# Patient Record
Sex: Female | Born: 1960
Health system: Southern US, Community
[De-identification: ages and names within clinical notes are randomized; demographics above are authoritative.]

## PROBLEM LIST (undated history)

## (undated) DIAGNOSIS — I509 Heart failure, unspecified: Secondary | ICD-10-CM

## (undated) DIAGNOSIS — I10 Essential (primary) hypertension: Secondary | ICD-10-CM

## (undated) DIAGNOSIS — J45909 Unspecified asthma, uncomplicated: Secondary | ICD-10-CM

## (undated) DIAGNOSIS — G4730 Sleep apnea, unspecified: Secondary | ICD-10-CM

## (undated) DIAGNOSIS — K219 Gastro-esophageal reflux disease without esophagitis: Secondary | ICD-10-CM

## (undated) DIAGNOSIS — K859 Acute pancreatitis without necrosis or infection, unspecified: Secondary | ICD-10-CM

## (undated) DIAGNOSIS — G709 Myoneural disorder, unspecified: Secondary | ICD-10-CM

## (undated) DIAGNOSIS — M329 Systemic lupus erythematosus, unspecified: Secondary | ICD-10-CM

## (undated) DIAGNOSIS — E785 Hyperlipidemia, unspecified: Secondary | ICD-10-CM

## (undated) DIAGNOSIS — J449 Chronic obstructive pulmonary disease, unspecified: Secondary | ICD-10-CM

## (undated) DIAGNOSIS — G43909 Migraine, unspecified, not intractable, without status migrainosus: Secondary | ICD-10-CM

## (undated) DIAGNOSIS — K8020 Calculus of gallbladder without cholecystitis without obstruction: Secondary | ICD-10-CM

## (undated) DIAGNOSIS — G459 Transient cerebral ischemic attack, unspecified: Secondary | ICD-10-CM

## (undated) DIAGNOSIS — IMO0002 Reserved for concepts with insufficient information to code with codable children: Secondary | ICD-10-CM

## (undated) DIAGNOSIS — E119 Type 2 diabetes mellitus without complications: Secondary | ICD-10-CM

## (undated) DIAGNOSIS — F329 Major depressive disorder, single episode, unspecified: Secondary | ICD-10-CM

## (undated) DIAGNOSIS — R0600 Dyspnea, unspecified: Secondary | ICD-10-CM

## (undated) DIAGNOSIS — M797 Fibromyalgia: Secondary | ICD-10-CM

## (undated) DIAGNOSIS — I2510 Atherosclerotic heart disease of native coronary artery without angina pectoris: Secondary | ICD-10-CM

## (undated) DIAGNOSIS — B029 Zoster without complications: Secondary | ICD-10-CM

## (undated) DIAGNOSIS — M810 Age-related osteoporosis without current pathological fracture: Secondary | ICD-10-CM

## (undated) DIAGNOSIS — G629 Polyneuropathy, unspecified: Secondary | ICD-10-CM

## (undated) DIAGNOSIS — Z973 Presence of spectacles and contact lenses: Secondary | ICD-10-CM

## (undated) DIAGNOSIS — F419 Anxiety disorder, unspecified: Secondary | ICD-10-CM

## (undated) DIAGNOSIS — I700 Atherosclerosis of aorta: Secondary | ICD-10-CM

## (undated) DIAGNOSIS — F32A Depression, unspecified: Secondary | ICD-10-CM

## (undated) DIAGNOSIS — R0902 Hypoxemia: Secondary | ICD-10-CM

## (undated) DIAGNOSIS — I639 Cerebral infarction, unspecified: Secondary | ICD-10-CM

## (undated) DIAGNOSIS — M1990 Unspecified osteoarthritis, unspecified site: Secondary | ICD-10-CM

## (undated) HISTORY — DX: Anxiety disorder, unspecified: F41.9

## (undated) HISTORY — DX: Age-related osteoporosis without current pathological fracture: M81.0

## (undated) HISTORY — DX: Fibromyalgia: M79.7

## (undated) HISTORY — DX: Migraine, unspecified, not intractable, without status migrainosus: G43.909

## (undated) HISTORY — DX: Myoneural disorder, unspecified: G70.9

## (undated) HISTORY — DX: Unspecified osteoarthritis, unspecified site: M19.90

## (undated) HISTORY — DX: Chronic obstructive pulmonary disease, unspecified: J44.9

## (undated) HISTORY — DX: Hypoxemia: R09.02

## (undated) HISTORY — DX: Depression, unspecified: F32.A

## (undated) HISTORY — DX: Cerebral infarction, unspecified: I63.9

## (undated) HISTORY — DX: Unspecified asthma, uncomplicated: J45.909

## (undated) HISTORY — DX: Major depressive disorder, single episode, unspecified: F32.9

## (undated) HISTORY — DX: Essential (primary) hypertension: I10

## (undated) HISTORY — DX: Type 2 diabetes mellitus without complications: E11.9

## (undated) HISTORY — DX: Acute pancreatitis without necrosis or infection, unspecified: K85.90

## (undated) HISTORY — DX: Calculus of gallbladder without cholecystitis without obstruction: K80.20

## (undated) HISTORY — DX: Zoster without complications: B02.9

## (undated) HISTORY — DX: Hyperlipidemia, unspecified: E78.5

---

## 1987-04-14 HISTORY — PX: ABDOMINAL HYSTERECTOMY: SHX81

## 1987-04-14 HISTORY — PX: APPENDECTOMY: SHX54

## 1987-04-14 HISTORY — PX: CHOLECYSTECTOMY: SHX55

## 1998-04-13 HISTORY — PX: TONSILLECTOMY: SUR1361

## 2015-04-14 DIAGNOSIS — I219 Acute myocardial infarction, unspecified: Secondary | ICD-10-CM

## 2015-04-14 HISTORY — DX: Acute myocardial infarction, unspecified: I21.9

## 2015-08-15 ENCOUNTER — Ambulatory Visit (INDEPENDENT_AMBULATORY_CARE_PROVIDER_SITE_OTHER): Payer: Commercial Managed Care - HMO | Admitting: Physician Assistant

## 2015-08-15 VITALS — BP 151/110 | HR 74 | Temp 98.1°F | Resp 18 | Ht 66.0 in | Wt 305.0 lb

## 2015-08-15 DIAGNOSIS — M1990 Unspecified osteoarthritis, unspecified site: Secondary | ICD-10-CM

## 2015-08-15 DIAGNOSIS — I10 Essential (primary) hypertension: Secondary | ICD-10-CM | POA: Diagnosis not present

## 2015-08-15 DIAGNOSIS — M5136 Other intervertebral disc degeneration, lumbar region: Secondary | ICD-10-CM

## 2015-08-15 DIAGNOSIS — M797 Fibromyalgia: Secondary | ICD-10-CM | POA: Diagnosis not present

## 2015-08-15 DIAGNOSIS — M810 Age-related osteoporosis without current pathological fracture: Secondary | ICD-10-CM | POA: Diagnosis not present

## 2015-08-15 DIAGNOSIS — G6289 Other specified polyneuropathies: Secondary | ICD-10-CM | POA: Diagnosis not present

## 2015-08-15 DIAGNOSIS — R252 Cramp and spasm: Secondary | ICD-10-CM | POA: Diagnosis not present

## 2015-08-15 DIAGNOSIS — I2510 Atherosclerotic heart disease of native coronary artery without angina pectoris: Secondary | ICD-10-CM | POA: Insufficient documentation

## 2015-08-15 DIAGNOSIS — M1712 Unilateral primary osteoarthritis, left knee: Secondary | ICD-10-CM | POA: Diagnosis not present

## 2015-08-15 DIAGNOSIS — I25119 Atherosclerotic heart disease of native coronary artery with unspecified angina pectoris: Secondary | ICD-10-CM

## 2015-08-15 DIAGNOSIS — Z9189 Other specified personal risk factors, not elsewhere classified: Secondary | ICD-10-CM | POA: Diagnosis not present

## 2015-08-15 DIAGNOSIS — G622 Polyneuropathy due to other toxic agents: Secondary | ICD-10-CM

## 2015-08-15 DIAGNOSIS — K219 Gastro-esophageal reflux disease without esophagitis: Secondary | ICD-10-CM

## 2015-08-15 DIAGNOSIS — I152 Hypertension secondary to endocrine disorders: Secondary | ICD-10-CM | POA: Insufficient documentation

## 2015-08-15 DIAGNOSIS — Z8639 Personal history of other endocrine, nutritional and metabolic disease: Secondary | ICD-10-CM | POA: Diagnosis not present

## 2015-08-15 DIAGNOSIS — J449 Chronic obstructive pulmonary disease, unspecified: Secondary | ICD-10-CM | POA: Diagnosis not present

## 2015-08-15 DIAGNOSIS — G629 Polyneuropathy, unspecified: Secondary | ICD-10-CM | POA: Insufficient documentation

## 2015-08-15 DIAGNOSIS — L603 Nail dystrophy: Secondary | ICD-10-CM

## 2015-08-15 DIAGNOSIS — Z575 Occupational exposure to toxic agents in other industries: Secondary | ICD-10-CM

## 2015-08-15 DIAGNOSIS — M51369 Other intervertebral disc degeneration, lumbar region without mention of lumbar back pain or lower extremity pain: Secondary | ICD-10-CM

## 2015-08-15 LAB — CBC WITH DIFFERENTIAL/PLATELET
Basophils Absolute: 0 cells/uL (ref 0–200)
Basophils Relative: 0 %
Eosinophils Absolute: 168 cells/uL (ref 15–500)
Eosinophils Relative: 2 %
HCT: 42.5 % (ref 35.0–45.0)
Hemoglobin: 14.4 g/dL (ref 11.7–15.5)
Lymphocytes Relative: 45 %
Lymphs Abs: 3780 cells/uL (ref 850–3900)
MCH: 28.4 pg (ref 27.0–33.0)
MCHC: 33.9 g/dL (ref 32.0–36.0)
MCV: 83.8 fL (ref 80.0–100.0)
MPV: 10.7 fL (ref 7.5–12.5)
Monocytes Absolute: 672 cells/uL (ref 200–950)
Monocytes Relative: 8 %
Neutro Abs: 3780 cells/uL (ref 1500–7800)
Neutrophils Relative %: 45 %
Platelets: 317 10*3/uL (ref 140–400)
RBC: 5.07 MIL/uL (ref 3.80–5.10)
RDW: 14.3 % (ref 11.0–15.0)
WBC: 8.4 10*3/uL (ref 3.8–10.8)

## 2015-08-15 LAB — COMPREHENSIVE METABOLIC PANEL
ALT: 29 U/L (ref 6–29)
AST: 45 U/L — ABNORMAL HIGH (ref 10–35)
Albumin: 4 g/dL (ref 3.6–5.1)
Alkaline Phosphatase: 100 U/L (ref 33–130)
BUN: 11 mg/dL (ref 7–25)
CO2: 30 mmol/L (ref 20–31)
Calcium: 9 mg/dL (ref 8.6–10.4)
Chloride: 101 mmol/L (ref 98–110)
Creat: 0.73 mg/dL (ref 0.50–1.05)
Glucose, Bld: 104 mg/dL — ABNORMAL HIGH (ref 65–99)
Potassium: 3.4 mmol/L — ABNORMAL LOW (ref 3.5–5.3)
Sodium: 143 mmol/L (ref 135–146)
Total Bilirubin: 0.3 mg/dL (ref 0.2–1.2)
Total Protein: 7.1 g/dL (ref 6.1–8.1)

## 2015-08-15 MED ORDER — CLONIDINE HCL 0.3 MG PO TABS: 0.3000 mg | ORAL_TABLET | Freq: Every day | ORAL | Status: DC

## 2015-08-15 MED ORDER — POTASSIUM CHLORIDE CRYS ER 20 MEQ PO TBCR: 20.0000 meq | EXTENDED_RELEASE_TABLET | Freq: Every day | ORAL | Status: DC

## 2015-08-15 MED ORDER — HYDROCODONE-ACETAMINOPHEN 7.5-325 MG PO TABS: 1.0000 | ORAL_TABLET | Freq: Four times a day (QID) | ORAL | Status: DC | PRN

## 2015-08-15 MED ORDER — GABAPENTIN 800 MG PO TABS: 800.0000 mg | ORAL_TABLET | Freq: Three times a day (TID) | ORAL | Status: DC

## 2015-08-15 NOTE — Progress Notes (Signed)
Patient ID: Tammy Strickland, female     DOB: 11-23-1960, 55 y.o.    MRN: [id number]  PCP: No primary care provider on file.  Chief Complaint  Patient presents with  . Generalized Body Aches  . Knee Pain    left  . Foot Swelling    both, pain    Subjective:    HPI  Presents for evaluation of pain. She moved to [place] from [place], [place] in February to help her daughter. She reports that she has been out of several medications since then, including gabapentin, potassium, clonidine.  The patient provided all her history, without reports, results or previous records available for review.  This is her first visit here. She has an insurance card listing Ellison Carwin, MD as her PCP. Dr. Ouida Sills left this practice in January 2017.  1. Referral to [facility] so that she can continue with evaluation and treatment of DJD of the LEFT knee and possible TKR. She went there this morning but was told that she required a referral. She has had a previous injection. She reports that she has bone-on-bone disease in the LEFT knee.    2. "My bones is killing me." Especially in my feet, too. It hurts to walk. The foot pain is worst when she first stands. It eases off some, but doesn't resolve. The heels DO NOT HURT. The pain is in the "bones" of her feet, from the ball of the foot forward. "I am going to need a walker." Curently does not use an assistive device.  3. Calf pain, cramping. Bilateral. As long as her knee has been "messed up. Years." A few months ago, became much worse.  Reports that the meloxicam "isn't worth a dime." Uses tizanidine for terrible muscle spasms in her back. Occur 5-6 times/day. So bad that she has to grip something and do deep breathing techniques for 5-6 minutes until the symptoms resolve. Reports previous use of oxycodone 10, morphine 50 mg.  [place] reviewed. The only controlled substance Rx listed is a codeine containing cough preparation 06/18/2015  prescribed by Dr. Ulice Brilliant in [place] and filled at the [facility] there.   No Known Allergies   Patient Active Problem List   Diagnosis Date Noted  . Benign essential HTN 08/15/2015  . Fibromyalgia 08/15/2015  . Peripheral neuropathy ([place]) 08/15/2015  . Arthritis 08/15/2015  . CAD (coronary artery disease) 08/15/2015  . COPD (chronic obstructive pulmonary disease) ([place]) 08/15/2015  . History of diabetes mellitus 08/15/2015  . Osteoporosis 08/15/2015  . Left knee DJD 08/15/2015    Past Medical History  Diagnosis Date  . Anxiety   . Arthritis   . COPD (chronic obstructive pulmonary disease) ([place])   . Depression   . Diabetes mellitus without complication ([place])   . Hypertension   . Neuromuscular disorder ([place])   . Osteoporosis      Family History  Problem Relation Age of Onset  . Hyperlipidemia Mother   . Hypertension Mother   . Hyperlipidemia Father   . Hypertension Father   . Stroke Father   . Hypertension Sister   . Cancer Sister     breast cancer  . Crohn's disease Sister      Social History   Social History  . Marital Status: Single    Spouse Name: n/a  . Number of Children: 3  . Years of Education: 12+   Occupational History  . 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 Smoking status: Current Every Day Smoker -- 0.30 packs/day    Types: Cigarettes  . Smokeless tobacco: Never Used  . Alcohol Use: 1.2 - 1.8 oz/week    2-3 Standard drinks or equivalent per week  . Drug Use: No  . Sexual Activity:    Partners: Female   Other Topics Concern  . Not on file   Social History Narrative   Moved to [place] from [place], [place] February 2017, to help her daughter.   Lives with her daughter.   Sons live in [place] and [place].        Review of Systems  Constitutional: Negative.   Eyes: Negative.   Respiratory: Negative.   Cardiovascular:  Negative.   Gastrointestinal: Negative.   Endocrine: Negative.   Genitourinary: Negative.   Musculoskeletal: Positive for myalgias, back pain, joint swelling, arthralgias and gait problem. Negative for neck pain and neck stiffness.  Skin: Negative.   Allergic/Immunologic: Negative.   Neurological: Positive for headaches (when blood pressure is elevated). Negative for dizziness, tremors, seizures, syncope, facial asymmetry, speech difficulty, weakness, light-headedness and numbness.  Hematological: Negative.   Psychiatric/Behavioral: Positive for sleep disturbance (due to pain). Negative for self-injury and dysphoric mood. The patient is not nervous/anxious.          Objective:  Physical Exam  Constitutional: She is oriented to person, place, and time. She appears well-developed and well-nourished. She is active and cooperative. No distress.  BP 151/110 mmHg  Pulse 74  Temp(Src) 98.1 F (36.7 C)  Resp 18  Ht 5\' 6"  (1.676 m)  Wt 305 lb (138.347 kg)  BMI 49.25 kg/m2  SpO2 95% Of note, she has not taken her BP medication yet this morning.  HENT:  Head: Normocephalic and atraumatic.  Right Ear: Hearing normal.  Left Ear: Hearing normal.  Eyes: Conjunctivae are normal. No scleral icterus.  Neck: Normal range of motion. Neck supple. No thyromegaly present.  Cardiovascular: Normal rate, regular rhythm and normal heart sounds.   Pulses:      Radial pulses are 2+ on the right side, and 2+ on the left side.  Pulmonary/Chest: Effort normal and breath sounds normal.  Musculoskeletal:       Right knee: Normal.       Left knee: She exhibits swelling (mild, though patient reports it is bad). She exhibits no ecchymosis. Tenderness (generalized) found.  Lymphadenopathy:       Head (right side): No tonsillar, no preauricular, no posterior auricular and no occipital adenopathy present.       Head (left side): No tonsillar, no preauricular, no posterior auricular and no occipital adenopathy  present.    She has no cervical adenopathy.       Right: No supraclavicular adenopathy present.       Left: No supraclavicular adenopathy present.  Neurological: She is alert and oriented to person, place, and time. No sensory deficit.  Skin: Skin is warm, dry and intact. No rash noted. No cyanosis or erythema. Nails show no clubbing.  Toenails are thickened and discolored, some are curved consistent with pressure from footwear. Fingernails L>R are discolored.  Psychiatric: She has a normal mood and affect. Her speech is normal and behavior is normal. Judgment and thought content normal. Cognition and memory are normal.             Assessment & Plan:  1. Benign essential HTN Uncontrolled. Resume clonidine. She took a dose of her med here today. I'm  not clear why she takes losartan BID, nor why the clonidine is scheduled but only QD. - CBC with Differential/Platelet - Comprehensive metabolic panel - potassium chloride SA (K-DUR,KLOR-CON) 20 MEQ tablet; Take 1 tablet (20 mEq total) by mouth daily. Reported on 08/15/2015  Dispense: 30 tablet; Refill: 0 - cloNIDine (CATAPRES) 0.3 MG tablet; Take 1 tablet (0.3 mg total) by mouth daily. Reported on 08/15/2015  Dispense: 30 tablet; Refill: 0  2. Fibromyalgia Continue her current regimen.   3. 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 Occupational exposure to industrial toxins Restart gabapentin. - gabapentin (NEURONTIN) 800 MG tablet; Take 1 tablet (800 mg total) by mouth 3 (three) times daily.  Dispense: 90 tablet; Refill: 0  4. Arthritis Unclear what specific areas are affected other than the L-spine and LEFT knee.  5. Coronary artery disease involving native coronary artery of native heart with angina pectoris ([place]) Continue management of HTN and healthy lifestyle changes. May need to establish with local cardiology.  6. Chronic obstructive pulmonary disease, unspecified COPD type ([place]) Asymptomatic. Encouraged smoking  cessation.  7. History of diabetes mellitus  8. Osteoporosis  9. Primary osteoarthritis of left knee Continue meloxicam. Add Norco short-term for pain relief until she can get in with orthopedics locally. - Ambulatory referral to Orthopedic Surgery - HYDROcodone-acetaminophen (NORCO) 7.5-325 MG tablet; Take 1 tablet by mouth every 6 (six) hours as needed.  Dispense: 30 tablet; Refill: 0  10. DDD (degenerative disc disease), lumbar - Ambulatory referral to Orthopedic Surgery  12. Muscle cramps Await CMET. Continue current treatment. - Comprehensive metabolic panel  13. Gastroesophageal reflux disease, esophagitis presence not specified Continue omeprazole.  14. Dystrophic nail - Ambulatory referral to Podiatry  For all of these issues, she will need to follow-up with a PCP. Our practice is not currently accepting new patients, and Dr. Ouida Sills is no longer here. A list of providers accepting new patients was provided.   Fara Chute, PA-C Physician Assistant-Certified [facility]

## 2015-08-15 NOTE — Patient Instructions (Addendum)
     IF you received an x-ray today, you will receive an invoice from [facility]. Please contact [facility] at [phone] with questions or concerns regarding your invoice.   IF you received labwork today, you will receive an invoice from [employer]. Please contact [facility] at [phone] with questions or concerns regarding your invoice.   Our billing staff will not be able to assist you with questions regarding bills from these companies.  You will be contacted with the lab results as soon as they are available. The fastest way to get your results is to activate your My Chart account. Instructions are located on the last page of this paperwork. If you have not heard from [place] regarding the results in 2 weeks, please contact this office.    The following providers at the following offices in our health system are open to new patients   [place] [address]. Parks Ranger Dr. Billey Gosling Dr. Azucena Freed, FNP  [place] Dorothyann Peng, AGNP Dr. Garret Reddish  Dr. Betty Martinique   [facility] Dr. Penni Homans  Dr. Garnet K[place] Dr. Kathlene November Debbrah Alar, FNP Elyn Aquas, PA-C  Mackie Pai, PA-C  Dr. Lorenza Evangelist (starts in July 2017)  [place] Dr. Dimple Nanas  [facility] Dr. Crissie Sickles  Dr. Howard Pouch   [place] [facility] Dr. Trevor Mace, AGNP Webb Silversmith, FNP  [facility] Dr. Thersa Salt  Dr. Tommi Rumps  Lorane Gell, AGNP

## 2015-08-21 ENCOUNTER — Telehealth: Payer: Self-pay | Admitting: Physician Assistant

## 2015-08-21 DIAGNOSIS — M76892 Other specified enthesopathies of left lower limb, excluding foot: Secondary | ICD-10-CM | POA: Diagnosis not present

## 2015-08-21 DIAGNOSIS — M170 Bilateral primary osteoarthritis of knee: Secondary | ICD-10-CM | POA: Diagnosis not present

## 2015-08-21 DIAGNOSIS — M1711 Unilateral primary osteoarthritis, right knee: Secondary | ICD-10-CM | POA: Diagnosis not present

## 2015-08-21 DIAGNOSIS — M1712 Unilateral primary osteoarthritis, left knee: Secondary | ICD-10-CM | POA: Diagnosis not present

## 2015-08-21 NOTE — Telephone Encounter (Signed)
There are referrals that have been placed. Podiatry and Orthopedics. [place]?

## 2015-08-21 NOTE — Telephone Encounter (Signed)
Patient request a referral to Pain Management. Patient is complaining of left leg pain [phone].

## 2015-08-21 NOTE — Telephone Encounter (Signed)
Pian management, she was only seen once.

## 2015-08-22 NOTE — Telephone Encounter (Signed)
I believe they were waiting on [facility] authorization before they would schedule.  I did not see it because it was not sent as a direct message.  I have now completed the [facility] authorization as requested.  She can either call them to schedule or they will reach out to her.  [place] (Dr Jacqualyn Posey): [phone]

## 2015-08-22 NOTE — Telephone Encounter (Signed)
Pt advised.

## 2015-08-22 NOTE — Telephone Encounter (Signed)
I referred her to the specialist she requested and to podiatry for the problem we also discussed in her feet.  I think that if the specialists do not have a plan that addresses her pain, pain management is appropriate.  If she was seeing pain management previously, we need to get those records and I'm happy to send her to a local pain clinic.

## 2015-08-22 NOTE — Telephone Encounter (Signed)
Spoke with pt, she is not getting a call back from the Podiatry. Can we check on this for her?

## 2015-08-26 ENCOUNTER — Other Ambulatory Visit: Payer: Self-pay

## 2015-08-26 DIAGNOSIS — G622 Polyneuropathy due to other toxic agents: Secondary | ICD-10-CM

## 2015-08-26 DIAGNOSIS — I10 Essential (primary) hypertension: Secondary | ICD-10-CM

## 2015-08-26 DIAGNOSIS — M1712 Unilateral primary osteoarthritis, left knee: Secondary | ICD-10-CM

## 2015-08-26 NOTE — Telephone Encounter (Signed)
Pt is needing a refill on pain meds for the left leg and is very swollen and ankle too  Best number [phone]

## 2015-08-27 MED ORDER — HYDROCODONE-ACETAMINOPHEN 7.5-325 MG PO TABS: 1.0000 | ORAL_TABLET | Freq: Four times a day (QID) | ORAL | Status: DC | PRN

## 2015-08-27 NOTE — Telephone Encounter (Signed)
Rx printed at 104. Will bring to 102 after clinic.  Meds ordered this encounter  Medications  . HYDROcodone-acetaminophen (NORCO) 7.5-325 MG tablet    Sig: Take 1 tablet by mouth every 6 (six) hours as needed.    Dispense:  30 tablet    Refill:  0    Order Specific Question:  Supervising Provider    Answer:  Laney Pastor, ROBERT P [id number]    When is her appointment with orthopedics? (She has DJD of the knee). We send the notes to [place] on 5/5.  Has she contacted any of the offices I gave her to establish for primary care?  If the swelling persists, she may need to be seen before the ortho visit (if its not for several weeks).

## 2015-08-28 ENCOUNTER — Encounter: Payer: Self-pay | Admitting: Physician Assistant

## 2015-08-29 MED ORDER — LOSARTAN POTASSIUM 50 MG PO TABS: 50.0000 mg | ORAL_TABLET | Freq: Two times a day (BID) | ORAL | Status: DC

## 2015-08-29 MED ORDER — METOPROLOL SUCCINATE ER 200 MG PO TB24: 200.0000 mg | ORAL_TABLET | Freq: Every day | ORAL | Status: DC

## 2015-08-29 MED ORDER — MELOXICAM 15 MG PO TABS: 15.0000 mg | ORAL_TABLET | Freq: Every day | ORAL | Status: DC

## 2015-08-29 MED ORDER — CLONIDINE HCL 0.3 MG PO TABS: 0.3000 mg | ORAL_TABLET | Freq: Every day | ORAL | Status: DC

## 2015-08-29 MED ORDER — TIZANIDINE HCL 4 MG PO TABS: 10.0000 mg | ORAL_TABLET | Freq: Two times a day (BID) | ORAL | Status: DC | PRN

## 2015-08-29 MED ORDER — GABAPENTIN 800 MG PO TABS: 800.0000 mg | ORAL_TABLET | Freq: Three times a day (TID) | ORAL | Status: DC

## 2015-08-29 MED ORDER — POTASSIUM CHLORIDE CRYS ER 20 MEQ PO TBCR: 20.0000 meq | EXTENDED_RELEASE_TABLET | Freq: Every day | ORAL | Status: DC

## 2015-08-29 MED ORDER — MIRTAZAPINE 30 MG PO TABS: 30.0000 mg | ORAL_TABLET | Freq: Every day | ORAL | Status: DC

## 2015-08-29 MED ORDER — OMEPRAZOLE 20 MG PO CPDR: 20.0000 mg | DELAYED_RELEASE_CAPSULE | Freq: Every day | ORAL | Status: DC

## 2015-08-29 MED ORDER — TRAZODONE HCL 50 MG PO TABS: 50.0000 mg | ORAL_TABLET | Freq: Every day | ORAL | Status: DC

## 2015-08-29 NOTE — Telephone Encounter (Signed)
Meds ordered this encounter  Medications  . HYDROcodone-acetaminophen (NORCO) 7.5-325 MG tablet    Sig: Take 1 tablet by mouth every 6 (six) hours as needed.    Dispense:  30 tablet    Refill:  0    Order Specific Question:  Supervising Provider    Answer:  DOOLITTLE, ROBERT P [id number]  . cloNIDine (CATAPRES) 0.3 MG tablet    Sig: Take 1 tablet (0.3 mg total) by mouth daily. Reported on 08/15/2015    Dispense:  90 tablet    Refill:  0  . gabapentin (NEURONTIN) 800 MG tablet    Sig: Take 1 tablet (800 mg total) by mouth 3 (three) times daily.    Dispense:  270 tablet    Refill:  0  . losartan (COZAAR) 50 MG tablet    Sig: Take 1 tablet (50 mg total) by mouth 2 (two) times daily.    Dispense:  180 tablet    Refill:  0  . metoprolol (TOPROL-XL) 200 MG 24 hr tablet    Sig: Take 1 tablet (200 mg total) by mouth daily.    Dispense:  90 tablet    Refill:  0  . mirtazapine (REMERON) 30 MG tablet    Sig: Take 1 tablet (30 mg total) by mouth at bedtime.    Dispense:  90 tablet    Refill:  0  . tiZANidine (ZANAFLEX) 4 MG tablet    Sig: Take 2.5 tablets (10 mg total) by mouth 2 (two) times daily as needed.    Dispense:  450 tablet    Refill:  0  . traZODone (DESYREL) 50 MG tablet    Sig: Take 1 tablet (50 mg total) by mouth at bedtime.    Dispense:  90 tablet    Refill:  0  . meloxicam (MOBIC) 15 MG tablet    Sig: Take 1 tablet (15 mg total) by mouth daily.    Dispense:  90 tablet    Refill:  0  . omeprazole (PRILOSEC) 20 MG capsule    Sig: Take 1 capsule (20 mg total) by mouth daily.    Dispense:  90 capsule    Refill:  0  . potassium chloride SA (K-DUR,KLOR-CON) 20 MEQ tablet    Sig: Take 1 tablet (20 mEq total) by mouth daily. Reported on 08/15/2015    Dispense:  90 tablet    Refill:  0

## 2015-08-29 NOTE — Addendum Note (Signed)
Addended by: Fara Chute on: 08/29/2015 06:04 PM   Modules accepted: Orders

## 2015-08-29 NOTE — Telephone Encounter (Signed)
I did ask pt about appt w/new PCP and she reported that she has one scheduled at [facility] on 6/29. I received a RF req for many chronic meds from [facility] mail order and pt stated that she will run out of them all before her appt and asked that [facility] fill them until then. Pt stated that she should have enough nitro so will not need a RF of that. The others I have pended for review.

## 2015-08-29 NOTE — Telephone Encounter (Signed)
Notified pt Rx is ready. She thanked [place] and reported that she sees the ortho this coming Mon, and then again on 6/7. She also said she has appt w/Podiatrist on 6/7.

## 2015-08-29 NOTE — Addendum Note (Signed)
Addended by: Elwyn Reach A on: 08/29/2015 10:23 AM   Modules accepted: Orders

## 2015-09-02 DIAGNOSIS — M1712 Unilateral primary osteoarthritis, left knee: Secondary | ICD-10-CM | POA: Diagnosis not present

## 2015-09-05 DIAGNOSIS — M1712 Unilateral primary osteoarthritis, left knee: Secondary | ICD-10-CM | POA: Diagnosis not present

## 2015-09-13 DIAGNOSIS — M1712 Unilateral primary osteoarthritis, left knee: Secondary | ICD-10-CM | POA: Diagnosis not present

## 2015-09-17 DIAGNOSIS — M1712 Unilateral primary osteoarthritis, left knee: Secondary | ICD-10-CM | POA: Diagnosis not present

## 2015-09-18 DIAGNOSIS — M1712 Unilateral primary osteoarthritis, left knee: Secondary | ICD-10-CM | POA: Diagnosis not present

## 2015-09-18 DIAGNOSIS — M76892 Other specified enthesopathies of left lower limb, excluding foot: Secondary | ICD-10-CM | POA: Diagnosis not present

## 2015-09-19 DIAGNOSIS — M1712 Unilateral primary osteoarthritis, left knee: Secondary | ICD-10-CM | POA: Diagnosis not present

## 2015-09-20 ENCOUNTER — Ambulatory Visit (INDEPENDENT_AMBULATORY_CARE_PROVIDER_SITE_OTHER): Payer: Commercial Managed Care - HMO | Admitting: Podiatry

## 2015-09-20 ENCOUNTER — Ambulatory Visit (INDEPENDENT_AMBULATORY_CARE_PROVIDER_SITE_OTHER): Payer: Commercial Managed Care - HMO

## 2015-09-20 ENCOUNTER — Encounter: Payer: Self-pay | Admitting: Podiatry

## 2015-09-20 DIAGNOSIS — E1149 Type 2 diabetes mellitus with other diabetic neurological complication: Secondary | ICD-10-CM | POA: Diagnosis not present

## 2015-09-20 DIAGNOSIS — M79675 Pain in left toe(s): Secondary | ICD-10-CM | POA: Diagnosis not present

## 2015-09-20 DIAGNOSIS — M19079 Primary osteoarthritis, unspecified ankle and foot: Secondary | ICD-10-CM

## 2015-09-20 DIAGNOSIS — M2142 Flat foot [pes planus] (acquired), left foot: Secondary | ICD-10-CM | POA: Diagnosis not present

## 2015-09-20 DIAGNOSIS — B351 Tinea unguium: Secondary | ICD-10-CM

## 2015-09-20 DIAGNOSIS — R52 Pain, unspecified: Secondary | ICD-10-CM | POA: Diagnosis not present

## 2015-09-20 DIAGNOSIS — M2141 Flat foot [pes planus] (acquired), right foot: Secondary | ICD-10-CM

## 2015-09-20 DIAGNOSIS — M79674 Pain in right toe(s): Secondary | ICD-10-CM

## 2015-09-20 MED ORDER — NONFORMULARY OR COMPOUNDED ITEM: Status: DC

## 2015-09-20 NOTE — Progress Notes (Signed)
   Subjective:    Patient ID: Tammy Strickland, female    DOB: 1960-10-02, 55 y.o.   MRN: [id number]  HPI  55 year old female presents the office today for concerns of thick, painful, elongated toenails that she cannot trim herself. Chest states that she gets burning pain to both of her feet from neuropathy and she is taking gabapentin. No recent injury. No swelling or redness. She also states that she has arthritis in her feet. No other complaints.   Review of Systems  All other systems reviewed and are negative.      Objective:   Physical Exam General: AAO x3, NAD  Dermatological: Nails are hypertrophic, dystrophic, brittle, discolored, elongated 10. There is no swelling redness or drainage. Tenderness to nails 1-5 bilaterally. No open lesions or pills and lesions.  Vascular: DP/PT 2/4, CRT less than 3 seconds There is no pain with calf compression, swelling, warmth, erythema.   Neruologic: sensation decreased with Derrel Nip monofilament, decreased vibratory sensation.   Musculoskeletahammertoes are present as well as flatfoot deformity. There is no pain, crepitus, or limitation noted withankle range of motion bilateral. There is mild midfoot diffuse tenderness dorsally however there is no specific area of tenderness. Muscular strength 5/5 in all groups tested bilateral.  Gait: Unassisted, Nonantalgic.      Assessment & Plan:   55 year old female symptomatically onychomycosis, flatfoot deformity with osteophyte arthritis  -Treatment options discussed including all alternatives, risks, and complications -Etiology of symptoms were discussed -X-rays were obtained and reviewed with the patient. Midfoot arthritic changes present. No evidence of acute fracture.  -Nails debrided 10 without complications or bleeding  -Discussed daily foot inspection  -Recommend diabetic shoes and inserts. Paperwork was completed for precertification.  -Follow-up in 3 months or sooner if any  problems arise. In the meantime, encouraged to call the office with any questions, concerns, change in symptoms.   Celesta Gentile, DPM

## 2015-09-24 DIAGNOSIS — M1712 Unilateral primary osteoarthritis, left knee: Secondary | ICD-10-CM | POA: Diagnosis not present

## 2015-09-27 DIAGNOSIS — M1712 Unilateral primary osteoarthritis, left knee: Secondary | ICD-10-CM | POA: Diagnosis not present

## 2015-10-10 ENCOUNTER — Ambulatory Visit (INDEPENDENT_AMBULATORY_CARE_PROVIDER_SITE_OTHER): Payer: Commercial Managed Care - HMO | Admitting: Internal Medicine

## 2015-10-10 ENCOUNTER — Encounter: Payer: Self-pay | Admitting: Internal Medicine

## 2015-10-10 VITALS — BP 150/108 | HR 60 | Temp 98.2°F | Resp 18 | Ht 66.5 in | Wt 308.0 lb

## 2015-10-10 DIAGNOSIS — I10 Essential (primary) hypertension: Secondary | ICD-10-CM

## 2015-10-10 DIAGNOSIS — K219 Gastro-esophageal reflux disease without esophagitis: Secondary | ICD-10-CM

## 2015-10-10 DIAGNOSIS — R7303 Prediabetes: Secondary | ICD-10-CM

## 2015-10-10 DIAGNOSIS — J449 Chronic obstructive pulmonary disease, unspecified: Secondary | ICD-10-CM

## 2015-10-10 DIAGNOSIS — I25119 Atherosclerotic heart disease of native coronary artery with unspecified angina pectoris: Secondary | ICD-10-CM | POA: Diagnosis not present

## 2015-10-10 DIAGNOSIS — F329 Major depressive disorder, single episode, unspecified: Secondary | ICD-10-CM

## 2015-10-10 DIAGNOSIS — M5442 Lumbago with sciatica, left side: Secondary | ICD-10-CM

## 2015-10-10 DIAGNOSIS — Z8639 Personal history of other endocrine, nutritional and metabolic disease: Secondary | ICD-10-CM

## 2015-10-10 DIAGNOSIS — F32A Depression, unspecified: Secondary | ICD-10-CM | POA: Insufficient documentation

## 2015-10-10 DIAGNOSIS — G6289 Other specified polyneuropathies: Secondary | ICD-10-CM

## 2015-10-10 DIAGNOSIS — G4700 Insomnia, unspecified: Secondary | ICD-10-CM

## 2015-10-10 MED ORDER — CLONIDINE HCL 0.3 MG PO TABS: 0.3000 mg | ORAL_TABLET | Freq: Every day | ORAL | Status: DC

## 2015-10-10 MED ORDER — POTASSIUM CHLORIDE CRYS ER 20 MEQ PO TBCR: 20.0000 meq | EXTENDED_RELEASE_TABLET | Freq: Every day | ORAL | Status: DC

## 2015-10-10 MED ORDER — METOPROLOL SUCCINATE ER 200 MG PO TB24: 200.0000 mg | ORAL_TABLET | Freq: Every day | ORAL | Status: DC

## 2015-10-10 MED ORDER — FUROSEMIDE 80 MG PO TABS: 80.0000 mg | ORAL_TABLET | Freq: Every day | ORAL | Status: DC

## 2015-10-10 MED ORDER — AMITRIPTYLINE HCL 25 MG PO TABS
ORAL_TABLET | ORAL | Status: DC
Start: 2015-10-10 — End: 2015-11-08

## 2015-10-10 MED ORDER — IPRATROPIUM-ALBUTEROL 20-100 MCG/ACT IN AERS: 1.0000 | INHALATION_SPRAY | Freq: Four times a day (QID) | RESPIRATORY_TRACT | Status: DC

## 2015-10-10 NOTE — Patient Instructions (Signed)
  Test(s) ordered today. Your results will be released to [place] (or called to you) after review, usually within 72hours after test completion. If any changes need to be made, you will be notified at that same time.   Medications reviewed and updated.  Changes include discontinuing wellbutrin and remeron.  Starting amitriptyline at night - take this as directed.   Your prescription(s) have been submitted to your pharmacy. Please take as directed and contact our office if you believe you are having problem(s) with the medication(s).  A referral was ordered for pain management  Please followup in 4 weeks

## 2015-10-10 NOTE — Progress Notes (Signed)
Pre visit review using our clinic review tool, if applicable. No additional management support is needed unless otherwise documented below in the visit note. 

## 2015-10-10 NOTE — Progress Notes (Signed)
Subjective:    Patient ID: Tammy Strickland, female    DOB: 01-02-1961, 55 y.o.   MRN: [id number]  HPI She is here to establish with a new pcp.    She follows with orthopedics for her left knee arthritis.  She is doing PT and will eventually need a TKR.  Chronic back pain:  She was seeing orthopedics.  She was having injections, but stopped them because although it helped it was causing her more pain.  She had a nerve ablation.  She states she has herniated discs in her lumbar spine. She would like something for pain.    CAD, Hypertension: She is taking her medication daily, but did not take them today. She is compliant with a low sodium diet.  She denies chest pain, palpitations, but has chronic edema and sob. She is not exercising regularly.  She does monitor her blood pressure at home and it is controlled if she takes he rmedication.    GERD:  She is taking her medication daily as prescribed.  She denies any GERD symptoms and feels her GERD is well controlled.   COPD:  She is smoking, but trying to quit.  She was on oxygen for a year and a half, but has been off of it for the past 1 year.  She did a sleep apnea test and she was taken off her oxygen at night.    Prediabetes:  She is not compliant with a low sugar/carbohydrate diet.  She is not exercising regularly due to her knee pain and back pain.  Depression: She is taking her medication daily as prescribed. She denies any side effects from the medication. She feels her depression is well controlled and she is happy with her current dose of medication.  She is currently taking wellbutrin and remeron.   Insomnia:  She takes remeron, tizanidine and trazodone at night.  She still does not sleep.  She does not think the remeron does anything.  She was on amitriptyline in the past and that helped more.      Medications and allergies reviewed with patient and updated if appropriate.  Patient Active Problem List   Diagnosis Date Noted    . Benign essential HTN 08/15/2015  . Fibromyalgia 08/15/2015  . Peripheral neuropathy ([place]) 08/15/2015  . Arthritis 08/15/2015  . CAD (coronary artery disease) 08/15/2015  . COPD (chronic obstructive pulmonary disease) ([place]) 08/15/2015  . History of diabetes mellitus 08/15/2015  . Osteoporosis 08/15/2015  . 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 DDD (degenerative disc disease), lumbar 08/15/2015  . Occupational exposure to industrial toxins 08/15/2015  . GERD (gastroesophageal reflux disease) 08/15/2015    Current Outpatient Prescriptions on File Prior to Visit  Medication Sig Dispense Refill  . buPROPion (WELLBUTRIN XL) 150 MG 24 hr tablet Take 150 mg by mouth daily. Reported on 08/15/2015    . cloNIDine (CATAPRES) 0.3 MG tablet Take 1 tablet (0.3 mg total) by mouth daily. Reported on 08/15/2015 90 tablet 0  . fluticasone (FLONASE) 50 MCG/ACT nasal spray Place into both nostrils daily.    . furosemide (LASIX) 80 MG tablet Take 80 mg by mouth.    . gabapentin (NEURONTIN) 800 MG tablet Take 1 tablet (800 mg total) by mouth 3 (three) times daily. 270 tablet 0  . hydrOXYzine (VISTARIL) 25 MG capsule Take 25 mg by mouth 3 (three) times daily. Reported on 08/15/2015    . losartan (COZAAR) 50 MG tablet Take 1 tablet (50 mg total) by mouth  2 (two) times daily. 180 tablet 0  . meloxicam (MOBIC) 15 MG tablet Take 1 tablet (15 mg total) by mouth daily. 90 tablet 0  . metoprolol (TOPROL-XL) 200 MG 24 hr tablet Take 1 tablet (200 mg total) by mouth daily. 90 tablet 0  . mirtazapine (REMERON) 30 MG tablet Take 1 tablet (30 mg total) by mouth at bedtime. 90 tablet 0  . nitroGLYCERIN (NITRODUR - DOSED IN MG/24 HR) 0.4 mg/hr patch Place 0.4 mg onto the skin as needed.    . NONFORMULARY OR COMPOUNDED ITEM [facility]:  Onychomycosis Nail Lacquer - Fluconazole 2%, Terbinafine 1%, DMSO, apply to affected area daily. 120 each 2  . omeprazole (PRILOSEC) 20 MG capsule Take 1 capsule (20 mg total) by mouth daily. 90  capsule 0  . potassium chloride SA (K-DUR,KLOR-CON) 20 MEQ tablet Take 1 tablet (20 mEq total) by mouth daily. Reported on 08/15/2015 90 tablet 0  . tiZANidine (ZANAFLEX) 4 MG tablet Take 2.5 tablets (10 mg total) by mouth 2 (two) times daily as needed. 450 tablet 0  . traZODone (DESYREL) 50 MG tablet Take 1 tablet (50 mg total) by mouth at bedtime. 90 tablet 0  . HYDROcodone-acetaminophen (NORCO) 7.5-325 MG tablet Take 1 tablet by mouth every 6 (six) hours as needed. (Patient not taking: Reported on 10/10/2015) 30 tablet 0   No current facility-administered medications on file prior to visit.    Past Medical History  Diagnosis Date  . Anxiety   . Arthritis   . COPD (chronic obstructive pulmonary disease) ([place])   . Depression   . Diabetes mellitus without complication ([place])   . Hypertension   . Neuromuscular disorder ([place])   . Osteoporosis     Past Surgical History  Procedure Laterality Date  . Cholecystectomy    . Abdominal hysterectomy    . Appendectomy      Social History   Social History  . Marital Status: Single    Spouse Name: n/a  . Number of Children: 3  . Years of Education: 12+   Occupational History  . 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 Smoking status: Current Every Day Smoker -- 0.30 packs/day    Types: Cigarettes  . Smokeless tobacco: Never Used  . Alcohol Use: 1.2 - 1.8 oz/week    2-3 Standard drinks or equivalent per week  . Drug Use: No  . Sexual Activity:    Partners: Female   Other Topics Concern  . None   Social History Narrative   Moved to [place] from [place], [place] February 2017, to help her daughter.   Lives with her daughter.   Sons live in [place] and [place].   She reports that there were originally 17 children in her family (she is the youngest), the oldest are deceased, some prior to her birth, and she isn't sure which were female/female or  how they died.    Family History  Problem Relation Age of Onset  . Hyperlipidemia Mother   . Hypertension Mother   . Hyperlipidemia Father   . Hypertension Father   . Stroke Father   . Hypertension Sister   . Cancer Sister     breast cancer  . Crohn's disease Sister     Review of Systems  Constitutional: Negative for fever and chills.  Respiratory: Positive for cough (primarily dry, occasionally up phlegm), shortness of breath and wheezing.   Cardiovascular: Positive for  leg swelling (controlled with lasix). Negative for chest pain and palpitations.  Gastrointestinal: Positive for constipation. Negative for nausea, abdominal pain and blood in stool.  Endocrine: Positive for polydipsia. Negative for polyuria.  Genitourinary: Negative for dysuria and hematuria.  Musculoskeletal: Positive for myalgias, back pain and arthralgias (left knee, feet).  Neurological: Positive for numbness and headaches (occasionally). Negative for dizziness and light-headedness.  Psychiatric/Behavioral: Positive for dysphoric mood (controlled). The patient is nervous/anxious.        Objective:   Filed Vitals:   10/10/15 0949  BP: 150/108  Pulse: 60  Temp: 98.2 F (36.8 C)  Resp: 18   Filed Weights   10/10/15 0949  Weight: 308 lb (139.708 kg)   Body mass index is 48.97 kg/(m^2).   Physical Exam  Constitutional: She is oriented to person, place, and time. She appears well-developed and well-nourished. No distress.  HENT:  Head: Normocephalic and atraumatic.  Right Ear: External ear normal.  Left Ear: External ear normal.  Mouth/Throat: Oropharynx is clear and moist.  Eyes: Conjunctivae are normal.  Neck: Neck supple. No tracheal deviation present. No thyromegaly present.  Cardiovascular: Normal rate, regular rhythm and normal heart sounds.   No murmur heard. Pulmonary/Chest: Effort normal and breath sounds normal. No respiratory distress. She has no wheezes. She has no rales.  Abdominal:  Soft. There is no tenderness. There is no rebound and no guarding.  Musculoskeletal: She exhibits edema (1+ b/l LE edema) and tenderness (lower extremities from neuropathy/fibro).  Lymphadenopathy:    She has no cervical adenopathy.  Neurological: She is alert and oriented to person, place, and time.  Skin: Skin is warm and dry. She is not diaphoretic.  Psychiatric: She has a normal mood and affect. Her behavior is normal. Thought content normal.          Assessment & Plan:   See Problem List for Assessment and Plan of chronic medical problems.

## 2015-10-10 NOTE — Assessment & Plan Note (Addendum)
Taking trazodone, tizanidine, remeron nightly Still not always sleeping Continue trazodone Stop remeron Start amitriptyline for sleep and pain - will titrate as needed

## 2015-10-10 NOTE — Assessment & Plan Note (Signed)
GERD controlled Continue daily medication  

## 2015-10-11 NOTE — Assessment & Plan Note (Signed)
?   H/o diabetes Check a1c

## 2015-10-11 NOTE — Assessment & Plan Note (Signed)
BP not controlled - did not take meds Continue current medications Recheck BP in one month Cmp, tsh

## 2015-10-11 NOTE — Assessment & Plan Note (Signed)
No longer on oxygen Not on any inhalers currently SOB likely related to deconditioning and weight Can re-eval COPD in near future

## 2015-10-11 NOTE — Assessment & Plan Note (Signed)
No chest pain Will obtain old records for history

## 2015-10-11 NOTE — Assessment & Plan Note (Signed)
?   truly controlled D/c wellbutrin and remeron Start amitriptyline 25 mg and then 50 mg at night  F/u in one month

## 2015-10-12 DIAGNOSIS — I639 Cerebral infarction, unspecified: Secondary | ICD-10-CM

## 2015-10-12 HISTORY — DX: Cerebral infarction, unspecified: I63.9

## 2015-10-17 DIAGNOSIS — M1711 Unilateral primary osteoarthritis, right knee: Secondary | ICD-10-CM | POA: Diagnosis not present

## 2015-10-17 DIAGNOSIS — M1712 Unilateral primary osteoarthritis, left knee: Secondary | ICD-10-CM | POA: Diagnosis not present

## 2015-10-22 DIAGNOSIS — E7800 Pure hypercholesterolemia, unspecified: Secondary | ICD-10-CM

## 2015-10-22 DIAGNOSIS — Z5900 Homelessness unspecified: Secondary | ICD-10-CM

## 2015-10-22 DIAGNOSIS — I10 Essential (primary) hypertension: Secondary | ICD-10-CM

## 2015-10-22 DIAGNOSIS — M25562 Pain in left knee: Secondary | ICD-10-CM

## 2015-10-22 NOTE — Congregational Nurse Program (Signed)
Congregational Nurse Program Note  Date of Encounter: 10/22/2015  Past Medical History: Past Medical History  Diagnosis Date  . Anxiety   . Arthritis   . COPD (chronic obstructive pulmonary disease) ([place])   . Depression   . Diabetes mellitus without complication ([place])   . Hypertension   . Neuromuscular disorder ([place])   . Osteoporosis     Encounter Details:     CNP Questionnaire - 10/22/15 1741    Patient Demographics   Is this a new or existing patient? New   Patient is considered a/an Not Applicable   Race African-American/Black   Patient Assistance   Location of Patient Assistance Not Applicable   Patient's financial/insurance status Medicaid   Uninsured Patient No   Patient referred to apply for the following financial assistance Not Applicable   Food insecurities addressed Not Applicable   Transportation assistance No   Assistance securing medications No   Educational health offerings Chronic disease   Encounter Details   Primary purpose of visit Spiritual Care/Support Visit;Education/Health Concerns;Chronic Illness/Condition Visit   Was an Emergency Department visit averted? Not Applicable   Does patient have a medical provider? Yes   Patient referred to Follow up with established PCP   Was a mental health screening completed? (GAINS tool) No   Does patient have dental issues? Yes   Was a dental referral made? Yes   Does patient have vision issues? Yes   Was a vision referral made? Yes   Does your patient have an abnormal blood pressure today? Yes   Since previous encounter, have you referred patient for abnormal blood pressure that resulted in a new diagnosis or medication change? No   Does your patient have an abnormal blood glucose today? No   Since previous encounter, have you referred patient for abnormal blood glucose that resulted in a new diagnosis or medication change? No   Was there a life-saving intervention made? No     First  Visit  To see nurse   ,this 55  Year old  Tammy Strickland  That  Was admitted to  [facility]  About  One  Week ago  . Was living with her daughter  And that  No  Longer  Worked. She gets  Disability and  SS  So  She  Has income  And  Was assisting her  Daughter with  Rent and helping with  [place] children ,couldn't  Take it  Any  More  To  Much  ,feels  Very  Tired  Now  ,waiting on her  Housing  Voucher . Stats  She  Needs to  See a dentist  And have her eyes  Checked. Is followed by  [facility] and is to  Have left  Knee surgery when she is  ready. Client  Feels  She must  Get  Stable  In housing  Before  That  Can occur. Has  PCP   [place].  Gets  Her medications  And takes  Them. Has medical  Transportation .Marland Kitchen She is a smoker ,counseled  And wants  To  Cut  Back  ,smokes  About  1 pack  Every  2-3 days .Marland Kitchen  Commeneded  Client on having her  Medical  Care under control and working on her housing  Issues.  Will  Follow  Up  Next week on referral  To  Dentist and  Eye  MD.

## 2015-10-28 ENCOUNTER — Encounter: Payer: Self-pay | Admitting: Physician Assistant

## 2015-10-28 ENCOUNTER — Telehealth: Payer: Self-pay | Admitting: Emergency Medicine

## 2015-10-28 NOTE — Telephone Encounter (Signed)
What is the reason for the letter?

## 2015-10-28 NOTE — Telephone Encounter (Signed)
Pt called and needs a letter written to [employer] saying she cant be up walking around and need to stay in the building. Wants to also know if you can send that letter to [place], [id number]. Please follow up thanks.

## 2015-10-28 NOTE — Telephone Encounter (Signed)
Please advise 

## 2015-10-29 NOTE — Telephone Encounter (Signed)
LVM for pt to call back to give medical reason for letter.

## 2015-10-30 NOTE — Telephone Encounter (Signed)
Pt called and stated the reason she cant be up walking around is because she need a total knee replacement. She has made an appointment for the middle of next month but they cant operate until she loses weight. Please advise.

## 2015-10-30 NOTE — Telephone Encounter (Signed)
Please advise 

## 2015-10-30 NOTE — Telephone Encounter (Signed)
Ok - can you write a letter?

## 2015-10-31 ENCOUNTER — Encounter (HOSPITAL_COMMUNITY): Payer: Self-pay | Admitting: Emergency Medicine

## 2015-10-31 ENCOUNTER — Observation Stay (HOSPITAL_COMMUNITY)
Admission: EM | Admit: 2015-10-31 | Discharge: 2015-11-01 | Disposition: A | Discharge status: Home / Self Care | Payer: Commercial Managed Care - HMO | Attending: Internal Medicine | Admitting: Internal Medicine

## 2015-10-31 ENCOUNTER — Other Ambulatory Visit: Payer: Self-pay

## 2015-10-31 ENCOUNTER — Emergency Department (HOSPITAL_COMMUNITY): Payer: Commercial Managed Care - HMO

## 2015-10-31 DIAGNOSIS — R079 Chest pain, unspecified: Secondary | ICD-10-CM

## 2015-10-31 DIAGNOSIS — J449 Chronic obstructive pulmonary disease, unspecified: Secondary | ICD-10-CM | POA: Diagnosis not present

## 2015-10-31 DIAGNOSIS — I2510 Atherosclerotic heart disease of native coronary artery without angina pectoris: Secondary | ICD-10-CM | POA: Diagnosis present

## 2015-10-31 DIAGNOSIS — M1990 Unspecified osteoarthritis, unspecified site: Secondary | ICD-10-CM | POA: Insufficient documentation

## 2015-10-31 DIAGNOSIS — E1142 Type 2 diabetes mellitus with diabetic polyneuropathy: Secondary | ICD-10-CM | POA: Insufficient documentation

## 2015-10-31 DIAGNOSIS — F17210 Nicotine dependence, cigarettes, uncomplicated: Secondary | ICD-10-CM | POA: Diagnosis not present

## 2015-10-31 DIAGNOSIS — Z8639 Personal history of other endocrine, nutritional and metabolic disease: Secondary | ICD-10-CM

## 2015-10-31 DIAGNOSIS — Z6841 Body Mass Index (BMI) 40.0 and over, adult: Secondary | ICD-10-CM | POA: Insufficient documentation

## 2015-10-31 DIAGNOSIS — I10 Essential (primary) hypertension: Secondary | ICD-10-CM | POA: Diagnosis not present

## 2015-10-31 DIAGNOSIS — I25119 Atherosclerotic heart disease of native coronary artery with unspecified angina pectoris: Secondary | ICD-10-CM | POA: Diagnosis not present

## 2015-10-31 DIAGNOSIS — R0789 Other chest pain: Secondary | ICD-10-CM | POA: Diagnosis not present

## 2015-10-31 DIAGNOSIS — G4700 Insomnia, unspecified: Secondary | ICD-10-CM | POA: Insufficient documentation

## 2015-10-31 DIAGNOSIS — Z7982 Long term (current) use of aspirin: Secondary | ICD-10-CM | POA: Insufficient documentation

## 2015-10-31 DIAGNOSIS — F419 Anxiety disorder, unspecified: Secondary | ICD-10-CM | POA: Insufficient documentation

## 2015-10-31 DIAGNOSIS — M797 Fibromyalgia: Secondary | ICD-10-CM | POA: Insufficient documentation

## 2015-10-31 DIAGNOSIS — I152 Hypertension secondary to endocrine disorders: Secondary | ICD-10-CM | POA: Diagnosis present

## 2015-10-31 HISTORY — DX: Atherosclerotic heart disease of native coronary artery without angina pectoris: I25.10

## 2015-10-31 HISTORY — DX: Gastro-esophageal reflux disease without esophagitis: K21.9

## 2015-10-31 LAB — COMPREHENSIVE METABOLIC PANEL
ALT: 29 U/L (ref 14–54)
AST: 34 U/L (ref 15–41)
Albumin: 3.3 g/dL — ABNORMAL LOW (ref 3.5–5.0)
Alkaline Phosphatase: 97 U/L (ref 38–126)
Anion gap: 8 (ref 5–15)
BUN: 15 mg/dL (ref 6–20)
CO2: 25 mmol/L (ref 22–32)
Calcium: 8.9 mg/dL (ref 8.9–10.3)
Chloride: 104 mmol/L (ref 101–111)
Creatinine, Ser: 0.92 mg/dL (ref 0.44–1.00)
GFR calc Af Amer: >60 mL/min (ref >60)
GFR calc non Af Amer: >60 mL/min (ref >60)
Glucose, Bld: 142 mg/dL — ABNORMAL HIGH (ref 65–99)
Potassium: 4 mmol/L (ref 3.5–5.1)
Sodium: 137 mmol/L (ref 135–145)
Total Bilirubin: 0.3 mg/dL (ref 0.3–1.2)
Total Protein: 6 g/dL — ABNORMAL LOW (ref 6.5–8.1)

## 2015-10-31 LAB — CBC WITH DIFFERENTIAL/PLATELET
Basophils Absolute: 0 10*3/uL (ref 0.0–0.1)
Basophils Relative: 0 %
Eosinophils Absolute: 0.2 10*3/uL (ref 0.0–0.7)
Eosinophils Relative: 2 %
HCT: 45.3 % (ref 36.0–46.0)
Hemoglobin: 15.2 g/dL — ABNORMAL HIGH (ref 12.0–15.0)
Lymphocytes Relative: 34 %
Lymphs Abs: 4 10*3/uL (ref 0.7–4.0)
MCH: 27.9 pg (ref 26.0–34.0)
MCHC: 33.6 g/dL (ref 30.0–36.0)
MCV: 83.1 fL (ref 78.0–100.0)
Monocytes Absolute: 1.1 10*3/uL — ABNORMAL HIGH (ref 0.1–1.0)
Monocytes Relative: 9 %
Neutro Abs: 6.6 10*3/uL (ref 1.7–7.7)
Neutrophils Relative %: 55 %
Platelets: ADEQUATE 10*3/uL (ref 150–400)
RBC: 5.45 MIL/uL — ABNORMAL HIGH (ref 3.87–5.11)
RDW: 15.6 % — ABNORMAL HIGH (ref 11.5–15.5)
WBC: 11.9 10*3/uL — ABNORMAL HIGH (ref 4.0–10.5)

## 2015-10-31 LAB — I-STAT TROPONIN, ED: Troponin i, poc: 0 ng/mL (ref 0.00–0.08)

## 2015-10-31 LAB — LIPASE, BLOOD: Lipase: 18 U/L (ref 11–51)

## 2015-10-31 LAB — TROPONIN I: Troponin I: <0.03 ng/mL (ref <0.03)

## 2015-10-31 MED ORDER — MIRTAZAPINE 30 MG PO TABS
30.0000 mg | ORAL_TABLET | Freq: Once | ORAL | Status: AC
  Administered 2015-10-31: 30 mg via ORAL
  Filled 2015-10-31: qty 1

## 2015-10-31 MED ORDER — ALBUTEROL SULFATE (2.5 MG/3ML) 0.083% IN NEBU: 2.5000 mg | INHALATION_SOLUTION | RESPIRATORY_TRACT | Status: DC | PRN

## 2015-10-31 MED ORDER — CLONIDINE HCL 0.2 MG PO TABS
0.3000 mg | ORAL_TABLET | Freq: Every day | ORAL | Status: DC
  Administered 2015-11-01: 0.3 mg via ORAL
  Filled 2015-10-31: qty 1

## 2015-10-31 MED ORDER — AMITRIPTYLINE HCL 50 MG PO TABS
50.0000 mg | ORAL_TABLET | Freq: Every day | ORAL | Status: DC
  Administered 2015-10-31: 50 mg via ORAL
  Filled 2015-10-31: qty 1

## 2015-10-31 MED ORDER — TRAZODONE HCL 50 MG PO TABS
50.0000 mg | ORAL_TABLET | Freq: Every day | ORAL | Status: DC
  Administered 2015-10-31: 50 mg via ORAL
  Filled 2015-10-31: qty 1

## 2015-10-31 MED ORDER — POTASSIUM CHLORIDE CRYS ER 20 MEQ PO TBCR
20.0000 meq | EXTENDED_RELEASE_TABLET | Freq: Every day | ORAL | Status: DC
  Administered 2015-11-01: 20 meq via ORAL
  Filled 2015-10-31: qty 1

## 2015-10-31 MED ORDER — ACETAMINOPHEN 325 MG PO TABS: 650.0000 mg | ORAL_TABLET | ORAL | Status: DC | PRN

## 2015-10-31 MED ORDER — HYDRALAZINE HCL 20 MG/ML IJ SOLN: 10.0000 mg | Freq: Four times a day (QID) | INTRAMUSCULAR | Status: DC | PRN

## 2015-10-31 MED ORDER — HEPARIN SODIUM (PORCINE) 5000 UNIT/ML IJ SOLN
5000.0000 [IU] | Freq: Three times a day (TID) | INTRAMUSCULAR | Status: DC
  Administered 2015-10-31 – 2015-11-01 (×2): 5000 [IU] via SUBCUTANEOUS
  Filled 2015-10-31 (×2): qty 1

## 2015-10-31 MED ORDER — PANTOPRAZOLE SODIUM 40 MG PO TBEC
40.0000 mg | DELAYED_RELEASE_TABLET | Freq: Every day | ORAL | Status: DC
  Administered 2015-11-01: 40 mg via ORAL
  Filled 2015-10-31: qty 1

## 2015-10-31 MED ORDER — LOSARTAN POTASSIUM 50 MG PO TABS
50.0000 mg | ORAL_TABLET | Freq: Two times a day (BID) | ORAL | Status: DC
  Administered 2015-10-31 – 2015-11-01 (×2): 50 mg via ORAL
  Filled 2015-10-31 (×2): qty 1

## 2015-10-31 MED ORDER — METOPROLOL SUCCINATE ER 100 MG PO TB24
200.0000 mg | ORAL_TABLET | Freq: Every day | ORAL | Status: DC
  Administered 2015-11-01: 200 mg via ORAL
  Filled 2015-10-31: qty 2

## 2015-10-31 MED ORDER — TIZANIDINE HCL 4 MG PO TABS
8.0000 mg | ORAL_TABLET | Freq: Once | ORAL | Status: AC
  Administered 2015-10-31: 8 mg via ORAL
  Filled 2015-10-31: qty 2

## 2015-10-31 MED ORDER — ONDANSETRON HCL 4 MG/2ML IJ SOLN: 4.0000 mg | Freq: Four times a day (QID) | INTRAMUSCULAR | Status: DC | PRN

## 2015-10-31 MED ORDER — FUROSEMIDE 80 MG PO TABS
80.0000 mg | ORAL_TABLET | Freq: Every day | ORAL | Status: DC
  Administered 2015-11-01: 80 mg via ORAL
  Filled 2015-10-31: qty 1

## 2015-10-31 MED ORDER — MORPHINE SULFATE (PF) 2 MG/ML IV SOLN: 2.0000 mg | INTRAVENOUS | Status: DC | PRN

## 2015-10-31 MED ORDER — FLUTICASONE PROPIONATE 50 MCG/ACT NA SUSP
1.0000 | Freq: Every day | NASAL | Status: DC | PRN
  Filled 2015-10-31: qty 16

## 2015-10-31 MED ORDER — ASPIRIN EC 325 MG PO TBEC
325.0000 mg | DELAYED_RELEASE_TABLET | Freq: Every day | ORAL | Status: DC
  Administered 2015-11-01: 325 mg via ORAL
  Filled 2015-10-31: qty 1

## 2015-10-31 MED ORDER — SODIUM CHLORIDE 0.9 % IV SOLN
INTRAVENOUS | Status: DC
  Administered 2015-10-31: 21:00:00 via INTRAVENOUS

## 2015-10-31 MED ORDER — SODIUM CHLORIDE 0.9 % IV BOLUS (SEPSIS): 1000.0000 mL | Freq: Once | INTRAVENOUS | Status: DC

## 2015-10-31 NOTE — ED Notes (Signed)
To ED via [facility] with c/o chest pain and leg cramps after being outside - in and out of air conditioning - chest pain gone - now has leg cramping.

## 2015-10-31 NOTE — ED Notes (Signed)
Pt does not want IV fluids-- states  "Just give me something to drink and I am good to go"

## 2015-10-31 NOTE — Progress Notes (Signed)
New admit placed in bed, telemetry placed, CCMD called and verified. Upon arrival patients BP was 150/121, called MD Elgergawy, and he gave a verbal order for hydralazine 10mg  q6 prn. Upon arrival patients IV was also out of arm and bleeding, was removed, and order placed for Iv consult. Will continue to monitor.  Cyndia Bent

## 2015-10-31 NOTE — ED Notes (Signed)
Pt is in recovery from drugs.

## 2015-10-31 NOTE — ED Provider Notes (Signed)
Emergency Department Provider Note  Time seen: Approximately 3:20 PM  I have reviewed the triage vital signs and the nursing notes.   HISTORY  Chief Complaint Chest Pain and leg cramping    HPI Tammy Strickland is a 55 y.o. female with PMH of HTN, COPD, CAD presents to the emergency department for evaluation of back discomfort and right-sided chest discomfort with associated leg cramping. The patient states she was walking outside when she suddenly felt pressure in her middle back. She then felt a "gas bubble" sensation in her right lower chest. No pleuritic pain. She continued to walk outside and began to feel worse. Stopping did not improve the chest pain but seemed to make her nausea worse. She had no vomiting. No associated fever. The patient states that she had a prior history of coronary artery disease but cannot recall if any stents or other interventions were taken. She reports that the sensation today felt similar to that episode. She called EMS and was given ASA on scene.    Past Medical History  Diagnosis Date  . Anxiety   . Arthritis   . COPD (chronic obstructive pulmonary disease) ([place])   . Depression   . Diabetes mellitus without complication ([place])   . Hypertension   . Neuromuscular disorder ([place])   . Osteoporosis     Patient Active Problem List   Diagnosis Date Noted  . Chest pain 10/31/2015  . Insomnia 10/10/2015  . Prediabetes 10/10/2015  . Depression 10/10/2015  . Benign essential HTN 08/15/2015  . Fibromyalgia 08/15/2015  . Peripheral neuropathy ([place]) 08/15/2015  . Arthritis 08/15/2015  . CAD (coronary artery disease) 08/15/2015  . COPD (chronic obstructive pulmonary disease) ([place]) 08/15/2015  . History of diabetes mellitus 08/15/2015  . Osteoporosis 08/15/2015  . 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 DDD (degenerative disc disease), lumbar 08/15/2015  . Occupational exposure to industrial toxins 08/15/2015  . GERD (gastroesophageal reflux disease) 08/15/2015      Past Surgical History  Procedure Laterality Date  . Cholecystectomy    . Abdominal hysterectomy    . Appendectomy    . Tonsillectomy      No current outpatient prescriptions on file.  Allergies Review of patient's allergies indicates no known allergies.  Family History  Problem Relation Age of Onset  . Hyperlipidemia Mother   . Hypertension Mother   . Hyperlipidemia Father   . Hypertension Father   . Stroke Father   . Hypertension Sister   . Cancer Sister     breast cancer  . Crohn's disease Sister     Social History Social History  Substance Use Topics  . Smoking status: Current Every Day Smoker -- 1.00 packs/day    Types: Cigarettes  . Smokeless tobacco: Never Used     Comment: referred  to smoking  cessation  classes. at  [facility]   . Alcohol Use: No    Review of Systems  Constitutional: No fever/chills Eyes: No visual changes. ENT: No sore throat. Cardiovascular: Positive chest pain. Respiratory: Denies shortness of breath. Gastrointestinal: No abdominal pain. Positive nausea, no vomiting.  No diarrhea.  No constipation. Genitourinary: Negative for dysuria. Musculoskeletal: Negative for back pain. Skin: Negative for rash. Neurological: Negative for headaches, focal weakness or numbness.  10-point ROS otherwise negative.  ____________________________________________   PHYSICAL EXAM:  VITAL SIGNS: ED Triage Vitals  Enc Vitals Group     BP 10/31/15 1415 109/82 mmHg     Pulse Rate 10/31/15 1415 60     Resp  10/31/15 1415 19     Temp 10/31/15 1415 98 F (36.7 C)     Temp Source 10/31/15 1415 Oral     SpO2 10/31/15 1415 97 %     Pain Score 10/31/15 1411 5   Constitutional: Alert and oriented. Well appearing and in no acute distress. Eyes: Conjunctivae are normal. PERRL. EOMI. Head: Atraumatic. Nose: No congestion/rhinnorhea. Mouth/Throat: Mucous membranes are moist.  Oropharynx non-erythematous. Neck: No stridor.  No meningeal signs.   Cardiovascular: Normal rate, regular rhythm. Good peripheral circulation. Grossly normal heart sounds.   Respiratory: Normal respiratory effort.  No retractions. Lungs CTAB. Gastrointestinal: Soft and nontender. No distention.  Musculoskeletal: No lower extremity tenderness nor edema. No gross deformities of extremities. Neurologic:  Normal speech and language. No gross focal neurologic deficits are appreciated.  Skin:  Skin is warm, dry and intact. No rash noted. Psychiatric: Mood and affect are normal. Speech and behavior are normal.  ____________________________________________   LABS (all labs ordered are listed, but only abnormal results are displayed)  Labs Reviewed  CBC WITH DIFFERENTIAL/PLATELET - Abnormal; Notable for the following:    WBC 11.9 (*)    RBC 5.45 (*)    Hemoglobin 15.2 (*)    RDW 15.6 (*)    Monocytes Absolute 1.1 (*)    All other components within normal limits  HEMOGLOBIN A1C  TROPONIN I  TROPONIN I  TROPONIN I  CBC  CREATININE, SERUM  I-STAT TROPOININ, ED   ____________________________________________  EKG  Reviewed in MUSE.  ____________________________________________  RADIOLOGY  Dg Chest 2 View  10/31/2015  CLINICAL DATA:  Chest pain since noon today. EXAM: CHEST  2 VIEW COMPARISON:  None. FINDINGS: The cardiac silhouette, mediastinal and hilar contours are within normal limits. There is mild tortuosity of the thoracic aorta. The lungs are clear. No pleural effusions. The bony thorax is intact. IMPRESSION: No acute cardiopulmonary findings. Electronically Signed   By: Marijo Sanes M.D.   On: 10/31/2015 15:36    ____________________________________________   PROCEDURES  Procedure(s) performed:   Procedures  None ____________________________________________   INITIAL IMPRESSION / ASSESSMENT AND PLAN / ED COURSE  Pertinent labs & imaging results that were available during my care of the patient were reviewed by me and considered in  my medical decision making (see chart for details).  Patient presents to the emergency department for evaluation of chest discomfort. Parts of the patient's story are somewhat atypical for traditional ACS however given the patient's gender, age, and prior history of ACS with similar presentation I am concerned regarding the patient's chest pain. Labs pending. EKG reviewed. No STEMI. Given ASA by EMS on scene. HEART score 4.   Troponin negative. Discussed admission with the hospitalist who will be down for ED evaluation and admission. They plan to place orders for floor placement. Appreciate assistance with this case.    ____________________________________________  FINAL CLINICAL IMPRESSION(S) / ED DIAGNOSES  Final diagnoses:  Chest pain, unspecified chest pain type     MEDICATIONS GIVEN DURING THIS VISIT:  Medications  amitriptyline (ELAVIL) tablet 50 mg (not administered)  cloNIDine (CATAPRES) tablet 0.3 mg (not administered)  furosemide (LASIX) tablet 80 mg (not administered)  metoprolol succinate (TOPROL-XL) 24 hr tablet 200 mg (not administered)  potassium chloride SA (K-DUR,KLOR-CON) CR tablet 20 mEq (not administered)  losartan (COZAAR) tablet 50 mg (not administered)  pantoprazole (PROTONIX) EC tablet 40 mg (not administered)  traZODone (DESYREL) tablet 50 mg (not administered)  fluticasone (FLONASE) 50 MCG/ACT nasal spray 1 spray (not  administered)  acetaminophen (TYLENOL) tablet 650 mg (not administered)  ondansetron (ZOFRAN) injection 4 mg (not administered)  0.9 %  sodium chloride infusion (not administered)  heparin injection 5,000 Units (not administered)  morphine 2 MG/ML injection 2 mg (not administered)  aspirin EC tablet 325 mg (not administered)  albuterol (PROVENTIL) (2.5 MG/3ML) 0.083% nebulizer solution 2.5 mg (not administered)  hydrALAZINE (APRESOLINE) injection 10 mg (not administered)     NEW OUTPATIENT MEDICATIONS STARTED DURING THIS  VISIT:  None   Note:  This document was prepared using Dragon voice recognition software and may include unintentional dictation errors.  Nanda Quinton, MD Emergency Medicine  Margette Fast, MD 10/31/15 [phone]

## 2015-10-31 NOTE — H&P (Signed)
TRH H&P   Patient Demographics:    Tammy Strickland, is a 55 y.o. female  MRN: [id number]   DOB - 09/28/1960  Admit Date - 10/31/2015  Outpatient Primary MD for the patient is Binnie Rail, MD  Referring MD/NP/PA: Dr Laverta Baltimore  Patient coming from: Home  Chief Complaint  Patient presents with  . Chest Pain  . leg cramping       HPI:    Tammy Strickland  is a 55 y.o. female, 55 year old female with history of hypertension, COPD, CAD, diabetes mellitus in the past, currently controlled without medication, as with complaints of midsternal chest discomfort associated with generalized body cramping, reports she was walking outside, was on the bus, when she suddenly felt midsternal chest pain, denies any dyspnea, nausea, diaphoresis with the pain, resolved without intervention, no provoking or motivating factor, reports she had similar episode of chest pain last year, at [facility], reports she had stress test done then, report it was normal, and she denies any chest pain or shortness of breath, EKG with no evidence of ischemia, first troponin is negative, I was called to admit    Review of systems:    In addition to the HPI above, No Fever-chills, No Headache, No changes with Vision or hearing, No problems swallowing food or Liquids, Complaints of Chest pain, denies Cough or Shortness of Breath, No Abdominal pain, No Nausea or Vommitting, Bowel movements are regular, No Blood in stool or Urine, No dysuria, No new skin rashes or bruises, No new joints pains-aches,  No new weakness, tingling, numbness in any extremity, No recent weight gain or loss, No polyuria, polydypsia or polyphagia, No significant Mental Stressors.  A full 10 point Review of Systems was done, except as stated above, all other Review of Systems were negative.   With Past History of the following :    Past  Medical History  Diagnosis Date  . Anxiety   . Arthritis   . COPD (chronic obstructive pulmonary disease) ([place])   . Depression   . Diabetes mellitus without complication ([place])   . Hypertension   . Neuromuscular disorder ([place])   . Osteoporosis       Past Surgical History  Procedure Laterality Date  . Cholecystectomy    . Abdominal hysterectomy    . Appendectomy    . Tonsillectomy        Social History:     Social History  Substance Use Topics  . Smoking status: Current Every Day Smoker -- 1.00 packs/day    Types: Cigarettes  . Smokeless tobacco: Never Used     Comment: referred  to smoking  cessation  classes. at  [facility]   . Alcohol Use: No     Lives - At home  Mobility - Independent     Family History :     Family History  Problem Relation Age of Onset  .  Hyperlipidemia Mother   . Hypertension Mother   . Hyperlipidemia Father   . Hypertension Father   . Stroke Father   . Hypertension Sister   . Cancer Sister     breast cancer  . Crohn's disease Sister       Home Medications:   Prior to Admission medications   Medication Sig Start Date End Date Taking? Authorizing Provider  amitriptyline (ELAVIL) 25 MG tablet Take 25 mg at night, increase to 50 mg at night after one week Patient taking differently: Take 50 mg by mouth at bedtime. Take 25 mg at night, increase to 50 mg at night after one week 10/10/15  Yes Binnie Rail, MD  cloNIDine (CATAPRES) 0.3 MG tablet Take 1 tablet (0.3 mg total) by mouth daily. Reported on 08/15/2015 10/10/15  Yes Binnie Rail, MD  fluticasone (FLONASE) 50 MCG/ACT nasal spray Place 1 spray into both nostrils daily as needed for allergies.    Yes Historical Provider, MD  furosemide (LASIX) 80 MG tablet Take 1 tablet (80 mg total) by mouth daily. 10/10/15  Yes Binnie Rail, MD  gabapentin (NEURONTIN) 800 MG tablet Take 1 tablet (800 mg total) by mouth 3 (three) times daily. 08/29/15  Yes Chelle Jeffery, PA-C  hydrOXYzine (VISTARIL) 25  MG capsule Take 25 mg by mouth 3 (three) times daily. Reported on 08/15/2015   Yes Historical Provider, MD  Ipratropium-Albuterol (COMBIVENT) 20-100 MCG/ACT AERS respimat Inhale 1 puff into the lungs every 6 (six) hours. Patient taking differently: Inhale 1 puff into the lungs every 6 (six) hours as needed.  10/10/15  Yes Binnie Rail, MD  losartan (COZAAR) 50 MG tablet Take 1 tablet (50 mg total) by mouth 2 (two) times daily. 08/29/15  Yes Chelle Jeffery, PA-C  meloxicam (MOBIC) 15 MG tablet Take 1 tablet (15 mg total) by mouth daily. 08/29/15  Yes Chelle Jeffery, PA-C  metoprolol (TOPROL-XL) 200 MG 24 hr tablet Take 1 tablet (200 mg total) by mouth daily. 10/10/15  Yes Binnie Rail, MD  mirtazapine (REMERON) 30 MG tablet Take 30 mg by mouth at bedtime. 09/02/15  Yes Historical Provider, MD  nitroGLYCERIN (NITRODUR - DOSED IN MG/24 HR) 0.4 mg/hr patch Place 0.4 mg onto the skin as needed.   Yes Historical Provider, MD  omeprazole (PRILOSEC) 20 MG capsule Take 1 capsule (20 mg total) by mouth daily. 08/29/15  Yes Chelle Jeffery, PA-C  potassium chloride SA (K-DUR,KLOR-CON) 20 MEQ tablet Take 1 tablet (20 mEq total) by mouth daily. Reported on 08/15/2015 10/10/15  Yes Binnie Rail, MD  tiZANidine (ZANAFLEX) 4 MG tablet Take 2.5 tablets (10 mg total) by mouth 2 (two) times daily as needed. Patient taking differently: Take 8 mg by mouth 3 (three) times daily.  08/29/15  Yes Chelle Jeffery, PA-C  traZODone (DESYREL) 50 MG tablet Take 1 tablet (50 mg total) by mouth at bedtime. 08/29/15  Yes Harrison Mons, PA-C  NONFORMULARY OR COMPOUNDED [place]:  Onychomycosis Nail Lacquer - Fluconazole 2%, Terbinafine 1%, DMSO, apply to affected area daily. 09/20/15   Trula Slade, DPM     Allergies:    No Known Allergies   Physical Exam:   Vitals  Blood pressure 118/98, pulse 60, temperature 98 F (36.7 C), temperature source Oral, resp. rate 16, SpO2 97 %.   1. General , morbidly obese  lying in  bed in NAD,  2. Normal affect and insight, Not Suicidal or Homicidal, Awake Alert, Oriented X 3.  3.  No F.N deficits, ALL C.Nerves Intact, Strength 5/5 all 4 extremities, Sensation intact all 4 extremities, Plantars down going.  4. Ears and Eyes appear Normal, Conjunctivae clear, PERRLA. Moist Oral Mucosa.  5. Supple Neck, No JVD, No cervical lymphadenopathy appriciated, No Carotid Bruits.  6. Symmetrical Chest wall movement, Good air movement bilaterally, CTAB.  7. RRR, No Gallops, Rubs or Murmurs, No Parasternal Heave.  8. Positive Bowel Sounds, Abdomen Soft, No tenderness, No organomegaly appriciated,No rebound -guarding or rigidity.  9.  No Cyanosis, Normal Skin Turgor, No Skin Rash or Bruise.  10. Good muscle tone,  joints appear normal , no effusions, Normal ROM.  11. No Palpable Lymph Nodes in Neck or Axillae    Data Review:    CBC  Recent Labs Lab 10/31/15 1600  WBC 11.9*  HGB 15.2*  HCT 45.3  PLT PLATELET CLUMPS NOTED ON SMEAR, COUNT APPEARS ADEQUATE  MCV 83.1  MCH 27.9  MCHC 33.6  RDW 15.6*  LYMPHSABS 4.0  MONOABS 1.1*  EOSABS 0.2  BASOSABS 0.0   ------------------------------------------------------------------------------------------------------------------  Chemistries  No results for input(s): NA, K, CL, CO2, GLUCOSE, BUN, CREATININE, CALCIUM, MG, AST, ALT, ALKPHOS, BILITOT in the last 168 hours.  Invalid input(s): GFRCGP ------------------------------------------------------------------------------------------------------------------ CrCl cannot be calculated (Unknown ideal weight.). ------------------------------------------------------------------------------------------------------------------ No results for input(s): TSH, T4TOTAL, T3FREE, THYROIDAB in the last 72 hours.  Invalid input(s): FREET3  Coagulation profile No results for input(s): INR, PROTIME in the last 168  hours. ------------------------------------------------------------------------------------------------------------------- No results for input(s): DDIMER in the last 72 hours. -------------------------------------------------------------------------------------------------------------------  Cardiac Enzymes No results for input(s): CKMB, TROPONINI, MYOGLOBIN in the last 168 hours.  Invalid input(s): CK ------------------------------------------------------------------------------------------------------------------ No results found for: BNP   ---------------------------------------------------------------------------------------------------------------  Urinalysis No results found for: COLORURINE, APPEARANCEUR, LABSPEC, PHURINE, GLUCOSEU, HGBUR, BILIRUBINUR, KETONESUR, PROTEINUR, UROBILINOGEN, NITRITE, LEUKOCYTESUR  ----------------------------------------------------------------------------------------------------------------   Imaging Results:    Dg Chest 2 View  10/31/2015  CLINICAL DATA:  Chest pain since noon today. EXAM: CHEST  2 VIEW COMPARISON:  None. FINDINGS: The cardiac silhouette, mediastinal and hilar contours are within normal limits. There is mild tortuosity of the thoracic aorta. The lungs are clear. No pleural effusions. The bony thorax is intact. IMPRESSION: No acute cardiopulmonary findings. Electronically Signed   By: Marijo Sanes M.D.   On: 10/31/2015 15:36    My personal review of EKG: Rhythm NSR, Rate  61 /min, QTc 450 , no Acute ST changes   Assessment & Plan:    Active Problems:   Benign essential HTN   CAD (coronary artery disease)   COPD (chronic obstructive pulmonary disease) (HCC)   History of diabetes mellitus   Chest pain   Chest pain - Currently chest pain-free, has risk factors including her history of diabetes mellitus, hypertension, and morbid obesity, and smoking, EKG with no evidence of acute ischemia, she will be admitted to observation  overnight, will monitor on telemetry, cycle 3 sets of cardiac enzymes, and obtain 2-D echo in a.m., will start on full dose aspirin, received aspirin by EMS, has some muscular skeletal future, as it was related to generalized body cramping, most likely from hot weather, will follow on CMP.  Hypertension - Blood pressure acceptable, will continue with home medication clonidine  History of diabetes mellitus - Patient reports it has been controlled, she stopped using metformin 2010, will check hemoglobin A1c.  History of COPD - No dyspnea, no active wheezing, will continue with when necessary nebs     DVT Prophylaxis Heparin - SCDs  AM Labs Ordered,  also please review Full Orders  Family Communication: Admission, patients condition and plan of care including tests being ordered have been discussed with the patient  who indicate understanding and agree with the plan and Code Status.  Code Status Full  Likely DC to  Home  Condition GUARDED    Consults called: None  Admission status: obs  Time spent in minutes : 55 minutes   Analisia Kingsford M.D on 10/31/2015 at 5:06 PM  Between 7am to 7pm - Pager - [phone]. After 7pm go to [email or web address] - password [facility]  [facility] - Office  [phone]

## 2015-11-01 ENCOUNTER — Encounter (HOSPITAL_COMMUNITY): Payer: Self-pay | Admitting: General Practice

## 2015-11-01 ENCOUNTER — Observation Stay (HOSPITAL_BASED_OUTPATIENT_CLINIC_OR_DEPARTMENT_OTHER): Payer: Commercial Managed Care - HMO

## 2015-11-01 DIAGNOSIS — I2510 Atherosclerotic heart disease of native coronary artery without angina pectoris: Secondary | ICD-10-CM | POA: Diagnosis not present

## 2015-11-01 DIAGNOSIS — R079 Chest pain, unspecified: Secondary | ICD-10-CM | POA: Diagnosis not present

## 2015-11-01 DIAGNOSIS — Z8639 Personal history of other endocrine, nutritional and metabolic disease: Secondary | ICD-10-CM | POA: Diagnosis not present

## 2015-11-01 DIAGNOSIS — I25119 Atherosclerotic heart disease of native coronary artery with unspecified angina pectoris: Secondary | ICD-10-CM

## 2015-11-01 DIAGNOSIS — I10 Essential (primary) hypertension: Secondary | ICD-10-CM | POA: Diagnosis not present

## 2015-11-01 DIAGNOSIS — R0789 Other chest pain: Secondary | ICD-10-CM | POA: Diagnosis not present

## 2015-11-01 DIAGNOSIS — J449 Chronic obstructive pulmonary disease, unspecified: Secondary | ICD-10-CM | POA: Diagnosis not present

## 2015-11-01 DIAGNOSIS — Z6841 Body Mass Index (BMI) 40.0 and over, adult: Secondary | ICD-10-CM | POA: Diagnosis not present

## 2015-11-01 DIAGNOSIS — F419 Anxiety disorder, unspecified: Secondary | ICD-10-CM | POA: Diagnosis not present

## 2015-11-01 DIAGNOSIS — F17210 Nicotine dependence, cigarettes, uncomplicated: Secondary | ICD-10-CM | POA: Diagnosis not present

## 2015-11-01 DIAGNOSIS — E1142 Type 2 diabetes mellitus with diabetic polyneuropathy: Secondary | ICD-10-CM | POA: Diagnosis not present

## 2015-11-01 LAB — CBC
HCT: 44.5 % (ref 36.0–46.0)
Hemoglobin: 14.7 g/dL (ref 12.0–15.0)
MCH: 28 pg (ref 26.0–34.0)
MCHC: 33 g/dL (ref 30.0–36.0)
MCV: 84.8 fL (ref 78.0–100.0)
Platelets: 247 K/uL (ref 150–400)
RBC: 5.25 MIL/uL — ABNORMAL HIGH (ref 3.87–5.11)
RDW: 14.5 % (ref 11.5–15.5)
WBC: 13.4 K/uL — ABNORMAL HIGH (ref 4.0–10.5)

## 2015-11-01 LAB — ECHOCARDIOGRAM COMPLETE
Height: 67 in
Weight: 4811.2 oz

## 2015-11-01 LAB — TROPONIN I: Troponin I: <0.03 ng/mL (ref <0.03)

## 2015-11-01 MED ORDER — ASPIRIN EC 81 MG PO TBEC: 81.0000 mg | DELAYED_RELEASE_TABLET | Freq: Every day | ORAL | Status: AC

## 2015-11-01 MED ORDER — PERFLUTREN LIPID MICROSPHERE
1.0000 mL | INTRAVENOUS | Status: DC | PRN
  Administered 2015-11-01: 2 mL via INTRAVENOUS
  Filled 2015-11-01: qty 10

## 2015-11-01 MED ORDER — PERFLUTREN LIPID MICROSPHERE
INTRAVENOUS | Status: AC
  Filled 2015-11-01: qty 10

## 2015-11-01 MED ORDER — TIZANIDINE HCL 4 MG PO TABS
10.0000 mg | ORAL_TABLET | Freq: Two times a day (BID) | ORAL | Status: DC | PRN
  Filled 2015-11-01: qty 1

## 2015-11-01 MED ORDER — HYDROXYZINE PAMOATE 25 MG PO CAPS
25.0000 mg | ORAL_CAPSULE | Freq: Three times a day (TID) | ORAL | Status: DC
  Filled 2015-11-01 (×2): qty 1

## 2015-11-01 MED ORDER — MIRTAZAPINE 30 MG PO TABS
30.0000 mg | ORAL_TABLET | Freq: Every day | ORAL | Status: DC
  Filled 2015-11-01: qty 1

## 2015-11-01 MED ORDER — GABAPENTIN 800 MG PO TABS
800.0000 mg | ORAL_TABLET | Freq: Three times a day (TID) | ORAL | Status: DC
  Filled 2015-11-01 (×2): qty 1

## 2015-11-01 NOTE — Discharge Summary (Signed)
Tammy Strickland, is a 55 y.o. female  DOB 12-Nov-1960  MRN [id number].  Admission date:  10/31/2015  Admitting Physician  Albertine Patricia, MD  Discharge Date:  11/01/2015   Primary MD  Binnie Rail, MD  Recommendations for primary care physician for things to follow:  - Please check CBC, BMP during next visit   Admission Diagnosis  Chest pain, unspecified chest pain type [R07.9]   Discharge Diagnosis  Chest pain, unspecified chest pain type [R07.9]    Active Problems:   Benign essential HTN   CAD (coronary artery disease)   COPD (chronic obstructive pulmonary disease) ([place])   History of diabetes mellitus   Chest pain      Past Medical History  Diagnosis Date  . Anxiety   . Arthritis   . COPD (chronic obstructive pulmonary disease) ([place])   . Depression   . Diabetes mellitus without complication ([place])   . Hypertension   . Neuromuscular disorder ([place])   . Osteoporosis   . Coronary artery disease   . GERD (gastroesophageal reflux disease)     Past Surgical History  Procedure Laterality Date  . Cholecystectomy    . Abdominal hysterectomy    . Appendectomy    . Tonsillectomy         History of present illness and  Hospital Course:     Kindly see H&P for history of present illness and admission details, please review complete Labs, Consult reports and Test reports for all details in brief  HPI  from the history and physical done on the day of admission 10/31/2015  Tammy Strickland is a 55 y.o. female, 55 year old female with history of hypertension, COPD, CAD, diabetes mellitus in the past, currently controlled without medication, as with complaints of midsternal chest discomfort associated with generalized body cramping, reports she was walking outside, was on the bus, when she suddenly felt midsternal chest pain, denies any dyspnea, nausea, diaphoresis with the pain, resolved  without intervention, no provoking or motivating factor, reports she had similar episode of chest pain last year, at [facility], reports she had stress test done then, report it was normal, and she denies any chest pain or shortness of breath, EKG with no evidence of ischemia, first troponin is negative, I was called to admit   Hospital Course   Chest pain -  has some musculoskeletal features, as it was related to generalized body cramping, most likely from hot weather, , but given her risk factors, she was admitted overnight for observation, no significant events on telemetry, EKG nonacute, cardiac enzymes negative 3, 2-D echo obtained, with normal EF, grade 1 diastolic dysfunction, and no regional wall motion abnormality, patient was discharged, and last Estratest to start aspirin 81 mg oral daily.  Hypertension -  continue with home medication clonidine  History of diabetes mellitus - Patient reports it has been controlled, she stopped using metformin 2010, A1c pending at time of discharge  History of COPD - No dyspnea, no active wheezing,      Discharge  Condition:  Stable   Follow UP  Follow-up Information    Follow up with Binnie Rail, MD. Schedule an appointment as soon as possible for a visit in 1 week.   Specialty:  Internal Medicine   Contact information:   [place] [address] [phone]         Discharge Instructions  and  Discharge Medications     Discharge Instructions    Discharge instructions    Complete by:  As directed   Follow with Primary MD Binnie Rail, MD in 7 days   Get CBC, CMP,  checked  by Primary MD next visit.    Activity: As tolerated with Full fall precautions use walker/cane & assistance as needed   Disposition Home    Diet: Heart Healthy , with feeding assistance and aspiration precautions.  For Heart failure patients - Check your Weight same time everyday, if you gain over 2 pounds, or you develop in leg  swelling, experience more shortness of breath or chest pain, call your Primary MD immediately. Follow Cardiac Low Salt Diet and 1.5 lit/day fluid restriction.   On your next visit with your primary care physician please Get Medicines reviewed and adjusted.   Please request your Prim.MD to go over all Hospital Tests and Procedure/Radiological results at the follow up, please get all Hospital records sent to your Prim MD by signing hospital release before you go home.   If you experience worsening of your admission symptoms, develop shortness of breath, life threatening emergency, suicidal or homicidal thoughts you must seek medical attention immediately by calling 911 or calling your MD immediately  if symptoms less severe.  You Must read complete instructions/literature along with all the possible adverse reactions/side effects for all the Medicines you take and that have been prescribed to you. Take any new Medicines after you have completely understood and accpet all the possible adverse reactions/side effects.   Do not drive, operating heavy machinery, perform activities at heights, swimming or participation in water activities or provide baby sitting services if your were admitted for syncope or siezures until you have seen by Primary MD or a Neurologist and advised to do so again.  Do not drive when taking Pain medications.    Do not take more than prescribed Pain, Sleep and Anxiety Medications  Special Instructions: If you have smoked or chewed Tobacco  in the last 2 yrs please stop smoking, stop any regular Alcohol  and or any Recreational drug use.  Wear Seat belts while driving.   Please note  You were cared for by a hospitalist during your hospital stay. If you have any questions about your discharge medications or the care you received while you were in the hospital after you are discharged, you can call the unit and asked to speak with the hospitalist on call if the hospitalist  that took care of you is not available. Once you are discharged, your primary care physician will handle any further medical issues. Please note that NO REFILLS for any discharge medications will be authorized once you are discharged, as it is imperative that you return to your primary care physician (or establish a relationship with a primary care physician if you do not have one) for your aftercare needs so that they can reassess your need for medications and monitor your lab values.     Increase activity slowly    Complete by:  As directed  Medication List    STOP taking these medications        NONFORMULARY OR COMPOUNDED ITEM      TAKE these medications        amitriptyline 25 MG tablet  Commonly known as:  ELAVIL  Take 25 mg at night, increase to 50 mg at night after one week     aspirin EC 81 MG tablet  Take 1 tablet (81 mg total) by mouth daily.     cloNIDine 0.3 MG tablet  Commonly known as:  CATAPRES  Take 1 tablet (0.3 mg total) by mouth daily. Reported on 08/15/2015     fluticasone 50 MCG/ACT nasal spray  Commonly known as:  FLONASE  Place 1 spray into both nostrils daily as needed for allergies.     furosemide 80 MG tablet  Commonly known as:  LASIX  Take 1 tablet (80 mg total) by mouth daily.     gabapentin 800 MG tablet  Commonly known as:  NEURONTIN  Take 1 tablet (800 mg total) by mouth 3 (three) times daily.     hydrOXYzine 25 MG capsule  Commonly known as:  VISTARIL  Take 25 mg by mouth 3 (three) times daily. Reported on 08/15/2015     Ipratropium-Albuterol 20-100 MCG/ACT Aers respimat  Commonly known as:  COMBIVENT  Inhale 1 puff into the lungs every 6 (six) hours.     losartan 50 MG tablet  Commonly known as:  COZAAR  Take 1 tablet (50 mg total) by mouth 2 (two) times daily.     meloxicam 15 MG tablet  Commonly known as:  MOBIC  Take 1 tablet (15 mg total) by mouth daily.     metoprolol 200 MG 24 hr tablet  Commonly known as:   TOPROL-XL  Take 1 tablet (200 mg total) by mouth daily.     mirtazapine 30 MG tablet  Commonly known as:  REMERON  Take 30 mg by mouth at bedtime.     nitroGLYCERIN 0.4 mg/hr patch  Commonly known as:  NITRODUR - Dosed in mg/24 hr  Place 0.4 mg onto the skin as needed.     omeprazole 20 MG capsule  Commonly known as:  PRILOSEC  Take 1 capsule (20 mg total) by mouth daily.     potassium chloride SA 20 MEQ tablet  Commonly known as:  K-DUR,KLOR-CON  Take 1 tablet (20 mEq total) by mouth daily. Reported on 08/15/2015     tiZANidine 4 MG tablet  Commonly known as:  ZANAFLEX  Take 2.5 tablets (10 mg total) by mouth 2 (two) times daily as needed.     traZODone 50 MG tablet  Commonly known as:  DESYREL  Take 1 tablet (50 mg total) by mouth at bedtime.          Diet and Activity recommendation: See Discharge Instructions above   Consults obtained -  None   Major procedures and Radiology Reports - PLEASE review detailed and final reports for all details, in brief -     Dg Chest 2 View  10/31/2015  CLINICAL DATA:  Chest pain since noon today. EXAM: CHEST  2 VIEW COMPARISON:  None. FINDINGS: The cardiac silhouette, mediastinal and hilar contours are within normal limits. There is mild tortuosity of the thoracic aorta. The lungs are clear. No pleural effusions. The bony thorax is intact. IMPRESSION: No acute cardiopulmonary findings. Electronically Signed   By: Marijo Sanes M.D.   On: 10/31/2015 15:36    Micro Results  No results found for this or any previous visit (from the past 240 hour(s)).     Today   Subjective:   Aidel Bingaman today has no headache,no chest or abdominal pain,no new weakness tingling or numbness, feels much better wants to go home today.   Objective:   Blood pressure 149/79, pulse 67, temperature 98.3 F (36.8 C), temperature source Oral, resp. rate 18, height 5\' 7"  (1.702 m), weight 136.397 kg (300 lb 11.2 oz), SpO2 99  %.   Intake/Output Summary (Last 24 hours) at 11/01/15 1554 Last data filed at 11/01/15 0800  Gross per 24 hour  Intake    480 ml  Output   1100 ml  Net   -620 ml    Exam Awake Alert, Oriented x 3, No new F.N deficits, Normal affect [place].AT,PERRAL Supple Neck,No JVD, No cervical lymphadenopathy appriciated.  Symmetrical Chest wall movement, Good air movement bilaterally, CTAB RRR,No Gallops,Rubs or new Murmurs, No Parasternal Heave +ve B.Sounds, Abd Soft, Non tender, No organomegaly appriciated, No rebound -guarding or rigidity. No Cyanosis, Clubbing or edema, No new Rash or bruise  Data Review   CBC w Diff: Lab Results  Component Value Date   WBC 13.4* 11/01/2015   HGB 14.7 11/01/2015   HCT 44.5 11/01/2015   PLT 247 11/01/2015   LYMPHOPCT 34 10/31/2015   MONOPCT 9 10/31/2015   EOSPCT 2 10/31/2015   BASOPCT 0 10/31/2015    CMP: Lab Results  Component Value Date   NA 137 10/31/2015   K 4.0 10/31/2015   CL 104 10/31/2015   CO2 25 10/31/2015   BUN 15 10/31/2015   CREATININE 0.92 10/31/2015   CREATININE 0.73 08/15/2015   PROT 6.0* 10/31/2015   ALBUMIN 3.3* 10/31/2015   BILITOT 0.3 10/31/2015   ALKPHOS 97 10/31/2015   AST 34 10/31/2015   ALT 29 10/31/2015  .   Total Time in preparing paper work, data evaluation and todays exam - 35 minutes  Eythan Jayne M.D on 11/01/2015 at 3:54 PM  [facility]   Office  [phone]

## 2015-11-01 NOTE — Discharge Instructions (Signed)
Follow with Primary MD Stacy J Burns, MD in 7 days  ° °Get CBC, CMP,checked  by Primary MD next visit.  ° ° °Activity: As tolerated with Full fall precautions use walker/cane & assistance as needed ° ° °Disposition Home  ° ° °Diet: Heart Healthy  , with feeding assistance and aspiration precautions. ° °For Heart failure patients - Check your Weight same time everyday, if you gain over 2 pounds, or you develop in leg swelling, experience more shortness of breath or chest pain, call your Primary MD immediately. Follow Cardiac Low Salt Diet and 1.5 lit/day fluid restriction. ° ° °On your next visit with your primary care physician please Get Medicines reviewed and adjusted. ° ° °Please request your Prim.MD to go over all Hospital Tests and Procedure/Radiological results at the follow up, please get all Hospital records sent to your Prim MD by signing hospital release before you go home. ° ° °If you experience worsening of your admission symptoms, develop shortness of breath, life threatening emergency, suicidal or homicidal thoughts you must seek medical attention immediately by calling 911 or calling your MD immediately  if symptoms less severe. ° °You Must read complete instructions/literature along with all the possible adverse reactions/side effects for all the Medicines you take and that have been prescribed to you. Take any new Medicines after you have completely understood and accpet all the possible adverse reactions/side effects.  ° °Do not drive, operating heavy machinery, perform activities at heights, swimming or participation in water activities or provide baby sitting services if your were admitted for syncope or siezures until you have seen by Primary MD or a Neurologist and advised to do so again. ° °Do not drive when taking Pain medications.  ° ° °Do not take more than prescribed Pain, Sleep and Anxiety Medications ° °Special Instructions: If you have smoked or chewed Tobacco  in the last 2 yrs please  stop smoking, stop any regular Alcohol  and or any Recreational drug use. ° °Wear Seat belts while driving. ° ° °Please note ° °You were cared for by a hospitalist during your hospital stay. If you have any questions about your discharge medications or the care you received while you were in the hospital after you are discharged, you can call the unit and asked to speak with the hospitalist on call if the hospitalist that took care of you is not available. Once you are discharged, your primary care physician will handle any further medical issues. Please note that NO REFILLS for any discharge medications will be authorized once you are discharged, as it is imperative that you return to your primary care physician (or establish a relationship with a primary care physician if you do not have one) for your aftercare needs so that they can reassess your need for medications and monitor your lab values. ° °

## 2015-11-01 NOTE — Progress Notes (Signed)
Echocardiogram 2D Echocardiogram with Definity has been performed.  Tresa Res 11/01/2015, 3:04 PM

## 2015-11-01 NOTE — Progress Notes (Signed)
Pt threatening to leave AMA. Took off tele monitor. When reviewing AMA paperwork, pt decided she would wait "1 more hour" for the echo to be completed. Tele monitor remains off. Will continue to monitor.

## 2015-11-01 NOTE — Care Management Obs Status (Signed)
[facility] NOTIFICATION   Patient Details  Name: Tammy Strickland MRN: [id number] Date of Birth: 1960/07/23   Medicare Observation Status Notification Given:  Yes    Dawayne Patricia, RN 11/01/2015, 11:16 AM

## 2015-11-02 LAB — HEMOGLOBIN A1C
Hgb A1c MFr Bld: 8.3 % — ABNORMAL HIGH (ref 4.8–5.6)
Mean Plasma Glucose: 192 mg/dL

## 2015-11-05 ENCOUNTER — Other Ambulatory Visit: Payer: Self-pay | Admitting: Physician Assistant

## 2015-11-05 DIAGNOSIS — Z5900 Homelessness unspecified: Secondary | ICD-10-CM

## 2015-11-05 DIAGNOSIS — I10 Essential (primary) hypertension: Secondary | ICD-10-CM

## 2015-11-05 DIAGNOSIS — M25562 Pain in left knee: Secondary | ICD-10-CM

## 2015-11-05 DIAGNOSIS — E7800 Pure hypercholesterolemia, unspecified: Secondary | ICD-10-CM

## 2015-11-05 DIAGNOSIS — G622 Polyneuropathy due to other toxic agents: Secondary | ICD-10-CM

## 2015-11-06 ENCOUNTER — Observation Stay (HOSPITAL_COMMUNITY)
Admission: EM | Admit: 2015-11-06 | Discharge: 2015-11-08 | Disposition: A | Discharge status: Home / Self Care | Payer: Commercial Managed Care - HMO | Attending: Internal Medicine | Admitting: Internal Medicine

## 2015-11-06 ENCOUNTER — Encounter (HOSPITAL_COMMUNITY): Payer: Self-pay

## 2015-11-06 ENCOUNTER — Emergency Department (HOSPITAL_COMMUNITY): Payer: Commercial Managed Care - HMO

## 2015-11-06 DIAGNOSIS — F17210 Nicotine dependence, cigarettes, uncomplicated: Secondary | ICD-10-CM | POA: Diagnosis not present

## 2015-11-06 DIAGNOSIS — I152 Hypertension secondary to endocrine disorders: Secondary | ICD-10-CM | POA: Diagnosis present

## 2015-11-06 DIAGNOSIS — J449 Chronic obstructive pulmonary disease, unspecified: Secondary | ICD-10-CM | POA: Diagnosis present

## 2015-11-06 DIAGNOSIS — E1142 Type 2 diabetes mellitus with diabetic polyneuropathy: Secondary | ICD-10-CM | POA: Diagnosis not present

## 2015-11-06 DIAGNOSIS — L988 Other specified disorders of the skin and subcutaneous tissue: Secondary | ICD-10-CM

## 2015-11-06 DIAGNOSIS — G4700 Insomnia, unspecified: Secondary | ICD-10-CM | POA: Diagnosis present

## 2015-11-06 DIAGNOSIS — F329 Major depressive disorder, single episode, unspecified: Secondary | ICD-10-CM | POA: Insufficient documentation

## 2015-11-06 DIAGNOSIS — I2510 Atherosclerotic heart disease of native coronary artery without angina pectoris: Secondary | ICD-10-CM | POA: Diagnosis not present

## 2015-11-06 DIAGNOSIS — Z6841 Body Mass Index (BMI) 40.0 and over, adult: Secondary | ICD-10-CM | POA: Insufficient documentation

## 2015-11-06 DIAGNOSIS — I770 Arteriovenous fistula, acquired: Secondary | ICD-10-CM | POA: Diagnosis not present

## 2015-11-06 DIAGNOSIS — I6330 Cerebral infarction due to thrombosis of unspecified cerebral artery: Secondary | ICD-10-CM | POA: Diagnosis not present

## 2015-11-06 DIAGNOSIS — Z8673 Personal history of transient ischemic attack (TIA), and cerebral infarction without residual deficits: Secondary | ICD-10-CM | POA: Diagnosis not present

## 2015-11-06 DIAGNOSIS — I110 Hypertensive heart disease with heart failure: Secondary | ICD-10-CM | POA: Diagnosis not present

## 2015-11-06 DIAGNOSIS — I671 Cerebral aneurysm, nonruptured: Secondary | ICD-10-CM | POA: Diagnosis present

## 2015-11-06 DIAGNOSIS — G629 Polyneuropathy, unspecified: Secondary | ICD-10-CM

## 2015-11-06 DIAGNOSIS — Z7982 Long term (current) use of aspirin: Secondary | ICD-10-CM | POA: Insufficient documentation

## 2015-11-06 DIAGNOSIS — R404 Transient alteration of awareness: Secondary | ICD-10-CM | POA: Diagnosis not present

## 2015-11-06 DIAGNOSIS — R202 Paresthesia of skin: Secondary | ICD-10-CM | POA: Diagnosis not present

## 2015-11-06 DIAGNOSIS — G4489 Other headache syndrome: Secondary | ICD-10-CM | POA: Diagnosis not present

## 2015-11-06 DIAGNOSIS — I10 Essential (primary) hypertension: Secondary | ICD-10-CM | POA: Diagnosis present

## 2015-11-06 DIAGNOSIS — E785 Hyperlipidemia, unspecified: Secondary | ICD-10-CM | POA: Insufficient documentation

## 2015-11-06 DIAGNOSIS — G9389 Other specified disorders of brain: Secondary | ICD-10-CM | POA: Diagnosis not present

## 2015-11-06 DIAGNOSIS — M1990 Unspecified osteoarthritis, unspecified site: Secondary | ICD-10-CM | POA: Diagnosis not present

## 2015-11-06 DIAGNOSIS — K219 Gastro-esophageal reflux disease without esophagitis: Secondary | ICD-10-CM | POA: Diagnosis not present

## 2015-11-06 DIAGNOSIS — M1711 Unilateral primary osteoarthritis, right knee: Secondary | ICD-10-CM | POA: Insufficient documentation

## 2015-11-06 DIAGNOSIS — F419 Anxiety disorder, unspecified: Secondary | ICD-10-CM | POA: Diagnosis not present

## 2015-11-06 DIAGNOSIS — I5032 Chronic diastolic (congestive) heart failure: Secondary | ICD-10-CM

## 2015-11-06 DIAGNOSIS — M797 Fibromyalgia: Secondary | ICD-10-CM | POA: Diagnosis not present

## 2015-11-06 DIAGNOSIS — I639 Cerebral infarction, unspecified: Secondary | ICD-10-CM

## 2015-11-06 DIAGNOSIS — Z791 Long term (current) use of non-steroidal anti-inflammatories (NSAID): Secondary | ICD-10-CM | POA: Insufficient documentation

## 2015-11-06 DIAGNOSIS — I6501 Occlusion and stenosis of right vertebral artery: Secondary | ICD-10-CM | POA: Diagnosis not present

## 2015-11-06 DIAGNOSIS — R4781 Slurred speech: Secondary | ICD-10-CM | POA: Diagnosis not present

## 2015-11-06 DIAGNOSIS — G459 Transient cerebral ischemic attack, unspecified: Principal | ICD-10-CM | POA: Insufficient documentation

## 2015-11-06 DIAGNOSIS — Z79899 Other long term (current) drug therapy: Secondary | ICD-10-CM | POA: Diagnosis not present

## 2015-11-06 DIAGNOSIS — R531 Weakness: Secondary | ICD-10-CM | POA: Diagnosis not present

## 2015-11-06 DIAGNOSIS — R51 Headache: Secondary | ICD-10-CM | POA: Diagnosis not present

## 2015-11-06 LAB — PROTIME-INR
INR: 1.05
Prothrombin Time: 13.7 seconds (ref 11.4–15.2)

## 2015-11-06 LAB — CBC
HCT: 43.3 % (ref 36.0–46.0)
Hemoglobin: 14 g/dL (ref 12.0–15.0)
MCH: 27.7 pg (ref 26.0–34.0)
MCHC: 32.3 g/dL (ref 30.0–36.0)
MCV: 85.7 fL (ref 78.0–100.0)
Platelets: 296 10*3/uL (ref 150–400)
RBC: 5.05 MIL/uL (ref 3.87–5.11)
RDW: 14.5 % (ref 11.5–15.5)
WBC: 8.1 10*3/uL (ref 4.0–10.5)

## 2015-11-06 LAB — DIFFERENTIAL
Basophils Absolute: 0 10*3/uL (ref 0.0–0.1)
Basophils Relative: 0 %
Eosinophils Absolute: 0.3 10*3/uL (ref 0.0–0.7)
Eosinophils Relative: 4 %
Lymphocytes Relative: 48 %
Lymphs Abs: 3.9 10*3/uL (ref 0.7–4.0)
Monocytes Absolute: 0.4 10*3/uL (ref 0.1–1.0)
Monocytes Relative: 5 %
Neutro Abs: 3.5 10*3/uL (ref 1.7–7.7)
Neutrophils Relative %: 43 %

## 2015-11-06 LAB — I-STAT CHEM 8, ED
BUN: 11 mg/dL (ref 6–20)
Calcium, Ion: 1.16 mmol/L (ref 1.13–1.30)
Chloride: 101 mmol/L (ref 101–111)
Creatinine, Ser: 0.7 mg/dL (ref 0.44–1.00)
Glucose, Bld: 189 mg/dL — ABNORMAL HIGH (ref 65–99)
HCT: 43 % (ref 36.0–46.0)
Hemoglobin: 14.6 g/dL (ref 12.0–15.0)
Potassium: 4.1 mmol/L (ref 3.5–5.1)
Sodium: 141 mmol/L (ref 135–145)
TCO2: 28 mmol/L (ref 0–100)

## 2015-11-06 LAB — APTT: aPTT: 35 seconds (ref 24–36)

## 2015-11-06 LAB — COMPREHENSIVE METABOLIC PANEL
ALT: 27 U/L (ref 14–54)
AST: 27 U/L (ref 15–41)
Albumin: 3.3 g/dL — ABNORMAL LOW (ref 3.5–5.0)
Alkaline Phosphatase: 110 U/L (ref 38–126)
Anion gap: 6 (ref 5–15)
BUN: 10 mg/dL (ref 6–20)
CO2: 27 mmol/L (ref 22–32)
Calcium: 9 mg/dL (ref 8.9–10.3)
Chloride: 106 mmol/L (ref 101–111)
Creatinine, Ser: 0.82 mg/dL (ref 0.44–1.00)
GFR calc Af Amer: >60 mL/min (ref >60)
GFR calc non Af Amer: >60 mL/min (ref >60)
Glucose, Bld: 187 mg/dL — ABNORMAL HIGH (ref 65–99)
Potassium: 4.4 mmol/L (ref 3.5–5.1)
Sodium: 139 mmol/L (ref 135–145)
Total Bilirubin: 0.2 mg/dL — ABNORMAL LOW (ref 0.3–1.2)
Total Protein: 6.9 g/dL (ref 6.5–8.1)

## 2015-11-06 LAB — CBG MONITORING, ED
Glucose-Capillary: 192 mg/dL — ABNORMAL HIGH (ref 65–99)
Glucose-Capillary: 147 mg/dL — ABNORMAL HIGH (ref 65–99)

## 2015-11-06 LAB — I-STAT TROPONIN, ED: Troponin i, poc: 0 ng/mL (ref 0.00–0.08)

## 2015-11-06 MED ORDER — ONDANSETRON HCL 4 MG/2ML IJ SOLN: 4.0000 mg | Freq: Four times a day (QID) | INTRAMUSCULAR | Status: DC | PRN

## 2015-11-06 MED ORDER — SODIUM CHLORIDE 0.9% FLUSH
3.0000 mL | Freq: Two times a day (BID) | INTRAVENOUS | Status: DC
Start: 2015-11-06 — End: 2015-11-08
  Administered 2015-11-06 – 2015-11-08 (×2): 3 mL via INTRAVENOUS

## 2015-11-06 MED ORDER — PANTOPRAZOLE SODIUM 40 MG PO TBEC
40.0000 mg | DELAYED_RELEASE_TABLET | Freq: Every day | ORAL | Status: DC
Start: 2015-11-07 — End: 2015-11-08
  Administered 2015-11-07 – 2015-11-08 (×2): 40 mg via ORAL
  Filled 2015-11-06 (×2): qty 1

## 2015-11-06 MED ORDER — TIZANIDINE HCL 4 MG PO TABS
8.0000 mg | ORAL_TABLET | Freq: Three times a day (TID) | ORAL | Status: DC
  Administered 2015-11-06 – 2015-11-08 (×6): 8 mg via ORAL
  Filled 2015-11-06 (×6): qty 2

## 2015-11-06 MED ORDER — LOSARTAN POTASSIUM 50 MG PO TABS
50.0000 mg | ORAL_TABLET | Freq: Two times a day (BID) | ORAL | Status: DC
  Administered 2015-11-06 – 2015-11-08 (×4): 50 mg via ORAL
  Filled 2015-11-06 (×4): qty 1

## 2015-11-06 MED ORDER — ACETAMINOPHEN 650 MG RE SUPP: 650.0000 mg | Freq: Four times a day (QID) | RECTAL | Status: DC | PRN

## 2015-11-06 MED ORDER — IOPAMIDOL (ISOVUE-370) INJECTION 76%
INTRAVENOUS | Status: AC
  Administered 2015-11-06: 50 mL
  Filled 2015-11-06: qty 50

## 2015-11-06 MED ORDER — AMITRIPTYLINE HCL 25 MG PO TABS
50.0000 mg | ORAL_TABLET | Freq: Every day | ORAL | Status: DC
  Administered 2015-11-06 – 2015-11-07 (×2): 50 mg via ORAL
  Filled 2015-11-06 (×2): qty 2

## 2015-11-06 MED ORDER — STROKE: EARLY STAGES OF RECOVERY BOOK
Freq: Once | Status: AC
  Administered 2015-11-06: 18:00:00
  Filled 2015-11-06: qty 1

## 2015-11-06 MED ORDER — POTASSIUM CHLORIDE CRYS ER 20 MEQ PO TBCR
20.0000 meq | EXTENDED_RELEASE_TABLET | Freq: Every day | ORAL | Status: DC
  Administered 2015-11-07 – 2015-11-08 (×2): 20 meq via ORAL
  Filled 2015-11-06 (×2): qty 1

## 2015-11-06 MED ORDER — CLONIDINE HCL 0.1 MG PO TABS
0.3000 mg | ORAL_TABLET | Freq: Every day | ORAL | Status: DC
  Administered 2015-11-07 – 2015-11-08 (×2): 0.3 mg via ORAL
  Filled 2015-11-06 (×2): qty 3

## 2015-11-06 MED ORDER — GABAPENTIN 800 MG PO TABS
800.0000 mg | ORAL_TABLET | Freq: Three times a day (TID) | ORAL | Status: DC
  Filled 2015-11-06 (×2): qty 1

## 2015-11-06 MED ORDER — TRAZODONE HCL 50 MG PO TABS
50.0000 mg | ORAL_TABLET | Freq: Every day | ORAL | Status: DC
  Administered 2015-11-06 – 2015-11-07 (×2): 50 mg via ORAL
  Filled 2015-11-06 (×2): qty 1

## 2015-11-06 MED ORDER — OXYCODONE HCL 5 MG PO TABS
7.5000 mg | ORAL_TABLET | Freq: Four times a day (QID) | ORAL | Status: DC | PRN
  Administered 2015-11-06 – 2015-11-08 (×3): 7.5 mg via ORAL
  Filled 2015-11-06 (×3): qty 2

## 2015-11-06 MED ORDER — ASPIRIN EC 81 MG PO TBEC
81.0000 mg | DELAYED_RELEASE_TABLET | Freq: Every day | ORAL | Status: DC
  Administered 2015-11-07 – 2015-11-08 (×2): 81 mg via ORAL
  Filled 2015-11-06 (×2): qty 1

## 2015-11-06 MED ORDER — METOPROLOL SUCCINATE ER 100 MG PO TB24
200.0000 mg | ORAL_TABLET | Freq: Every day | ORAL | Status: DC
  Administered 2015-11-07 – 2015-11-08 (×2): 200 mg via ORAL
  Filled 2015-11-06 (×2): qty 2

## 2015-11-06 MED ORDER — MELOXICAM 7.5 MG PO TABS
15.0000 mg | ORAL_TABLET | Freq: Every day | ORAL | Status: DC
  Administered 2015-11-07 – 2015-11-08 (×2): 15 mg via ORAL
  Filled 2015-11-06 (×2): qty 2

## 2015-11-06 MED ORDER — FUROSEMIDE 80 MG PO TABS
80.0000 mg | ORAL_TABLET | Freq: Every day | ORAL | Status: DC
  Administered 2015-11-07 – 2015-11-08 (×2): 80 mg via ORAL
  Filled 2015-11-06 (×2): qty 1

## 2015-11-06 MED ORDER — HYDROXYZINE PAMOATE 25 MG PO CAPS
25.0000 mg | ORAL_CAPSULE | Freq: Three times a day (TID) | ORAL | Status: DC
  Filled 2015-11-06 (×2): qty 1

## 2015-11-06 MED ORDER — IPRATROPIUM-ALBUTEROL 0.5-2.5 (3) MG/3ML IN SOLN: 3.0000 mL | Freq: Four times a day (QID) | RESPIRATORY_TRACT | Status: DC | PRN

## 2015-11-06 MED ORDER — GABAPENTIN 400 MG PO CAPS
800.0000 mg | ORAL_CAPSULE | Freq: Three times a day (TID) | ORAL | Status: DC
  Administered 2015-11-06 – 2015-11-08 (×6): 800 mg via ORAL
  Filled 2015-11-06 (×6): qty 2

## 2015-11-06 MED ORDER — ONDANSETRON HCL 4 MG PO TABS: 4.0000 mg | ORAL_TABLET | Freq: Four times a day (QID) | ORAL | Status: DC | PRN

## 2015-11-06 MED ORDER — SODIUM CHLORIDE 0.9 % IV SOLN: 250.0000 mL | INTRAVENOUS | Status: DC | PRN

## 2015-11-06 MED ORDER — SODIUM CHLORIDE 0.9% FLUSH
3.0000 mL | Freq: Two times a day (BID) | INTRAVENOUS | Status: DC
  Administered 2015-11-06 – 2015-11-08 (×3): 3 mL via INTRAVENOUS

## 2015-11-06 MED ORDER — SODIUM CHLORIDE 0.9% FLUSH: 3.0000 mL | INTRAVENOUS | Status: DC | PRN

## 2015-11-06 MED ORDER — ACETAMINOPHEN 325 MG PO TABS: 650.0000 mg | ORAL_TABLET | Freq: Four times a day (QID) | ORAL | Status: DC | PRN

## 2015-11-06 MED ORDER — MIRTAZAPINE 15 MG PO TABS
30.0000 mg | ORAL_TABLET | Freq: Every day | ORAL | Status: DC
  Administered 2015-11-06 – 2015-11-07 (×2): 30 mg via ORAL
  Filled 2015-11-06 (×2): qty 2

## 2015-11-06 MED ORDER — HYDROXYZINE HCL 25 MG PO TABS
25.0000 mg | ORAL_TABLET | Freq: Three times a day (TID) | ORAL | Status: DC
  Administered 2015-11-06 – 2015-11-08 (×6): 25 mg via ORAL
  Filled 2015-11-06 (×6): qty 1

## 2015-11-06 NOTE — H&P (Signed)
History and Physical    Ramyah Witcher [id number] DOB: [birth year] DOA: 11/06/2015  PCP: Binnie Rail, MD Patient coming from: home  Chief Complaint: HA  HPI: Kierston Wair is a 55 y.o. female with medical history significant of anxiety, COPD, CAD, depression, diabetes, GERD, HTN presenting with headache with associated slurred speech and decreased sensation of the left arm. Started when patient awoke abruptly 9 AM.. Frontal. Currently patient with continued headache but no longer with slurred speech or change in sensation of the arm. Denies any chest pain, palpitations, nausea, vomiting, dysuria, frequency, cough, neck stiffness..     ED Course: The patient's presenting complaints code stroke was called. Patient received immediate neurological evaluation. Patient initially admitted to the neuro team subsequent transfer of care to the [facility] as primary. She done after MRI and MRA were obtained which show the patient had not had a stroke.  Review of Systems: As per HPI otherwise 10 point review of systems negative.   Ambulatory Status: no restrictions  Past Medical History:  Diagnosis Date  . Anxiety   . Arthritis   . COPD (chronic obstructive pulmonary disease) ([place])   . Coronary artery disease   . Depression   . Diabetes mellitus without complication ([place])   . GERD (gastroesophageal reflux disease)   . Hypertension   . Neuromuscular disorder ([place])   . Osteoporosis     Past Surgical History:  Procedure Laterality Date  . ABDOMINAL HYSTERECTOMY    . APPENDECTOMY    . CHOLECYSTECTOMY    . TONSILLECTOMY      Social History   Social History  . Marital status: Single    Spouse name: n/a  . Number of children: 3  . Years of education: 12+   Occupational History  . 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 Smoking status: Current Every Day Smoker    Packs/day:  1.00    Years: 35.00    Types: Cigarettes  . Smokeless tobacco: Never Used     Comment: referred  to smoking  cessation  classes. at  [facility]   . Alcohol use No  . 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   Marland Kitchen Sexual activity: Yes    Partners: Female   Other Topics Concern  . Not on file   Social History Narrative   Moved to [place] from [place], [place] February 2017, to help her daughter.   Lives with her daughter.   Sons live in [place] and [place].   She reports that there were originally 17 children in her family (she is the youngest), the oldest are deceased, some prior to her birth, and she isn't sure which were female/female or how they died.    No Known Allergies  Family History  Problem Relation Age of Onset  . Hyperlipidemia Mother   . Hypertension Mother   . Hyperlipidemia Father   . Hypertension Father   . Stroke Father   . Hypertension Sister   . Cancer Sister     breast cancer  . Crohn's disease Sister     Prior to Admission medications   Medication Sig Start Date End Date Taking? Authorizing Provider  amitriptyline (ELAVIL) 25 MG tablet Take 25 mg at night, increase to 50 mg at night after one week Patient taking differently: Take 50 mg by mouth at bedtime. Take 25 mg at night, increase to 50 mg  at night after one week 10/10/15  Yes Binnie Rail, MD  aspirin EC 81 MG tablet Take 1 tablet (81 mg total) by mouth daily. 11/01/15  Yes Albertine Patricia, MD  cloNIDine (CATAPRES) 0.3 MG tablet Take 1 tablet (0.3 mg total) by mouth daily. Reported on 08/15/2015 10/10/15  Yes Binnie Rail, MD  fluticasone (FLONASE) 50 MCG/ACT nasal spray Place 1 spray into both nostrils daily as needed for allergies.    Yes Historical Provider, MD  furosemide (LASIX) 80 MG tablet Take 1 tablet (80 mg total) by mouth daily. 10/10/15  Yes Binnie Rail, MD  gabapentin (NEURONTIN) 800 MG tablet Take 1 tablet (800 mg total) by mouth 3 (three) times daily. 08/29/15  Yes Chelle Jeffery, PA-C    hydrOXYzine (VISTARIL) 25 MG capsule Take 25 mg by mouth 3 (three) times daily. Reported on 08/15/2015   Yes Historical Provider, MD  Ipratropium-Albuterol (COMBIVENT) 20-100 MCG/ACT AERS respimat Inhale 1 puff into the lungs every 6 (six) hours. Patient taking differently: Inhale 1 puff into the lungs every 6 (six) hours as needed.  10/10/15  Yes Binnie Rail, MD  losartan (COZAAR) 50 MG tablet Take 1 tablet (50 mg total) by mouth 2 (two) times daily. 08/29/15  Yes Chelle Jeffery, PA-C  meloxicam (MOBIC) 15 MG tablet Take 1 tablet (15 mg total) by mouth daily. 08/29/15  Yes Chelle Jeffery, PA-C  metoprolol (TOPROL-XL) 200 MG 24 hr tablet Take 1 tablet (200 mg total) by mouth daily. 10/10/15  Yes Binnie Rail, MD  mirtazapine (REMERON) 30 MG tablet Take 30 mg by mouth at bedtime. 09/02/15  Yes Historical Provider, MD  nitroGLYCERIN (NITROSTAT) 0.4 MG SL tablet Place 0.4 mg under the tongue every 5 (five) minutes as needed for chest pain.   Yes Historical Provider, MD  omeprazole (PRILOSEC) 20 MG capsule Take 1 capsule (20 mg total) by mouth daily. 08/29/15  Yes Chelle Jeffery, PA-C  potassium chloride SA (K-DUR,KLOR-CON) 20 MEQ tablet Take 1 tablet (20 mEq total) by mouth daily. Reported on 08/15/2015 10/10/15  Yes Binnie Rail, MD  tiZANidine (ZANAFLEX) 4 MG tablet Take 2.5 tablets (10 mg total) by mouth 2 (two) times daily as needed. Patient taking differently: Take 8 mg by mouth 3 (three) times daily.  08/29/15  Yes Chelle Jeffery, PA-C  traZODone (DESYREL) 50 MG tablet Take 1 tablet (50 mg total) by mouth at bedtime. 08/29/15  Yes Chelle Jeffery, PA-C  nitroGLYCERIN (NITRODUR - DOSED IN MG/24 HR) 0.4 mg/hr patch Place 0.4 mg onto the skin as needed.    Historical Provider, MD    Physical Exam: Vitals:   11/06/15 1215 11/06/15 1230 11/06/15 1445 11/06/15 1527  BP: 97/64 99/60 111/69 127/90  Pulse: 62 64 (!) 59 61  Resp: 15 18 16 16   Temp:    97.7 F (36.5 C)  TempSrc:    Oral  SpO2: 98% 97% 95%  100%  Weight:      Height:         General:  Appears calm and comfortable Eyes:  PERRL, EOMI, normal lids, iris ENT:  grossly normal hearing, lips & tongue, mmm Neck:  no LAD, masses or thyromegaly Respiratory:  Normal respiratory effort. Abdomen:  soft, ntnd, NABS Skin:  no rash or induration seen on limited exam Musculoskeletal:  grossly normal tone BUE/BLE, good ROM, no bony abnormality Psychiatric:  grossly normal mood and affect, speech fluent and appropriate, AOx3 Neurologic:  CN 2-12 grossly intact, moves all extremities  in coordinated fashion, sensation intact  Labs on Admission: I have personally reviewed following labs and imaging studies  CBC:  Recent Labs Lab 10/31/15 1600 11/01/15 0156 11/06/15 1122 11/06/15 1137  WBC 11.9* 13.4* 8.1  --   NEUTROABS 6.6  --  3.5  --   HGB 15.2* 14.7 14.0 14.6  HCT 45.3 44.5 43.3 43.0  MCV 83.1 84.8 85.7  --   PLT PLATELET CLUMPS NOTED ON SMEAR, COUNT APPEARS ADEQUATE 247 296  --    Basic Metabolic Panel:  Recent Labs Lab 10/31/15 1742 11/06/15 1122 11/06/15 1137  NA 137 139 141  K 4.0 4.4 4.1  CL 104 106 101  CO2 25 27  --   GLUCOSE 142* 187* 189*  BUN 15 10 11   CREATININE 0.92 0.82 0.70  CALCIUM 8.9 9.0  --    GFR: Estimated Creatinine Clearance: 119.7 mL/min (by C-G formula based on SCr of 0.8 mg/dL). Liver Function Tests:  Recent Labs Lab 10/31/15 1742 11/06/15 1122  AST 34 27  ALT 29 27  ALKPHOS 97 110  BILITOT 0.3 0.2*  PROT 6.0* 6.9  ALBUMIN 3.3* 3.3*    Recent Labs Lab 10/31/15 1742  LIPASE 18   No results for input(s): AMMONIA in the last 168 hours. Coagulation Profile:  Recent Labs Lab 11/06/15 1122  INR 1.05   Cardiac Enzymes:  Recent Labs Lab 10/31/15 2044 11/01/15 0156  TROPONINI <0.03 <0.03   BNP (last 3 results) No results for input(s): PROBNP in the last 8760 hours. HbA1C: No results for input(s): HGBA1C in the last 72 hours. CBG:  Recent Labs Lab 11/06/15 1117  11/06/15 1209  GLUCAP 192* 147*   Lipid Profile: No results for input(s): CHOL, HDL, LDLCALC, TRIG, CHOLHDL, LDLDIRECT in the last 72 hours. Thyroid Function Tests: No results for input(s): TSH, T4TOTAL, FREET4, T3FREE, THYROIDAB in the last 72 hours. Anemia Panel: No results for input(s): VITAMINB12, FOLATE, FERRITIN, TIBC, IRON, RETICCTPCT in the last 72 hours. Urine analysis: No results found for: COLORURINE, APPEARANCEUR, LABSPEC, PHURINE, GLUCOSEU, HGBUR, BILIRUBINUR, KETONESUR, PROTEINUR, UROBILINOGEN, NITRITE, LEUKOCYTESUR  Creatinine Clearance: Estimated Creatinine Clearance: 119.7 mL/min (by C-G formula based on SCr of 0.8 mg/dL).  Sepsis Labs: @LABRCNTIP (procalcitonin:4,lacticidven:4) )No results found for this or any previous visit (from the past 240 hour(s)).   Radiological Exams on Admission: Ct Angio Head W Or Wo Contrast  Result Date: 11/06/2015 CLINICAL DATA:  Sudden onset of headache and slurred speech. Code stroke. Abnormal CT head. History diabetes and hypertension. EXAM: CT ANGIOGRAPHY HEAD AND NECK TECHNIQUE: Multidetector CT imaging of the head and neck was performed using the standard protocol during bolus administration of intravenous contrast. Multiplanar CT image reconstructions and MIPs were obtained to evaluate the vascular anatomy. Carotid stenosis measurements (when applicable) are obtained utilizing NASCET criteria, using the distal internal carotid diameter as the denominator. CONTRAST:  50 mL Isovue 370 IV COMPARISON:  CT head 11/06/2015 FINDINGS: CTA NECK Aortic arch: Normal aortic arch. Proximal great vessels widely patent without stenosis. Lung apices clear. Right carotid system: Right carotid artery is widely patent without stenosis. No atherosclerotic disease or dissection. Vascular tortuosity noted. Left carotid system: Left carotid artery widely patent without stenosis. No dissection or atherosclerotic disease. Vascular tortuosity noted. Vertebral  arteries:Both vertebral arteries are patent to the basilar without stenosis or dissection. Skeleton: No acute skeletal abnormality. Minimal degenerative change in the cervical spine. Other neck: Negative for mass or adenopathy in the neck. CTA HEAD Anterior circulation: Left cavernous carotid widely patent.  There is early filling of the left cavernous sinus which is asymmetric. No significant contrast in the right cavernous sinus. In addition, the superior ophthalmic vein has dense enhancement and is distended. Dense contrast also in the veins of the superior orbit and left face. Findings compatible with cavernous carotid fistula. No significant atherosclerotic disease in the cavernous carotid. No definite aneurysm identified. Left cavernous carotid widely patent. Right anterior and middle cerebral arteries widely patent. Left anterior middle cerebral arteries widely patent without stenosis. Posterior circulation: Both vertebral arteries patent to the basilar. PICA, superior cerebellar, posterior cerebral arteries patent. Fetal origin left posterior cerebral artery with hypoplastic left P1 segment. 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 This may be a spontaneous fistula. No underlying atherosclerotic disease or aneurysm identified. Catheter angiogram is suggested. No significant carotid or vertebral artery stenosis in the neck. I reviewed the images with Dr. Tasia Catchings at approximately 1210 hours on 11/06/2015. Electronically Signed   By: Franchot Gallo M.D.   On: 11/06/2015 12:40  Ct Angio Neck W Or Wo Contrast  Result Date: 11/06/2015 CLINICAL DATA:  Sudden onset of headache and slurred speech. Code stroke. Abnormal CT head. History diabetes and hypertension. EXAM: CT ANGIOGRAPHY HEAD AND NECK TECHNIQUE: Multidetector CT imaging of the head and neck was performed using the standard protocol during  bolus administration of intravenous contrast. Multiplanar CT image reconstructions and MIPs were obtained to evaluate the vascular anatomy. Carotid stenosis measurements (when applicable) are obtained utilizing NASCET criteria, using the distal internal carotid diameter as the denominator. CONTRAST:  50 mL Isovue 370 IV COMPARISON:  CT head 11/06/2015 FINDINGS: CTA NECK Aortic arch: Normal aortic arch. Proximal great vessels widely patent without stenosis. Lung apices clear. Right carotid system: Right carotid artery is widely patent without stenosis. No atherosclerotic disease or dissection. Vascular tortuosity noted. Left carotid system: Left carotid artery widely patent without stenosis. No dissection or atherosclerotic disease. Vascular tortuosity noted. Vertebral arteries:Both vertebral arteries are patent to the basilar without stenosis or dissection. Skeleton: No acute skeletal abnormality. Minimal degenerative change in the cervical spine. Other neck: Negative for mass or adenopathy in the neck. CTA HEAD Anterior circulation: Left cavernous carotid widely patent. There is early filling of the left cavernous sinus which is asymmetric. No significant contrast in the right cavernous sinus. In addition, the superior ophthalmic vein has dense enhancement and is distended. Dense contrast also in the veins of the superior orbit and left face. Findings compatible with cavernous carotid fistula. No significant atherosclerotic disease in the cavernous carotid. No definite aneurysm identified. Left cavernous carotid widely patent. Right anterior and middle cerebral arteries widely patent. Left anterior middle cerebral arteries widely patent without stenosis. Posterior circulation: Both vertebral arteries patent to the basilar. PICA, superior cerebellar, posterior cerebral arteries patent. Fetal origin left posterior cerebral artery with hypoplastic left P1 segment. 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 This may be a spontaneous fistula. No underlying atherosclerotic disease or aneurysm identified. Catheter angiogram is suggested. No significant carotid or vertebral artery stenosis in the neck. I reviewed the images with Dr. Tasia Catchings at approximately 1210 hours on 11/06/2015. Electronically Signed   By: Franchot Gallo M.D.   On: 11/06/2015 12:40  Mr Angiogram Head Wo Contrast  Result Date: 11/06/2015 CLINICAL DATA:  Headache. Slurred speech. Last seen normal, earlier today. EXAM: MRI HEAD WITHOUT  CONTRAST MRA HEAD WITHOUT CONTRAST TECHNIQUE: Multiplanar, multiecho pulse sequences of the brain and surrounding structures were obtained without intravenous contrast. Angiographic images of the head were obtained using MRA technique without contrast. COMPARISON:  None. CTA head neck earlier today. FINDINGS: MRI HEAD FINDINGS No acute stroke, acute hemorrhage, mass lesion, hydrocephalus, or extra-axial fluid. Normal cerebral volume. No white matter disease. Asymmetrically prominent cavernous and supraclinoid ICA on the LEFT. Bulging LEFT cavernous sinus consistent with the CT diagnosis of CC fistula. Prominent supraorbital vein on the LEFT. Mild LEFT proptosis. Arterial flow voids are maintained. Pituitary, pineal, and cerebellar tonsils unremarkable. No upper cervical lesions. Extracranial soft tissues unremarkable. MRA HEAD FINDINGS Both internal carotid arteries are widely patent, with the LEFT larger, particularly in its cavernous and supraclinoid segments. Direct communication to the cavernous sinus is not established on this noncontrast MRA technique. The supraclinoid ICA and ICA terminus are more vertically oriented on the LEFT than RIGHT, likely related to the fistula. No intracranial anterior circulation stenosis or large vessel occlusion. Normal basilar artery. Vertebrals both contribute to formation of basilar,  with a estimated 50-75% stenosis of the distal V4 segment on the RIGHT. Fetal origin LEFT PCA. IMPRESSION: No evidence for acute stroke or mass lesion. No cause seen for slurred speech. 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 LEFT ICA larger than the RIGHT related to shunt physiology. Exact site of communication is not established. 50-75% stenosis distal V4 segment, RIGHT vertebral. Electronically Signed   By: Staci Righter M.D.   On: 11/06/2015 15:27  Mr Brain Wo Contrast  Result Date: 11/06/2015 CLINICAL DATA:  Headache. Slurred speech. Last seen normal, earlier today. EXAM: MRI HEAD WITHOUT CONTRAST MRA HEAD WITHOUT CONTRAST TECHNIQUE: Multiplanar, multiecho pulse sequences of the brain and surrounding structures were obtained without intravenous contrast. Angiographic images of the head were obtained using MRA technique without contrast. COMPARISON:  None. CTA head neck earlier today. FINDINGS: MRI HEAD FINDINGS No acute stroke, acute hemorrhage, mass lesion, hydrocephalus, or extra-axial fluid. Normal cerebral volume. No white matter disease. Asymmetrically prominent cavernous and supraclinoid ICA on the LEFT. Bulging LEFT cavernous sinus consistent with the CT diagnosis of CC fistula. Prominent supraorbital vein on the LEFT. Mild LEFT proptosis. Arterial flow voids are maintained. Pituitary, pineal, and cerebellar tonsils unremarkable. No upper cervical lesions. Extracranial soft tissues unremarkable. MRA HEAD FINDINGS Both internal carotid arteries are widely patent, with the LEFT larger, particularly in its cavernous and supraclinoid segments. Direct communication to the cavernous sinus is not established on this noncontrast MRA technique. The supraclinoid ICA and ICA terminus are more vertically oriented on the LEFT than RIGHT, likely related to the fistula. No intracranial anterior circulation stenosis or large vessel occlusion. Normal  basilar artery. Vertebrals both contribute to formation of basilar, with a estimated 50-75% stenosis of the distal V4 segment on the RIGHT. Fetal origin LEFT PCA. IMPRESSION: No evidence for acute stroke or mass lesion. No cause seen for slurred speech. 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 LEFT ICA larger than the RIGHT related to shunt physiology. Exact site of communication is not established. 50-75% stenosis distal V4 segment, RIGHT vertebral. Electronically Signed   By: Staci Righter M.D.   On: 11/06/2015 15:27  Ct Head Code Stroke Wo Contrast`  Addendum Date: 11/06/2015   ADDENDUM REPORT: 11/06/2015 11:57 ADDENDUM: Critical Value/emergent results were called by telephone at the time of interpretation on 11/06/2015 at 11:56  am to Dr. Elson Clan, who verbally acknowledged these results. Electronically Signed   By: Lowella Grip III M.D.   On: 11/06/2015 11:57  Result Date: 11/06/2015 CLINICAL DATA:  Acute onset headache with slurred speech and sensory deficits EXAM: CT HEAD WITHOUT CONTRAST TECHNIQUE: Contiguous axial images were obtained from the base of the skull through the vertex without intravenous contrast. COMPARISON:  None. FINDINGS: The ventricles are normal in size and configuration. There is no intracranial mass, hemorrhage, extra-axial fluid collection, or midline shift. Gray-white compartments appear normal. No acute infarct is evident currently. There is a focal area of hyperdensity in the distal most aspect of the left internal carotid artery at the junction with the origin of the left middle cerebral artery. No other hyperdensity seen. Vascular calcification is evident. The bony calvarium appears intact. Mastoid air cells are clear. Visualized paranasal sinuses are normal. Orbits appear symmetric bilaterally. A metallic foreign body is noted adjacent to the right orbit laterally, a cosmetic ring. IMPRESSION: Focal  hyperdensity in the distal most aspect of the left internal carotid artery extending into the origin of the left middle cerebral artery. This finding is best appreciated on axial slices 11 and 12, series 2. This finding raises concern for localized vessel thrombus with earliest changes of infarct on the left. No gray-white compartment lesions are currently seen. No hemorrhage. Study otherwise unremarkable. Electronically Signed: By: Lowella Grip III M.D. On: 11/06/2015 11:54    Assessment/Plan Active Problems:   Benign essential HTN   Peripheral neuropathy (HCC)   Arthritis   CAD (coronary artery disease)   COPD (chronic obstructive pulmonary disease) (HCC)   GERD (gastroesophageal reflux disease)   Insomnia   Carotid-cavernous fistula (HCC)   Slurred speech   Chronic diastolic congestive heart failure (HCC)   TIA/MIgraine w/ aura: Patient's symptoms have resolved with the exception of a residual headache. Code stroke canceled after initial evaluation. MRI showing left carotid cavernous fistula. This may be the cause of patient's symptoms. Discussions between neuro and interventional radiology resulted in a decision for cerebral angiogram on 11/07/2015. - tele, obs - Fistula treatment per IR/Neuro -TIA orders  HTN/CAD: - continue home metop, clonidine, losartan, ASA  COPD: - cniotinue combivent  Neuropaty/MSK/Knee pain: R knee OA. Chronic - Oxycodone, meloxicam, Zanaflex, neurontin - home regimen  Depression/Anxiety: - continue elavil and remeron, vistaril  Chronic diastolic congestive heart failure: Last echo showing EF [id number] grade 1 diastolic dysfunction. No acute decompensation. - Continue Lasix  Insomnia: - continue trazodone  GERD: - continue ppi   DVT prophylaxis: SCD  Code Status: full  Family Communication: daughter and multiple other family members  Disposition Plan: pending workup  Consults called: neuro, IR  Admission status: tele obs    Nyna Chilton,  Breylin Dom J MD [facility]  If 7PM-7AM, please contact night-coverage [email or web address] Password [email or web address]  11/06/2015, 5:30 PM

## 2015-11-06 NOTE — ED Triage Notes (Signed)
Patient arrived by [facility] with reported complaint of frontal headache. On assessment obvious slurred speech. Patient unable to answer questions about last known well, was seen by friend around 0900 and friend told patient her speech seemed different.  Unable to determine LKW

## 2015-11-06 NOTE — Congregational Nurse Program (Signed)
Congregational Nurse Program Note  Date of Encounter: 11/05/2015  Past Medical History: Past Medical History:  Diagnosis Date  . Anxiety   . Arthritis   . COPD (chronic obstructive pulmonary disease) ([place])   . Coronary artery disease   . Depression   . Diabetes mellitus without complication ([place])   . GERD (gastroesophageal reflux disease)   . Hypertension   . Neuromuscular disorder ([place])   . Osteoporosis     Encounter Details:     CNP Questionnaire - 11/06/15 1759      Patient Demographics   Is this a new or existing patient? Existing   Patient is considered a/an Not Applicable   Race African-American/Black     Patient Assistance   Location of Patient Assistance Not Applicable   Patient's financial/insurance status Medicaid   Uninsured Patient No   Patient referred to apply for the following financial assistance Not Applicable   Food insecurities addressed Not Applicable   Transportation assistance No   Assistance securing medications No   Educational health offerings Chronic disease;Medications     Encounter Details   Primary purpose of visit Education/Health Concerns;Chronic Illness/Condition Visit;Spiritual Care/Support Visit   Was an Emergency Department visit averted? Not Applicable   Does patient have a medical provider? Yes   Patient referred to Follow up with established PCP   Was a mental health screening completed? (GAINS tool) No   Does patient have dental issues? No   Was a dental referral made? No   Does patient have vision issues? No   Was a vision referral made? No   Does your patient have an abnormal blood pressure today? No   Since previous encounter, have you referred patient for abnormal blood pressure that resulted in a new diagnosis or medication change? No   Does your patient have an abnormal blood glucose today? No   Since previous encounter, have you referred patient for abnormal blood glucose that resulted in a new diagnosis or medication  change? No   Was there a life-saving intervention made? No     Client in to see nurse stating she got  Dizzy on bus  Last week and was  Seen in ED. States she was dehydrated ,given fluids. .Waiting non voucher for  Section 8 housing. States she  May need a cane to steady  herself.  She is out  Of her baby aspirin. States she is a t peace right now and okay. States she needs surgery on her knee but will wait  Until  She gets housing. .Feels better today  But  States last week had  Problems  Breathing,and has a history of  Asthma.Follow  Weekly

## 2015-11-06 NOTE — Progress Notes (Signed)
Pt arrived to 5C06 via stretcher.  Alert and oriented, in no apparent distress.  VSS.  Will continue to monitor.  Cori Razor, RN

## 2015-11-06 NOTE — Code Documentation (Signed)
55yo female arriving to [facility] via [place] at 1115.  Patient reporting headache that started at 0900 followed by slurred speech.  Code stroke activated.  Patient to CT.  Stroke team to the bedside. CT completed.  NIHSS 3, see documentation for details and code stroke times.  Patient with mild dysarthria, left leg drift and reports differences in sensation in her face reporting left face is "scratchy."  Of note, patient able to lift left leg off the bed and hold it up, but is reporting left calf pain.  Dr. Tasia Catchings at the bedside.  CTA ordered and completed.  Patient back to E40.  No acute stroke treatment at this time per MD.  Patient to have angiogram today or tomorrow per Dr. Tasia Catchings.  Bedside handoff with ED RN Elmyra Ricks.

## 2015-11-06 NOTE — Consult Note (Signed)
Chief Complaint: headache  Referring Physician:Dr. Elson Clan  Supervising Physician: Luanne Bras  Patient Status: In-pt  HPI: Tammy Strickland is an 55 y.o. female who began having the worse headache of her life this morning around 27am.  She states she had word finding trouble and thought she may have had some left sided weakness.  This has all improved since being at the hospital.  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  NIR has been consulted to evaluate this patient with a cerebral angiogram.  Past Medical History:  Past Medical History:  Diagnosis Date  . Anxiety   . Arthritis   . COPD (chronic obstructive pulmonary disease) ([place])   . Coronary artery disease   . Depression   . Diabetes mellitus without complication ([place])   . GERD (gastroesophageal reflux disease)   . Hypertension   . Neuromuscular disorder ([place])   . Osteoporosis     Past Surgical History:  Past Surgical History:  Procedure Laterality Date  . ABDOMINAL HYSTERECTOMY    . APPENDECTOMY    . CHOLECYSTECTOMY    . TONSILLECTOMY      Family History:  Family History  Problem Relation Age of Onset  . Hyperlipidemia Mother   . Hypertension Mother   . Hyperlipidemia Father   . Hypertension Father   . Stroke Father   . Hypertension Sister   . Cancer Sister     breast cancer  . Crohn's disease Sister     Social History:  reports that she has been smoking Cigarettes.  She has a 35.00 pack-year smoking history. She has never used smokeless tobacco. She reports that she does not drink alcohol or use drugs.  Allergies: No Known Allergies  Medications:   Medication List    ASK your doctor about these medications   amitriptyline 25 MG tablet Commonly known as:  ELAVIL Take 25 mg at night, increase to 50 mg at night after one week   aspirin EC 81 MG tablet Take 1 tablet (81 mg total) by mouth daily.   cloNIDine 0.3 MG  tablet Commonly known as:  CATAPRES Take 1 tablet (0.3 mg total) by mouth daily. Reported on 08/15/2015   fluticasone 50 MCG/ACT nasal spray Commonly known as:  FLONASE Place 1 spray into both nostrils daily as needed for allergies.   furosemide 80 MG tablet Commonly known as:  LASIX Take 1 tablet (80 mg total) by mouth daily.   gabapentin 800 MG tablet Commonly known as:  NEURONTIN Take 1 tablet (800 mg total) by mouth 3 (three) times daily.   hydrOXYzine 25 MG capsule Commonly known as:  VISTARIL Take 25 mg by mouth 3 (three) times daily. Reported on 08/15/2015   Ipratropium-Albuterol 20-100 MCG/ACT Aers respimat Commonly known as:  COMBIVENT Inhale 1 puff into the lungs every 6 (six) hours.   losartan 50 MG tablet Commonly known as:  COZAAR Take 1 tablet (50 mg total) by mouth 2 (two) times daily.   meloxicam 15 MG tablet Commonly known as:  MOBIC Take 1 tablet (15 mg total) by mouth daily.   metoprolol 200 MG 24 hr tablet Commonly known as:  TOPROL-XL Take 1 tablet (200 mg total) by mouth daily.   mirtazapine 30 MG tablet Commonly known as:  REMERON Take 30 mg by mouth at bedtime.   nitroGLYCERIN 0.4 mg/hr patch Commonly known as:  NITRODUR - Dosed in mg/24 hr Place 0.4 mg onto the  skin as needed.   nitroGLYCERIN 0.4 MG SL tablet Commonly known as:  NITROSTAT Place 0.4 mg under the tongue every 5 (five) minutes as needed for chest pain.   omeprazole 20 MG capsule Commonly known as:  PRILOSEC Take 1 capsule (20 mg total) by mouth daily.   potassium chloride SA 20 MEQ tablet Commonly known as:  K-DUR,KLOR-CON Take 1 tablet (20 mEq total) by mouth daily. Reported on 08/15/2015   tiZANidine 4 MG tablet Commonly known as:  ZANAFLEX Take 2.5 tablets (10 mg total) by mouth 2 (two) times daily as needed.   traZODone 50 MG tablet Commonly known as:  DESYREL Take 1 tablet (50 mg total) by mouth at bedtime.       Please HPI for pertinent positives, otherwise  complete 10 system ROS negative.  Mallampati Score: MD Evaluation Airway: WNL Heart: WNL Abdomen: WNL Chest/ Lungs: WNL ASA  Classification: 3 Mallampati/Airway Score: Two  Physical Exam: BP 111/69   Pulse (!) 59   Temp 98 F (36.7 C) (Oral)   Resp 16   Ht 5' 7.5" (1.715 m)   Wt (!) 306 lb (138.8 kg)   SpO2 95%   BMI 47.22 kg/m  Body mass index is 47.22 kg/m. General: pleasant, obese black female who is laying in bed in NAD HEENT: head is normocephalic, atraumatic.  Sclera are noninjected.  PERRL.  Ears and nose without any masses or lesions.  Mouth is pink and moist Heart: regular, rate, and rhythm.  Normal s1,s2. No obvious murmurs, gallops, or rubs noted.  Palpable radial and pedal pulses bilaterally Lungs: CTAB, no wheezes, rhonchi, or rales noted.  Respiratory effort nonlabored Abd: soft, NT, ND, +BS, no masses, hernias, or organomegaly MS: all 4 extremities are symmetrical with no cyanosis, clubbing, or edema. Psych: A&Ox3 with an appropriate affect.   Labs: Results for orders placed or performed during the hospital encounter of 11/06/15 (from the past 48 hour(s))  CBG monitoring, ED     Status: Abnormal   Collection Time: 11/06/15 11:17 AM  Result Value Ref Range   Glucose-Capillary 192 (H) 65 - 99 mg/dL  Protime-INR     Status: None   Collection Time: 11/06/15 11:22 AM  Result Value Ref Range   Prothrombin Time 13.7 11.4 - 15.2 seconds   INR 1.05   APTT     Status: None   Collection Time: 11/06/15 11:22 AM  Result Value Ref Range   aPTT 35 24 - 36 seconds  CBC     Status: None   Collection Time: 11/06/15 11:22 AM  Result Value Ref Range   WBC 8.1 4.0 - 10.5 K/uL   RBC 5.05 3.87 - 5.11 MIL/uL   Hemoglobin 14.0 12.0 - 15.0 g/dL   HCT 43.3 36.0 - 46.0 %   MCV 85.7 78.0 - 100.0 fL   MCH 27.7 26.0 - 34.0 pg   MCHC 32.3 30.0 - 36.0 g/dL   RDW 14.5 11.5 - 15.5 %   Platelets 296 150 - 400 K/uL  Differential     Status: None   Collection Time: 11/06/15 11:22  AM  Result Value Ref Range   Neutrophils Relative % 43 %   Neutro Abs 3.5 1.7 - 7.7 K/uL   Lymphocytes Relative 48 %   Lymphs Abs 3.9 0.7 - 4.0 K/uL   Monocytes Relative 5 %   Monocytes Absolute 0.4 0.1 - 1.0 K/uL   Eosinophils Relative 4 %   Eosinophils Absolute 0.3 0.0 - 0.7 K/uL  Basophils Relative 0 %   Basophils Absolute 0.0 0.0 - 0.1 K/uL  Comprehensive metabolic panel     Status: Abnormal   Collection Time: 11/06/15 11:22 AM  Result Value Ref Range   Sodium 139 135 - 145 mmol/L   Potassium 4.4 3.5 - 5.1 mmol/L   Chloride 106 101 - 111 mmol/L   CO2 27 22 - 32 mmol/L   Glucose, Bld 187 (H) 65 - 99 mg/dL   BUN 10 6 - 20 mg/dL   Creatinine, Ser 0.82 0.44 - 1.00 mg/dL   Calcium 9.0 8.9 - 10.3 mg/dL   Total Protein 6.9 6.5 - 8.1 g/dL   Albumin 3.3 (L) 3.5 - 5.0 g/dL   AST 27 15 - 41 U/L   ALT 27 14 - 54 U/L   Alkaline Phosphatase 110 38 - 126 U/L   Total Bilirubin 0.2 (L) 0.3 - 1.2 mg/dL   GFR calc non Af Amer >60 >60 mL/min   GFR calc Af Amer >60 >60 mL/min    Comment: (NOTE) The eGFR has been calculated using the CKD EPI equation. This calculation has not been validated in all clinical situations. eGFR's persistently <60 mL/min signify possible Chronic Kidney Disease.    Anion gap 6 5 - 15  I-stat troponin, ED     Status: None   Collection Time: 11/06/15 11:36 AM  Result Value Ref Range   Troponin i, poc 0.00 0.00 - 0.08 ng/mL   Comment 3            Comment: Due to the release kinetics of cTnI, a negative result within the first hours of the onset of symptoms does not rule out myocardial infarction with certainty. If myocardial infarction is still suspected, repeat the test at appropriate intervals.   I-Stat Chem 8, ED     Status: Abnormal   Collection Time: 11/06/15 11:37 AM  Result Value Ref Range   Sodium 141 135 - 145 mmol/L   Potassium 4.1 3.5 - 5.1 mmol/L   Chloride 101 101 - 111 mmol/L   BUN 11 6 - 20 mg/dL   Creatinine, Ser 0.70 0.44 - 1.00 mg/dL    Glucose, Bld 189 (H) 65 - 99 mg/dL   Calcium, Ion 1.16 1.13 - 1.30 mmol/L   TCO2 28 0 - 100 mmol/L   Hemoglobin 14.6 12.0 - 15.0 g/dL   HCT 43.0 36.0 - 46.0 %  CBG monitoring, ED     Status: Abnormal   Collection Time: 11/06/15 12:09 PM  Result Value Ref Range   Glucose-Capillary 147 (H) 65 - 99 mg/dL    Imaging: Ct Angio Head W Or Wo Contrast  Result Date: 11/06/2015 CLINICAL DATA:  Sudden onset of headache and slurred speech. Code stroke. Abnormal CT head. History diabetes and hypertension. EXAM: CT ANGIOGRAPHY HEAD AND NECK TECHNIQUE: Multidetector CT imaging of the head and neck was performed using the standard protocol during bolus administration of intravenous contrast. Multiplanar CT image reconstructions and MIPs were obtained to evaluate the vascular anatomy. Carotid stenosis measurements (when applicable) are obtained utilizing NASCET criteria, using the distal internal carotid diameter as the denominator. CONTRAST:  50 mL Isovue 370 IV COMPARISON:  CT head 11/06/2015 FINDINGS: CTA NECK Aortic arch: Normal aortic arch. Proximal great vessels widely patent without stenosis. Lung apices clear. Right carotid system: Right carotid artery is widely patent without stenosis. No atherosclerotic disease or dissection. Vascular tortuosity noted. Left carotid system: Left carotid artery widely patent without stenosis. No dissection or atherosclerotic  disease. Vascular tortuosity noted. Vertebral arteries:Both vertebral arteries are patent to the basilar without stenosis or dissection. Skeleton: No acute skeletal abnormality. Minimal degenerative change in the cervical spine. Other neck: Negative for mass or adenopathy in the neck. CTA HEAD Anterior circulation: Left cavernous carotid widely patent. There is early filling of the left cavernous sinus which is asymmetric. No significant contrast in the right cavernous sinus. In addition, the superior ophthalmic vein has dense enhancement and is  distended. Dense contrast also in the veins of the superior orbit and left face. Findings compatible with cavernous carotid fistula. No significant atherosclerotic disease in the cavernous carotid. No definite aneurysm identified. Left cavernous carotid widely patent. Right anterior and middle cerebral arteries widely patent. Left anterior middle cerebral arteries widely patent without stenosis. Posterior circulation: Both vertebral arteries patent to the basilar. PICA, superior cerebellar, posterior cerebral arteries patent. Fetal origin left posterior cerebral artery with hypoplastic left P1 segment. 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 This may be a spontaneous fistula. No underlying atherosclerotic disease or aneurysm identified. Catheter angiogram is suggested. No significant carotid or vertebral artery stenosis in the neck. I reviewed the images with Dr. Tasia Catchings at approximately 1210 hours on 11/06/2015. Electronically Signed   By: Franchot Gallo M.D.   On: 11/06/2015 12:40  Ct Angio Neck W Or Wo Contrast  Result Date: 11/06/2015 CLINICAL DATA:  Sudden onset of headache and slurred speech. Code stroke. Abnormal CT head. History diabetes and hypertension. EXAM: CT ANGIOGRAPHY HEAD AND NECK TECHNIQUE: Multidetector CT imaging of the head and neck was performed using the standard protocol during bolus administration of intravenous contrast. Multiplanar CT image reconstructions and MIPs were obtained to evaluate the vascular anatomy. Carotid stenosis measurements (when applicable) are obtained utilizing NASCET criteria, using the distal internal carotid diameter as the denominator. CONTRAST:  50 mL Isovue 370 IV COMPARISON:  CT head 11/06/2015 FINDINGS: CTA NECK Aortic arch: Normal aortic arch. Proximal great vessels widely patent without stenosis. Lung apices clear. Right carotid system: Right  carotid artery is widely patent without stenosis. No atherosclerotic disease or dissection. Vascular tortuosity noted. Left carotid system: Left carotid artery widely patent without stenosis. No dissection or atherosclerotic disease. Vascular tortuosity noted. Vertebral arteries:Both vertebral arteries are patent to the basilar without stenosis or dissection. Skeleton: No acute skeletal abnormality. Minimal degenerative change in the cervical spine. Other neck: Negative for mass or adenopathy in the neck. CTA HEAD Anterior circulation: Left cavernous carotid widely patent. There is early filling of the left cavernous sinus which is asymmetric. No significant contrast in the right cavernous sinus. In addition, the superior ophthalmic vein has dense enhancement and is distended. Dense contrast also in the veins of the superior orbit and left face. Findings compatible with cavernous carotid fistula. No significant atherosclerotic disease in the cavernous carotid. No definite aneurysm identified. Left cavernous carotid widely patent. Right anterior and middle cerebral arteries widely patent. Left anterior middle cerebral arteries widely patent without stenosis. Posterior circulation: Both vertebral arteries patent to the basilar. PICA, superior cerebellar, posterior cerebral arteries patent. Fetal origin left posterior cerebral artery with hypoplastic left P1 segment. 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 This may be a spontaneous fistula. No underlying atherosclerotic disease or aneurysm identified. Catheter angiogram is suggested. No significant carotid or vertebral artery stenosis in the neck. I reviewed the images  with Dr. Tasia Catchings at approximately 1210 hours on 11/06/2015. Electronically Signed   By: Franchot Gallo M.D.   On: 11/06/2015 12:40  Ct Head Code Stroke Wo Contrast`  Addendum Date:  11/06/2015   ADDENDUM REPORT: 11/06/2015 11:57 ADDENDUM: Critical Value/emergent results were called by telephone at the time of interpretation on 11/06/2015 at 11:56 am to Dr. Elson Clan, who verbally acknowledged these results. Electronically Signed   By: Lowella Grip III M.D.   On: 11/06/2015 11:57  Result Date: 11/06/2015 CLINICAL DATA:  Acute onset headache with slurred speech and sensory deficits EXAM: CT HEAD WITHOUT CONTRAST TECHNIQUE: Contiguous axial images were obtained from the base of the skull through the vertex without intravenous contrast. COMPARISON:  None. FINDINGS: The ventricles are normal in size and configuration. There is no intracranial mass, hemorrhage, extra-axial fluid collection, or midline shift. Gray-white compartments appear normal. No acute infarct is evident currently. There is a focal area of hyperdensity in the distal most aspect of the left internal carotid artery at the junction with the origin of the left middle cerebral artery. No other hyperdensity seen. Vascular calcification is evident. The bony calvarium appears intact. Mastoid air cells are clear. Visualized paranasal sinuses are normal. Orbits appear symmetric bilaterally. A metallic foreign body is noted adjacent to the right orbit laterally, a cosmetic ring. IMPRESSION: Focal hyperdensity in the distal most aspect of the left internal carotid artery extending into the origin of the left middle cerebral artery. This finding is best appreciated on axial slices 11 and 12, series 2. This finding raises concern for localized vessel thrombus with earliest changes of infarct on the left. No gray-white compartment lesions are currently seen. No hemorrhage. Study otherwise unremarkable. Electronically Signed: By: Lowella Grip III M.D. On: 11/06/2015 11:54   Assessment/Plan 1. Carotid cavernous fistula -we will make the patient NPO p MN in preparation for a cerebral angio tomorrow. -her labs and vitals have  all been reviewed -Risks and Benefits discussed with the patient including, but not limited to bleeding, infection, vascular injury or contrast induced renal failure. All of the patient's questions were answered, patient is agreeable to proceed. Consent signed and in chart.  Thank you for this interesting consult.  I greatly enjoyed meeting Tammy Strickland and look forward to participating in their care.  A copy of this report was sent to the requesting provider on this date.  Electronically Signed: Henreitta Cea 11/06/2015, 3:16 PM   I spent a total of 40 Minutes    in face to face in clinical consultation, greater than 50% of which was counseling/coordinating care for carotid cavernous fistula

## 2015-11-06 NOTE — ED Notes (Signed)
Placed pt on bedpan, tolerated well. 

## 2015-11-06 NOTE — ED Provider Notes (Signed)
[facility] DEPT Provider Note   CSN: [id number] Arrival date & time: 11/06/15  1115  First Provider Contact:  First MD Initiated Contact with Patient 11/06/15 1133        History   Chief Complaint Chief Complaint  Patient presents with  . Aphasia    HPI Tammy Strickland is a 55 y.o. female.  The history is provided by the patient and a significant other.   55 year old female with history of anxiety, arthritis, COPD, coronary artery disease, depression, diabetes, GERD, neuromuscular disorder, history of TIAs, presenting to the ED with headache, slurred speech, and decreased sensation of her left arm.  She reports she woke up at 5 AM this morning and was meeting with her caseworker around 9am when she developed sudden onset of headache that was severe. States shortly after she developed some slurred speech and a numb sensation in her left arm. States she does not have any history of migraine headaches. She does report TIAs, states her most recent was in February 2017.  She does currently take aspirin, no other anticoagulation. She denies any focal weakness of her arms or legs.  Past Medical History:  Diagnosis Date  . Anxiety   . Arthritis   . COPD (chronic obstructive pulmonary disease) ([place])   . Coronary artery disease   . Depression   . Diabetes mellitus without complication ([place])   . GERD (gastroesophageal reflux disease)   . Hypertension   . Neuromuscular disorder ([place])   . Osteoporosis     Patient Active Problem List   Diagnosis Date Noted  . Chest pain 10/31/2015  . Insomnia 10/10/2015  . Prediabetes 10/10/2015  . Depression 10/10/2015  . Benign essential HTN 08/15/2015  . Fibromyalgia 08/15/2015  . Peripheral neuropathy ([place]) 08/15/2015  . Arthritis 08/15/2015  . CAD (coronary artery disease) 08/15/2015  . COPD (chronic obstructive pulmonary disease) ([place]) 08/15/2015  . History of diabetes mellitus 08/15/2015  . Osteoporosis 08/15/2015  . Left knee DJD  08/15/2015  . DDD (degenerative disc disease), lumbar 08/15/2015  . Occupational exposure to industrial toxins 08/15/2015  . GERD (gastroesophageal reflux disease) 08/15/2015    Past Surgical History:  Procedure Laterality Date  . ABDOMINAL HYSTERECTOMY    . APPENDECTOMY    . CHOLECYSTECTOMY    . TONSILLECTOMY      OB History    No data available       Home Medications    Prior to Admission medications   Medication Sig Start Date End Date Taking? Authorizing Provider  amitriptyline (ELAVIL) 25 MG tablet Take 25 mg at night, increase to 50 mg at night after one week Patient taking differently: Take 50 mg by mouth at bedtime. Take 25 mg at night, increase to 50 mg at night after one week 10/10/15   Binnie Rail, MD  aspirin EC 81 MG tablet Take 1 tablet (81 mg total) by mouth daily. 11/01/15   Silver Huguenin Elgergawy, MD  cloNIDine (CATAPRES) 0.3 MG tablet Take 1 tablet (0.3 mg total) by mouth daily. Reported on 08/15/2015 10/10/15   Binnie Rail, MD  fluticasone (FLONASE) 50 MCG/ACT nasal spray Place 1 spray into both nostrils daily as needed for allergies.     Historical Provider, MD  furosemide (LASIX) 80 MG tablet Take 1 tablet (80 mg total) by mouth daily. 10/10/15   Binnie Rail, MD  gabapentin (NEURONTIN) 800 MG tablet Take 1 tablet (800 mg total) by mouth 3 (three) times daily. 08/29/15   Harrison Mons, PA-C  hydrOXYzine (VISTARIL) 25 MG capsule Take 25 mg by mouth 3 (three) times daily. Reported on 08/15/2015    Historical Provider, MD  Ipratropium-Albuterol (COMBIVENT) 20-100 MCG/ACT AERS respimat Inhale 1 puff into the lungs every 6 (six) hours. Patient taking differently: Inhale 1 puff into the lungs every 6 (six) hours as needed.  10/10/15   Binnie Rail, MD  losartan (COZAAR) 50 MG tablet Take 1 tablet (50 mg total) by mouth 2 (two) times daily. 08/29/15   Chelle Jeffery, PA-C  meloxicam (MOBIC) 15 MG tablet Take 1 tablet (15 mg total) by mouth daily. 08/29/15   Chelle Jeffery,  PA-C  metoprolol (TOPROL-XL) 200 MG 24 hr tablet Take 1 tablet (200 mg total) by mouth daily. 10/10/15   Binnie Rail, MD  mirtazapine (REMERON) 30 MG tablet Take 30 mg by mouth at bedtime. 09/02/15   Historical Provider, MD  nitroGLYCERIN (NITRODUR - DOSED IN MG/24 HR) 0.4 mg/hr patch Place 0.4 mg onto the skin as needed.    Historical Provider, MD  omeprazole (PRILOSEC) 20 MG capsule Take 1 capsule (20 mg total) by mouth daily. 08/29/15   Chelle Jeffery, PA-C  potassium chloride SA (K-DUR,KLOR-CON) 20 MEQ tablet Take 1 tablet (20 mEq total) by mouth daily. Reported on 08/15/2015 10/10/15   Binnie Rail, MD  tiZANidine (ZANAFLEX) 4 MG tablet Take 2.5 tablets (10 mg total) by mouth 2 (two) times daily as needed. Patient taking differently: Take 8 mg by mouth 3 (three) times daily.  08/29/15   Chelle Jeffery, PA-C  traZODone (DESYREL) 50 MG tablet Take 1 tablet (50 mg total) by mouth at bedtime. 08/29/15   Harrison Mons, PA-C    Family History Family History  Problem Relation Age of Onset  . Hyperlipidemia Mother   . Hypertension Mother   . Hyperlipidemia Father   . Hypertension Father   . Stroke Father   . Hypertension Sister   . Cancer Sister     breast cancer  . Crohn's disease Sister     Social History Social History  Substance Use Topics  . Smoking status: Current Every Day Smoker    Packs/day: 1.00    Years: 35.00    Types: Cigarettes  . Smokeless tobacco: Never Used     Comment: referred  to smoking  cessation  classes. at  [facility]   . Alcohol use No     Allergies   Review of patient's allergies indicates no known allergies.   Review of Systems Review of Systems  Neurological: Positive for speech difficulty and headaches.  All other systems reviewed and are negative.    Physical Exam Updated Vital Signs BP 105/71 (BP Location: Left Arm)   Pulse 66   Temp 97.7 F (36.5 C) (Oral)   Resp 18   SpO2 96%   Physical Exam  Constitutional: She is oriented to person,  place, and time. She appears well-developed and well-nourished.  HENT:  Head: Normocephalic and atraumatic.  Mouth/Throat: Oropharynx is clear and moist.  Eyes: Conjunctivae and EOM are normal. Pupils are equal, round, and reactive to light.  Neck: Normal range of motion.  Cardiovascular: Normal rate, regular rhythm and normal heart sounds.   Pulmonary/Chest: Effort normal and breath sounds normal.  Abdominal: Soft. Bowel sounds are normal.  Musculoskeletal: Normal range of motion.  Neurological: She is alert and oriented to person, place, and time.  AAOx3, answering questions and following commands appropriately; speech is slurred; equal strength UE and LE bilaterally; moving extremities without apparent  ataxia; reports decreased sensation of left arm compared with right (states feels like wood); normal sensation of face and legs bilaterally; gait not tested  Skin: Skin is warm and dry.  Psychiatric: She has a normal mood and affect.  Nursing note and vitals reviewed.    ED Treatments / Results  Labs (all labs ordered are listed, but only abnormal results are displayed) Labs Reviewed  COMPREHENSIVE METABOLIC PANEL - Abnormal; Notable for the following:       Result Value   Glucose, Bld 187 (*)    Albumin 3.3 (*)    Total Bilirubin 0.2 (*)    All other components within normal limits  CBG MONITORING, ED - Abnormal; Notable for the following:    Glucose-Capillary 192 (*)    All other components within normal limits  CBG MONITORING, ED - Abnormal; Notable for the following:    Glucose-Capillary 147 (*)    All other components within normal limits  I-STAT CHEM 8, ED - Abnormal; Notable for the following:    Glucose, Bld 189 (*)    All other components within normal limits  PROTIME-INR  APTT  CBC  DIFFERENTIAL  I-STAT TROPOININ, ED    EKG  EKG Interpretation None       Radiology Ct Angio Head W Or Wo Contrast  Result Date: 11/06/2015 CLINICAL DATA:  Sudden onset of  headache and slurred speech. Code stroke. Abnormal CT head. History diabetes and hypertension. EXAM: CT ANGIOGRAPHY HEAD AND NECK TECHNIQUE: Multidetector CT imaging of the head and neck was performed using the standard protocol during bolus administration of intravenous contrast. Multiplanar CT image reconstructions and MIPs were obtained to evaluate the vascular anatomy. Carotid stenosis measurements (when applicable) are obtained utilizing NASCET criteria, using the distal internal carotid diameter as the denominator. CONTRAST:  50 mL Isovue 370 IV COMPARISON:  CT head 11/06/2015 FINDINGS: CTA NECK Aortic arch: Normal aortic arch. Proximal great vessels widely patent without stenosis. Lung apices clear. Right carotid system: Right carotid artery is widely patent without stenosis. No atherosclerotic disease or dissection. Vascular tortuosity noted. Left carotid system: Left carotid artery widely patent without stenosis. No dissection or atherosclerotic disease. Vascular tortuosity noted. Vertebral arteries:Both vertebral arteries are patent to the basilar without stenosis or dissection. Skeleton: No acute skeletal abnormality. Minimal degenerative change in the cervical spine. Other neck: Negative for mass or adenopathy in the neck. CTA HEAD Anterior circulation: Left cavernous carotid widely patent. There is early filling of the left cavernous sinus which is asymmetric. No significant contrast in the right cavernous sinus. In addition, the superior ophthalmic vein has dense enhancement and is distended. Dense contrast also in the veins of the superior orbit and left face. Findings compatible with cavernous carotid fistula. No significant atherosclerotic disease in the cavernous carotid. No definite aneurysm identified. Left cavernous carotid widely patent. Right anterior and middle cerebral arteries widely patent. Left anterior middle cerebral arteries widely patent without stenosis. Posterior circulation: Both  vertebral arteries patent to the basilar. PICA, superior cerebellar, posterior cerebral arteries patent. Fetal origin left posterior cerebral artery with hypoplastic left P1 segment. 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 This may be a spontaneous fistula. No underlying atherosclerotic disease or aneurysm identified. Catheter angiogram is suggested. No significant carotid or vertebral artery stenosis in the neck. I reviewed the images with Dr. Tasia Catchings at approximately 1210 hours on 11/06/2015. Electronically Signed   By: Franchot Gallo  M.D.   On: 11/06/2015 12:40  Ct Angio Neck W Or Wo Contrast  Result Date: 11/06/2015 CLINICAL DATA:  Sudden onset of headache and slurred speech. Code stroke. Abnormal CT head. History diabetes and hypertension. EXAM: CT ANGIOGRAPHY HEAD AND NECK TECHNIQUE: Multidetector CT imaging of the head and neck was performed using the standard protocol during bolus administration of intravenous contrast. Multiplanar CT image reconstructions and MIPs were obtained to evaluate the vascular anatomy. Carotid stenosis measurements (when applicable) are obtained utilizing NASCET criteria, using the distal internal carotid diameter as the denominator. CONTRAST:  50 mL Isovue 370 IV COMPARISON:  CT head 11/06/2015 FINDINGS: CTA NECK Aortic arch: Normal aortic arch. Proximal great vessels widely patent without stenosis. Lung apices clear. Right carotid system: Right carotid artery is widely patent without stenosis. No atherosclerotic disease or dissection. Vascular tortuosity noted. Left carotid system: Left carotid artery widely patent without stenosis. No dissection or atherosclerotic disease. Vascular tortuosity noted. Vertebral arteries:Both vertebral arteries are patent to the basilar without stenosis or dissection. Skeleton: No acute skeletal abnormality. Minimal degenerative  change in the cervical spine. Other neck: Negative for mass or adenopathy in the neck. CTA HEAD Anterior circulation: Left cavernous carotid widely patent. There is early filling of the left cavernous sinus which is asymmetric. No significant contrast in the right cavernous sinus. In addition, the superior ophthalmic vein has dense enhancement and is distended. Dense contrast also in the veins of the superior orbit and left face. Findings compatible with cavernous carotid fistula. No significant atherosclerotic disease in the cavernous carotid. No definite aneurysm identified. Left cavernous carotid widely patent. Right anterior and middle cerebral arteries widely patent. Left anterior middle cerebral arteries widely patent without stenosis. Posterior circulation: Both vertebral arteries patent to the basilar. PICA, superior cerebellar, posterior cerebral arteries patent. Fetal origin left posterior cerebral artery with hypoplastic left P1 segment. 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 This may be a spontaneous fistula. No underlying atherosclerotic disease or aneurysm identified. Catheter angiogram is suggested. No significant carotid or vertebral artery stenosis in the neck. I reviewed the images with Dr. Tasia Catchings at approximately 1210 hours on 11/06/2015. Electronically Signed   By: Franchot Gallo M.D.   On: 11/06/2015 12:40  Ct Head Code Stroke Wo Contrast`  Addendum Date: 11/06/2015   ADDENDUM REPORT: 11/06/2015 11:57 ADDENDUM: Critical Value/emergent results were called by telephone at the time of interpretation on 11/06/2015 at 11:56 am to Dr. Elson Clan, who verbally acknowledged these results. Electronically Signed   By: Lowella Grip III M.D.   On: 11/06/2015 11:57  Result Date: 11/06/2015 CLINICAL DATA:  Acute onset headache with slurred speech and sensory deficits EXAM: CT HEAD  WITHOUT CONTRAST TECHNIQUE: Contiguous axial images were obtained from the base of the skull through the vertex without intravenous contrast. COMPARISON:  None. FINDINGS: The ventricles are normal in size and configuration. There is no intracranial mass, hemorrhage, extra-axial fluid collection, or midline shift. Gray-white compartments appear normal. No acute infarct is evident currently. There is a focal area of hyperdensity in the distal most aspect of the left internal carotid artery at the junction with the origin of the left middle cerebral artery. No other hyperdensity seen. Vascular calcification is evident. The bony calvarium appears intact. Mastoid air cells are clear. Visualized paranasal sinuses are normal. Orbits appear symmetric bilaterally. A metallic foreign body is noted adjacent to the right orbit laterally, a cosmetic ring. IMPRESSION:  Focal hyperdensity in the distal most aspect of the left internal carotid artery extending into the origin of the left middle cerebral artery. This finding is best appreciated on axial slices 11 and 12, series 2. This finding raises concern for localized vessel thrombus with earliest changes of infarct on the left. No gray-white compartment lesions are currently seen. No hemorrhage. Study otherwise unremarkable. Electronically Signed: By: Lowella Grip III M.D. On: 11/06/2015 11:54   Procedures Procedures (including critical care time)  Medications Ordered in ED Medications - No data to display   Initial Impression / Assessment and Plan / ED Course  I have reviewed the triage vital signs and the nursing notes.  Pertinent labs & imaging results that were available during my care of the patient were reviewed by me and considered in my medical decision making (see chart for details).  Clinical Course   55 y.o. F here with headache, slurred, speech and decreased sensation of her left arm.  LKW at 9am.  Reports hx of TIA's.  No hx of migraines.  Code  stroke was activated upon initial assessment.  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  Neurology, Dr. Tasia Catchings has reviewed this with stroke team as well as interventional neuro radiology-- will plan for angiogram tomorrow morning as patient is stable at this time.  She will also undergo MRI/MRA.  Patient will be admitted for further management.  Neurology to follow along.  Final Clinical Impressions(s) / ED Diagnoses   Final diagnoses:  Slurred speech    New Prescriptions New Prescriptions   No medications on file     Kathryne Hitch 11/06/15 [place], MD 11/06/15 [phone]

## 2015-11-06 NOTE — ED Notes (Signed)
Claiborne Billings, [id number] [phone] Page when pt returns from MRI.  IR will be ready for pt in approx. 90 min.

## 2015-11-06 NOTE — H&P (Cosign Needed)
History and Physical  Entered in error   Tammy Strickland [id number] DOB: 12-21-1960 DOA: 11/06/2015   PCP: Binnie Rail, MD   Patient coming from:  Home    Chief Complaint:   HPI: Tammy Strickland is a 55 y.o. female with medical history significant for  Anxiety,arthritis, COPD, CAD, depression, DM, GERD, hypertension, neuromuscular disorder,presenting with severe headaches in a patient with no prior history of migraine. The headache is frontal, without any other neurological complaints, or evidence of facial asymmetry. She denies any blurred vision or double vision. She was taken urgently to the CT scan, which revealed possible dense left MCA sign . CT on June was requested, which findings are suggestive of left carotid cavernous fistula. Stroke team was called, who recommended cerebral angiogram, which is planned for tomorrow.  Neuro to continue to consult. We were requested to admit the patient for the management of her chronic medical issues.    ED Course:  BP 127/90 (BP Location: Left Arm)   Pulse 61   Temp 97.7 F (36.5 C) (Oral)   Resp 16   Ht 5' 7.5" (1.715 m)   Wt (!) 138.8 kg (306 lb)   SpO2 100%   BMI 47.22 kg/m    CBC and CMET  normal. Glucose 189. MRI and MR A of the head shows no evidence of acute stroke or mass. 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 Today's 50-75% stenosis in the right vertebral. Review of Systems: As per HPI otherwise 10 point review of systems negative.   Past Medical History:  Diagnosis Date  . Anxiety   . Arthritis   . COPD (chronic obstructive pulmonary disease) ([place])   . Coronary artery disease   . Depression   . Diabetes mellitus without complication ([place])   . GERD (gastroesophageal reflux disease)   . Hypertension   . Neuromuscular disorder ([place])   . Osteoporosis     Past Surgical History:  Procedure Laterality Date  . ABDOMINAL HYSTERECTOMY    . APPENDECTOMY     . CHOLECYSTECTOMY    . TONSILLECTOMY      Social History Social History   Social History  . Marital status: Single    Spouse name: n/a  . Number of children: 3  . Years of education: 12+   Occupational History  . 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 Smoking status: Current Every Day Smoker    Packs/day: 1.00    Years: 35.00    Types: Cigarettes  . Smokeless tobacco: Never Used     Comment: referred  to smoking  cessation  classes. at  [facility]   . Alcohol use No  . 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   Marland Kitchen Sexual activity: Yes    Partners: Female   Other Topics Concern  . Not on file   Social History Narrative   Moved to [place] from [place], [place] February 2017, to help her daughter.   Lives with her daughter.   Sons live in [place] and [place].   She reports that there were originally 17 children in her family (she is the youngest), the oldest are deceased, some prior to her birth, and she isn't sure which were female/female or how they died.     No Known Allergies  Family History  Problem Relation Age of  Onset  . Hyperlipidemia Mother   . Hypertension Mother   . Hyperlipidemia Father   . Hypertension Father   . Stroke Father   . Hypertension Sister   . Cancer Sister     breast cancer  . Crohn's disease Sister       Prior to Admission medications   Medication Sig Start Date End Date Taking? Authorizing Provider  amitriptyline (ELAVIL) 25 MG tablet Take 25 mg at night, increase to 50 mg at night after one week Patient taking differently: Take 50 mg by mouth at bedtime. Take 25 mg at night, increase to 50 mg at night after one week 10/10/15  Yes Binnie Rail, MD  aspirin EC 81 MG tablet Take 1 tablet (81 mg total) by mouth daily. 11/01/15  Yes Albertine Patricia, MD  cloNIDine (CATAPRES) 0.3 MG tablet Take 1 tablet (0.3 mg total) by mouth daily. Reported on  08/15/2015 10/10/15  Yes Binnie Rail, MD  fluticasone (FLONASE) 50 MCG/ACT nasal spray Place 1 spray into both nostrils daily as needed for allergies.    Yes Historical Provider, MD  furosemide (LASIX) 80 MG tablet Take 1 tablet (80 mg total) by mouth daily. 10/10/15  Yes Binnie Rail, MD  gabapentin (NEURONTIN) 800 MG tablet Take 1 tablet (800 mg total) by mouth 3 (three) times daily. 08/29/15  Yes Chelle Jeffery, PA-C  hydrOXYzine (VISTARIL) 25 MG capsule Take 25 mg by mouth 3 (three) times daily. Reported on 08/15/2015   Yes Historical Provider, MD  Ipratropium-Albuterol (COMBIVENT) 20-100 MCG/ACT AERS respimat Inhale 1 puff into the lungs every 6 (six) hours. Patient taking differently: Inhale 1 puff into the lungs every 6 (six) hours as needed.  10/10/15  Yes Binnie Rail, MD  losartan (COZAAR) 50 MG tablet Take 1 tablet (50 mg total) by mouth 2 (two) times daily. 08/29/15  Yes Chelle Jeffery, PA-C  meloxicam (MOBIC) 15 MG tablet Take 1 tablet (15 mg total) by mouth daily. 08/29/15  Yes Chelle Jeffery, PA-C  metoprolol (TOPROL-XL) 200 MG 24 hr tablet Take 1 tablet (200 mg total) by mouth daily. 10/10/15  Yes Binnie Rail, MD  mirtazapine (REMERON) 30 MG tablet Take 30 mg by mouth at bedtime. 09/02/15  Yes Historical Provider, MD  nitroGLYCERIN (NITROSTAT) 0.4 MG SL tablet Place 0.4 mg under the tongue every 5 (five) minutes as needed for chest pain.   Yes Historical Provider, MD  omeprazole (PRILOSEC) 20 MG capsule Take 1 capsule (20 mg total) by mouth daily. 08/29/15  Yes Chelle Jeffery, PA-C  potassium chloride SA (K-DUR,KLOR-CON) 20 MEQ tablet Take 1 tablet (20 mEq total) by mouth daily. Reported on 08/15/2015 10/10/15  Yes Binnie Rail, MD  tiZANidine (ZANAFLEX) 4 MG tablet Take 2.5 tablets (10 mg total) by mouth 2 (two) times daily as needed. Patient taking differently: Take 8 mg by mouth 3 (three) times daily.  08/29/15  Yes Chelle Jeffery, PA-C  traZODone (DESYREL) 50 MG tablet Take 1 tablet (50 mg  total) by mouth at bedtime. 08/29/15  Yes Chelle Jeffery, PA-C  nitroGLYCERIN (NITRODUR - DOSED IN MG/24 HR) 0.4 mg/hr patch Place 0.4 mg onto the skin as needed.    Historical Provider, MD    Physical Exam:    Vitals:   11/06/15 1215 11/06/15 1230 11/06/15 1445 11/06/15 1527  BP: 97/64 99/60 111/69 127/90  Pulse: 62 64 (!) 59 61  Resp: 15 18 16 16   Temp:    97.7 F (36.5 C)  TempSrc:    Oral  SpO2: 98% 97% 95% 100%  Weight:      Height:           Constitutional: NAD, calm, comfortable   Vitals:   11/06/15 1215 11/06/15 1230 11/06/15 1445 11/06/15 1527  BP: 97/64 99/60 111/69 127/90  Pulse: 62 64 (!) 59 61  Resp: 15 18 16 16   Temp:    97.7 F (36.5 C)  TempSrc:    Oral  SpO2: 98% 97% 95% 100%  Weight:      Height:       Eyes: PERRL, lids and conjunctivae normal ENMT: Mucous membranes are moist. Posterior pharynx clear of any exudate or lesions.Normal dentition.  Neck: normal, supple, no masses, no thyromegaly Respiratory: clear to auscultation bilaterally, no wheezing, no crackles. Normal respiratory effort. No accessory muscle use.  Cardiovascular: Regular rate and rhythm, no murmurs / rubs / gallops. No extremity edema. 2+ pedal pulses. No carotid bruits.  Abdomen: no tenderness, no masses palpated. No hepatosplenomegaly. Bowel sounds positive.  Musculoskeletal: no clubbing / cyanosis. No joint deformity upper and lower extremities. Good ROM, no contractures. Normal muscle tone.  Skin: no rashes, lesions, ulcers.  Neurologic: CN 2-12 grossly intact. Sensation intact, DTR normal. Strength 5/5 in all 4.  Psychiatric: Normal judgment and insight. Alert and oriented x 3. Normal mood.     Labs on Admission: I have personally reviewed following labs and imaging studies  CBC:  Recent Labs Lab 10/31/15 1600 11/01/15 0156 11/06/15 1122 11/06/15 1137  WBC 11.9* 13.4* 8.1  --   NEUTROABS 6.6  --  3.5  --   HGB 15.2* 14.7 14.0 14.6  HCT 45.3 44.5 43.3 43.0  MCV  83.1 84.8 85.7  --   PLT PLATELET CLUMPS NOTED ON SMEAR, COUNT APPEARS ADEQUATE 247 296  --     Basic Metabolic Panel:  Recent Labs Lab 10/31/15 1742 11/06/15 1122 11/06/15 1137  NA 137 139 141  K 4.0 4.4 4.1  CL 104 106 101  CO2 25 27  --   GLUCOSE 142* 187* 189*  BUN 15 10 11   CREATININE 0.92 0.82 0.70  CALCIUM 8.9 9.0  --     GFR: Estimated Creatinine Clearance: 119.7 mL/min (by C-G formula based on SCr of 0.8 mg/dL).  Liver Function Tests:  Recent Labs Lab 10/31/15 1742 11/06/15 1122  AST 34 27  ALT 29 27  ALKPHOS 97 110  BILITOT 0.3 0.2*  PROT 6.0* 6.9  ALBUMIN 3.3* 3.3*    Recent Labs Lab 10/31/15 1742  LIPASE 18   No results for input(s): AMMONIA in the last 168 hours.  Coagulation Profile:  Recent Labs Lab 11/06/15 1122  INR 1.05    Cardiac Enzymes:  Recent Labs Lab 10/31/15 2044 11/01/15 0156  TROPONINI <0.03 <0.03    BNP (last 3 results) No results for input(s): PROBNP in the last 8760 hours.  HbA1C: No results for input(s): HGBA1C in the last 72 hours.  CBG:  Recent Labs Lab 11/06/15 1117 11/06/15 1209  GLUCAP 192* 147*    Lipid Profile: No results for input(s): CHOL, HDL, LDLCALC, TRIG, CHOLHDL, LDLDIRECT in the last 72 hours.  Thyroid Function Tests: No results for input(s): TSH, T4TOTAL, FREET4, T3FREE, THYROIDAB in the last 72 hours.  Anemia Panel: No results for input(s): VITAMINB12, FOLATE, FERRITIN, TIBC, IRON, RETICCTPCT in the last 72 hours.  Urine analysis: No results found for: COLORURINE, APPEARANCEUR, [place], [place], [place], [place], [place], [place], [place], [place], NITRITE, LEUKOCYTESUR  Sepsis Labs: @LABRCNTIP (procalcitonin:4,lacticidven:4) )No results found for this or any previous visit (from the past 240 hour(s)).   Radiological Exams on Admission: Ct Angio Head W Or Wo Contrast  Result Date: 11/06/2015 CLINICAL DATA:  Sudden onset of headache and slurred speech. Code stroke.  Abnormal CT head. History diabetes and hypertension. EXAM: CT ANGIOGRAPHY HEAD AND NECK TECHNIQUE: Multidetector CT imaging of the head and neck was performed using the standard protocol during bolus administration of intravenous contrast. Multiplanar CT image reconstructions and MIPs were obtained to evaluate the vascular anatomy. Carotid stenosis measurements (when applicable) are obtained utilizing NASCET criteria, using the distal internal carotid diameter as the denominator. CONTRAST:  50 mL Isovue 370 IV COMPARISON:  CT head 11/06/2015 FINDINGS: CTA NECK Aortic arch: Normal aortic arch. Proximal great vessels widely patent without stenosis. Lung apices clear. Right carotid system: Right carotid artery is widely patent without stenosis. No atherosclerotic disease or dissection. Vascular tortuosity noted. Left carotid system: Left carotid artery widely patent without stenosis. No dissection or atherosclerotic disease. Vascular tortuosity noted. Vertebral arteries:Both vertebral arteries are patent to the basilar without stenosis or dissection. Skeleton: No acute skeletal abnormality. Minimal degenerative change in the cervical spine. Other neck: Negative for mass or adenopathy in the neck. CTA HEAD Anterior circulation: Left cavernous carotid widely patent. There is early filling of the left cavernous sinus which is asymmetric. No significant contrast in the right cavernous sinus. In addition, the superior ophthalmic vein has dense enhancement and is distended. Dense contrast also in the veins of the superior orbit and left face. Findings compatible with cavernous carotid fistula. No significant atherosclerotic disease in the cavernous carotid. No definite aneurysm identified. Left cavernous carotid widely patent. Right anterior and middle cerebral arteries widely patent. Left anterior middle cerebral arteries widely patent without stenosis. Posterior circulation: Both vertebral arteries patent to the basilar.  PICA, superior cerebellar, posterior cerebral arteries patent. Fetal origin left posterior cerebral artery with hypoplastic left P1 segment. 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 This may be a spontaneous fistula. No underlying atherosclerotic disease or aneurysm identified. Catheter angiogram is suggested. No significant carotid or vertebral artery stenosis in the neck. I reviewed the images with Dr. Tasia Catchings at approximately 1210 hours on 11/06/2015. Electronically Signed   By: Franchot Gallo M.D.   On: 11/06/2015 12:40  Ct Angio Neck W Or Wo Contrast  Result Date: 11/06/2015 CLINICAL DATA:  Sudden onset of headache and slurred speech. Code stroke. Abnormal CT head. History diabetes and hypertension. EXAM: CT ANGIOGRAPHY HEAD AND NECK TECHNIQUE: Multidetector CT imaging of the head and neck was performed using the standard protocol during bolus administration of intravenous contrast. Multiplanar CT image reconstructions and MIPs were obtained to evaluate the vascular anatomy. Carotid stenosis measurements (when applicable) are obtained utilizing NASCET criteria, using the distal internal carotid diameter as the denominator. CONTRAST:  50 mL Isovue 370 IV COMPARISON:  CT head 11/06/2015 FINDINGS: CTA NECK Aortic arch: Normal aortic arch. Proximal great vessels widely patent without stenosis. Lung apices clear. Right carotid system: Right carotid artery is widely patent without stenosis. No atherosclerotic disease or dissection. Vascular tortuosity noted. Left carotid system: Left carotid artery widely patent without stenosis. No dissection or atherosclerotic disease. Vascular tortuosity noted. Vertebral arteries:Both vertebral arteries are patent to the basilar without stenosis or dissection. Skeleton: No acute skeletal abnormality. Minimal degenerative change in the cervical spine. Other neck:  Negative for mass or  adenopathy in the neck. CTA HEAD Anterior circulation: Left cavernous carotid widely patent. There is early filling of the left cavernous sinus which is asymmetric. No significant contrast in the right cavernous sinus. In addition, the superior ophthalmic vein has dense enhancement and is distended. Dense contrast also in the veins of the superior orbit and left face. Findings compatible with cavernous carotid fistula. No significant atherosclerotic disease in the cavernous carotid. No definite aneurysm identified. Left cavernous carotid widely patent. Right anterior and middle cerebral arteries widely patent. Left anterior middle cerebral arteries widely patent without stenosis. Posterior circulation: Both vertebral arteries patent to the basilar. PICA, superior cerebellar, posterior cerebral arteries patent. Fetal origin left posterior cerebral artery with hypoplastic left P1 segment. 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 This may be a spontaneous fistula. No underlying atherosclerotic disease or aneurysm identified. Catheter angiogram is suggested. No significant carotid or vertebral artery stenosis in the neck. I reviewed the images with Dr. Tasia Catchings at approximately 1210 hours on 11/06/2015. Electronically Signed   By: Franchot Gallo M.D.   On: 11/06/2015 12:40  Mr Angiogram Head Wo Contrast  Result Date: 11/06/2015 CLINICAL DATA:  Headache. Slurred speech. Last seen normal, earlier today. EXAM: MRI HEAD WITHOUT CONTRAST MRA HEAD WITHOUT CONTRAST TECHNIQUE: Multiplanar, multiecho pulse sequences of the brain and surrounding structures were obtained without intravenous contrast. Angiographic images of the head were obtained using MRA technique without contrast. COMPARISON:  None. CTA head neck earlier today. FINDINGS: MRI HEAD FINDINGS No acute stroke, acute hemorrhage, mass  lesion, hydrocephalus, or extra-axial fluid. Normal cerebral volume. No white matter disease. Asymmetrically prominent cavernous and supraclinoid ICA on the LEFT. Bulging LEFT cavernous sinus consistent with the CT diagnosis of CC fistula. Prominent supraorbital vein on the LEFT. Mild LEFT proptosis. Arterial flow voids are maintained. Pituitary, pineal, and cerebellar tonsils unremarkable. No upper cervical lesions. Extracranial soft tissues unremarkable. MRA HEAD FINDINGS Both internal carotid arteries are widely patent, with the LEFT larger, particularly in its cavernous and supraclinoid segments. Direct communication to the cavernous sinus is not established on this noncontrast MRA technique. The supraclinoid ICA and ICA terminus are more vertically oriented on the LEFT than RIGHT, likely related to the fistula. No intracranial anterior circulation stenosis or large vessel occlusion. Normal basilar artery. Vertebrals both contribute to formation of basilar, with a estimated 50-75% stenosis of the distal V4 segment on the RIGHT. Fetal origin LEFT PCA. IMPRESSION: No evidence for acute stroke or mass lesion. No cause seen for slurred speech. 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 LEFT ICA larger than the RIGHT related to shunt physiology. Exact site of communication is not established. 50-75% stenosis distal V4 segment, RIGHT vertebral. Electronically Signed   By: Staci Righter M.D.   On: 11/06/2015 15:27  Mr Brain Wo Contrast  Result Date: 11/06/2015 CLINICAL DATA:  Headache. Slurred speech. Last seen normal, earlier today. EXAM: MRI HEAD WITHOUT CONTRAST MRA HEAD WITHOUT CONTRAST TECHNIQUE: Multiplanar, multiecho pulse sequences of the brain and surrounding structures were obtained without intravenous contrast. Angiographic images of the head were obtained using MRA technique without contrast. COMPARISON:  None. CTA head neck earlier today.  FINDINGS: MRI HEAD FINDINGS No acute stroke, acute hemorrhage, mass lesion, hydrocephalus, or extra-axial fluid. Normal cerebral volume. No white matter disease. Asymmetrically prominent cavernous and supraclinoid ICA on the LEFT. Bulging LEFT cavernous sinus consistent with the CT  diagnosis of CC fistula. Prominent supraorbital vein on the LEFT. Mild LEFT proptosis. Arterial flow voids are maintained. Pituitary, pineal, and cerebellar tonsils unremarkable. No upper cervical lesions. Extracranial soft tissues unremarkable. MRA HEAD FINDINGS Both internal carotid arteries are widely patent, with the LEFT larger, particularly in its cavernous and supraclinoid segments. Direct communication to the cavernous sinus is not established on this noncontrast MRA technique. The supraclinoid ICA and ICA terminus are more vertically oriented on the LEFT than RIGHT, likely related to the fistula. No intracranial anterior circulation stenosis or large vessel occlusion. Normal basilar artery. Vertebrals both contribute to formation of basilar, with a estimated 50-75% stenosis of the distal V4 segment on the RIGHT. Fetal origin LEFT PCA. IMPRESSION: No evidence for acute stroke or mass lesion. No cause seen for slurred speech. 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 LEFT ICA larger than the RIGHT related to shunt physiology. Exact site of communication is not established. 50-75% stenosis distal V4 segment, RIGHT vertebral. Electronically Signed   By: Staci Righter M.D.   On: 11/06/2015 15:27  Ct Head Code Stroke Wo Contrast`  Addendum Date: 11/06/2015   ADDENDUM REPORT: 11/06/2015 11:57 ADDENDUM: Critical Value/emergent results were called by telephone at the time of interpretation on 11/06/2015 at 11:56 am to Dr. Elson Clan, who verbally acknowledged these results. Electronically Signed   By: Lowella Grip III M.D.   On: 11/06/2015 11:57  Result Date:  11/06/2015 CLINICAL DATA:  Acute onset headache with slurred speech and sensory deficits EXAM: CT HEAD WITHOUT CONTRAST TECHNIQUE: Contiguous axial images were obtained from the base of the skull through the vertex without intravenous contrast. COMPARISON:  None. FINDINGS: The ventricles are normal in size and configuration. There is no intracranial mass, hemorrhage, extra-axial fluid collection, or midline shift. Gray-white compartments appear normal. No acute infarct is evident currently. There is a focal area of hyperdensity in the distal most aspect of the left internal carotid artery at the junction with the origin of the left middle cerebral artery. No other hyperdensity seen. Vascular calcification is evident. The bony calvarium appears intact. Mastoid air cells are clear. Visualized paranasal sinuses are normal. Orbits appear symmetric bilaterally. A metallic foreign body is noted adjacent to the right orbit laterally, a cosmetic ring. IMPRESSION: Focal hyperdensity in the distal most aspect of the left internal carotid artery extending into the origin of the left middle cerebral artery. This finding is best appreciated on axial slices 11 and 12, series 2. This finding raises concern for localized vessel thrombus with earliest changes of infarct on the left. No gray-white compartment lesions are currently seen. No hemorrhage. Study otherwise unremarkable. Electronically Signed: By: Lowella Grip III M.D. On: 11/06/2015 11:54   EKG: Independently reviewed.  Assessment/Plan Active Problems:   Slurred speech    Deconditioning Consult PT/OT/Nutrition  DVT prophylaxis: Lovenox Heparin SCD's anticoagulated with Coumadin Xarelto None  Code Status:   Full    Partial  DNR Family Communication:  Discussed with patient wife husband  Disposition Plan: Expect patient to be discharged to home after condition improves Consults called:    None Admission status:Tele  Obs  Inpatient  Medsurg  SDU     [facility], PA-C [facility]   If 7PM-7AM, please contact night-coverage [email or web address] Password [facility]  11/06/2015, 4:29 PM

## 2015-11-06 NOTE — Consult Note (Signed)
Consult   Requestig physician: Dr. Venora Maples   Reason for Consult: Headache with minimal slurring of speech.   HPI:                                                                                                                                          Tammy Strickland is an 55 y.o. female Tammy Strickland reports these symptoms began around 63 AM. Tammy Strickland reports that she typically does not have an issue with headaches and has no prior history of migraine. Tammy Strickland describes the headache as being frontal in location she reports no other focal neurological complaints and she has no evidence of facial asymmetry. Ocular evaluation reveals no evidence of proptosis or chemosis.  Tammy Strickland was taken urgently to the CT scanner. The CT revealed evidence of a possible dense left MCA sign. A CT angiogram was requested. 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 The stroke team and interventional neuroradiology were notified of these findings.        Past Medical History:  Diagnosis Date  . Anxiety   . Arthritis   . COPD (chronic obstructive pulmonary disease) ([place])   . Coronary artery disease   . Depression   . Diabetes mellitus without complication ([place])   . GERD (gastroesophageal reflux disease)   . Hypertension   . Neuromuscular disorder ([place])   . Osteoporosis          Past Surgical History:  Procedure Laterality Date  . ABDOMINAL HYSTERECTOMY    . APPENDECTOMY    . CHOLECYSTECTOMY    . TONSILLECTOMY      MEDICATIONS:                                                                                                                     I have reviewed the patient's current medications.  No Known Allergies   Social History:  reports that she has been smoking Cigarettes.  She has a 35.00 pack-year smoking history. She has never used smokeless tobacco. She reports that she does not drink alcohol or use drugs.        Family  History  Problem Relation Age of Onset  . Hyperlipidemia Mother   . Hypertension Mother   . Hyperlipidemia Father   . Hypertension Father   . Stroke Father   . Hypertension Sister   .  Cancer Sister     breast cancer  . Crohn's disease Sister      ROS:                                                                                                                                       History obtained from chart review  General ROS: negative for - chills, fatigue, fever, night sweats, weight gain or weight loss Psychological ROS: negative for - behavioral disorder, hallucinations, memory difficulties, mood swings or suicidal ideation Ophthalmic ROS: negative for - blurry vision, double vision, eye pain or loss of vision ENT ROS: negative for - epistaxis, nasal discharge, oral lesions, sore throat, tinnitus or vertigo Allergy and Immunology ROS: negative for - hives or itchy/watery eyes Hematological and Lymphatic ROS: negative for - bleeding problems, bruising or swollen lymph nodes Endocrine ROS: negative for - galactorrhea, hair pattern changes, polydipsia/polyuria or temperature intolerance Respiratory ROS: negative for - cough, hemoptysis, shortness of breath or wheezing Cardiovascular ROS: negative for - chest pain, dyspnea on exertion, edema or irregular heartbeat Gastrointestinal ROS: negative for - abdominal pain, diarrhea, hematemesis, nausea/vomiting or stool incontinence Genito-Urinary ROS: negative for - dysuria, hematuria, incontinence or urinary frequency/urgency Musculoskeletal ROS: negative for - joint swelling or muscular weakness Neurological ROS: as noted in HPI Dermatological ROS: negative for rash and skin lesion changes   General Exam                                                                                                      Blood pressure 102/76, pulse 60, temperature 98 F (36.7 C), temperature source Oral, resp. rate 20, height 5'  7.5" (1.715 m), weight (!) 138.8 kg (306 lb), SpO2 97 %. HEENT-  Normocephalic, no lesions, without obvious abnormality.  Normal external eye and conjunctiva.  Normal TM's bilaterally.  Normal auditory canals and external ears. Normal external nose, mucus membranes and septum.  Normal pharynx. Cardiovascular- regular rate and rhythm, S1, S2 normal, no murmur, click, rub or gallop, pulses palpable throughout   Lungs- chest clear, no wheezing, rales, normal symmetric air entry, Heart exam - S1, S2 normal, no murmur, no gallop, rate regular Abdomen- soft, non-tender; bowel sounds normal; no masses,  no organomegaly Extremities- less then 2 second capillary refill Lymph-no adenopathy palpable Musculoskeletal-no joint tenderness, deformity or swelling Skin-warm and dry, no hyperpigmentation, vitiligo, or suspicious lesions  Neurological Examination Mental Status: Alert, oriented, thought content appropriate.  Speech fluent without evidence of aphasia. Subtle and minimal  slurring of speech was noted.  Able to follow 3 step commands without difficulty. Cranial Nerves: II: Discs flat bilaterally; Visual fields grossly normal, pupils equal, round, reactive to light and accommodation III,IV, VI: ptosis not present, extra-ocular motions intact bilaterally V,VII: smile symmetric, facial light touch sensation normal bilaterally VIII: hearing normal bilaterally IX,X: uvula rises symmetrically XI: bilateral shoulder shrug XII: midline tongue extension Motor: Right :            Upper extremity   5/5                                                                              Left:     Upper extremity   5/5                       Lower extremity   5/5                                                                                                    Lower extremity   5/5 Tone and bulk:normal tone throughout; no atrophy noted Sensory: Decreased distally to pinprick and light touch in the lower  extremities. Deep Tendon Reflexes: 2+ and symmetric throughout Plantars: Right: downgoing                                                              Left: downgoing Cerebellar: normal finger-to-nose, normal rapid alternating movements       Lab Results: Basic Metabolic Panel:  Last Labs    Recent Labs Lab 10/31/15 1742 11/06/15 1122 11/06/15 1137  NA 137 139 141  K 4.0 4.4 4.1  CL 104 106 101  CO2 25 27  --   GLUCOSE 142* 187* 189*  BUN 15 10 11   CREATININE 0.92 0.82 0.70  CALCIUM 8.9 9.0  --       Liver Function Tests:  Last Labs    Recent Labs Lab 10/31/15 1742 11/06/15 1122  AST 34 27  ALT 29 27  ALKPHOS 97 110  BILITOT 0.3 0.2*  PROT 6.0* 6.9  ALBUMIN 3.3* 3.3*      Last Labs    Recent Labs Lab 10/31/15 1742  LIPASE 18     Last Labs   No results for input(s): AMMONIA in the last 168 hours.    CBC:  Last Labs    Recent Labs Lab 10/31/15 1600 11/01/15 0156 11/06/15 1122 11/06/15 1137  WBC 11.9* 13.4* 8.1  --   NEUTROABS 6.6  --  3.5  --   HGB 15.2* 14.7 14.0 14.6  HCT 45.3 44.5 43.3 43.0  MCV 83.1 84.8 85.7  --   PLT PLATELET CLUMPS NOTED ON SMEAR, COUNT APPEARS ADEQUATE 247 296  --       Cardiac Enzymes:  Last Labs    Recent Labs Lab 10/31/15 2044 11/01/15 0156  TROPONINI <0.03 <0.03      Lipid Panel: Last Labs   No results for input(s): CHOL, TRIG, HDL, CHOLHDL, VLDL, LDLCALC in the last 168 hours.    CBG:  Last Labs    Recent Labs Lab 11/06/15 1117 11/06/15 1209  GLUCAP 192* 147*      Microbiology: No results found for this or any previous visit.  Coagulation Studies:  Recent Labs (last 2 labs)    Recent Labs  11/06/15 1122  LABPROT 13.7  INR 1.05      Imaging:  Imaging Results (Last 48 hours)  Ct Angio Head W Or Wo Contrast  Result Date: 11/06/2015 CLINICAL DATA:  Sudden onset of headache and slurred speech. Code stroke. Abnormal CT head. History diabetes  and hypertension. EXAM: CT ANGIOGRAPHY HEAD AND NECK TECHNIQUE: Multidetector CT imaging of the head and neck was performed using the standard protocol during bolus administration of intravenous contrast. Multiplanar CT image reconstructions and MIPs were obtained to evaluate the vascular anatomy. Carotid stenosis measurements (when applicable) are obtained utilizing NASCET criteria, using the distal internal carotid diameter as the denominator. CONTRAST:  50 mL Isovue 370 IV COMPARISON:  CT head 11/06/2015 FINDINGS: CTA NECK Aortic arch: Normal aortic arch. Proximal great vessels widely patent without stenosis. Lung apices clear. Right carotid system: Right carotid artery is widely patent without stenosis. No atherosclerotic disease or dissection. Vascular tortuosity noted. Left carotid system: Left carotid artery widely patent without stenosis. No dissection or atherosclerotic disease. Vascular tortuosity noted. Vertebral arteries:Both vertebral arteries are patent to the basilar without stenosis or dissection. Skeleton: No acute skeletal abnormality. Minimal degenerative change in the cervical spine. Other neck: Negative for mass or adenopathy in the neck. CTA HEAD Anterior circulation: Left cavernous carotid widely patent. There is early filling of the left cavernous sinus which is asymmetric. No significant contrast in the right cavernous sinus. In addition, the superior ophthalmic vein has dense enhancement and is distended. Dense contrast also in the veins of the superior orbit and left face. Findings compatible with cavernous carotid fistula. No significant atherosclerotic disease in the cavernous carotid. No definite aneurysm identified. Left cavernous carotid widely patent. Right anterior and middle cerebral arteries widely patent. Left anterior middle cerebral arteries widely patent without stenosis. Posterior circulation: Both vertebral arteries patent to the basilar. PICA, superior cerebellar, posterior  cerebral arteries patent. Fetal origin left posterior cerebral artery with hypoplastic left P1 segment. 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 This may be a spontaneous fistula. No underlying atherosclerotic disease or aneurysm identified. Catheter angiogram is suggested. No significant carotid or vertebral artery stenosis in the neck. I reviewed the images with Dr. Tasia Catchings at approximately 1210 hours on 11/06/2015. Electronically Signed   By: Franchot Gallo M.D.   On: 11/06/2015 12:40  Ct Angio Neck W Or Wo Contrast  Result Date: 11/06/2015 CLINICAL DATA:  Sudden onset of headache and slurred speech. Code stroke. Abnormal CT head. History diabetes and hypertension. EXAM: CT ANGIOGRAPHY HEAD AND NECK TECHNIQUE: Multidetector CT imaging of the head and neck was performed using the standard protocol during bolus administration of intravenous contrast. Multiplanar CT image reconstructions  and MIPs were obtained to evaluate the vascular anatomy. Carotid stenosis measurements (when applicable) are obtained utilizing NASCET criteria, using the distal internal carotid diameter as the denominator. CONTRAST:  50 mL Isovue 370 IV COMPARISON:  CT head 11/06/2015 FINDINGS: CTA NECK Aortic arch: Normal aortic arch. Proximal great vessels widely patent without stenosis. Lung apices clear. Right carotid system: Right carotid artery is widely patent without stenosis. No atherosclerotic disease or dissection. Vascular tortuosity noted. Left carotid system: Left carotid artery widely patent without stenosis. No dissection or atherosclerotic disease. Vascular tortuosity noted. Vertebral arteries:Both vertebral arteries are patent to the basilar without stenosis or dissection. Skeleton: No acute skeletal abnormality. Minimal degenerative change in the cervical spine. Other neck: Negative for mass or adenopathy in the  neck. CTA HEAD Anterior circulation: Left cavernous carotid widely patent. There is early filling of the left cavernous sinus which is asymmetric. No significant contrast in the right cavernous sinus. In addition, the superior ophthalmic vein has dense enhancement and is distended. Dense contrast also in the veins of the superior orbit and left face. Findings compatible with cavernous carotid fistula. No significant atherosclerotic disease in the cavernous carotid. No definite aneurysm identified. Left cavernous carotid widely patent. Right anterior and middle cerebral arteries widely patent. Left anterior middle cerebral arteries widely patent without stenosis. Posterior circulation: Both vertebral arteries patent to the basilar. PICA, superior cerebellar, posterior cerebral arteries patent. Fetal origin left posterior cerebral artery with hypoplastic left P1 segment. 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 This may be a spontaneous fistula. No underlying atherosclerotic disease or aneurysm identified. Catheter angiogram is suggested. No significant carotid or vertebral artery stenosis in the neck. I reviewed the images with Dr. Tasia Catchings at approximately 1210 hours on 11/06/2015. Electronically Signed   By: Franchot Gallo M.D.   On: 11/06/2015 12:40  Ct Head Code Stroke Wo Contrast`  Addendum Date: 11/06/2015   ADDENDUM REPORT: 11/06/2015 11:57 ADDENDUM: Critical Value/emergent results were called by telephone at the time of interpretation on 11/06/2015 at 11:56 am to Dr. Elson Clan, who verbally acknowledged these results. Electronically Signed   By: Lowella Grip III M.D.   On: 11/06/2015 11:57  Result Date: 11/06/2015 CLINICAL DATA:  Acute onset headache with slurred speech and sensory deficits EXAM: CT HEAD WITHOUT CONTRAST TECHNIQUE: Contiguous axial images were obtained from the  base of the skull through the vertex without intravenous contrast. COMPARISON:  None. FINDINGS: The ventricles are normal in size and configuration. There is no intracranial mass, hemorrhage, extra-axial fluid collection, or midline shift. Gray-white compartments appear normal. No acute infarct is evident currently. There is a focal area of hyperdensity in the distal most aspect of the left internal carotid artery at the junction with the origin of the left middle cerebral artery. No other hyperdensity seen. Vascular calcification is evident. The bony calvarium appears intact. Mastoid air cells are clear. Visualized paranasal sinuses are normal. Orbits appear symmetric bilaterally. A metallic foreign body is noted adjacent to the right orbit laterally, a cosmetic ring. IMPRESSION: Focal hyperdensity in the distal most aspect of the left internal carotid artery extending into the origin of the left middle cerebral artery. This finding is best appreciated on axial slices 11 and 12, series 2. This finding raises concern for localized vessel thrombus with earliest changes of infarct on the left. No gray-white compartment lesions are currently seen. No hemorrhage. Study otherwise unremarkable. Electronically Signed: By: Gwyndolyn Saxon  Jasmine December III M.D. On: 11/06/2015 11:54    Assessment/Plan:  Tammy Strickland is a pleasant 55 year old patient who presents today with headache and some minimal slurring of speech. Tammy Strickland reports no prior history of migraine or other headaches. She has no evidence of proptosis or chemosis. Her neurological exam is otherwise normal with no focal neurological deficits and no evidence of cerebellar dysfunction.  Her CT revealed evidence of a focal hyperintensity in the distal most aspect of the left internal carotid artery extending into the origin of the left middle cerebral artery. Given these findings a CT angiogram was requested which revealed findings consistent with a left carotid cavernous  fistula. There is no clear evidence of an aneurysm.  These findings have been discussed with our stroke and neuro interventional radiology team. Given that Tammy Strickland appears stable at this time she will be admitted to the stroke service. The plan is for an angiogram tomorrow morning to further evaluate this finding. We will also be requesting MRI and MRA for further evaluation of the relatively mild slurring of speech that she had been experiencing. This finding is actually resolved at this point.  Plan:  1. MRI and MRA are to be completed.  2. Interventional neuroradiology plans cerebral angiogram tomorrow.  3. Close neurological evaluation. Also, follow closely for any ocular changes.    James A. Tasia Catchings, M.D. Neurohospitalist Phone: [phone]  11/06/2015, 12:48 PM

## 2015-11-06 NOTE — H&P (Signed)
History and physical  Requestig physician: Dr. Venora Maples   Reason for Consult: Headache with minimal slurring of speech.   HPI:                                                                                                                                          Tammy Strickland is an 55 y.o. female [place] reports these symptoms began around 25 AM. Tammy Strickland reports that she typically does not have an issue with headaches and has no prior history of migraine. Tammy Strickland describes the headache as being frontal in location she reports no other focal neurological complaints and she has no evidence of facial asymmetry. Ocular evaluation reveals no evidence of proptosis or chemosis.  Tammy Strickland was taken urgently to the CT scanner. The CT revealed evidence of a possible dense left MCA sign. A CT angiogram was requested. 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 The stroke team and interventional neuroradiology were notified of these findings.        Past Medical History:  Diagnosis Date  . Anxiety   . Arthritis   . COPD (chronic obstructive pulmonary disease) ([place])   . Coronary artery disease   . Depression   . Diabetes mellitus without complication ([place])   . GERD (gastroesophageal reflux disease)   . Hypertension   . Neuromuscular disorder ([place])   . Osteoporosis          Past Surgical History:  Procedure Laterality Date  . ABDOMINAL HYSTERECTOMY    . APPENDECTOMY    . CHOLECYSTECTOMY    . TONSILLECTOMY      MEDICATIONS:                                                                                                                     I have reviewed the patient's current medications.  No Known Allergies   Social History:  reports that she has been smoking Cigarettes.  She has a 35.00 pack-year smoking history. She has never used smokeless tobacco. She reports that she does not drink alcohol or use drugs.         Family History  Problem Relation Age of Onset  . Hyperlipidemia Mother   . Hypertension Mother   . Hyperlipidemia Father   . Hypertension Father   . Stroke Father   . Hypertension Sister   .  Cancer Sister     breast cancer  . Crohn's disease Sister      ROS:                                                                                                                                       History obtained from chart review  General ROS: negative for - chills, fatigue, fever, night sweats, weight gain or weight loss Psychological ROS: negative for - behavioral disorder, hallucinations, memory difficulties, mood swings or suicidal ideation Ophthalmic ROS: negative for - blurry vision, double vision, eye pain or loss of vision ENT ROS: negative for - epistaxis, nasal discharge, oral lesions, sore throat, tinnitus or vertigo Allergy and Immunology ROS: negative for - hives or itchy/watery eyes Hematological and Lymphatic ROS: negative for - bleeding problems, bruising or swollen lymph nodes Endocrine ROS: negative for - galactorrhea, hair pattern changes, polydipsia/polyuria or temperature intolerance Respiratory ROS: negative for - cough, hemoptysis, shortness of breath or wheezing Cardiovascular ROS: negative for - chest pain, dyspnea on exertion, edema or irregular heartbeat Gastrointestinal ROS: negative for - abdominal pain, diarrhea, hematemesis, nausea/vomiting or stool incontinence Genito-Urinary ROS: negative for - dysuria, hematuria, incontinence or urinary frequency/urgency Musculoskeletal ROS: negative for - joint swelling or muscular weakness Neurological ROS: as noted in HPI Dermatological ROS: negative for rash and skin lesion changes   General Exam                                                                                                      Blood pressure 102/76, pulse 60, temperature 98 F (36.7 C), temperature source Oral, resp. rate 20,  height 5' 7.5" (1.715 m), weight (!) 138.8 kg (306 lb), SpO2 97 %. HEENT-  Normocephalic, no lesions, without obvious abnormality.  Normal external eye and conjunctiva.  Normal TM's bilaterally.  Normal auditory canals and external ears. Normal external nose, mucus membranes and septum.  Normal pharynx. Cardiovascular- regular rate and rhythm, S1, S2 normal, no murmur, click, rub or gallop, pulses palpable throughout   Lungs- chest clear, no wheezing, rales, normal symmetric air entry, Heart exam - S1, S2 normal, no murmur, no gallop, rate regular Abdomen- soft, non-tender; bowel sounds normal; no masses,  no organomegaly Extremities- less then 2 second capillary refill Lymph-no adenopathy palpable Musculoskeletal-no joint tenderness, deformity or swelling Skin-warm and dry, no hyperpigmentation, vitiligo, or suspicious lesions  Neurological Examination Mental Status: Alert, oriented, thought content appropriate.  Speech fluent without evidence of aphasia. Subtle and minimal  slurring of speech was noted.  Able to follow 3 step commands without difficulty. Cranial Nerves: II: Discs flat bilaterally; Visual fields grossly normal, pupils equal, round, reactive to light and accommodation III,IV, VI: ptosis not present, extra-ocular motions intact bilaterally V,VII: smile symmetric, facial light touch sensation normal bilaterally VIII: hearing normal bilaterally IX,X: uvula rises symmetrically XI: bilateral shoulder shrug XII: midline tongue extension Motor: Right :            Upper extremity   5/5                                                                              Left:     Upper extremity   5/5                       Lower extremity   5/5                                                                                                    Lower extremity   5/5 Tone and bulk:normal tone throughout; no atrophy noted Sensory: Decreased distally to pinprick and light touch in the lower  extremities. Deep Tendon Reflexes: 2+ and symmetric throughout Plantars: Right: downgoing                                                              Left: downgoing Cerebellar: normal finger-to-nose, normal rapid alternating movements       Lab Results: Basic Metabolic Panel:  Last Labs    Recent Labs Lab 10/31/15 1742 11/06/15 1122 11/06/15 1137  NA 137 139 141  K 4.0 4.4 4.1  CL 104 106 101  CO2 25 27  --   GLUCOSE 142* 187* 189*  BUN 15 10 11   CREATININE 0.92 0.82 0.70  CALCIUM 8.9 9.0  --       Liver Function Tests:  Last Labs    Recent Labs Lab 10/31/15 1742 11/06/15 1122  AST 34 27  ALT 29 27  ALKPHOS 97 110  BILITOT 0.3 0.2*  PROT 6.0* 6.9  ALBUMIN 3.3* 3.3*      Last Labs    Recent Labs Lab 10/31/15 1742  LIPASE 18     Last Labs   No results for input(s): AMMONIA in the last 168 hours.    CBC:  Last Labs    Recent Labs Lab 10/31/15 1600 11/01/15 0156 11/06/15 1122 11/06/15 1137  WBC 11.9* 13.4* 8.1  --   NEUTROABS 6.6  --  3.5  --   HGB 15.2* 14.7 14.0 14.6  HCT 45.3 44.5 43.3 43.0  MCV 83.1 84.8 85.7  --   PLT PLATELET CLUMPS NOTED ON SMEAR, COUNT APPEARS ADEQUATE 247 296  --       Cardiac Enzymes:  Last Labs    Recent Labs Lab 10/31/15 2044 11/01/15 0156  TROPONINI <0.03 <0.03      Lipid Panel: Last Labs   No results for input(s): CHOL, TRIG, HDL, CHOLHDL, VLDL, LDLCALC in the last 168 hours.    CBG:  Last Labs    Recent Labs Lab 11/06/15 1117 11/06/15 1209  GLUCAP 192* 147*      Microbiology: No results found for this or any previous visit.  Coagulation Studies:  Recent Labs (last 2 labs)    Recent Labs  11/06/15 1122  LABPROT 13.7  INR 1.05      Imaging:  Imaging Results (Last 48 hours)  Ct Angio Head W Or Wo Contrast  Result Date: 11/06/2015 CLINICAL DATA:  Sudden onset of headache and slurred speech. Code stroke. Abnormal CT head. History diabetes  and hypertension. EXAM: CT ANGIOGRAPHY HEAD AND NECK TECHNIQUE: Multidetector CT imaging of the head and neck was performed using the standard protocol during bolus administration of intravenous contrast. Multiplanar CT image reconstructions and MIPs were obtained to evaluate the vascular anatomy. Carotid stenosis measurements (when applicable) are obtained utilizing NASCET criteria, using the distal internal carotid diameter as the denominator. CONTRAST:  50 mL Isovue 370 IV COMPARISON:  CT head 11/06/2015 FINDINGS: CTA NECK Aortic arch: Normal aortic arch. Proximal great vessels widely patent without stenosis. Lung apices clear. Right carotid system: Right carotid artery is widely patent without stenosis. No atherosclerotic disease or dissection. Vascular tortuosity noted. Left carotid system: Left carotid artery widely patent without stenosis. No dissection or atherosclerotic disease. Vascular tortuosity noted. Vertebral arteries:Both vertebral arteries are patent to the basilar without stenosis or dissection. Skeleton: No acute skeletal abnormality. Minimal degenerative change in the cervical spine. Other neck: Negative for mass or adenopathy in the neck. CTA HEAD Anterior circulation: Left cavernous carotid widely patent. There is early filling of the left cavernous sinus which is asymmetric. No significant contrast in the right cavernous sinus. In addition, the superior ophthalmic vein has dense enhancement and is distended. Dense contrast also in the veins of the superior orbit and left face. Findings compatible with cavernous carotid fistula. No significant atherosclerotic disease in the cavernous carotid. No definite aneurysm identified. Left cavernous carotid widely patent. Right anterior and middle cerebral arteries widely patent. Left anterior middle cerebral arteries widely patent without stenosis. Posterior circulation: Both vertebral arteries patent to the basilar. PICA, superior cerebellar, posterior  cerebral arteries patent. Fetal origin left posterior cerebral artery with hypoplastic left P1 segment. 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 This may be a spontaneous fistula. No underlying atherosclerotic disease or aneurysm identified. Catheter angiogram is suggested. No significant carotid or vertebral artery stenosis in the neck. I reviewed the images with Dr. Tasia Catchings at approximately 1210 hours on 11/06/2015. Electronically Signed   By: Franchot Gallo M.D.   On: 11/06/2015 12:40  Ct Angio Neck W Or Wo Contrast  Result Date: 11/06/2015 CLINICAL DATA:  Sudden onset of headache and slurred speech. Code stroke. Abnormal CT head. History diabetes and hypertension. EXAM: CT ANGIOGRAPHY HEAD AND NECK TECHNIQUE: Multidetector CT imaging of the head and neck was performed using the standard protocol during bolus administration of intravenous contrast. Multiplanar CT image reconstructions  and MIPs were obtained to evaluate the vascular anatomy. Carotid stenosis measurements (when applicable) are obtained utilizing NASCET criteria, using the distal internal carotid diameter as the denominator. CONTRAST:  50 mL Isovue 370 IV COMPARISON:  CT head 11/06/2015 FINDINGS: CTA NECK Aortic arch: Normal aortic arch. Proximal great vessels widely patent without stenosis. Lung apices clear. Right carotid system: Right carotid artery is widely patent without stenosis. No atherosclerotic disease or dissection. Vascular tortuosity noted. Left carotid system: Left carotid artery widely patent without stenosis. No dissection or atherosclerotic disease. Vascular tortuosity noted. Vertebral arteries:Both vertebral arteries are patent to the basilar without stenosis or dissection. Skeleton: No acute skeletal abnormality. Minimal degenerative change in the cervical spine. Other neck: Negative for mass or adenopathy in the  neck. CTA HEAD Anterior circulation: Left cavernous carotid widely patent. There is early filling of the left cavernous sinus which is asymmetric. No significant contrast in the right cavernous sinus. In addition, the superior ophthalmic vein has dense enhancement and is distended. Dense contrast also in the veins of the superior orbit and left face. Findings compatible with cavernous carotid fistula. No significant atherosclerotic disease in the cavernous carotid. No definite aneurysm identified. Left cavernous carotid widely patent. Right anterior and middle cerebral arteries widely patent. Left anterior middle cerebral arteries widely patent without stenosis. Posterior circulation: Both vertebral arteries patent to the basilar. PICA, superior cerebellar, posterior cerebral arteries patent. Fetal origin left posterior cerebral artery with hypoplastic left P1 segment. 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 This may be a spontaneous fistula. No underlying atherosclerotic disease or aneurysm identified. Catheter angiogram is suggested. No significant carotid or vertebral artery stenosis in the neck. I reviewed the images with Dr. Tasia Catchings at approximately 1210 hours on 11/06/2015. Electronically Signed   By: Franchot Gallo M.D.   On: 11/06/2015 12:40  Ct Head Code Stroke Wo Contrast`  Addendum Date: 11/06/2015   ADDENDUM REPORT: 11/06/2015 11:57 ADDENDUM: Critical Value/emergent results were called by telephone at the time of interpretation on 11/06/2015 at 11:56 am to Dr. Elson Clan, who verbally acknowledged these results. Electronically Signed   By: Lowella Grip III M.D.   On: 11/06/2015 11:57  Result Date: 11/06/2015 CLINICAL DATA:  Acute onset headache with slurred speech and sensory deficits EXAM: CT HEAD WITHOUT CONTRAST TECHNIQUE: Contiguous axial images were obtained from the  base of the skull through the vertex without intravenous contrast. COMPARISON:  None. FINDINGS: The ventricles are normal in size and configuration. There is no intracranial mass, hemorrhage, extra-axial fluid collection, or midline shift. Gray-white compartments appear normal. No acute infarct is evident currently. There is a focal area of hyperdensity in the distal most aspect of the left internal carotid artery at the junction with the origin of the left middle cerebral artery. No other hyperdensity seen. Vascular calcification is evident. The bony calvarium appears intact. Mastoid air cells are clear. Visualized paranasal sinuses are normal. Orbits appear symmetric bilaterally. A metallic foreign body is noted adjacent to the right orbit laterally, a cosmetic ring. IMPRESSION: Focal hyperdensity in the distal most aspect of the left internal carotid artery extending into the origin of the left middle cerebral artery. This finding is best appreciated on axial slices 11 and 12, series 2. This finding raises concern for localized vessel thrombus with earliest changes of infarct on the left. No gray-white compartment lesions are currently seen. No hemorrhage. Study otherwise unremarkable. Electronically Signed: By: Gwyndolyn Saxon  Jasmine December III M.D. On: 11/06/2015 11:54    Assessment/Plan:  Tammy Strickland is a pleasant 55 year old patient who presents today with headache and some minimal slurring of speech. Tammy Strickland reports no prior history of migraine or other headaches. She has no evidence of proptosis or chemosis. Her neurological exam is otherwise normal with no focal neurological deficits and no evidence of cerebellar dysfunction.  Her CT revealed evidence of a focal hyperintensity in the distal most aspect of the left internal carotid artery extending into the origin of the left middle cerebral artery. Given these findings a CT angiogram was requested which revealed findings consistent with a left carotid cavernous  fistula. There is no clear evidence of an aneurysm.  These findings have been discussed with our stroke and neuro interventional radiology team. Given that Tammy Strickland appears stable at this time she will be admitted to the stroke service. The plan is for an angiogram tomorrow morning to further evaluate this finding. We will also be requesting MRI and MRA for further evaluation of the relatively mild slurring of speech that she had been experiencing. This finding is actually resolved at this point.  Plan:  1. MRI and MRA are to be completed.  2. Interventional neuroradiology plans cerebral angiogram tomorrow.  3. Close neurological evaluation. Also, follow closely for any ocular changes.    James A. Tasia Catchings, M.D. Neurohospitalist Phone: [phone]  11/06/2015, 12:48 PM

## 2015-11-06 NOTE — Consult Note (Deleted)
Initial Neurological Consultation                      NEURO HOSPITALIST CONSULT NOTE   Requestig physician: Dr. Venora Maples   Reason for Consult: Headache with minimal slurring of speech.   HPI:                                                                                                                                          Tammy Strickland is an 55 y.o. female Tammy Strickland reports these symptoms began around 66 AM. Tammy Strickland reports that she typically does not have an issue with headaches and has no prior history of migraine. Tammy Strickland describes the headache as being frontal in location she reports no other focal neurological complaints and she has no evidence of facial asymmetry. Ocular evaluation reveals no evidence of proptosis or chemosis.  Tammy Strickland was taken urgently to the CT scanner. The CT revealed evidence of a possible dense left MCA sign. A CT angiogram was requested. 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 The stroke team and interventional neuroradiology were notified of these findings.    Past Medical History:  Diagnosis Date  . Anxiety   . Arthritis   . COPD (chronic obstructive pulmonary disease) ([place])   . Coronary artery disease   . Depression   . Diabetes mellitus without complication ([place])   . GERD (gastroesophageal reflux disease)   . Hypertension   . Neuromuscular disorder ([place])   . Osteoporosis     Past Surgical History:  Procedure Laterality Date  . ABDOMINAL HYSTERECTOMY    . APPENDECTOMY    . CHOLECYSTECTOMY    . TONSILLECTOMY      MEDICATIONS:                                                                                                                     I have reviewed the patient's current medications.  No Known Allergies   Social History:  reports that she has been smoking Cigarettes.  She has a 35.00 pack-year smoking history. She has never used smokeless tobacco. She reports that she does not drink alcohol or use  drugs.  Family History  Problem Relation Age of Onset  . Hyperlipidemia Mother   . Hypertension Mother   . Hyperlipidemia Father   . Hypertension Father   . Stroke Father   . Hypertension  Sister   . Cancer Sister     breast cancer  . Crohn's disease Sister      ROS:                                                                                                                                       History obtained from chart review  General ROS: negative for - chills, fatigue, fever, night sweats, weight gain or weight loss Psychological ROS: negative for - behavioral disorder, hallucinations, memory difficulties, mood swings or suicidal ideation Ophthalmic ROS: negative for - blurry vision, double vision, eye pain or loss of vision ENT ROS: negative for - epistaxis, nasal discharge, oral lesions, sore throat, tinnitus or vertigo Allergy and Immunology ROS: negative for - hives or itchy/watery eyes Hematological and Lymphatic ROS: negative for - bleeding problems, bruising or swollen lymph nodes Endocrine ROS: negative for - galactorrhea, hair pattern changes, polydipsia/polyuria or temperature intolerance Respiratory ROS: negative for - cough, hemoptysis, shortness of breath or wheezing Cardiovascular ROS: negative for - chest pain, dyspnea on exertion, edema or irregular heartbeat Gastrointestinal ROS: negative for - abdominal pain, diarrhea, hematemesis, nausea/vomiting or stool incontinence Genito-Urinary ROS: negative for - dysuria, hematuria, incontinence or urinary frequency/urgency Musculoskeletal ROS: negative for - joint swelling or muscular weakness Neurological ROS: as noted in HPI Dermatological ROS: negative for rash and skin lesion changes   General Exam                                                                                                      Blood pressure 102/76, pulse 60, temperature 98 F (36.7 C), temperature source Oral, resp. rate 20, height 5'  7.5" (1.715 m), weight (!) 138.8 kg (306 lb), SpO2 97 %. HEENT-  Normocephalic, no lesions, without obvious abnormality.  Normal external eye and conjunctiva.  Normal TM's bilaterally.  Normal auditory canals and external ears. Normal external nose, mucus membranes and septum.  Normal pharynx. Cardiovascular- regular rate and rhythm, S1, S2 normal, no murmur, click, rub or gallop, pulses palpable throughout   Lungs- chest clear, no wheezing, rales, normal symmetric air entry, Heart exam - S1, S2 normal, no murmur, no gallop, rate regular Abdomen- soft, non-tender; bowel sounds normal; no masses,  no organomegaly Extremities- less then 2 second capillary refill Lymph-no adenopathy palpable Musculoskeletal-no joint tenderness, deformity or swelling Skin-warm and dry, no hyperpigmentation, vitiligo, or suspicious lesions  Neurological Examination Mental Status: Alert, oriented, thought content appropriate.  Speech fluent without evidence of  aphasia. Subtle and minimal slurring of speech was noted.  Able to follow 3 step commands without difficulty. Cranial Nerves: II: Discs flat bilaterally; Visual fields grossly normal, pupils equal, round, reactive to light and accommodation III,IV, VI: ptosis not present, extra-ocular motions intact bilaterally V,VII: smile symmetric, facial light touch sensation normal bilaterally VIII: hearing normal bilaterally IX,X: uvula rises symmetrically XI: bilateral shoulder shrug XII: midline tongue extension Motor: Right : Upper extremity   5/5    Left:     Upper extremity   5/5  Lower extremity   5/5     Lower extremity   5/5 Tone and bulk:normal tone throughout; no atrophy noted Sensory: Decreased distally to pinprick and light touch in the lower extremities. Deep Tendon Reflexes: 2+ and symmetric throughout Plantars: Right: downgoing   Left: downgoing Cerebellar: normal finger-to-nose, normal rapid alternating movements       Lab Results: Basic  Metabolic Panel:  Recent Labs Lab 10/31/15 1742 11/06/15 1122 11/06/15 1137  NA 137 139 141  K 4.0 4.4 4.1  CL 104 106 101  CO2 25 27  --   GLUCOSE 142* 187* 189*  BUN 15 10 11   CREATININE 0.92 0.82 0.70  CALCIUM 8.9 9.0  --     Liver Function Tests:  Recent Labs Lab 10/31/15 1742 11/06/15 1122  AST 34 27  ALT 29 27  ALKPHOS 97 110  BILITOT 0.3 0.2*  PROT 6.0* 6.9  ALBUMIN 3.3* 3.3*    Recent Labs Lab 10/31/15 1742  LIPASE 18   No results for input(s): AMMONIA in the last 168 hours.  CBC:  Recent Labs Lab 10/31/15 1600 11/01/15 0156 11/06/15 1122 11/06/15 1137  WBC 11.9* 13.4* 8.1  --   NEUTROABS 6.6  --  3.5  --   HGB 15.2* 14.7 14.0 14.6  HCT 45.3 44.5 43.3 43.0  MCV 83.1 84.8 85.7  --   PLT PLATELET CLUMPS NOTED ON SMEAR, COUNT APPEARS ADEQUATE 247 296  --     Cardiac Enzymes:  Recent Labs Lab 10/31/15 2044 11/01/15 0156  TROPONINI <0.03 <0.03    Lipid Panel: No results for input(s): CHOL, TRIG, HDL, CHOLHDL, VLDL, LDLCALC in the last 168 hours.  CBG:  Recent Labs Lab 11/06/15 1117 11/06/15 1209  GLUCAP 192* 147*    Microbiology: No results found for this or any previous visit.  Coagulation Studies:  Recent Labs  11/06/15 1122  LABPROT 13.7  INR 1.05    Imaging: Ct Angio Head W Or Wo Contrast  Result Date: 11/06/2015 CLINICAL DATA:  Sudden onset of headache and slurred speech. Code stroke. Abnormal CT head. History diabetes and hypertension. EXAM: CT ANGIOGRAPHY HEAD AND NECK TECHNIQUE: Multidetector CT imaging of the head and neck was performed using the standard protocol during bolus administration of intravenous contrast. Multiplanar CT image reconstructions and MIPs were obtained to evaluate the vascular anatomy. Carotid stenosis measurements (when applicable) are obtained utilizing NASCET criteria, using the distal internal carotid diameter as the denominator. CONTRAST:  50 mL Isovue 370 IV COMPARISON:  CT head  11/06/2015 FINDINGS: CTA NECK Aortic arch: Normal aortic arch. Proximal great vessels widely patent without stenosis. Lung apices clear. Right carotid system: Right carotid artery is widely patent without stenosis. No atherosclerotic disease or dissection. Vascular tortuosity noted. Left carotid system: Left carotid artery widely patent without stenosis. No dissection or atherosclerotic disease. Vascular tortuosity noted. Vertebral arteries:Both vertebral arteries are patent to the basilar without stenosis or dissection. Skeleton: No acute skeletal abnormality. Minimal  degenerative change in the cervical spine. Other neck: Negative for mass or adenopathy in the neck. CTA HEAD Anterior circulation: Left cavernous carotid widely patent. There is early filling of the left cavernous sinus which is asymmetric. No significant contrast in the right cavernous sinus. In addition, the superior ophthalmic vein has dense enhancement and is distended. Dense contrast also in the veins of the superior orbit and left face. Findings compatible with cavernous carotid fistula. No significant atherosclerotic disease in the cavernous carotid. No definite aneurysm identified. Left cavernous carotid widely patent. Right anterior and middle cerebral arteries widely patent. Left anterior middle cerebral arteries widely patent without stenosis. Posterior circulation: Both vertebral arteries patent to the basilar. PICA, superior cerebellar, posterior cerebral arteries patent. Fetal origin left posterior cerebral artery with hypoplastic left P1 segment. 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 This may be a spontaneous fistula. No underlying atherosclerotic disease or aneurysm identified. Catheter angiogram is suggested. No significant carotid or vertebral artery stenosis in the neck. I reviewed the images with Dr. Tasia Catchings at  approximately 1210 hours on 11/06/2015. Electronically Signed   By: Franchot Gallo M.D.   On: 11/06/2015 12:40  Ct Angio Neck W Or Wo Contrast  Result Date: 11/06/2015 CLINICAL DATA:  Sudden onset of headache and slurred speech. Code stroke. Abnormal CT head. History diabetes and hypertension. EXAM: CT ANGIOGRAPHY HEAD AND NECK TECHNIQUE: Multidetector CT imaging of the head and neck was performed using the standard protocol during bolus administration of intravenous contrast. Multiplanar CT image reconstructions and MIPs were obtained to evaluate the vascular anatomy. Carotid stenosis measurements (when applicable) are obtained utilizing NASCET criteria, using the distal internal carotid diameter as the denominator. CONTRAST:  50 mL Isovue 370 IV COMPARISON:  CT head 11/06/2015 FINDINGS: CTA NECK Aortic arch: Normal aortic arch. Proximal great vessels widely patent without stenosis. Lung apices clear. Right carotid system: Right carotid artery is widely patent without stenosis. No atherosclerotic disease or dissection. Vascular tortuosity noted. Left carotid system: Left carotid artery widely patent without stenosis. No dissection or atherosclerotic disease. Vascular tortuosity noted. Vertebral arteries:Both vertebral arteries are patent to the basilar without stenosis or dissection. Skeleton: No acute skeletal abnormality. Minimal degenerative change in the cervical spine. Other neck: Negative for mass or adenopathy in the neck. CTA HEAD Anterior circulation: Left cavernous carotid widely patent. There is early filling of the left cavernous sinus which is asymmetric. No significant contrast in the right cavernous sinus. In addition, the superior ophthalmic vein has dense enhancement and is distended. Dense contrast also in the veins of the superior orbit and left face. Findings compatible with cavernous carotid fistula. No significant atherosclerotic disease in the cavernous carotid. No definite aneurysm  identified. Left cavernous carotid widely patent. Right anterior and middle cerebral arteries widely patent. Left anterior middle cerebral arteries widely patent without stenosis. Posterior circulation: Both vertebral arteries patent to the basilar. PICA, superior cerebellar, posterior cerebral arteries patent. Fetal origin left posterior cerebral artery with hypoplastic left P1 segment. 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 This may be a spontaneous fistula. No underlying atherosclerotic disease or aneurysm identified. Catheter angiogram is suggested. No significant carotid or vertebral artery stenosis in the neck. I reviewed the images with Dr. Tasia Catchings at approximately 1210 hours on 11/06/2015. Electronically Signed   By: Franchot Gallo M.D.   On: 11/06/2015 12:40  Ct Head Code Stroke Wo Contrast`  Addendum Date: 11/06/2015   ADDENDUM REPORT: 11/06/2015 11:57 ADDENDUM: Critical Value/emergent results were called by telephone at the time of interpretation on 11/06/2015 at 11:56 am to Dr. Elson Clan, who verbally acknowledged these results. Electronically Signed   By: Lowella Grip III M.D.   On: 11/06/2015 11:57  Result Date: 11/06/2015 CLINICAL DATA:  Acute onset headache with slurred speech and sensory deficits EXAM: CT HEAD WITHOUT CONTRAST TECHNIQUE: Contiguous axial images were obtained from the base of the skull through the vertex without intravenous contrast. COMPARISON:  None. FINDINGS: The ventricles are normal in size and configuration. There is no intracranial mass, hemorrhage, extra-axial fluid collection, or midline shift. Gray-white compartments appear normal. No acute infarct is evident currently. There is a focal area of hyperdensity in the distal most aspect of the left internal carotid artery at the junction with the origin of the left middle cerebral artery. No other  hyperdensity seen. Vascular calcification is evident. The bony calvarium appears intact. Mastoid air cells are clear. Visualized paranasal sinuses are normal. Orbits appear symmetric bilaterally. A metallic foreign body is noted adjacent to the right orbit laterally, a cosmetic ring. IMPRESSION: Focal hyperdensity in the distal most aspect of the left internal carotid artery extending into the origin of the left middle cerebral artery. This finding is best appreciated on axial slices 11 and 12, series 2. This finding raises concern for localized vessel thrombus with earliest changes of infarct on the left. No gray-white compartment lesions are currently seen. No hemorrhage. Study otherwise unremarkable. Electronically Signed: By: Lowella Grip III M.D. On: 11/06/2015 11:54   Assessment/Plan:  Erlene Quan is a pleasant 55 year old patient who presents today with headache and some minimal slurring of speech. Vaeda reports no prior history of migraine or other headaches. She has no evidence of proptosis or chemosis. Her neurological exam is otherwise normal with no focal neurological deficits and no evidence of cerebellar dysfunction.  Her CT revealed evidence of a focal hyperintensity in the distal most aspect of the left internal carotid artery extending into the origin of the left middle cerebral artery. 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 There is no clear evidence of an aneurysm.  These findings have been discussed with our stroke and neuro interventional radiology team. Given that Memoree appears stable at this time she will be admitted to the stroke service. The plan is for an angiogram tomorrow morning to further evaluate this finding. We will also be requesting MRI and MRA for further evaluation of the relatively mild slurring of speech that she had been experiencing. This finding is actually resolved at this  point.  Plan:  1. MRI and MRA are to be completed.  2. Interventional neuroradiology plans cerebral angiogram tomorrow.  3. Close neurological evaluation. Also, follow closely for any ocular changes.    Lilienne Weins A. Tasia Catchings, M.D. Neurohospitalist Phone: [phone]  11/06/2015, 12:48 PM

## 2015-11-07 ENCOUNTER — Observation Stay (HOSPITAL_COMMUNITY): Payer: Commercial Managed Care - HMO

## 2015-11-07 ENCOUNTER — Ambulatory Visit: Payer: Commercial Managed Care - HMO | Admitting: Internal Medicine

## 2015-11-07 DIAGNOSIS — R4781 Slurred speech: Secondary | ICD-10-CM

## 2015-11-07 DIAGNOSIS — I10 Essential (primary) hypertension: Secondary | ICD-10-CM | POA: Diagnosis not present

## 2015-11-07 DIAGNOSIS — I5032 Chronic diastolic (congestive) heart failure: Secondary | ICD-10-CM

## 2015-11-07 DIAGNOSIS — I25119 Atherosclerotic heart disease of native coronary artery with unspecified angina pectoris: Secondary | ICD-10-CM

## 2015-11-07 DIAGNOSIS — G6289 Other specified polyneuropathies: Secondary | ICD-10-CM

## 2015-11-07 DIAGNOSIS — I770 Arteriovenous fistula, acquired: Secondary | ICD-10-CM | POA: Diagnosis not present

## 2015-11-07 DIAGNOSIS — R4701 Aphasia: Secondary | ICD-10-CM | POA: Diagnosis not present

## 2015-11-07 DIAGNOSIS — R51 Headache: Secondary | ICD-10-CM | POA: Diagnosis not present

## 2015-11-07 HISTORY — PX: IR GENERIC HISTORICAL: IMG1180011

## 2015-11-07 LAB — COMPREHENSIVE METABOLIC PANEL
ALT: 26 U/L (ref 14–54)
AST: 27 U/L (ref 15–41)
Albumin: 3.1 g/dL — ABNORMAL LOW (ref 3.5–5.0)
Alkaline Phosphatase: 102 U/L (ref 38–126)
Anion gap: 7 (ref 5–15)
BUN: 10 mg/dL (ref 6–20)
CO2: 26 mmol/L (ref 22–32)
Calcium: 8.7 mg/dL — ABNORMAL LOW (ref 8.9–10.3)
Chloride: 102 mmol/L (ref 101–111)
Creatinine, Ser: 0.74 mg/dL (ref 0.44–1.00)
GFR calc Af Amer: >60 mL/min (ref >60)
GFR calc non Af Amer: >60 mL/min (ref >60)
Glucose, Bld: 189 mg/dL — ABNORMAL HIGH (ref 65–99)
Potassium: 3.8 mmol/L (ref 3.5–5.1)
Sodium: 135 mmol/L (ref 135–145)
Total Bilirubin: 0.2 mg/dL — ABNORMAL LOW (ref 0.3–1.2)
Total Protein: 6.5 g/dL (ref 6.5–8.1)

## 2015-11-07 LAB — CBC
HCT: 43.4 % (ref 36.0–46.0)
Hemoglobin: 14 g/dL (ref 12.0–15.0)
MCH: 27.7 pg (ref 26.0–34.0)
MCHC: 32.3 g/dL (ref 30.0–36.0)
MCV: 85.8 fL (ref 78.0–100.0)
Platelets: 298 10*3/uL (ref 150–400)
RBC: 5.06 MIL/uL (ref 3.87–5.11)
RDW: 14.6 % (ref 11.5–15.5)
WBC: 8.7 10*3/uL (ref 4.0–10.5)

## 2015-11-07 LAB — LIPID PANEL
Cholesterol: 183 mg/dL (ref 0–200)
HDL: 52 mg/dL (ref >40)
LDL Cholesterol: 98 mg/dL (ref 0–99)
Total CHOL/HDL Ratio: 3.5 RATIO
Triglycerides: 165 mg/dL — ABNORMAL HIGH (ref <150)
VLDL: 33 mg/dL (ref 0–40)

## 2015-11-07 LAB — PROTIME-INR
INR: 1.06
Prothrombin Time: 13.9 seconds (ref 11.4–15.2)

## 2015-11-07 LAB — APTT: aPTT: 34 seconds (ref 24–36)

## 2015-11-07 MED ORDER — LIDOCAINE HCL 1 % IJ SOLN
INTRAMUSCULAR | Status: AC
  Filled 2015-11-07: qty 20

## 2015-11-07 MED ORDER — FENTANYL CITRATE (PF) 100 MCG/2ML IJ SOLN
INTRAMUSCULAR | Status: AC
  Filled 2015-11-07: qty 2

## 2015-11-07 MED ORDER — HEPARIN SOD (PORK) LOCK FLUSH 100 UNIT/ML IV SOLN
INTRAVENOUS | Status: AC
  Filled 2015-11-07: qty 10

## 2015-11-07 MED ORDER — FENTANYL CITRATE (PF) 100 MCG/2ML IJ SOLN
INTRAMUSCULAR | Status: AC | PRN
  Administered 2015-11-07 (×5): 25 ug via INTRAVENOUS

## 2015-11-07 MED ORDER — IOPAMIDOL (ISOVUE-300) INJECTION 61%
INTRAVENOUS | Status: AC
  Administered 2015-11-07: 75 mL
  Filled 2015-11-07: qty 150

## 2015-11-07 MED ORDER — MIDAZOLAM HCL 2 MG/2ML IJ SOLN
INTRAMUSCULAR | Status: AC
  Filled 2015-11-07: qty 2

## 2015-11-07 MED ORDER — LIDOCAINE HCL 1 % IJ SOLN
INTRAMUSCULAR | Status: AC | PRN
  Administered 2015-11-07: 15 mL

## 2015-11-07 MED ORDER — HEPARIN SODIUM (PORCINE) 1000 UNIT/ML IJ SOLN
INTRAMUSCULAR | Status: AC | PRN
  Administered 2015-11-07: 1000 [IU] via INTRAVENOUS
  Administered 2015-11-07: 500 [IU] via INTRAVENOUS

## 2015-11-07 MED ORDER — MIDAZOLAM HCL 2 MG/2ML IJ SOLN
INTRAMUSCULAR | Status: AC | PRN
  Administered 2015-11-07 (×2): 1 mg via INTRAVENOUS

## 2015-11-07 MED ORDER — IOPAMIDOL (ISOVUE-300) INJECTION 61%
INTRAVENOUS | Status: AC
  Administered 2015-11-07: 25 mL
  Filled 2015-11-07: qty 50

## 2015-11-07 MED ORDER — SODIUM CHLORIDE 0.9 % IV SOLN
INTRAVENOUS | Status: AC | PRN
  Administered 2015-11-07: 75 mL/h via INTRAVENOUS

## 2015-11-07 MED ORDER — SODIUM CHLORIDE 0.9 % IV SOLN
INTRAVENOUS | Status: AC
Start: 2015-11-07 — End: 2015-11-07
  Administered 2015-11-07: 16:00:00 via INTRAVENOUS

## 2015-11-07 NOTE — Sedation Documentation (Signed)
Done holding pressure, dressing applied

## 2015-11-07 NOTE — Sedation Documentation (Signed)
Patient is resting comfortably. No more c/o leg pain

## 2015-11-07 NOTE — Sedation Documentation (Signed)
Patient is resting comfortably. 

## 2015-11-07 NOTE — Progress Notes (Signed)
PROGRESS NOTE    Tammy Strickland  [id number] DOB: 19-Dec-1960 DOA: 11/06/2015 PCP: Binnie Rail, MD (Confirm with patient/family/NH records and if not entered, this HAS to be entered at [facility] point of entry. "No PCP" if truly none.)   Brief Narrative: Tammy Strickland is a 55 y.o. female with medical history significant for  Anxiety,arthritis, COPD, CAD, depression, DM, GERD, hypertension, neuromuscular disorder,presenting with severe headaches in a patient with no prior history of migraine. The headache is frontal, without any other neurological complaints, or evidence of facial asymmetry. She denies any blurred vision or double vision. She was taken urgently to the CT scan, which revealed possible dense left MCA sign . CT on June was requested, which findings are suggestive of left carotid cavernous fistula. Stroke team was called, who recommended cerebral angiogram, which is planned for tomorrow.  Neuro to continue to consult.  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 There is also 50-75% stenosis of the distal V4 segment in the right vertebral. Patient underwent cerebral angiogram by IR which he did not show any shunt and venous flow within normal limits. Assessment & Plan:   Active Problems:   Benign essential HTN   Peripheral neuropathy (HCC)   Arthritis   CAD (coronary artery disease)   COPD (chronic obstructive pulmonary disease) (HCC)   GERD (gastroesophageal reflux disease)   Insomnia   Carotid-cavernous fistula (HCC)   Slurred speech   Chronic diastolic congestive heart failure (HCC)  ## TIA -MRI negative for any stroke -Concern about carotid cavernous fistula -Underwent CT angiogram did not show any venous outflow -Clinically feeling well  ##Hypertension On clonidine, Lasix, losartan, metoprolol Currently well controlled However patient is on both clonidine and metoprolol which could cause bradycardia Will  need to be adjusted by the primary care physician  ##Diabetes mellitus Hemoglobin A1c of 8.3 Poorly controlled Start the patient on metformin Continue the sliding scale insulin for now  ##Continued tobacco use Counseling done Keep the patient on nicotine patch develops anxiety  ##Obesity morbid with a BMI of 47 Will need further counseling regarding diet and exercise as patient is currently drowsy came back from the procedure  ##Peripheral neuropathy Amitriptyline, gabapentin  ##Congestive heart failure chronic diastolic Continue the Lasix, losartan, metoprolol     DVT prophylaxis: SCD Code Status: Full Family Communication: Patient  Disposition Plan: Plan for tomorrow   Consultants:  Neurology  Interventional radiology  Procedures:  Echocardiogram: Poor study from 11/01/2015 with EF 60%. No valvular abnormalities CT angiogram by IR: No radiographic evidence of AV shunting to suggest CSF. Venous flow within normal limits   Subjective: Denies any headache now  Objective: Vitals:   11/07/15 1736 11/07/15 1801 11/07/15 1832 11/07/15 1902  BP: 106/75 90/71 (!) 125/97 (!) 101/57  Pulse: 68 (!) 58 65 (!) 59  Resp: 18 20 20 20   Temp:      TempSrc:      SpO2: 97% 97% 97% 100%  Weight:      Height:       No intake or output data in the 24 hours ending 11/07/15 2000 Filed Weights   11/06/15 1206  Weight: (!) 138.8 kg (306 lb)    Examination:  General exam: Appears calm and comfortable  Respiratory system: Clear to auscultation. Respiratory effort normal. Cardiovascular system: S1 & S2 heard, RRR. No JVD, murmurs, rubs, gallops or clicks. No pedal edema. Gastrointestinal system: Abdomen is nondistended, soft and nontender.  No organomegaly or masses felt. Normal bowel sounds heard. Central nervous system:Somnolent and oriented X3 . No focal neurological deficits. Extremities: Symmetric 5 x 5 power. Skin: No rashes, lesions or ulcers Psychiatry: Judgement and  insight appear normal. Mood & affect appropriate.     Data Reviewed: I have personally reviewed following labs and imaging studies  CBC:  Recent Labs Lab 11/01/15 0156 11/06/15 1122 11/06/15 1137 11/07/15 0350  WBC 13.4* 8.1  --  8.7  NEUTROABS  --  3.5  --   --   HGB 14.7 14.0 14.6 14.0  HCT 44.5 43.3 43.0 43.4  MCV 84.8 85.7  --  85.8  PLT 247 296  --  [id number]   Basic Metabolic Panel:  Recent Labs Lab 11/06/15 1122 11/06/15 1137 11/07/15 0350  NA 139 141 135  K 4.4 4.1 3.8  CL 106 101 102  CO2 27  --  26  GLUCOSE 187* 189* 189*  BUN 10 11 10   CREATININE 0.82 0.70 0.74  CALCIUM 9.0  --  8.7*   GFR: Estimated Creatinine Clearance: 119.7 mL/min (by C-G formula based on SCr of 0.8 mg/dL). Liver Function Tests:  Recent Labs Lab 11/06/15 1122 11/07/15 0350  AST 27 27  ALT 27 26  ALKPHOS 110 102  BILITOT 0.2* 0.2*  PROT 6.9 6.5  ALBUMIN 3.3* 3.1*   No results for input(s): LIPASE, AMYLASE in the last 168 hours. No results for input(s): AMMONIA in the last 168 hours. Coagulation Profile:  Recent Labs Lab 11/06/15 1122 11/07/15 0350  INR 1.05 1.06   Cardiac Enzymes:  Recent Labs Lab 10/31/15 2044 11/01/15 0156  TROPONINI <0.03 <0.03   BNP (last 3 results) No results for input(s): PROBNP in the last 8760 hours. HbA1C: No results for input(s): HGBA1C in the last 72 hours. CBG:  Recent Labs Lab 11/06/15 1117 11/06/15 1209  GLUCAP 192* 147*   Lipid Profile:  Recent Labs  11/07/15 0350  CHOL 183  HDL 52  LDLCALC 98  TRIG 165*  CHOLHDL 3.5   Thyroid Function Tests: No results for input(s): TSH, T4TOTAL, FREET4, T3FREE, THYROIDAB in the last 72 hours. Anemia Panel: No results for input(s): VITAMINB12, FOLATE, FERRITIN, TIBC, IRON, RETICCTPCT in the last 72 hours. Sepsis Labs: No results for input(s): PROCALCITON, LATICACIDVEN in the last 168 hours.  No results found for this or any previous visit (from the past 240 hour(s)).        Radiology Studies: Ct Angio Head W Or Wo Contrast  Result Date: 11/06/2015 CLINICAL DATA:  Sudden onset of headache and slurred speech. Code stroke. Abnormal CT head. History diabetes and hypertension. EXAM: CT ANGIOGRAPHY HEAD AND NECK TECHNIQUE: Multidetector CT imaging of the head and neck was performed using the standard protocol during bolus administration of intravenous contrast. Multiplanar CT image reconstructions and MIPs were obtained to evaluate the vascular anatomy. Carotid stenosis measurements (when applicable) are obtained utilizing NASCET criteria, using the distal internal carotid diameter as the denominator. CONTRAST:  50 mL Isovue 370 IV COMPARISON:  CT head 11/06/2015 FINDINGS: CTA NECK Aortic arch: Normal aortic arch. Proximal great vessels widely patent without stenosis. Lung apices clear. Right carotid system: Right carotid artery is widely patent without stenosis. No atherosclerotic disease or dissection. Vascular tortuosity noted. Left carotid system: Left carotid artery widely patent without stenosis. No dissection or atherosclerotic disease. Vascular tortuosity noted. Vertebral arteries:Both vertebral arteries are patent to the basilar without stenosis or dissection. Skeleton: No acute skeletal abnormality. Minimal degenerative change in  the cervical spine. Other neck: Negative for mass or adenopathy in the neck. CTA HEAD Anterior circulation: Left cavernous carotid widely patent. There is early filling of the left cavernous sinus which is asymmetric. No significant contrast in the right cavernous sinus. In addition, the superior ophthalmic vein has dense enhancement and is distended. Dense contrast also in the veins of the superior orbit and left face. Findings compatible with cavernous carotid fistula. No significant atherosclerotic disease in the cavernous carotid. No definite aneurysm identified. Left cavernous carotid widely patent. Right anterior and middle cerebral  arteries widely patent. Left anterior middle cerebral arteries widely patent without stenosis. Posterior circulation: Both vertebral arteries patent to the basilar. PICA, superior cerebellar, posterior cerebral arteries patent. Fetal origin left posterior cerebral artery with hypoplastic left P1 segment. 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 This may be a spontaneous fistula. No underlying atherosclerotic disease or aneurysm identified. Catheter angiogram is suggested. No significant carotid or vertebral artery stenosis in the neck. I reviewed the images with Dr. Tasia Catchings at approximately 1210 hours on 11/06/2015. Electronically Signed   By: Franchot Gallo M.D.   On: 11/06/2015 12:40  Ct Angio Neck W Or Wo Contrast  Result Date: 11/06/2015 CLINICAL DATA:  Sudden onset of headache and slurred speech. Code stroke. Abnormal CT head. History diabetes and hypertension. EXAM: CT ANGIOGRAPHY HEAD AND NECK TECHNIQUE: Multidetector CT imaging of the head and neck was performed using the standard protocol during bolus administration of intravenous contrast. Multiplanar CT image reconstructions and MIPs were obtained to evaluate the vascular anatomy. Carotid stenosis measurements (when applicable) are obtained utilizing NASCET criteria, using the distal internal carotid diameter as the denominator. CONTRAST:  50 mL Isovue 370 IV COMPARISON:  CT head 11/06/2015 FINDINGS: CTA NECK Aortic arch: Normal aortic arch. Proximal great vessels widely patent without stenosis. Lung apices clear. Right carotid system: Right carotid artery is widely patent without stenosis. No atherosclerotic disease or dissection. Vascular tortuosity noted. Left carotid system: Left carotid artery widely patent without stenosis. No dissection or atherosclerotic disease. Vascular tortuosity noted. Vertebral arteries:Both vertebral arteries  are patent to the basilar without stenosis or dissection. Skeleton: No acute skeletal abnormality. Minimal degenerative change in the cervical spine. Other neck: Negative for mass or adenopathy in the neck. CTA HEAD Anterior circulation: Left cavernous carotid widely patent. There is early filling of the left cavernous sinus which is asymmetric. No significant contrast in the right cavernous sinus. In addition, the superior ophthalmic vein has dense enhancement and is distended. Dense contrast also in the veins of the superior orbit and left face. Findings compatible with cavernous carotid fistula. No significant atherosclerotic disease in the cavernous carotid. No definite aneurysm identified. Left cavernous carotid widely patent. Right anterior and middle cerebral arteries widely patent. Left anterior middle cerebral arteries widely patent without stenosis. Posterior circulation: Both vertebral arteries patent to the basilar. PICA, superior cerebellar, posterior cerebral arteries patent. Fetal origin left posterior cerebral artery with hypoplastic left P1 segment. 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 This may be a spontaneous fistula. No underlying atherosclerotic disease or aneurysm identified. Catheter angiogram is suggested. No significant carotid or vertebral artery stenosis in the neck. I reviewed the images with Dr. Tasia Catchings at approximately 1210 hours on 11/06/2015. Electronically Signed   By: Franchot Gallo M.D.   On: 11/06/2015 12:40  Mr Angiogram  Head Wo Contrast  Result Date: 11/06/2015 CLINICAL DATA:  Headache. Slurred speech. Last seen normal, earlier today. EXAM: MRI HEAD WITHOUT CONTRAST MRA HEAD WITHOUT CONTRAST TECHNIQUE: Multiplanar, multiecho pulse sequences of the brain and surrounding structures were obtained without intravenous contrast. Angiographic images of the head were  obtained using MRA technique without contrast. COMPARISON:  None. CTA head neck earlier today. FINDINGS: MRI HEAD FINDINGS No acute stroke, acute hemorrhage, mass lesion, hydrocephalus, or extra-axial fluid. Normal cerebral volume. No white matter disease. Asymmetrically prominent cavernous and supraclinoid ICA on the LEFT. Bulging LEFT cavernous sinus consistent with the CT diagnosis of CC fistula. Prominent supraorbital vein on the LEFT. Mild LEFT proptosis. Arterial flow voids are maintained. Pituitary, pineal, and cerebellar tonsils unremarkable. No upper cervical lesions. Extracranial soft tissues unremarkable. MRA HEAD FINDINGS Both internal carotid arteries are widely patent, with the LEFT larger, particularly in its cavernous and supraclinoid segments. Direct communication to the cavernous sinus is not established on this noncontrast MRA technique. The supraclinoid ICA and ICA terminus are more vertically oriented on the LEFT than RIGHT, likely related to the fistula. No intracranial anterior circulation stenosis or large vessel occlusion. Normal basilar artery. Vertebrals both contribute to formation of basilar, with a estimated 50-75% stenosis of the distal V4 segment on the RIGHT. Fetal origin LEFT PCA. IMPRESSION: No evidence for acute stroke or mass lesion. No cause seen for slurred speech. 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 LEFT ICA larger than the RIGHT related to shunt physiology. Exact site of communication is not established. 50-75% stenosis distal V4 segment, RIGHT vertebral. Electronically Signed   By: Staci Righter M.D.   On: 11/06/2015 15:27  Mr Brain Wo Contrast  Result Date: 11/06/2015 CLINICAL DATA:  Headache. Slurred speech. Last seen normal, earlier today. EXAM: MRI HEAD WITHOUT CONTRAST MRA HEAD WITHOUT CONTRAST TECHNIQUE: Multiplanar, multiecho pulse sequences of the brain and surrounding structures were obtained  without intravenous contrast. Angiographic images of the head were obtained using MRA technique without contrast. COMPARISON:  None. CTA head neck earlier today. FINDINGS: MRI HEAD FINDINGS No acute stroke, acute hemorrhage, mass lesion, hydrocephalus, or extra-axial fluid. Normal cerebral volume. No white matter disease. Asymmetrically prominent cavernous and supraclinoid ICA on the LEFT. Bulging LEFT cavernous sinus consistent with the CT diagnosis of CC fistula. Prominent supraorbital vein on the LEFT. Mild LEFT proptosis. Arterial flow voids are maintained. Pituitary, pineal, and cerebellar tonsils unremarkable. No upper cervical lesions. Extracranial soft tissues unremarkable. MRA HEAD FINDINGS Both internal carotid arteries are widely patent, with the LEFT larger, particularly in its cavernous and supraclinoid segments. Direct communication to the cavernous sinus is not established on this noncontrast MRA technique. The supraclinoid ICA and ICA terminus are more vertically oriented on the LEFT than RIGHT, likely related to the fistula. No intracranial anterior circulation stenosis or large vessel occlusion. Normal basilar artery. Vertebrals both contribute to formation of basilar, with a estimated 50-75% stenosis of the distal V4 segment on the RIGHT. Fetal origin LEFT PCA. IMPRESSION: No evidence for acute stroke or mass lesion. No cause seen for slurred speech. 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 LEFT ICA larger than the RIGHT related to shunt physiology. Exact site of communication is not established. 50-75% stenosis distal V4 segment, RIGHT vertebral. Electronically Signed   By: Staci Righter M.D.   On: 11/06/2015 15:27  Ct Head Code Stroke Wo Contrast`  Addendum Date: 11/06/2015  ADDENDUM REPORT: 11/06/2015 11:57 ADDENDUM: Critical Value/emergent results were called by telephone at the time of interpretation on 11/06/2015 at 11:56 am  to Dr. Elson Clan, who verbally acknowledged these results. Electronically Signed   By: Lowella Grip III M.D.   On: 11/06/2015 11:57  Result Date: 11/06/2015 CLINICAL DATA:  Acute onset headache with slurred speech and sensory deficits EXAM: CT HEAD WITHOUT CONTRAST TECHNIQUE: Contiguous axial images were obtained from the base of the skull through the vertex without intravenous contrast. COMPARISON:  None. FINDINGS: The ventricles are normal in size and configuration. There is no intracranial mass, hemorrhage, extra-axial fluid collection, or midline shift. Gray-white compartments appear normal. No acute infarct is evident currently. There is a focal area of hyperdensity in the distal most aspect of the left internal carotid artery at the junction with the origin of the left middle cerebral artery. No other hyperdensity seen. Vascular calcification is evident. The bony calvarium appears intact. Mastoid air cells are clear. Visualized paranasal sinuses are normal. Orbits appear symmetric bilaterally. A metallic foreign body is noted adjacent to the right orbit laterally, a cosmetic ring. IMPRESSION: Focal hyperdensity in the distal most aspect of the left internal carotid artery extending into the origin of the left middle cerebral artery. This finding is best appreciated on axial slices 11 and 12, series 2. This finding raises concern for localized vessel thrombus with earliest changes of infarct on the left. No gray-white compartment lesions are currently seen. No hemorrhage. Study otherwise unremarkable. Electronically Signed: By: Lowella Grip III M.D. On: 11/06/2015 11:54       Scheduled Meds: . amitriptyline  50 mg Oral QHS  . aspirin EC  81 mg Oral Daily  . cloNIDine  0.3 mg Oral Daily  . fentaNYL      . fentaNYL      . furosemide  80 mg Oral Daily  . gabapentin  800 mg Oral TID  . heparin lock flush      . heparin lock flush      . hydrOXYzine  25 mg Oral TID  . lidocaine       . losartan  50 mg Oral BID  . meloxicam  15 mg Oral Daily  . metoprolol  200 mg Oral Daily  . midazolam      . mirtazapine  30 mg Oral QHS  . pantoprazole  40 mg Oral Daily  . potassium chloride SA  20 mEq Oral Daily  . sodium chloride flush  3 mL Intravenous Q12H  . sodium chloride flush  3 mL Intravenous Q12H  . tiZANidine  8 mg Oral TID  . traZODone  50 mg Oral QHS   Continuous Infusions:    LOS: 0 days    Time spent: 40 minutes  Quintavious Rinck, MD [facility] Pager [phone]  If 7PM-7AM, please contact night-coverage [email or web address] Password [email or web address] 11/07/2015, 8:00 PM

## 2015-11-07 NOTE — Care Management Note (Signed)
Case Management Note  Patient Details  Name: Tammy Strickland MRN: [id number] Date of Birth: 12/30/1960  Subjective/Objective:   Pt in with slurred speech. She is from a shelter.                 Action/Plan: Pt went for cerebral angiogram today. CM met with the patient and she plans on returning to the shelter at discharge. CM following for further d/c needs.  Expected Discharge Date:                  Expected Discharge Plan:  Home/Self Care  In-House Referral:     Discharge planning Services     Post Acute Care Choice:    Choice offered to:     DME Arranged:    DME Agency:     HH Arranged:    HH Agency:     Status of Service:  In process, will continue to follow  If discussed at Long Length of Stay Meetings, dates discussed:    Additional Comments:  Pollie Friar, RN 11/07/2015, 3:17 PM

## 2015-11-07 NOTE — Sedation Documentation (Signed)
Transported to 5C02, back pain easing, pt hungry.  Groin and pulse, orders reviewed with floor RN.  Family and call bell at bedside.  R groin instructions reviewed multiple times with pt.  Verbalizes understanding.

## 2015-11-07 NOTE — Sedation Documentation (Signed)
Right groin sheath pulled by Cheryll Dette RT

## 2015-11-07 NOTE — Procedures (Signed)
4 vessel cerebral arteriogram RT CFA approach. Findings. 1.No angiographic evidence of AV shunting to suggest CSF. 2.Venous outflow WNLs

## 2015-11-07 NOTE — Sedation Documentation (Signed)
States left leg pain better

## 2015-11-07 NOTE — Sedation Documentation (Signed)
Pt c/o left thigh burning and pain.  Able to move left leg, Dr Estanislado Pandy aware

## 2015-11-07 NOTE — Sedation Documentation (Signed)
O2 2l/[place] applied 

## 2015-11-07 NOTE — Care Management Obs Status (Signed)
[place] NOTIFICATION   Patient Details  Name: Tammy Strickland MRN: [id number] Date of Birth: October 14, 1960   Medicare Observation Status Notification Given:  Yes    Pollie Friar, RN 11/07/2015, 1:28 PM

## 2015-11-07 NOTE — Progress Notes (Signed)
Pt arrived to unit alert, verbal with no noted distress. Pt stable,neuro intact.  No complaints of pain or discomfort from site.  Gauze dressing to right groin clean dry and intact. VS within normal range.  Pt lying flat in bed at this time. Call bell within reach. Family at bedside.

## 2015-11-07 NOTE — Sedation Documentation (Signed)
o2 d/c'd 

## 2015-11-07 NOTE — Progress Notes (Signed)
STROKE TEAM PROGRESS NOTE   HISTORY OF PRESENT ILLNESS (per record) Tammy McBrydeis an 55 y.o.female who reports HA and minimal slurring of speech that began around 9 AM 11/06/2015. Tammy Strickland reports that she typically does not have an issue with headaches and has no prior history of migraine. Tammy Strickland describes the headache as being frontal in location she reports no other focal neurological complaints and she has no evidence of facial asymmetry. Ocular evaluation reveals no evidence of proptosis or chemosis. Tammy Strickland was taken urgently to the CT scanner. The CT revealed evidence of a possible dense left MCA sign. A CT angiogram was requested. 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 The stroke team and interventional neuroradiology were notified of these findings. Patient was not administered IV t-PA . She was admitted  for further evaluation and treatment.   SUBJECTIVE (INTERVAL HISTORY) Her son and daughters are daughter is at the bedside.  Overall she feels her condition is stable. She just returned from cerebral catheter angiogram which was negative for carotid cavernous fistula   OBJECTIVE Temp:  [97.7 F (36.5 C)-98.3 F (36.8 C)] 98.2 F (36.8 C) (07/27 0400) Pulse Rate:  [59-67] 60 (07/27 0400) Cardiac Rhythm: Sinus bradycardia (07/27 0751) Resp:  [15-20] 18 (07/27 0400) BP: (97-127)/(60-90) 112/71 (07/27 0400) SpO2:  [94 %-100 %] 94 % (07/27 0400) Weight:  [138.8 kg (306 lb)] 138.8 kg (306 lb) (07/26 1206)  CBC:  Recent Labs Lab 10/31/15 1600  11/06/15 1122 11/06/15 1137 11/07/15 0350  WBC 11.9*  < > 8.1  --  8.7  NEUTROABS 6.6  --  3.5  --   --   HGB 15.2*  < > 14.0 14.6 14.0  HCT 45.3  < > 43.3 43.0 43.4  MCV 83.1  < > 85.7  --  85.8  PLT PLATELET CLUMPS NOTED ON SMEAR, COUNT APPEARS ADEQUATE  < > 296  --  298  < > = values in this interval not displayed.  Basic Metabolic Panel:  Recent Labs Lab 11/06/15 1122 11/06/15 1137  11/07/15 0350  NA 139 141 135  K 4.4 4.1 3.8  CL 106 101 102  CO2 27  --  26  GLUCOSE 187* 189* 189*  BUN 10 11 10   CREATININE 0.82 0.70 0.74  CALCIUM 9.0  --  8.7*    Lipid Panel:    Component Value Date/Time   CHOL 183 11/07/2015 0350   TRIG 165 (H) 11/07/2015 0350   HDL 52 11/07/2015 0350   CHOLHDL 3.5 11/07/2015 0350   VLDL 33 11/07/2015 0350   LDLCALC 98 11/07/2015 0350   HgbA1c:  Lab Results  Component Value Date   HGBA1C 8.3 (H) 11/01/2015   Urine Drug Screen: No results found for: LABOPIA, COCAINSCRNUR, LABBENZ, AMPHETMU, THCU, LABBARB    IMAGING  Ct Angio Head W Or Wo Contrast  Result Date: 11/06/2015 CLINICAL DATA:  Sudden onset of headache and slurred speech. Code stroke. Abnormal CT head. History diabetes and hypertension. EXAM: CT ANGIOGRAPHY HEAD AND NECK TECHNIQUE: Multidetector CT imaging of the head and neck was performed using the standard protocol during bolus administration of intravenous contrast. Multiplanar CT image reconstructions and MIPs were obtained to evaluate the vascular anatomy. Carotid stenosis measurements (when applicable) are obtained utilizing NASCET criteria, using the distal internal carotid diameter as the denominator. CONTRAST:  50 mL Isovue 370 IV COMPARISON:  CT head 11/06/2015 FINDINGS: CTA NECK Aortic arch: Normal aortic arch. Proximal great vessels widely patent without stenosis. Lung  apices clear. Right carotid system: Right carotid artery is widely patent without stenosis. No atherosclerotic disease or dissection. Vascular tortuosity noted. Left carotid system: Left carotid artery widely patent without stenosis. No dissection or atherosclerotic disease. Vascular tortuosity noted. Vertebral arteries:Both vertebral arteries are patent to the basilar without stenosis or dissection. Skeleton: No acute skeletal abnormality. Minimal degenerative change in the cervical spine. Other neck: Negative for mass or adenopathy in the neck. CTA HEAD  Anterior circulation: Left cavernous carotid widely patent. There is early filling of the left cavernous sinus which is asymmetric. No significant contrast in the right cavernous sinus. In addition, the superior ophthalmic vein has dense enhancement and is distended. Dense contrast also in the veins of the superior orbit and left face. Findings compatible with cavernous carotid fistula. No significant atherosclerotic disease in the cavernous carotid. No definite aneurysm identified. Left cavernous carotid widely patent. Right anterior and middle cerebral arteries widely patent. Left anterior middle cerebral arteries widely patent without stenosis. Posterior circulation: Both vertebral arteries patent to the basilar. PICA, superior cerebellar, posterior cerebral arteries patent. Fetal origin left posterior cerebral artery with hypoplastic left P1 segment. 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 This may be a spontaneous fistula. No underlying atherosclerotic disease or aneurysm identified. Catheter angiogram is suggested. No significant carotid or vertebral artery stenosis in the neck. I reviewed the images with Dr. Tasia Catchings at approximately 1210 hours on 11/06/2015. Electronically Signed   By: Franchot Gallo M.D.   On: 11/06/2015 12:40  Ct Angio Neck W Or Wo Contrast  Result Date: 11/06/2015 CLINICAL DATA:  Sudden onset of headache and slurred speech. Code stroke. Abnormal CT head. History diabetes and hypertension. EXAM: CT ANGIOGRAPHY HEAD AND NECK TECHNIQUE: Multidetector CT imaging of the head and neck was performed using the standard protocol during bolus administration of intravenous contrast. Multiplanar CT image reconstructions and MIPs were obtained to evaluate the vascular anatomy. Carotid stenosis measurements (when applicable) are obtained utilizing NASCET criteria, using the distal  internal carotid diameter as the denominator. CONTRAST:  50 mL Isovue 370 IV COMPARISON:  CT head 11/06/2015 FINDINGS: CTA NECK Aortic arch: Normal aortic arch. Proximal great vessels widely patent without stenosis. Lung apices clear. Right carotid system: Right carotid artery is widely patent without stenosis. No atherosclerotic disease or dissection. Vascular tortuosity noted. Left carotid system: Left carotid artery widely patent without stenosis. No dissection or atherosclerotic disease. Vascular tortuosity noted. Vertebral arteries:Both vertebral arteries are patent to the basilar without stenosis or dissection. Skeleton: No acute skeletal abnormality. Minimal degenerative change in the cervical spine. Other neck: Negative for mass or adenopathy in the neck. CTA HEAD Anterior circulation: Left cavernous carotid widely patent. There is early filling of the left cavernous sinus which is asymmetric. No significant contrast in the right cavernous sinus. In addition, the superior ophthalmic vein has dense enhancement and is distended. Dense contrast also in the veins of the superior orbit and left face. Findings compatible with cavernous carotid fistula. No significant atherosclerotic disease in the cavernous carotid. No definite aneurysm identified. Left cavernous carotid widely patent. Right anterior and middle cerebral arteries widely patent. Left anterior middle cerebral arteries widely patent without stenosis. Posterior circulation: Both vertebral arteries patent to the basilar. PICA, superior cerebellar, posterior cerebral arteries patent. Fetal origin left posterior cerebral artery with hypoplastic left P1 segment. Venous sinuses: Patent Anatomic variants: None Delayed phase: Not performed IMPRESSION: Findings consistent with carotid cavernous  fistula on the left. This may be a spontaneous fistula. No underlying atherosclerotic disease or aneurysm identified. Catheter angiogram is suggested. No significant  carotid or vertebral artery stenosis in the neck. I reviewed the images with Dr. Tasia Catchings at approximately 1210 hours on 11/06/2015. Electronically Signed   By: Franchot Gallo M.D.   On: 11/06/2015 12:40  Mr Angiogram Head Wo Contrast  Result Date: 11/06/2015 CLINICAL DATA:  Headache. Slurred speech. Last seen normal, earlier today. EXAM: MRI HEAD WITHOUT CONTRAST MRA HEAD WITHOUT CONTRAST TECHNIQUE: Multiplanar, multiecho pulse sequences of the brain and surrounding structures were obtained without intravenous contrast. Angiographic images of the head were obtained using MRA technique without contrast. COMPARISON:  None. CTA head neck earlier today. FINDINGS: MRI HEAD FINDINGS No acute stroke, acute hemorrhage, mass lesion, hydrocephalus, or extra-axial fluid. Normal cerebral volume. No white matter disease. Asymmetrically prominent cavernous and supraclinoid ICA on the LEFT. Bulging LEFT cavernous sinus consistent with the CT diagnosis of CC fistula. Prominent supraorbital vein on the LEFT. Mild LEFT proptosis. Arterial flow voids are maintained. Pituitary, pineal, and cerebellar tonsils unremarkable. No upper cervical lesions. Extracranial soft tissues unremarkable. MRA HEAD FINDINGS Both internal carotid arteries are widely patent, with the LEFT larger, particularly in its cavernous and supraclinoid segments. Direct communication to the cavernous sinus is not established on this noncontrast MRA technique. The supraclinoid ICA and ICA terminus are more vertically oriented on the LEFT than RIGHT, likely related to the fistula. No intracranial anterior circulation stenosis or large vessel occlusion. Normal basilar artery. Vertebrals both contribute to formation of basilar, with a estimated 50-75% stenosis of the distal V4 segment on the RIGHT. Fetal origin LEFT PCA. IMPRESSION: No evidence for acute stroke or mass lesion. No cause seen for slurred speech. 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 LEFT ICA larger than the RIGHT related to shunt physiology. Exact site of communication is not established. 50-75% stenosis distal V4 segment, RIGHT vertebral. Electronically Signed   By: Staci Righter M.D.   On: 11/06/2015 15:27  Mr Brain Wo Contrast  Result Date: 11/06/2015 CLINICAL DATA:  Headache. Slurred speech. Last seen normal, earlier today. EXAM: MRI HEAD WITHOUT CONTRAST MRA HEAD WITHOUT CONTRAST TECHNIQUE: Multiplanar, multiecho pulse sequences of the brain and surrounding structures were obtained without intravenous contrast. Angiographic images of the head were obtained using MRA technique without contrast. COMPARISON:  None. CTA head neck earlier today. FINDINGS: MRI HEAD FINDINGS No acute stroke, acute hemorrhage, mass lesion, hydrocephalus, or extra-axial fluid. Normal cerebral volume. No white matter disease. Asymmetrically prominent cavernous and supraclinoid ICA on the LEFT. Bulging LEFT cavernous sinus consistent with the CT diagnosis of CC fistula. Prominent supraorbital vein on the LEFT. Mild LEFT proptosis. Arterial flow voids are maintained. Pituitary, pineal, and cerebellar tonsils unremarkable. No upper cervical lesions. Extracranial soft tissues unremarkable. MRA HEAD FINDINGS Both internal carotid arteries are widely patent, with the LEFT larger, particularly in its cavernous and supraclinoid segments. Direct communication to the cavernous sinus is not established on this noncontrast MRA technique. The supraclinoid ICA and ICA terminus are more vertically oriented on the LEFT than RIGHT, likely related to the fistula. No intracranial anterior circulation stenosis or large vessel occlusion. Normal basilar artery. Vertebrals both contribute to formation of basilar, with a estimated 50-75% stenosis of the distal V4 segment on the RIGHT. Fetal origin LEFT PCA. IMPRESSION: No evidence for acute stroke or mass lesion. No cause seen for  slurred speech. Indirect findings  consistent with LEFT carotid cavernous fistula, documented on earlier CTA head neck. LEFT ICA larger than the RIGHT related to shunt physiology. Exact site of communication is not established. 50-75% stenosis distal V4 segment, RIGHT vertebral. Electronically Signed   By: Staci Righter M.D.   On: 11/06/2015 15:27  Ct Head Code Stroke Wo Contrast`  Addendum Date: 11/06/2015   ADDENDUM REPORT: 11/06/2015 11:57 ADDENDUM: Critical Value/emergent results were called by telephone at the time of interpretation on 11/06/2015 at 11:56 am to Dr. Elson Clan, who verbally acknowledged these results. Electronically Signed   By: Lowella Grip III M.D.   On: 11/06/2015 11:57  Result Date: 11/06/2015 CLINICAL DATA:  Acute onset headache with slurred speech and sensory deficits EXAM: CT HEAD WITHOUT CONTRAST TECHNIQUE: Contiguous axial images were obtained from the base of the skull through the vertex without intravenous contrast. COMPARISON:  None. FINDINGS: The ventricles are normal in size and configuration. There is no intracranial mass, hemorrhage, extra-axial fluid collection, or midline shift. Gray-white compartments appear normal. No acute infarct is evident currently. There is a focal area of hyperdensity in the distal most aspect of the left internal carotid artery at the junction with the origin of the left middle cerebral artery. No other hyperdensity seen. Vascular calcification is evident. The bony calvarium appears intact. Mastoid air cells are clear. Visualized paranasal sinuses are normal. Orbits appear symmetric bilaterally. A metallic foreign body is noted adjacent to the right orbit laterally, a cosmetic ring. IMPRESSION: Focal hyperdensity in the distal most aspect of the left internal carotid artery extending into the origin of the left middle cerebral artery. This finding is best appreciated on axial slices 11 and 12, series 2. This finding raises concern for  localized vessel thrombus with earliest changes of infarct on the left. No gray-white compartment lesions are currently seen. No hemorrhage. Study otherwise unremarkable. Electronically Signed: By: Lowella Grip III M.D. On: 11/06/2015 11:54      PHYSICAL EXAM Obese middle aged african american lady not in distress. . Afebrile. Head is nontraumatic. Neck is supple without bruit.    Cardiac exam no murmur or gallop. Lungs are clear to auscultation. Distal pulses are well felt. Flat in bed due to recent cerebral catheter angiogram Neurological Exam ;  Awake  Alert oriented x 3. Normal speech and language.eye movements full without nystagmus.fundi were not visualized. Vision acuity and fields appear normal. Hearing is normal. Palatal movements are normal. Face symmetric. Tongue midline. Normal strength, tone, reflexes and coordination. Right leg strength testing limited due to increasing cerebral catheter angiogram. Normal sensation. Gait deferred.  ASSESSMENT/PLAN Tammy Strickland is a 55 y.o. female with history of anxiety, COPD, CAD, depression, diabetes, GERD, HTN  presenting with HA and minimal slurring of speech. She did not receive IV t-PA.   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 No significant atherosclerosis  MRI  No acute stroke   MRA ?  L cavernous carotid fistula. L ICA larger than R ICA due to shunt. Fetal origin L PCA.R VA 50-75%  2D Echo  pending  LDL 98  HgbA1c 8.3  SCDs for VTE prophylaxis  Diet NPO time specified  aspirin 81 mg daily prior to admission, now on aspirin 81 mg daily   Patient counseled to be compliant with her antithrombotic medications  Ongoing aggressive stroke risk factor management  Therapy recommendations:  pending   Disposition:  Anticipate return home  L Carotid Cavernous Fistula  Cerebral Angio  Negative for CCF  Hypertension  Stable  Long-term BP goal  normotensive  Hyperlipidemia  Home meds:  No statin  LDL 98, goal < 70  Add lipitor 10 mg  Continue statin at discharge  Diabetes  HgbA1c 8.3, goal < 7.0  Uncontrolled  Other Stroke Risk Factors  Cigarette smoker, advised to stop smoking  Morbid Obesity, Body mass index is 47.22 kg/m., recommend weight loss, diet and exercise as appropriate   Family hx stroke (father)  Coronary artery disease  Chronic diastolic CHF  Other Active Problems  COPD  Neuropathy/R Knee pain  Depression/anxiety  Insomnia  GERD  Hospital day # 0  I have personally examined this patient, reviewed notes, independently viewed imaging studies, participated in medical decision making and plan of care. I have made any additions or clarifications directly to the above note. She presented with transient slurred speech and left leg and hand tingling likely due to a right brain subcortical TIA due to small vessel disease. MRI brain is negative for acute infarct. CT angiogram suggested possible left carotid cavernous fistula. Cerebral catheter angiogram is negative. I had a long discussion with the patient and her son and daughter and answered questions. Recommend aspirin for stroke prevention and smoking cessation. Greater than 50% time during this 35 minute visit was spent on counseling and coordination of care about stroke and TIA risk, prevention and treatment Antony Contras, MD Medical Director [facility] Pager: [phone] 11/07/2015 1:13 PM    To contact Stroke Continuity provider, please refer to [email or web address] After hours, contact General Neurology

## 2015-11-08 ENCOUNTER — Telehealth: Payer: Self-pay | Admitting: Internal Medicine

## 2015-11-08 DIAGNOSIS — I10 Essential (primary) hypertension: Secondary | ICD-10-CM | POA: Diagnosis not present

## 2015-11-08 DIAGNOSIS — R4781 Slurred speech: Secondary | ICD-10-CM | POA: Diagnosis not present

## 2015-11-08 DIAGNOSIS — G6289 Other specified polyneuropathies: Secondary | ICD-10-CM | POA: Diagnosis not present

## 2015-11-08 DIAGNOSIS — I770 Arteriovenous fistula, acquired: Secondary | ICD-10-CM

## 2015-11-08 DIAGNOSIS — I5032 Chronic diastolic (congestive) heart failure: Secondary | ICD-10-CM | POA: Diagnosis not present

## 2015-11-08 DIAGNOSIS — I25119 Atherosclerotic heart disease of native coronary artery with unspecified angina pectoris: Secondary | ICD-10-CM | POA: Diagnosis not present

## 2015-11-08 LAB — HEMOGLOBIN A1C
Hgb A1c MFr Bld: 8.2 % — ABNORMAL HIGH (ref 4.8–5.6)
Mean Plasma Glucose: 189 mg/dL

## 2015-11-08 MED ORDER — AMITRIPTYLINE HCL 25 MG PO TABS: ORAL_TABLET | ORAL | 1 refills | Status: DC

## 2015-11-08 MED ORDER — IBUPROFEN 200 MG PO TABS: 800.0000 mg | ORAL_TABLET | Freq: Every day | ORAL | Status: DC | PRN

## 2015-11-08 MED ORDER — SIMVASTATIN 20 MG PO TABS: 20.0000 mg | ORAL_TABLET | Freq: Every day | ORAL | Status: DC

## 2015-11-08 MED ORDER — CLONIDINE HCL 0.3 MG PO TABS: 0.3000 mg | ORAL_TABLET | Freq: Every day | ORAL | 1 refills | Status: DC

## 2015-11-08 MED ORDER — METOPROLOL SUCCINATE ER 200 MG PO TB24: 200.0000 mg | ORAL_TABLET | Freq: Every day | ORAL | 1 refills | Status: DC

## 2015-11-08 MED ORDER — FUROSEMIDE 80 MG PO TABS: 80.0000 mg | ORAL_TABLET | Freq: Every day | ORAL | 1 refills | Status: DC

## 2015-11-08 NOTE — Telephone Encounter (Signed)
Patient is requesting all her medication to be refilled and sent to [facility].

## 2015-11-08 NOTE — Telephone Encounter (Signed)
Spoke with pt, advised that since she is still in the hospital to asked for a prescription to take with her once discharged. RXs sent to mail order

## 2015-11-08 NOTE — Progress Notes (Signed)
Pt discharged at this time taking all personal belongings. IV discontinued, dry dressing applied. Discharge instructions provided with verbal understanding. No noted distress.

## 2015-11-08 NOTE — Discharge Summary (Signed)
Physician Discharge Summary  Tammy Strickland [id number] DOB: 07/25/1960 DOA: 11/06/2015  PCP: Binnie Rail, MD  Admit date: 11/06/2015 Discharge date: 11/08/2015  Time spent: 35 minutes  Recommendations for Outpatient Follow-up:  1. Follow-up with PCP in 2 weeks  Discharge Diagnoses:  Active Problems:   Benign essential HTN   Peripheral neuropathy (HCC)   Arthritis   CAD (coronary artery disease)   COPD (chronic obstructive pulmonary disease) (HCC)   GERD (gastroesophageal reflux disease)   Insomnia   Carotid-cavernous fistula (HCC)   Slurred speech   Chronic diastolic congestive heart failure ([place])   Discharge Condition: Stable  Diet recommendation: Cardiac  Filed Weights   11/06/15 1206  Weight: (!) 138.8 kg (306 lb)    History of present illness And Hospital Course:  Tammy Strickland a 55 y.o.femalewith medical history significant for Anxiety,arthritis, COPD, CAD, depression, DM, GERD, hypertension, neuromuscular disorder,presenting with severe headaches in a patient with no prior history of migraine. The headache is frontal, without any other neurological complaints, or evidence of facial asymmetry. She denies any blurred vision or double vision. She was taken urgently to the CT scan, which revealed possible dense left MCA sign . CT angiogram was requested, which findings are suggestive of left carotid cavernous fistula. Stroke team was called, who recommended cerebral angiogram   MRI of the brain showed left carotid cavernous fistula, left ICA larger than the right related to shunt. There is also 50-75% stenosis of the distal V4 segment in the right vertebral. Patient underwent cerebral angiogram by IR which he did not show any shunt and venous flow within normal limits.  ## TIA -MRI negative for any stroke -Concern about carotid cavernous fistula -Underwent CT angiogram did not show any venous outflow -Clinically feeling well  ##Hypertension On clonidine,  Lasix, losartan, metoprolol Currently well controlled However patient is on both clonidine and metoprolol which could cause bradycardia Will need to be adjusted by the primary care physician  ##Diabetes mellitus Hemoglobin A1c of 8.3 Poorly controlled Start the patient on metformin as outpatient  ##Continued tobacco use Counseling done Keep the patient on nicotine patch develops anxiety  ##Obesity morbid with a BMI of 47 Will need further counseling regarding diet and exercise as patient is currently drowsy came back from the procedure  ##Peripheral neuropathy Amitriptyline, gabapentin  ##Congestive heart failure chronic diastolic Continue the Lasix, losartan, metoprolol  Procedures: Echocardiogram: Poor study from 11/01/2015 with EF 60%. No valvular abnormalities CT angiogram by IR: No radiographic evidence of AV shunting to suggest CSF. Venous flow within normal limits Consultations: Neurology  Interventional radiology Discharge Exam: Vitals:   11/08/15 0800 11/08/15 1056  BP: 133/80 92/81  Pulse: 62 63  Resp: 18 19  Temp: 98.3 F (36.8 C) 98.3 F (36.8 C)    General: Well conscious oriented  Cardiovascular: S1-S2 regular Respiratory: Bilateral clear to auscultation  Discharge Instructions    Current Discharge Medication List    CONTINUE these medications which have NOT CHANGED   Details  amitriptyline (ELAVIL) 25 MG tablet Take 25 mg at night, increase to 50 mg at night after one week Qty: 180 tablet, Refills: 1    aspirin EC 81 MG tablet Take 1 tablet (81 mg total) by mouth daily.    cloNIDine (CATAPRES) 0.3 MG tablet Take 1 tablet (0.3 mg total) by mouth daily. Reported on 08/15/2015 Qty: 90 tablet, Refills: 1   Associated Diagnoses: Benign essential HTN    fluticasone (FLONASE) 50 MCG/ACT nasal spray Place 1 spray into  both nostrils daily as needed for allergies.     furosemide (LASIX) 80 MG tablet Take 1 tablet (80 mg total) by mouth  daily. Qty: 90 tablet, Refills: 1    gabapentin (NEURONTIN) 800 MG tablet Take 1 tablet (800 mg total) by mouth 3 (three) times daily. 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 Reported on 08/15/2015    Ipratropium-Albuterol (COMBIVENT) 20-100 MCG/ACT AERS respimat Inhale 1 puff into the lungs every 6 (six) hours.    losartan (COZAAR) 50 MG tablet Take 1 tablet (50 mg total) by mouth 2 (two) times daily. Qty: 180 tablet, Refills: 0    meloxicam (MOBIC) 15 MG tablet Take 1 tablet (15 mg total) by mouth daily. Qty: 90 tablet, Refills: 0    metoprolol (TOPROL-XL) 200 MG 24 hr tablet Take 1 tablet (200 mg total) by mouth daily. Qty: 90 tablet, Refills: 0    mirtazapine (REMERON) 30 MG tablet Take 30 mg by mouth at bedtime.    nitroGLYCERIN (NITROSTAT) 0.4 MG SL tablet Place 0.4 mg under the tongue every 5 (five) minutes as needed for chest pain.    omeprazole (PRILOSEC) 20 MG capsule Take 1 capsule (20 mg total) by mouth daily. Qty: 90 capsule, Refills: 0    potassium chloride SA (K-DUR,KLOR-CON) 20 MEQ tablet Take 1 tablet (20 mEq total) by mouth daily. Reported on 08/15/2015 Qty: 90 tablet, Refills: 0   Associated Diagnoses: Benign essential HTN    tiZANidine (ZANAFLEX) 4 MG tablet Take 2.5 tablets (10 mg total) by mouth 2 (two) times daily as needed. Qty: 450 tablet, Refills: 0    traZODone (DESYREL) 50 MG tablet Take 1 tablet (50 mg total) by mouth at bedtime. Qty: 90 tablet, Refills: 0      STOP taking these medications     nitroGLYCERIN (NITRODUR - DOSED IN MG/24 HR) 0.4 mg/hr patch        No Known Allergies Follow-up Information    Binnie Rail, MD Follow up in 2 week(s).   Specialty:  Internal Medicine Contact information: [place] [address] [phone]            The results of significant diagnostics  from this hospitalization (including imaging, microbiology, ancillary and laboratory) are listed below for reference.    Significant Diagnostic Studies: Ct Angio Head W Or Wo Contrast  Result Date: 11/06/2015 CLINICAL DATA:  Sudden onset of headache and slurred speech. Code stroke. Abnormal CT head. History diabetes and hypertension. EXAM: CT ANGIOGRAPHY HEAD AND NECK TECHNIQUE: Multidetector CT imaging of the head and neck was performed using the standard protocol during bolus administration of intravenous contrast. Multiplanar CT image reconstructions and MIPs were obtained to evaluate the vascular anatomy. Carotid stenosis measurements (when applicable) are obtained utilizing NASCET criteria, using the distal internal carotid diameter as the denominator. CONTRAST:  50 mL Isovue 370 IV COMPARISON:  CT head 11/06/2015 FINDINGS: CTA NECK Aortic arch: Normal aortic arch. Proximal great vessels widely patent without stenosis. Lung apices clear. Right carotid system: Right carotid artery is widely patent without stenosis. No atherosclerotic disease or dissection. Vascular tortuosity noted. Left carotid system: Left carotid artery widely patent without stenosis. No dissection or atherosclerotic disease. Vascular tortuosity noted. Vertebral arteries:Both vertebral arteries are patent to the basilar without stenosis or dissection. Skeleton: No acute skeletal abnormality. Minimal degenerative change in the  cervical spine. Other neck: Negative for mass or adenopathy in the neck. CTA HEAD Anterior circulation: Left cavernous carotid widely patent. There is early filling of the left cavernous sinus which is asymmetric. No significant contrast in the right cavernous sinus. In addition, the superior ophthalmic vein has dense enhancement and is distended. Dense contrast also in the veins of the superior orbit and left face. Findings compatible with cavernous carotid fistula. No significant atherosclerotic disease in the  cavernous carotid. No definite aneurysm identified. Left cavernous carotid widely patent. Right anterior and middle cerebral arteries widely patent. Left anterior middle cerebral arteries widely patent without stenosis. Posterior circulation: Both vertebral arteries patent to the basilar. PICA, superior cerebellar, posterior cerebral arteries patent. Fetal origin left posterior cerebral artery with hypoplastic left P1 segment. 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 This may be a spontaneous fistula. No underlying atherosclerotic disease or aneurysm identified. Catheter angiogram is suggested. No significant carotid or vertebral artery stenosis in the neck. I reviewed the images with Dr. Tasia Catchings at approximately 1210 hours on 11/06/2015. Electronically Signed   By: Franchot Gallo M.D.   On: 11/06/2015 12:40  Dg Chest 2 View  Result Date: 10/31/2015 CLINICAL DATA:  Chest pain since noon today. EXAM: CHEST  2 VIEW COMPARISON:  None. FINDINGS: The cardiac silhouette, mediastinal and hilar contours are within normal limits. There is mild tortuosity of the thoracic aorta. The lungs are clear. No pleural effusions. The bony thorax is intact. IMPRESSION: No acute cardiopulmonary findings. Electronically Signed   By: Marijo Sanes M.D.   On: 10/31/2015 15:36   Ct Angio Neck W Or Wo Contrast  Result Date: 11/06/2015 CLINICAL DATA:  Sudden onset of headache and slurred speech. Code stroke. Abnormal CT head. History diabetes and hypertension. EXAM: CT ANGIOGRAPHY HEAD AND NECK TECHNIQUE: Multidetector CT imaging of the head and neck was performed using the standard protocol during bolus administration of intravenous contrast. Multiplanar CT image reconstructions and MIPs were obtained to evaluate the vascular anatomy. Carotid stenosis measurements (when applicable) are obtained utilizing NASCET criteria,  using the distal internal carotid diameter as the denominator. CONTRAST:  50 mL Isovue 370 IV COMPARISON:  CT head 11/06/2015 FINDINGS: CTA NECK Aortic arch: Normal aortic arch. Proximal great vessels widely patent without stenosis. Lung apices clear. Right carotid system: Right carotid artery is widely patent without stenosis. No atherosclerotic disease or dissection. Vascular tortuosity noted. Left carotid system: Left carotid artery widely patent without stenosis. No dissection or atherosclerotic disease. Vascular tortuosity noted. Vertebral arteries:Both vertebral arteries are patent to the basilar without stenosis or dissection. Skeleton: No acute skeletal abnormality. Minimal degenerative change in the cervical spine. Other neck: Negative for mass or adenopathy in the neck. CTA HEAD Anterior circulation: Left cavernous carotid widely patent. There is early filling of the left cavernous sinus which is asymmetric. No significant contrast in the right cavernous sinus. In addition, the superior ophthalmic vein has dense enhancement and is distended. Dense contrast also in the veins of the superior orbit and left face. Findings compatible with cavernous carotid fistula. No significant atherosclerotic disease in the cavernous carotid. No definite aneurysm identified. Left cavernous carotid widely patent. Right anterior and middle cerebral arteries widely patent. Left anterior middle cerebral arteries widely patent without stenosis. Posterior circulation: Both vertebral arteries patent to the basilar. PICA, superior cerebellar, posterior cerebral arteries patent. Fetal origin left posterior cerebral artery with hypoplastic left P1 segment. Venous  sinuses: Patent Anatomic variants: None Delayed phase: Not performed IMPRESSION: Findings consistent with carotid cavernous fistula on the left. This may be a spontaneous fistula. No underlying atherosclerotic disease or aneurysm identified. Catheter angiogram is suggested.  No significant carotid or vertebral artery stenosis in the neck. I reviewed the images with Dr. Tasia Catchings at approximately 1210 hours on 11/06/2015. Electronically Signed   By: Franchot Gallo M.D.   On: 11/06/2015 12:40  Mr Angiogram Head Wo Contrast  Result Date: 11/06/2015 CLINICAL DATA:  Headache. Slurred speech. Last seen normal, earlier today. EXAM: MRI HEAD WITHOUT CONTRAST MRA HEAD WITHOUT CONTRAST TECHNIQUE: Multiplanar, multiecho pulse sequences of the brain and surrounding structures were obtained without intravenous contrast. Angiographic images of the head were obtained using MRA technique without contrast. COMPARISON:  None. CTA head neck earlier today. FINDINGS: MRI HEAD FINDINGS No acute stroke, acute hemorrhage, mass lesion, hydrocephalus, or extra-axial fluid. Normal cerebral volume. No white matter disease. Asymmetrically prominent cavernous and supraclinoid ICA on the LEFT. Bulging LEFT cavernous sinus consistent with the CT diagnosis of CC fistula. Prominent supraorbital vein on the LEFT. Mild LEFT proptosis. Arterial flow voids are maintained. Pituitary, pineal, and cerebellar tonsils unremarkable. No upper cervical lesions. Extracranial soft tissues unremarkable. MRA HEAD FINDINGS Both internal carotid arteries are widely patent, with the LEFT larger, particularly in its cavernous and supraclinoid segments. Direct communication to the cavernous sinus is not established on this noncontrast MRA technique. The supraclinoid ICA and ICA terminus are more vertically oriented on the LEFT than RIGHT, likely related to the fistula. No intracranial anterior circulation stenosis or large vessel occlusion. Normal basilar artery. Vertebrals both contribute to formation of basilar, with a estimated 50-75% stenosis of the distal V4 segment on the RIGHT. Fetal origin LEFT PCA. IMPRESSION: No evidence for acute stroke or mass lesion. No cause seen for slurred speech. Indirect findings consistent with LEFT  carotid cavernous fistula, documented on earlier CTA head neck. LEFT ICA larger than the RIGHT related to shunt physiology. Exact site of communication is not established. 50-75% stenosis distal V4 segment, RIGHT vertebral. Electronically Signed   By: Staci Righter M.D.   On: 11/06/2015 15:27  Mr Brain Wo Contrast  Result Date: 11/06/2015 CLINICAL DATA:  Headache. Slurred speech. Last seen normal, earlier today. EXAM: MRI HEAD WITHOUT CONTRAST MRA HEAD WITHOUT CONTRAST TECHNIQUE: Multiplanar, multiecho pulse sequences of the brain and surrounding structures were obtained without intravenous contrast. Angiographic images of the head were obtained using MRA technique without contrast. COMPARISON:  None. CTA head neck earlier today. FINDINGS: MRI HEAD FINDINGS No acute stroke, acute hemorrhage, mass lesion, hydrocephalus, or extra-axial fluid. Normal cerebral volume. No white matter disease. Asymmetrically prominent cavernous and supraclinoid ICA on the LEFT. Bulging LEFT cavernous sinus consistent with the CT diagnosis of CC fistula. Prominent supraorbital vein on the LEFT. Mild LEFT proptosis. Arterial flow voids are maintained. Pituitary, pineal, and cerebellar tonsils unremarkable. No upper cervical lesions. Extracranial soft tissues unremarkable. MRA HEAD FINDINGS Both internal carotid arteries are widely patent, with the LEFT larger, particularly in its cavernous and supraclinoid segments. Direct communication to the cavernous sinus is not established on this noncontrast MRA technique. The supraclinoid ICA and ICA terminus are more vertically oriented on the LEFT than RIGHT, likely related to the fistula. No intracranial anterior circulation stenosis or large vessel occlusion. Normal basilar artery. Vertebrals both contribute to formation of basilar, with a estimated 50-75% stenosis of the distal V4 segment on the RIGHT. Fetal origin LEFT PCA. IMPRESSION: No evidence  for acute stroke or mass lesion. No cause  seen for slurred speech. 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 LEFT ICA larger than the RIGHT related to shunt physiology. Exact site of communication is not established. 50-75% stenosis distal V4 segment, RIGHT vertebral. Electronically Signed   By: Staci Righter M.D.   On: 11/06/2015 15:27  Ct Head Code Stroke Wo Contrast`  Addendum Date: 11/06/2015   ADDENDUM REPORT: 11/06/2015 11:57 ADDENDUM: Critical Value/emergent results were called by telephone at the time of interpretation on 11/06/2015 at 11:56 am to Dr. Elson Clan, who verbally acknowledged these results. Electronically Signed   By: Lowella Grip III M.D.   On: 11/06/2015 11:57  Result Date: 11/06/2015 CLINICAL DATA:  Acute onset headache with slurred speech and sensory deficits EXAM: CT HEAD WITHOUT CONTRAST TECHNIQUE: Contiguous axial images were obtained from the base of the skull through the vertex without intravenous contrast. COMPARISON:  None. FINDINGS: The ventricles are normal in size and configuration. There is no intracranial mass, hemorrhage, extra-axial fluid collection, or midline shift. Gray-white compartments appear normal. No acute infarct is evident currently. There is a focal area of hyperdensity in the distal most aspect of the left internal carotid artery at the junction with the origin of the left middle cerebral artery. No other hyperdensity seen. Vascular calcification is evident. The bony calvarium appears intact. Mastoid air cells are clear. Visualized paranasal sinuses are normal. Orbits appear symmetric bilaterally. A metallic foreign body is noted adjacent to the right orbit laterally, a cosmetic ring. IMPRESSION: Focal hyperdensity in the distal most aspect of the left internal carotid artery extending into the origin of the left middle cerebral artery. This finding is best appreciated on axial slices 11 and 12, series 2. This finding raises  concern for localized vessel thrombus with earliest changes of infarct on the left. No gray-white compartment lesions are currently seen. No hemorrhage. Study otherwise unremarkable. Electronically Signed: By: Lowella Grip III M.D. On: 11/06/2015 11:54   Microbiology: No results found for this or any previous visit (from the past 240 hour(s)).   Labs: Basic Metabolic Panel:  Recent Labs Lab 11/06/15 1122 11/06/15 1137 11/07/15 0350  NA 139 141 135  K 4.4 4.1 3.8  CL 106 101 102  CO2 27  --  26  GLUCOSE 187* 189* 189*  BUN 10 11 10   CREATININE 0.82 0.70 0.74  CALCIUM 9.0  --  8.7*   Liver Function Tests:  Recent Labs Lab 11/06/15 1122 11/07/15 0350  AST 27 27  ALT 27 26  ALKPHOS 110 102  BILITOT 0.2* 0.2*  PROT 6.9 6.5  ALBUMIN 3.3* 3.1*   No results for input(s): LIPASE, AMYLASE in the last 168 hours. No results for input(s): AMMONIA in the last 168 hours. CBC:  Recent Labs Lab 11/06/15 1122 11/06/15 1137 11/07/15 0350  WBC 8.1  --  8.7  NEUTROABS 3.5  --   --   HGB 14.0 14.6 14.0  HCT 43.3 43.0 43.4  MCV 85.7  --  85.8  PLT 296  --  298   Cardiac Enzymes: No results for input(s): CKTOTAL, CKMB, CKMBINDEX, TROPONINI in the last 168 hours. BNP: BNP (last 3 results) No results for input(s): BNP in the last 8760 hours.  ProBNP (last 3 results) No results for input(s): PROBNP in the last 8760 hours.  CBG:  Recent Labs Lab 11/06/15 1117 11/06/15 1209  GLUCAP 192* 147*  SignedMonica Becton MD.  [facility] 11/08/2015, 2:27 PM

## 2015-11-08 NOTE — Care Management Note (Signed)
Case Management Note  Patient Details  Name: Tammy Strickland MRN: [id number] Date of Birth: 1961-02-27  Subjective/Objective:                    Action/Plan: Plan is for patient to discharge back to the shelter today. Pt stated she needs bus pass for transportation back to shelter. CSW made aware and will provide her with bus pass.  Expected Discharge Date:                  Expected Discharge Plan:  Home/Self Care  In-House Referral:     Discharge planning Services     Post Acute Care Choice:    Choice offered to:     DME Arranged:    DME Agency:     HH Arranged:    [place] Agency:     Status of Service:  Completed, signed off  If discussed at [employer] of Stay Meetings, dates discussed:    Additional Comments:  Pollie Friar, RN 11/08/2015, 2:31 PM

## 2015-11-08 NOTE — Progress Notes (Signed)
STROKE TEAM PROGRESS NOTE   HISTORY OF PRESENT ILLNESS (per record) Tammy McBrydeis an 55 y.o.female who reports HA and minimal slurring of speech that began around 9 AM 11/06/2015. Tammy Strickland reports that she typically does not have an issue with headaches and has no prior history of migraine. Tammy Strickland describes the headache as being frontal in location she reports no other focal neurological complaints and she has no evidence of facial asymmetry. Ocular evaluation reveals no evidence of proptosis or chemosis. Tammy Strickland was taken urgently to the CT scanner. The CT revealed evidence of a possible dense left MCA sign. A CT angiogram was requested. 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 The stroke team and interventional neuroradiology were notified of these findings. Patient was not administered IV t-PA . She was admitted  for further evaluation and treatment.   SUBJECTIVE (INTERVAL HISTORY) Her family is not at the bedside.  Overall she feels her condition is stable. She is ready to go home today but needs a ride OBJECTIVE Temp:  [97.4 F (36.3 C)-98.6 F (37 C)] 98.3 F (36.8 C) (07/28 0800) Pulse Rate:  [55-76] 62 (07/28 0800) Cardiac Rhythm: Sinus bradycardia (07/28 0700) Resp:  [14-20] 18 (07/28 0800) BP: (90-144)/(50-97) 133/80 (07/28 0800) SpO2:  [92 %-100 %] 100 % (07/28 0800)  CBC:   Recent Labs Lab 11/06/15 1122 11/06/15 1137 11/07/15 0350  WBC 8.1  --  8.7  NEUTROABS 3.5  --   --   HGB 14.0 14.6 14.0  HCT 43.3 43.0 43.4  MCV 85.7  --  85.8  PLT 296  --  [id number]    Basic Metabolic Panel:   Recent Labs Lab 11/06/15 1122 11/06/15 1137 11/07/15 0350  NA 139 141 135  K 4.4 4.1 3.8  CL 106 101 102  CO2 27  --  26  GLUCOSE 187* 189* 189*  BUN 10 11 10   CREATININE 0.82 0.70 0.74  CALCIUM 9.0  --  8.7*    Lipid Panel:     Component Value Date/Time   CHOL 183 11/07/2015 0350   TRIG 165 (H) 11/07/2015 0350   HDL 52 11/07/2015 0350    CHOLHDL 3.5 11/07/2015 0350   VLDL 33 11/07/2015 0350   LDLCALC 98 11/07/2015 0350   HgbA1c:  Lab Results  Component Value Date   HGBA1C 8.2 (H) 11/07/2015   Urine Drug Screen: No results found for: LABOPIA, COCAINSCRNUR, LABBENZ, AMPHETMU, THCU, LABBARB    IMAGING  Ct Angio Head W Or Wo Contrast  Result Date: 11/06/2015 CLINICAL DATA:  Sudden onset of headache and slurred speech. Code stroke. Abnormal CT head. History diabetes and hypertension. EXAM: CT ANGIOGRAPHY HEAD AND NECK TECHNIQUE: Multidetector CT imaging of the head and neck was performed using the standard protocol during bolus administration of intravenous contrast. Multiplanar CT image reconstructions and MIPs were obtained to evaluate the vascular anatomy. Carotid stenosis measurements (when applicable) are obtained utilizing NASCET criteria, using the distal internal carotid diameter as the denominator. CONTRAST:  50 mL Isovue 370 IV COMPARISON:  CT head 11/06/2015 FINDINGS: CTA NECK Aortic arch: Normal aortic arch. Proximal great vessels widely patent without stenosis. Lung apices clear. Right carotid system: Right carotid artery is widely patent without stenosis. No atherosclerotic disease or dissection. Vascular tortuosity noted. Left carotid system: Left carotid artery widely patent without stenosis. No dissection or atherosclerotic disease. Vascular tortuosity noted. Vertebral arteries:Both vertebral arteries are patent to the basilar without stenosis or dissection. Skeleton: No acute skeletal abnormality. Minimal degenerative  change in the cervical spine. Other neck: Negative for mass or adenopathy in the neck. CTA HEAD Anterior circulation: Left cavernous carotid widely patent. There is early filling of the left cavernous sinus which is asymmetric. No significant contrast in the right cavernous sinus. In addition, the superior ophthalmic vein has dense enhancement and is distended. Dense contrast also in the veins of the  superior orbit and left face. Findings compatible with cavernous carotid fistula. No significant atherosclerotic disease in the cavernous carotid. No definite aneurysm identified. Left cavernous carotid widely patent. Right anterior and middle cerebral arteries widely patent. Left anterior middle cerebral arteries widely patent without stenosis. Posterior circulation: Both vertebral arteries patent to the basilar. PICA, superior cerebellar, posterior cerebral arteries patent. Fetal origin left posterior cerebral artery with hypoplastic left P1 segment. 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 This may be a spontaneous fistula. No underlying atherosclerotic disease or aneurysm identified. Catheter angiogram is suggested. No significant carotid or vertebral artery stenosis in the neck. I reviewed the images with Dr. Tasia Catchings at approximately 1210 hours on 11/06/2015. Electronically Signed   By: Franchot Gallo M.D.   On: 11/06/2015 12:40  Ct Angio Neck W Or Wo Contrast  Result Date: 11/06/2015 CLINICAL DATA:  Sudden onset of headache and slurred speech. Code stroke. Abnormal CT head. History diabetes and hypertension. EXAM: CT ANGIOGRAPHY HEAD AND NECK TECHNIQUE: Multidetector CT imaging of the head and neck was performed using the standard protocol during bolus administration of intravenous contrast. Multiplanar CT image reconstructions and MIPs were obtained to evaluate the vascular anatomy. Carotid stenosis measurements (when applicable) are obtained utilizing NASCET criteria, using the distal internal carotid diameter as the denominator. CONTRAST:  50 mL Isovue 370 IV COMPARISON:  CT head 11/06/2015 FINDINGS: CTA NECK Aortic arch: Normal aortic arch. Proximal great vessels widely patent without stenosis. Lung apices clear. Right carotid system: Right carotid artery is widely patent without stenosis.  No atherosclerotic disease or dissection. Vascular tortuosity noted. Left carotid system: Left carotid artery widely patent without stenosis. No dissection or atherosclerotic disease. Vascular tortuosity noted. Vertebral arteries:Both vertebral arteries are patent to the basilar without stenosis or dissection. Skeleton: No acute skeletal abnormality. Minimal degenerative change in the cervical spine. Other neck: Negative for mass or adenopathy in the neck. CTA HEAD Anterior circulation: Left cavernous carotid widely patent. There is early filling of the left cavernous sinus which is asymmetric. No significant contrast in the right cavernous sinus. In addition, the superior ophthalmic vein has dense enhancement and is distended. Dense contrast also in the veins of the superior orbit and left face. Findings compatible with cavernous carotid fistula. No significant atherosclerotic disease in the cavernous carotid. No definite aneurysm identified. Left cavernous carotid widely patent. Right anterior and middle cerebral arteries widely patent. Left anterior middle cerebral arteries widely patent without stenosis. Posterior circulation: Both vertebral arteries patent to the basilar. PICA, superior cerebellar, posterior cerebral arteries patent. Fetal origin left posterior cerebral artery with hypoplastic left P1 segment. 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 This may be a spontaneous fistula. No underlying atherosclerotic disease or aneurysm identified. Catheter angiogram is suggested. No significant carotid or vertebral artery stenosis in the neck. I reviewed the images with Dr. Tasia Catchings at approximately 1210 hours on 11/06/2015. Electronically Signed   By: Franchot Gallo M.D.   On: 11/06/2015 12:40  Mr Angiogram Head Wo Contrast  Result Date: 11/06/2015 CLINICAL DATA:  Headache. Slurred speech. Last  seen normal, earlier today. EXAM: MRI HEAD WITHOUT CONTRAST MRA HEAD WITHOUT CONTRAST TECHNIQUE: Multiplanar, multiecho pulse sequences of the brain and surrounding structures were obtained without intravenous contrast. Angiographic images of the head were obtained using MRA technique without contrast. COMPARISON:  None. CTA head neck earlier today. FINDINGS: MRI HEAD FINDINGS No acute stroke, acute hemorrhage, mass lesion, hydrocephalus, or extra-axial fluid. Normal cerebral volume. No white matter disease. Asymmetrically prominent cavernous and supraclinoid ICA on the LEFT. Bulging LEFT cavernous sinus consistent with the CT diagnosis of CC fistula. Prominent supraorbital vein on the LEFT. Mild LEFT proptosis. Arterial flow voids are maintained. Pituitary, pineal, and cerebellar tonsils unremarkable. No upper cervical lesions. Extracranial soft tissues unremarkable. MRA HEAD FINDINGS Both internal carotid arteries are widely patent, with the LEFT larger, particularly in its cavernous and supraclinoid segments. Direct communication to the cavernous sinus is not established on this noncontrast MRA technique. The supraclinoid ICA and ICA terminus are more vertically oriented on the LEFT than RIGHT, likely related to the fistula. No intracranial anterior circulation stenosis or large vessel occlusion. Normal basilar artery. Vertebrals both contribute to formation of basilar, with a estimated 50-75% stenosis of the distal V4 segment on the RIGHT. Fetal origin LEFT PCA. IMPRESSION: No evidence for acute stroke or mass lesion. No cause seen for slurred speech. 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 LEFT ICA larger than the RIGHT related to shunt physiology. Exact site of communication is not established. 50-75% stenosis distal V4 segment, RIGHT vertebral. Electronically Signed   By: Staci Righter M.D.   On: 11/06/2015 15:27  Mr Brain Wo Contrast  Result  Date: 11/06/2015 CLINICAL DATA:  Headache. Slurred speech. Last seen normal, earlier today. EXAM: MRI HEAD WITHOUT CONTRAST MRA HEAD WITHOUT CONTRAST TECHNIQUE: Multiplanar, multiecho pulse sequences of the brain and surrounding structures were obtained without intravenous contrast. Angiographic images of the head were obtained using MRA technique without contrast. COMPARISON:  None. CTA head neck earlier today. FINDINGS: MRI HEAD FINDINGS No acute stroke, acute hemorrhage, mass lesion, hydrocephalus, or extra-axial fluid. Normal cerebral volume. No white matter disease. Asymmetrically prominent cavernous and supraclinoid ICA on the LEFT. Bulging LEFT cavernous sinus consistent with the CT diagnosis of CC fistula. Prominent supraorbital vein on the LEFT. Mild LEFT proptosis. Arterial flow voids are maintained. Pituitary, pineal, and cerebellar tonsils unremarkable. No upper cervical lesions. Extracranial soft tissues unremarkable. MRA HEAD FINDINGS Both internal carotid arteries are widely patent, with the LEFT larger, particularly in its cavernous and supraclinoid segments. Direct communication to the cavernous sinus is not established on this noncontrast MRA technique. The supraclinoid ICA and ICA terminus are more vertically oriented on the LEFT than RIGHT, likely related to the fistula. No intracranial anterior circulation stenosis or large vessel occlusion. Normal basilar artery. Vertebrals both contribute to formation of basilar, with a estimated 50-75% stenosis of the distal V4 segment on the RIGHT. Fetal origin LEFT PCA. IMPRESSION: No evidence for acute stroke or mass lesion. No cause seen for slurred speech. 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 LEFT ICA larger than the RIGHT related to shunt physiology. Exact site of communication is not established. 50-75% stenosis distal V4 segment, RIGHT vertebral. Electronically Signed   By: Staci Righter M.D.   On: 11/06/2015 15:27  Ct Head Code Stroke Wo Contrast`  Addendum  Date: 11/06/2015   ADDENDUM REPORT: 11/06/2015 11:57 ADDENDUM: Critical Value/emergent results were called by telephone at the time of interpretation on 11/06/2015 at 11:56 am to Dr. Elson Clan, who verbally acknowledged these results. Electronically Signed   By: Lowella Grip III M.D.   On: 11/06/2015 11:57  Result Date: 11/06/2015 CLINICAL DATA:  Acute onset headache with slurred speech and sensory deficits EXAM: CT HEAD WITHOUT CONTRAST TECHNIQUE: Contiguous axial images were obtained from the base of the skull through the vertex without intravenous contrast. COMPARISON:  None. FINDINGS: The ventricles are normal in size and configuration. There is no intracranial mass, hemorrhage, extra-axial fluid collection, or midline shift. Gray-white compartments appear normal. No acute infarct is evident currently. There is a focal area of hyperdensity in the distal most aspect of the left internal carotid artery at the junction with the origin of the left middle cerebral artery. No other hyperdensity seen. Vascular calcification is evident. The bony calvarium appears intact. Mastoid air cells are clear. Visualized paranasal sinuses are normal. Orbits appear symmetric bilaterally. A metallic foreign body is noted adjacent to the right orbit laterally, a cosmetic ring. IMPRESSION: Focal hyperdensity in the distal most aspect of the left internal carotid artery extending into the origin of the left middle cerebral artery. This finding is best appreciated on axial slices 11 and 12, series 2. This finding raises concern for localized vessel thrombus with earliest changes of infarct on the left. No gray-white compartment lesions are currently seen. No hemorrhage. Study otherwise unremarkable. Electronically Signed: By: Lowella Grip III M.D. On: 11/06/2015 11:54      PHYSICAL EXAM Obese middle aged african american lady not  in distress. . Afebrile. Head is nontraumatic. Neck is supple without bruit.    Cardiac exam no murmur or gallop. Lungs are clear to auscultation. Distal pulses are well felt. Flat in bed due to recent cerebral catheter angiogram Neurological Exam ;  Awake  Alert oriented x 3. Normal speech and language.eye movements full without nystagmus.fundi were not visualized. Vision acuity and fields appear normal. Hearing is normal. Palatal movements are normal. Face symmetric. Tongue midline. Normal strength, tone, reflexes and coordination. Right leg strength testing limited due to increasing cerebral catheter angiogram. Normal sensation. Gait deferred.  ASSESSMENT/PLAN Ms. Tammy Strickland is a 55 y.o. female with history of anxiety, COPD, CAD, depression, diabetes, GERD, HTN  presenting with HA and minimal slurring of speech. She did not receive IV t-PA.   Right brain TIA from small vessel disease  CTA head and neck ? L cavernous carotid fistula. No significant atherosclerosis  MRI  No acute stroke   MRA ?  L cavernous carotid fistula. L ICA larger than R ICA due to shunt. Fetal origin L PCA.R VA 50-75%  Cerebral Catheter angio negative for CCFistula 2D Echo  Technically difficult; definity used; normal LV systolic    function; mild LVH; grade 1 diastolic dysfunction.  LDL 98  HgbA1c 8.3  SCDs for VTE prophylaxis Diet Carb Modified Fluid consistency: Thin; Room service appropriate? Yes  aspirin 81 mg daily prior to admission, now on aspirin 81 mg daily   Patient counseled to be compliant with her antithrombotic medications  Ongoing aggressive stroke risk factor management  Therapy recommendations:  pending   Disposition:  Anticipate return home  L Carotid Cavernous Fistula  Cerebral Angio  Negative for CCF  Hypertension  Stable  Long-term BP goal normotensive  Hyperlipidemia  Home meds:  No statin  LDL 98, goal < 70  Add lipitor 10 mg  Continue statin at  discharge  Diabetes  HgbA1c 8.3, goal < 7.0  Uncontrolled  Other Stroke Risk Factors  Cigarette smoker, advised to stop smoking  Morbid Obesity, Body mass index is 47.22 kg/m., recommend weight loss, diet and exercise as appropriate   Family hx stroke (father)  Coronary artery disease  Chronic diastolic CHF  Other Active Problems  COPD  Neuropathy/R Knee pain  Depression/anxiety  Insomnia  GERD  Hospital day # 0  I have personally examined this patient, reviewed notes, independently viewed imaging studies, participated in medical decision making and plan of care. I have made any additions or clarifications directly to the above note. She presented with transient slurred speech and left leg and hand tingling likely due to a right brain subcortical TIA due to small vessel disease. MRI brain is negative for acute infarct. CT angiogram suggested possible left carotid cavernous fistula. Cerebral catheter angiogram is negative. I had a long discussion with the patient and her son and daughter and answered questions. Recommend aspirin for stroke prevention and smoking cessation.Add zocor for elevate dlipids. Needs better DM control. Greater than 50% time during this 25 minute visit was spent on counseling and coordination of care about stroke and TIA risk, prevention and treatment.D/w Dr Lunette Stands. Will sign off. Follow-up as an outpatient in the stroke clinic in 2 months.Antony Contras, MD Medical Director [facility] Pager: [phone] 11/08/2015 10:50 AM    To contact Stroke Continuity provider, please refer to [email or web address] After hours, contact General Neurology

## 2015-11-08 NOTE — Telephone Encounter (Signed)
Patient requesting call back. °

## 2015-11-11 ENCOUNTER — Encounter: Payer: Self-pay | Admitting: Emergency Medicine

## 2015-11-11 NOTE — Telephone Encounter (Signed)
Letter mailed to address requested by pt.

## 2015-11-13 ENCOUNTER — Encounter (HOSPITAL_COMMUNITY): Payer: Self-pay | Admitting: Interventional Radiology

## 2015-11-19 DIAGNOSIS — Z9289 Personal history of other medical treatment: Secondary | ICD-10-CM

## 2015-11-19 DIAGNOSIS — Z5900 Homelessness unspecified: Secondary | ICD-10-CM

## 2015-11-19 DIAGNOSIS — I10 Essential (primary) hypertension: Secondary | ICD-10-CM

## 2015-11-21 NOTE — Congregational Nurse Program (Signed)
Congregational Nurse Program Note  Date of Encounter: 11/19/2015  Past Medical History: Past Medical History:  Diagnosis Date  . Anxiety   . Arthritis   . COPD (chronic obstructive pulmonary disease) ([place])   . Coronary artery disease   . Depression   . Diabetes mellitus without complication ([place])   . GERD (gastroesophageal reflux disease)   . Hypertension   . Neuromuscular disorder ([place])   . Osteoporosis     Encounter Details:     CNP Questionnaire - 11/21/15 0023      Patient Demographics   Is this a new or existing patient? Existing   Patient is considered a/an Not Applicable   Race African-American/Black     Patient Assistance   Location of Patient Assistance Not Applicable   Patient's financial/insurance status Medicaid   Uninsured Patient No   Patient referred to apply for the following financial assistance Not Applicable   Food insecurities addressed Not Applicable   Transportation assistance No   Assistance securing medications No   Educational health offerings Chronic disease     Encounter Details   Primary purpose of visit Post ED/Hospitalization Visit;Spiritual Care/Support Visit   Was an Emergency Department visit averted? Not Applicable   Does patient have a medical provider? Yes   Patient referred to Follow up with established PCP   Was a mental health screening completed? (GAINS tool) No   Does patient have dental issues? No   Was a dental referral made? No   Does patient have vision issues? No   Was a vision referral made? No   Does your patient have an abnormal blood pressure today? No   Since previous encounter, have you referred patient for abnormal blood pressure that resulted in a new diagnosis or medication change? No   Does your patient have an abnormal blood glucose today? No   Since previous encounter, have you referred patient for abnormal blood glucose that resulted in a new diagnosis or medication change? No   Was there a life-saving  intervention made? No     Client in past recent hospitalization. States she is feeling well ,still a little sleepy. States she had a" stroke". Nurse listed as she  Talked about her health. States I feel good this  Place has really  Helped me ,its a good place ,I have no complaints. Not worried about anything. Ask about her  Daughter states I cant do  Anything about  That  So  I visit with my  Grandchildren and then I leave.  To return to see MD  11-26-15. Did  State her  PCP will need to look at her diabetes and trat her for it ,ask if she knows what her  A1 -C was she stated no  She cant remember. Nurse explained why this  Was important. B?P looks good  Today . States  Approved for section 8 , Client was ask to  Come for blood sugar check on next day  Since she already had dinner today . Client agrees.

## 2015-11-26 ENCOUNTER — Ambulatory Visit (INDEPENDENT_AMBULATORY_CARE_PROVIDER_SITE_OTHER): Payer: Commercial Managed Care - HMO | Admitting: Family Medicine

## 2015-11-26 ENCOUNTER — Ambulatory Visit (INDEPENDENT_AMBULATORY_CARE_PROVIDER_SITE_OTHER): Payer: Commercial Managed Care - HMO

## 2015-11-26 ENCOUNTER — Inpatient Hospital Stay: Payer: Commercial Managed Care - HMO | Admitting: Internal Medicine

## 2015-11-26 ENCOUNTER — Encounter: Payer: Self-pay | Admitting: Family Medicine

## 2015-11-26 VITALS — BP 128/80 | HR 76 | Temp 98.6°F | Resp 16 | Ht 67.5 in | Wt 309.0 lb

## 2015-11-26 DIAGNOSIS — L739 Follicular disorder, unspecified: Secondary | ICD-10-CM

## 2015-11-26 DIAGNOSIS — Z5900 Homelessness unspecified: Secondary | ICD-10-CM

## 2015-11-26 DIAGNOSIS — E0840 Diabetes mellitus due to underlying condition with diabetic neuropathy, unspecified: Secondary | ICD-10-CM | POA: Insufficient documentation

## 2015-11-26 DIAGNOSIS — E0800 Diabetes mellitus due to underlying condition with hyperosmolarity without nonketotic hyperglycemic-hyperosmolar coma (NKHHC): Secondary | ICD-10-CM

## 2015-11-26 DIAGNOSIS — M549 Dorsalgia, unspecified: Secondary | ICD-10-CM | POA: Diagnosis not present

## 2015-11-26 DIAGNOSIS — Z9289 Personal history of other medical treatment: Secondary | ICD-10-CM

## 2015-11-26 DIAGNOSIS — M545 Low back pain: Secondary | ICD-10-CM | POA: Diagnosis not present

## 2015-11-26 DIAGNOSIS — E1149 Type 2 diabetes mellitus with other diabetic neurological complication: Secondary | ICD-10-CM | POA: Insufficient documentation

## 2015-11-26 DIAGNOSIS — M5136 Other intervertebral disc degeneration, lumbar region: Secondary | ICD-10-CM

## 2015-11-26 DIAGNOSIS — M5431 Sciatica, right side: Secondary | ICD-10-CM

## 2015-11-26 DIAGNOSIS — G8929 Other chronic pain: Secondary | ICD-10-CM

## 2015-11-26 DIAGNOSIS — I10 Essential (primary) hypertension: Secondary | ICD-10-CM

## 2015-11-26 DIAGNOSIS — E119 Type 2 diabetes mellitus without complications: Secondary | ICD-10-CM | POA: Insufficient documentation

## 2015-11-26 DIAGNOSIS — E1142 Type 2 diabetes mellitus with diabetic polyneuropathy: Secondary | ICD-10-CM | POA: Insufficient documentation

## 2015-11-26 MED ORDER — HYDROCODONE-ACETAMINOPHEN 5-325 MG PO TABS: 1.0000 | ORAL_TABLET | Freq: Four times a day (QID) | ORAL | 0 refills | Status: DC | PRN

## 2015-11-26 MED ORDER — MUPIROCIN CALCIUM 2 % EX CREA: 1.0000 "application " | TOPICAL_CREAM | Freq: Two times a day (BID) | CUTANEOUS | 0 refills | Status: DC

## 2015-11-26 NOTE — Patient Instructions (Addendum)
I wrote for a short-term prescription for hydrocodone to help with your low back pain until you can be seen by orthopedics.I did refer you to orthopedics to meet with other specialist that may be able to provide injections.Heat or ice to the affected area, Tylenol as needed. If any worsening of symptoms, return here, emergency room, or other medical provider.   As we discussed, I will not be able to provide long-term narcotics for this pain. Follow-up with your primary care provider regarding the previous pain management referral.   You can try the Bactroban ointment to the bumps on the inside of your thigh, but if the swelling increases as we discussed, return for recheck as possible abscess and oral antibiotics may be needed. I do not see any sign of abscess at this time.   IF you received an x-ray today, you will receive an invoice from [facility]. Please contact [facility] at [phone] with questions or concerns regarding your invoice.   IF you received labwork today, you will receive an invoice from [employer]. Please contact [facility] at [phone] with questions or concerns regarding your invoice.   Our billing staff will not be able to assist you with questions regarding bills from these companies.  You will be contacted with the lab results as soon as they are available. The fastest way to get your results is to activate your My Chart account. Instructions are located on the last page of this paperwork. If you have not heard from [place] regarding the results in 2 weeks, please contact this office.     Folliculitis Folliculitis is redness, soreness, and swelling (inflammation) of the hair follicles. This condition can occur anywhere on the body. People with weakened immune systems, diabetes, or obesity have a greater risk of getting folliculitis. CAUSES  Bacterial infection. This is the most common cause.  Fungal infection.  Viral  infection.  Contact with certain chemicals, especially oils and tars. Long-term folliculitis can result from bacteria that live in the nostrils. The bacteria may trigger multiple outbreaks of folliculitis over time. SYMPTOMS Folliculitis most commonly occurs on the scalp, thighs, legs, back, buttocks, and areas where hair is shaved frequently. An early sign of folliculitis is a small, white or yellow, pus-filled, itchy lesion (pustule). These lesions appear on a red, inflamed follicle. They are usually less than 0.2 inches (5 mm) wide. When there is an infection of the follicle that goes deeper, it becomes a boil or furuncle. A group of closely packed boils creates a larger lesion (carbuncle). Carbuncles tend to occur in hairy, sweaty areas of the body. DIAGNOSIS  Your caregiver can usually tell what is wrong by doing a physical exam. A sample may be taken from one of the lesions and tested in a lab. This can help determine what is causing your folliculitis. TREATMENT  Treatment may include:  Applying warm compresses to the affected areas.  Taking antibiotic medicines orally or applying them to the skin.  Draining the lesions if they contain a large amount of pus or fluid.  Laser hair removal for cases of long-lasting folliculitis. This helps to prevent regrowth of the hair. HOME CARE INSTRUCTIONS  Apply warm compresses to the affected areas as directed by your caregiver.  If antibiotics are prescribed, take them as directed. Finish them even if you start to feel better.  You may take over-the-counter medicines to relieve itching.  Do not shave irritated skin.  Follow up with your caregiver as directed. SEEK  IMMEDIATE MEDICAL CARE IF:   You have increasing redness, swelling, or pain in the affected area.  You have a fever. MAKE SURE YOU:  Understand these instructions.  Will watch your condition.  Will get help right away if you are not doing well or get worse.   This  information is not intended to replace advice given to you by your health care provider. Make sure you discuss any questions you have with your health care provider.   Document Released: 06/08/2001 Document Revised: 04/20/2014 Document Reviewed: 06/30/2011 Elsevier Interactive Patient Education 2016 [employer]  Back Pain, Adult Back pain is very common in adults.The cause of back pain is rarely dangerous and the pain often gets better over time.The cause of your back pain may not be known. Some common causes of back pain include:  Strain of the muscles or ligaments supporting the spine.  Wear and tear (degeneration) of the spinal disks.  Arthritis.  Direct injury to the back. For many people, back pain may return. Since back pain is rarely dangerous, most people can learn to manage this condition on their own. HOME CARE INSTRUCTIONS Watch your back pain for any changes. The following actions may help to lessen any discomfort you are feeling:  Remain active. It is stressful on your back to sit or stand in one place for long periods of time. Do not sit, drive, or stand in one place for more than 30 minutes at a time. Take short walks on even surfaces as soon as you are able.Try to increase the length of time you walk each day.  Exercise regularly as directed by your health care provider. Exercise helps your back heal faster. It also helps avoid future injury by keeping your muscles strong and flexible.  Do not stay in bed.Resting more than 1-2 days can delay your recovery.  Pay attention to your body when you bend and lift. The most comfortable positions are those that put less stress on your recovering back. Always use proper lifting techniques, including:  Bending your knees.  Keeping the load close to your body.  Avoiding twisting.  Find a comfortable position to sleep. Use a firm mattress and lie on your side with your knees slightly bent. If you lie on your back, put a  pillow under your knees.  Avoid feeling anxious or stressed.Stress increases muscle tension and can worsen back pain.It is important to recognize when you are anxious or stressed and learn ways to manage it, such as with exercise.  Take medicines only as directed by your health care provider. Over-the-counter medicines to reduce pain and inflammation are often the most helpful.Your health care provider may prescribe muscle relaxant drugs.These medicines help dull your pain so you can more quickly return to your normal activities and healthy exercise.  Apply ice to the injured area:  Put ice in a plastic bag.  Place a towel between your skin and the bag.  Leave the ice on for 20 minutes, 2-3 times a day for the first 2-3 days. After that, ice and heat may be alternated to reduce pain and spasms.  Maintain a healthy weight. Excess weight puts extra stress on your back and makes it difficult to maintain good posture. SEEK MEDICAL CARE IF:  You have pain that is not relieved with rest or medicine.  You have increasing pain going down into the legs or buttocks.  You have pain that does not improve in one week.  You have night pain.  You lose weight.  You have a fever or chills. SEEK IMMEDIATE MEDICAL CARE IF:   You develop new bowel or bladder control problems.  You have unusual weakness or numbness in your arms or legs.  You develop nausea or vomiting.  You develop abdominal pain.  You feel faint.   This information is not intended to replace advice given to you by your health care provider. Make sure you discuss any questions you have with your health care provider.   Document Released: 03/30/2005 Document Revised: 04/20/2014 Document Reviewed: 08/01/2013 Elsevier Interactive Patient Education [employer].

## 2015-11-26 NOTE — Progress Notes (Signed)
Subjective:  By signing my name below, I, Moises Blood, attest that this documentation has been prepared under the direction and in the presence of Merri Ray, MD. Electronically Signed: Moises Blood, [place]. 11/26/2015 , 12:30 PM .  Patient was seen in Room 14 .   Patient ID: Tammy Strickland, female    DOB: [birth year], 55 y.o.   MRN: [id number] Chief Complaint  Patient presents with  . Back Pain    right sided back pain that radiates to right knee  . Other    per pt was to have surgery at [facility] in July but had to put it off due to personal reason    HPI Tammy Strickland is a 55 y.o. female Here for right sided back pain that radiates down into her right knee.  She was recently admitted on July 26th through 28th for slurred speech, and severe headaches. She was diagnosed with a TIA as MRI was negative for stroke. 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 She does have a history of DM, with last A1c 8.3. PCP is Binnie Rail, MD.   Patient states she was supposed to have total left knee replacement surgery July 7th at [facility]. She states her surgeon is Dr. Veverly Fells. They weren't able to proceed with the surgery because she hasn't lose enough weight; will return in 3 months.   Patient reports her back pain has been ongoing for years now, but worsen in the past week with a flare up. She states she used to have 4~6  Injections every 2 weeks into her back in [place] for her chronic low back pain caused by sciatica and 2 bulging discs in her back. Ever since she left [place], she hasn't seen another back specialist. She hasn't received injections into her back, and now it hurts constantly. Her back pain has been ongoing for years now. She's taken prednisone and muscle relaxants in the past without relief. She mentions having relief with hydrocodone 10mg  in the past. She currently takes gabapentin for her back pain. She denies  urinary symptoms, saddle anesthesia, or weakness in her lower extremities. She has informed Dr. Quay Burow about this back pain but hasn't been seen by her for this back pain.   Regarding the hydrocodone, her PCP has referred her to pain management. However, patient states she has not heard back yet.   Patient also notes having an irritated boil over her buttock that was noticed a few days ago.   Patient Active Problem List   Diagnosis Date Noted  . Slurred speech 11/06/2015  . Chronic diastolic congestive heart failure ([place]) 11/06/2015  . Stroke (cerebrum) ([place])   . 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 Chest pain 10/31/2015  . Insomnia 10/10/2015  . Prediabetes 10/10/2015  . Depression 10/10/2015  . Benign essential HTN 08/15/2015  . Fibromyalgia 08/15/2015  . Peripheral neuropathy ([place]) 08/15/2015  . Arthritis 08/15/2015  . CAD (coronary artery disease) 08/15/2015  . COPD (chronic obstructive pulmonary disease) ([place]) 08/15/2015  . History of diabetes mellitus 08/15/2015  . Osteoporosis 08/15/2015  . 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 DDD (degenerative disc disease), lumbar 08/15/2015  . Occupational exposure to industrial toxins 08/15/2015  . GERD (gastroesophageal reflux disease) 08/15/2015   Past Medical History:  Diagnosis Date  . Anxiety   . Arthritis   . COPD (chronic obstructive pulmonary disease) ([place])   . Coronary artery disease   . Depression   .  Diabetes mellitus without complication ([place])   . GERD (gastroesophageal reflux disease)   . Hypertension   . Neuromuscular disorder ([place])   . Osteoporosis   . Stroke [facility])    Past Surgical History:  Procedure Laterality Date  . ABDOMINAL HYSTERECTOMY    . APPENDECTOMY    . CHOLECYSTECTOMY    . IR GENERIC HISTORICAL  11/07/2015   IR ANGIO INTRA EXTRACRAN SEL COM CAROTID INNOMINATE BILAT MOD SED 11/07/2015 Luanne Bras, MD [facility]  . IR GENERIC HISTORICAL  11/07/2015   IR ANGIO VERTEBRAL SEL SUBCLAVIAN INNOMINATE UNI R  MOD SED 11/07/2015 Luanne Bras, MD [facility]  . IR GENERIC HISTORICAL  11/07/2015   IR ANGIO VERTEBRAL SEL VERTEBRAL UNI L MOD SED 11/07/2015 Luanne Bras, MD [facility]  . IR GENERIC HISTORICAL  11/07/2015   IR ANGIOGRAM EXTREMITY LEFT 11/07/2015 Luanne Bras, MD [facility]  . TONSILLECTOMY     No Known Allergies Prior to Admission medications   Medication Sig Start Date End Date Taking? Authorizing Provider  amitriptyline (ELAVIL) 25 MG tablet Take 25 mg at night, increase to 50 mg at night after one week 11/08/15   Binnie Rail, MD  aspirin EC 81 MG tablet Take 1 tablet (81 mg total) by mouth daily. 11/01/15   Silver Huguenin Elgergawy, MD  cloNIDine (CATAPRES) 0.3 MG tablet Take 1 tablet (0.3 mg total) by mouth daily. Reported on 08/15/2015 11/08/15   Binnie Rail, MD  fluticasone (FLONASE) 50 MCG/ACT nasal spray Place 1 spray into both nostrils daily as needed for allergies.     Historical Provider, MD  furosemide (LASIX) 80 MG tablet Take 1 tablet (80 mg total) by mouth daily. 11/08/15   Binnie Rail, MD  gabapentin (NEURONTIN) 800 MG tablet Take 1 tablet (800 mg total) by mouth 3 (three) times daily. 08/29/15   Chelle Jeffery, PA-C  hydrOXYzine (VISTARIL) 25 MG capsule Take 25 mg by mouth 3 (three) times daily. Reported on 08/15/2015    Historical Provider, MD  Ipratropium-Albuterol (COMBIVENT) 20-100 MCG/ACT AERS respimat Inhale 1 puff into the lungs every 6 (six) hours. Patient taking differently: Inhale 1 puff into the lungs every 6 (six) hours as needed.  10/10/15   Binnie Rail, MD  losartan (COZAAR) 50 MG tablet Take 1 tablet (50 mg total) by mouth 2 (two) times daily. 08/29/15   Chelle Jeffery, PA-C  meloxicam (MOBIC) 15 MG tablet Take 1 tablet (15 mg total) by mouth daily. 08/29/15   Chelle Jeffery, PA-C  metoprolol (TOPROL-XL) 200 MG 24 hr tablet Take 1 tablet (200 mg total) by mouth daily. 11/08/15   Binnie Rail, MD  mirtazapine (REMERON) 30 MG tablet Take 30 mg by mouth  at bedtime. 09/02/15   Historical Provider, MD  nitroGLYCERIN (NITROSTAT) 0.4 MG SL tablet Place 0.4 mg under the tongue every 5 (five) minutes as needed for chest pain.    Historical Provider, MD  omeprazole (PRILOSEC) 20 MG capsule Take 1 capsule (20 mg total) by mouth daily. 08/29/15   Chelle Jeffery, PA-C  potassium chloride SA (K-DUR,KLOR-CON) 20 MEQ tablet Take 1 tablet (20 mEq total) by mouth daily. Reported on 08/15/2015 10/10/15   Binnie Rail, MD  tiZANidine (ZANAFLEX) 4 MG tablet Take 2.5 tablets (10 mg total) by mouth 2 (two) times daily as needed. Patient taking differently: Take 8 mg by mouth 3 (three) times daily.  08/29/15   Chelle Jeffery, PA-C  traZODone (DESYREL) 50 MG tablet Take 1 tablet (50  mg total) by mouth at bedtime. 08/29/15   Harrison Mons, PA-C   Social History   Social History  . Marital status: Single    Spouse name: n/a  . Number of children: 3  . Years of education: 12+   Occupational History  . 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 Smoking status: Current Every Day Smoker    Packs/day: 1.00    Years: 35.00    Types: Cigarettes  . Smokeless tobacco: Never Used     Comment: referred  to smoking  cessation  classes. at  [facility]   . Alcohol use No  . 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   Marland Kitchen Sexual activity: Yes    Partners: Female   Other Topics Concern  . Not on file   Social History Narrative   Moved to [place] from [place], [place] February 2017, to help her daughter.   Lives with her daughter.   Sons live in [place] and [place].   She reports that there were originally 17 children in her family (she is the youngest), the oldest are deceased, some prior to her birth, and she isn't sure which were female/female or how they died.   Review of Systems  Constitutional: Negative for chills, fatigue and fever.  Gastrointestinal: Negative for diarrhea, nausea and  vomiting.  Genitourinary: Negative for dysuria, frequency and urgency.  Musculoskeletal: Positive for arthralgias, back pain and joint swelling.  Neurological: Negative for weakness.       Objective:   Physical Exam  Constitutional: She is oriented to person, place, and time. She appears well-developed and well-nourished. No distress.  HENT:  Head: Normocephalic and atraumatic.  Eyes: EOM are normal. Pupils are equal, round, and reactive to light.  Neck: Neck supple.  Cardiovascular: Normal rate.   Pulmonary/Chest: Effort normal. No respiratory distress.  Genitourinary:  Genitourinary Comments: Left inner thigh: 2 small papules approximately 97mm of minimal induration, no exudate, no fluctuance  Musculoskeletal: Normal range of motion.  Negative seated straight leg raise, strength with heel-toe walk but uncomfortable; no midline bony tenderness, lumbar paraspinals on the right are tender, lumbar flexion is at 90 degrees, slight decreased right lateral flexion and decreased right rotation  Neurological: She is alert and oriented to person, place, and time. She displays no Babinski's sign on the right side. She displays no Babinski's sign on the left side.  Reflex Scores:      Patellar reflexes are 1+ on the right side and 1+ on the left side.      Achilles reflexes are 2+ on the right side and 2+ on the left side. Skin: Skin is warm and dry.  Psychiatric: She has a normal mood and affect. Her behavior is normal.  Nursing note and vitals reviewed.   Vitals:   11/26/15 1155  BP: 128/80  Pulse: 76  Resp: 16  Temp: 98.6 F (37 C)  TempSrc: Oral  SpO2: 97%  Weight: (!) 309 lb (140.2 kg)  Height: 5' 7.5" (1.715 m)   Dg Lumbar Spine 2-3 Views  Result Date: 11/26/2015 CLINICAL DATA:  Chronic back pain. EXAM: LUMBAR SPINE - 2-3 VIEW COMPARISON:  No prior . FINDINGS: No acute bony abnormality identified. Multilevel degenerative change present. Normal alignment mineralization. Surgical  clips right upper quadrant . Pelvic calcifications consistent phleboliths. IMPRESSION: No acute abnormality. Electronically Signed   By: Marcello Moores  Register  On: 11/26/2015 13:03      Assessment & Plan:    Latascha Lakey is a 56 y.o. female Chronic back pain ,DDD(degenerative disc disease), lumbar - Sciatica of right side - Plan: Ambulatory referral to Orthopedic Surgery, HYDROcodone-acetaminophen (NORCO/VICODIN) 5-325 MG tablet   - Acute on chronic low back pain with history of degenerative disc disease by her report. Also by discussion, it sounds like she has had epidural spinal injections with some relief.   -she declined prednisone or anti-inflammatories today as she states these have not worked for her in the past, nor muscle relaxant.   -Agreed for short term hydrocodone for breakthrough pain until she can be evaluated by orthopedics. Referral placed to [place] for possible epidural spinal injection.   -RTC precautions if refills needed or worsening symptoms. Did discuss calling primary care provider to determine status of pain management referral as well as to discuss any other assistive devices she may require for her knee or back. She can also discuss this with orthopedics.  Folliculitis - Plan: mupirocin cream (BACTROBAN) 2 %  -No abscess seen. Possible small area of folliculitis. Can try Bactroban cream, warm compresses, RTC precautions if increased swelling or discharge.  Meds ordered this encounter  Medications  . HYDROcodone-acetaminophen (NORCO/VICODIN) 5-325 MG tablet    Sig: Take 1 tablet by mouth every 6 (six) hours as needed for moderate pain.    Dispense:  20 tablet    Refill:  0  . mupirocin cream (BACTROBAN) 2 %    Sig: Apply 1 application topically 2 (two) times daily.    Dispense:  15 g    Refill:  0   Patient Instructions    I wrote for a short-term prescription for hydrocodone to help with your low back pain until you can be seen by orthopedics.I did  refer you to orthopedics to meet with other specialist that may be able to provide injections.Heat or ice to the affected area, Tylenol as needed. If any worsening of symptoms, return here, emergency room, or other medical provider.   As we discussed, I will not be able to provide long-term narcotics for this pain. Follow-up with your primary care provider regarding the previous pain management referral.   You can try the Bactroban ointment to the bumps on the inside of your thigh, but if the swelling increases as we discussed, return for recheck as possible abscess and oral antibiotics may be needed. I do not see any sign of abscess at this time.   IF you received an x-ray today, you will receive an invoice from [facility]. Please contact [facility] at [phone] with questions or concerns regarding your invoice.   IF you received labwork today, you will receive an invoice from [employer]. Please contact [facility] at [phone] with questions or concerns regarding your invoice.   Our billing staff will not be able to assist you with questions regarding bills from these companies.  You will be contacted with the lab results as soon as they are available. The fastest way to get your results is to activate your My Chart account. Instructions are located on the last page of this paperwork. If you have not heard from [place] regarding the results in 2 weeks, please contact this office.     Folliculitis Folliculitis is redness, soreness, and swelling (inflammation) of the hair follicles. This condition can occur anywhere on the body. People with weakened immune systems, diabetes, or obesity have a greater risk of getting  folliculitis. CAUSES  Bacterial infection. This is the most common cause.  Fungal infection.  Viral infection.  Contact with certain chemicals, especially oils and tars. Long-term folliculitis can result from bacteria that live in  the nostrils. The bacteria may trigger multiple outbreaks of folliculitis over time. SYMPTOMS Folliculitis most commonly occurs on the scalp, thighs, legs, back, buttocks, and areas where hair is shaved frequently. An early sign of folliculitis is a small, white or yellow, pus-filled, itchy lesion (pustule). These lesions appear on a red, inflamed follicle. They are usually less than 0.2 inches (5 mm) wide. When there is an infection of the follicle that goes deeper, it becomes a boil or furuncle. A group of closely packed boils creates a larger lesion (carbuncle). Carbuncles tend to occur in hairy, sweaty areas of the body. DIAGNOSIS  Your caregiver can usually tell what is wrong by doing a physical exam. A sample may be taken from one of the lesions and tested in a lab. This can help determine what is causing your folliculitis. TREATMENT  Treatment may include:  Applying warm compresses to the affected areas.  Taking antibiotic medicines orally or applying them to the skin.  Draining the lesions if they contain a large amount of pus or fluid.  Laser hair removal for cases of long-lasting folliculitis. This helps to prevent regrowth of the hair. HOME CARE INSTRUCTIONS  Apply warm compresses to the affected areas as directed by your caregiver.  If antibiotics are prescribed, take them as directed. Finish them even if you start to feel better.  You may take over-the-counter medicines to relieve itching.  Do not shave irritated skin.  Follow up with your caregiver as directed. SEEK IMMEDIATE MEDICAL CARE IF:   You have increasing redness, swelling, or pain in the affected area.  You have a fever. MAKE SURE YOU:  Understand these instructions.  Will watch your condition.  Will get help right away if you are not doing well or get worse.   This information is not intended to replace advice given to you by your health care provider. Make sure you discuss any questions you have with  your health care provider.   Document Released: 06/08/2001 Document Revised: 04/20/2014 Document Reviewed: 06/30/2011 Elsevier Interactive Patient Education 2016 Elsevier Inc.  Back Pain, Adult Back pain is very common in adults.The cause of back pain is rarely dangerous and the pain often gets better over time.The cause of your back pain may not be known. Some common causes of back pain include:  Strain of the muscles or ligaments supporting the spine.  Wear and tear (degeneration) of the spinal disks.  Arthritis.  Direct injury to the back. For many people, back pain may return. Since back pain is rarely dangerous, most people can learn to manage this condition on their own. HOME CARE INSTRUCTIONS Watch your back pain for any changes. The following actions may help to lessen any discomfort you are feeling:  Remain active. It is stressful on your back to sit or stand in one place for long periods of time. Do not sit, drive, or stand in one place for more than 30 minutes at a time. Take short walks on even surfaces as soon as you are able.Try to increase the length of time you walk each day.  Exercise regularly as directed by your health care provider. Exercise helps your back heal faster. It also helps avoid future injury by keeping your muscles strong and flexible.  Do not stay in bed.Resting  more than 1-2 days can delay your recovery.  Pay attention to your body when you bend and lift. The most comfortable positions are those that put less stress on your recovering back. Always use proper lifting techniques, including:  Bending your knees.  Keeping the load close to your body.  Avoiding twisting.  Find a comfortable position to sleep. Use a firm mattress and lie on your side with your knees slightly bent. If you lie on your back, put a pillow under your knees.  Avoid feeling anxious or stressed.Stress increases muscle tension and can worsen back pain.It is important to  recognize when you are anxious or stressed and learn ways to manage it, such as with exercise.  Take medicines only as directed by your health care provider. Over-the-counter medicines to reduce pain and inflammation are often the most helpful.Your health care provider may prescribe muscle relaxant drugs.These medicines help dull your pain so you can more quickly return to your normal activities and healthy exercise.  Apply ice to the injured area:  Put ice in a plastic bag.  Place a towel between your skin and the bag.  Leave the ice on for 20 minutes, 2-3 times a day for the first 2-3 days. After that, ice and heat may be alternated to reduce pain and spasms.  Maintain a healthy weight. Excess weight puts extra stress on your back and makes it difficult to maintain good posture. SEEK MEDICAL CARE IF:  You have pain that is not relieved with rest or medicine.  You have increasing pain going down into the legs or buttocks.  You have pain that does not improve in one week.  You have night pain.  You lose weight.  You have a fever or chills. SEEK IMMEDIATE MEDICAL CARE IF:   You develop new bowel or bladder control problems.  You have unusual weakness or numbness in your arms or legs.  You develop nausea or vomiting.  You develop abdominal pain.  You feel faint.   This information is not intended to replace advice given to you by your health care provider. Make sure you discuss any questions you have with your health care provider.   Document Released: 03/30/2005 Document Revised: 04/20/2014 Document Reviewed: 08/01/2013 Elsevier Interactive Patient Education [employer].     I personally performed the services described in this documentation, which was scribed in my presence. The recorded information has been reviewed and considered, and addended by me as needed.   Signed,   Merri Ray, MD [facility].    11/26/15 4:30 PM

## 2015-11-26 NOTE — Congregational Nurse Program (Signed)
Congregational Nurse Program Note  Date of Encounter: 11/26/2015  Past Medical History: Past Medical History:  Diagnosis Date  . Anxiety   . Arthritis   . COPD (chronic obstructive pulmonary disease) ([place])   . Coronary artery disease   . Depression   . Diabetes mellitus without complication ([place])   . GERD (gastroesophageal reflux disease)   . Hypertension   . Neuromuscular disorder ([place])   . Osteoporosis   . Stroke [facility])     Encounter Details:     CNP Questionnaire - 11/26/15 1853      Patient Demographics   Is this a new or existing patient? Existing   Patient is considered a/an Not Applicable   Race African-American/Black     Patient Assistance   Location of Patient Assistance Not Applicable   Patient's financial/insurance status Medicaid   Uninsured Patient No   Patient referred to apply for the following financial assistance Not Applicable   Food insecurities addressed Not Applicable   Transportation assistance No   Assistance securing medications No   Educational health offerings Diabetes     Encounter Details   Was an Emergency Department visit averted? Not Applicable   Does patient have a medical provider? Yes   Patient referred to Follow up with established PCP   Was a mental health screening completed? (GAINS tool) No   Does patient have dental issues? No   Was a dental referral made? No   Does patient have vision issues? No   Was a vision referral made? No   Does your patient have an abnormal blood pressure today? No   Since previous encounter, have you referred patient for abnormal blood pressure that resulted in a new diagnosis or medication change? No   Does your patient have an abnormal blood glucose today? No   Since previous encounter, have you referred patient for abnormal blood glucose that resulted in a new diagnosis or medication change? No   Was there a life-saving intervention made? No     Client in to see nurse but had eaten dinner and  was preparing to go to church. Nurse had wanted to take blood sugar before dinner ,will complete on tomorrow . Client missed her follow up appointment to see PCP. Nurse encouraged client to reschedule as when she was in the hospital her  A1 C was 8.3 and she needs to start medication. Client will reschedule with PCP and will do blood  Glucose on tomorrow .

## 2015-11-26 NOTE — Progress Notes (Deleted)
Subjective:    Patient ID: Tammy Strickland, female    DOB: 1960-12-16, 55 y.o.   MRN: [id number]  HPI She is here for follow up from the hospital.   She was hospitalized 7/26-7/28. She went to the hospital for severe headaches. She does not have a history of migraines.  The headache was located in her frontal region and she was not having any neurological complaints. She had a CT scan done that revealed possible dense left MCA sign. Stroke team was called. 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 There was a 50-75% stenosis of the distal segment of the right vertebral artery. Patient underwent cerebral angiogram and interventional radiology and it did not show a shunt for venous flow problem.  There is no evidence of stroke. She did possibly have a TIA.  Her blood pressure was well controlled during her hospital stay. No adjustments were made to her medication and she was continued on clonidine, Lasix, losartan and metoprolol. There was concern about the possibility of bradycardia, clonidine and metoprolol.  Diabetes: Her most recent A1c is 8.3%. Was advised to consider starting metformin as an outpatient.  Tobacco abuse: Counseling was done. Nicotine patch causes anxiety.  Medications and allergies reviewed with patient and updated if appropriate.  Patient Active Problem List   Diagnosis Date Noted  . Slurred speech 11/06/2015  . Chronic diastolic congestive heart failure ([place]) 11/06/2015  . Stroke (cerebrum) ([place])   . 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 Chest pain 10/31/2015  . Insomnia 10/10/2015  . Prediabetes 10/10/2015  . Depression 10/10/2015  . Benign essential HTN 08/15/2015  . Fibromyalgia 08/15/2015  . Peripheral neuropathy ([place]) 08/15/2015  . Arthritis 08/15/2015  . CAD (coronary artery disease) 08/15/2015  . COPD  (chronic obstructive pulmonary disease) ([place]) 08/15/2015  . History of diabetes mellitus 08/15/2015  . Osteoporosis 08/15/2015  . 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 DDD (degenerative disc disease), lumbar 08/15/2015  . Occupational exposure to industrial toxins 08/15/2015  . GERD (gastroesophageal reflux disease) 08/15/2015    Current Outpatient Prescriptions on File Prior to Visit  Medication Sig Dispense Refill  . amitriptyline (ELAVIL) 25 MG tablet Take 25 mg at night, increase to 50 mg at night after one week 180 tablet 1  . aspirin EC 81 MG tablet Take 1 tablet (81 mg total) by mouth daily.    . cloNIDine (CATAPRES) 0.3 MG tablet Take 1 tablet (0.3 mg total) by mouth daily. Reported on 08/15/2015 90 tablet 1  . fluticasone (FLONASE) 50 MCG/ACT nasal spray Place 1 spray into both nostrils daily as needed for allergies.     . furosemide (LASIX) 80 MG tablet Take 1 tablet (80 mg total) by mouth daily. 90 tablet 1  . gabapentin (NEURONTIN) 800 MG tablet Take 1 tablet (800 mg total) by mouth 3 (three) times daily. 270 tablet 0  . Ipratropium-Albuterol (COMBIVENT) 20-100 MCG/ACT AERS respimat Inhale 1 puff into the lungs every 6 (six) hours. (Patient taking differently: Inhale 1 puff into the lungs every 6 (six) hours as needed. )    . losartan (COZAAR) 50 MG tablet Take 1 tablet (50 mg total) by mouth 2 (two) times daily. 180 tablet 0  . meloxicam (MOBIC) 15 MG tablet Take 1 tablet (15 mg total) by mouth daily. 90 tablet 0  . metoprolol (TOPROL-XL) 200 MG 24  hr tablet Take 1 tablet (200 mg total) by mouth daily. 90 tablet 1  . mirtazapine (REMERON) 30 MG tablet Take 30 mg by mouth at bedtime.    . nitroGLYCERIN (NITROSTAT) 0.4 MG SL tablet Place 0.4 mg under the tongue every 5 (five) minutes as needed for chest pain.    Marland Kitchen omeprazole (PRILOSEC) 20 MG capsule Take 1 capsule (20 mg total) by mouth daily. 90 capsule 0  . potassium chloride SA (K-DUR,KLOR-CON) 20 MEQ tablet Take 1 tablet (20 mEq total) by  mouth daily. Reported on 08/15/2015 90 tablet 0  . tiZANidine (ZANAFLEX) 4 MG tablet Take 2.5 tablets (10 mg total) by mouth 2 (two) times daily as needed. (Patient taking differently: Take 8 mg by mouth 3 (three) times daily. ) 450 tablet 0  . traZODone (DESYREL) 50 MG tablet Take 1 tablet (50 mg total) by mouth at bedtime. 90 tablet 0   Current Facility-Administered Medications on File Prior to Visit  Medication Dose Route Frequency Provider Last Rate Last Dose  . ibuprofen (ADVIL,MOTRIN) tablet 800 mg  800 mg Oral Daily PRN Monica Becton, MD        Past Medical History:  Diagnosis Date  . Anxiety   . Arthritis   . COPD (chronic obstructive pulmonary disease) ([place])   . Coronary artery disease   . Depression   . Diabetes mellitus without complication ([place])   . GERD (gastroesophageal reflux disease)   . Hypertension   . Neuromuscular disorder ([place])   . Osteoporosis   . Stroke [facility])     Past Surgical History:  Procedure Laterality Date  . ABDOMINAL HYSTERECTOMY    . APPENDECTOMY    . CHOLECYSTECTOMY    . IR GENERIC HISTORICAL  11/07/2015   IR ANGIO INTRA EXTRACRAN SEL COM CAROTID INNOMINATE BILAT MOD SED 11/07/2015 Luanne Bras, MD [facility]  . IR GENERIC HISTORICAL  11/07/2015   IR ANGIO VERTEBRAL SEL SUBCLAVIAN INNOMINATE UNI R MOD SED 11/07/2015 Luanne Bras, MD [facility]  . IR GENERIC HISTORICAL  11/07/2015   IR ANGIO VERTEBRAL SEL VERTEBRAL UNI L MOD SED 11/07/2015 Luanne Bras, MD [facility]  . IR GENERIC HISTORICAL  11/07/2015   IR ANGIOGRAM EXTREMITY LEFT 11/07/2015 Luanne Bras, MD [facility]  . TONSILLECTOMY      Social History   Social History  . Marital status: Single    Spouse name: n/a  . Number of children: 3  . Years of education: 12+   Occupational History  . 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 Smoking status: Current Every Day  Smoker    Packs/day: 1.00    Years: 35.00    Types: Cigarettes  . Smokeless tobacco: Never Used     Comment: referred  to smoking  cessation  classes. at  [facility]   . Alcohol use No  . 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   Marland Kitchen Sexual activity: Yes    Partners: Female   Other Topics Concern  . Not on file   Social History Narrative   Moved to [place] from [place], [place] February 2017, to help her daughter.   Lives with her daughter.   Sons live in [place] and [place].   She reports that there were originally 17 children in her family (she is the youngest), the oldest are deceased, some prior to her birth, and she isn't sure which were  female/female or how they died.    Family History  Problem Relation Age of Onset  . Hyperlipidemia Mother   . Hypertension Mother   . Hyperlipidemia Father   . Hypertension Father   . Stroke Father   . Hypertension Sister   . Cancer Sister     breast cancer  . Crohn's disease Sister     Review of Systems     Objective:  There were no vitals filed for this visit. There were no vitals filed for this visit. There is no height or weight on file to calculate BMI.   Physical Exam        Assessment & Plan:

## 2015-11-27 DIAGNOSIS — E0800 Diabetes mellitus due to underlying condition with hyperosmolarity without nonketotic hyperglycemic-hyperosmolar coma (NKHHC): Secondary | ICD-10-CM

## 2015-11-27 DIAGNOSIS — Z5900 Homelessness unspecified: Secondary | ICD-10-CM

## 2015-11-27 DIAGNOSIS — I10 Essential (primary) hypertension: Secondary | ICD-10-CM

## 2015-11-27 NOTE — Congregational Nurse Program (Signed)
Congregational Nurse Program Note  Date of Encounter: 11/27/2015  Past Medical History: Past Medical History:  Diagnosis Date  . Anxiety   . Arthritis   . COPD (chronic obstructive pulmonary disease) ([place])   . Coronary artery disease   . Depression   . Diabetes mellitus without complication ([place])   . GERD (gastroesophageal reflux disease)   . Hypertension   . Neuromuscular disorder ([place])   . Osteoporosis   . Stroke [facility])     Encounter Details:     CNP Questionnaire - 11/27/15 2324      Patient Demographics   Is this a new or existing patient? Existing   Patient is considered a/an Not Applicable   Race African-American/Black     Patient Assistance   Location of Patient Assistance Not Applicable   Patient's financial/insurance status Medicaid   Uninsured Patient No   Patient referred to apply for the following financial assistance Not Applicable   Food insecurities addressed Not Applicable   Transportation assistance No   Assistance securing medications No   Educational health offerings Diabetes;Hypertension     Encounter Details   Primary purpose of visit Education/Health Concerns;Chronic Illness/Condition Visit;Spiritual Care/Support Visit   Patient referred to Follow up with established PCP   Was a mental health screening completed? (GAINS tool) No   Does patient have dental issues? No   Was a dental referral made? No   Does patient have vision issues? No   Was a vision referral made? No   Does your patient have an abnormal blood pressure today? No   Since previous encounter, have you referred patient for abnormal blood pressure that resulted in a new diagnosis or medication change? No   Does your patient have an abnormal blood glucose today? Yes   Since previous encounter, have you referred patient for abnormal blood glucose that resulted in a new diagnosis or medication change? No   Was there a life-saving intervention made? No     Client forgot to come in  and have her blood sugar checked before dinner but to get a baseline we took it 60 minutes after dinner. Blood sugar was  231 mg. Counseled regarding diabetes and need to follow up with her PCP. Called and got her appointment for 12-09-15 @ 1 pm. A referral made to clients PCP with her blood sugar reading and blood pressure . Will follow up past PCP visit and counsel regarding plan of care and nutrition.

## 2015-12-02 ENCOUNTER — Emergency Department (HOSPITAL_BASED_OUTPATIENT_CLINIC_OR_DEPARTMENT_OTHER)
Admit: 2015-12-02 | Discharge: 2015-12-02 | Disposition: A | Discharge status: Home / Self Care | Payer: Commercial Managed Care - HMO

## 2015-12-02 ENCOUNTER — Emergency Department (HOSPITAL_COMMUNITY): Payer: Commercial Managed Care - HMO

## 2015-12-02 ENCOUNTER — Encounter (HOSPITAL_COMMUNITY): Payer: Self-pay | Admitting: Nurse Practitioner

## 2015-12-02 ENCOUNTER — Emergency Department (HOSPITAL_COMMUNITY)
Admission: EM | Admit: 2015-12-02 | Discharge: 2015-12-02 | Disposition: A | Discharge status: Home / Self Care | Payer: Commercial Managed Care - HMO | Attending: Emergency Medicine | Admitting: Emergency Medicine

## 2015-12-02 ENCOUNTER — Emergency Department (HOSPITAL_COMMUNITY): Admit: 2015-12-02 | Payer: Commercial Managed Care - HMO

## 2015-12-02 DIAGNOSIS — I110 Hypertensive heart disease with heart failure: Secondary | ICD-10-CM | POA: Diagnosis not present

## 2015-12-02 DIAGNOSIS — I5032 Chronic diastolic (congestive) heart failure: Secondary | ICD-10-CM | POA: Diagnosis not present

## 2015-12-02 DIAGNOSIS — E1142 Type 2 diabetes mellitus with diabetic polyneuropathy: Secondary | ICD-10-CM | POA: Insufficient documentation

## 2015-12-02 DIAGNOSIS — M79609 Pain in unspecified limb: Secondary | ICD-10-CM | POA: Diagnosis not present

## 2015-12-02 DIAGNOSIS — Z79899 Other long term (current) drug therapy: Secondary | ICD-10-CM | POA: Diagnosis not present

## 2015-12-02 DIAGNOSIS — Z8673 Personal history of transient ischemic attack (TIA), and cerebral infarction without residual deficits: Secondary | ICD-10-CM | POA: Insufficient documentation

## 2015-12-02 DIAGNOSIS — Z794 Long term (current) use of insulin: Secondary | ICD-10-CM | POA: Diagnosis not present

## 2015-12-02 DIAGNOSIS — J449 Chronic obstructive pulmonary disease, unspecified: Secondary | ICD-10-CM | POA: Insufficient documentation

## 2015-12-02 DIAGNOSIS — Z7982 Long term (current) use of aspirin: Secondary | ICD-10-CM | POA: Diagnosis not present

## 2015-12-02 DIAGNOSIS — R0602 Shortness of breath: Secondary | ICD-10-CM | POA: Diagnosis not present

## 2015-12-02 DIAGNOSIS — F17210 Nicotine dependence, cigarettes, uncomplicated: Secondary | ICD-10-CM | POA: Diagnosis not present

## 2015-12-02 DIAGNOSIS — Z7984 Long term (current) use of oral hypoglycemic drugs: Secondary | ICD-10-CM | POA: Insufficient documentation

## 2015-12-02 DIAGNOSIS — I2510 Atherosclerotic heart disease of native coronary artery without angina pectoris: Secondary | ICD-10-CM | POA: Insufficient documentation

## 2015-12-02 DIAGNOSIS — M79605 Pain in left leg: Secondary | ICD-10-CM | POA: Diagnosis not present

## 2015-12-02 HISTORY — DX: Systemic lupus erythematosus, unspecified: M32.9

## 2015-12-02 HISTORY — DX: Polyneuropathy, unspecified: G62.9

## 2015-12-02 HISTORY — DX: Reserved for concepts with insufficient information to code with codable children: IMO0002

## 2015-12-02 HISTORY — DX: Heart failure, unspecified: I50.9

## 2015-12-02 LAB — BASIC METABOLIC PANEL
Anion gap: 7 (ref 5–15)
BUN: 11 mg/dL (ref 6–20)
CO2: 30 mmol/L (ref 22–32)
Calcium: 9.3 mg/dL (ref 8.9–10.3)
Chloride: 103 mmol/L (ref 101–111)
Creatinine, Ser: 0.71 mg/dL (ref 0.44–1.00)
GFR calc Af Amer: >60 mL/min (ref >60)
GFR calc non Af Amer: >60 mL/min (ref >60)
Glucose, Bld: 150 mg/dL — ABNORMAL HIGH (ref 65–99)
Potassium: 4.2 mmol/L (ref 3.5–5.1)
Sodium: 140 mmol/L (ref 135–145)

## 2015-12-02 LAB — CBC WITH DIFFERENTIAL/PLATELET
Basophils Absolute: 0 10*3/uL (ref 0.0–0.1)
Basophils Relative: 0 %
Eosinophils Absolute: 0.2 10*3/uL (ref 0.0–0.7)
Eosinophils Relative: 3 %
HCT: 41.4 % (ref 36.0–46.0)
Hemoglobin: 13.5 g/dL (ref 12.0–15.0)
Lymphocytes Relative: 52 %
Lymphs Abs: 3.9 10*3/uL (ref 0.7–4.0)
MCH: 28 pg (ref 26.0–34.0)
MCHC: 32.6 g/dL (ref 30.0–36.0)
MCV: 85.9 fL (ref 78.0–100.0)
Monocytes Absolute: 0.5 10*3/uL (ref 0.1–1.0)
Monocytes Relative: 7 %
Neutro Abs: 2.9 10*3/uL (ref 1.7–7.7)
Neutrophils Relative %: 38 %
Platelets: 237 10*3/uL (ref 150–400)
RBC: 4.82 MIL/uL (ref 3.87–5.11)
RDW: 15 % (ref 11.5–15.5)
WBC: 7.6 10*3/uL (ref 4.0–10.5)

## 2015-12-02 LAB — BRAIN NATRIURETIC PEPTIDE: B Natriuretic Peptide: 27.3 pg/mL (ref 0.0–100.0)

## 2015-12-02 LAB — I-STAT TROPONIN, ED: Troponin i, poc: 0 ng/mL (ref 0.00–0.08)

## 2015-12-02 MED ORDER — OXYCODONE-ACETAMINOPHEN 5-325 MG PO TABS: 1.0000 | ORAL_TABLET | Freq: Three times a day (TID) | ORAL | 0 refills | Status: DC | PRN

## 2015-12-02 MED ORDER — CYCLOBENZAPRINE HCL 10 MG PO TABS: 10.0000 mg | ORAL_TABLET | Freq: Two times a day (BID) | ORAL | 0 refills | Status: DC | PRN

## 2015-12-02 MED ORDER — OXYCODONE-ACETAMINOPHEN 5-325 MG PO TABS: 1.0000 | ORAL_TABLET | Freq: Once | ORAL | Status: DC

## 2015-12-02 MED ORDER — DEXAMETHASONE SODIUM PHOSPHATE 10 MG/ML IJ SOLN
10.0000 mg | Freq: Once | INTRAMUSCULAR | Status: AC
  Administered 2015-12-02: 10 mg via INTRAMUSCULAR
  Filled 2015-12-02: qty 1

## 2015-12-02 MED ORDER — KETOROLAC TROMETHAMINE 60 MG/2ML IM SOLN
60.0000 mg | Freq: Once | INTRAMUSCULAR | Status: AC
  Administered 2015-12-02: 60 mg via INTRAMUSCULAR
  Filled 2015-12-02: qty 2

## 2015-12-02 MED ORDER — HYDROMORPHONE HCL 1 MG/ML IJ SOLN
2.0000 mg | Freq: Once | INTRAMUSCULAR | Status: DC
  Filled 2015-12-02: qty 2

## 2015-12-02 MED ORDER — MELOXICAM 7.5 MG PO TABS: 15.0000 mg | ORAL_TABLET | Freq: Every day | ORAL | 0 refills | Status: DC

## 2015-12-02 NOTE — ED Triage Notes (Signed)
Following PA assessment  Pt  Possible  Lt DVT

## 2015-12-02 NOTE — Progress Notes (Signed)
*  Preliminary Results* Left lower extremity venous duplex completed. Patient was unable to tolerate compression maneuvers, therefore exam had to be completed solely with the use of color flow Doppler.  There is no obvious evidence of deep vein thrombosis involving the visualized veins of the left lower extremity by color flow Doppler; all visualized veins appear to be patent. However, due to technical limitations, cannot exclude presence of non-occlusive thrombus.  12/02/2015 1:48 PM  Maudry Mayhew, BS, RVT, RDCS, RDMS

## 2015-12-02 NOTE — ED Provider Notes (Signed)
[facility] DEPT Provider Note   CSN: [id number] Arrival date & time: 12/02/15  1100  By signing my name below, I, Tammy Strickland, attest that this documentation has been prepared under the direction and in the presence of Delsa Grana, PA-C. Electronically Signed: Dora Strickland, Scribe. 12/02/2015. 12:48 PM.  History   Chief Complaint Chief Complaint  Patient presents with  . Leg Pain    The history is provided by the patient. No language interpreter was used.     HPI Comments: Tammy Strickland is a 55 y.o. female with PMHx of arthritis, neuromuscular disorder, peripheral neuropathy, and osteoporosis who presents to the Emergency Department complaining of constant, worsening, left calf pain for the last several weeks. Pt states she was admitted to the hospital for a mini stroke several weeks ago and states her left calf pain presented after leaving; she states she had a blood clot in her frontal lobe. She notes she had left arm weakness and slurred speech prior to admission and these symptoms have resolved. Pt endorses severe left calf pain exacerbation with weight bearing and ambulation; she notes she has been "hobbling" due to pain. Pt endorses associated left foot pain as well as swelling of her left lower leg. Pt reports some numbness of her left lower leg and weakness of her LLE as well. She notes chronic left knee pain (due to arthritis) which has been exacerbated by her current symptoms. She states she needs a total left knee replacement but must lose 50 pounds in order to have the surgery. Pt states her current symptoms do not feel like arthritis. She reports SOB while shouting in church yesterday; she notes she has become SOB with ambulation lately and feels like she needs to stop and catch her breath. Pt believes her SOB may be pain related. She has tried elevating her LLE with no relief of her pain or swelling. Pt does not use a walker or cane. Pt uses aspirin daily. She denies h/o DVT  or other blood clot. She denies birth control use. She is a smoker and states she is trying to quit. She denies chest pain, redness, warmth, or any other associated symptoms.  Past Medical History:  Diagnosis Date  . Anxiety   . Arthritis   . CHF (congestive heart failure) ([place])   . COPD (chronic obstructive pulmonary disease) ([place])   . Coronary artery disease   . Depression   . Diabetes mellitus without complication ([place])   . GERD (gastroesophageal reflux disease)   . Hypertension   . Lupus ([place])   . Neuromuscular disorder ([place])   . Osteoporosis   . Peripheral neuropathy ([place])   . Stroke [facility])     Patient Active Problem List   Diagnosis Date Noted  . Gait disorder 12/04/2015  . Diabetes ([place]) 11/26/2015  . Slurred speech 11/06/2015  . Chronic diastolic congestive heart failure ([place]) 11/06/2015  . TIA (transient ischemic attack)   . 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 Chest pain 10/31/2015  . Insomnia 10/10/2015  . Depression 10/10/2015  . Benign essential HTN 08/15/2015  . Fibromyalgia 08/15/2015  . Peripheral neuropathy ([place]) 08/15/2015  . Arthritis 08/15/2015  . CAD (coronary artery disease) 08/15/2015  . COPD (chronic obstructive pulmonary disease) ([place]) 08/15/2015  . Osteoporosis 08/15/2015  . 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 DDD (degenerative disc disease), lumbar 08/15/2015  . Occupational exposure to industrial toxins 08/15/2015  . GERD (gastroesophageal reflux disease) 08/15/2015    Past Surgical History:  Procedure Laterality Date  .  ABDOMINAL HYSTERECTOMY    . APPENDECTOMY    . CHOLECYSTECTOMY    . IR GENERIC HISTORICAL  11/07/2015   IR ANGIO INTRA EXTRACRAN SEL COM CAROTID INNOMINATE BILAT MOD SED 11/07/2015 Luanne Bras, MD [facility]  . IR GENERIC HISTORICAL  11/07/2015   IR ANGIO VERTEBRAL SEL SUBCLAVIAN INNOMINATE UNI R MOD SED 11/07/2015 Luanne Bras, MD [facility]  . IR GENERIC HISTORICAL  11/07/2015   IR ANGIO VERTEBRAL SEL VERTEBRAL UNI  L MOD SED 11/07/2015 Luanne Bras, MD [facility]  . IR GENERIC HISTORICAL  11/07/2015   IR ANGIOGRAM EXTREMITY LEFT 11/07/2015 Luanne Bras, MD [facility]  . TONSILLECTOMY      OB History    No data available       Home Medications    Prior to Admission medications   Medication Sig Start Date End Date Taking? Authorizing Provider  amitriptyline (ELAVIL) 25 MG tablet Take 25 mg at night, increase to 50 mg at night after one week Patient taking differently: Take 50 mg by mouth at bedtime.  11/08/15  Yes Binnie Rail, MD  aspirin EC 81 MG tablet Take 1 tablet (81 mg total) by mouth daily. 11/01/15  Yes Albertine Patricia, MD  cloNIDine (CATAPRES) 0.3 MG tablet Take 1 tablet (0.3 mg total) by mouth daily. Reported on 08/15/2015 11/08/15  Yes Binnie Rail, MD  fluticasone (FLONASE) 50 MCG/ACT nasal spray Place 1 spray into both nostrils daily as needed for allergies.    Yes Historical Provider, MD  furosemide (LASIX) 80 MG tablet Take 1 tablet (80 mg total) by mouth daily. 11/08/15  Yes Binnie Rail, MD  gabapentin (NEURONTIN) 800 MG tablet Take 1 tablet (800 mg total) by mouth 3 (three) times daily. 08/29/15  Yes Chelle Jeffery, PA-C  HYDROcodone-acetaminophen (NORCO/VICODIN) 5-325 MG tablet Take 1 tablet by mouth every 6 (six) hours as needed for moderate pain. Patient taking differently: Take 1-2 tablets by mouth every 6 (six) hours as needed for moderate pain.  11/26/15  Yes Wendie Agreste, MD  Ipratropium-Albuterol (COMBIVENT) 20-100 MCG/ACT AERS respimat Inhale 1 puff into the lungs every 6 (six) hours. Patient taking differently: Inhale 1 puff into the lungs every 6 (six) hours as needed for wheezing or shortness of breath.  10/10/15  Yes Binnie Rail, MD  losartan (COZAAR) 50 MG tablet Take 1 tablet (50 mg total) by mouth 2 (two) times daily. 08/29/15  Yes Chelle Jeffery, PA-C  meloxicam (MOBIC) 15 MG tablet Take 1 tablet (15 mg total) by mouth daily. 08/29/15  Yes Chelle  Jeffery, PA-C  metoprolol (TOPROL-XL) 200 MG 24 hr tablet Take 1 tablet (200 mg total) by mouth daily. 11/08/15  Yes Binnie Rail, MD  mirtazapine (REMERON) 30 MG tablet Take 30 mg by mouth at bedtime. 09/02/15  Yes Historical Provider, MD  mupirocin cream (BACTROBAN) 2 % Apply 1 application topically 2 (two) times daily. 11/26/15  Yes Wendie Agreste, MD  nitroGLYCERIN (NITROSTAT) 0.4 MG SL tablet Place 0.4 mg under the tongue every 5 (five) minutes as needed for chest pain.   Yes Historical Provider, MD  omeprazole (PRILOSEC) 20 MG capsule Take 1 capsule (20 mg total) by mouth daily. 08/29/15  Yes Chelle Jeffery, PA-C  potassium chloride SA (K-DUR,KLOR-CON) 20 MEQ tablet Take 1 tablet (20 mEq total) by mouth daily. Reported on 08/15/2015 10/10/15  Yes Binnie Rail, MD  tiZANidine (ZANAFLEX) 4 MG tablet Take 2.5 tablets (10 mg total) by mouth 2 (two)  times daily as needed. Patient taking differently: Take 8 mg by mouth 3 (three) times daily.  08/29/15  Yes Chelle Jeffery, PA-C  traZODone (DESYREL) 50 MG tablet Take 1 tablet (50 mg total) by mouth at bedtime. 08/29/15  Yes Chelle Jeffery, PA-C  Blood Glucose Monitoring Suppl (TRUE METRIX AIR GLUCOSE METER) DEVI 1 Device by Does not apply route daily. Use as directed twice daily 12/04/15   Biagio Borg, MD  cyclobenzaprine (FLEXERIL) 10 MG tablet Take 1 tablet (10 mg total) by mouth 2 (two) times daily as needed for muscle spasms. 12/02/15   Delsa Grana, PA-C  glucose blood (TRUE METRIX BLOOD GLUCOSE TEST) test strip Use as instructed twice per day 12/04/15   Biagio Borg, MD  Lancets MISC Use as directed twice per day 12/04/15   Biagio Borg, MD  meloxicam (MOBIC) 7.5 MG tablet Take 2 tablets (15 mg total) by mouth daily. 12/02/15   Delsa Grana, PA-C  metFORMIN (GLUCOPHAGE-XR) 500 MG 24 hr tablet Take 1 tablet (500 mg total) by mouth daily with breakfast. 12/04/15   Biagio Borg, MD  oxyCODONE-acetaminophen (PERCOCET) 10-325 MG tablet Take 1 tablet by mouth  every 6 (six) hours as needed for pain. 12/04/15   Biagio Borg, MD    Family History Family History  Problem Relation Age of Onset  . Hyperlipidemia Mother   . Hypertension Mother   . Hyperlipidemia Father   . Hypertension Father   . Stroke Father   . Hypertension Sister   . Cancer Sister     breast cancer  . Crohn's disease Sister     Social History Social History  Substance Use Topics  . Smoking status: Current Every Day Smoker    Packs/day: 1.00    Years: 35.00    Types: Cigarettes  . Smokeless tobacco: Never Used     Comment: referred  to smoking  cessation  classes. at  [facility]   . Alcohol use No     Allergies   Review of patient's allergies indicates no known allergies.   Review of Systems Review of Systems  All other systems reviewed and are negative.  A complete 10 system review of systems was obtained and all systems are negative except as noted in the HPI and PMH.   Physical Exam Updated Vital Signs BP 139/82 (BP Location: Right Arm)   Pulse 64   Temp 97.8 F (36.6 C) (Oral)   Resp 20   Ht 5' 5.5" (1.664 m)   Wt 135.2 kg   SpO2 100%   BMI 48.84 kg/m   Physical Exam  Constitutional: She is oriented to person, place, and time. She appears well-developed and well-nourished. No distress.  HENT:  Head: Normocephalic and atraumatic.  Nose: Nose normal.  Mouth/Throat: Oropharynx is clear and moist. No oropharyngeal exudate.  Eyes: Conjunctivae and EOM are normal. Pupils are equal, round, and reactive to light. Right eye exhibits no discharge. Left eye exhibits no discharge. No scleral icterus.  Neck: Normal range of motion. Neck supple. No JVD present.  Cardiovascular: Normal rate, regular rhythm, normal heart sounds and intact distal pulses.  Exam reveals no gallop and no friction rub.   No murmur heard. Pulmonary/Chest: Effort normal and breath sounds normal. No respiratory distress. She has no wheezes. She has no rales. She exhibits no tenderness.    Abdominal: Soft. Bowel sounds are normal. She exhibits no distension and no mass. There is no tenderness. There is no rebound and no guarding.  Musculoskeletal: She exhibits edema and tenderness.  Left leg with tight skin, non-pitting edema, diffusely tender over foot, ankle, calf, posterior knee.  ROM testing limited by pain and body habitus  Lymphadenopathy:    She has no cervical adenopathy.  Neurological: She is alert and oriented to person, place, and time. She exhibits normal muscle tone. Coordination normal.  Skin: Skin is warm and dry. Capillary refill takes less than 2 seconds. No rash noted. She is not diaphoretic. No erythema. No pallor.  Psychiatric: She has a normal mood and affect. Her behavior is normal. Judgment and thought content normal.  Nursing note and vitals reviewed.   ED Treatments / Results  Labs (all labs ordered are listed, but only abnormal results are displayed) Labs Reviewed  BASIC METABOLIC PANEL - Abnormal; Notable for the following:       Result Value   Glucose, Bld 150 (*)    All other components within normal limits  CBC WITH DIFFERENTIAL/PLATELET  BRAIN NATRIURETIC PEPTIDE  I-STAT TROPOININ, ED    EKG  EKG Interpretation  Date/Time:  Monday December 02 2015 14:11:29 EDT Ventricular Rate:  59 PR Interval:  178 QRS Duration: 80 QT Interval:  444 QTC Calculation: 439 R Axis:   18 Text Interpretation:  Sinus bradycardia Low voltage QRS Cannot rule out Anterior infarct , age undetermined No significant change since last tracing Confirmed by Maryan Rued  MD, WHITNEY ([id number]) on 12/03/2015 9:10:03 PM       Radiology Dg Chest 2 View  Result Date: 12/02/2015 CLINICAL DATA:  Shortness of breath. Bilateral lower extremity swelling. EXAM: CHEST  2 VIEW COMPARISON:  PA and lateral chest 10/31/2015. FINDINGS: The lungs are clear. Heart size is normal. No pneumothorax or pleural effusion. No focal bony abnormality. Thoracic spondylosis noted. IMPRESSION: No  acute disease. Electronically Signed   By: Inge Rise M.D.   On: 12/02/2015 14:04    Procedures VAS [place] LOWER EXTREMITY VENOUS (DVT) LEFT          *[facility]*                         [address], [place] [address]                            [phone]  ------------------------------------------------------------------- Noninvasive Vascular Lab  Left Lower Extremity Venous Duplex Evaluation  Patient:    Azaiah, Posillico MR #:       [id number] Study Date: 12/02/2015 Gender:     F Age:        55 Height: Weight: BSA: Pt. Status: Room:   ATTENDING    Default, Provider [phone]  SONOGRAPHER  Maudry Mayhew, RVT, RDCS, RDMS  ORDERING     Rosanna Randy  Reports also to:  ------------------------------------------------------------------- History and indications:  Indications  729.5 Pain in limb.  History  Diagnostic evaluation.  ------------------------------------------------------------------- Study information:  Study status:  Routine.  Procedure:  A vascular evaluation was performed with the patient in the supine position. The left common femoral, left femoral, left profunda femoral, left popliteal, left peroneal, and left posterior tibial veins were  studied. Image quality was adequate.    Left lower extremity venous duplex evaluation.     Doppler flow study including B-mode compression maneuvers of all visualized segments, color flow Doppler and selected views of pulsed wave Doppler.  Birthdate:  Patient birthdate: [birth year].  Age:  Patient is 55 yr old.  Sex:  Gender: female.  Study date:  Study date: 12/02/2015. Study time: 01:42 PM.  Location:  Vascular laboratory.  Patient status:  Inpatient.  Venous flow:  +-----------------------+-------------+---------------------------+ Location               Overall       Flow properties             +-----------------------+-------------+---------------------------+ Left common femoral    Patent       Spontaneous                 +-----------------------+-------------+---------------------------+ Left femoral           Patent       Spontaneous                 +-----------------------+-------------+---------------------------+ Left profunda femoral  Not          ---------------------------                        visualized                               +-----------------------+-------------+---------------------------+ Left popliteal         Patent       Phasic; spontaneous;                                            compressible                +-----------------------+-------------+---------------------------+ Left posterior tibial  Not          ---------------------------                        visualized                               +-----------------------+-------------+---------------------------+ Left peroneal          Not          ---------------------------                        visualized                               +-----------------------+-------------+---------------------------+ Left saphenofemoral    Patent       Compressible                junction                                                        +-----------------------+-------------+---------------------------+ Right common femoral   Not          ---------------------------  visualized                               +-----------------------+-------------+---------------------------+  ------------------------------------------------------------------- Summary: Patient was unable to tolerate compression maneuvers, therefore exam had to be completed solely with the use of color flow Doppler.  There is no obvious evidence of deep vein thrombosis involving the visualized veins of the left lower extremity  by color flow Doppler; all visualized veins appear to be patent. However, due to technical limitations, cannot exclude presence of non-occlusive thrombus. Other specific details can be found in the table(s) above. Prepared and Electronically Authenticated by  Gae Gallop MD 2017-08-22T14:24:31  Procedures (including critical care time)  DIAGNOSTIC STUDIES: Oxygen Saturation is 98% on RA, normal by my interpretation.    COORDINATION OF CARE: 12:48 PM Discussed treatment plan with pt at bedside and pt agreed to plan.  Medications Ordered in ED Medications  ketorolac (TORADOL) injection 60 mg (60 mg Intramuscular Given 12/02/15 1329)  dexamethasone (DECADRON) injection 10 mg (10 mg Intramuscular Given 12/02/15 1329)     Initial Impression / Assessment and Plan / ED Course  I have reviewed the triage vital signs and the nursing notes.  Pertinent labs & imaging results that were available during my care of the patient were reviewed by me and considered in my medical decision making (see chart for details).  Clinical Course   Pt with left leg pain and swelling, tight skin, suspicious for DVT.  Recent admission, no injury.  Pt with multiple chronic medical issues.  Basic labs, EKG, troponin, and LE duplex ordered.   Labs unremarkable.  DVT study negative with reported good color flow, unable to complete study secondary to pain, however after discussing test with vascular lab, they state the study was adequate to r/o occlusive DVT as they can visualize flow through all vessels.  EKG did not demonstrate right heart strain, negative troponin, BNP negative, CXR negative.  Pt has normal VS, no tachypnea, hypoxia, or tachycardia.  Pt afebrile. Case discussed with Dr. Eulis Foster who has seen and evaluated the pt, agrees pt is safe to d/c home with negative DVT study.   Pt given pain meds, encouraged close PCP follow up.  I personally performed the services described in this documentation, which was  scribed in my presence. The recorded information has been reviewed and is accurate.     Final Clinical Impressions(s) / ED Diagnoses   Final diagnoses:  Left leg pain    New Prescriptions Discharge Medication List as of 12/02/2015  4:10 PM    START taking these medications   Details  cyclobenzaprine (FLEXERIL) 10 MG tablet Take 1 tablet (10 mg total) by mouth 2 (two) times daily as needed for muscle spasms., Starting Mon 12/02/2015, Print    !! meloxicam (MOBIC) 7.5 MG tablet Take 2 tablets (15 mg total) by mouth daily., Starting Mon 12/02/2015, Print    oxyCODONE-acetaminophen (PERCOCET) 5-325 MG tablet Take 1 tablet by mouth every 8 (eight) hours as needed for severe pain., Starting Mon 12/02/2015, Print     !! - Potential duplicate medications found. Please discuss with provider.       Delsa Grana, PA-C 12/04/15 [place], MD 12/05/15 1538    Daleen Bo, MD 12/10/15 [phone]

## 2015-12-02 NOTE — ED Provider Notes (Signed)
Face-to-face evaluation   History: She presents for evaluation of worsening left leg and left calf pain, present for about 6 weeks, but worsening in the last 2 weeks. Pain is worse when she is standing and walking. She saw her orthopedist about 6 weeks ago, and at that time, he recommended that she lose 50 pounds, before he does a left knee replacement.  Physical exam: Obese, alert, cooperative. Left leg swollen from thigh to foot, with tight and tender left calf.  15:40- patient was unable to tolerate initial Doppler imaging of the left leg, so will medicated with narcotic analgesia, and attempt to repeat. We could consider treating her empirically for a DVT. However, there is a moderately high risk with that approach, and clear diagnostic testing is much more preferable, to make a diagnosis.  Medical screening examination/treatment/procedure(s) were conducted as a shared visit with non-physician practitioner(s) and myself.  I personally evaluated the patient during the encounter   Daleen Bo, MD 12/10/15 [phone]

## 2015-12-02 NOTE — ED Triage Notes (Signed)
Pt c/o 2 week history of L leg pain. Reports pain radiates from hip to foot. She has tried pain pills at home with no relief of the pain. She reports a history of L knee pain and has been told she needs a total knee replacement but must lose weight prior to the surgery. She is alert and breathing easily

## 2015-12-03 ENCOUNTER — Telehealth: Payer: Self-pay | Admitting: Emergency Medicine

## 2015-12-03 ENCOUNTER — Telehealth: Payer: Self-pay

## 2015-12-03 DIAGNOSIS — I10 Essential (primary) hypertension: Secondary | ICD-10-CM

## 2015-12-03 DIAGNOSIS — Z5900 Homelessness unspecified: Secondary | ICD-10-CM

## 2015-12-03 DIAGNOSIS — M25562 Pain in left knee: Secondary | ICD-10-CM

## 2015-12-03 DIAGNOSIS — Z9289 Personal history of other medical treatment: Secondary | ICD-10-CM

## 2015-12-03 DIAGNOSIS — E0800 Diabetes mellitus due to underlying condition with hyperosmolarity without nonketotic hyperglycemic-hyperosmolar coma (NKHHC): Secondary | ICD-10-CM

## 2015-12-03 NOTE — Telephone Encounter (Signed)
Telephone  Call  To  Make a referral for  Medication scripts filled. Referral faxed.

## 2015-12-03 NOTE — Congregational Nurse Program (Signed)
Congregational Nurse Program Note  Date of Encounter: 12/03/2015  Past Medical History: Past Medical History:  Diagnosis Date  . Anxiety   . Arthritis   . CHF (congestive heart failure) ([place])   . COPD (chronic obstructive pulmonary disease) ([place])   . Coronary artery disease   . Depression   . Diabetes mellitus without complication ([place])   . GERD (gastroesophageal reflux disease)   . Hypertension   . Lupus ([place])   . Neuromuscular disorder ([place])   . Osteoporosis   . Peripheral neuropathy ([place])   . Stroke [facility])     Encounter Details:     CNP Questionnaire - 12/03/15 2315      Patient Demographics   Is this a new or existing patient? Existing   Patient is considered a/an Not Applicable   Race African-American/Black     Patient Assistance   Location of Patient Assistance Not Applicable   Patient's financial/insurance status Medicaid;Medicare   Uninsured Patient No   Patient referred to apply for the following financial assistance Not Applicable   Food insecurities addressed Not Applicable   Transportation assistance No   Assistance securing medications Yes   Type of Assistance [facility]   Educational health offerings Diabetes;Chronic disease;Hypertension;Nutrition;Medications     Encounter Details   Primary purpose of visit Chronic Illness/Condition Visit;Education/Health Concerns;Navigating the Healthcare System;Spiritual Care/Support Visit   Was an Emergency Department visit averted? Yes   Does patient have a medical provider? Yes   Patient referred to Follow up with established PCP   Was a mental health screening completed? (GAINS tool) No   Does patient have dental issues? No   Was a dental referral made? No   Does patient have vision issues? No   Was a vision referral made? No   Does your patient have an abnormal blood pressure today? No   Since previous encounter, have you referred patient for abnormal blood pressure that resulted in a new diagnosis or  medication change? No   Does your patient have an abnormal blood glucose today? Yes   Since previous encounter, have you referred patient for abnormal blood glucose that resulted in a new diagnosis or medication change? No   Was there a life-saving intervention made? No     Client in today  for blood  sugar  before  dinner  and was seen in ED  On 12-02-15   for  Left  leg pain. Has prescriptions  she  needs help with . Client has  Medicare  And  [employer] insurance,will file  with  [facility]  and  assist client with  getting medications. Client states  She  Was referred to pain clinic  But  Never got an appointment, clients  Blood sugar today 343 mg  ,Blood  Pressure 127/83,pulse  64. Nurse called  Clients  MD office to get her appointment moved  Up sooner ,talked with  Triage  Nurse ,to change appointment date  To get  Client in sooner for  treatment  Of her diabetes.Counseled  Client regarding her elevated  Blood  Sugar . MD's  Office  To call nurse or  Client for appointment.. Later in evening , Client was called ,has a 9  am. Nurse also called  Pharmacy  To fill   Medication  scripts and they will be  delivered in the am.[place] has appointments  For housing  On tomorrow. Pain in leg she  States is  Psychologist, prison and probation services , found  a walker for client at  [employer]  ,needs  leveling  ,nurse will attempt  to fix.  Follow  Up past PCP visit  for  Follow  up on diabetes.

## 2015-12-03 NOTE — Telephone Encounter (Signed)
Pt called and her blood sugar is 343. She has a hospital fu on 8/28. She wants to know if there is a way you can squeeze her in so she doesn't end up in the ER. I transferred her to team health. Please advise thanks.

## 2015-12-03 NOTE — Telephone Encounter (Signed)
Spoke with pt, offered appt with Dr Roxy Cedar. Pt will call back if she is able to get a ride to the appt.

## 2015-12-03 NOTE — Telephone Encounter (Signed)
Patient can not make it in today during lab hours. oput her on the schedule for tomorrow @ 930. Please place orders for lab. She will come in early tomorrow morning before her 930 appt

## 2015-12-04 ENCOUNTER — Ambulatory Visit (INDEPENDENT_AMBULATORY_CARE_PROVIDER_SITE_OTHER): Payer: Commercial Managed Care - HMO | Admitting: Internal Medicine

## 2015-12-04 ENCOUNTER — Encounter: Payer: Self-pay | Admitting: Internal Medicine

## 2015-12-04 VITALS — BP 138/80 | HR 71 | Temp 98.5°F | Resp 20 | Wt 315.0 lb

## 2015-12-04 DIAGNOSIS — G459 Transient cerebral ischemic attack, unspecified: Secondary | ICD-10-CM

## 2015-12-04 DIAGNOSIS — R269 Unspecified abnormalities of gait and mobility: Secondary | ICD-10-CM | POA: Diagnosis not present

## 2015-12-04 DIAGNOSIS — Z5900 Homelessness unspecified: Secondary | ICD-10-CM

## 2015-12-04 DIAGNOSIS — E0800 Diabetes mellitus due to underlying condition with hyperosmolarity without nonketotic hyperglycemic-hyperosmolar coma (NKHHC): Secondary | ICD-10-CM

## 2015-12-04 DIAGNOSIS — M1712 Unilateral primary osteoarthritis, left knee: Secondary | ICD-10-CM | POA: Diagnosis not present

## 2015-12-04 DIAGNOSIS — E119 Type 2 diabetes mellitus without complications: Secondary | ICD-10-CM | POA: Diagnosis not present

## 2015-12-04 DIAGNOSIS — I10 Essential (primary) hypertension: Secondary | ICD-10-CM

## 2015-12-04 MED ORDER — METFORMIN HCL ER 500 MG PO TB24: 500.0000 mg | ORAL_TABLET | Freq: Every day | ORAL | 11 refills | Status: DC

## 2015-12-04 MED ORDER — TRUE METRIX AIR GLUCOSE METER DEVI: 1.0000 | Freq: Every day | 0 refills | Status: DC

## 2015-12-04 MED ORDER — OXYCODONE-ACETAMINOPHEN 10-325 MG PO TABS: 1.0000 | ORAL_TABLET | Freq: Four times a day (QID) | ORAL | 0 refills | Status: DC | PRN

## 2015-12-04 MED ORDER — GLUCOSE BLOOD VI STRP: ORAL_STRIP | 12 refills | Status: DC

## 2015-12-04 MED ORDER — LANCETS MISC: 11 refills | Status: DC

## 2015-12-04 MED FILL — OXYCODONE-APAP 10-325 TAB: 10-325 | 15 days supply | Qty: 60 | Fill #0

## 2015-12-04 NOTE — Assessment & Plan Note (Signed)
Lab Results  Component Value Date   LDLCALC 98 11/07/2015   Consider statin in light of TIA, but to cont diet for now, f/u lab with next visit with PCP in 6 wks

## 2015-12-04 NOTE — Congregational Nurse Program (Signed)
Congregational Nurse Program Note  Date of Encounter: 12/04/2015  Past Medical History: Past Medical History:  Diagnosis Date  . Anxiety   . Arthritis   . CHF (congestive heart failure) ([place])   . COPD (chronic obstructive pulmonary disease) ([place])   . Coronary artery disease   . Depression   . Diabetes mellitus without complication ([place])   . GERD (gastroesophageal reflux disease)   . Hypertension   . Lupus ([place])   . Neuromuscular disorder ([place])   . Osteoporosis   . Peripheral neuropathy ([place])   . Stroke [facility])     Encounter Details:     CNP Questionnaire - 12/04/15 1947      Patient Demographics   Is this a new or existing patient? Existing   Patient is considered a/an Not Applicable   Race African-American/Black     Patient Assistance   Location of Patient Assistance Not Applicable   Patient's financial/insurance status Medicaid;Medicare   Uninsured Patient No   Patient referred to apply for the following financial assistance Not Applicable   Food insecurities addressed Not Applicable   Transportation assistance No   Assistance securing medications Yes   Type of Assistance [facility]   Educational health offerings Diabetes;Chronic disease;Hypertension;Medications     Encounter Details   Primary purpose of visit Chronic Illness/Condition Visit;Education/Health Concerns;Spiritual Care/Support Visit;Post PCP Visit   Was an Emergency Department visit averted? Yes   Does patient have a medical provider? Yes   Patient referred to Area Agency;Establish PCP   Was a mental health screening completed? (GAINS tool) No   Does patient have dental issues? No   Was a dental referral made? No   Does patient have vision issues? No   Was a vision referral made? No   Does your patient have an abnormal blood pressure today? No   Since previous encounter, have you referred patient for abnormal blood pressure that resulted in a new diagnosis or medication change? No   Does  your patient have an abnormal blood glucose today? No   Since previous encounter, have you referred patient for abnormal blood glucose that resulted in a new diagnosis or medication change? Yes   Was there a life-saving intervention made? No     Client did keep her PCP appointment today and was placed on medication for her blood  [facility] to [facility] ever tomorrow ,insurance to cover. Client has script  For a cane ,nurse will check with  [place] for  her PCP. Marland Kitchen Referred back to  [place]  For an appointment. Client will now  be seen by Dr. Ina Kick . Percocet  Was increased to  10 mg per client ,for leg pain. States pain may be coming  From her knee.. Client excited she has found a place to live and lights will  Be turned on and no advance rental is required, client   agreed on that  Site  today .  Follow  Weekly ,check on cane .

## 2015-12-04 NOTE — Assessment & Plan Note (Signed)
Gave rx to pt for Cane, should use to take wt off left knee, such as at [facility] [address]

## 2015-12-04 NOTE — Assessment & Plan Note (Signed)
With ongoing severe pain now involving whole LLE, for percocet 10 325 q 6 prn, to cont f/u with [facility], likely needs TKR soon, may need pain clinic referral

## 2015-12-04 NOTE — Progress Notes (Signed)
Subjective:    Patient ID: Tammy Strickland, female    DOB: 1960-11-13, 55 y.o.   MRN: [id number]  HPI  Here to f/u for DM, recent a1c about 2 wks ago reportedly about 8.0, cbt yesterday was 343, pt has taken OHA (total 1000 mg metformin) for DM prior to 2015, none from 2015 to 2017 as was better controlled with diet alone, but more recently has been slowed by left knee pain, severe, with wt gain resulting and limps to walk.  Has been followed per [facility], s/p cortisone x 2 but still hurts, in fact radiates to the upper thigh and lower leg as well.  Has had chronic pain related in past, has been on percocet 10.325 but not recently,.  Was seen at ED recently with limited but neg venous doppler for LLE DVT, given percocet 5 325 but has not filled, brings rx with her today, which is voided at visit today.  No recent falls, trauma, fever, Denies urinary symptoms such as dysuria, frequency, urgency, flank pain, hematuria or n/v, fever, chills.  Denies worsening reflux, abd pain, dysphagia, n/v, bowel change or blood.  Pt continues to have recurring LBP without change in severity, bowel or bladder change, fever, wt loss,  worsening LE pain/numbness/weakness, gait change or falls - plans to f/u with [facility], was in contact with them yesterday but has not yet made appt for f/u, may need further ESI per pt.  High dose neurontin not helpful at all.   Wt Readings from Last 3 Encounters:  12/04/15 (!) 315 lb (142.9 kg)  12/02/15 298 lb (135.2 kg)  11/26/15 (!) 309 lb (140.2 kg)  Pt denies new neurological symptoms such as new headache, or facial or extremity weakness or numbness; pt states had recent stroke without residual. 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 There is also 50-75% stenosis of the distal V4 segment in the right vertebral. Patient underwent cerebral angiogram by IR which he did not show any shunt and venous flow within normal  limits  Felt at d/c to be c/w TIA, MRI neg for stroke.   Past Medical History:  Diagnosis Date  . Anxiety   . Arthritis   . CHF (congestive heart failure) ([place])   . COPD (chronic obstructive pulmonary disease) ([place])   . Coronary artery disease   . Depression   . Diabetes mellitus without complication ([place])   . GERD (gastroesophageal reflux disease)   . Hypertension   . Lupus ([place])   . Neuromuscular disorder ([place])   . Osteoporosis   . Peripheral neuropathy ([place])   . Stroke [facility])    Past Surgical History:  Procedure Laterality Date  . ABDOMINAL HYSTERECTOMY    . APPENDECTOMY    . CHOLECYSTECTOMY    . IR GENERIC HISTORICAL  11/07/2015   IR ANGIO INTRA EXTRACRAN SEL COM CAROTID INNOMINATE BILAT MOD SED 11/07/2015 Luanne Bras, MD [facility]INTER[facility]  . IR GENERIC HISTORICAL  11/07/2015   IR ANGIO VERTEBRAL SEL SUBCLAVIAN INNOMINATE UNI R MOD SED 11/07/2015 Luanne Bras, MD [facility]  . IR GENERIC HISTORICAL  11/07/2015   IR ANGIO VERTEBRAL SEL VERTEBRAL UNI L MOD SED 11/07/2015 Luanne Bras, MD [facility]  . IR GENERIC HISTORICAL  11/07/2015   IR ANGIOGRAM EXTREMITY LEFT 11/07/2015 Luanne Bras, MD [facility]  . TONSILLECTOMY      reports that she has been smoking Cigarettes.  She has a 35.00 pack-year  smoking history. She has never used smokeless tobacco. She reports that she does not drink alcohol or use drugs. family history includes Cancer in her sister; Crohn's disease in her sister; Hyperlipidemia in her father and mother; Hypertension in her father, mother, and sister; Stroke in her father. No Known Allergies Current Outpatient Prescriptions on File Prior to Visit  Medication Sig Dispense Refill  . amitriptyline (ELAVIL) 25 MG tablet Take 25 mg at night, increase to 50 mg at night after one week (Patient taking differently: Take 50 mg by mouth at bedtime. ) 180 tablet 1  . aspirin EC 81 MG tablet Take 1 tablet (81 mg total) by mouth daily.    . cloNIDine  (CATAPRES) 0.3 MG tablet Take 1 tablet (0.3 mg total) by mouth daily. Reported on 08/15/2015 90 tablet 1  . cyclobenzaprine (FLEXERIL) 10 MG tablet Take 1 tablet (10 mg total) by mouth 2 (two) times daily as needed for muscle spasms. 20 tablet 0  . fluticasone (FLONASE) 50 MCG/ACT nasal spray Place 1 spray into both nostrils daily as needed for allergies.     . furosemide (LASIX) 80 MG tablet Take 1 tablet (80 mg total) by mouth daily. 90 tablet 1  . gabapentin (NEURONTIN) 800 MG tablet Take 1 tablet (800 mg total) by mouth 3 (three) times daily. 270 tablet 0  . HYDROcodone-acetaminophen (NORCO/VICODIN) 5-325 MG tablet Take 1 tablet by mouth every 6 (six) hours as needed for moderate pain. (Patient taking differently: Take 1-2 tablets by mouth every 6 (six) hours as needed for moderate pain. ) 20 tablet 0  . Ipratropium-Albuterol (COMBIVENT) 20-100 MCG/ACT AERS respimat Inhale 1 puff into the lungs every 6 (six) hours. (Patient taking differently: Inhale 1 puff into the lungs every 6 (six) hours as needed for wheezing or shortness of breath. )    . losartan (COZAAR) 50 MG tablet Take 1 tablet (50 mg total) by mouth 2 (two) times daily. 180 tablet 0  . meloxicam (MOBIC) 15 MG tablet Take 1 tablet (15 mg total) by mouth daily. 90 tablet 0  . meloxicam (MOBIC) 7.5 MG tablet Take 2 tablets (15 mg total) by mouth daily. 30 tablet 0  . metoprolol (TOPROL-XL) 200 MG 24 hr tablet Take 1 tablet (200 mg total) by mouth daily. 90 tablet 1  . mirtazapine (REMERON) 30 MG tablet Take 30 mg by mouth at bedtime.    . mupirocin cream (BACTROBAN) 2 % Apply 1 application topically 2 (two) times daily. 15 g 0  . nitroGLYCERIN (NITROSTAT) 0.4 MG SL tablet Place 0.4 mg under the tongue every 5 (five) minutes as needed for chest pain.    Marland Kitchen omeprazole (PRILOSEC) 20 MG capsule Take 1 capsule (20 mg total) by mouth daily. 90 capsule 0  . potassium chloride SA (K-DUR,KLOR-CON) 20 MEQ tablet Take 1 tablet (20 mEq total) by mouth  daily. Reported on 08/15/2015 90 tablet 0  . tiZANidine (ZANAFLEX) 4 MG tablet Take 2.5 tablets (10 mg total) by mouth 2 (two) times daily as needed. (Patient taking differently: Take 8 mg by mouth 3 (three) times daily. ) 450 tablet 0  . traZODone (DESYREL) 50 MG tablet Take 1 tablet (50 mg total) by mouth at bedtime. 90 tablet 0   Current Facility-Administered Medications on File Prior to Visit  Medication Dose Route Frequency Provider Last Rate Last Dose  . ibuprofen (ADVIL,MOTRIN) tablet 800 mg  800 mg Oral Daily PRN Monica Becton, MD        Review of  Systems  Constitutional: Negative for unusual diaphoresis or night sweats HENT: Negative for ear swelling or discharge Eyes: Negative for worsening visual haziness  Respiratory: Negative for choking and stridor.   Gastrointestinal: Negative for distension or worsening eructation Genitourinary: Negative for retention or change in urine volume.  Musculoskeletal: Negative for other MSK pain or swelling Skin: Negative for color change and worsening wound Neurological: Negative for tremors and numbness other than noted  Psychiatric/Behavioral: Negative for decreased concentration or agitation other than above       Objective:   Physical Exam BP 138/80   Pulse 71   Temp 98.5 F (36.9 C) (Oral)   Resp 20   Wt (!) 315 lb (142.9 kg)   SpO2 97%   BMI 51.62 kg/m  VS noted,  Constitutional: Pt appears in no apparent distress HENT: Head: NCAT.  Right Ear: External ear normal.  Left Ear: External ear normal.  Eyes: . Pupils are equal, round, and reactive to light. Conjunctivae and EOM are normal Neck: Normal range of motion. Neck supple.  Cardiovascular: Normal rate and regular rhythm.   Pulmonary/Chest: Effort normal and breath sounds without rales or wheezing.  Abd:  Soft, NT, ND, + BS Neurological: Pt is alert. Not confused , motor 5/5 intact, LLE diffuse tender but no swelling, erythema, ulcer or drainage Skin: Skin is warm. No  rash, no LE edema Psychiatric: Pt behavior is normal. No agitation.  Gait - limps with less wt bearing to LLE, spine with diffuse tender lowest lumbar Left knee with trace to 1+ effusion, NT, decreased ROM    Assessment & Plan:

## 2015-12-04 NOTE — Patient Instructions (Addendum)
Please take all new medication as prescribed - the metformin ER 500 mg - 2 in the AM  Please check your sugars twice per day  Please take all new medication as prescribed - the pain medication  Please continue all other medications as before, and refills have been done if requested - for the True meter and supplies  Please have the pharmacy call with any other refills you may need.  Pleaese continue your efforts at being mor active, low cholesterol diabetic diet, and weight control.  Please keep your appointments with your specialists as you may have planned, and call [facility] by tomorrow if you have not heard from them;  You may eventually want to see a Pain Clinic specialist for long term pain treatment  You are given the [facility] prescription to use as well  Please see Dr Quay Burow in 6 wk, or sooner if needed, with Lab testing done 3-5 days before

## 2015-12-04 NOTE — Assessment & Plan Note (Addendum)
Uncontrolled, exac by left knee pain/back pain/chronic pain with recent wt gain, will restart metformiin ER 500 mg - 2 in am, gave rx for meter and supplies to pharmacy, to check bid, f/u 6 wks with PCP, or sooner if still > 200  Note:  Total time for pt hx, exam, review of record with pt in the room, determination of diagnoses and plan for further eval and tx is > 40 min, with over 50% spent in coordination and counseling of patient

## 2015-12-04 NOTE — Progress Notes (Signed)
Pre visit review using our clinic review tool, if applicable. No additional management support is needed unless otherwise documented below in the visit note. 

## 2015-12-05 DIAGNOSIS — I10 Essential (primary) hypertension: Secondary | ICD-10-CM

## 2015-12-05 DIAGNOSIS — E0800 Diabetes mellitus due to underlying condition with hyperosmolarity without nonketotic hyperglycemic-hyperosmolar coma (NKHHC): Secondary | ICD-10-CM

## 2015-12-05 DIAGNOSIS — Z5900 Homelessness unspecified: Secondary | ICD-10-CM

## 2015-12-05 DIAGNOSIS — M25562 Pain in left knee: Secondary | ICD-10-CM

## 2015-12-08 NOTE — Progress Notes (Deleted)
Subjective:    Patient ID: Tammy Strickland, female    DOB: 12/20/1960, 55 y.o.   MRN: [id number]  HPI She is here for follow up.  hospitalization 7/20-7/21 for chest pain.  She was admitted to observation overnight.  There were no events on telemetry, EKG showed no changes, cardiac enzymes were negative, 2D echo showed normal EF, grade 1 diastolic dysfunction with no regional wall abnormalities.  She was discharged home with a diagnosis of probable musculoskeletal chest pain, related to generalized body cramping, most likely from hot weather.   Hospitalization 7/26-7/28 for severe frontal headache. She did not have any neurological complaints. She had a Ct scan that showed possible dense left MCA sign.  Ct angio was done and there were findings suggestive of left carotid cavernous fistula.  Stroke team was called.  A cerebral MRI angiogram showed left carotid cavernous fistula, left ICA larger than rigtht related to shut.  50-75% stenosis of distal segment of right vertebral.  She had an cerebral angiogram by IR which did not show any shunt and venous flow was normal.  Her discharge diagnosis was TIA.   ED 8/21 for leg pain.  The left calf was painful for several weeks, which started after her last hospitalization.  She had left arm weakness and slurred speech prior to admission, which had resolved.  She had swelling in her left leg, numbness and weakness in her left leg.  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  She stated dyspnea on exertion.  US of the left leg showed no DVT.  Basic labs and troponin were normal.  EKG showed no changes.  CXR was negative.  She was discharged home with flexeril, mobic and percocet.   Diabetes: She is taking her medication daily as prescribed. She is compliant with a diabetic diet. She is exercising regularly. She monitors her sugars and they have been running XXX. She checks her feet daily and denies foot  lesions. She is up-to-date with an ophthalmology examination.   Hypertension: She is taking her medication daily. She is compliant with a low sodium diet.  She denies chest pain, palpitations, edema, shortness of breath and regular headaches. She is exercising regularly.  She does not monitor her blood pressure at home.      Medications and allergies reviewed with patient and updated if appropriate.  Patient Active Problem List   Diagnosis Date Noted  . Gait disorder 12/04/2015  . Diabetes ([place]) 11/26/2015  . Slurred speech 11/06/2015  . Chronic diastolic congestive heart failure ([place]) 11/06/2015  . TIA (transient ischemic attack)   . 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 Chest pain 10/31/2015  . Insomnia 10/10/2015  . Depression 10/10/2015  . Benign essential HTN 08/15/2015  . Fibromyalgia 08/15/2015  . Peripheral neuropathy ([place]) 08/15/2015  . Arthritis 08/15/2015  . CAD (coronary artery disease) 08/15/2015  . COPD (chronic obstructive pulmonary disease) ([place]) 08/15/2015  . Osteoporosis 08/15/2015  . 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 DDD (degenerative disc disease), lumbar 08/15/2015  . Occupational exposure to industrial toxins 08/15/2015  . GERD (gastroesophageal reflux disease) 08/15/2015    Current Outpatient Prescriptions on File Prior to Visit  Medication Sig Dispense Refill  . amitriptyline (ELAVIL) 25 MG tablet Take 25 mg at night, increase to 50 mg at night after one week (Patient taking differently: Take 50 mg by mouth at bedtime. ) 180 tablet 1  . aspirin  EC 81 MG tablet Take 1 tablet (81 mg total) by mouth daily.    . Blood Glucose Monitoring Suppl (TRUE METRIX AIR GLUCOSE METER) DEVI 1 Device by Does not apply route daily. Use as directed twice daily 1 Device 0  . cloNIDine (CATAPRES) 0.3 MG tablet Take 1 tablet (0.3 mg total) by mouth daily. Reported on 08/15/2015 90 tablet 1  . cyclobenzaprine (FLEXERIL) 10 MG tablet Take 1 tablet (10 mg total) by mouth 2 (two) times  daily as needed for muscle spasms. 20 tablet 0  . fluticasone (FLONASE) 50 MCG/ACT nasal spray Place 1 spray into both nostrils daily as needed for allergies.     . furosemide (LASIX) 80 MG tablet Take 1 tablet (80 mg total) by mouth daily. 90 tablet 1  . gabapentin (NEURONTIN) 800 MG tablet Take 1 tablet (800 mg total) by mouth 3 (three) times daily. 270 tablet 0  . glucose blood (TRUE METRIX BLOOD GLUCOSE TEST) test strip Use as instructed twice per day 200 each 12  . HYDROcodone-acetaminophen (NORCO/VICODIN) 5-325 MG tablet Take 1 tablet by mouth every 6 (six) hours as needed for moderate pain. (Patient taking differently: Take 1-2 tablets by mouth every 6 (six) hours as needed for moderate pain. ) 20 tablet 0  . Ipratropium-Albuterol (COMBIVENT) 20-100 MCG/ACT AERS respimat Inhale 1 puff into the lungs every 6 (six) hours. (Patient taking differently: Inhale 1 puff into the lungs every 6 (six) hours as needed for wheezing or shortness of breath. )    . Lancets MISC Use as directed twice per day 200 each 11  . losartan (COZAAR) 50 MG tablet Take 1 tablet (50 mg total) by mouth 2 (two) times daily. 180 tablet 0  . meloxicam (MOBIC) 15 MG tablet Take 1 tablet (15 mg total) by mouth daily. 90 tablet 0  . meloxicam (MOBIC) 7.5 MG tablet Take 2 tablets (15 mg total) by mouth daily. 30 tablet 0  . metFORMIN (GLUCOPHAGE-XR) 500 MG 24 hr tablet Take 1 tablet (500 mg total) by mouth daily with breakfast. 60 tablet 11  . metoprolol (TOPROL-XL) 200 MG 24 hr tablet Take 1 tablet (200 mg total) by mouth daily. 90 tablet 1  . mirtazapine (REMERON) 30 MG tablet Take 30 mg by mouth at bedtime.    . mupirocin cream (BACTROBAN) 2 % Apply 1 application topically 2 (two) times daily. 15 g 0  . nitroGLYCERIN (NITROSTAT) 0.4 MG SL tablet Place 0.4 mg under the tongue every 5 (five) minutes as needed for chest pain.    Marland Kitchen omeprazole (PRILOSEC) 20 MG capsule Take 1 capsule (20 mg total) by mouth daily. 90 capsule 0  .  oxyCODONE-acetaminophen (PERCOCET) 10-325 MG tablet Take 1 tablet by mouth every 6 (six) hours as needed for pain. 60 tablet 0  . potassium chloride SA (K-DUR,KLOR-CON) 20 MEQ tablet Take 1 tablet (20 mEq total) by mouth daily. Reported on 08/15/2015 90 tablet 0  . tiZANidine (ZANAFLEX) 4 MG tablet Take 2.5 tablets (10 mg total) by mouth 2 (two) times daily as needed. (Patient taking differently: Take 8 mg by mouth 3 (three) times daily. ) 450 tablet 0  . traZODone (DESYREL) 50 MG tablet Take 1 tablet (50 mg total) by mouth at bedtime. 90 tablet 0   Current Facility-Administered Medications on File Prior to Visit  Medication Dose Route Frequency Provider Last Rate Last Dose  . ibuprofen (ADVIL,MOTRIN) tablet 800 mg  800 mg Oral Daily PRN Monica Becton, MD  Past Medical History:  Diagnosis Date  . Anxiety   . Arthritis   . CHF (congestive heart failure) ([place])   . COPD (chronic obstructive pulmonary disease) ([place])   . Coronary artery disease   . Depression   . Diabetes mellitus without complication ([place])   . GERD (gastroesophageal reflux disease)   . Hypertension   . Lupus ([place])   . Neuromuscular disorder ([place])   . Osteoporosis   . Peripheral neuropathy ([place])   . Stroke [facility])     Past Surgical History:  Procedure Laterality Date  . ABDOMINAL HYSTERECTOMY    . APPENDECTOMY    . CHOLECYSTECTOMY    . IR GENERIC HISTORICAL  11/07/2015   IR ANGIO INTRA EXTRACRAN SEL COM CAROTID INNOMINATE BILAT MOD SED 11/07/2015 Luanne Bras, MD [facility]  . IR GENERIC HISTORICAL  11/07/2015   IR ANGIO VERTEBRAL SEL SUBCLAVIAN INNOMINATE UNI R MOD SED 11/07/2015 Luanne Bras, MD [facility]  . IR GENERIC HISTORICAL  11/07/2015   IR ANGIO VERTEBRAL SEL VERTEBRAL UNI L MOD SED 11/07/2015 Luanne Bras, MD [facility]  . IR GENERIC HISTORICAL  11/07/2015   IR ANGIOGRAM EXTREMITY LEFT 11/07/2015 Luanne Bras, MD [facility]  . TONSILLECTOMY      Social History   Social  History  . Marital status: Single    Spouse name: n/a  . Number of children: 3  . Years of education: 12+   Occupational History  . 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 Smoking status: Current Every Day Smoker    Packs/day: 1.00    Years: 35.00    Types: Cigarettes  . Smokeless tobacco: Never Used     Comment: referred  [facility] smoking  cessation  classes. at  [facility]   . Alcohol use No  . 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   Marland Kitchen Sexual activity: Yes    Partners: Female   Other Topics Concern  . Not on file   Social History Narrative   Moved to [place] from [place], [place] February 2017, to help her daughter.   Lives with her daughter.   Sons live in [place] and [place].   She reports that there were originally 17 children in her family (she is the youngest), the oldest are deceased, some prior to her birth, and she isn't sure which were female/female or how they died.    Family History  Problem Relation Age of Onset  . Hyperlipidemia Mother   . Hypertension Mother   . Hyperlipidemia Father   . Hypertension Father   . Stroke Father   . Hypertension Sister   . Cancer Sister     breast cancer  . Crohn's disease Sister     Review of Systems     Objective:  There were no vitals filed for this visit. There were no vitals filed for this visit. There is no height or weight on file to calculate BMI.   Physical Exam        Assessment & Plan:

## 2015-12-09 ENCOUNTER — Inpatient Hospital Stay: Payer: Commercial Managed Care - HMO | Admitting: Internal Medicine

## 2015-12-10 DIAGNOSIS — Z5900 Homelessness unspecified: Secondary | ICD-10-CM

## 2015-12-10 DIAGNOSIS — E0800 Diabetes mellitus due to underlying condition with hyperosmolarity without nonketotic hyperglycemic-hyperosmolar coma (NKHHC): Secondary | ICD-10-CM

## 2015-12-10 DIAGNOSIS — I10 Essential (primary) hypertension: Secondary | ICD-10-CM

## 2015-12-10 NOTE — Congregational Nurse Program (Signed)
Congregational Nurse Program Note  Date of Encounter: 12/05/2015  Past Medical History: Past Medical History:  Diagnosis Date  . Anxiety   . Arthritis   . CHF (congestive heart failure) ([place])   . COPD (chronic obstructive pulmonary disease) ([place])   . Coronary artery disease   . Depression   . Diabetes mellitus without complication ([place])   . GERD (gastroesophageal reflux disease)   . Hypertension   . Lupus ([place])   . Neuromuscular disorder ([place])   . Osteoporosis   . Peripheral neuropathy ([place])   . Stroke [facility])     Encounter Details:     CNP Questionnaire - 12/10/15 1841      Patient Demographics   Is this a new or existing patient? Existing   Patient is considered a/an Not Applicable   Race African-American/Black     Patient Assistance   Location of Patient Assistance Not Applicable   Patient's financial/insurance status Medicaid;Medicare   Uninsured Patient No   Patient referred to apply for the following financial assistance Not Applicable   Food insecurities addressed Not Applicable   Transportation assistance No   Assistance securing medications No   Educational health offerings Chronic disease;Navigating the healthcare system     Encounter Details   Primary purpose of visit Navigating the Healthcare System;Spiritual Care/Support Visit   Was an Emergency Department visit averted? Not Applicable   Does patient have a medical provider? Yes   Patient referred to Follow up with established PCP   Was a mental health screening completed? (GAINS tool) No   Does patient have dental issues? No   Was a dental referral made? No   Does patient have vision issues? No   Was a vision referral made? No   Does your patient have an abnormal blood pressure today? No   Since previous encounter, have you referred patient for abnormal blood pressure that resulted in a new diagnosis or medication change? No   Does your patient have an abnormal blood glucose today? No   Since  previous encounter, have you referred patient for abnormal blood glucose that resulted in a new diagnosis or medication change? No   Was there a life-saving intervention made? Yes     Nurse  took prescription to get  Kasandra Knudsen since  Client has difficulty getting around. Nurse went two different medical supply  Places to get  [place] . Finally  Secured cane for client and delivered cane to  Client  At the  [facility]. Client very appreciative of  Nurses time .

## 2015-12-10 NOTE — Congregational Nurse Program (Signed)
Congregational Nurse Program Note  Date of Encounter: 12/10/2015  Past Medical History: Past Medical History:  Diagnosis Date  . Anxiety   . Arthritis   . CHF (congestive heart failure) ([place])   . COPD (chronic obstructive pulmonary disease) ([place])   . Coronary artery disease   . Depression   . Diabetes mellitus without complication ([place])   . GERD (gastroesophageal reflux disease)   . Hypertension   . Lupus ([place])   . Neuromuscular disorder ([place])   . Osteoporosis   . Peripheral neuropathy ([place])   . Stroke [facility])     Encounter Details:     CNP Questionnaire - 12/10/15 1847      Patient Demographics   Is this a new or existing patient? Existing   Patient is considered a/an Not Applicable   Race African-American/Black     Patient Assistance   Location of Patient Assistance Not Applicable   Patient's financial/insurance status Medicaid;Medicare   Uninsured Patient No   Patient referred to apply for the following financial assistance Not Applicable   Food insecurities addressed Not Applicable   Transportation assistance No   Assistance securing medications No   Educational health offerings Hypertension;Chronic disease;Diabetes;Nutrition     Encounter Details   Primary purpose of visit Chronic Illness/Condition Visit;Education/Health Concerns;Spiritual Care/Support Visit   Was an Emergency Department visit averted? Not Applicable   Does patient have a medical provider? Yes   Patient referred to Follow up with established PCP   Was a mental health screening completed? (GAINS tool) No   Does patient have dental issues? No   Was a dental referral made? No   Does patient have vision issues? No   Was a vision referral made? No   Does your patient have an abnormal blood pressure today? No   Since previous encounter, have you referred patient for abnormal blood pressure that resulted in a new diagnosis or medication change? No   Does your patient have an abnormal blood glucose  today? Yes   Since previous encounter, have you referred patient for abnormal blood glucose that resulted in a new diagnosis or medication change? No   Was there a life-saving intervention made? No     Client in today  For  Blood  Pressure check . Blood pressure  114/81  ,pulse  59.  Reports her  Blood  Sugar readings  To  Nurse,using her log am and pm.   8/28 am  146 mg, pm 186 mg,  8/29  Am  189 mg, pm 186 mg.  Other  Readings have ranged  192 mg  --196 mg  And  269 mg.  Counseled regarding  Readings  And nutrition ans made suggestions  To  Curb carbohydrates . Client understands  foods to  Avoid  And has cut back . MD  May need to elevated medications  / re evaluate  If  Levels  don't  Come  Down . Talked about  Drug  Side  Effects ,information given and  Client was concerned about   Drug law suite against  Metformin. Encouraged to  talk with  Her  PCP  About  Medication and her options.

## 2015-12-12 ENCOUNTER — Other Ambulatory Visit: Payer: Self-pay | Admitting: Physician Assistant

## 2015-12-12 ENCOUNTER — Other Ambulatory Visit: Payer: Self-pay | Admitting: Internal Medicine

## 2015-12-12 DIAGNOSIS — G622 Polyneuropathy due to other toxic agents: Secondary | ICD-10-CM

## 2015-12-12 DIAGNOSIS — I10 Essential (primary) hypertension: Secondary | ICD-10-CM

## 2015-12-20 ENCOUNTER — Other Ambulatory Visit: Payer: Self-pay | Admitting: Physician Assistant

## 2015-12-20 ENCOUNTER — Ambulatory Visit: Payer: Commercial Managed Care - HMO | Admitting: Podiatry

## 2015-12-20 DIAGNOSIS — G622 Polyneuropathy due to other toxic agents: Secondary | ICD-10-CM

## 2015-12-24 ENCOUNTER — Other Ambulatory Visit: Payer: Self-pay | Admitting: *Deleted

## 2015-12-24 MED ORDER — METFORMIN HCL ER 500 MG PO TB24: 500.0000 mg | ORAL_TABLET | Freq: Every day | ORAL | 2 refills | Status: DC

## 2015-12-24 MED ORDER — LOSARTAN POTASSIUM 50 MG PO TABS: 50.0000 mg | ORAL_TABLET | Freq: Two times a day (BID) | ORAL | 2 refills | Status: DC

## 2015-12-24 MED ORDER — TRAZODONE HCL 50 MG PO TABS: 50.0000 mg | ORAL_TABLET | Freq: Every day | ORAL | 2 refills | Status: DC

## 2015-12-24 MED ORDER — OMEPRAZOLE 20 MG PO CPDR: 20.0000 mg | DELAYED_RELEASE_CAPSULE | Freq: Every day | ORAL | 2 refills | Status: DC

## 2015-12-24 NOTE — Telephone Encounter (Signed)
Left msg on triaeg stating will start using mail service "[employer]" to get medications. Needing metformin. Metoprolol, Trazodone & losartan sent to [facility]...Johny Chess

## 2015-12-27 ENCOUNTER — Ambulatory Visit: Payer: Commercial Managed Care - HMO | Admitting: Podiatry

## 2016-01-03 ENCOUNTER — Encounter: Payer: Self-pay | Admitting: Physical Medicine & Rehabilitation

## 2016-01-03 ENCOUNTER — Encounter
Payer: Commercial Managed Care - HMO | Attending: Physical Medicine & Rehabilitation | Admitting: Physical Medicine & Rehabilitation

## 2016-01-03 ENCOUNTER — Other Ambulatory Visit: Payer: Self-pay | Admitting: Physician Assistant

## 2016-01-03 VITALS — BP 123/83 | HR 66

## 2016-01-03 DIAGNOSIS — Z720 Tobacco use: Secondary | ICD-10-CM

## 2016-01-03 DIAGNOSIS — K219 Gastro-esophageal reflux disease without esophagitis: Secondary | ICD-10-CM | POA: Diagnosis not present

## 2016-01-03 DIAGNOSIS — F329 Major depressive disorder, single episode, unspecified: Secondary | ICD-10-CM | POA: Diagnosis not present

## 2016-01-03 DIAGNOSIS — Z9889 Other specified postprocedural states: Secondary | ICD-10-CM | POA: Insufficient documentation

## 2016-01-03 DIAGNOSIS — I509 Heart failure, unspecified: Secondary | ICD-10-CM | POA: Diagnosis not present

## 2016-01-03 DIAGNOSIS — E1140 Type 2 diabetes mellitus with diabetic neuropathy, unspecified: Secondary | ICD-10-CM | POA: Diagnosis not present

## 2016-01-03 DIAGNOSIS — I2510 Atherosclerotic heart disease of native coronary artery without angina pectoris: Secondary | ICD-10-CM | POA: Insufficient documentation

## 2016-01-03 DIAGNOSIS — F419 Anxiety disorder, unspecified: Secondary | ICD-10-CM | POA: Insufficient documentation

## 2016-01-03 DIAGNOSIS — M5440 Lumbago with sciatica, unspecified side: Secondary | ICD-10-CM | POA: Insufficient documentation

## 2016-01-03 DIAGNOSIS — M62838 Other muscle spasm: Secondary | ICD-10-CM

## 2016-01-03 DIAGNOSIS — Z5181 Encounter for therapeutic drug level monitoring: Secondary | ICD-10-CM | POA: Diagnosis not present

## 2016-01-03 DIAGNOSIS — M791 Myalgia: Secondary | ICD-10-CM

## 2016-01-03 DIAGNOSIS — Z79899 Other long term (current) drug therapy: Secondary | ICD-10-CM | POA: Diagnosis not present

## 2016-01-03 DIAGNOSIS — Z9049 Acquired absence of other specified parts of digestive tract: Secondary | ICD-10-CM | POA: Diagnosis not present

## 2016-01-03 DIAGNOSIS — F17210 Nicotine dependence, cigarettes, uncomplicated: Secondary | ICD-10-CM | POA: Insufficient documentation

## 2016-01-03 DIAGNOSIS — M329 Systemic lupus erythematosus, unspecified: Secondary | ICD-10-CM | POA: Diagnosis not present

## 2016-01-03 DIAGNOSIS — R200 Anesthesia of skin: Secondary | ICD-10-CM | POA: Diagnosis not present

## 2016-01-03 DIAGNOSIS — M545 Low back pain, unspecified: Secondary | ICD-10-CM

## 2016-01-03 DIAGNOSIS — G8929 Other chronic pain: Secondary | ICD-10-CM | POA: Diagnosis not present

## 2016-01-03 DIAGNOSIS — M1712 Unilateral primary osteoarthritis, left knee: Secondary | ICD-10-CM | POA: Insufficient documentation

## 2016-01-03 DIAGNOSIS — J449 Chronic obstructive pulmonary disease, unspecified: Secondary | ICD-10-CM | POA: Diagnosis not present

## 2016-01-03 DIAGNOSIS — R6882 Decreased libido: Secondary | ICD-10-CM | POA: Insufficient documentation

## 2016-01-03 DIAGNOSIS — G479 Sleep disorder, unspecified: Secondary | ICD-10-CM | POA: Insufficient documentation

## 2016-01-03 DIAGNOSIS — I10 Essential (primary) hypertension: Secondary | ICD-10-CM

## 2016-01-03 DIAGNOSIS — R269 Unspecified abnormalities of gait and mobility: Secondary | ICD-10-CM

## 2016-01-03 DIAGNOSIS — M609 Myositis, unspecified: Secondary | ICD-10-CM

## 2016-01-03 DIAGNOSIS — Z716 Tobacco abuse counseling: Secondary | ICD-10-CM

## 2016-01-03 DIAGNOSIS — I110 Hypertensive heart disease with heart failure: Secondary | ICD-10-CM | POA: Diagnosis not present

## 2016-01-03 DIAGNOSIS — IMO0001 Reserved for inherently not codable concepts without codable children: Secondary | ICD-10-CM

## 2016-01-03 DIAGNOSIS — Z8673 Personal history of transient ischemic attack (TIA), and cerebral infarction without residual deficits: Secondary | ICD-10-CM | POA: Insufficient documentation

## 2016-01-03 MED ORDER — GABAPENTIN 800 MG PO TABS: 1200.0000 mg | ORAL_TABLET | Freq: Three times a day (TID) | ORAL | 1 refills | Status: DC

## 2016-01-03 NOTE — Progress Notes (Addendum)
Subjective:    Patient ID: Tammy Strickland, female    DOB: 08/16/1960, 55 y.o.   MRN: [id number]  HPI 55 y/o with pmh of chronic low back pain with sciatica, DM with neuropathy, DDD, OA, CAD, COPD, TIA, depression, anxiety, HTN, CHF, Lupus, tobacco abuse present with with low back pain.  Started in 2005.  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  Medications and injections improve the pain.  Doing house work, walking exacerbates the pain.  All qualities of pain.  Radiates to b/l lateral knees.  She has associated numbness, tingling, and weakness.  She has tried PT with aquatic therapy minimal benefit. Heat helps.  She had RFA with benefit.  Pt had ?ESI at [place].  She falls frequently. Pain inhibits her from doing ADLs.   Of note plan for TKA for left knee.  Pain Inventory Average Pain 10 Pain Right Now 9 My pain is sharp, burning, dull, stabbing, tingling and aching  In the last 24 hours, has pain interfered with the following? General activity 10 Relation with others 10 Enjoyment of life 10 What TIME of day is your pain at its worst? daytime, evening , night Sleep (in general) Poor  Pain is worse with: walking and bending Pain improves with: rest, medication, TENS and injections Relief from Meds: 9  Mobility walk with assistance use a cane use a walker how many minutes can you walk? 5 ability to climb steps?  yes do you drive?  no needs help with transfers Do you have any goals in this area?  yes  Function retired I need assistance with the following:  bathing, toileting, household duties and shopping Do you have any goals in this area?  yes  Neuro/Psych weakness numbness tremor tingling trouble walking spasms dizziness depression anxiety  Prior Studies na  Physicians involved in your care na   Family History  Problem Relation Age of Onset  . Hyperlipidemia Mother   . Hypertension Mother   . Hyperlipidemia  Father   . Hypertension Father   . Stroke Father   . Hypertension Sister   . Cancer Sister     breast cancer  . Crohn's disease Sister    Social History   Social History  . Marital status: Single    Spouse name: n/a  . Number of children: 3  . Years of education: 12+   Occupational History  . 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 Smoking status: Current Every Day Smoker    Packs/day: 1.00    Years: 35.00    Types: Cigarettes  . Smokeless tobacco: Never Used     Comment: referred  to smoking  cessation  classes. at  [facility]   . Alcohol use No  . 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   Marland Kitchen Sexual activity: Yes    Partners: Female   Other Topics Concern  . None   Social History Narrative   Moved to [place] from [place], [place] February 2017, to help her daughter.   Lives with her daughter.   Sons live in [place] and [place].   She reports that there were originally 17 children in her family (she is the youngest), the oldest are deceased, some prior to her birth, and she isn't sure which were female/female or how they died.   Past Surgical History:  Procedure Laterality Date  . ABDOMINAL HYSTERECTOMY    . APPENDECTOMY    . CHOLECYSTECTOMY    . IR GENERIC HISTORICAL  11/07/2015   IR ANGIO INTRA EXTRACRAN SEL COM CAROTID INNOMINATE BILAT MOD SED 11/07/2015 Luanne Bras, MD [facility]  . IR GENERIC HISTORICAL  11/07/2015   IR ANGIO VERTEBRAL SEL SUBCLAVIAN INNOMINATE UNI R MOD SED 11/07/2015 Luanne Bras, MD [facility]  . IR GENERIC HISTORICAL  11/07/2015   IR ANGIO VERTEBRAL SEL VERTEBRAL UNI L MOD SED 11/07/2015 Luanne Bras, MD [facility]  . IR GENERIC HISTORICAL  11/07/2015   IR ANGIOGRAM EXTREMITY LEFT 11/07/2015 Luanne Bras, MD [facility]  . TONSILLECTOMY     Past Medical History:  Diagnosis Date  . Anxiety   . Arthritis   . CHF (congestive  heart failure) ([place])   . COPD (chronic obstructive pulmonary disease) ([place])   . Coronary artery disease   . Depression   . Diabetes mellitus without complication ([place])   . GERD (gastroesophageal reflux disease)   . Hypertension   . Lupus ([place])   . Neuromuscular disorder ([place])   . Osteoporosis   . Peripheral neuropathy ([place])   . Stroke ([place])    BP 123/83   Pulse 66   SpO2 94%   Opioid Risk Score:   Fall Risk Score:  `1  Depression screen PHQ 2/9  Depression screen [facility] 2/9 01/03/2016 11/26/2015 08/15/2015  Decreased Interest 1 0 0  Down, Depressed, Hopeless 0 0 0  PHQ - 2 Score 1 0 0  Altered sleeping 3 - -  Tired, decreased energy 2 - -  Change in appetite 1 - -  Feeling bad or failure about yourself  0 - -  Trouble concentrating 0 - -  Moving slowly or fidgety/restless 2 - -  Suicidal thoughts 0 - -  PHQ-9 Score 9 - -    Review of Systems  Constitutional: Positive for unexpected weight change.  HENT: Negative.   Eyes: Negative.   Respiratory: Positive for cough, shortness of breath and wheezing.   Cardiovascular: Negative.   Gastrointestinal: Positive for constipation.  Endocrine: Negative.   Genitourinary: Positive for difficulty urinating.  Musculoskeletal: Positive for back pain.  Skin: Negative.   Allergic/Immunologic: Negative.   Neurological: Positive for dizziness, tremors, weakness and numbness.  Hematological: Negative.   Psychiatric/Behavioral: Positive for dysphoric mood. The patient is nervous/anxious.   All other systems reviewed and are negative.      Objective:   Physical Exam Gen: NAD. Vital signs reviewed HENT: Normocephalic, Atraumatic.  Eyes: EOMI, Scleral icterus Cardio: S1, S2 normal, RRR Pulm: B/l clear to auscultation.  Effort normal Abd: Soft, non-distended, non-tender, BS+ MSK:  Gait antalgic.   TTP along lower back and gluteal muscles.    No edema.   FABERs limitted due to pain with ROM.   Pain with ROM Neuro: CN II-XII grossly  intact.    Sensation intact to light touch in all LE dermatomes  Reflexes 1+ throughout LE  Strength  4/5 in all LE myotomes (pain inhibition)  SLR appears negative, but limited due to pain with ROM. Skin: Warm and Dry    Assessment & Plan:  55 y/o with pmh of chronic low back pain with sciatica, DM with neuropathy, DDD, OA, CAD, COPD, TIA, depression, anxiety, HTN, CHF, Lupus, tobacco abuse present with with low back pain.  1. Chronic mechanical low back pain  She has PT last year, but states she had minimal benefit.  She  also pool therapy with minimal benefit.    Pt had SI and decreased libido with Cymbalta.  Unable to tolerate Lyrica and Seroquel as well.  Lidoderm patches do not help.    L-spine xrays 11/26/15 showing multilevel degeneration  TENs unit helps, but causes her muscle spasms  Cont heat  Cont elavil 50 per PCP  Cont Mobic 15mg  daily with food  Will increase Gabapentin to 1200 TID  Will refer for [facility]  Will obtain records from [place] and consider ESIs in future - reviewed: B/l S1-S2 transforaminal injections x3 (11/2013), B/l SI injections x2 (03/2014), B/l L4-S1 facet injections x2 (04/2014), ?Left knee steroid injection 04/2015    2. Left knee OA  Cont meds per #1  Pt to see follow with Ortho regarding TKA  Will await further management  3. Morbid obesity  Pt states she is supposed to see bariatrician, will consider dietician consult based on findings  4. Abnormality of gait  Will order quad cane for more stability  5. Sleep disturbance  Educated on sleep hygiene  Cont elavil 50 per PCP  Cont trazodone 50 PCP  Encouraged pt to speak with pt regarding weaning some meds  6. Muscle spasms  Cont tizanidine 8mg  TID PRN per PCP  7. Myalgias  Will perform trigger point injections  8. Tobacco abuse  Educated on cessation      Trigger point injection procedure note: Trigger Point Injection: Written consent was obtained for the patient. Trigger points  were identified of lower lumbar paraspinal and gluteal muscles. The areas were cleaned with alcohol, and each of  these trigger points were injected with 1 cc of 0.5% Marcaine. Needle draw back was performed. There were no complications from the procedure, and it was well tolerated.

## 2016-01-03 NOTE — Addendum Note (Signed)
Addended byRoland Rack on: 01/03/2016 01:34 PM   Modules accepted: Orders

## 2016-01-09 DIAGNOSIS — M5136 Other intervertebral disc degeneration, lumbar region: Secondary | ICD-10-CM | POA: Diagnosis not present

## 2016-01-09 DIAGNOSIS — G894 Chronic pain syndrome: Secondary | ICD-10-CM | POA: Diagnosis not present

## 2016-01-10 ENCOUNTER — Ambulatory Visit: Payer: Commercial Managed Care - HMO | Admitting: Physical Medicine & Rehabilitation

## 2016-01-11 LAB — TOXASSURE SELECT,+ANTIDEPR,UR

## 2016-01-13 NOTE — Progress Notes (Signed)
Urine drug screen for this encounter is consistent for prescribed medication. Reported taking oxycodone 9/21 and test was done 01/03/16 . Typically present 2-3 days in urine if taking regularly.  Last prescribed 12/04/15 #60 but could take q 6 hours so may have been taking irregularly.

## 2016-01-15 ENCOUNTER — Encounter: Payer: Self-pay | Admitting: Internal Medicine

## 2016-01-15 ENCOUNTER — Telehealth: Payer: Self-pay | Admitting: Internal Medicine

## 2016-01-15 ENCOUNTER — Other Ambulatory Visit (INDEPENDENT_AMBULATORY_CARE_PROVIDER_SITE_OTHER): Payer: Commercial Managed Care - HMO

## 2016-01-15 ENCOUNTER — Ambulatory Visit (INDEPENDENT_AMBULATORY_CARE_PROVIDER_SITE_OTHER): Payer: Commercial Managed Care - HMO | Admitting: Internal Medicine

## 2016-01-15 VITALS — BP 142/94 | HR 69 | Temp 98.7°F | Resp 16 | Ht 66.0 in | Wt 303.0 lb

## 2016-01-15 DIAGNOSIS — I10 Essential (primary) hypertension: Secondary | ICD-10-CM

## 2016-01-15 DIAGNOSIS — J449 Chronic obstructive pulmonary disease, unspecified: Secondary | ICD-10-CM

## 2016-01-15 DIAGNOSIS — E119 Type 2 diabetes mellitus without complications: Secondary | ICD-10-CM

## 2016-01-15 DIAGNOSIS — F32A Depression, unspecified: Secondary | ICD-10-CM

## 2016-01-15 DIAGNOSIS — E7800 Pure hypercholesterolemia, unspecified: Secondary | ICD-10-CM

## 2016-01-15 DIAGNOSIS — E785 Hyperlipidemia, unspecified: Secondary | ICD-10-CM | POA: Insufficient documentation

## 2016-01-15 DIAGNOSIS — K219 Gastro-esophageal reflux disease without esophagitis: Secondary | ICD-10-CM

## 2016-01-15 DIAGNOSIS — M5136 Other intervertebral disc degeneration, lumbar region: Secondary | ICD-10-CM

## 2016-01-15 DIAGNOSIS — Z23 Encounter for immunization: Secondary | ICD-10-CM | POA: Diagnosis not present

## 2016-01-15 DIAGNOSIS — E1169 Type 2 diabetes mellitus with other specified complication: Secondary | ICD-10-CM | POA: Insufficient documentation

## 2016-01-15 DIAGNOSIS — F329 Major depressive disorder, single episode, unspecified: Secondary | ICD-10-CM

## 2016-01-15 DIAGNOSIS — M1712 Unilateral primary osteoarthritis, left knee: Secondary | ICD-10-CM

## 2016-01-15 DIAGNOSIS — G4700 Insomnia, unspecified: Secondary | ICD-10-CM

## 2016-01-15 DIAGNOSIS — Z79891 Long term (current) use of opiate analgesic: Secondary | ICD-10-CM | POA: Diagnosis not present

## 2016-01-15 LAB — COMPREHENSIVE METABOLIC PANEL
ALT: 22 U/L (ref 0–35)
AST: 25 U/L (ref 0–37)
Albumin: 3.9 g/dL (ref 3.5–5.2)
Alkaline Phosphatase: 99 U/L (ref 39–117)
BUN: 9 mg/dL (ref 6–23)
CO2: 31 mEq/L (ref 19–32)
Calcium: 9.2 mg/dL (ref 8.4–10.5)
Chloride: 100 mEq/L (ref 96–112)
Creatinine, Ser: 0.85 mg/dL (ref 0.40–1.20)
GFR: 89.71 mL/min (ref >60.00)
Glucose, Bld: 135 mg/dL — ABNORMAL HIGH (ref 70–99)
Potassium: 3.6 mEq/L (ref 3.5–5.1)
Sodium: 141 mEq/L (ref 135–145)
Total Bilirubin: 0.3 mg/dL (ref 0.2–1.2)
Total Protein: 7.6 g/dL (ref 6.0–8.3)

## 2016-01-15 LAB — MICROALBUMIN / CREATININE URINE RATIO
Creatinine,U: 27.2 mg/dL
Microalb Creat Ratio: 2.6 mg/g (ref 0.0–30.0)
Microalb, Ur: <0.7 mg/dL (ref 0.0–1.9)

## 2016-01-15 LAB — HEMOGLOBIN A1C: Hgb A1c MFr Bld: 8 % — ABNORMAL HIGH (ref 4.6–6.5)

## 2016-01-15 MED ORDER — AMITRIPTYLINE HCL 25 MG PO TABS: 50.0000 mg | ORAL_TABLET | Freq: Every day | ORAL | 5 refills | Status: DC

## 2016-01-15 MED ORDER — TIZANIDINE HCL 4 MG PO TABS: 10.0000 mg | ORAL_TABLET | Freq: Three times a day (TID) | ORAL | 3 refills | Status: DC

## 2016-01-15 MED ORDER — ATORVASTATIN CALCIUM 20 MG PO TABS: 20.0000 mg | ORAL_TABLET | Freq: Every day | ORAL | 3 refills | Status: DC

## 2016-01-15 MED ORDER — OMEPRAZOLE 20 MG PO CPDR: 20.0000 mg | DELAYED_RELEASE_CAPSULE | Freq: Every day | ORAL | 3 refills | Status: DC

## 2016-01-15 MED ORDER — OXYCODONE-ACETAMINOPHEN 10-325 MG PO TABS: 1.0000 | ORAL_TABLET | Freq: Three times a day (TID) | ORAL | 0 refills | Status: DC | PRN

## 2016-01-15 NOTE — Progress Notes (Signed)
Subjective:    Patient ID: Tammy Strickland, female    DOB: 1960-06-16, 55 y.o.   MRN: [id number]  HPI The patient is here for follow up.  Diabetes: She was started back on medication 6 weeks ago for diabetes. She had prediabetes prior to this. Her sugars became uncontrolled secondary to weight gain from knee and back pain. She was started on metformin extended release 1000 mg in the morning. She is taking her medication daily as prescribed. She is compliant with a diabetic diet. She is not exercising regularly. She monitors her sugars and they have been running 118-212, 134-184 on average in the morning. She checks her feet daily and denies foot lesions. She is up-to-date with an ophthalmology examination.   Hypertension: She is taking her medication daily. She is compliant with a low sodium diet.  She denies chest pain, palpitations, shortness of breath and regular headaches. She is not exercising regularly.  She does monitor her blood pressure at home, it is elevated when she is in pain - varies greatly.      Insomnia: She is taking elavil, trazodone, tizanidine and gabapentin at night.  As long as she takes all these medications she sleeps well. She denies any side effects from medication. At her last visit we discussed starting the amitriptyline, discontinuing the trazodone, which she did not do this. She is okay with discontinuing that today.  Chronic back pain, left knee pain / Muscle spasms in back and legs:  She takes the tizanidine three times a day.  She has been seen pain management and they have been giving her injections in the knee and back, but they do not work. They have referred her to an orthopedic to consider surgery. She also needs a total knee replacement on the left, but was told that she needed to lose weight first. She has lost some weight, but not enough. She has significant pain in her knee. She has been taking Percocet and would like a refill.  GERD:  She is taking her  medication daily as prescribed.  She denies any GERD symptoms and feels her GERD is well controlled.    Medications and allergies reviewed with patient and updated if appropriate.  Patient Active Problem List   Diagnosis Date Noted  . Gait disorder 12/04/2015  . Diabetes ([place]) 11/26/2015  . Slurred speech 11/06/2015  . Chronic diastolic congestive heart failure ([place]) 11/06/2015  . TIA (transient ischemic attack)   . 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 Chest pain 10/31/2015  . Insomnia 10/10/2015  . Depression 10/10/2015  . Benign essential HTN 08/15/2015  . Fibromyalgia 08/15/2015  . Peripheral neuropathy ([place]) 08/15/2015  . Arthritis 08/15/2015  . CAD (coronary artery disease) 08/15/2015  . COPD (chronic obstructive pulmonary disease) ([place]) 08/15/2015  . Osteoporosis 08/15/2015  . 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 DDD (degenerative disc disease), lumbar 08/15/2015  . Occupational exposure to industrial toxins 08/15/2015  . GERD (gastroesophageal reflux disease) 08/15/2015    Current Outpatient Prescriptions on File Prior to Visit  Medication Sig Dispense Refill  . amitriptyline (ELAVIL) 25 MG tablet Take 25 mg at night, increase to 50 mg at night after one week (Patient taking differently: Take 50 mg by mouth at bedtime. ) 180 tablet 1  . aspirin EC 81 MG tablet Take 1 tablet (81 mg total) by mouth daily.    . Blood Glucose Monitoring Suppl (TRUE METRIX AIR GLUCOSE METER) DEVI 1 Device by Does not apply route daily. Use  as directed twice daily 1 Device 0  . cloNIDine (CATAPRES) 0.3 MG tablet Take 1 tablet (0.3 mg total) by mouth daily. Reported on 08/15/2015 90 tablet 1  . cyclobenzaprine (FLEXERIL) 10 MG tablet Take 1 tablet (10 mg total) by mouth 2 (two) times daily as needed for muscle spasms. 20 tablet 0  . fluticasone (FLONASE) 50 MCG/ACT nasal spray Place 1 spray into both nostrils daily as needed for allergies.     . furosemide (LASIX) 80 MG tablet Take 1 tablet (80 mg total) by  mouth daily. 90 tablet 1  . gabapentin (NEURONTIN) 800 MG tablet Take 1.5 tablets (1,200 mg total) by mouth 3 (three) times daily. 135 tablet 1  . glucose blood (TRUE METRIX BLOOD GLUCOSE TEST) test strip Use as instructed twice per day 200 each 12  . Ipratropium-Albuterol (COMBIVENT) 20-100 MCG/ACT AERS respimat Inhale 1 puff into the lungs every 6 (six) hours. (Patient taking differently: Inhale 1 puff into the lungs every 6 (six) hours as needed for wheezing or shortness of breath. )    . Lancets MISC Use as directed twice per day 200 each 11  . losartan (COZAAR) 50 MG tablet Take 1 tablet (50 mg total) by mouth 2 (two) times daily. 180 tablet 2  . meloxicam (MOBIC) 15 MG tablet Take 1 tablet (15 mg total) by mouth daily. 90 tablet 0  . metFORMIN (GLUCOPHAGE-XR) 500 MG 24 hr tablet Take 1 tablet (500 mg total) by mouth daily with breakfast. 180 tablet 2  . metoprolol (TOPROL-XL) 200 MG 24 hr tablet TAKE 1 TABLET (200 MG TOTAL) BY MOUTH DAILY. 90 tablet 2  . mupirocin cream (BACTROBAN) 2 % Apply 1 application topically 2 (two) times daily. 15 g 0  . nitroGLYCERIN (NITROSTAT) 0.4 MG SL tablet Place 0.4 mg under the tongue every 5 (five) minutes as needed for chest pain.    Marland Kitchen omeprazole (PRILOSEC) 20 MG capsule Take 1 capsule (20 mg total) by mouth daily. 90 capsule 2  . oxyCODONE-acetaminophen (PERCOCET) 10-325 MG tablet Take 1 tablet by mouth every 6 (six) hours as needed for pain. 60 tablet 0  . potassium chloride SA (K-DUR,KLOR-CON) 20 MEQ tablet Take 1 tablet (20 mEq total) by mouth daily. Reported on 08/15/2015 90 tablet 0  . tiZANidine (ZANAFLEX) 4 MG tablet Take 2.5 tablets (10 mg total) by mouth 2 (two) times daily as needed. (Patient taking differently: Take 8 mg by mouth 3 (three) times daily. ) 450 tablet 0  . traZODone (DESYREL) 50 MG tablet Take 1 tablet (50 mg total) by mouth at bedtime. 90 tablet 2   No current facility-administered medications on file prior to visit.     Past  Medical History:  Diagnosis Date  . Anxiety   . Arthritis   . CHF (congestive heart failure) ([place])   . COPD (chronic obstructive pulmonary disease) ([place])   . Coronary artery disease   . Depression   . Diabetes mellitus without complication ([place])   . GERD (gastroesophageal reflux disease)   . Hypertension   . Lupus   . Neuromuscular disorder ([place])   . Osteoporosis   . Peripheral neuropathy ([place])   . Stroke [facility])     Past Surgical History:  Procedure Laterality Date  . ABDOMINAL HYSTERECTOMY    . APPENDECTOMY    . CHOLECYSTECTOMY    . IR GENERIC HISTORICAL  11/07/2015   IR ANGIO INTRA EXTRACRAN SEL COM CAROTID INNOMINATE BILAT MOD SED 11/07/2015 Luanne Bras, MD [facility]  .  IR GENERIC HISTORICAL  11/07/2015   IR ANGIO VERTEBRAL SEL SUBCLAVIAN INNOMINATE UNI R MOD SED 11/07/2015 Luanne Bras, MD [facility]  . IR GENERIC HISTORICAL  11/07/2015   IR ANGIO VERTEBRAL SEL VERTEBRAL UNI L MOD SED 11/07/2015 Luanne Bras, MD [facility]  . IR GENERIC HISTORICAL  11/07/2015   IR ANGIOGRAM EXTREMITY LEFT 11/07/2015 Luanne Bras, MD [facility]  . TONSILLECTOMY      Social History   Social History  . Marital status: Single    Spouse name: n/a  . Number of children: 3  . Years of education: 12+   Occupational History  . 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 Smoking status: Current Every Day Smoker    Packs/day: 1.00    Years: 35.00    Types: Cigarettes  . Smokeless tobacco: Never Used     Comment: referred  to smoking  cessation  classes. at  [facility]   . Alcohol use No  . 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   Marland Kitchen Sexual activity: Yes    Partners: Female   Other Topics Concern  . Not on file   Social History Narrative   Moved to [place] from [place], [place] February 2017, to help her daughter.   Lives with her daughter.   Sons live in [place] and  [place].   She reports that there were originally 17 children in her family (she is the youngest), the oldest are deceased, some prior to her birth, and she isn't sure which were female/female or how they died.    Family History  Problem Relation Age of Onset  . Hyperlipidemia Mother   . Hypertension Mother   . Hyperlipidemia Father   . Hypertension Father   . Stroke Father   . Hypertension Sister   . Cancer Sister     breast cancer  . Crohn's disease Sister     Review of Systems  Constitutional: Negative for fever.  Respiratory: Positive for cough and wheezing. Negative for shortness of breath.   Cardiovascular: Positive for leg swelling (chronic). Negative for chest pain and palpitations.  Gastrointestinal: Negative for abdominal pain.       Gerd frequent w/o medication  Neurological: Positive for numbness (feet from back). Negative for dizziness, light-headedness and headaches.  Psychiatric/Behavioral: Negative for dysphoric mood.       Objective:   Vitals:   01/15/16 0807  BP: (!) 142/94  Pulse: 69  Resp: 16  Temp: 98.7 F (37.1 C)   Filed Weights   01/15/16 0807  Weight: (!) 303 lb (137.4 kg)   Body mass index is 48.91 kg/m.   Physical Exam    Constitutional: Appears well-developed and well-nourished. No distress.  HENT:  Head: Normocephalic and atraumatic.  Neck: Neck supple. No tracheal deviation present. No thyromegaly present.  No cervical lymphadenopathy Cardiovascular: Normal rate, regular rhythm and normal heart sounds.   No murmur heard. No carotid bruit .  No edema Pulmonary/Chest: Effort normal and breath sounds normal. No respiratory distress. No has no wheezes. No rales.  Skin: Skin is warm and dry. Not diaphoretic.  Psychiatric: Normal mood and affect. Behavior is normal.      Assessment & Plan:   Flu vaccine today  See Problem List for Assessment and Plan of chronic medical problems.

## 2016-01-15 NOTE — Assessment & Plan Note (Signed)
Currently taking trazodone, tizanidine, gabapentin and amitriptyline Sleeps fairly well, but is still not getting a full night sleep Discontinue trazodone We'll continue the other medications at her current doses

## 2016-01-15 NOTE — Patient Instructions (Addendum)
  Test(s) ordered today. Your results will be released to [facility] (or called to you) after review, usually within 72hours after test completion. If any changes need to be made, you will be notified at that same time.  All other Health Maintenance issues reviewed.   All recommended immunizations and age-appropriate screenings are up-to-date or discussed.  Flu vaccine administered today.   Medications reviewed and updated.  Changes include stopping the trazodone, mobic.  Starting atorvastatin for your cholesterol.    Your prescription(s) have been submitted to your pharmacy. Please take as directed and contact our office if you believe you are having problem(s) with the medication(s).   Please followup in 3 months

## 2016-01-15 NOTE — Assessment & Plan Note (Signed)
Severe pain Has seen orthopedics and needs a total knee replacement, but first needs to lose weight-she is working on weight loss Will continue Percocet for now Refill of Percocet given

## 2016-01-15 NOTE — Progress Notes (Signed)
Pre visit review using our clinic review tool, if applicable. No additional management support is needed unless otherwise documented below in the visit note. 

## 2016-01-15 NOTE — Telephone Encounter (Signed)
Patient requesting all her medications to be sent to [facility] that she got today:  Amitriptyline, atorvastatin, and omeprazole except tizanideine - leave at [facility].

## 2016-01-15 NOTE — Telephone Encounter (Signed)
Patient is also requesting a letter to take to Social Services for an in home nurse to assist her.

## 2016-01-15 NOTE — Assessment & Plan Note (Signed)
She is a diabetic and has high cholesterol Discussed increased risk for stroke and heart attack Start atorvastatin 20 mg daily Discussed possible side effects

## 2016-01-15 NOTE — Assessment & Plan Note (Signed)
Average blood pressure is controlled Continue current medication at current doses CMP today

## 2016-01-15 NOTE — Assessment & Plan Note (Signed)
Currently denies depression Continue amitriptyline 50 mg at bedtime

## 2016-01-15 NOTE — Assessment & Plan Note (Signed)
GERD controlled Continue daily medication  

## 2016-01-15 NOTE — Telephone Encounter (Signed)
Please advise 

## 2016-01-15 NOTE — Assessment & Plan Note (Signed)
Has been following with pain management, but injections have not worked Pain management referred to surgery for assessment of possible surgery Continue current medications including gabapentin, tizanidine and Percocet

## 2016-01-15 NOTE — Assessment & Plan Note (Signed)
Check a1c, urine microalbumin Continue metformin at current dose - will titrate if needed Will have diabetic eye exam Diabetic diet stressed and weight loss stressed

## 2016-01-16 NOTE — Telephone Encounter (Signed)
LVM for pt to call back and discuss.  

## 2016-01-16 NOTE — Telephone Encounter (Signed)
Patient called back. Please follow up . Thank you.

## 2016-01-16 NOTE — Telephone Encounter (Signed)
Does she need a home health referral for nursing?

## 2016-01-17 ENCOUNTER — Encounter: Payer: Self-pay | Admitting: Emergency Medicine

## 2016-01-17 NOTE — Telephone Encounter (Signed)
Please enter referral. Letter has been printed to fax to pts landlord. [phone]

## 2016-01-17 NOTE — Telephone Encounter (Signed)
Patient called back again. YES she is needing a referral for home health nurse to come in home to help her.   She also needs a letter to give her landlord. Staying that she can not go up steps. She needs to be moved to a bottom level. Patient is going to get the fax number from the landlord if you want to just fax that letter. She states she should have it by the time Lovena Le calls her back.  Please follow up with patient. Thank you.

## 2016-01-19 NOTE — Telephone Encounter (Signed)
Referral for home health ordered

## 2016-01-20 ENCOUNTER — Telehealth: Payer: Self-pay | Admitting: *Deleted

## 2016-01-20 ENCOUNTER — Other Ambulatory Visit: Payer: Self-pay | Admitting: Emergency Medicine

## 2016-01-20 MED ORDER — METFORMIN HCL ER 500 MG PO TB24: 500.0000 mg | ORAL_TABLET | Freq: Two times a day (BID) | ORAL | 1 refills | Status: DC

## 2016-01-20 MED ORDER — SULFAMETHOXAZOLE-TRIMETHOPRIM 800-160 MG PO TABS: 1.0000 | ORAL_TABLET | Freq: Two times a day (BID) | ORAL | 0 refills | Status: DC

## 2016-01-20 NOTE — Telephone Encounter (Signed)
Called pt to give lab results. Pt stated that she has a boil coming up. She has a hx of boils and states that Bactrim has helped her in the past and would like an rx sent to [facility] for this.   New rx has been sent for Metformin

## 2016-01-20 NOTE — Telephone Encounter (Signed)
Called patient to clarify procedure performed on 05/09/15 at [facility] by Dr Margy Clarks.   Patient stated that on this date she was given a Left knee injection and not a left shoulder injection. [facility] didn't return several calls.

## 2016-01-20 NOTE — Telephone Encounter (Signed)
rx pending - ok to send to pharmacy.  If no improvement she needs to be seen

## 2016-01-20 NOTE — Telephone Encounter (Signed)
LVM informing pt

## 2016-01-22 ENCOUNTER — Encounter
Payer: Commercial Managed Care - HMO | Attending: Physical Medicine & Rehabilitation | Admitting: Physical Medicine & Rehabilitation

## 2016-01-22 ENCOUNTER — Encounter (HOSPITAL_COMMUNITY): Payer: Self-pay | Admitting: *Deleted

## 2016-01-22 ENCOUNTER — Emergency Department (HOSPITAL_COMMUNITY)
Admission: EM | Admit: 2016-01-22 | Discharge: 2016-01-22 | Disposition: A | Discharge status: Home / Self Care | Payer: Commercial Managed Care - HMO | Attending: Emergency Medicine | Admitting: Emergency Medicine

## 2016-01-22 ENCOUNTER — Emergency Department (HOSPITAL_COMMUNITY): Payer: Commercial Managed Care - HMO

## 2016-01-22 DIAGNOSIS — G479 Sleep disorder, unspecified: Secondary | ICD-10-CM | POA: Insufficient documentation

## 2016-01-22 DIAGNOSIS — Z7984 Long term (current) use of oral hypoglycemic drugs: Secondary | ICD-10-CM | POA: Insufficient documentation

## 2016-01-22 DIAGNOSIS — S39011A Strain of muscle, fascia and tendon of abdomen, initial encounter: Secondary | ICD-10-CM | POA: Diagnosis not present

## 2016-01-22 DIAGNOSIS — E1140 Type 2 diabetes mellitus with diabetic neuropathy, unspecified: Secondary | ICD-10-CM | POA: Insufficient documentation

## 2016-01-22 DIAGNOSIS — X58XXXA Exposure to other specified factors, initial encounter: Secondary | ICD-10-CM | POA: Diagnosis not present

## 2016-01-22 DIAGNOSIS — Y929 Unspecified place or not applicable: Secondary | ICD-10-CM | POA: Diagnosis not present

## 2016-01-22 DIAGNOSIS — M5440 Lumbago with sciatica, unspecified side: Secondary | ICD-10-CM | POA: Insufficient documentation

## 2016-01-22 DIAGNOSIS — Z9889 Other specified postprocedural states: Secondary | ICD-10-CM | POA: Insufficient documentation

## 2016-01-22 DIAGNOSIS — I110 Hypertensive heart disease with heart failure: Secondary | ICD-10-CM | POA: Insufficient documentation

## 2016-01-22 DIAGNOSIS — I2510 Atherosclerotic heart disease of native coronary artery without angina pectoris: Secondary | ICD-10-CM | POA: Diagnosis not present

## 2016-01-22 DIAGNOSIS — F17210 Nicotine dependence, cigarettes, uncomplicated: Secondary | ICD-10-CM | POA: Insufficient documentation

## 2016-01-22 DIAGNOSIS — R05 Cough: Secondary | ICD-10-CM | POA: Diagnosis not present

## 2016-01-22 DIAGNOSIS — Z7982 Long term (current) use of aspirin: Secondary | ICD-10-CM | POA: Diagnosis not present

## 2016-01-22 DIAGNOSIS — Z9049 Acquired absence of other specified parts of digestive tract: Secondary | ICD-10-CM | POA: Insufficient documentation

## 2016-01-22 DIAGNOSIS — F419 Anxiety disorder, unspecified: Secondary | ICD-10-CM | POA: Insufficient documentation

## 2016-01-22 DIAGNOSIS — I5032 Chronic diastolic (congestive) heart failure: Secondary | ICD-10-CM | POA: Diagnosis not present

## 2016-01-22 DIAGNOSIS — Z8673 Personal history of transient ischemic attack (TIA), and cerebral infarction without residual deficits: Secondary | ICD-10-CM | POA: Insufficient documentation

## 2016-01-22 DIAGNOSIS — M1712 Unilateral primary osteoarthritis, left knee: Secondary | ICD-10-CM | POA: Insufficient documentation

## 2016-01-22 DIAGNOSIS — J449 Chronic obstructive pulmonary disease, unspecified: Secondary | ICD-10-CM | POA: Insufficient documentation

## 2016-01-22 DIAGNOSIS — Z720 Tobacco use: Secondary | ICD-10-CM

## 2016-01-22 DIAGNOSIS — J4520 Mild intermittent asthma, uncomplicated: Secondary | ICD-10-CM

## 2016-01-22 DIAGNOSIS — I509 Heart failure, unspecified: Secondary | ICD-10-CM | POA: Insufficient documentation

## 2016-01-22 DIAGNOSIS — M329 Systemic lupus erythematosus, unspecified: Secondary | ICD-10-CM | POA: Insufficient documentation

## 2016-01-22 DIAGNOSIS — R200 Anesthesia of skin: Secondary | ICD-10-CM | POA: Insufficient documentation

## 2016-01-22 DIAGNOSIS — Y999 Unspecified external cause status: Secondary | ICD-10-CM | POA: Insufficient documentation

## 2016-01-22 DIAGNOSIS — Y939 Activity, unspecified: Secondary | ICD-10-CM | POA: Diagnosis not present

## 2016-01-22 DIAGNOSIS — K219 Gastro-esophageal reflux disease without esophagitis: Secondary | ICD-10-CM | POA: Insufficient documentation

## 2016-01-22 DIAGNOSIS — R059 Cough, unspecified: Secondary | ICD-10-CM

## 2016-01-22 DIAGNOSIS — R6882 Decreased libido: Secondary | ICD-10-CM | POA: Insufficient documentation

## 2016-01-22 DIAGNOSIS — G8929 Other chronic pain: Secondary | ICD-10-CM | POA: Insufficient documentation

## 2016-01-22 DIAGNOSIS — S3991XA Unspecified injury of abdomen, initial encounter: Secondary | ICD-10-CM | POA: Diagnosis present

## 2016-01-22 DIAGNOSIS — F329 Major depressive disorder, single episode, unspecified: Secondary | ICD-10-CM | POA: Insufficient documentation

## 2016-01-22 LAB — COMPREHENSIVE METABOLIC PANEL
ALT: 18 U/L (ref 14–54)
AST: 25 U/L (ref 15–41)
Albumin: 3 g/dL — ABNORMAL LOW (ref 3.5–5.0)
Alkaline Phosphatase: 86 U/L (ref 38–126)
Anion gap: 7 (ref 5–15)
BUN: 6 mg/dL (ref 6–20)
CO2: 28 mmol/L (ref 22–32)
Calcium: 8.7 mg/dL — ABNORMAL LOW (ref 8.9–10.3)
Chloride: 106 mmol/L (ref 101–111)
Creatinine, Ser: 0.77 mg/dL (ref 0.44–1.00)
GFR calc Af Amer: >60 mL/min (ref >60)
GFR calc non Af Amer: >60 mL/min (ref >60)
Glucose, Bld: 135 mg/dL — ABNORMAL HIGH (ref 65–99)
Potassium: 4 mmol/L (ref 3.5–5.1)
Sodium: 141 mmol/L (ref 135–145)
Total Bilirubin: 0.3 mg/dL (ref 0.3–1.2)
Total Protein: 6.2 g/dL — ABNORMAL LOW (ref 6.5–8.1)

## 2016-01-22 LAB — URINALYSIS, ROUTINE W REFLEX MICROSCOPIC
Bilirubin Urine: NEGATIVE
Glucose, UA: NEGATIVE mg/dL
Hgb urine dipstick: NEGATIVE
Ketones, ur: NEGATIVE mg/dL
Nitrite: NEGATIVE
Protein, ur: NEGATIVE mg/dL
Specific Gravity, Urine: 1.026 (ref 1.005–1.030)
pH: 7.5 (ref 5.0–8.0)

## 2016-01-22 LAB — CBC
HCT: 38.4 % (ref 36.0–46.0)
Hemoglobin: 12.6 g/dL (ref 12.0–15.0)
MCH: 27.9 pg (ref 26.0–34.0)
MCHC: 32.8 g/dL (ref 30.0–36.0)
MCV: 85.1 fL (ref 78.0–100.0)
Platelets: 250 10*3/uL (ref 150–400)
RBC: 4.51 MIL/uL (ref 3.87–5.11)
RDW: 14.4 % (ref 11.5–15.5)
WBC: 7.3 10*3/uL (ref 4.0–10.5)

## 2016-01-22 LAB — URINE MICROSCOPIC-ADD ON: RBC / HPF: NONE SEEN RBC/hpf (ref 0–5)

## 2016-01-22 LAB — LIPASE, BLOOD: Lipase: 14 U/L (ref 11–51)

## 2016-01-22 LAB — I-STAT TROPONIN, ED: Troponin i, poc: 0 ng/mL (ref 0.00–0.08)

## 2016-01-22 MED ORDER — IPRATROPIUM-ALBUTEROL 20-100 MCG/ACT IN AERS: 1.0000 | INHALATION_SPRAY | Freq: Four times a day (QID) | RESPIRATORY_TRACT | 0 refills | Status: DC

## 2016-01-22 MED ORDER — IPRATROPIUM-ALBUTEROL 0.5-2.5 (3) MG/3ML IN SOLN
3.0000 mL | Freq: Once | RESPIRATORY_TRACT | Status: AC
  Administered 2016-01-22: 3 mL via RESPIRATORY_TRACT
  Filled 2016-01-22: qty 3

## 2016-01-22 MED ORDER — DICYCLOMINE HCL 20 MG PO TABS
20.0000 mg | ORAL_TABLET | Freq: Once | ORAL | Status: AC
  Administered 2016-01-22: 20 mg via ORAL
  Filled 2016-01-22: qty 1

## 2016-01-22 MED ORDER — SODIUM CHLORIDE 0.9 % IV BOLUS (SEPSIS)
500.0000 mL | Freq: Once | INTRAVENOUS | Status: AC
  Administered 2016-01-22: 500 mL via INTRAVENOUS

## 2016-01-22 MED ORDER — NAPROXEN 375 MG PO TABS: 375.0000 mg | ORAL_TABLET | Freq: Two times a day (BID) | ORAL | 0 refills | Status: DC

## 2016-01-22 MED ORDER — SODIUM CHLORIDE 0.9 % IV BOLUS (SEPSIS)
30.0000 mL/kg | Freq: Once | INTRAVENOUS | Status: DC
  Filled 2016-01-22: qty 4200

## 2016-01-22 MED ORDER — DICYCLOMINE HCL 20 MG PO TABS: 20.0000 mg | ORAL_TABLET | Freq: Two times a day (BID) | ORAL | 0 refills | Status: DC

## 2016-01-22 NOTE — ED Notes (Signed)
Pt ambulated to restroom with no difficulty.

## 2016-01-22 NOTE — ED Notes (Signed)
Pt ambulated to restroom from room, tolerated well. 

## 2016-01-22 NOTE — ED Triage Notes (Signed)
Pt reports onset of generalized abd pain on Saturday. Denies n/v. Had diarrhea yesterday.

## 2016-01-22 NOTE — ED Notes (Signed)
Patient transported to X-ray 

## 2016-01-22 NOTE — Discharge Instructions (Addendum)
Your labs were without concerning abnormality. I believe the pain. You're having in her abdomen is secondary to strain from your coughing. He should discontinue smoking immediately. Smoking is a lung irritant and will not help him to stop coughing. It also has many other harmful effects on your body.  Use the naproxen and the Bentyl for cramping abdominal pain and discomfort. He may take an over-the-counter cough suppressant such as course side and Robitussin.  SEEK IMMEDIATE MEDICAL ATTENTION IF: The pain does not go away or becomes severe.  A temperature above 101 develops.  Repeated vomiting occurs (multiple episodes).  The pain becomes localized to portions of the abdomen. The right side could possibly be appendicitis. In an adult, the left lower portion of the abdomen could be colitis or diverticulitis.  Blood is being passed in stools or vomit (bright red or black tarry stools).  Return also if you develop chest pain, difficulty breathing, dizziness or fainting, or become confused, poorly responsive, or inconsolable (young children).

## 2016-01-22 NOTE — ED Provider Notes (Signed)
[facility] DEPT Provider Note   CSN: [id number] Arrival date & time: 01/22/16  [id number]     History   Chief Complaint Chief Complaint  Patient presents with  . Abdominal Pain  . Diarrhea    HPI Tammy Strickland is a 55 y.o. female with a history of congestive heart failure, COPD, diabetes, and smoking presents today for generalized abdominal pain x 5 days. She reports a nonproductive cough and increased wheezing starting at the same time as the abdominal pain. She thought she might have been constipated and so took a laxative 4 days ago, experienced diarrhea but no abdominal relief.  The pain is worse with coughing and palpation. Denies melena or bloody stools.She tried her inhaler, but it did not help so she stopped using it. History of multiple abdominal surgeries (cholecystectomy, appendectomy, hysterectomy). Denies fever, chills, nausea, vomiting, shortness of breath, chest pain, or urinary symptoms.   HPI  Past Medical History:  Diagnosis Date  . Anxiety   . Arthritis   . CHF (congestive heart failure) ([place])   . COPD (chronic obstructive pulmonary disease) ([place])   . Coronary artery disease   . Depression   . Diabetes mellitus without complication ([place])   . GERD (gastroesophageal reflux disease)   . Hypertension   . Lupus   . Neuromuscular disorder ([place])   . Osteoporosis   . Peripheral neuropathy ([place])   . Stroke [facility])     Patient Active Problem List   Diagnosis Date Noted  . Hyperlipidemia 01/15/2016  . Gait disorder 12/04/2015  . Diabetes ([place]) 11/26/2015  . Slurred speech 11/06/2015  . Chronic diastolic congestive heart failure ([place]) 11/06/2015  . TIA (transient ischemic attack)   . 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 Chest pain 10/31/2015  . Insomnia 10/10/2015  . Depression 10/10/2015  . Benign essential HTN 08/15/2015  . Fibromyalgia 08/15/2015  . Peripheral neuropathy ([place]) 08/15/2015  . Arthritis 08/15/2015  . CAD (coronary artery disease) 08/15/2015    . COPD (chronic obstructive pulmonary disease) ([place]) 08/15/2015  . Osteoporosis 08/15/2015  . 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 DDD (degenerative disc disease), lumbar 08/15/2015  . Occupational exposure to industrial toxins 08/15/2015  . GERD (gastroesophageal reflux disease) 08/15/2015    Past Surgical History:  Procedure Laterality Date  . ABDOMINAL HYSTERECTOMY    . APPENDECTOMY    . CHOLECYSTECTOMY    . IR GENERIC HISTORICAL  11/07/2015   IR ANGIO INTRA EXTRACRAN SEL COM CAROTID INNOMINATE BILAT MOD SED 11/07/2015 Luanne Bras, MD [facility]  . IR GENERIC HISTORICAL  11/07/2015   IR ANGIO VERTEBRAL SEL SUBCLAVIAN INNOMINATE UNI R MOD SED 11/07/2015 Luanne Bras, MD [facility]  . IR GENERIC HISTORICAL  11/07/2015   IR ANGIO VERTEBRAL SEL VERTEBRAL UNI L MOD SED 11/07/2015 Luanne Bras, MD [facility]  . IR GENERIC HISTORICAL  11/07/2015   IR ANGIOGRAM EXTREMITY LEFT 11/07/2015 Luanne Bras, MD [facility]  . TONSILLECTOMY      OB History    No data available       Home Medications    Prior to Admission medications   Medication Sig Start Date End Date Taking? Authorizing Provider  amitriptyline (ELAVIL) 25 MG tablet Take 2 tablets (50 mg total) by mouth at bedtime. 01/15/16   Binnie Rail, MD  aspirin EC 81 MG tablet Take 1 tablet (81 mg total) by mouth daily. 11/01/15   Silver Huguenin Elgergawy, MD  atorvastatin (LIPITOR) 20 MG tablet Take 1 tablet (20 mg total) by  mouth daily. 01/15/16   Binnie Rail, MD  Blood Glucose Monitoring Suppl (TRUE METRIX AIR GLUCOSE METER) DEVI 1 Device by Does not apply route daily. Use as directed twice daily 12/04/15   Biagio Borg, MD  cloNIDine (CATAPRES) 0.3 MG tablet Take 1 tablet (0.3 mg total) by mouth daily. Reported on 08/15/2015 11/08/15   Binnie Rail, MD  fluticasone (FLONASE) 50 MCG/ACT nasal spray Place 1 spray into both nostrils daily as needed for allergies.     Historical Provider, MD  furosemide (LASIX) 80 MG tablet  Take 1 tablet (80 mg total) by mouth daily. 11/08/15   Binnie Rail, MD  gabapentin (NEURONTIN) 800 MG tablet Take 1.5 tablets (1,200 mg total) by mouth 3 (three) times daily. 01/03/16   Ankit Lorie Phenix, MD  glucose blood (TRUE METRIX BLOOD GLUCOSE TEST) test strip Use as instructed twice per day 12/04/15   Biagio Borg, MD  Ipratropium-Albuterol (COMBIVENT) 20-100 MCG/ACT AERS respimat Inhale 1 puff into the lungs every 6 (six) hours. Patient taking differently: Inhale 1 puff into the lungs every 6 (six) hours as needed for wheezing or shortness of breath.  10/10/15   Binnie Rail, MD  Lancets MISC Use as directed twice per day 12/04/15   Biagio Borg, MD  losartan (COZAAR) 50 MG tablet Take 1 tablet (50 mg total) by mouth 2 (two) times daily. 12/24/15   Binnie Rail, MD  metFORMIN (GLUCOPHAGE-XR) 500 MG 24 hr tablet Take 1 tablet (500 mg total) by mouth 2 (two) times daily with a meal. 01/20/16   Binnie Rail, MD  metoprolol (TOPROL-XL) 200 MG 24 hr tablet TAKE 1 TABLET (200 MG TOTAL) BY MOUTH DAILY. 12/12/15   Binnie Rail, MD  mupirocin cream (BACTROBAN) 2 % Apply 1 application topically 2 (two) times daily. 11/26/15   Wendie Agreste, MD  nitroGLYCERIN (NITROSTAT) 0.4 MG SL tablet Place 0.4 mg under the tongue every 5 (five) minutes as needed for chest pain.    Historical Provider, MD  omeprazole (PRILOSEC) 20 MG capsule Take 1 capsule (20 mg total) by mouth daily. 01/15/16   Binnie Rail, MD  oxyCODONE-acetaminophen (PERCOCET) 10-325 MG tablet Take 1 tablet by mouth every 8 (eight) hours as needed for pain. 01/15/16   Binnie Rail, MD  potassium chloride SA (K-DUR,KLOR-CON) 20 MEQ tablet Take 1 tablet (20 mEq total) by mouth daily. Reported on 08/15/2015 10/10/15   Binnie Rail, MD  sulfamethoxazole-trimethoprim (BACTRIM DS,SEPTRA DS) 800-160 MG tablet Take 1 tablet by mouth 2 (two) times daily. 01/20/16   Binnie Rail, MD  tiZANidine (ZANAFLEX) 4 MG tablet Take 2.5 tablets (10 mg total) by mouth 3  (three) times daily. 01/15/16   Binnie Rail, MD    Family History Family History  Problem Relation Age of Onset  . Hyperlipidemia Mother   . Hypertension Mother   . Hyperlipidemia Father   . Hypertension Father   . Stroke Father   . Hypertension Sister   . Cancer Sister     breast cancer  . Crohn's disease Sister     Social History Social History  Substance Use Topics  . Smoking status: Current Every Day Smoker    Packs/day: 1.00    Years: 35.00    Types: Cigarettes  . Smokeless tobacco: Never Used     Comment: referred  to smoking  cessation  classes. at  [facility]   . Alcohol use No  Allergies   Review of patient's allergies indicates no known allergies.   Review of Systems Review of Systems  Ten systems reviewed and are negative for acute change, except as noted in the HPI.   Physical Exam Updated Vital Signs BP 126/72   Pulse (!) 57   Temp 98.1 F (36.7 C) (Oral)   Resp 24   Ht 5\' 5"  (1.651 m)   Wt (!) 137.4 kg   SpO2 100%   BMI 50.42 kg/m   Physical Exam  Constitutional: She is oriented to person, place, and time. She appears well-developed and well-nourished. No distress.  HENT:  Head: Normocephalic and atraumatic.  Eyes: Conjunctivae are normal. No scleral icterus.  Neck: Normal range of motion.  Cardiovascular: Normal rate, regular rhythm and normal heart sounds.  Exam reveals no gallop and no friction rub.   No murmur heard. Pulmonary/Chest: Effort normal. No respiratory distress. She has wheezes (mild expiratory wheeze).  Abdominal: Soft. Bowel sounds are normal. She exhibits no distension and no mass. There is tenderness. There is no guarding.    Neurological: She is alert and oriented to person, place, and time.  Skin: Skin is warm and dry. She is not diaphoretic.     ED Treatments / Results  Labs (all labs ordered are listed, but only abnormal results are displayed) Labs Reviewed  CBC  LIPASE, BLOOD  COMPREHENSIVE METABOLIC PANEL    URINALYSIS, ROUTINE W REFLEX MICROSCOPIC (NOT AT [facility])    EKG  EKG Interpretation None       Radiology No results found.  Procedures Procedures (including critical care time)  Medications Ordered in ED Medications - No data to display   Initial Impression / Assessment and Plan / ED Course  I have reviewed the triage vital signs and the nursing notes.  Pertinent labs & imaging results that were available during my care of the patient were reviewed by me and considered in my medical decision making (see chart for details).  Clinical Course    Patient with pyuria, few bacteria and squamous epithelia straight likely contamination of the urine. She has a urinary symptoms. No culture sent. Patient's abdominal pain appears to be muscular. Patient has pain that is worse with coughing and flexion of the abdominal wall. He is secondary to her URI. She has associated wheezing and bronchospasm. We'll discharge with Combivent inhaler, naproxen for abdominal wall pain and Bentyl. I discussed reasons to seek immediate medical care in the emergency department. Patient appears safe for discharge at this time  Final Clinical Impressions(s) / ED Diagnoses   Final diagnoses:  None    New Prescriptions New Prescriptions   No medications on file     Margarita Mail, PA-C 01/22/16 1737    Tanna Furry, MD 01/31/16 1339

## 2016-01-29 DIAGNOSIS — M1712 Unilateral primary osteoarthritis, left knee: Secondary | ICD-10-CM | POA: Diagnosis not present

## 2016-01-29 DIAGNOSIS — I2510 Atherosclerotic heart disease of native coronary artery without angina pectoris: Secondary | ICD-10-CM | POA: Diagnosis not present

## 2016-01-29 DIAGNOSIS — G894 Chronic pain syndrome: Secondary | ICD-10-CM | POA: Diagnosis not present

## 2016-01-29 DIAGNOSIS — M549 Dorsalgia, unspecified: Secondary | ICD-10-CM | POA: Diagnosis not present

## 2016-01-29 DIAGNOSIS — J449 Chronic obstructive pulmonary disease, unspecified: Secondary | ICD-10-CM | POA: Diagnosis not present

## 2016-01-29 DIAGNOSIS — I5032 Chronic diastolic (congestive) heart failure: Secondary | ICD-10-CM | POA: Diagnosis not present

## 2016-01-29 DIAGNOSIS — I10 Essential (primary) hypertension: Secondary | ICD-10-CM | POA: Diagnosis not present

## 2016-01-29 DIAGNOSIS — E119 Type 2 diabetes mellitus without complications: Secondary | ICD-10-CM | POA: Diagnosis not present

## 2016-01-29 DIAGNOSIS — M5136 Other intervertebral disc degeneration, lumbar region: Secondary | ICD-10-CM | POA: Diagnosis not present

## 2016-02-04 DIAGNOSIS — M549 Dorsalgia, unspecified: Secondary | ICD-10-CM | POA: Diagnosis not present

## 2016-02-04 DIAGNOSIS — M5136 Other intervertebral disc degeneration, lumbar region: Secondary | ICD-10-CM | POA: Diagnosis not present

## 2016-02-04 DIAGNOSIS — M1712 Unilateral primary osteoarthritis, left knee: Secondary | ICD-10-CM | POA: Diagnosis not present

## 2016-02-04 DIAGNOSIS — I5032 Chronic diastolic (congestive) heart failure: Secondary | ICD-10-CM | POA: Diagnosis not present

## 2016-02-04 DIAGNOSIS — J449 Chronic obstructive pulmonary disease, unspecified: Secondary | ICD-10-CM | POA: Diagnosis not present

## 2016-02-04 DIAGNOSIS — I10 Essential (primary) hypertension: Secondary | ICD-10-CM | POA: Diagnosis not present

## 2016-02-04 DIAGNOSIS — G894 Chronic pain syndrome: Secondary | ICD-10-CM | POA: Diagnosis not present

## 2016-02-04 DIAGNOSIS — I2510 Atherosclerotic heart disease of native coronary artery without angina pectoris: Secondary | ICD-10-CM | POA: Diagnosis not present

## 2016-02-04 DIAGNOSIS — E119 Type 2 diabetes mellitus without complications: Secondary | ICD-10-CM | POA: Diagnosis not present

## 2016-02-10 DIAGNOSIS — M5136 Other intervertebral disc degeneration, lumbar region: Secondary | ICD-10-CM | POA: Diagnosis not present

## 2016-02-10 DIAGNOSIS — Z8739 Personal history of other diseases of the musculoskeletal system and connective tissue: Secondary | ICD-10-CM | POA: Diagnosis not present

## 2016-02-10 DIAGNOSIS — G894 Chronic pain syndrome: Secondary | ICD-10-CM | POA: Diagnosis not present

## 2016-02-19 ENCOUNTER — Other Ambulatory Visit: Payer: Self-pay | Admitting: Internal Medicine

## 2016-02-19 DIAGNOSIS — I2510 Atherosclerotic heart disease of native coronary artery without angina pectoris: Secondary | ICD-10-CM | POA: Diagnosis not present

## 2016-02-19 DIAGNOSIS — I10 Essential (primary) hypertension: Secondary | ICD-10-CM

## 2016-02-19 DIAGNOSIS — M549 Dorsalgia, unspecified: Secondary | ICD-10-CM | POA: Diagnosis not present

## 2016-02-19 DIAGNOSIS — G894 Chronic pain syndrome: Secondary | ICD-10-CM | POA: Diagnosis not present

## 2016-02-19 DIAGNOSIS — E119 Type 2 diabetes mellitus without complications: Secondary | ICD-10-CM | POA: Diagnosis not present

## 2016-02-19 DIAGNOSIS — M1712 Unilateral primary osteoarthritis, left knee: Secondary | ICD-10-CM | POA: Diagnosis not present

## 2016-02-19 DIAGNOSIS — M5136 Other intervertebral disc degeneration, lumbar region: Secondary | ICD-10-CM | POA: Diagnosis not present

## 2016-02-19 DIAGNOSIS — I5032 Chronic diastolic (congestive) heart failure: Secondary | ICD-10-CM | POA: Diagnosis not present

## 2016-02-19 DIAGNOSIS — J449 Chronic obstructive pulmonary disease, unspecified: Secondary | ICD-10-CM | POA: Diagnosis not present

## 2016-02-20 ENCOUNTER — Encounter: Payer: Self-pay | Admitting: Internal Medicine

## 2016-02-20 ENCOUNTER — Ambulatory Visit (INDEPENDENT_AMBULATORY_CARE_PROVIDER_SITE_OTHER): Payer: Commercial Managed Care - HMO | Admitting: Internal Medicine

## 2016-02-20 ENCOUNTER — Other Ambulatory Visit: Payer: Self-pay | Admitting: *Deleted

## 2016-02-20 VITALS — BP 120/84 | HR 67 | Temp 98.2°F | Resp 16 | Ht 66.0 in | Wt 306.5 lb

## 2016-02-20 DIAGNOSIS — M5136 Other intervertebral disc degeneration, lumbar region: Secondary | ICD-10-CM | POA: Diagnosis not present

## 2016-02-20 DIAGNOSIS — Z79899 Other long term (current) drug therapy: Secondary | ICD-10-CM | POA: Diagnosis not present

## 2016-02-20 DIAGNOSIS — J42 Unspecified chronic bronchitis: Secondary | ICD-10-CM

## 2016-02-20 DIAGNOSIS — M1712 Unilateral primary osteoarthritis, left knee: Secondary | ICD-10-CM

## 2016-02-20 MED ORDER — TIZANIDINE HCL 4 MG PO TABS: 10.0000 mg | ORAL_TABLET | Freq: Three times a day (TID) | ORAL | 1 refills | Status: DC

## 2016-02-20 MED ORDER — TIZANIDINE HCL 4 MG PO TABS: 10.0000 mg | ORAL_TABLET | Freq: Three times a day (TID) | ORAL | 0 refills | Status: DC

## 2016-02-20 MED ORDER — OXYCODONE-ACETAMINOPHEN 10-325 MG PO TABS: 1.0000 | ORAL_TABLET | Freq: Three times a day (TID) | ORAL | 0 refills | Status: DC | PRN

## 2016-02-20 MED ORDER — IPRATROPIUM-ALBUTEROL 20-100 MCG/ACT IN AERS: 1.0000 | INHALATION_SPRAY | Freq: Four times a day (QID) | RESPIRATORY_TRACT | 1 refills | Status: DC

## 2016-02-20 MED ORDER — CYCLOBENZAPRINE HCL 10 MG PO TABS: ORAL_TABLET | ORAL | 0 refills | Status: DC

## 2016-02-20 MED ORDER — IPRATROPIUM-ALBUTEROL 20-100 MCG/ACT IN AERS
1.0000 | INHALATION_SPRAY | Freq: Four times a day (QID) | RESPIRATORY_TRACT | 1 refills | Status: DC
Start: 2016-02-20 — End: 2016-02-20

## 2016-02-20 MED ORDER — ATORVASTATIN CALCIUM 20 MG PO TABS: 20.0000 mg | ORAL_TABLET | Freq: Every day | ORAL | 2 refills | Status: DC

## 2016-02-20 MED FILL — OXYCODONE-APAP 10-325: 10-325 | 20 days supply | Qty: 60 | Fill #0

## 2016-02-20 NOTE — Progress Notes (Signed)
Subjective:  Patient ID: Tammy Strickland, female    DOB: 01-Nov-1960  Age: 55 y.o. MRN: [id number]  CC: Hypertension; Back Pain; and Osteoarthritis   HPI Tammy Strickland presents for a prescription refill for chronic pain management. She has chronic low back pain and knee pain. She tells me she is seeing an interventional specialist and is receiving epidural steroids in her lower back. She would benefit from knee surgery but her orthopedic surgeon has asked her to lose some weight first. She gets adequate symptom relief with the current dose of oxycodone. She also needs a refill of Combivent for COPD. She tells me it works well in helping control her cough and wheezing. She has had no recent symptoms of exacerbation such as shortness of breath, hemoptysis, productive cough, fever, chills.  Outpatient Medications Prior to Visit  Medication Sig Dispense Refill  . amitriptyline (ELAVIL) 25 MG tablet Take 2 tablets (50 mg total) by mouth at bedtime. 60 tablet 5  . aspirin EC 81 MG tablet Take 1 tablet (81 mg total) by mouth daily.    . Blood Glucose Monitoring Suppl (TRUE METRIX AIR GLUCOSE METER) DEVI 1 Device by Does not apply route daily. Use as directed twice daily 1 Device 0  . cloNIDine (CATAPRES) 0.3 MG tablet Take 1 tablet (0.3 mg total) by mouth daily. Reported on 08/15/2015 90 tablet 1  . fluticasone (FLONASE) 50 MCG/ACT nasal spray Place 1 spray into both nostrils daily as needed for allergies.     . furosemide (LASIX) 80 MG tablet Take 1 tablet (80 mg total) by mouth daily. 90 tablet 1  . gabapentin (NEURONTIN) 800 MG tablet Take 1.5 tablets (1,200 mg total) by mouth 3 (three) times daily. 135 tablet 1  . glucose blood (TRUE METRIX BLOOD GLUCOSE TEST) test strip Use as instructed twice per day 200 each 12  . Lancets MISC Use as directed twice per day 200 each 11  . losartan (COZAAR) 50 MG tablet Take 1 tablet (50 mg total) by mouth 2 (two) times daily. 180 tablet 2  . metFORMIN  (GLUCOPHAGE-XR) 500 MG 24 hr tablet Take 1 tablet (500 mg total) by mouth 2 (two) times daily with a meal. 180 tablet 1  . metoprolol (TOPROL-XL) 200 MG 24 hr tablet TAKE 1 TABLET (200 MG TOTAL) BY MOUTH DAILY. 90 tablet 2  . omeprazole (PRILOSEC) 20 MG capsule Take 1 capsule (20 mg total) by mouth daily. 90 capsule 3  . potassium chloride SA (K-DUR,KLOR-CON) 20 MEQ tablet TAKE 1 TABLET (20 MEQ TOTAL) BY MOUTH DAILY. 90 tablet 1  . atorvastatin (LIPITOR) 20 MG tablet Take 1 tablet (20 mg total) by mouth daily. 90 tablet 3  . Ipratropium-Albuterol (COMBIVENT) 20-100 MCG/ACT AERS respimat Inhale 1 puff into the lungs every 6 (six) hours. 1 Inhaler 0  . oxyCODONE-acetaminophen (PERCOCET) 10-325 MG tablet Take 1 tablet by mouth every 8 (eight) hours as needed for pain. 60 tablet 0  . naproxen (NAPROSYN) 375 MG tablet Take 1 tablet (375 mg total) by mouth 2 (two) times daily. (Patient not taking: Reported on 02/20/2016) 20 tablet 0  . nitroGLYCERIN (NITROSTAT) 0.4 MG SL tablet Place 0.4 mg under the tongue every 5 (five) minutes as needed for chest pain.    Marland Kitchen sulfamethoxazole-trimethoprim (BACTRIM DS,SEPTRA DS) 800-160 MG tablet Take 1 tablet by mouth 2 (two) times daily. (Patient not taking: Reported on 02/20/2016) 20 tablet 0  . dicyclomine (BENTYL) 20 MG tablet Take 1 tablet (20 mg total) by mouth  2 (two) times daily. 20 tablet 0  . mupirocin cream (BACTROBAN) 2 % Apply 1 application topically 2 (two) times daily. 15 g 0  . tiZANidine (ZANAFLEX) 4 MG tablet Take 2.5 tablets (10 mg total) by mouth 3 (three) times daily. 225 tablet 3   No facility-administered medications prior to visit.     ROS Review of Systems  Constitutional: Negative.  Negative for activity change, appetite change, diaphoresis, fatigue and fever.  HENT: Negative.   Eyes: Negative for visual disturbance.  Respiratory: Positive for wheezing. Negative for cough, chest tightness, shortness of breath and stridor.   Cardiovascular:  Negative.  Negative for chest pain, palpitations and leg swelling.  Gastrointestinal: Negative.  Negative for abdominal pain, constipation, diarrhea, nausea and vomiting.  Endocrine: Negative.   Genitourinary: Negative.   Musculoskeletal: Positive for arthralgias and back pain. Negative for joint swelling, myalgias and neck pain.  Skin: Negative.   Neurological: Negative.   Hematological: Negative.  Negative for adenopathy. Does not bruise/bleed easily.  Psychiatric/Behavioral: Negative.  Negative for sleep disturbance and suicidal ideas. The patient is not nervous/anxious.     Objective:  BP 120/84 (BP Location: Left Arm, Patient Position: Sitting, Cuff Size: Large)   Pulse 67   Temp 98.2 F (36.8 C) (Oral)   Resp 16   Ht 5\' 6"  (1.676 m)   Wt (!) 306 lb 8 oz (139 kg)   SpO2 94%   BMI 49.47 kg/m   BP Readings from Last 3 Encounters:  02/20/16 120/84  01/22/16 127/72  01/15/16 (!) 142/94    Wt Readings from Last 3 Encounters:  02/20/16 (!) 306 lb 8 oz (139 kg)  01/22/16 (!) 303 lb (137.4 kg)  01/15/16 (!) 303 lb (137.4 kg)    Physical Exam  Constitutional: She is oriented to person, place, and time. No distress.  HENT:  Mouth/Throat: Oropharynx is clear and moist. No oropharyngeal exudate.  Eyes: Conjunctivae are normal. Right eye exhibits no discharge. Left eye exhibits no discharge. No scleral icterus.  Neck: Normal range of motion. Neck supple. No JVD present. No tracheal deviation present. No thyromegaly present.  Cardiovascular: Normal rate, regular rhythm, normal heart sounds and intact distal pulses.  Exam reveals no gallop and no friction rub.   No murmur heard. Pulmonary/Chest: Effort normal and breath sounds normal. No stridor. No respiratory distress. She has no wheezes. She has no rales. She exhibits no tenderness.  Abdominal: Soft. Bowel sounds are normal. She exhibits no distension and no mass. There is no tenderness. There is no rebound and no guarding.    Musculoskeletal: Normal range of motion. She exhibits no edema, tenderness or deformity.  Lymphadenopathy:    She has no cervical adenopathy.  Neurological: She is oriented to person, place, and time.  Skin: Skin is warm and dry. No rash noted. She is not diaphoretic. No erythema. No pallor.  Vitals reviewed.   Lab Results  Component Value Date   WBC 7.3 01/22/2016   HGB 12.6 01/22/2016   HCT 38.4 01/22/2016   PLT 250 01/22/2016   GLUCOSE 135 (H) 01/22/2016   CHOL 183 11/07/2015   TRIG 165 (H) 11/07/2015   HDL 52 11/07/2015   LDLCALC 98 11/07/2015   ALT 18 01/22/2016   AST 25 01/22/2016   NA 141 01/22/2016   K 4.0 01/22/2016   CL 106 01/22/2016   CREATININE 0.77 01/22/2016   BUN 6 01/22/2016   CO2 28 01/22/2016   INR 1.06 11/07/2015   HGBA1C 8.0 (H)  01/15/2016   MICROALBUR <0.7 01/15/2016    Dg Chest 2 View  Result Date: 01/22/2016 CLINICAL DATA:  Cough, wheezing abdominal pain for 5 days. History of smoking and COPD. EXAM: CHEST  2 VIEW COMPARISON:  12/02/2015 FINDINGS: Cardiac silhouette is top-normal in size. No mediastinal or hilar masses or convincing adenopathy. There are prominent bronchovascular markings accentuated by relatively low lung volumes and the semi-erect positioning. No overt pulmonary edema. No lung consolidation is seen to suggest pneumonia. No pleural effusion or pneumothorax. Skeletal structures are intact. IMPRESSION: 1. No acute cardiopulmonary disease. Electronically Signed   By: Lajean Manes M.D.   On: 01/22/2016 12:16    Assessment & Plan:   Chalese was seen today for hypertension, back pain and osteoarthritis.  Diagnoses and all orders for this visit:  DDD (degenerative disc disease), lumbar -     oxyCODONE-acetaminophen (PERCOCET) 10-325 MG tablet; Take 1 tablet by mouth every 8 (eight) hours as needed for pain. -     tiZANidine (ZANAFLEX) 4 MG tablet; Take 2.5 tablets (10 mg total) by mouth 3 (three) times daily.  Primary osteoarthritis  of left knee -     oxyCODONE-acetaminophen (PERCOCET) 10-325 MG tablet; Take 1 tablet by mouth every 8 (eight) hours as needed for pain.  Chronic bronchitis, unspecified chronic bronchitis type ([place]) -     Discontinue: Ipratropium-Albuterol (COMBIVENT) 20-100 MCG/ACT AERS respimat; Inhale 1 puff into the lungs every 6 (six) hours.   I have discontinued Ms. Lenis's mupirocin cream, dicyclomine, and Ipratropium-Albuterol. I am also having her maintain her fluticasone, aspirin EC, nitroGLYCERIN, cloNIDine, furosemide, TRUE METRIX AIR GLUCOSE METER, glucose blood, Lancets, metoprolol, losartan, gabapentin, amitriptyline, omeprazole, metFORMIN, sulfamethoxazole-trimethoprim, naproxen, potassium chloride SA, oxyCODONE-acetaminophen, and tiZANidine.  Meds ordered this encounter  Medications  . oxyCODONE-acetaminophen (PERCOCET) 10-325 MG tablet    Sig: Take 1 tablet by mouth every 8 (eight) hours as needed for pain.    Dispense:  60 tablet    Refill:  0  . tiZANidine (ZANAFLEX) 4 MG tablet    Sig: Take 2.5 tablets (10 mg total) by mouth 3 (three) times daily.    Dispense:  270 tablet    Refill:  1  . DISCONTD: Ipratropium-Albuterol (COMBIVENT) 20-100 MCG/ACT AERS respimat    Sig: Inhale 1 puff into the lungs every 6 (six) hours.    Dispense:  3 Inhaler    Refill:  1     Follow-up: Return if symptoms worsen or fail to improve.  Scarlette Calico, MD

## 2016-02-20 NOTE — Progress Notes (Signed)
Pre visit review using our clinic review tool, if applicable. No additional management support is needed unless otherwise documented below in the visit note. 

## 2016-02-20 NOTE — Addendum Note (Signed)
Addended by: Earnstine Regal on: 02/20/2016 11:37 AM   Modules accepted: Orders

## 2016-02-20 NOTE — Patient Instructions (Signed)

## 2016-02-25 ENCOUNTER — Telehealth: Payer: Self-pay

## 2016-02-25 NOTE — Telephone Encounter (Signed)
Received fax from [facility] requesting clarification of recent prescriptions received.    Rx for cyclobenzaprine and tizanidine were rxed on 02/20/2016 with Dr. Ronnald Ramp. MD only rx'ed tizanidine during t he OV. Cyclobenzaprine was sent with Dr. Ronnald Ramp' name.   PCP, Dr. Quay Burow, signed [facility] fax stating that cyclobenzaprine was to be discontinued.

## 2016-02-27 DIAGNOSIS — M5136 Other intervertebral disc degeneration, lumbar region: Secondary | ICD-10-CM | POA: Diagnosis not present

## 2016-02-27 DIAGNOSIS — G8929 Other chronic pain: Secondary | ICD-10-CM | POA: Diagnosis not present

## 2016-02-27 DIAGNOSIS — M25562 Pain in left knee: Secondary | ICD-10-CM | POA: Diagnosis not present

## 2016-02-27 DIAGNOSIS — G894 Chronic pain syndrome: Secondary | ICD-10-CM | POA: Diagnosis not present

## 2016-03-03 DIAGNOSIS — J449 Chronic obstructive pulmonary disease, unspecified: Secondary | ICD-10-CM | POA: Diagnosis not present

## 2016-03-03 DIAGNOSIS — M5136 Other intervertebral disc degeneration, lumbar region: Secondary | ICD-10-CM | POA: Diagnosis not present

## 2016-03-03 DIAGNOSIS — E119 Type 2 diabetes mellitus without complications: Secondary | ICD-10-CM | POA: Diagnosis not present

## 2016-03-03 DIAGNOSIS — G894 Chronic pain syndrome: Secondary | ICD-10-CM | POA: Diagnosis not present

## 2016-03-03 DIAGNOSIS — I2510 Atherosclerotic heart disease of native coronary artery without angina pectoris: Secondary | ICD-10-CM | POA: Diagnosis not present

## 2016-03-03 DIAGNOSIS — M549 Dorsalgia, unspecified: Secondary | ICD-10-CM | POA: Diagnosis not present

## 2016-03-03 DIAGNOSIS — I10 Essential (primary) hypertension: Secondary | ICD-10-CM | POA: Diagnosis not present

## 2016-03-03 DIAGNOSIS — M1712 Unilateral primary osteoarthritis, left knee: Secondary | ICD-10-CM | POA: Diagnosis not present

## 2016-03-03 DIAGNOSIS — I5032 Chronic diastolic (congestive) heart failure: Secondary | ICD-10-CM | POA: Diagnosis not present

## 2016-03-04 ENCOUNTER — Telehealth: Payer: Self-pay | Admitting: Emergency Medicine

## 2016-03-04 NOTE — Telephone Encounter (Signed)
Pt called and needs a referral to [facility]. The phone number is [phone] and the fax number is [phone]. They are located at [address], [address] [place], [place] [address]. She has an appt for 11/29 at 9:00 am. Please advise thanks.

## 2016-03-04 NOTE — Telephone Encounter (Signed)
Ok.  Is it for knee pain?

## 2016-03-04 NOTE — Telephone Encounter (Signed)
Please advise 

## 2016-03-10 ENCOUNTER — Telehealth: Payer: Self-pay | Admitting: Internal Medicine

## 2016-03-10 NOTE — Telephone Encounter (Signed)
Left msg on pt's vm stating the referral has been done.

## 2016-03-10 NOTE — Telephone Encounter (Signed)
Patient is needing a new referral ([facility]) to [facility]- Dr Earleen Newport. She does not have an appt scheduled at this time.

## 2016-03-11 ENCOUNTER — Ambulatory Visit (INDEPENDENT_AMBULATORY_CARE_PROVIDER_SITE_OTHER): Payer: Commercial Managed Care - HMO | Admitting: Internal Medicine

## 2016-03-11 ENCOUNTER — Encounter: Payer: Self-pay | Admitting: Internal Medicine

## 2016-03-11 VITALS — BP 128/82 | HR 64 | Temp 98.3°F | Resp 18 | Ht 66.0 in | Wt 310.0 lb

## 2016-03-11 DIAGNOSIS — M5136 Other intervertebral disc degeneration, lumbar region: Secondary | ICD-10-CM | POA: Diagnosis not present

## 2016-03-11 DIAGNOSIS — G8929 Other chronic pain: Secondary | ICD-10-CM | POA: Diagnosis not present

## 2016-03-11 DIAGNOSIS — M1712 Unilateral primary osteoarthritis, left knee: Secondary | ICD-10-CM

## 2016-03-11 DIAGNOSIS — M1990 Unspecified osteoarthritis, unspecified site: Secondary | ICD-10-CM | POA: Diagnosis not present

## 2016-03-11 MED ORDER — FLUTICASONE PROPIONATE 50 MCG/ACT NA SUSP: 1.0000 | Freq: Every day | NASAL | 8 refills | Status: DC | PRN

## 2016-03-11 MED ORDER — TRAZODONE HCL 50 MG PO TABS: 50.0000 mg | ORAL_TABLET | Freq: Every day | ORAL | 5 refills | Status: DC

## 2016-03-11 MED ORDER — OXYCODONE-ACETAMINOPHEN 10-325 MG PO TABS: 1.0000 | ORAL_TABLET | Freq: Three times a day (TID) | ORAL | 0 refills | Status: DC | PRN

## 2016-03-11 MED ORDER — AMITRIPTYLINE HCL 25 MG PO TABS: 50.0000 mg | ORAL_TABLET | Freq: Every day | ORAL | 5 refills | Status: DC

## 2016-03-11 NOTE — Progress Notes (Signed)
Subjective:    Patient ID: Tammy Strickland, female    DOB: May 14, 1960, 55 y.o.   MRN: [id number]  HPI She is here for an acute visit.   Knee pain, bilateral:  She has an appointment in January with her orthopedic for follow up.  He wants her to lose 50 lbs before knee replacement surgery.  She is not eating much and is unsure why she keeps gaining weight.  She has been taking the oxycodone for the pain and it helps.  She does not want to see the same pain management doctor she did - she would like a referral to a new one.  She does need a refill of her pain medication - she takes it 2-3 times a day.    Back pain;  She saw orthopedics.  They want to do injections before considering surgery.  She will try the injections, but is concerned because the injections are painful.  Her back is in constant severe pain.  She did see the pain management doctor and does not want to go back to the same one - she did not feel the alternative treatments he tried were helpful. She is taking the tizanidine at night and during the day.   Insomnia;  She is taking the trazodone night with the elavil.  She   Medications and allergies reviewed with patient and updated if appropriate.  Patient Active Problem List   Diagnosis Date Noted  . Hyperlipidemia 01/15/2016  . Gait disorder 12/04/2015  . Diabetes ([place]) 11/26/2015  . Slurred speech 11/06/2015  . Chronic diastolic congestive heart failure ([place]) 11/06/2015  . TIA (transient ischemic attack)   . 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 Chest pain 10/31/2015  . Insomnia 10/10/2015  . Depression 10/10/2015  . Benign essential HTN 08/15/2015  . Fibromyalgia 08/15/2015  . Peripheral neuropathy ([place]) 08/15/2015  . Arthritis 08/15/2015  . CAD (coronary artery disease) 08/15/2015  . COPD (chronic obstructive pulmonary disease) ([place]) 08/15/2015  . Osteoporosis 08/15/2015  . 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 DDD (degenerative disc disease), lumbar 08/15/2015  .  Occupational exposure to industrial toxins 08/15/2015  . GERD (gastroesophageal reflux disease) 08/15/2015    Current Outpatient Prescriptions on File Prior to Visit  Medication Sig Dispense Refill  . amitriptyline (ELAVIL) 25 MG tablet Take 2 tablets (50 mg total) by mouth at bedtime. 60 tablet 5  . aspirin EC 81 MG tablet Take 1 tablet (81 mg total) by mouth daily.    Marland Kitchen atorvastatin (LIPITOR) 20 MG tablet Take 1 tablet (20 mg total) by mouth daily. 90 tablet 2  . Blood Glucose Monitoring Suppl (TRUE METRIX AIR GLUCOSE METER) DEVI 1 Device by Does not apply route daily. Use as directed twice daily 1 Device 0  . cloNIDine (CATAPRES) 0.3 MG tablet Take 1 tablet (0.3 mg total) by mouth daily. Reported on 08/15/2015 90 tablet 1  . cyclobenzaprine (FLEXERIL) 10 MG tablet TAKE 1 TABLET BY MOUTH 2 TIMES DAILY AS NEEDED FOR MUSCLE SPASMS 90 tablet 0  . fluticasone (FLONASE) 50 MCG/ACT nasal spray Place 1 spray into both nostrils daily as needed for allergies.     . furosemide (LASIX) 80 MG tablet Take 1 tablet (80 mg total) by mouth daily. 90 tablet 1  . gabapentin (NEURONTIN) 800 MG tablet Take 1.5 tablets (1,200 mg total) by mouth 3 (three) times daily. 135 tablet 1  . glucose blood (TRUE METRIX BLOOD GLUCOSE TEST) test strip Use as instructed twice per day 200  each 12  . Ipratropium-Albuterol (COMBIVENT) 20-100 MCG/ACT AERS respimat Inhale 1 puff into the lungs every 6 (six) hours. 3 Inhaler 1  . Lancets MISC Use as directed twice per day 200 each 11  . losartan (COZAAR) 50 MG tablet Take 1 tablet (50 mg total) by mouth 2 (two) times daily. 180 tablet 2  . metFORMIN (GLUCOPHAGE-XR) 500 MG 24 hr tablet Take 1 tablet (500 mg total) by mouth 2 (two) times daily with a meal. 180 tablet 1  . metoprolol (TOPROL-XL) 200 MG 24 hr tablet TAKE 1 TABLET (200 MG TOTAL) BY MOUTH DAILY. 90 tablet 2  . naproxen (NAPROSYN) 375 MG tablet Take 1 tablet (375 mg total) by mouth 2 (two) times daily. 20 tablet 0  .  nitroGLYCERIN (NITROSTAT) 0.4 MG SL tablet Place 0.4 mg under the tongue every 5 (five) minutes as needed for chest pain.    Marland Kitchen omeprazole (PRILOSEC) 20 MG capsule Take 1 capsule (20 mg total) by mouth daily. 90 capsule 3  . oxyCODONE-acetaminophen (PERCOCET) 10-325 MG tablet Take 1 tablet by mouth every 8 (eight) hours as needed for pain. 60 tablet 0  . potassium chloride SA (K-DUR,KLOR-CON) 20 MEQ tablet TAKE 1 TABLET (20 MEQ TOTAL) BY MOUTH DAILY. 90 tablet 1  . sulfamethoxazole-trimethoprim (BACTRIM DS,SEPTRA DS) 800-160 MG tablet Take 1 tablet by mouth 2 (two) times daily. 20 tablet 0  . tiZANidine (ZANAFLEX) 4 MG tablet Take 2.5 tablets (10 mg total) by mouth 3 (three) times daily. 270 tablet 1   No current facility-administered medications on file prior to visit.     Past Medical History:  Diagnosis Date  . Anxiety   . Arthritis   . CHF (congestive heart failure) ([place])   . COPD (chronic obstructive pulmonary disease) ([place])   . Coronary artery disease   . Depression   . Diabetes mellitus without complication ([place])   . GERD (gastroesophageal reflux disease)   . Hypertension   . Lupus   . Neuromuscular disorder ([employer])   . Osteoporosis   . Peripheral neuropathy ([place])   . Stroke [facility])     Past Surgical History:  Procedure Laterality Date  . ABDOMINAL HYSTERECTOMY    . APPENDECTOMY    . CHOLECYSTECTOMY    . IR GENERIC HISTORICAL  11/07/2015   IR ANGIO INTRA EXTRACRAN SEL COM CAROTID INNOMINATE BILAT MOD SED 11/07/2015 Luanne Bras, MD [facility]  . IR GENERIC HISTORICAL  11/07/2015   IR ANGIO VERTEBRAL SEL SUBCLAVIAN INNOMINATE UNI R MOD SED 11/07/2015 Luanne Bras, MD [facility]  . IR GENERIC HISTORICAL  11/07/2015   IR ANGIO VERTEBRAL SEL VERTEBRAL UNI L MOD SED 11/07/2015 Luanne Bras, MD [facility]  . IR GENERIC HISTORICAL  11/07/2015   IR ANGIOGRAM EXTREMITY LEFT 11/07/2015 Luanne Bras, MD [facility]  . TONSILLECTOMY      Social History    Social History  . Marital status: Single    Spouse name: n/a  . Number of children: 3  . Years of education: 12+   Occupational History  . 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 Smoking status: Current Every Day Smoker    Packs/day: 1.00    Years: 35.00    Types: Cigarettes  . Smokeless tobacco: Never Used     Comment: referred  to smoking  cessation  classes. at  [facility]   . Alcohol use No  . 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   Marland Kitchen Sexual activity: Yes    Partners: Female   Other Topics Concern  . None   Social History Narrative   Moved to [place] from [place], [place] February 2017, to help her daughter.   Lives with her daughter.   Sons live in [place] and [place].   She reports that there were originally 17 children in her family (she is the youngest), the oldest are deceased, some prior to her birth, and she isn't sure which were female/female or how they died.    Family History  Problem Relation Age of Onset  . Hyperlipidemia Mother   . Hypertension Mother   . Hyperlipidemia Father   . Hypertension Father   . Stroke Father   . Hypertension Sister   . Cancer Sister     breast cancer  . Crohn's disease Sister     Review of Systems  Constitutional: Positive for fever (occ).  HENT: Positive for congestion and tinnitus. Negative for ear pain (ears plugged), sinus pressure, sneezing and sore throat.   Respiratory: Positive for wheezing. Negative for cough and shortness of breath.   Cardiovascular: Positive for leg swelling. Negative for chest pain.  Musculoskeletal: Positive for arthralgias and back pain.  Neurological: Positive for light-headedness and headaches.       Objective:   Vitals:   03/11/16 1513  BP: 128/82  Pulse: 64  Resp: 18  Temp: 98.3 F (36.8 C)   Filed Weights   03/11/16 1513  Weight: (!) 310 lb (140.6 kg)   Body mass index is 50.04  kg/m.   Physical Exam GENERAL APPEARANCE: Appears stated age, well appearing, NAD EYES: conjunctiva clear, no icterus HEENT: bilateral tympanic membranes and ear canals normal, oropharynx with no erythema, no thyromegaly, trachea midline, no cervical or supraclavicular lymphadenopathy LUNGS: Clear to auscultation without wheeze or crackles, unlabored breathing, good air entry bilaterally HEART: Normal S1,S2 without murmurs EXTREMITIES: Without clubbing, cyanosis, or edema      Assessment & Plan:   See Problem List for Assessment and Plan of chronic medical problems.  F/u in 3 months - pain management and routine f/u

## 2016-03-11 NOTE — Patient Instructions (Addendum)
    No immunizations administered today.   Medications reviewed and updated.  No changes recommended at this time.  Your prescription(s) have been submitted to your pharmacy. Please take as directed and contact our office if you believe you are having problem(s) with the medication(s).   Please followup in 3 months   

## 2016-03-12 DIAGNOSIS — Z01818 Encounter for other preprocedural examination: Secondary | ICD-10-CM | POA: Insufficient documentation

## 2016-03-12 DIAGNOSIS — G8929 Other chronic pain: Secondary | ICD-10-CM | POA: Insufficient documentation

## 2016-03-12 NOTE — Assessment & Plan Note (Signed)
Will receive injections by ortho Percocet refilled today - see pain management encounter Will f/u in 3 months

## 2016-03-12 NOTE — Assessment & Plan Note (Signed)
Following with ortho - needs TKR, but needs to lose weight first and has not been successful Work on weight loss Will continue percocet but will need to see pain management for long term pain management Refill given

## 2016-03-12 NOTE — Assessment & Plan Note (Addendum)
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  No alcohol with any of her medications.  Discussed that only I can prescribe her pain medication - can not get pain medication from any other doctors - she states she understands Follow up in 3 months

## 2016-03-12 NOTE — Telephone Encounter (Signed)
Pt was seen in office on 03/11/16. New referrals placed.

## 2016-03-23 ENCOUNTER — Telehealth: Payer: Self-pay | Admitting: *Deleted

## 2016-03-23 NOTE — Telephone Encounter (Signed)
Pt states that she is going to [facility] for her knee but needs Dr Brigitte Pulse to send over referral to same   Best phone is [phone]

## 2016-03-25 DIAGNOSIS — M47816 Spondylosis without myelopathy or radiculopathy, lumbar region: Secondary | ICD-10-CM | POA: Diagnosis not present

## 2016-03-26 ENCOUNTER — Telehealth: Payer: Self-pay | Admitting: Internal Medicine

## 2016-03-26 DIAGNOSIS — M25562 Pain in left knee: Secondary | ICD-10-CM

## 2016-03-26 DIAGNOSIS — M545 Low back pain: Principal | ICD-10-CM

## 2016-03-26 DIAGNOSIS — G8929 Other chronic pain: Secondary | ICD-10-CM

## 2016-03-26 DIAGNOSIS — M25561 Pain in right knee: Secondary | ICD-10-CM

## 2016-03-26 NOTE — Telephone Encounter (Signed)
ordered

## 2016-03-26 NOTE — Telephone Encounter (Signed)
Please advise 

## 2016-03-26 NOTE — Telephone Encounter (Signed)
Patient states she wants Dr. Quay Burow to refer her over the pain clinic. She has pain in her knees back and just all over.  [facility] is where she wants to go. [phone]  Thank you.

## 2016-03-27 ENCOUNTER — Ambulatory Visit (INDEPENDENT_AMBULATORY_CARE_PROVIDER_SITE_OTHER): Payer: Commercial Managed Care - HMO | Admitting: Podiatry

## 2016-03-27 ENCOUNTER — Encounter: Payer: Self-pay | Admitting: Podiatry

## 2016-03-27 DIAGNOSIS — M79674 Pain in right toe(s): Secondary | ICD-10-CM | POA: Diagnosis not present

## 2016-03-27 DIAGNOSIS — M79675 Pain in left toe(s): Secondary | ICD-10-CM

## 2016-03-27 DIAGNOSIS — M19079 Primary osteoarthritis, unspecified ankle and foot: Secondary | ICD-10-CM

## 2016-03-27 DIAGNOSIS — E1149 Type 2 diabetes mellitus with other diabetic neurological complication: Secondary | ICD-10-CM | POA: Diagnosis not present

## 2016-03-27 DIAGNOSIS — M2142 Flat foot [pes planus] (acquired), left foot: Secondary | ICD-10-CM

## 2016-03-27 DIAGNOSIS — M2141 Flat foot [pes planus] (acquired), right foot: Secondary | ICD-10-CM

## 2016-03-27 DIAGNOSIS — B351 Tinea unguium: Secondary | ICD-10-CM

## 2016-03-30 NOTE — Telephone Encounter (Signed)
I have never seen this pt.  I recommend she call her PCP office.

## 2016-03-31 ENCOUNTER — Telehealth: Payer: Self-pay | Admitting: *Deleted

## 2016-03-31 DIAGNOSIS — G629 Polyneuropathy, unspecified: Secondary | ICD-10-CM

## 2016-03-31 NOTE — Telephone Encounter (Addendum)
-----   Message from Trula Slade, DPM sent at 03/30/2016 12:16 PM EST ----- Can you please put in for a neurology consult due to neuropathy and she has tried numerous medications? Thanks. 03/31/2016-Faxed referral, clinicals and demographics to [facility]rology.

## 2016-03-31 NOTE — Telephone Encounter (Signed)
[facility] doesn't participate with Medicaid. We should not be listed as PCP on her card.  I believe that Dr. Billey Gosling ([place] [facility]) is her PCP.

## 2016-03-31 NOTE — Telephone Encounter (Signed)
Spoke to pt who reported that Dr Eilleen Kempf office had advised her that whoever she saw first for her knee problem should make referral. I explained that since we are not Medicaid providers and can not even be listed as a PCP on Medicaid card. Medicaid rules specify that whatever MD is listed on their card has to be the one to do referral. Pt agreed and will call back Dr Quay Burow office.

## 2016-03-31 NOTE — Telephone Encounter (Signed)
After looking in pts chart and speaking with pt, we realized there was a communication error Pt was seen by CHELLE in may/2017 and she was told that [facility] must place a referral for her since we are listed as her PCP on her medicaid card.  Please review and contact pt.

## 2016-04-02 NOTE — Telephone Encounter (Signed)
Done

## 2016-04-02 NOTE — Progress Notes (Signed)
Subjective: 55 y.o. returns the office today for painful, elongated, thickened toenails which she cannot trim herself. Denies any redness or drainage around the nails. She also states she is still getting numbness, burning and tingling pain to her feet. She has attempted multiple oral medications as well as topical without any significant relief. Denies any acute changes since last appointment and no new complaints today. Denies any systemic complaints such as fevers, chills, nausea, vomiting.   Objective: AAO 3, NAD DP/PT pulses palpable, CRT less than 3 seconds Protective sensation decreased with Simms Weinstein monofilament Nails hypertrophic, dystrophic, elongated, brittle, discolored 10. There is tenderness overlying the nails 1-5 bilaterally. There is no surrounding erythema or drainage along the nail sites. No open lesions or pre-ulcerative lesions are identified. No other areas of tenderness bilateral lower extremities. No overlying edema, erythema, increased warmth. No pain with calf compression, swelling, warmth, erythema.  Assessment: Patient presents with symptomatic onychomycosis; symptomatic neuropathy   Plan: -Treatment options including alternatives, risks, complications were discussed -Nails sharply debrided 10 without complication/bleeding. -At this time she is attended multiple oral medications for neuropathy as well as topical without any significant relief in symptoms. We'll refer to neurology. -Paperwork again completed today for precertification diabetic shoes. -Discussed daily foot inspection. If there are any changes, to call the office immediately.  -Follow-up in 3 months or sooner if any problems are to arise. In the meantime, encouraged to call the office with any questions, concerns, changes symptoms.  Celesta Gentile, DPM

## 2016-04-16 ENCOUNTER — Other Ambulatory Visit (INDEPENDENT_AMBULATORY_CARE_PROVIDER_SITE_OTHER): Payer: Commercial Managed Care - HMO

## 2016-04-16 ENCOUNTER — Telehealth: Payer: Self-pay | Admitting: Internal Medicine

## 2016-04-16 ENCOUNTER — Ambulatory Visit
Admission: RE | Admit: 2016-04-16 | Discharge: 2016-04-16 | Disposition: A | Discharge status: Home / Self Care | Payer: Commercial Managed Care - HMO | Source: Ambulatory Visit | Attending: Internal Medicine | Admitting: Internal Medicine

## 2016-04-16 ENCOUNTER — Encounter: Payer: Self-pay | Admitting: Emergency Medicine

## 2016-04-16 ENCOUNTER — Other Ambulatory Visit: Payer: Self-pay | Admitting: Internal Medicine

## 2016-04-16 ENCOUNTER — Ambulatory Visit (INDEPENDENT_AMBULATORY_CARE_PROVIDER_SITE_OTHER): Payer: Commercial Managed Care - HMO | Admitting: Internal Medicine

## 2016-04-16 ENCOUNTER — Encounter: Payer: Self-pay | Admitting: Internal Medicine

## 2016-04-16 VITALS — BP 134/88 | HR 52 | Temp 97.6°F | Resp 16 | Wt 312.0 lb

## 2016-04-16 DIAGNOSIS — M810 Age-related osteoporosis without current pathological fracture: Secondary | ICD-10-CM

## 2016-04-16 DIAGNOSIS — E119 Type 2 diabetes mellitus without complications: Secondary | ICD-10-CM

## 2016-04-16 DIAGNOSIS — Z1159 Encounter for screening for other viral diseases: Secondary | ICD-10-CM | POA: Diagnosis not present

## 2016-04-16 DIAGNOSIS — G6289 Other specified polyneuropathies: Secondary | ICD-10-CM

## 2016-04-16 DIAGNOSIS — Z114 Encounter for screening for human immunodeficiency virus [HIV]: Secondary | ICD-10-CM

## 2016-04-16 DIAGNOSIS — G4700 Insomnia, unspecified: Secondary | ICD-10-CM

## 2016-04-16 DIAGNOSIS — Z23 Encounter for immunization: Secondary | ICD-10-CM

## 2016-04-16 DIAGNOSIS — M5136 Other intervertebral disc degeneration, lumbar region: Secondary | ICD-10-CM

## 2016-04-16 DIAGNOSIS — M1712 Unilateral primary osteoarthritis, left knee: Secondary | ICD-10-CM

## 2016-04-16 DIAGNOSIS — G8929 Other chronic pain: Secondary | ICD-10-CM

## 2016-04-16 DIAGNOSIS — E559 Vitamin D deficiency, unspecified: Secondary | ICD-10-CM

## 2016-04-16 DIAGNOSIS — I10 Essential (primary) hypertension: Secondary | ICD-10-CM

## 2016-04-16 LAB — COMPREHENSIVE METABOLIC PANEL
ALT: 21 U/L (ref 0–35)
AST: 26 U/L (ref 0–37)
Albumin: 4.1 g/dL (ref 3.5–5.2)
Alkaline Phosphatase: 109 U/L (ref 39–117)
BUN: 12 mg/dL (ref 6–23)
CO2: 33 mEq/L — ABNORMAL HIGH (ref 19–32)
Calcium: 9.6 mg/dL (ref 8.4–10.5)
Chloride: 99 mEq/L (ref 96–112)
Creatinine, Ser: 0.81 mg/dL (ref 0.40–1.20)
GFR: 94.75 mL/min (ref >60.00)
Glucose, Bld: 168 mg/dL — ABNORMAL HIGH (ref 70–99)
Potassium: 4 mEq/L (ref 3.5–5.1)
Sodium: 141 mEq/L (ref 135–145)
Total Bilirubin: 0.3 mg/dL (ref 0.2–1.2)
Total Protein: 7.7 g/dL (ref 6.0–8.3)

## 2016-04-16 LAB — VITAMIN D 25 HYDROXY (VIT D DEFICIENCY, FRACTURES): VITD: 14.55 ng/mL — ABNORMAL LOW (ref 30.00–100.00)

## 2016-04-16 LAB — HIV ANTIBODY (ROUTINE TESTING W REFLEX): HIV 1&2 Ab, 4th Generation: NONREACTIVE

## 2016-04-16 LAB — VITAMIN B12: Vitamin B-12: 400 pg/mL (ref 211–911)

## 2016-04-16 LAB — HEMOGLOBIN A1C: Hgb A1c MFr Bld: 8.9 % — ABNORMAL HIGH (ref 4.6–6.5)

## 2016-04-16 MED ORDER — VITAMIN D (ERGOCALCIFEROL) 1.25 MG (50000 UNIT) PO CAPS: 50000.0000 [IU] | ORAL_CAPSULE | ORAL | 0 refills | Status: AC

## 2016-04-16 MED ORDER — OXYCODONE-ACETAMINOPHEN 10-325 MG PO TABS: 1.0000 | ORAL_TABLET | Freq: Three times a day (TID) | ORAL | 0 refills | Status: DC | PRN

## 2016-04-16 MED ORDER — FLUCONAZOLE 150 MG PO TABS: 150.0000 mg | ORAL_TABLET | Freq: Once | ORAL | 0 refills | Status: AC

## 2016-04-16 MED FILL — OXYCODONE-APAP 10-325: 10-325 | 30 days supply | Qty: 90 | Fill #0

## 2016-04-16 NOTE — Addendum Note (Signed)
Addended by: Terence Lux B on: 04/16/2016 11:55 AM   Modules accepted: Orders

## 2016-04-16 NOTE — Assessment & Plan Note (Signed)
Check B12 level Continue gabapentin

## 2016-04-16 NOTE — Telephone Encounter (Signed)
Please change order to [facility] or the Breast center.

## 2016-04-16 NOTE — Patient Instructions (Addendum)
[facility] [address] [place], [address] [place]  [place], [place]  [address] [address], [place] [id number] [phone]   Test(s) ordered today. Your results will be released to [place] (or called to you) after review, usually within 72hours after test completion. If any changes need to be made, you will be notified at that same time.  All other Health Maintenance issues reviewed.   All recommended immunizations and age-appropriate screenings are up-to-date or discussed.  Pneumovax immunization administered today.   Medications reviewed and updated.  No changes recommended at this time.  Your prescription(s) have been submitted to your pharmacy. Please take as directed and contact our office if you believe you are having problem(s) with the medication(s).   Please followup in 3 months

## 2016-04-16 NOTE — Telephone Encounter (Signed)
ordered

## 2016-04-16 NOTE — Assessment & Plan Note (Signed)
Medications not working as well, but on many meds - no changes at this time

## 2016-04-16 NOTE — Progress Notes (Signed)
Subjective:    Patient ID: Tammy Strickland, female    DOB: Jun 05, 1960, 56 y.o.   MRN: [id number]  HPI The patient is here for follow up.  Chronic back and b/l knee pain:  She is seeing orthopedics.  At her last visit we discussed pain management and she wanted a referral for a different pain management, which I ordered.  I agreed to temporarily prescribe her pain medications.  I did discuss with her, as documented, that she can not ask for an early refill, share her medication, drink alcohol while taking the medication and that only I can fill pain medication for her while I am prescribing the medication.  Her last refill from me was 03/11/16 for 30 days.  She did see Dr Nelva Bush and had injections in her back.  She got a pain medication prescription for Hydrocodone on 03/25/16 for 5 days.  The knee orthopedic will not operate until she loses 70 lbs.  She plans on going to flexergenics to help with her knee pain.  Trigger fingers:  Right middle finger and left thumb get stuck intermittently.    She has an appointment with neurology on the 19th.    She is having vaginal itching, but denies vaginal discharge.  She thinks she has a yeast infection. She tries to keep the area clean.  She uses powder daily.   Insomnia:  She is taking trazodone and elavil nightly.    Diabetes: She is taking her medication daily as prescribed. She is compliant with a diabetic diet. She is not exercising regularly. She monitors her sugars and they have been running 86, 119, highest 201 after she ate something she should not. She checks her feet daily and denies foot lesions -  She sees a podiatrist. She is up-to-date with an ophthalmology examination.     Medications and allergies reviewed with patient and updated if appropriate.  Patient Active Problem List   Diagnosis Date Noted  . Encounter for chronic pain management 03/12/2016  . Hyperlipidemia 01/15/2016  . Gait disorder 12/04/2015  . Diabetes ([place])  11/26/2015  . Chronic diastolic congestive heart failure ([place]) 11/06/2015  . TIA (transient ischemic attack)   . 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 Chest pain 10/31/2015  . Insomnia 10/10/2015  . Depression 10/10/2015  . Benign essential HTN 08/15/2015  . Fibromyalgia 08/15/2015  . Peripheral neuropathy ([place]) 08/15/2015  . CAD (coronary artery disease) 08/15/2015  . COPD (chronic obstructive pulmonary disease) ([place]) 08/15/2015  . Osteoporosis 08/15/2015  . 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 DDD (degenerative disc disease), lumbar 08/15/2015  . Occupational exposure to industrial toxins 08/15/2015  . GERD (gastroesophageal reflux disease) 08/15/2015    Current Outpatient Prescriptions on File Prior to Visit  Medication Sig Dispense Refill  . amitriptyline (ELAVIL) 25 MG tablet Take 2 tablets (50 mg total) by mouth at bedtime. 60 tablet 5  . aspirin EC 81 MG tablet Take 1 tablet (81 mg total) by mouth daily.    Marland Kitchen atorvastatin (LIPITOR) 20 MG tablet Take 1 tablet (20 mg total) by mouth daily. 90 tablet 2  . Blood Glucose Monitoring Suppl (TRUE METRIX AIR GLUCOSE METER) DEVI 1 Device by Does not apply route daily. Use as directed twice daily 1 Device 0  . cloNIDine (CATAPRES) 0.3 MG tablet Take 1 tablet (0.3 mg total) by mouth daily. Reported on 08/15/2015 90 tablet 1  . fluticasone (FLONASE) 50 MCG/ACT nasal spray Place 1 spray into both nostrils daily as needed for  allergies. 16 g 8  . furosemide (LASIX) 80 MG tablet Take 1 tablet (80 mg total) by mouth daily. 90 tablet 1  . gabapentin (NEURONTIN) 800 MG tablet Take 1.5 tablets (1,200 mg total) by mouth 3 (three) times daily. 135 tablet 1  . glucose blood (TRUE METRIX BLOOD GLUCOSE TEST) test strip Use as instructed twice per day 200 each 12  . Ipratropium-Albuterol (COMBIVENT) 20-100 MCG/ACT AERS respimat Inhale 1 puff into the lungs every 6 (six) hours. 3 Inhaler 1  . Lancets MISC Use as directed twice per day 200 each 11  . losartan (COZAAR)  50 MG tablet Take 1 tablet (50 mg total) by mouth 2 (two) times daily. 180 tablet 2  . metFORMIN (GLUCOPHAGE-XR) 500 MG 24 hr tablet Take 1 tablet (500 mg total) by mouth 2 (two) times daily with a meal. 180 tablet 1  . metoprolol (TOPROL-XL) 200 MG 24 hr tablet TAKE 1 TABLET (200 MG TOTAL) BY MOUTH DAILY. 90 tablet 2  . naproxen (NAPROSYN) 375 MG tablet Take 1 tablet (375 mg total) by mouth 2 (two) times daily. 20 tablet 0  . nitroGLYCERIN (NITROSTAT) 0.4 MG SL tablet Place 0.4 mg under the tongue every 5 (five) minutes as needed for chest pain.    Marland Kitchen omeprazole (PRILOSEC) 20 MG capsule Take 1 capsule (20 mg total) by mouth daily. 90 capsule 3  . oxyCODONE-acetaminophen (PERCOCET) 10-325 MG tablet Take 1 tablet by mouth every 8 (eight) hours as needed for pain. 90 tablet 0  . potassium chloride SA (K-DUR,KLOR-CON) 20 MEQ tablet TAKE 1 TABLET (20 MEQ TOTAL) BY MOUTH DAILY. 90 tablet 1  . tiZANidine (ZANAFLEX) 4 MG tablet Take 2.5 tablets (10 mg total) by mouth 3 (three) times daily. 270 tablet 1  . traZODone (DESYREL) 50 MG tablet Take 1 tablet (50 mg total) by mouth at bedtime. 30 tablet 5   No current facility-administered medications on file prior to visit.     Past Medical History:  Diagnosis Date  . Anxiety   . Arthritis   . CHF (congestive heart failure) ([place])   . COPD (chronic obstructive pulmonary disease) ([place])   . Coronary artery disease   . Depression   . Diabetes mellitus without complication ([place])   . GERD (gastroesophageal reflux disease)   . Hypertension   . Lupus   . Neuromuscular disorder ([place])   . Osteoporosis   . Peripheral neuropathy ([place])   . Stroke [facility])     Past Surgical History:  Procedure Laterality Date  . ABDOMINAL HYSTERECTOMY    . APPENDECTOMY    . CHOLECYSTECTOMY    . IR GENERIC HISTORICAL  11/07/2015   IR ANGIO INTRA EXTRACRAN SEL COM CAROTID INNOMINATE BILAT MOD SED 11/07/2015 Luanne Bras, MD [facility]  . IR GENERIC HISTORICAL  11/07/2015     IR ANGIO VERTEBRAL SEL SUBCLAVIAN INNOMINATE UNI R MOD SED 11/07/2015 Luanne Bras, MD [facility]  . IR GENERIC HISTORICAL  11/07/2015   IR ANGIO VERTEBRAL SEL VERTEBRAL UNI L MOD SED 11/07/2015 Luanne Bras, MD [facility]  . IR GENERIC HISTORICAL  11/07/2015   IR ANGIOGRAM EXTREMITY LEFT 11/07/2015 Luanne Bras, MD [facility]  . TONSILLECTOMY      Social History   Social History  . Marital status: Single    Spouse name: n/a  . Number of children: 3  . Years of education: 12+   Occupational History  . 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 Smoking status: Current Every Day Smoker    Packs/day: 1.00    Years: 35.00    Types: Cigarettes  . Smokeless tobacco: Never Used     Comment: referred  to smoking  cessation  classes. at  [facility]   . Alcohol use No  . 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   Marland Kitchen Sexual activity: Yes    Partners: Female   Other Topics Concern  . Not on file   Social History Narrative   Moved to [place] from [place], [place] February 2017, to help her daughter.   Lives with her daughter.   Sons live in [place] and [place].   She reports that there were originally 17 children in her family (she is the youngest), the oldest are deceased, some prior to her birth, and she isn't sure which were female/female or how they died.    Family History  Problem Relation Age of Onset  . Hyperlipidemia Mother   . Hypertension Mother   . Hyperlipidemia Father   . Hypertension Father   . Stroke Father   . Hypertension Sister   . Cancer Sister     breast cancer  . Crohn's disease Sister     Review of Systems  Constitutional: Negative for fever.  HENT: Positive for postnasal drip and sneezing.   Respiratory: Positive for wheezing (a little). Negative for cough and shortness of breath.   Cardiovascular: Positive for chest pain (rare chest pain),  palpitations (panic attack the other day) and leg swelling (occ).  Musculoskeletal: Positive for arthralgias and back pain.  Neurological: Positive for headaches. Negative for light-headedness.       Objective:   Vitals:   04/16/16 0810  BP: 134/88  Pulse: (!) 52  Resp: 16  Temp: 97.6 F (36.4 C)   Wt Readings from Last 3 Encounters:  04/16/16 (!) 312 lb (141.5 kg)  03/11/16 (!) 310 lb (140.6 kg)  02/20/16 (!) 306 lb 8 oz (139 kg)   Body mass index is 50.36 kg/m.   Physical Exam    Constitutional: Appears well-developed and well-nourished. No distress.  HENT:  Head: Normocephalic and atraumatic.  Neck: Neck supple. No tracheal deviation present. No thyromegaly present.  No cervical lymphadenopathy Cardiovascular: Normal rate, regular rhythm and normal heart sounds.   No murmur heard. No carotid bruit .  No edema Pulmonary/Chest: Effort normal and breath sounds normal. No respiratory distress. No has no wheezes. No rales.  Skin: Skin is warm and dry. Not diaphoretic.  Psychiatric: Normal mood and affect. Behavior is normal.    03/25/2016 HYDROCODONE- ACETAMIN 10- 325 MG [id number] 10  5 0 0 [id number] RAMOS Tonette Lederer MD [place], [place] [id number] Delene Loll, [place], [place] [birth year] [address] [place], [place] [address] 03 20 03/11/2016  03/11/2016 OXYCODONE- ACETAMINOPHEN 10- 325 [id number] 0 0 [id number] Denmark MD [place], [place] [id number] Delene Loll, [place], [place] 1960/08/29 [address] [place], [place] [address] 03 45 02/20/2016  02/20/2016 OXYCODONE- ACETAMINOPHEN 10- 325 [id number] 0 0 [id number] [place], [place] [id number] [facility] [place], [place], [place] [birth year] [address] [place], [place] [address] 04 45 01/15/2016  01/15/2016 OXYCODONE- ACETAMINOPHEN 10- 325 [id number] 0 0 [id number] [place], [place] [id number]   Delene Loll, [place], [place] 1961/03/26 [address] [place],  [place] [address] 03 45   Assessment & Plan:   Pneumonvax today Will schedule dexa Blood work today  See Problem List for Assessment and Plan of chronic medical problems.    FU in 3 months

## 2016-04-16 NOTE — Telephone Encounter (Signed)
Tammy Strickland was told she's overweight for the DEXA table and needs to be sent elsewhere. Please give her a phone call regarding this if needed.  Pt's ph# [phone] Thank you.

## 2016-04-16 NOTE — Assessment & Plan Note (Addendum)
BP well controlled Current regimen effective and well tolerated Continue current medications at current doses cmp  

## 2016-04-16 NOTE — Assessment & Plan Note (Addendum)
dexa ordered Check vitamin D level Drinks lots of milk Unable to exercise due to chronic pain

## 2016-04-16 NOTE — Assessment & Plan Note (Addendum)
Check a1c Stressed weight loss -  Decrease intake Work on weight loss

## 2016-04-16 NOTE — Progress Notes (Signed)
Pre visit review using our clinic review tool, if applicable. No additional management support is needed unless otherwise documented below in the visit note. 

## 2016-04-16 NOTE — Assessment & Plan Note (Signed)
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

## 2016-04-16 NOTE — Assessment & Plan Note (Signed)
Following with Dr Nelva Bush - having injections May need surgery at some point, but needs to have injections first Will continue current pain medication

## 2016-04-17 LAB — HEPATITIS C ANTIBODY: HCV Ab: NEGATIVE

## 2016-04-17 NOTE — Telephone Encounter (Signed)
LVM informing pt

## 2016-05-01 ENCOUNTER — Ambulatory Visit: Payer: Commercial Managed Care - HMO | Admitting: Neurology

## 2016-05-01 NOTE — Progress Notes (Deleted)
[facility] Clinic Note - Initial Visit   Date: 05/01/16  Tammy Strickland MRN: [id number] DOB: [birth year]   Dear Dr Tammy KitchenBinnie Rail, MD:  Thank you for your kind referral of Tammy Strickland for consultation of ***. Although *** history is well known to you, please allow [place] to reiterate it for the purpose of our medical record. The patient was accompanied to the clinic by *** who also provides collateral information.     History of Present Illness: Tammy Strickland is a 56 y.o. ***-handed Caucasian/*** ***female with *** presenting for evaluation of ***.    Out-side paper records, electronic medical record, and images have been reviewed where available and summarized as: ***  Past Medical History:  Diagnosis Date  . Anxiety   . Arthritis   . CHF (congestive heart failure) ([place])   . COPD (chronic obstructive pulmonary disease) ([place])   . Coronary artery disease   . Depression   . Diabetes mellitus without complication ([place])   . GERD (gastroesophageal reflux disease)   . Hypertension   . Lupus   . Neuromuscular disorder ([place])   . Osteoporosis   . Peripheral neuropathy ([place])   . Stroke [place])     Past Surgical History:  Procedure Laterality Date  . ABDOMINAL HYSTERECTOMY    . APPENDECTOMY    . CHOLECYSTECTOMY    . IR GENERIC HISTORICAL  11/07/2015   IR ANGIO INTRA EXTRACRAN SEL COM CAROTID INNOMINATE BILAT MOD SED 11/07/2015 Tammy Bras, MD [facility]  . IR GENERIC HISTORICAL  11/07/2015   IR ANGIO VERTEBRAL SEL SUBCLAVIAN INNOMINATE UNI R MOD SED 11/07/2015 Tammy Bras, MD [facility]  . IR GENERIC HISTORICAL  11/07/2015   IR ANGIO VERTEBRAL SEL VERTEBRAL UNI L MOD SED 11/07/2015 Tammy Bras, MD [facility]  . IR GENERIC HISTORICAL  11/07/2015   IR ANGIOGRAM EXTREMITY LEFT 11/07/2015 Tammy Bras, MD [facility]  . TONSILLECTOMY       Medications:  Outpatient Encounter Prescriptions as of 05/01/2016  Medication Sig  Note  . amitriptyline (ELAVIL) 25 MG tablet Take 2 tablets (50 mg total) by mouth at bedtime.   [place] aspirin EC 81 MG tablet Take 1 tablet (81 mg total) by mouth daily.   [place] atorvastatin (LIPITOR) 20 MG tablet Take 1 tablet (20 mg total) by mouth daily.   . Blood Glucose Monitoring Suppl (TRUE METRIX AIR GLUCOSE METER) DEVI 1 Device by Does not apply route daily. Use as directed twice daily   . cloNIDine (CATAPRES) 0.3 MG tablet Take 1 tablet (0.3 mg total) by mouth daily. Reported on 08/15/2015   . fluticasone (FLONASE) 50 MCG/ACT nasal spray Place 1 spray into both nostrils daily as needed for allergies.   . furosemide (LASIX) 80 MG tablet Take 1 tablet (80 mg total) by mouth daily.   [place] gabapentin (NEURONTIN) 800 MG tablet Take 1.5 tablets (1,200 mg total) by mouth 3 (three) times daily.   [place] glucose blood (TRUE METRIX BLOOD GLUCOSE TEST) test strip Use as instructed twice per day   . Ipratropium-Albuterol (COMBIVENT) 20-100 MCG/ACT AERS respimat Inhale 1 puff into the lungs every 6 (six) hours.   . Lancets MISC Use as directed twice per day   . losartan (COZAAR) 50 MG tablet Take 1 tablet (50 mg total) by mouth 2 (two) times daily.   . metFORMIN (GLUCOPHAGE-XR) 500 MG 24 hr tablet Take 1 tablet (500 mg total) by mouth 2 (two) times daily with a meal.   . metoprolol (  TOPROL-XL) 200 MG 24 hr tablet TAKE 1 TABLET (200 MG TOTAL) BY MOUTH DAILY.   . naproxen (NAPROSYN) 375 MG tablet Take 1 tablet (375 mg total) by mouth 2 (two) times daily.   . nitroGLYCERIN (NITROSTAT) 0.4 MG SL tablet Place 0.4 mg under the tongue every 5 (five) minutes as needed for chest pain. 12/02/2015: Kept on hand, but only used when needed (APPROX 6 MONTHS AGO)  . omeprazole (PRILOSEC) 20 MG capsule Take 1 capsule (20 mg total) by mouth daily.   [place] oxyCODONE-acetaminophen (PERCOCET) 10-325 MG tablet Take 1 tablet by mouth every 8 (eight) hours as needed for pain.   . potassium chloride SA (K-DUR,KLOR-CON) 20 MEQ tablet TAKE 1 TABLET  (20 MEQ TOTAL) BY MOUTH DAILY.   [place] tiZANidine (ZANAFLEX) 4 MG tablet Take 2.5 tablets (10 mg total) by mouth 3 (three) times daily.   . traZODone (DESYREL) 50 MG tablet Take 1 tablet (50 mg total) by mouth at bedtime.   . Vitamin D, Ergocalciferol, (DRISDOL) 50000 units CAPS capsule Take 1 capsule (50,000 Units total) by mouth every 7 (seven) days.    No facility-administered encounter medications on file as of 05/01/2016.      Allergies: No Known Allergies  Family History: Family History  Problem Relation Age of Onset  . Hyperlipidemia Mother   . Hypertension Mother   . Hyperlipidemia Father   . Hypertension Father   . Stroke Father   . Hypertension Sister   . Cancer Sister     breast cancer  . Crohn's disease Sister     Social History: Social History  Substance Use Topics  . Smoking status: Current Every Day Smoker    Packs/day: 1.00    Years: 35.00    Types: Cigarettes  . Smokeless tobacco: Never Used     Comment: referred  to smoking  cessation  classes. at  [facility]   . Alcohol use No   Social History   Social History Narrative   Moved to [place] from [place], [place] February 2017, to help her daughter.   Lives with her daughter.   Sons live in [place].   She reports that there were originally 17 children in her family (she is the youngest), the oldest are deceased, some prior to her birth, and she isn't sure which were female/female or how they died.    Review of Systems:  CONSTITUTIONAL: No fevers, chills, night sweats, or weight loss.  *** EYES: No visual changes or eye pain ENT: No hearing changes.  No history of nose bleeds.   RESPIRATORY: No cough, wheezing and shortness of breath.   CARDIOVASCULAR: Negative for chest pain, and palpitations.   GI: Negative for abdominal discomfort, blood in stools or black stools.  No recent change in bowel habits.   GU:  No history of incontinence.   MUSCLOSKELETAL: No history of joint pain or swelling.  No  myalgias.   SKIN: Negative for lesions, rash, and itching.   HEMATOLOGY/ONCOLOGY: Negative for prolonged bleeding, bruising easily, and swollen nodes.  No history of cancer.   ENDOCRINE: Negative for cold or heat intolerance, polydipsia or goiter.   PSYCH:  ***depression or anxiety symptoms.   NEURO: As Above.   Vital Signs:  There were no vitals taken for this visit. Pain Scale: *** on a scale of 0-10   General Medical Exam:  *** General:  Well appearing, comfortable.   Eyes/ENT: see cranial nerve examination.   Neck: No masses appreciated.  Full range  of motion without tenderness.  No carotid bruits. Respiratory:  Clear to auscultation, good air entry bilaterally.   Cardiac:  Regular rate and rhythm, no murmur.   Extremities:  No deformities, edema, or skin discoloration.  Skin:  No rashes or lesions.  Neurological Exam: MENTAL STATUS including orientation to time, place, person, recent and remote memory, attention span and concentration, language, and fund of knowledge is ***normal.  Speech is not dysarthric.  CRANIAL NERVES: II:  No visual field defects.  Unremarkable fundi.   III-IV-VI: Pupils equal round and reactive to light.  Normal conjugate, extra-ocular eye movements in all directions of gaze.  No nystagmus.  No ptosis***.   V:  Normal facial sensation.  Jaw jerk is ***.   VII:  Normal facial symmetry and movements.  No pathologic facial reflexes.  VIII:  Normal hearing and vestibular function.   IX-X:  Normal palatal movement.   XI:  Normal shoulder shrug and head rotation.   XII:  Normal tongue strength and range of motion, no deviation or fasciculation.  MOTOR:  No atrophy, fasciculations or abnormal movements.  No pronator drift.  Tone is normal.    Right Upper Extremity:    Left Upper Extremity:    Deltoid  5/5   Deltoid  5/5   Biceps  5/5   Biceps  5/5   Triceps  5/5   Triceps  5/5   Wrist extensors  5/5   Wrist extensors  5/5   Wrist flexors  5/5   Wrist  flexors  5/5   Finger extensors  5/5   Finger extensors  5/5   Finger flexors  5/5   Finger flexors  5/5   Dorsal interossei  5/5   Dorsal interossei  5/5   Abductor pollicis  5/5   Abductor pollicis  5/5   Tone (Ashworth scale)  0  Tone (Ashworth scale)  0   Right Lower Extremity:    Left Lower Extremity:    Hip flexors  5/5   Hip flexors  5/5   Hip extensors  5/5   Hip extensors  5/5   Knee flexors  5/5   Knee flexors  5/5   Knee extensors  5/5   Knee extensors  5/5   Dorsiflexors  5/5   Dorsiflexors  5/5   Plantarflexors  5/5   Plantarflexors  5/5   Toe extensors  5/5   Toe extensors  5/5   Toe flexors  5/5   Toe flexors  5/5   Tone (Ashworth scale)  0  Tone (Ashworth scale)  0   MSRs:  Right                                                                 Left brachioradialis 2+  brachioradialis 2+  biceps 2+  biceps 2+  triceps 2+  triceps 2+  patellar 2+  patellar 2+  ankle jerk 2+  ankle jerk 2+  Hoffman no  Hoffman no  plantar response down  plantar response down   SENSORY:  Normal and symmetric perception of light touch, pinprick, vibration, and proprioception.  Romberg's sign absent.   COORDINATION/GAIT: Normal finger-to- nose-finger and heel-to-shin.  Intact rapid alternating movements bilaterally.  Able to rise from a chair without using  arms.  Gait narrow based and stable. Tandem and stressed gait intact.    IMPRESSION: ***  PLAN/RECOMMENDATIONS:  *** Return to clinic in *** months.   The duration of this appointment visit was *** minutes of face-to-face time with the patient.  Greater than 50% of this time was spent in counseling, explanation of diagnosis, planning of further management, and coordination of care.   Thank you for allowing me to participate in patient's care.  If I can answer any additional questions, I would be pleased to do so.    Sincerely,    Aalliyah Kilker K. Posey Pronto, DO

## 2016-05-07 ENCOUNTER — Other Ambulatory Visit: Payer: Self-pay | Admitting: Emergency Medicine

## 2016-05-07 DIAGNOSIS — M5136 Other intervertebral disc degeneration, lumbar region: Secondary | ICD-10-CM

## 2016-05-07 DIAGNOSIS — I10 Essential (primary) hypertension: Secondary | ICD-10-CM

## 2016-05-07 MED ORDER — METOPROLOL SUCCINATE ER 200 MG PO TB24: 200.0000 mg | ORAL_TABLET | Freq: Every day | ORAL | 1 refills | Status: DC

## 2016-05-07 MED ORDER — POTASSIUM CHLORIDE CRYS ER 20 MEQ PO TBCR: EXTENDED_RELEASE_TABLET | ORAL | 1 refills | Status: DC

## 2016-05-07 MED ORDER — OMEPRAZOLE 20 MG PO CPDR: 20.0000 mg | DELAYED_RELEASE_CAPSULE | Freq: Every day | ORAL | 1 refills | Status: DC

## 2016-05-07 MED ORDER — FLUTICASONE PROPIONATE 50 MCG/ACT NA SUSP: 1.0000 | Freq: Every day | NASAL | 1 refills | Status: DC | PRN

## 2016-05-07 MED ORDER — TRAZODONE HCL 50 MG PO TABS: 50.0000 mg | ORAL_TABLET | Freq: Every day | ORAL | 0 refills | Status: DC

## 2016-05-07 MED ORDER — CLONIDINE HCL 0.3 MG PO TABS: 0.3000 mg | ORAL_TABLET | Freq: Every day | ORAL | 1 refills | Status: DC

## 2016-05-07 MED ORDER — TIZANIDINE HCL 4 MG PO TABS: 10.0000 mg | ORAL_TABLET | Freq: Three times a day (TID) | ORAL | 0 refills | Status: DC

## 2016-05-07 NOTE — Telephone Encounter (Signed)
RXs have been sent to pts new mail order pharm.

## 2016-05-08 DIAGNOSIS — M47816 Spondylosis without myelopathy or radiculopathy, lumbar region: Secondary | ICD-10-CM | POA: Diagnosis not present

## 2016-05-08 DIAGNOSIS — M5136 Other intervertebral disc degeneration, lumbar region: Secondary | ICD-10-CM | POA: Diagnosis not present

## 2016-05-11 ENCOUNTER — Other Ambulatory Visit: Payer: Self-pay

## 2016-05-11 DIAGNOSIS — M47816 Spondylosis without myelopathy or radiculopathy, lumbar region: Secondary | ICD-10-CM | POA: Diagnosis not present

## 2016-05-11 DIAGNOSIS — M62830 Muscle spasm of back: Secondary | ICD-10-CM | POA: Diagnosis not present

## 2016-05-12 NOTE — Telephone Encounter (Signed)
Patient requesting refill of gabapentin.

## 2016-05-13 DIAGNOSIS — E119 Type 2 diabetes mellitus without complications: Secondary | ICD-10-CM | POA: Diagnosis not present

## 2016-05-13 MED ORDER — GABAPENTIN 800 MG PO TABS
1200.0000 mg | ORAL_TABLET | Freq: Three times a day (TID) | ORAL | 1 refills | Status: DC
Start: 2016-05-13 — End: 2016-07-22

## 2016-05-13 NOTE — Addendum Note (Signed)
Addended by: Binnie Rail on: 05/13/2016 07:34 AM   Modules accepted: Orders

## 2016-05-13 NOTE — Telephone Encounter (Signed)
Ok to fill  rx sent to [facility] 

## 2016-05-14 ENCOUNTER — Other Ambulatory Visit: Payer: Self-pay | Admitting: *Deleted

## 2016-05-14 MED ORDER — AMITRIPTYLINE HCL 25 MG PO TABS: 50.0000 mg | ORAL_TABLET | Freq: Every day | ORAL | 2 refills | Status: DC

## 2016-05-14 MED ORDER — LOSARTAN POTASSIUM 50 MG PO TABS: 50.0000 mg | ORAL_TABLET | Freq: Two times a day (BID) | ORAL | 2 refills | Status: DC

## 2016-05-18 ENCOUNTER — Other Ambulatory Visit: Payer: Self-pay | Admitting: Emergency Medicine

## 2016-05-18 DIAGNOSIS — M51369 Other intervertebral disc degeneration, lumbar region without mention of lumbar back pain or lower extremity pain: Secondary | ICD-10-CM

## 2016-05-18 DIAGNOSIS — M1712 Unilateral primary osteoarthritis, left knee: Secondary | ICD-10-CM

## 2016-05-18 DIAGNOSIS — M5136 Other intervertebral disc degeneration, lumbar region: Secondary | ICD-10-CM

## 2016-05-18 MED ORDER — OXYCODONE-ACETAMINOPHEN 10-325 MG PO TABS: 1.0000 | ORAL_TABLET | Freq: Three times a day (TID) | ORAL | 0 refills | Status: DC | PRN

## 2016-05-18 NOTE — Telephone Encounter (Signed)
Pt requested refill of oxycodone. Refill approved by Dr Quay Burow.

## 2016-05-25 ENCOUNTER — Emergency Department (HOSPITAL_COMMUNITY): Payer: Medicare Other

## 2016-05-25 ENCOUNTER — Encounter (HOSPITAL_COMMUNITY): Payer: Self-pay | Admitting: Family Medicine

## 2016-05-25 ENCOUNTER — Emergency Department (HOSPITAL_COMMUNITY)
Admission: EM | Admit: 2016-05-25 | Discharge: 2016-05-26 | Disposition: A | Discharge status: Home / Self Care | Payer: Medicare Other | Attending: Emergency Medicine | Admitting: Emergency Medicine

## 2016-05-25 DIAGNOSIS — M545 Low back pain: Secondary | ICD-10-CM | POA: Diagnosis not present

## 2016-05-25 DIAGNOSIS — J069 Acute upper respiratory infection, unspecified: Secondary | ICD-10-CM | POA: Insufficient documentation

## 2016-05-25 DIAGNOSIS — Z79899 Other long term (current) drug therapy: Secondary | ICD-10-CM | POA: Diagnosis not present

## 2016-05-25 DIAGNOSIS — I2510 Atherosclerotic heart disease of native coronary artery without angina pectoris: Secondary | ICD-10-CM | POA: Insufficient documentation

## 2016-05-25 DIAGNOSIS — M546 Pain in thoracic spine: Secondary | ICD-10-CM | POA: Diagnosis not present

## 2016-05-25 DIAGNOSIS — E1142 Type 2 diabetes mellitus with diabetic polyneuropathy: Secondary | ICD-10-CM | POA: Insufficient documentation

## 2016-05-25 DIAGNOSIS — Z7984 Long term (current) use of oral hypoglycemic drugs: Secondary | ICD-10-CM | POA: Diagnosis not present

## 2016-05-25 DIAGNOSIS — Z7982 Long term (current) use of aspirin: Secondary | ICD-10-CM | POA: Diagnosis not present

## 2016-05-25 DIAGNOSIS — R05 Cough: Secondary | ICD-10-CM

## 2016-05-25 DIAGNOSIS — I5032 Chronic diastolic (congestive) heart failure: Secondary | ICD-10-CM | POA: Diagnosis not present

## 2016-05-25 DIAGNOSIS — R059 Cough, unspecified: Secondary | ICD-10-CM

## 2016-05-25 DIAGNOSIS — Z8673 Personal history of transient ischemic attack (TIA), and cerebral infarction without residual deficits: Secondary | ICD-10-CM | POA: Insufficient documentation

## 2016-05-25 DIAGNOSIS — J449 Chronic obstructive pulmonary disease, unspecified: Secondary | ICD-10-CM | POA: Diagnosis not present

## 2016-05-25 DIAGNOSIS — R079 Chest pain, unspecified: Secondary | ICD-10-CM | POA: Diagnosis not present

## 2016-05-25 DIAGNOSIS — F17210 Nicotine dependence, cigarettes, uncomplicated: Secondary | ICD-10-CM | POA: Diagnosis not present

## 2016-05-25 DIAGNOSIS — I110 Hypertensive heart disease with heart failure: Secondary | ICD-10-CM | POA: Diagnosis not present

## 2016-05-25 NOTE — ED Triage Notes (Signed)
Pt presents from home via POV with c/o right-sided lower back pain and cough that began yesterday - reports back pain occurred initially while coughing and is now constant. Pt reports recent sick contact with granddaughter and endorses non-productive cough and sore throat. PT A&Ox4 in NAD. Lungs CTA with diminished lung sounds at the bases.

## 2016-05-25 NOTE — ED Notes (Signed)
Pt states grandchildren were diagnosed with both flu and strep throat. Pt believes that she may have gotten it from them.

## 2016-05-26 DIAGNOSIS — M545 Low back pain: Secondary | ICD-10-CM | POA: Diagnosis not present

## 2016-05-26 LAB — RAPID STREP SCREEN (MED CTR MEBANE ONLY): Streptococcus, Group A Screen (Direct): NEGATIVE

## 2016-05-26 MED ORDER — DIAZEPAM 5 MG PO TABS
5.0000 mg | ORAL_TABLET | Freq: Once | ORAL | Status: AC
  Administered 2016-05-26: 5 mg via ORAL
  Filled 2016-05-26: qty 1

## 2016-05-26 MED ORDER — ALBUTEROL SULFATE (2.5 MG/3ML) 0.083% IN NEBU
5.0000 mg | INHALATION_SOLUTION | Freq: Once | RESPIRATORY_TRACT | Status: AC
  Administered 2016-05-26: 5 mg via RESPIRATORY_TRACT
  Filled 2016-05-26: qty 6

## 2016-05-26 MED ORDER — IPRATROPIUM BROMIDE 0.02 % IN SOLN
0.5000 mg | Freq: Once | RESPIRATORY_TRACT | Status: AC
  Administered 2016-05-26: 0.5 mg via RESPIRATORY_TRACT
  Filled 2016-05-26: qty 2.5

## 2016-05-26 MED ORDER — HYDROCODONE-ACETAMINOPHEN 5-325 MG PO TABS: 1.0000 | ORAL_TABLET | ORAL | 0 refills | Status: DC | PRN

## 2016-05-26 MED ORDER — DIAZEPAM 5 MG PO TABS: 5.0000 mg | ORAL_TABLET | Freq: Two times a day (BID) | ORAL | 0 refills | Status: DC

## 2016-05-26 MED ORDER — BENZONATATE 100 MG PO CAPS: 100.0000 mg | ORAL_CAPSULE | Freq: Three times a day (TID) | ORAL | 0 refills | Status: DC

## 2016-05-26 NOTE — ED Provider Notes (Signed)
[facility] DEPT Provider Note   CSN: [id number] Arrival date & time: 05/25/16  1818     History   Chief Complaint Chief Complaint  Patient presents with  . Back Pain  . Cough    HPI Tam[place] Strickland is a 56 y.o. female.  Patient with sore throat and cough this am causing a "pop" in her back. Now having pain in left upper back. History of chronic bronchitis with cough, usually not productive. Today cough is productive and she has SOB. No fever. She reports family members at home diagnosed with both strep and the flu.    The history is provided by the patient. No language interpreter was used.    Past Medical History:  Diagnosis Date  . Anxiety   . Arthritis   . CHF (congestive heart failure) ([place])   . COPD (chronic obstructive pulmonary disease) ([place])   . Coronary artery disease   . Depression   . Diabetes mellitus without complication ([place])   . GERD (gastroesophageal reflux disease)   . Hypertension   . Lupus   . Neuromuscular disorder ([place])   . Osteoporosis   . Peripheral neuropathy ([place])   . Stroke [facility])     Patient Active Problem List   Diagnosis Date Noted  . Vitamin D deficiency 04/16/2016  . Encounter for chronic pain management 03/12/2016  . Hyperlipidemia 01/15/2016  . Gait disorder 12/04/2015  . Diabetes ([place]) 11/26/2015  . Chronic diastolic congestive heart failure ([place]) 11/06/2015  . TIA (transient ischemic attack)   . 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 Chest pain 10/31/2015  . Insomnia 10/10/2015  . Depression 10/10/2015  . Benign essential HTN 08/15/2015  . Fibromyalgia 08/15/2015  . Peripheral neuropathy ([place]) 08/15/2015  . CAD (coronary artery disease) 08/15/2015  . COPD (chronic obstructive pulmonary disease) ([place]) 08/15/2015  . Osteoporosis 08/15/2015  . 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 DDD (degenerative disc disease), lumbar 08/15/2015  . Occupational exposure to industrial toxins 08/15/2015  . GERD (gastroesophageal reflux disease)  08/15/2015    Past Surgical History:  Procedure Laterality Date  . ABDOMINAL HYSTERECTOMY    . APPENDECTOMY    . CHOLECYSTECTOMY    . IR GENERIC HISTORICAL  11/07/2015   IR ANGIO INTRA EXTRACRAN SEL COM CAROTID INNOMINATE BILAT MOD SED 11/07/2015 Luanne Bras, MD [facility]  . IR GENERIC HISTORICAL  11/07/2015   IR ANGIO VERTEBRAL SEL SUBCLAVIAN INNOMINATE UNI R MOD SED 11/07/2015 Luanne Bras, MD [facility]  . IR GENERIC HISTORICAL  11/07/2015   IR ANGIO VERTEBRAL SEL VERTEBRAL UNI L MOD SED 11/07/2015 Luanne Bras, MD [facility]  . IR GENERIC HISTORICAL  11/07/2015   IR ANGIOGRAM EXTREMITY LEFT 11/07/2015 Luanne Bras, MD [facility]  . TONSILLECTOMY      OB History    No data available       Home Medications    Prior to Admission medications   Medication Sig Start Date End Date Taking? Authorizing Provider  amitriptyline (ELAVIL) 25 MG tablet Take 2 tablets (50 mg total) by mouth at bedtime. 05/14/16   Binnie Rail, MD  aspirin EC 81 MG tablet Take 1 tablet (81 mg total) by mouth daily. 11/01/15   Silver Huguenin Elgergawy, MD  atorvastatin (LIPITOR) 20 MG tablet Take 1 tablet (20 mg total) by mouth daily. 02/20/16   Janith Lima, MD  Blood Glucose Monitoring Suppl (TRUE METRIX AIR GLUCOSE METER) DEVI 1 Device by Does not apply route daily. Use as directed twice daily 12/04/15  Biagio Borg, MD  cloNIDine (CATAPRES) 0.3 MG tablet Take 1 tablet (0.3 mg total) by mouth daily. Reported on 08/15/2015 05/07/16   Binnie Rail, MD  fluticasone (FLONASE) 50 MCG/ACT nasal spray Place 1 spray into both nostrils daily as needed for allergies. 05/07/16   Binnie Rail, MD  furosemide (LASIX) 80 MG tablet Take 1 tablet (80 mg total) by mouth daily. 11/08/15   Binnie Rail, MD  gabapentin (NEURONTIN) 800 MG tablet Take 1.5 tablets (1,200 mg total) by mouth 3 (three) times daily. 05/13/16   Binnie Rail, MD  glucose blood (TRUE METRIX BLOOD GLUCOSE TEST) test strip Use as  instructed twice per day 12/04/15   Biagio Borg, MD  Ipratropium-Albuterol (COMBIVENT) 20-100 MCG/ACT AERS respimat Inhale 1 puff into the lungs every 6 (six) hours. 02/20/16   Binnie Rail, MD  Lancets MISC Use as directed twice per day 12/04/15   Biagio Borg, MD  losartan (COZAAR) 50 MG tablet Take 1 tablet (50 mg total) by mouth 2 (two) times daily. 05/14/16   Binnie Rail, MD  metFORMIN (GLUCOPHAGE-XR) 500 MG 24 hr tablet Take 1 tablet (500 mg total) by mouth 2 (two) times daily with a meal. 01/20/16   Binnie Rail, MD  metoprolol (TOPROL-XL) 200 MG 24 hr tablet Take 1 tablet (200 mg total) by mouth daily. 05/07/16   Binnie Rail, MD  naproxen (NAPROSYN) 375 MG tablet Take 1 tablet (375 mg total) by mouth 2 (two) times daily. 01/22/16   Margarita Mail, PA-C  nitroGLYCERIN (NITROSTAT) 0.4 MG SL tablet Place 0.4 mg under the tongue every 5 (five) minutes as needed for chest pain.    Historical Provider, MD  omeprazole (PRILOSEC) 20 MG capsule Take 1 capsule (20 mg total) by mouth daily. 05/07/16   Binnie Rail, MD  oxyCODONE-acetaminophen (PERCOCET) 10-325 MG tablet Take 1 tablet by mouth every 8 (eight) hours as needed for pain. 05/18/16   Binnie Rail, MD  potassium chloride SA (K-DUR,KLOR-CON) 20 MEQ tablet TAKE 1 TABLET (20 MEQ TOTAL) BY MOUTH DAILY. 05/07/16   Binnie Rail, MD  tiZANidine (ZANAFLEX) 4 MG tablet Take 2.5 tablets (10 mg total) by mouth 3 (three) times daily. 05/07/16   Binnie Rail, MD  traZODone (DESYREL) 50 MG tablet Take 1 tablet (50 mg total) by mouth at bedtime. 05/07/16   Binnie Rail, MD  Vitamin D, Ergocalciferol, (DRISDOL) 50000 units CAPS capsule Take 1 capsule (50,000 Units total) by mouth every 7 (seven) days. 04/16/16 07/15/16  Binnie Rail, MD    Family History Family History  Problem Relation Age of Onset  . Hyperlipidemia Mother   . Hypertension Mother   . Hyperlipidemia Father   . Hypertension Father   . Stroke Father   . Hypertension Sister   . Cancer  Sister     breast cancer  . Crohn's disease Sister     Social History Social History  Substance Use Topics  . Smoking status: Current Every Day Smoker    Packs/day: 1.00    Years: 35.00    Types: Cigarettes  . Smokeless tobacco: Never Used     Comment: referred  to smoking  cessation  classes. at  [facility]   . Alcohol use No     Allergies   Patient has no known allergies.   Review of Systems Review of Systems  Constitutional: Negative for chills and fever.  HENT: Positive for sore throat. Negative  for congestion.   Respiratory: Positive for cough.   Cardiovascular: Negative.  Negative for chest pain.  Gastrointestinal: Negative.  Negative for nausea and vomiting.  Musculoskeletal: Positive for back pain.  Skin: Negative.  Negative for rash.  Neurological: Negative.  Negative for weakness.     Physical Exam Updated Vital Signs BP (!) 153/118   Pulse 68   Temp 98.9 F (37.2 C) (Oral)   Resp 20   Ht 5\' 5"  (1.651 m)   Wt (!) 140.6 kg   SpO2 97%   BMI 51.59 kg/m   Physical Exam  Constitutional: She is oriented to person, place, and time. She appears well-developed and well-nourished.  HENT:  Head: Normocephalic.  Neck: Normal range of motion. Neck supple.  Cardiovascular: Normal rate and regular rhythm.   Pulmonary/Chest: Effort normal and breath sounds normal. She has no wheezes. She has no rales. She exhibits no tenderness.  Abdominal: Soft. Bowel sounds are normal. There is no tenderness. There is no rebound and no guarding.  Musculoskeletal: Normal range of motion.       Arms: Neurological: She is alert and oriented to person, place, and time.  Skin: Skin is warm and dry. No rash noted.  Psychiatric: She has a normal mood and affect.     ED Treatments / Results  Labs (all labs ordered are listed, but only abnormal results are displayed) Labs Reviewed - No data to display  EKG  EKG Interpretation None       Radiology Dg Chest 2 View  Result  Date: 05/25/2016 CLINICAL DATA:  Cough and chest pain EXAM: CHEST  2 VIEW COMPARISON:  Chest radiograph 01/22/2016 FINDINGS: Shallow lung inflation with bibasilar atelectasis. Cardiomediastinal contours are normal. No focal consolidation or pulmonary edema. No rib fracture. IMPRESSION: Shallow lung inflation and bibasilar atelectasis. No focal airspace disease. No rib fracture. Electronically Signed   By: Ulyses Jarred M.D.   On: 05/25/2016 19:36    Procedures Procedures (including critical care time)  Medications Ordered in ED Medications - No data to display   Initial Impression / Assessment and Plan / ED Course  I have reviewed the triage vital signs and the nursing notes.  Pertinent labs & imaging results that were available during my care of the patient were reviewed by me and considered in my medical decision making (see chart for details).     Very well appearing patient is here for URI symptoms of cough, sore throat. Normal VS, CXR without pneumonia and no rib injury on left at the site of sudden pain after cough earlier today.   She has no fever and no tonsillar exudates, doubt strep throat. Also no fever, generalized aches, worse illness, doubt flu. Discussed supportive care. Will Rx Tessalon for cough and encourage PCP follow up.  Final Clinical Impressions(s) / ED Diagnoses   Final diagnoses:  Cough   1. URI 2. Muscular back pain  New Prescriptions New Prescriptions   No medications on file     Charlann Lange, Hershal Coria 05/27/16 [place], MD 05/30/16 1625

## 2016-05-28 LAB — CULTURE, GROUP A STREP (THRC)

## 2016-05-29 ENCOUNTER — Telehealth: Payer: Self-pay

## 2016-05-29 NOTE — Progress Notes (Signed)
ED Antimicrobial Stewardship Positive Culture Follow Up   Tam[place] Strickland is an 56 y.o. female who presented to [facility] on 05/25/2016 with a chief complaint of  Chief Complaint  Patient presents with  . Back Pain  . Cough    Recent Results (from the past 720 hour(s))  Rapid strep screen     Status: None   Collection Time: 05/26/16 12:25 AM  Result Value Ref Range Status   Streptococcus, Group A Screen (Direct) NEGATIVE NEGATIVE Final    Comment: (NOTE) A Rapid Antigen test may result negative if the antigen level in the sample is below the detection level of this test. The FDA has not cleared this test as a stand-alone test therefore the rapid antigen negative result has reflexed to a Group A Strep culture.   Culture, group A strep     Status: None   Collection Time: 05/26/16 12:25 AM  Result Value Ref Range Status   Specimen Description THROAT  Final   Special Requests NONE Reflexed from T970  Final   Culture FEW GROUP A STREP (S.PYOGENES) ISOLATED  Final   Report Status 05/28/2016 FINAL  Final    [x]  Patient discharged originally without antimicrobial agent and treatment is now indicated  New antibiotic prescription: Penicillin VK 500 mg twice daily for 10 days  ED Provider: Carmon Sails, PA-C   Lawson Radar 05/29/2016, 8:41 AM Infectious Diseases Pharmacist Phone# [phone]

## 2016-05-29 NOTE — Telephone Encounter (Signed)
Strep culture from 05/26/16 With no treatment needed per Carmon Sails [facility]

## 2016-06-01 ENCOUNTER — Ambulatory Visit: Payer: Commercial Managed Care - HMO | Admitting: Neurology

## 2016-06-10 ENCOUNTER — Telehealth: Payer: Self-pay | Admitting: Internal Medicine

## 2016-06-10 DIAGNOSIS — H40023 Open angle with borderline findings, high risk, bilateral: Secondary | ICD-10-CM | POA: Diagnosis not present

## 2016-06-10 NOTE — Telephone Encounter (Signed)
Please advise 

## 2016-06-10 NOTE — Telephone Encounter (Signed)
LVM informing pt of MDs response.  

## 2016-06-10 NOTE — Telephone Encounter (Signed)
I have ordered a referral for gastric bypass.  I can not prescribe her any more pain medication - she needs to get it from ortho or pain management.  We discussed in detail at her last visit that she can not get ANY pain medication from any other provider while I am prescribing her pain medication and she did recently get pain medication from someone else.

## 2016-06-10 NOTE — Telephone Encounter (Signed)
-   Was talking with pt regarding referral and she then asks if she can get a refill for her  oxyCODONE-acetaminophen (PERCOCET) 10-325 MG tablet [id number]  She would like to pick it up on Friday  - She also states the podiatrist asked her to lose 60lbs in order for surgery (?) on her foot.  She is requesting a referral for surgery for the gastric sleeve. Please advise

## 2016-06-19 ENCOUNTER — Ambulatory Visit: Payer: Commercial Managed Care - HMO | Admitting: Neurology

## 2016-06-19 ENCOUNTER — Ambulatory Visit: Payer: Commercial Managed Care - HMO | Admitting: Podiatry

## 2016-06-25 ENCOUNTER — Ambulatory Visit: Payer: Commercial Managed Care - HMO | Admitting: Podiatry

## 2016-06-29 ENCOUNTER — Telehealth: Payer: Self-pay | Admitting: *Deleted

## 2016-06-29 ENCOUNTER — Encounter: Payer: Self-pay | Admitting: Podiatry

## 2016-06-29 ENCOUNTER — Ambulatory Visit (INDEPENDENT_AMBULATORY_CARE_PROVIDER_SITE_OTHER): Payer: Medicare Other | Admitting: Podiatry

## 2016-06-29 DIAGNOSIS — M79675 Pain in left toe(s): Secondary | ICD-10-CM | POA: Diagnosis not present

## 2016-06-29 DIAGNOSIS — E1149 Type 2 diabetes mellitus with other diabetic neurological complication: Secondary | ICD-10-CM | POA: Diagnosis not present

## 2016-06-29 DIAGNOSIS — M79674 Pain in right toe(s): Secondary | ICD-10-CM

## 2016-06-29 DIAGNOSIS — B351 Tinea unguium: Secondary | ICD-10-CM | POA: Diagnosis not present

## 2016-06-29 DIAGNOSIS — G629 Polyneuropathy, unspecified: Secondary | ICD-10-CM

## 2016-06-29 NOTE — Progress Notes (Signed)
Subjective: 56 y.o. returns the office today for painful, elongated, thickened toenails which she cannot trim herself. Denies any redness or drainage around the nails. She is also waiting a referral for pain management that was ordered by her primary care physician. She still complains of numbness, burning pain to her feet and she is on 800 mg gabapentin 3 times a day without any significant improvement. She is not follow-up with neurology. Denies any acute changes since last appointment and no new complaints today. Denies any systemic complaints such as fevers, chills, nausea, vomiting.   Objective: AAO 3, NAD DP/PT pulses palpable, CRT less than 3 seconds Protective sensation decreased with Simms Weinstein monofilament Nails hypertrophic, dystrophic, elongated, brittle, discolored 10. There is tenderness overlying the nails 1-5 bilaterally. There is no surrounding erythema or drainage along the nail sites. No open lesions or pre-ulcerative lesions are identified. No other areas of tenderness bilateral lower extremities. No overlying edema, erythema, increased warmth. No pain with calf compression, swelling, warmth, erythema.  Assessment: Patient presents with symptomatic onychomycosis; neuropathy   Plan: -Treatment options including alternatives, risks, complications were discussed -Nails sharply debrided 10 without complication/bleeding. -Continue gabapentin for now. Discussed other topical medications. I will place a referral today again for neurology for evaluation for neuropathy. -Discussed daily foot inspection. If there are any changes, to call the office immediately.  -Follow-up in 3 months or sooner if any problems are to arise. In the meantime, encouraged to call the office with any questions, concerns, changes symptoms.  Celesta Gentile, DPM

## 2016-06-29 NOTE — Telephone Encounter (Addendum)
-----   Message from Trula Slade, DPM sent at 06/29/2016  9:24 AM EDT ----- Can you please put in a neurology consult for neuropathy? Thanks. Faxed referral, clinicals and demographics to [facility].

## 2016-07-09 ENCOUNTER — Other Ambulatory Visit: Payer: Self-pay | Admitting: Internal Medicine

## 2016-07-09 ENCOUNTER — Other Ambulatory Visit: Payer: Self-pay | Admitting: Physician Assistant

## 2016-07-09 DIAGNOSIS — I10 Essential (primary) hypertension: Secondary | ICD-10-CM

## 2016-07-09 NOTE — Telephone Encounter (Signed)
Should this be referred.

## 2016-07-09 NOTE — Telephone Encounter (Signed)
BP Readings from Last 3 Encounters:  05/26/16 151/95  04/16/16 134/88  03/11/16 128/82   Needs office visit for recheck. Last seen here on 11/26/2015.

## 2016-07-14 ENCOUNTER — Encounter: Payer: Self-pay | Admitting: Internal Medicine

## 2016-07-15 NOTE — Progress Notes (Signed)
Subjective:    Patient ID: Tammy Strickland, female    DOB: 25-Mar-1961, 56 y.o.   MRN: [id number]  HPI The patient is here for follow up.  Allergies:  Her allergies are bad.  She needs something for them.  She tried claritin twice a day in the past.  She is using flonase, but it dries her out.  She uses saline nasal spray.   Hypertension: She is taking her medication daily. She is compliant with a low sodium diet.  She denies chest pain, palpitations.. She is not exercising regularly.      Hyperlipidemia: She is taking her medication daily. She is compliant with a low fat/cholesterol diet. She is not exercising regularly. She denies myalgias.   Diabetes: She is taking her medication daily as prescribed. She is compliant with a diabetic diet. She is not exercising regularly. She monitors her sugars and they have been running 97-120.   GERD:  She is taking her medication daily as prescribed.  She denies any GERD symptoms and feels her GERD is well controlled.   Chronic back and knee pain:  Her pain is severe.  She is having difficulty walking.  She plans on having gastric bypass so she can have surgery.  She has lost weight.  She needs something for the pain.  She knows she can not have pain medication from more than one provider.   Medications and allergies reviewed with patient and updated if appropriate.  Patient Active Problem List   Diagnosis Date Noted  . Vitamin D deficiency 04/16/2016  . Encounter for chronic pain management 03/12/2016  . Hyperlipidemia 01/15/2016  . Gait disorder 12/04/2015  . Diabetes ([place]) 11/26/2015  . Chronic diastolic congestive heart failure ([place]) 11/06/2015  . TIA (transient ischemic attack)   . 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 Chest pain 10/31/2015  . Insomnia 10/10/2015  . Depression 10/10/2015  . Benign essential HTN 08/15/2015  . Fibromyalgia 08/15/2015  . Peripheral neuropathy ([place]) 08/15/2015  . CAD (coronary artery disease) 08/15/2015    . COPD (chronic obstructive pulmonary disease) ([place]) 08/15/2015  . Osteoporosis 08/15/2015  . 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 DDD (degenerative disc disease), lumbar 08/15/2015  . Occupational exposure to industrial toxins 08/15/2015  . GERD (gastroesophageal reflux disease) 08/15/2015    Current Outpatient Prescriptions on File Prior to Visit  Medication Sig Dispense Refill  . amitriptyline (ELAVIL) 25 MG tablet Take 2 tablets (50 mg total) by mouth at bedtime. 180 tablet 2  . aspirin EC 81 MG tablet Take 1 tablet (81 mg total) by mouth daily.    Marland Kitchen atorvastatin (LIPITOR) 20 MG tablet Take 1 tablet (20 mg total) by mouth daily. 90 tablet 2  . benzonatate (TESSALON) 100 MG capsule Take 1 capsule (100 mg total) by mouth every 8 (eight) hours. 21 capsule 0  . Blood Glucose Monitoring Suppl (TRUE METRIX AIR GLUCOSE METER) DEVI 1 Device by Does not apply route daily. Use as directed twice daily 1 Device 0  . cloNIDine (CATAPRES) 0.3 MG tablet Take 1 tablet (0.3 mg total) by mouth daily. Office visit needed for additional refills. 1st notice. 30 tablet 0  . diazepam (VALIUM) 5 MG tablet Take 1 tablet (5 mg total) by mouth 2 (two) times daily. 10 tablet 0  . fluticasone (FLONASE) 50 MCG/ACT nasal spray Place 1 spray into both nostrils daily as needed for allergies. 16 g 1  . furosemide (LASIX) 80 MG tablet Take 1 tablet (80 mg total) by  mouth daily. 90 tablet 1  . gabapentin (NEURONTIN) 800 MG tablet Take 1.5 tablets (1,200 mg total) by mouth 3 (three) times daily. 135 tablet 1  . glucose blood (TRUE METRIX BLOOD GLUCOSE TEST) test strip Use as instructed twice per day 200 each 12  . HYDROcodone-acetaminophen (NORCO/VICODIN) 5-325 MG tablet Take 1 tablet by mouth every 4 (four) hours as needed. 6 tablet 0  . Ipratropium-Albuterol (COMBIVENT) 20-100 MCG/ACT AERS respimat Inhale 1 puff into the lungs every 6 (six) hours. 3 Inhaler 1  . Lancets MISC Use as directed twice per day 200 each 11  . losartan  (COZAAR) 50 MG tablet Take 1 tablet (50 mg total) by mouth 2 (two) times daily. 180 tablet 2  . metFORMIN (GLUCOPHAGE-XR) 500 MG 24 hr tablet Take 1 tablet (500 mg total) by mouth 2 (two) times daily with a meal. 180 tablet 1  . metoprolol (TOPROL-XL) 200 MG 24 hr tablet Take 1 tablet (200 mg total) by mouth daily. 90 tablet 1  . naproxen (NAPROSYN) 375 MG tablet Take 1 tablet (375 mg total) by mouth 2 (two) times daily. 20 tablet 0  . nitroGLYCERIN (NITROSTAT) 0.4 MG SL tablet Place 0.4 mg under the tongue every 5 (five) minutes as needed for chest pain.    Marland Kitchen omeprazole (PRILOSEC) 20 MG capsule Take 1 capsule (20 mg total) by mouth daily. 90 capsule 1  . potassium chloride SA (K-DUR,KLOR-CON) 20 MEQ tablet TAKE 1 TABLET (20 MEQ TOTAL) BY MOUTH DAILY. 90 tablet 1  . tiZANidine (ZANAFLEX) 4 MG tablet Take 2.5 tablets (10 mg total) by mouth 3 (three) times daily. 270 tablet 0  . traZODone (DESYREL) 50 MG tablet Take 1 tablet (50 mg total) by mouth at bedtime. 90 tablet 0   No current facility-administered medications on file prior to visit.     Past Medical History:  Diagnosis Date  . Anxiety   . Arthritis   . CHF (congestive heart failure) ([place])   . COPD (chronic obstructive pulmonary disease) ([place])   . Coronary artery disease   . Depression   . Diabetes mellitus without complication ([place])   . GERD (gastroesophageal reflux disease)   . Hypertension   . Lupus   . Neuromuscular disorder ([place])   . Osteoporosis   . Peripheral neuropathy ([place])   . Stroke [facility])     Past Surgical History:  Procedure Laterality Date  . ABDOMINAL HYSTERECTOMY    . APPENDECTOMY    . CHOLECYSTECTOMY    . IR GENERIC HISTORICAL  11/07/2015   IR ANGIO INTRA EXTRACRAN SEL COM CAROTID INNOMINATE BILAT MOD SED 11/07/2015 Luanne Bras, MD [facility]  . IR GENERIC HISTORICAL  11/07/2015   IR ANGIO VERTEBRAL SEL SUBCLAVIAN INNOMINATE UNI R MOD SED 11/07/2015 Luanne Bras, MD [facility]  . IR GENERIC  HISTORICAL  11/07/2015   IR ANGIO VERTEBRAL SEL VERTEBRAL UNI L MOD SED 11/07/2015 Luanne Bras, MD [facility]  . IR GENERIC HISTORICAL  11/07/2015   IR ANGIOGRAM EXTREMITY LEFT 11/07/2015 Luanne Bras, MD [facility]  . TONSILLECTOMY      Social History   Social History  . Marital status: Single    Spouse name: n/a  . Number of children: 3  . Years of education: 12+   Occupational History  . 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 Smoking status: Current Every Day Smoker    Packs/day: 1.00  Years: 35.00    Types: Cigarettes  . Smokeless tobacco: Never Used     Comment: referred  to smoking  cessation  classes. at  [facility]   . Alcohol use No  . 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   Marland Kitchen Sexual activity: Yes    Partners: Female   Other Topics Concern  . Not on file   Social History Narrative   Moved to [place] from [place], [place] February 2017, to help her daughter.   Lives with her daughter.   Sons live in [place] and [place].   She reports that there were originally 17 children in her family (she is the youngest), the oldest are deceased, some prior to her birth, and she isn't sure which were female/female or how they died.    Family History  Problem Relation Age of Onset  . Hyperlipidemia Mother   . Hypertension Mother   . Hyperlipidemia Father   . Hypertension Father   . Stroke Father   . Hypertension Sister   . Cancer Sister     breast cancer  . Crohn's disease Sister     Review of Systems  Constitutional: Positive for chills. Negative for fever.  Respiratory: Positive for shortness of breath (mild). Negative for cough and wheezing.   Cardiovascular: Positive for leg swelling. Negative for chest pain and palpitations.  Musculoskeletal: Positive for arthralgias (b/l knee pain  chronic, severe) and back pain (chronic, severe).  Neurological: Positive for  light-headedness and headaches.  Psychiatric/Behavioral: The patient is nervous/anxious.        Objective:   Vitals:   07/16/16 0840  BP: 102/70  Pulse: 64  Temp: 97.7 F (36.5 C)   Wt Readings from Last 3 Encounters:  07/16/16 (!) 300 lb 1.9 oz (136.1 kg)  05/25/16 (!) 310 lb (140.6 kg)  04/16/16 (!) 312 lb (141.5 kg)   Body mass index is 49.94 kg/m.   Physical Exam    Constitutional: Appears well-developed and well-nourished. Mild distress related to chronic pain.  HENT:  Head: Normocephalic and atraumatic.  Neck: Neck supple. No tracheal deviation present. No thyromegaly present.  No cervical lymphadenopathy Cardiovascular: Normal rate, regular rhythm and normal heart sounds.   No murmur heard. No carotid bruit .  Trace b/l LE edema Pulmonary/Chest: Effort normal and breath sounds normal. No respiratory distress. No has no wheezes. No rales.  Skin: Skin is warm and dry. Not diaphoretic.  Psychiatric: Normal mood and affect. Behavior is normal.      Assessment & Plan:    See Problem List for Assessment and Plan of chronic medical problems.

## 2016-07-16 ENCOUNTER — Encounter: Payer: Self-pay | Admitting: Internal Medicine

## 2016-07-16 ENCOUNTER — Telehealth: Payer: Self-pay | Admitting: Internal Medicine

## 2016-07-16 ENCOUNTER — Ambulatory Visit (INDEPENDENT_AMBULATORY_CARE_PROVIDER_SITE_OTHER): Payer: Medicare Other | Admitting: Internal Medicine

## 2016-07-16 VITALS — BP 102/70 | HR 64 | Temp 97.7°F | Ht 65.0 in | Wt 300.1 lb

## 2016-07-16 DIAGNOSIS — J309 Allergic rhinitis, unspecified: Secondary | ICD-10-CM | POA: Insufficient documentation

## 2016-07-16 DIAGNOSIS — E119 Type 2 diabetes mellitus without complications: Secondary | ICD-10-CM

## 2016-07-16 DIAGNOSIS — M1712 Unilateral primary osteoarthritis, left knee: Secondary | ICD-10-CM

## 2016-07-16 DIAGNOSIS — J302 Other seasonal allergic rhinitis: Secondary | ICD-10-CM

## 2016-07-16 DIAGNOSIS — K219 Gastro-esophageal reflux disease without esophagitis: Secondary | ICD-10-CM

## 2016-07-16 DIAGNOSIS — I10 Essential (primary) hypertension: Secondary | ICD-10-CM

## 2016-07-16 DIAGNOSIS — M5136 Other intervertebral disc degeneration, lumbar region: Secondary | ICD-10-CM

## 2016-07-16 DIAGNOSIS — E7800 Pure hypercholesterolemia, unspecified: Secondary | ICD-10-CM

## 2016-07-16 LAB — POCT GLYCOSYLATED HEMOGLOBIN (HGB A1C): Hemoglobin A1C: 13.6

## 2016-07-16 MED ORDER — LEVOCETIRIZINE DIHYDROCHLORIDE 5 MG PO TABS: 5.0000 mg | ORAL_TABLET | Freq: Every evening | ORAL | 3 refills | Status: DC

## 2016-07-16 MED ORDER — TRAMADOL HCL 50 MG PO TABS: 100.0000 mg | ORAL_TABLET | Freq: Three times a day (TID) | ORAL | 0 refills | Status: DC | PRN

## 2016-07-16 MED ORDER — METFORMIN HCL ER 500 MG PO TB24: 1000.0000 mg | ORAL_TABLET | Freq: Two times a day (BID) | ORAL | 1 refills | Status: DC

## 2016-07-16 MED ORDER — DULAGLUTIDE 0.75 MG/0.5ML ~~LOC~~ SOAJ: 0.7500 mg | SUBCUTANEOUS | 5 refills | Status: DC

## 2016-07-16 NOTE — Patient Instructions (Addendum)
  Your a1c was checked.    Medications reviewed and updated.  Changes include trial of trulicity for your diabetes.  Your prescription(s) have been submitted to your pharmacy. Please take as directed and contact our office if you believe you are having problem(s) with the medication(s).  A referral was ordered for pain management  Please followup in 3 months

## 2016-07-16 NOTE — Assessment & Plan Note (Signed)
Severe pain Walks with cane Will prescribe tramadol only  She understands she can not get pain medications for anyone else Pain contract and UDS in two weeks at follow up

## 2016-07-16 NOTE — Assessment & Plan Note (Signed)
BP well controlled Current regimen effective and well tolerated Continue current medications at current doses  

## 2016-07-16 NOTE — Telephone Encounter (Signed)
RX faxed to [facility] 

## 2016-07-16 NOTE — Assessment & Plan Note (Signed)
Check a1c - 13.6 today Sugars well controlled at home - ? Obviously not well controlled Will increase metformin to 1000mg  twice daily Start trulicity if covered - if not will need a different medication Continue weight loss efforts

## 2016-07-16 NOTE — Assessment & Plan Note (Signed)
Using flonase Using saline spray claritin did not seem to help Trial xyzal - [facility] to [facility]

## 2016-07-16 NOTE — Assessment & Plan Note (Signed)
GERD controlled Continue daily medication  

## 2016-07-16 NOTE — Assessment & Plan Note (Signed)
Severe pain Walks with cane Difficulty getting up and walking Needs surgery, but needs to lose weight first Will prescribe tramadol only  She understands she can not get pain medications for anyone else Pain contract and UDS in two weeks at follow up

## 2016-07-16 NOTE — Progress Notes (Signed)
Pre visit review using our clinic review tool, if applicable. No additional management support is needed unless otherwise documented below in the visit note. 

## 2016-07-16 NOTE — Telephone Encounter (Signed)
Tramadol printed - have her take 3/day with 1000 mg of tylenol each time.    She needs to follow up with me in two weeks.   Her a1c is 13 - her sugars are well controlled at home - so something does not match.    Increase metformin to 2 tabs twice daily.   If trulicity is not approved we need to start a different medicaton

## 2016-07-16 NOTE — Assessment & Plan Note (Signed)
Continue daily statin. 

## 2016-07-17 NOTE — Telephone Encounter (Signed)
Tried contacting pt multiple times on 07/16/2016 and number would not go through.

## 2016-07-21 ENCOUNTER — Ambulatory Visit (INDEPENDENT_AMBULATORY_CARE_PROVIDER_SITE_OTHER): Payer: Medicare Other | Admitting: Internal Medicine

## 2016-07-21 ENCOUNTER — Encounter: Payer: Self-pay | Admitting: Emergency Medicine

## 2016-07-21 ENCOUNTER — Encounter: Payer: Self-pay | Admitting: Internal Medicine

## 2016-07-21 ENCOUNTER — Other Ambulatory Visit (INDEPENDENT_AMBULATORY_CARE_PROVIDER_SITE_OTHER): Payer: Medicare Other

## 2016-07-21 VITALS — BP 94/64 | HR 58 | Temp 97.9°F | Resp 16 | Wt 300.0 lb

## 2016-07-21 DIAGNOSIS — L299 Pruritus, unspecified: Secondary | ICD-10-CM | POA: Insufficient documentation

## 2016-07-21 DIAGNOSIS — R21 Rash and other nonspecific skin eruption: Secondary | ICD-10-CM | POA: Insufficient documentation

## 2016-07-21 DIAGNOSIS — R51 Headache: Secondary | ICD-10-CM

## 2016-07-21 DIAGNOSIS — R1031 Right lower quadrant pain: Secondary | ICD-10-CM

## 2016-07-21 DIAGNOSIS — R519 Headache, unspecified: Secondary | ICD-10-CM

## 2016-07-21 DIAGNOSIS — E119 Type 2 diabetes mellitus without complications: Secondary | ICD-10-CM

## 2016-07-21 DIAGNOSIS — G8929 Other chronic pain: Secondary | ICD-10-CM | POA: Diagnosis not present

## 2016-07-21 DIAGNOSIS — R300 Dysuria: Secondary | ICD-10-CM | POA: Diagnosis not present

## 2016-07-21 LAB — COMPREHENSIVE METABOLIC PANEL
ALT: 40 U/L — ABNORMAL HIGH (ref 0–35)
AST: 68 U/L — ABNORMAL HIGH (ref 0–37)
Albumin: 3.8 g/dL (ref 3.5–5.2)
Alkaline Phosphatase: 131 U/L — ABNORMAL HIGH (ref 39–117)
BUN: 12 mg/dL (ref 6–23)
CO2: 30 mEq/L (ref 19–32)
Calcium: 9.1 mg/dL (ref 8.4–10.5)
Chloride: 95 mEq/L — ABNORMAL LOW (ref 96–112)
Creatinine, Ser: 0.82 mg/dL (ref 0.40–1.20)
GFR: 93.32 mL/min (ref >60.00)
Glucose, Bld: 334 mg/dL — ABNORMAL HIGH (ref 70–99)
Potassium: 4.2 mEq/L (ref 3.5–5.1)
Sodium: 131 mEq/L — ABNORMAL LOW (ref 135–145)
Total Bilirubin: 0.4 mg/dL (ref 0.2–1.2)
Total Protein: 7.6 g/dL (ref 6.0–8.3)

## 2016-07-21 LAB — CBC WITH DIFFERENTIAL/PLATELET
Basophils Absolute: 0.1 10*3/uL (ref 0.0–0.1)
Basophils Relative: 0.8 % (ref 0.0–3.0)
Eosinophils Absolute: 0.2 10*3/uL (ref 0.0–0.7)
Eosinophils Relative: 3.3 % (ref 0.0–5.0)
HCT: 42.3 % (ref 36.0–46.0)
Hemoglobin: 14.4 g/dL (ref 12.0–15.0)
Lymphocytes Relative: 52.5 % — ABNORMAL HIGH (ref 12.0–46.0)
Lymphs Abs: 3.8 10*3/uL (ref 0.7–4.0)
MCHC: 33.9 g/dL (ref 30.0–36.0)
MCV: 85.6 fl (ref 78.0–100.0)
Monocytes Absolute: 0.6 10*3/uL (ref 0.1–1.0)
Monocytes Relative: 8.1 % (ref 3.0–12.0)
Neutro Abs: 2.5 10*3/uL (ref 1.4–7.7)
Neutrophils Relative %: 35.3 % — ABNORMAL LOW (ref 43.0–77.0)
Platelets: 260 10*3/uL (ref 150.0–400.0)
RBC: 4.95 Mil/uL (ref 3.87–5.11)
RDW: 14.2 % (ref 11.5–15.5)
WBC: 7.2 10*3/uL (ref 4.0–10.5)

## 2016-07-21 LAB — URINALYSIS, ROUTINE W REFLEX MICROSCOPIC
Bilirubin Urine: NEGATIVE
Hgb urine dipstick: NEGATIVE
Ketones, ur: NEGATIVE
Leukocytes, UA: NEGATIVE
Nitrite: NEGATIVE
Specific Gravity, Urine: 1.01 (ref 1.000–1.030)
Total Protein, Urine: NEGATIVE
Urine Glucose: >=1000 — AB
Urobilinogen, UA: 0.2 (ref 0.0–1.0)
pH: 6 (ref 5.0–8.0)

## 2016-07-21 MED ORDER — GLIMEPIRIDE 2 MG PO TABS: 2.0000 mg | ORAL_TABLET | Freq: Every day | ORAL | 5 refills | Status: DC

## 2016-07-21 MED ORDER — KETOROLAC TROMETHAMINE 60 MG/2ML IM SOLN
60.0000 mg | Freq: Once | INTRAMUSCULAR | Status: AC
  Administered 2016-07-21: 60 mg via INTRAMUSCULAR

## 2016-07-21 NOTE — Assessment & Plan Note (Signed)
?   Allergic reaction Will give Toradol 60 mg IM x 1

## 2016-07-21 NOTE — Assessment & Plan Note (Addendum)
Chronic pain - b/l Knee severe OA, chronic back pain Pain contract signed Referred to pain management UDS today  Will prescribe tramadol only until she gets in with pain management

## 2016-07-21 NOTE — Assessment & Plan Note (Signed)
Likely allergic reaction - given timing may be from new medication - xyzal or trulicity - stop both for now Start benadryl Check cbc, cmp Call if no improvement

## 2016-07-21 NOTE — Patient Instructions (Addendum)
  Test(s) ordered today. Your results will be released to [place] (or called to you) after review, usually within 72hours after test completion. If any changes need to be made, you will be notified at that same time.  Stop the trulicity and xyzal (allergy medication) for now.    Start glimepiride daily before breakfast. This is for your sugars.   Take benadry as needed for rash, itching.  Take according to package instructions.    You were given a toradol injection today.    Follow up with me in 3-4 weeks, sooner if needed.

## 2016-07-21 NOTE — Assessment & Plan Note (Signed)
Associated with dysuria Possible UTI - that is what she thinks it is UA, UCx Less likely renal stone, ovarian cyst, GI problem Check cbc, cmp If no improvement and not a UTI - may need Ct scan toradol today  60 mg IM x 1

## 2016-07-21 NOTE — Telephone Encounter (Signed)
Gave pt MDs response at [place] on 07/21/2016. Verified pts phone number.

## 2016-07-21 NOTE — Assessment & Plan Note (Signed)
Likely allergic reaction - on two new medications - xyzal and trulicity - will stop both Cbc, cmp Take benadryl as directed on package Call if no improvement

## 2016-07-21 NOTE — Assessment & Plan Note (Addendum)
Not well controlled Stop trulicity since she may be having a side effect Continue metformin Start glimepiride 2 mg daily Follow up in 4 weeks

## 2016-07-21 NOTE — Progress Notes (Signed)
Subjective:    Patient ID: Tammy Strickland, female    DOB: 06/14/1960, 56 y.o.   MRN: [id number]  HPI She is here for an acute visit.   Sat she started with headaches, ear pain, itching/rash from her waist up.  Her headaches is severe in nature.    On 4/5 we started her on trulcity, tramadol and xyzal.  Her symptoms started the next day. She has taken tramadol in the past, but the other two medication were new.  She denies fever, sore throat, difficulty swallowing and swelling.    RLQ pain:  It started 3 days ago.  The pain is moderate- severe.  She is having pain with urination.  She denies changes in her bowels, except for one episode of diarrhea in the past couple of days.  She denies a history of renal stones. She denies fever, but has chills.  She denies hematuria.    Headache:  She has had a headache for 4 days.  Her tramadol is not improving her headache.  She describes it as severe.  She is unsure if it is related to her above symptoms.    Chronic pain:  She is taking the tramadol and took it today.  She states it does not help with her pain.  She is waiting to hear from pain management.    Medications and allergies reviewed with patient and updated if appropriate.  Patient Active Problem List   Diagnosis Date Noted  . Allergic rhinitis 07/16/2016  . Vitamin D deficiency 04/16/2016  . Encounter for chronic pain management 03/12/2016  . Hyperlipidemia 01/15/2016  . Gait disorder 12/04/2015  . Diabetes ([place]) 11/26/2015  . Chronic diastolic congestive heart failure ([place]) 11/06/2015  . TIA (transient ischemic attack)   . 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 Chest pain 10/31/2015  . Insomnia 10/10/2015  . Depression 10/10/2015  . Benign essential HTN 08/15/2015  . Fibromyalgia 08/15/2015  . Peripheral neuropathy ([place]) 08/15/2015  . CAD (coronary artery disease) 08/15/2015  . COPD (chronic obstructive pulmonary disease) ([place]) 08/15/2015  . Osteoporosis 08/15/2015  . 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 DDD (degenerative disc disease), lumbar 08/15/2015  . Occupational exposure to industrial toxins 08/15/2015  . GERD (gastroesophageal reflux disease) 08/15/2015    Current Outpatient Prescriptions on File Prior to Visit  Medication Sig Dispense Refill  . amitriptyline (ELAVIL) 25 MG tablet Take 2 tablets (50 mg total) by mouth at bedtime. 180 tablet 2  . aspirin EC 81 MG tablet Take 1 tablet (81 mg total) by mouth daily.    Marland Kitchen atorvastatin (LIPITOR) 20 MG tablet Take 1 tablet (20 mg total) by mouth daily. 90 tablet 2  . cloNIDine (CATAPRES) 0.3 MG tablet Take 1 tablet (0.3 mg total) by mouth daily. Office visit needed for additional refills. 1st notice. 30 tablet 0  . Dulaglutide (TRULICITY) 8.67 YP/9.5KD SOPN Inject 0.75 mg into the skin once a week. 4 pen 5  . fluticasone (FLONASE) 50 MCG/ACT nasal spray Place 1 spray into both nostrils daily as needed for allergies. 16 g 1  . furosemide (LASIX) 80 MG tablet Take 1 tablet (80 mg total) by mouth daily. 90 tablet 1  . gabapentin (NEURONTIN) 800 MG tablet Take 1.5 tablets (1,200 mg total) by mouth 3 (three) times daily. 135 tablet 1  . glucose blood (TRUE METRIX BLOOD GLUCOSE TEST) test strip Use as instructed twice per day 200 each 12  . Ipratropium-Albuterol (COMBIVENT) 20-100 MCG/ACT AERS respimat Inhale 1 puff  into the lungs every 6 (six) hours. 3 Inhaler 1  . Lancets MISC Use as directed twice per day 200 each 11  . levocetirizine (XYZAL) 5 MG tablet Take 1 tablet (5 mg total) by mouth every evening. 90 tablet 3  . losartan (COZAAR) 50 MG tablet Take 1 tablet (50 mg total) by mouth 2 (two) times daily. 180 tablet 2  . metFORMIN (GLUCOPHAGE-XR) 500 MG 24 hr tablet Take 2 tablets (1,000 mg total) by mouth 2 (two) times daily with a meal. 360 tablet 1  . metoprolol (TOPROL-XL) 200 MG 24 hr tablet Take 1 tablet (200 mg total) by mouth daily. 90 tablet 1  . naproxen (NAPROSYN) 375 MG tablet Take 1 tablet (375 mg total) by  mouth 2 (two) times daily. 20 tablet 0  . nitroGLYCERIN (NITROSTAT) 0.4 MG SL tablet Place 0.4 mg under the tongue every 5 (five) minutes as needed for chest pain.    Marland Kitchen omeprazole (PRILOSEC) 20 MG capsule Take 1 capsule (20 mg total) by mouth daily. 90 capsule 1  . potassium chloride SA (K-DUR,KLOR-CON) 20 MEQ tablet TAKE 1 TABLET (20 MEQ TOTAL) BY MOUTH DAILY. 90 tablet 1  . tiZANidine (ZANAFLEX) 4 MG tablet Take 2.5 tablets (10 mg total) by mouth 3 (three) times daily. 270 tablet 0  . traMADol (ULTRAM) 50 MG tablet Take 2 tablets (100 mg total) by mouth every 8 (eight) hours as needed. 90 tablet 0  . traZODone (DESYREL) 50 MG tablet Take 1 tablet (50 mg total) by mouth at bedtime. 90 tablet 0   No current facility-administered medications on file prior to visit.     Past Medical History:  Diagnosis Date  . Anxiety   . Arthritis   . CHF (congestive heart failure) ([place])   . COPD (chronic obstructive pulmonary disease) ([place])   . Coronary artery disease   . Depression   . Diabetes mellitus without complication ([place])   . GERD (gastroesophageal reflux disease)   . Hypertension   . Lupus   . Neuromuscular disorder ([place])   . Osteoporosis   . Peripheral neuropathy ([place])   . Stroke [facility])     Past Surgical History:  Procedure Laterality Date  . ABDOMINAL HYSTERECTOMY    . APPENDECTOMY    . CHOLECYSTECTOMY    . IR GENERIC HISTORICAL  11/07/2015   IR ANGIO INTRA EXTRACRAN SEL COM CAROTID INNOMINATE BILAT MOD SED 11/07/2015 Luanne Bras, MD [facility]  . IR GENERIC HISTORICAL  11/07/2015   IR ANGIO VERTEBRAL SEL SUBCLAVIAN INNOMINATE UNI R MOD SED 11/07/2015 Luanne Bras, MD [facility]  . IR GENERIC HISTORICAL  11/07/2015   IR ANGIO VERTEBRAL SEL VERTEBRAL UNI L MOD SED 11/07/2015 Luanne Bras, MD [facility]  . IR GENERIC HISTORICAL  11/07/2015   IR ANGIOGRAM EXTREMITY LEFT 11/07/2015 Luanne Bras, MD [facility]  . TONSILLECTOMY      Social History   Social  History  . Marital status: Single    Spouse name: n/a  . Number of children: 3  . Years of education: 12+   Occupational History  . 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 Smoking status: Current Every Day Smoker    Packs/day: 1.00    Years: 35.00    Types: Cigarettes  . Smokeless tobacco: Never Used     Comment: referred  to smoking  cessation  classes. at  [facility]   . Alcohol  use No  . 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   Marland Kitchen Sexual activity: Yes    Partners: Female   Other Topics Concern  . Not on file   Social History Narrative   Moved to [place] from [place], [place] February 2017, to help her daughter.   Lives with her daughter.   Sons live in [place] and [place].   She reports that there were originally 17 children in her family (she is the youngest), the oldest are deceased, some prior to her birth, and she isn't sure which were female/female or how they died.    Family History  Problem Relation Age of Onset  . Hyperlipidemia Mother   . Hypertension Mother   . Hyperlipidemia Father   . Hypertension Father   . Stroke Father   . Hypertension Sister   . Cancer Sister     breast cancer  . Crohn's disease Sister     Review of Systems  Constitutional: Positive for chills. Negative for fever.  HENT: Positive for sore throat. Negative for trouble swallowing.   Respiratory: Positive for cough. Negative for shortness of breath and wheezing.   Cardiovascular: Positive for leg swelling (chronic). Negative for chest pain and palpitations.  Gastrointestinal: Positive for abdominal pain (RLQ radiates to back) and diarrhea (x 1). Negative for blood in stool, constipation, nausea and vomiting.       GERD  Genitourinary: Positive for dysuria. Negative for hematuria.  Skin: Positive for rash.       Itching from waist up  Neurological: Positive for headaches. Negative for  light-headedness.       Objective:   Vitals:   07/21/16 0921  BP: 94/64  Pulse: (!) 58  Resp: 16  Temp: 97.9 F (36.6 C)   Filed Weights   07/21/16 0921  Weight: 300 lb (136.1 kg)   Body mass index is 49.92 kg/m.  Wt Readings from Last 3 Encounters:  07/21/16 300 lb (136.1 kg)  07/16/16 (!) 300 lb 1.9 oz (136.1 kg)  05/25/16 (!) 310 lb (140.6 kg)     Physical Exam  Constitutional: She appears well-developed and well-nourished. No distress.  Appears drowsy  HENT:  Head: Normocephalic and atraumatic.  Mouth/Throat: Oropharynx is clear and moist.  Eyes: Conjunctivae are normal.  Neck: Neck supple. No tracheal deviation present. No thyromegaly present.  Swelling right supraclavicular region - mildly tender  Cardiovascular: Normal rate, regular rhythm and normal heart sounds.   No murmur heard. Pulmonary/Chest: Effort normal and breath sounds normal. No respiratory distress. She has no wheezes. She has no rales.  Abdominal: Soft. She exhibits no distension. There is tenderness (RLQ). There is no rebound and no guarding.  Musculoskeletal: She exhibits edema (mild b/l LE edema).  Lymphadenopathy:    She has no cervical adenopathy.  Skin: Rash (chin and neck - slightly raised, non-erythematous) noted. She is not diaphoretic.        Assessment & Plan:   See Problem List for Assessment and Plan of chronic medical problems.

## 2016-07-21 NOTE — Assessment & Plan Note (Signed)
Possible UTI UA, UCx

## 2016-07-22 ENCOUNTER — Other Ambulatory Visit: Payer: Self-pay | Admitting: Internal Medicine

## 2016-07-22 ENCOUNTER — Encounter: Payer: Self-pay | Admitting: Internal Medicine

## 2016-07-22 DIAGNOSIS — M5136 Other intervertebral disc degeneration, lumbar region: Secondary | ICD-10-CM

## 2016-07-22 DIAGNOSIS — Z79891 Long term (current) use of opiate analgesic: Secondary | ICD-10-CM | POA: Diagnosis not present

## 2016-07-22 LAB — URINE CULTURE

## 2016-07-23 DIAGNOSIS — G8929 Other chronic pain: Secondary | ICD-10-CM | POA: Diagnosis not present

## 2016-07-23 DIAGNOSIS — Z8739 Personal history of other diseases of the musculoskeletal system and connective tissue: Secondary | ICD-10-CM | POA: Diagnosis not present

## 2016-07-23 DIAGNOSIS — M25562 Pain in left knee: Secondary | ICD-10-CM | POA: Diagnosis not present

## 2016-07-23 DIAGNOSIS — M1712 Unilateral primary osteoarthritis, left knee: Secondary | ICD-10-CM | POA: Diagnosis not present

## 2016-07-27 ENCOUNTER — Telehealth: Payer: Self-pay | Admitting: Internal Medicine

## 2016-07-27 DIAGNOSIS — M5136 Other intervertebral disc degeneration, lumbar region: Secondary | ICD-10-CM

## 2016-07-27 NOTE — Telephone Encounter (Signed)
metFORMIN (GLUCOPHAGE-XR) 500 MG 24 hr tablet furosemide (LASIX) 80 MG tablet  tiZANidine (ZANAFLEX) 4 MG  Dulaglutide (TRULICITY) 7.01    Pt needs refill of these medications  Please give verbal to [phone] Reference number for this order is [id number]

## 2016-07-28 MED ORDER — TIZANIDINE HCL 4 MG PO TABS: ORAL_TABLET | ORAL | 1 refills | Status: DC

## 2016-07-28 MED ORDER — DULAGLUTIDE 0.75 MG/0.5ML ~~LOC~~ SOAJ: 0.7500 mg | SUBCUTANEOUS | 5 refills | Status: DC

## 2016-07-28 MED ORDER — FUROSEMIDE 80 MG PO TABS: 80.0000 mg | ORAL_TABLET | Freq: Every day | ORAL | 1 refills | Status: DC

## 2016-07-28 MED ORDER — METFORMIN HCL ER 500 MG PO TB24: 1000.0000 mg | ORAL_TABLET | Freq: Two times a day (BID) | ORAL | 0 refills | Status: DC

## 2016-07-28 NOTE — Telephone Encounter (Signed)
RXs have been sent to [facility].   Pt states that [facility] is not able to accept her due to her insurance, Please advise what needs to be done. Thanks!

## 2016-07-28 NOTE — Telephone Encounter (Signed)
Apparently as of this week they don't accept [employer]. It looks like she has Medicaid & they ([facility]) said they may be able to see her if she has that. She is going to check on that and call me back.

## 2016-07-29 DIAGNOSIS — M1710 Unilateral primary osteoarthritis, unspecified knee: Secondary | ICD-10-CM | POA: Diagnosis not present

## 2016-07-29 DIAGNOSIS — I10 Essential (primary) hypertension: Secondary | ICD-10-CM | POA: Diagnosis not present

## 2016-07-29 DIAGNOSIS — K219 Gastro-esophageal reflux disease without esophagitis: Secondary | ICD-10-CM | POA: Diagnosis not present

## 2016-07-29 DIAGNOSIS — E7800 Pure hypercholesterolemia, unspecified: Secondary | ICD-10-CM | POA: Diagnosis not present

## 2016-07-31 ENCOUNTER — Telehealth: Payer: Self-pay | Admitting: Internal Medicine

## 2016-07-31 DIAGNOSIS — M5136 Other intervertebral disc degeneration, lumbar region: Secondary | ICD-10-CM | POA: Diagnosis not present

## 2016-07-31 DIAGNOSIS — M47816 Spondylosis without myelopathy or radiculopathy, lumbar region: Secondary | ICD-10-CM | POA: Diagnosis not present

## 2016-07-31 NOTE — Telephone Encounter (Signed)
Pt would like a call back regarding her pain in back and legs. Pt was crying on the phone very upset. The injections are making her pain worse.

## 2016-08-03 ENCOUNTER — Other Ambulatory Visit (HOSPITAL_COMMUNITY): Payer: Self-pay | Admitting: General Surgery

## 2016-08-03 NOTE — Telephone Encounter (Signed)
Needs to see pain management - referral was ordered -can you have Cecille Rubin or [place] check on it

## 2016-08-03 NOTE — Telephone Encounter (Signed)
Please advise on pain.

## 2016-08-03 NOTE — Telephone Encounter (Signed)
Spoke with pt to advise that the referral is still in progress. Pt states she is going to stop the injections and is still taking the Tramadol with Tylenol, but this is not helping her pain. Please advise on any alternatives until she is accepted into pain clinic.

## 2016-08-04 MED ORDER — OXYCODONE-ACETAMINOPHEN 10-325 MG PO TABS: 1.0000 | ORAL_TABLET | Freq: Three times a day (TID) | ORAL | 0 refills | Status: DC | PRN

## 2016-08-04 MED FILL — OXYCODONE-ACETAMINOPHEN 10-: 10-325 | 30 days supply | Qty: 90 | Fill #0

## 2016-08-04 NOTE — Telephone Encounter (Signed)
I will prescribe her percocet to be taken 3 times a day.  She needs to take as prescribed -not more.  Remind her she can not get pain medication from anyone else -if she does I will no longer prescribe it.  I will only prescribe until she sees pain management.    rx printed.

## 2016-08-04 NOTE — Telephone Encounter (Signed)
Notified pt w/MD response. Place script up front for pick-up...Tammy Strickland

## 2016-08-05 ENCOUNTER — Telehealth: Payer: Self-pay | Admitting: Internal Medicine

## 2016-08-05 DIAGNOSIS — G8929 Other chronic pain: Secondary | ICD-10-CM

## 2016-08-05 DIAGNOSIS — M5440 Lumbago with sciatica, unspecified side: Principal | ICD-10-CM

## 2016-08-05 NOTE — Telephone Encounter (Signed)
MRI ordered

## 2016-08-05 NOTE — Telephone Encounter (Signed)
Sent pain referral to [facility] and they are requiring pt to have an MRI before they can schedule.  I spoke to pt and she is willing to have the MRI done. Can you please put in an order for this. thanks

## 2016-08-06 ENCOUNTER — Other Ambulatory Visit: Payer: Self-pay | Admitting: Internal Medicine

## 2016-08-06 NOTE — Telephone Encounter (Signed)
[employer] - No Pre Cert rqd [facility] will contact pt

## 2016-08-11 ENCOUNTER — Other Ambulatory Visit: Payer: Self-pay

## 2016-08-11 ENCOUNTER — Ambulatory Visit (HOSPITAL_COMMUNITY)
Admission: RE | Admit: 2016-08-11 | Discharge: 2016-08-11 | Disposition: A | Discharge status: Home / Self Care | Payer: Medicare Other | Source: Ambulatory Visit | Attending: General Surgery | Admitting: General Surgery

## 2016-08-11 DIAGNOSIS — R9431 Abnormal electrocardiogram [ECG] [EKG]: Secondary | ICD-10-CM | POA: Diagnosis not present

## 2016-08-11 DIAGNOSIS — K219 Gastro-esophageal reflux disease without esophagitis: Secondary | ICD-10-CM | POA: Insufficient documentation

## 2016-08-11 DIAGNOSIS — Z01818 Encounter for other preprocedural examination: Secondary | ICD-10-CM | POA: Diagnosis not present

## 2016-08-16 ENCOUNTER — Other Ambulatory Visit: Payer: Medicare Other

## 2016-08-20 ENCOUNTER — Ambulatory Visit (INDEPENDENT_AMBULATORY_CARE_PROVIDER_SITE_OTHER): Payer: Medicare Other | Admitting: Internal Medicine

## 2016-08-20 ENCOUNTER — Encounter: Payer: Self-pay | Admitting: Internal Medicine

## 2016-08-20 VITALS — BP 118/82 | HR 66 | Temp 98.4°F | Resp 16 | Wt 298.0 lb

## 2016-08-20 DIAGNOSIS — I10 Essential (primary) hypertension: Secondary | ICD-10-CM

## 2016-08-20 DIAGNOSIS — E119 Type 2 diabetes mellitus without complications: Secondary | ICD-10-CM

## 2016-08-20 DIAGNOSIS — G8929 Other chronic pain: Secondary | ICD-10-CM | POA: Diagnosis not present

## 2016-08-20 MED ORDER — OXYCODONE-ACETAMINOPHEN 10-325 MG PO TABS: 1.0000 | ORAL_TABLET | Freq: Three times a day (TID) | ORAL | 0 refills | Status: DC | PRN

## 2016-08-20 NOTE — Patient Instructions (Signed)
   Medications reviewed and updated.  No changes recommended at this time.   Please followup in 3 months    

## 2016-08-20 NOTE — Assessment & Plan Note (Addendum)
Last a1c 84.7 Started on trulicity and glimepiride Taking metformin Continue weight loss efforts Continue walking as much as possible f/u in 3 months

## 2016-08-20 NOTE — Assessment & Plan Note (Signed)
BP well controlled Current regimen effective and well tolerated Continue current medications at current doses  

## 2016-08-20 NOTE — Assessment & Plan Note (Addendum)
Indication for chronic opioid: chronic knee and back pain Medication and dose: percocet 10-325 TID # pills per month: 90 Last UDS date: 07/21/16 Pain contract signed (Y/N): Y Date narcotic database last reviewed (include red flags): 08/20/16  - last refill 08/04/16 - no red flags Refill given today  -understands she can not fill early

## 2016-08-20 NOTE — Progress Notes (Signed)
Subjective:    Patient ID: Tammy Strickland, female    DOB: 1961/04/03, 56 y.o.   MRN: [id number]  HPI The patient is here for follow up.  Chronic back and knee pain:  She has severe knee arthritis b/l and severe chronic lower back pain.  She took the tramadol and it did not help.  I did start her on percocet 10-325 every 8 hrs.  She has been referred to pain management.  She is taking gabapentin and zanaflex.  She still has significant pain, but it is better - manageable.   Her pain is 7-8/10.  She walks a little.  She drinks a lot of water.  She is trying to lose weight.  She is doing testing for gastric sleeve surgery.  She needs to see a nutritionist and to do a sleep study.  She falls asleep during the is she sits down.    Diabetes:  She started back on the trulicity and is tolerating it.  She is also taking glimepiride and metformin.  Her sugars have been controlled at home.  Her last a1c was very elevated.   Hypertension: She is taking her medication daily. She is compliant with a low sodium diet.  She denies chest pain, palpitations, shortness of breath and regular headaches. She is exercising.    Medications and allergies reviewed with patient and updated if appropriate.  Patient Active Problem List   Diagnosis Date Noted  . RLQ abdominal pain 07/21/2016  . Rash 07/21/2016  . Itching 07/21/2016  . Nonintractable headache 07/21/2016  . Dysuria 07/21/2016  . Allergic rhinitis 07/16/2016  . Vitamin D deficiency 04/16/2016  . Encounter for chronic pain management 03/12/2016  . Hyperlipidemia 01/15/2016  . Gait disorder 12/04/2015  . Diabetes ([place]) 11/26/2015  . Chronic diastolic congestive heart failure ([place]) 11/06/2015  . TIA (transient ischemic attack)   . 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 Chest pain 10/31/2015  . Insomnia 10/10/2015  . Depression 10/10/2015  . Benign essential HTN 08/15/2015  . Fibromyalgia 08/15/2015  . Peripheral neuropathy 08/15/2015  . CAD  (coronary artery disease) 08/15/2015  . COPD (chronic obstructive pulmonary disease) ([place]) 08/15/2015  . Osteoporosis 08/15/2015  . 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 DDD (degenerative disc disease), lumbar 08/15/2015  . Occupational exposure to industrial toxins 08/15/2015  . GERD (gastroesophageal reflux disease) 08/15/2015    Current Outpatient Prescriptions on File Prior to Visit  Medication Sig Dispense Refill  . amitriptyline (ELAVIL) 25 MG tablet Take 2 tablets (50 mg total) by mouth at bedtime. 180 tablet 2  . aspirin EC 81 MG tablet Take 1 tablet (81 mg total) by mouth daily.    Marland Kitchen atorvastatin (LIPITOR) 20 MG tablet Take 1 tablet (20 mg total) by mouth daily. 90 tablet 2  . cloNIDine (CATAPRES) 0.3 MG tablet Take 1 tablet (0.3 mg total) by mouth daily. Office visit needed for additional refills. 1st notice. 30 tablet 0  . Dulaglutide (TRULICITY) 4.09 WJ/1.9JY SOPN Inject 0.75 mg into the skin once a week. 4 pen 5  . fluticasone (FLONASE) 50 MCG/ACT nasal spray Place 1 spray into both nostrils daily as needed for allergies. 16 g 1  . furosemide (LASIX) 80 MG tablet Take 1 tablet (80 mg total) by mouth daily. 90 tablet 1  . gabapentin (NEURONTIN) 800 MG tablet TAKE 1 AND 1/2 TABLETS BY  MOUTH 3 TIMES DAILY 135 tablet 1  . glimepiride (AMARYL) 2 MG tablet Take 1 tablet (2 mg total) by mouth  daily before breakfast. 30 tablet 5  . glucose blood (TRUE METRIX BLOOD GLUCOSE TEST) test strip Use as instructed twice per day 200 each 12  . Ipratropium-Albuterol (COMBIVENT) 20-100 MCG/ACT AERS respimat Inhale 1 puff into the lungs every 6 (six) hours. 3 Inhaler 1  . Lancets MISC Use as directed twice per day 200 each 11  . losartan (COZAAR) 50 MG tablet Take 1 tablet (50 mg total) by mouth 2 (two) times daily. 180 tablet 2  . metFORMIN (GLUCOPHAGE-XR) 500 MG 24 hr tablet Take 2 tablets (1,000 mg total) by mouth 2 (two) times daily with a meal. 360 tablet 0  . metoprolol (TOPROL-XL) 200 MG 24 hr tablet  Take 1 tablet (200 mg total) by mouth daily. 90 tablet 1  . naproxen (NAPROSYN) 375 MG tablet Take 1 tablet (375 mg total) by mouth 2 (two) times daily. 20 tablet 0  . nitroGLYCERIN (NITROSTAT) 0.4 MG SL tablet Place 0.4 mg under the tongue every 5 (five) minutes as needed for chest pain.    Marland Kitchen omeprazole (PRILOSEC) 20 MG capsule Take 1 capsule (20 mg total) by mouth daily. 90 capsule 1  . oxyCODONE-acetaminophen (PERCOCET) 10-325 MG tablet Take 1 tablet by mouth every 8 (eight) hours as needed for pain. 90 tablet 0  . potassium chloride SA (K-DUR,KLOR-CON) 20 MEQ tablet TAKE 1 TABLET (20 MEQ TOTAL) BY MOUTH DAILY. 90 tablet 1  . tiZANidine (ZANAFLEX) 4 MG tablet TAKE 2.5 TABLETS BY MOUTH 3 TIMES DAILY. 225 tablet 1  . traZODone (DESYREL) 50 MG tablet TAKE 1 TABLET(50 MG) BY MOUTH AT BEDTIME 90 tablet 0   No current facility-administered medications on file prior to visit.     Past Medical History:  Diagnosis Date  . Anxiety   . Arthritis   . CHF (congestive heart failure) ([place])   . COPD (chronic obstructive pulmonary disease) ([place])   . Coronary artery disease   . Depression   . Diabetes mellitus without complication ([place])   . GERD (gastroesophageal reflux disease)   . Hypertension   . Lupus   . Neuromuscular disorder ([place])   . Osteoporosis   . Peripheral neuropathy   . Stroke [facility])     Past Surgical History:  Procedure Laterality Date  . ABDOMINAL HYSTERECTOMY     ovaries left  . APPENDECTOMY    . CHOLECYSTECTOMY    . IR GENERIC HISTORICAL  11/07/2015   IR ANGIO INTRA EXTRACRAN SEL COM CAROTID INNOMINATE BILAT MOD SED 11/07/2015 Luanne Bras, MD [facility]  . IR GENERIC HISTORICAL  11/07/2015   IR ANGIO VERTEBRAL SEL SUBCLAVIAN INNOMINATE UNI R MOD SED 11/07/2015 Luanne Bras, MD [facility]  . IR GENERIC HISTORICAL  11/07/2015   IR ANGIO VERTEBRAL SEL VERTEBRAL UNI L MOD SED 11/07/2015 Luanne Bras, MD [facility]  . IR GENERIC HISTORICAL  11/07/2015   IR  ANGIOGRAM EXTREMITY LEFT 11/07/2015 Luanne Bras, MD [facility]  . TONSILLECTOMY      Social History   Social History  . Marital status: Single    Spouse name: n/a  . Number of children: 3  . Years of education: 12+   Occupational History  . 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 Smoking status: Current Every Day Smoker    Packs/day: 1.00    Years: 35.00    Types: Cigarettes  . Smokeless tobacco: Never Used     Comment: referred  to smoking  cessation  classes. at  [facility]   . Alcohol use No  . 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   Marland Kitchen Sexual activity: Yes    Partners: Female   Other Topics Concern  . None   Social History Narrative   Moved to [place] from [place], [place] February 2017, to help her daughter.   Lives with her daughter.   Sons live in [place] and [place].   She reports that there were originally 17 children in her family (she is the youngest), the oldest are deceased, some prior to her birth, and she isn't sure which were female/female or how they died.    Family History  Problem Relation Age of Onset  . Hyperlipidemia Mother   . Hypertension Mother   . Hyperlipidemia Father   . Hypertension Father   . Stroke Father   . Hypertension Sister   . Cancer Sister        breast cancer  . Crohn's disease Sister     Review of Systems  Constitutional: Negative for fever.  Respiratory: Negative for cough, shortness of breath and wheezing.   Cardiovascular: Positive for leg swelling (at night). Negative for chest pain and palpitations.  Musculoskeletal: Positive for arthralgias and back pain.  Neurological: Positive for headaches (intermittent). Negative for dizziness and light-headedness.       Objective:   Vitals:   08/20/16 0808  BP: 118/82  Pulse: 66  Resp: 16  Temp: 98.4 F (36.9 C)   Wt Readings from Last 3 Encounters:  08/20/16 298 lb  (135.2 kg)  07/21/16 300 lb (136.1 kg)  07/16/16 (!) 300 lb 1.9 oz (136.1 kg)   Body mass index is 49.59 kg/m.   Physical Exam    Constitutional: Appears well-developed and well-nourished. No distress.  HENT:  Head: Normocephalic and atraumatic.  Neck: Neck supple. No tracheal deviation present. No thyromegaly present.  No cervical lymphadenopathy Cardiovascular: Normal rate, regular rhythm and normal heart sounds.   No murmur heard. No carotid bruit .  Mild b/l LE edema Pulmonary/Chest: Effort normal and breath sounds normal. No respiratory distress. No has no wheezes. No rales.  Skin: Skin is warm and dry. Not diaphoretic.  Psychiatric: Normal mood and affect. Behavior is normal.      Assessment & Plan:    See Problem List for Assessment and Plan of chronic medical problems.

## 2016-08-24 ENCOUNTER — Other Ambulatory Visit: Payer: Medicare Other

## 2016-08-28 ENCOUNTER — Inpatient Hospital Stay: Admission: RE | Admit: 2016-08-28 | Payer: Medicare Other | Source: Ambulatory Visit

## 2016-09-09 ENCOUNTER — Other Ambulatory Visit: Payer: Self-pay | Admitting: Internal Medicine

## 2016-09-09 DIAGNOSIS — I10 Essential (primary) hypertension: Secondary | ICD-10-CM

## 2016-09-10 ENCOUNTER — Ambulatory Visit
Admission: RE | Admit: 2016-09-10 | Discharge: 2016-09-10 | Disposition: A | Discharge status: Home / Self Care | Payer: Medicare Other | Source: Ambulatory Visit | Attending: Internal Medicine | Admitting: Internal Medicine

## 2016-09-10 DIAGNOSIS — M5440 Lumbago with sciatica, unspecified side: Principal | ICD-10-CM

## 2016-09-10 DIAGNOSIS — M47816 Spondylosis without myelopathy or radiculopathy, lumbar region: Secondary | ICD-10-CM | POA: Diagnosis not present

## 2016-09-10 DIAGNOSIS — G8929 Other chronic pain: Secondary | ICD-10-CM

## 2016-09-15 ENCOUNTER — Other Ambulatory Visit: Payer: Self-pay | Admitting: Internal Medicine

## 2016-09-23 NOTE — Telephone Encounter (Signed)
error 

## 2016-09-24 NOTE — Progress Notes (Signed)
Subjective:    Patient ID: Tammy Strickland, female    DOB: [birth year], 56 y.o.   MRN: [id number]  HPI The patient is here for follow up.  DDD of lumbar spine, knee OA:  She fell twice last week at home.  Her legs just gave out on her.  She thinks that happened because she did too much - she was moving to another apartment.  One time she missed a step.  She has skin abrasions on her knees.  She continues to have significant pain-She states her pain is a 25/10.  She is taking the pain medication as prescribed. She states it does help a little. She has not heard from pain management. She would like to know the results of the MRI of her back.  Diabetes: She is taking her medication daily as prescribed. She is not  compliant with a diabetic diet. She is not exercising regularly. She checks her feet daily and denies foot lesions.   Hypertension: She is taking her medication daily. She is compliant with a low sodium diet.  She denies chest pain, palpitations, shortness of breath and regular headaches. She is not exercising regularly.  She does not monitor her blood pressure at home.     Medications and allergies reviewed with patient and updated if appropriate.  Patient Active Problem List   Diagnosis Date Noted  . RLQ abdominal pain 07/21/2016  . Rash 07/21/2016  . Itching 07/21/2016  . Nonintractable headache 07/21/2016  . Dysuria 07/21/2016  . Allergic rhinitis 07/16/2016  . Vitamin D deficiency 04/16/2016  . Encounter for chronic pain management 03/12/2016  . Hyperlipidemia 01/15/2016  . Gait disorder 12/04/2015  . Diabetes ([place]) 11/26/2015  . Chronic diastolic congestive heart failure ([place]) 11/06/2015  . TIA (transient ischemic attack)   . 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 Chest pain 10/31/2015  . Insomnia 10/10/2015  . Depression 10/10/2015  . Benign essential HTN 08/15/2015  . Fibromyalgia 08/15/2015  . Peripheral neuropathy 08/15/2015  . CAD (coronary artery disease)  08/15/2015  . COPD (chronic obstructive pulmonary disease) ([place]) 08/15/2015  . Osteoporosis 08/15/2015  . 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 DDD (degenerative disc disease), lumbar 08/15/2015  . Occupational exposure to industrial toxins 08/15/2015  . GERD (gastroesophageal reflux disease) 08/15/2015    Current Outpatient Prescriptions on File Prior to Visit  Medication Sig Dispense Refill  . amitriptyline (ELAVIL) 25 MG tablet Take 2 tablets (50 mg total) by mouth at bedtime. 180 tablet 2  . aspirin EC 81 MG tablet Take 1 tablet (81 mg total) by mouth daily.    Marland Kitchen atorvastatin (LIPITOR) 20 MG tablet Take 1 tablet (20 mg total) by mouth daily. 90 tablet 2  . cloNIDine (CATAPRES) 0.3 MG tablet TAKE 1 TABLET BY MOUTH  DAILY 90 tablet 1  . Dulaglutide (TRULICITY) 9.98 PJ/8.2NK SOPN Inject 0.75 mg into the skin once a week. 4 pen 5  . fluticasone (FLONASE) 50 MCG/ACT nasal spray Place 1 spray into both nostrils daily as needed for allergies. 16 g 1  . furosemide (LASIX) 80 MG tablet Take 1 tablet (80 mg total) by mouth daily. 90 tablet 1  . gabapentin (NEURONTIN) 800 MG tablet TAKE 1 AND 1/2 TABLETS BY  MOUTH 3 TIMES DAILY 135 tablet 1  . glimepiride (AMARYL) 2 MG tablet Take 1 tablet (2 mg total) by mouth daily before breakfast. 30 tablet 5  . glucose blood (TRUE METRIX BLOOD GLUCOSE TEST) test strip Use as instructed twice per  day 200 each 12  . Ipratropium-Albuterol (COMBIVENT) 20-100 MCG/ACT AERS respimat Inhale 1 puff into the lungs every 6 (six) hours. 3 Inhaler 1  . Lancets MISC Use as directed twice per day 200 each 11  . losartan (COZAAR) 50 MG tablet Take 1 tablet (50 mg total) by mouth 2 (two) times daily. 180 tablet 2  . metFORMIN (GLUCOPHAGE-XR) 500 MG 24 hr tablet TAKE 2 TABLETS BY MOUTH 2  TIMES DAILY WITH A MEAL. 360 tablet 0  . metoprolol (TOPROL-XL) 200 MG 24 hr tablet TAKE 1 TABLET BY MOUTH  DAILY 90 tablet 1  . naproxen (NAPROSYN) 375 MG tablet Take 1 tablet (375 mg total) by mouth  2 (two) times daily. 20 tablet 0  . nitroGLYCERIN (NITROSTAT) 0.4 MG SL tablet Place 0.4 mg under the tongue every 5 (five) minutes as needed for chest pain.    Marland Kitchen omeprazole (PRILOSEC) 20 MG capsule TAKE 1 CAPSULE BY MOUTH  DAILY 90 capsule 1  . oxyCODONE-acetaminophen (PERCOCET) 10-325 MG tablet Take 1 tablet by mouth every 8 (eight) hours as needed for pain. 90 tablet 0  . potassium chloride SA (K-DUR,KLOR-CON) 20 MEQ tablet TAKE 1 TABLET BY MOUTH  DAILY 90 tablet 1  . tiZANidine (ZANAFLEX) 4 MG tablet TAKE 2.5 TABLETS BY MOUTH 3 TIMES DAILY. 225 tablet 1  . traZODone (DESYREL) 50 MG tablet TAKE 1 TABLET(50 MG) BY MOUTH AT BEDTIME 90 tablet 0   No current facility-administered medications on file prior to visit.     Past Medical History:  Diagnosis Date  . Anxiety   . Arthritis   . CHF (congestive heart failure) ([place])   . COPD (chronic obstructive pulmonary disease) ([place])   . Coronary artery disease   . Depression   . Diabetes mellitus without complication ([place])   . GERD (gastroesophageal reflux disease)   . Hypertension   . Lupus   . Neuromuscular disorder ([place])   . Osteoporosis   . Peripheral neuropathy   . Stroke [facility])     Past Surgical History:  Procedure Laterality Date  . ABDOMINAL HYSTERECTOMY     ovaries left  . APPENDECTOMY    . CHOLECYSTECTOMY    . IR GENERIC HISTORICAL  11/07/2015   IR ANGIO INTRA EXTRACRAN SEL COM CAROTID INNOMINATE BILAT MOD SED 11/07/2015 Luanne Bras, MD [facility]  . IR GENERIC HISTORICAL  11/07/2015   IR ANGIO VERTEBRAL SEL SUBCLAVIAN INNOMINATE UNI R MOD SED 11/07/2015 Luanne Bras, MD [facility]  . IR GENERIC HISTORICAL  11/07/2015   IR ANGIO VERTEBRAL SEL VERTEBRAL UNI L MOD SED 11/07/2015 Luanne Bras, MD [facility]  . IR GENERIC HISTORICAL  11/07/2015   IR ANGIOGRAM EXTREMITY LEFT 11/07/2015 Luanne Bras, MD [facility]  . TONSILLECTOMY      Social History   Social History  . Marital status: Single     Spouse name: n/a  . Number of children: 3  . Years of education: 12+   Occupational History  . 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 Smoking status: Current Every Day Smoker    Packs/day: 1.00    Years: 35.00    Types: Cigarettes  . Smokeless tobacco: Never Used     Comment: referred  to smoking  cessation  classes. at  [facility]   . Alcohol use No  . 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   Marland Kitchen  Sexual activity: Yes    Partners: Female   Other Topics Concern  . Not on file   Social History Narrative   Moved to [place] from [place], [place] February 2017, to help her daughter.   Lives with her daughter.   Sons live in [place] and [place].   She reports that there were originally 17 children in her family (she is the youngest), the oldest are deceased, some prior to her birth, and she isn't sure which were female/female or how they died.    Family History  Problem Relation Age of Onset  . Hyperlipidemia Mother   . Hypertension Mother   . Hyperlipidemia Father   . Hypertension Father   . Stroke Father   . Hypertension Sister   . Cancer Sister        breast cancer  . Crohn's disease Sister     Review of Systems  Constitutional: Negative for chills and fever.  Respiratory: Negative for cough, shortness of breath and wheezing.   Cardiovascular: Positive for leg swelling. Negative for chest pain and palpitations.  Musculoskeletal: Positive for arthralgias and back pain.  Neurological: Negative for light-headedness and headaches.       Objective:   Vitals:   09/25/16 0833  BP: (!) 142/88  Pulse: 65  Resp: 18  Temp: 97.9 F (36.6 C)   Wt Readings from Last 3 Encounters:  09/25/16 (!) 305 lb (138.3 kg)  08/20/16 298 lb (135.2 kg)  07/21/16 300 lb (136.1 kg)   Body mass index is 50.75 kg/m.   Physical Exam    Constitutional: Appears well-developed and well-nourished. No  distress.  HENT:  Head: Normocephalic and atraumatic.  Neck: Neck supple. No tracheal deviation present. No thyromegaly present.  No cervical lymphadenopathy Cardiovascular: Normal rate, regular rhythm and normal heart sounds.   No murmur heard. No carotid bruit .  Mild b/l le edema Pulmonary/Chest: Effort normal and breath sounds normal. No respiratory distress. No has no wheezes. No rales.  Skin: Skin is warm and dry. Not diaphoretic.  Psychiatric: Normal mood and affect. Behavior is normal.    MR Lumbar Spine Wo Contrast CLINICAL DATA:  Chronic low back pain radiating to the legs.  EXAM: MRI LUMBAR SPINE WITHOUT CONTRAST  TECHNIQUE: Multiplanar, multisequence MR imaging of the lumbar spine was performed. No intravenous contrast was administered.  COMPARISON:  Lumbar spine radiographs 11/26/2015  FINDINGS: Segmentation:  Standard.  Alignment:  Normal.  Vertebrae: No fracture, osseous lesion, or significant marrow edema.  Conus medullaris: Extends to the L1 level and appears normal.  Paraspinal and other soft tissues: Unremarkable.  Disc levels:  L1-2 and L2-3:  Negative.  L3-4: Disc desiccation. Mild disc bulging asymmetric to the left without stenosis.  L4-5: Mild disc desiccation. No disc herniation or stenosis. Mild facet arthrosis.  L5-S1: Shallow right foraminal to extraforaminal disc protrusion and mild facet hypertrophy result in mild right neural foraminal stenosis. No spinal stenosis.  IMPRESSION: Mild lumbar spondylosis and facet arthrosis with mild right neural foraminal stenosis at L5-S1.  Electronically Signed   By: Logan Bores M.D.   On: 09/10/2016 14:23   Assessment & Plan:    See Problem List for Assessment and Plan of chronic medical problems.

## 2016-09-25 ENCOUNTER — Ambulatory Visit (INDEPENDENT_AMBULATORY_CARE_PROVIDER_SITE_OTHER): Payer: Medicare Other | Admitting: Internal Medicine

## 2016-09-25 ENCOUNTER — Other Ambulatory Visit: Payer: Self-pay | Admitting: Internal Medicine

## 2016-09-25 ENCOUNTER — Encounter: Payer: Self-pay | Admitting: Internal Medicine

## 2016-09-25 ENCOUNTER — Other Ambulatory Visit (INDEPENDENT_AMBULATORY_CARE_PROVIDER_SITE_OTHER): Payer: Medicare Other

## 2016-09-25 VITALS — BP 142/88 | HR 65 | Temp 97.9°F | Resp 18 | Wt 305.0 lb

## 2016-09-25 DIAGNOSIS — E119 Type 2 diabetes mellitus without complications: Secondary | ICD-10-CM

## 2016-09-25 DIAGNOSIS — G8929 Other chronic pain: Secondary | ICD-10-CM

## 2016-09-25 DIAGNOSIS — M5136 Other intervertebral disc degeneration, lumbar region: Secondary | ICD-10-CM | POA: Diagnosis not present

## 2016-09-25 DIAGNOSIS — M1712 Unilateral primary osteoarthritis, left knee: Secondary | ICD-10-CM

## 2016-09-25 DIAGNOSIS — I10 Essential (primary) hypertension: Secondary | ICD-10-CM

## 2016-09-25 LAB — COMPREHENSIVE METABOLIC PANEL
ALT: 28 U/L (ref 0–35)
AST: 38 U/L — ABNORMAL HIGH (ref 0–37)
Albumin: 3.9 g/dL (ref 3.5–5.2)
Alkaline Phosphatase: 93 U/L (ref 39–117)
BUN: 13 mg/dL (ref 6–23)
CO2: 31 mEq/L (ref 19–32)
Calcium: 9.7 mg/dL (ref 8.4–10.5)
Chloride: 105 mEq/L (ref 96–112)
Creatinine, Ser: 0.81 mg/dL (ref 0.40–1.20)
GFR: 94.59 mL/min (ref >60.00)
Glucose, Bld: 141 mg/dL — ABNORMAL HIGH (ref 70–99)
Potassium: 4.2 mEq/L (ref 3.5–5.1)
Sodium: 141 mEq/L (ref 135–145)
Total Bilirubin: 0.2 mg/dL (ref 0.2–1.2)
Total Protein: 7.5 g/dL (ref 6.0–8.3)

## 2016-09-25 LAB — HEMOGLOBIN A1C: Hgb A1c MFr Bld: 10.7 % — ABNORMAL HIGH (ref 4.6–6.5)

## 2016-09-25 MED ORDER — GLUCOSE BLOOD VI STRP: ORAL_STRIP | 5 refills | Status: DC

## 2016-09-25 MED ORDER — OXYCODONE-ACETAMINOPHEN 10-325 MG PO TABS: 1.0000 | ORAL_TABLET | Freq: Three times a day (TID) | ORAL | 0 refills | Status: DC | PRN

## 2016-09-25 MED ORDER — DULAGLUTIDE 1.5 MG/0.5ML ~~LOC~~ SOAJ: 1.5000 mg | SUBCUTANEOUS | 3 refills | Status: DC

## 2016-09-25 NOTE — Assessment & Plan Note (Signed)
BP Readings from Last 3 Encounters:  09/25/16 (!) 142/88  08/20/16 118/82  07/21/16 94/64   BP well controlled Current regimen effective and well tolerated Continue current medications at current doses cmp

## 2016-09-25 NOTE — Assessment & Plan Note (Signed)
Reviewed MRI of the spine Advised her to follow-up with orthopedics Stressed weight loss We will see pain management-bleeding hear from them We will continue Percocet for now until she sees pain management

## 2016-09-25 NOTE — Assessment & Plan Note (Signed)
She needs a knee replacement, but must lose weight first Stressed rest dietary changes and decreased portions to help weight loss Awaiting pain management appointment Will continue current dose of Percocet for now

## 2016-09-25 NOTE — Assessment & Plan Note (Signed)
Check A1c Stressed diet changes-she is not compliant with a diabetic diet She is not able to exercise, but has not lost weight Stressed weight loss We will adjust medications as needed Follow-up in 3 months

## 2016-09-25 NOTE — Assessment & Plan Note (Signed)
Indication for chronic opioid: chronic back pain, b/l knee OA Medication and dose: percocet 10-325 mg TID # pills per month: 90 Last UDS date: 07/21/16 Pain contract signed (Y/N): Y Date narcotic database last reviewed (include red flags): 09/25/16, no red flags  - last filled 09/01/16, taking appropriately Refilled today with no not fill early on rx

## 2016-09-25 NOTE — Patient Instructions (Addendum)
  Test(s) ordered today. Your results will be released to MyChart (or called to you) after review, usually within 72hours after test completion. If any changes need to be made, you will be notified at that same time.   Medications reviewed and updated.  No changes recommended at this time.    Please followup in 3 months   

## 2016-10-09 ENCOUNTER — Other Ambulatory Visit: Payer: Self-pay | Admitting: Internal Medicine

## 2016-10-17 NOTE — Progress Notes (Signed)
Subjective:    Patient ID: Tammy Strickland, female    DOB: Feb 12, 1961, 56 y.o.   MRN: [id number]  HPI  Medications and allergies reviewed with patient and updated if appropriate.  Patient Active Problem List   Diagnosis Date Noted  . RLQ abdominal pain 07/21/2016  . Rash 07/21/2016  . Itching 07/21/2016  . Nonintractable headache 07/21/2016  . Dysuria 07/21/2016  . Allergic rhinitis 07/16/2016  . Vitamin D deficiency 04/16/2016  . Encounter for chronic pain management 03/12/2016  . Hyperlipidemia 01/15/2016  . Gait disorder 12/04/2015  . Diabetes ([place]) 11/26/2015  . Chronic diastolic congestive heart failure ([place]) 11/06/2015  . TIA (transient ischemic attack)   . 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 Chest pain 10/31/2015  . Insomnia 10/10/2015  . Depression 10/10/2015  . Benign essential HTN 08/15/2015  . Fibromyalgia 08/15/2015  . Peripheral neuropathy 08/15/2015  . CAD (coronary artery disease) 08/15/2015  . COPD (chronic obstructive pulmonary disease) ([place]) 08/15/2015  . Osteoporosis 08/15/2015  . 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 DDD (degenerative disc disease), lumbar 08/15/2015  . Occupational exposure to industrial toxins 08/15/2015  . GERD (gastroesophageal reflux disease) 08/15/2015    Current Outpatient Prescriptions on File Prior to Visit  Medication Sig Dispense Refill  . amitriptyline (ELAVIL) 25 MG tablet Take 2 tablets (50 mg total) by mouth at bedtime. 180 tablet 2  . aspirin EC 81 MG tablet Take 1 tablet (81 mg total) by mouth daily.    Marland Kitchen atorvastatin (LIPITOR) 20 MG tablet Take 1 tablet (20 mg total) by mouth daily. 90 tablet 2  . cloNIDine (CATAPRES) 0.3 MG tablet TAKE 1 TABLET BY MOUTH  DAILY 90 tablet 1  . Dulaglutide (TRULICITY) 1.5 HB/7.1IR SOPN Inject 1.5 mg into the skin once a week. 12 pen 3  . fluticasone (FLONASE) 50 MCG/ACT nasal spray Place 1 spray into both nostrils daily as needed for allergies. 16 g 1  . furosemide (LASIX) 80 MG tablet Take  1 tablet (80 mg total) by mouth daily. 90 tablet 1  . gabapentin (NEURONTIN) 800 MG tablet TAKE 1 AND 1/2 TABLETS BY  MOUTH 3 TIMES DAILY 270 tablet 0  . glimepiride (AMARYL) 2 MG tablet Take 1 tablet (2 mg total) by mouth daily before breakfast. 30 tablet 5  . glucose blood test strip Use as instructed to check sugar three times a day 200 each 5  . Ipratropium-Albuterol (COMBIVENT) 20-100 MCG/ACT AERS respimat Inhale 1 puff into the lungs every 6 (six) hours. 3 Inhaler 1  . Lancets MISC Use as directed twice per day 200 each 11  . losartan (COZAAR) 50 MG tablet Take 1 tablet (50 mg total) by mouth 2 (two) times daily. 180 tablet 2  . metFORMIN (GLUCOPHAGE-XR) 500 MG 24 hr tablet TAKE 2 TABLETS BY MOUTH 2  TIMES DAILY WITH A MEAL. 360 tablet 0  . metoprolol (TOPROL-XL) 200 MG 24 hr tablet TAKE 1 TABLET BY MOUTH  DAILY 90 tablet 1  . naproxen (NAPROSYN) 375 MG tablet Take 1 tablet (375 mg total) by mouth 2 (two) times daily. 20 tablet 0  . nitroGLYCERIN (NITROSTAT) 0.4 MG SL tablet Place 0.4 mg under the tongue every 5 (five) minutes as needed for chest pain.    Marland Kitchen omeprazole (PRILOSEC) 20 MG capsule TAKE 1 CAPSULE BY MOUTH  DAILY 90 capsule 1  . oxyCODONE-acetaminophen (PERCOCET) 10-325 MG tablet Take 1 tablet by mouth every 8 (eight) hours as needed for pain. 90 tablet 0  .  potassium chloride SA (K-DUR,KLOR-CON) 20 MEQ tablet TAKE 1 TABLET BY MOUTH  DAILY 90 tablet 1  . tiZANidine (ZANAFLEX) 4 MG tablet TAKE 2.5 TABLETS BY MOUTH 3 TIMES DAILY. 225 tablet 1  . traZODone (DESYREL) 50 MG tablet TAKE 1 TABLET(50 MG) BY MOUTH AT BEDTIME 90 tablet 0   No current facility-administered medications on file prior to visit.     Past Medical History:  Diagnosis Date  . Anxiety   . Arthritis   . CHF (congestive heart failure) ([place])   . COPD (chronic obstructive pulmonary disease) ([place])   . Coronary artery disease   . Depression   . Diabetes mellitus without complication ([place])   . GERD  (gastroesophageal reflux disease)   . Hypertension   . Lupus   . Neuromuscular disorder ([place])   . Osteoporosis   . Peripheral neuropathy   . Stroke [facility])     Past Surgical History:  Procedure Laterality Date  . ABDOMINAL HYSTERECTOMY     ovaries left  . APPENDECTOMY    . CHOLECYSTECTOMY    . IR GENERIC HISTORICAL  11/07/2015   IR ANGIO INTRA EXTRACRAN SEL COM CAROTID INNOMINATE BILAT MOD SED 11/07/2015 Luanne Bras, MD [facility]  . IR GENERIC HISTORICAL  11/07/2015   IR ANGIO VERTEBRAL SEL SUBCLAVIAN INNOMINATE UNI R MOD SED 11/07/2015 Luanne Bras, MD [facility]  . IR GENERIC HISTORICAL  11/07/2015   IR ANGIO VERTEBRAL SEL VERTEBRAL UNI L MOD SED 11/07/2015 Luanne Bras, MD [facility]  . IR GENERIC HISTORICAL  11/07/2015   IR ANGIOGRAM EXTREMITY LEFT 11/07/2015 Luanne Bras, MD [facility]  . TONSILLECTOMY      Social History   Social History  . Marital status: Single    Spouse name: n/a  . Number of children: 3  . Years of education: 12+   Occupational History  . 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 Smoking status: Current Every Day Smoker    Packs/day: 1.00    Years: 35.00    Types: Cigarettes  . Smokeless tobacco: Never Used     Comment: referred  to smoking  cessation  classes. at  [facility]   . Alcohol use No  . 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   Marland Kitchen Sexual activity: Yes    Partners: Female   Other Topics Concern  . Not on file   Social History Narrative   Moved to [place] from [place], [place] February 2017, to help her daughter.   Lives with her daughter.   Sons live in [place] and [place].   She reports that there were originally 17 children in her family (she is the youngest), the oldest are deceased, some prior to her birth, and she isn't sure which were female/female or how they died.    Family History  Problem Relation Age of  Onset  . Hyperlipidemia Mother   . Hypertension Mother   . Hyperlipidemia Father   . Hypertension Father   . Stroke Father   . Hypertension Sister   . Cancer Sister        breast cancer  . Crohn's disease Sister     Review of Systems     Objective:  There were no vitals filed for this visit. Wt Readings from Last 3 Encounters:  09/25/16 (!) 305 lb (138.3 kg)  08/20/16 298 lb (135.2 kg)  07/21/16 300 lb (136.1  kg)   There is no height or weight on file to calculate BMI.   Physical Exam         Assessment & Plan:    See Problem List for Assessment and Plan of chronic medical problems.    This encounter was created in error - please disregard.

## 2016-10-19 ENCOUNTER — Encounter: Payer: Medicare Other | Admitting: Internal Medicine

## 2016-10-20 ENCOUNTER — Ambulatory Visit (INDEPENDENT_AMBULATORY_CARE_PROVIDER_SITE_OTHER): Payer: 59 | Admitting: Psychiatry

## 2016-10-20 DIAGNOSIS — F509 Eating disorder, unspecified: Secondary | ICD-10-CM | POA: Diagnosis not present

## 2016-10-26 ENCOUNTER — Telehealth: Payer: Self-pay | Admitting: Internal Medicine

## 2016-10-26 MED ORDER — OXYCODONE-ACETAMINOPHEN 10-325 MG PO TABS: 1.0000 | ORAL_TABLET | Freq: Three times a day (TID) | ORAL | 0 refills | Status: DC | PRN

## 2016-10-26 MED ORDER — GLUCOSE BLOOD VI STRP: ORAL_STRIP | 5 refills | Status: DC

## 2016-10-26 MED FILL — OXYCODONE-APAP 10-325 MG TA: 10-325 | 30 days supply | Qty: 90 | Fill #0

## 2016-10-26 NOTE — Telephone Encounter (Addendum)
Patient states she needs acuvue check test strips to be sent to [facility] on [address].  Patient states she is currently out of test strips.  Patient is also requesting script on oxycodone.

## 2016-10-26 NOTE — Telephone Encounter (Signed)
Ok to fill oxycodone - rx printed

## 2016-10-26 NOTE — Telephone Encounter (Signed)
[place] Controlled Substance Database checked. Last refill 09/29/16. Please advise.

## 2016-10-26 NOTE — Telephone Encounter (Signed)
Spoke with pt to inform RX was ready for pick-up. Pt states she does not have a car and will have son-in-law come pick up the RX. [employer].

## 2016-10-29 NOTE — Progress Notes (Signed)
Pre visit review using our clinic review tool, if applicable. No additional management support is needed unless otherwise documented below in the visit note. 

## 2016-10-29 NOTE — Progress Notes (Addendum)
Subjective:   Tammy Strickland is a 56 y.o. female who presents for an Initial Medicare Annual Wellness Visit.  Review of Systems    No ROS.  Medicare Wellness Visit. Additional risk factors are reflected in the social history.   Cardiac Risk Factors include: advanced age (>48men, >45 women);diabetes mellitus;dyslipidemia;hypertension;obesity (BMI >30kg/m2);sedentary lifestyle;smoking/ tobacco exposure Sleep patterns: has daytime sleepiness, gets up 2 times nightly to void and sleeps 4-5 hours nightly.  Patient reports insomnia issues, discussed recommended sleep tips and stress reduction tips.   Home Safety/Smoke Alarms: Feels safe in home. Smoke alarms in place.  Living environment; residence and Firearm Safety: 1-story house/ trailer, equipment: Radio producer, Type: Single Point [place] and Walkers, Type: Conservation officer, nature, no firearms. Lives alone, good support system, reports needing a tub bench for safety and to maintain independence of ADLs  Seat Belt Safety/Bike Helmet: Wears seat belt.   Counseling:   Eye Exam- appointment ever 3 months, does not remember name of ophthalmologist. Dental- Resource  Female:   Pap- N/A, hysterectomy Mammo- Last, patient states she just had mammogram she will call to request results to be sent to PCP     Dexa scan- N/D      CCS- Last done around 4 years ago in [place], [place], per patient results were good.    Objective:    Today's Vitals   10/30/16 0820 10/30/16 0823  BP:  132/82  Pulse:  (!) 57  Resp:  20  SpO2:  99%  Weight:  (!) 308 lb (139.7 kg)  Height:  5\' 5"  (1.651 m)  PainSc: 6     Body mass index is 51.25 kg/m.   Current Medications (verified) Outpatient Encounter Prescriptions as of 10/30/2016  Medication Sig  . amitriptyline (ELAVIL) 25 MG tablet Take 2 tablets (50 mg total) by mouth at bedtime.  Marland Kitchen aspirin EC 81 MG tablet Take 1 tablet (81 mg total) by mouth daily.  Marland Kitchen atorvastatin (LIPITOR) 20 MG tablet Take 1 tablet (20 mg total) by  mouth daily.  . cloNIDine (CATAPRES) 0.3 MG tablet TAKE 1 TABLET BY MOUTH  DAILY  . Dulaglutide (TRULICITY) 1.5 JO/8.4ZY SOPN Inject 1.5 mg into the skin once a week.  . fluticasone (FLONASE) 50 MCG/ACT nasal spray Place 1 spray into both nostrils daily as needed for allergies.  . furosemide (LASIX) 80 MG tablet Take 1 tablet (80 mg total) by mouth daily.  Marland Kitchen gabapentin (NEURONTIN) 800 MG tablet TAKE 1 AND 1/2 TABLETS BY  MOUTH 3 TIMES DAILY  . glimepiride (AMARYL) 2 MG tablet Take 1 tablet (2 mg total) by mouth daily before breakfast.  . glucose blood test strip Use as instructed to check sugar three times a day  . Ipratropium-Albuterol (COMBIVENT) 20-100 MCG/ACT AERS respimat Inhale 1 puff into the lungs every 6 (six) hours.  . Lancets MISC Use as directed twice per day  . losartan (COZAAR) 50 MG tablet Take 1 tablet (50 mg total) by mouth 2 (two) times daily.  . metFORMIN (GLUCOPHAGE-XR) 500 MG 24 hr tablet TAKE 2 TABLETS BY MOUTH 2  TIMES DAILY WITH A MEAL.  . metoprolol (TOPROL-XL) 200 MG 24 hr tablet TAKE 1 TABLET BY MOUTH  DAILY  . naproxen (NAPROSYN) 375 MG tablet Take 1 tablet (375 mg total) by mouth 2 (two) times daily.  . nitroGLYCERIN (NITROSTAT) 0.4 MG SL tablet Place 0.4 mg under the tongue every 5 (five) minutes as needed for chest pain.  Marland Kitchen omeprazole (PRILOSEC) 20 MG capsule TAKE 1 CAPSULE  BY MOUTH  DAILY  . oxyCODONE-acetaminophen (PERCOCET) 10-325 MG tablet Take 1 tablet by mouth every 8 (eight) hours as needed for pain.  . potassium chloride SA (K-DUR,KLOR-CON) 20 MEQ tablet TAKE 1 TABLET BY MOUTH  DAILY  . tiZANidine (ZANAFLEX) 4 MG tablet TAKE 2.5 TABLETS BY MOUTH 3 TIMES DAILY.  . traZODone (DESYREL) 50 MG tablet TAKE 1 TABLET(50 MG) BY MOUTH AT BEDTIME   No facility-administered encounter medications on file as of 10/30/2016.     Allergies (verified) Patient has no known allergies.   History: Past Medical History:  Diagnosis Date  . Anxiety   . Arthritis   . CHF  (congestive heart failure) ([place])   . COPD (chronic obstructive pulmonary disease) ([place])   . Coronary artery disease   . Depression   . Diabetes mellitus without complication ([place])   . GERD (gastroesophageal reflux disease)   . Hypertension   . Lupus   . Neuromuscular disorder ([place])   . Osteoporosis   . Peripheral neuropathy   . Stroke [facility])    Past Surgical History:  Procedure Laterality Date  . ABDOMINAL HYSTERECTOMY     ovaries left  . APPENDECTOMY    . CHOLECYSTECTOMY    . IR GENERIC HISTORICAL  11/07/2015   IR ANGIO INTRA EXTRACRAN SEL COM CAROTID INNOMINATE BILAT MOD SED 11/07/2015 Luanne Bras, MD [facility]  . IR GENERIC HISTORICAL  11/07/2015   IR ANGIO VERTEBRAL SEL SUBCLAVIAN INNOMINATE UNI R MOD SED 11/07/2015 Luanne Bras, MD [facility]  . IR GENERIC HISTORICAL  11/07/2015   IR ANGIO VERTEBRAL SEL VERTEBRAL UNI L MOD SED 11/07/2015 Luanne Bras, MD [facility]  . IR GENERIC HISTORICAL  11/07/2015   IR ANGIOGRAM EXTREMITY LEFT 11/07/2015 Luanne Bras, MD [facility]  . TONSILLECTOMY     Family History  Problem Relation Age of Onset  . Hyperlipidemia Mother   . Hypertension Mother   . Hyperlipidemia Father   . Hypertension Father   . Stroke Father   . Hypertension Sister   . Cancer Sister        breast cancer  . Crohn's disease Sister    Social History   Occupational History  . 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 Smoking status: Current Every Day Smoker    Packs/day: 0.25    Years: 35.00    Types: Cigarettes  . Smokeless tobacco: Never Used     Comment: referred  to smoking  cessation  classes. at  [facility]   . Alcohol use No  . 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   Marland Kitchen Sexual activity: Yes    Partners: Female    Tobacco Counseling Ready to quit: Not Answered Counseling given: Not Answered   Activities of Daily Living In your  present state of health, do you have any difficulty performing the following activities: 10/30/2016 11/06/2015  Hearing? N N  Vision? Y N  Difficulty concentrating or making decisions? N N  Walking or climbing stairs? Y N  Dressing or bathing? Y N  Doing errands, shopping? Y N  Preparing Food and eating ? Y -  Using the Toilet? N -  In the past six months, have you accidently leaked urine? N -  Do you have problems with loss of bowel control? N -  Managing your Medications? N -  Managing your Finances? N -  Housekeeping or managing  your Housekeeping? Y -  Some recent data might be hidden    Immunizations and Health Maintenance Immunization History  Administered Date(s) Administered  . Influenza,inj,Quad PF,36+ Mos 01/15/2016  . Pneumococcal Polysaccharide-23 04/16/2016   Health Maintenance Due  Topic Date Due  . OPHTHALMOLOGY EXAM  04/02/1972  . TETANUS/TDAP  04/02/1981  . PAP SMEAR  04/03/1983  . MAMMOGRAM  04/02/2012  . COLONOSCOPY  04/02/2012    Patient Care Team: Binnie Rail, MD as PCP - General (Internal Medicine) Greer Pickerel, MD as Consulting Physician (General Surgery)  Indicate any recent Medical Services you may have received from other than [facility] providers in the past year (date may be approximate).     Assessment:   This is a routine wellness examination for Tona. Physical assessment deferred to PCP.   Hearing/Vision screen Hearing Screening Comments: Able to hear conversational tones w/o difficulty. No issues reported.  Passed whisper test  Dietary issues and exercise activities discussed: Current Exercise Habits: Home exercise routine (chair exercise pamphlets provided), Type of exercise: Other - see comments;calisthenics;walking (occassional water aerobics), Time (Minutes): 20, Frequency (Times/Week): 5, Weekly Exercise (Minutes/Week): 100, Intensity: Mild, Exercise limited by: orthopedic condition(s);respiratory conditions(s) Diet (meal preparation,  eat out, water intake, caffeinated beverages, dairy products, fruits and vegetables): in general, a "healthy" diet  , reports that she has improved her diet habits in preparation for upcoming gastric sleeve procedure. Drinks 6-8 glasses of water daily, eats a variety of fruits and vegetables daily, limits salt, fat/cholesterol, sugar, caffeine.  Reviewed heart healthy and diabetic diet, discussed importance of strictly follow bariatric diet post surgery, Diet education was attached to patient's AVS. Diet education was provided via handout.  Goals    . <enter goal here>    . quit smoking, exercise more, do better at counting carbs, do portion control with food      Depression Screen PHQ 2/9 Scores 10/30/2016 01/03/2016 11/26/2015 08/15/2015  PHQ - 2 Score 2 1 0 0  PHQ- 9 Score 6 9 - -    Fall Risk Fall Risk  10/30/2016 11/26/2015 08/15/2015  Falls in the past year? Yes Yes Yes  Number falls in past yr: 2 or more 1 2 or more  Injury with Fall? No - -  Risk for fall due to : Impaired vision;Impaired mobility;Impaired balance/gait - -    Cognitive Function:       Ad8 score reviewed for issues:  Issues making decisions: no  Less interest in hobbies / activities: no  Repeats questions, stories (family complaining): no  Trouble using ordinary gadgets (microwave, computer, phone):no  Forgets the month or year: no  Mismanaging finances: no  Remembering appts: no  Daily problems with thinking and/or memory: no Ad8 score is= 0 Screening Tests Health Maintenance  Topic Date Due  . OPHTHALMOLOGY EXAM  04/02/1972  . TETANUS/TDAP  04/02/1981  . PAP SMEAR  04/03/1983  . MAMMOGRAM  04/02/2012  . COLONOSCOPY  04/02/2012  . INFLUENZA VACCINE  11/11/2016  . HEMOGLOBIN A1C  03/27/2017  . FOOT EXAM  06/29/2017  . PNEUMOCOCCAL POLYSACCHARIDE VACCINE (2) 04/16/2021  . Hepatitis C Screening  Completed  . HIV Screening  Completed      Plan:     Order for tub bench placed for patient's  safety and to maintain independence of ADLs  Continue doing brain stimulating activities (puzzles, reading, adult coloring books, staying active) to keep memory sharp.   Continue to eat heart healthy diet (full of fruits, vegetables, whole grains,  lean protein, water--limit salt, fat, and sugar intake) and increase physical activity as tolerated.  I have personally reviewed and noted the following in the patient's chart:   . Medical and social history . Use of alcohol, tobacco or illicit drugs  . Current medications and supplements . Functional ability and status . Nutritional status . Physical activity . Advanced directives . List of other physicians . Vitals . Screenings to include cognitive, depression, and falls . Referrals and appointments  In addition, I have reviewed and discussed with patient certain preventive protocols, quality metrics, and best practice recommendations. A written personalized care plan for preventive services as well as general preventive health recommendations were provided to patient.     Michiel Cowboy, RN   10/30/2016    Medical screening examination/treatment/procedure(s) were performed by non-physician practitioner and as supervising provider I was immediately available for consultation/collaboration. I agree with treatment plan. Mauricio Po, FNP

## 2016-10-30 ENCOUNTER — Telehealth: Payer: Self-pay | Admitting: Emergency Medicine

## 2016-10-30 ENCOUNTER — Ambulatory Visit (INDEPENDENT_AMBULATORY_CARE_PROVIDER_SITE_OTHER): Payer: Medicare Other | Admitting: *Deleted

## 2016-10-30 VITALS — BP 132/82 | HR 57 | Resp 20 | Ht 65.0 in | Wt 308.0 lb

## 2016-10-30 DIAGNOSIS — M5136 Other intervertebral disc degeneration, lumbar region: Secondary | ICD-10-CM | POA: Diagnosis not present

## 2016-10-30 DIAGNOSIS — M1712 Unilateral primary osteoarthritis, left knee: Secondary | ICD-10-CM | POA: Diagnosis not present

## 2016-10-30 DIAGNOSIS — R269 Unspecified abnormalities of gait and mobility: Secondary | ICD-10-CM | POA: Diagnosis not present

## 2016-10-30 DIAGNOSIS — Z0000 Encounter for general adult medical examination without abnormal findings: Secondary | ICD-10-CM

## 2016-10-30 NOTE — Patient Instructions (Addendum)
[facility] in [place], [place] Address: [address] [place], [place] [address] Phone: [phone]  Please call the [facility] to ask them to send mammogram results to Dr. Quay Burow Patient Information . Call [phone]  Continue doing brain stimulating activities (puzzles, reading, adult coloring books, staying active) to keep memory sharp.   Continue to eat heart healthy diet (full of fruits, vegetables, whole grains, lean protein, water--limit salt, fat, and sugar intake) and increase physical activity as tolerated.  1-800-quitnow:  Steps to Quit Smoking Smoking tobacco can be bad for your health. It can also affect almost every organ in your body. Smoking puts you and people around you at risk for many serious long-lasting (chronic) diseases. Quitting smoking is hard, but it is one of the best things that you can do for your health. It is never too late to quit. What are the benefits of quitting smoking? When you quit smoking, you lower your risk for getting serious diseases and conditions. They can include:  Lung cancer or lung disease.  Heart disease.  Stroke.  Heart attack.  Not being able to have children (infertility).  Weak bones (osteoporosis) and broken bones (fractures).  If you have coughing, wheezing, and shortness of breath, those symptoms may get better when you quit. You may also get sick less often. If you are pregnant, quitting smoking can help to lower your chances of having a baby of low birth weight. What can I do to help me quit smoking? Talk with your doctor about what can help you quit smoking. Some things you can do (strategies) include:  Quitting smoking totally, instead of slowly cutting back how much you smoke over a period of time.  Going to in-person counseling. You are more likely to quit if you go to many counseling sessions.  Using resources and support systems, such as: ? Programmer, applications with a Social worker. ? Phone quitlines. ? Careers information officer. ? Support groups or group counseling. ? Text messaging programs. ? Mobile phone apps or applications.  Taking medicines. Some of these medicines may have nicotine in them. If you are pregnant or breastfeeding, do not take any medicines to quit smoking unless your doctor says it is okay. Talk with your doctor about counseling or other things that can help you.  Talk with your doctor about using more than one strategy at the same time, such as taking medicines while you are also going to in-person counseling. This can help make quitting easier. What things can I do to make it easier to quit? Quitting smoking might feel very hard at first, but there is a lot that you can do to make it easier. Take these steps:  Talk to your family and friends. Ask them to support and encourage you.  Call phone quitlines, reach out to support groups, or work with a Social worker.  Ask people who smoke to not smoke around you.  Avoid places that make you want (trigger) to smoke, such as: ? Bars. ? Parties. ? Smoke-break areas at work.  Spend time with people who do not smoke.  Lower the stress in your life. Stress can make you want to smoke. Try these things to help your stress: ? Getting regular exercise. ? Deep-breathing exercises. ? Yoga. ? Meditating. ? Doing a body scan. To do this, close your eyes, focus on one area of your body at a time from head to toe, and notice which parts of your body  are tense. Try to relax the muscles in those areas.  Download or buy apps on your mobile phone or tablet that can help you stick to your quit plan. There are many free apps, such as QuitGuide from the [employer] Office manager for Disease Control and Prevention). You can find more support from smokefree.gov and other websites.  This information is not intended to replace advice given to you by your health care provider. Make sure you discuss any  questions you have with your health care provider. Document Released: 01/24/2009 Document Revised: 11/26/2015 Document Reviewed: 08/14/2014 Elsevier Interactive Patient Education  2018 [place]. [place] , Thank you for taking time to come for your Medicare Wellness Visit. I appreciate your ongoing commitment to your health goals. Please review the following plan we discussed and let me know if I can assist you in the future.   These are the goals we discussed: Goals    . <enter goal here>    . quit smoking, exercise more, do better at counting carbs, do portion control with food       This is a list of the screening recommended for you and due dates:  Health Maintenance  Topic Date Due  . Eye exam for diabetics  04/02/1972  . Tetanus Vaccine  04/02/1981  . Pap Smear  04/03/1983  . Mammogram  04/02/2012  . Colon Cancer Screening  04/02/2012  . Flu Shot  11/11/2016  . Hemoglobin A1C  03/27/2017  . Complete foot exam   06/29/2017  . Pneumococcal vaccine (2) 04/16/2021  .  Hepatitis C: One time screening is recommended by Center for Disease Control  (CDC) for  adults born from [birth year] through 1964.   Completed  . HIV Screening  Completed   Diet Following Bariatric Surgery The bariatric diet is designed to provide fluids and nourishment while promoting weight loss and healing after bariatric surgery. The diet is divided into 3 stages. Progress to each stage of the diet with your health care provider's approval. What do I need to know about my diet following bariatric surgery? Your surgeon may have individual guidelines for you about specific foods or the progression of your diet. Follow your surgeon's guidelines. You will follow these general guidelines during all stages of your diet:  Eat at set times.  Allow 30-45 minutes for each meal.  Take small bites. Chew your food until it is almost a liquid before swallowing it. Try setting down your utensils between bites to help  yourself eat slower or make an "eat slowly" reminder sign.  Do not drink liquids for 30 minutes before meals and for 30 minutes after meals.  Drink between meals.  Stop eating when you are full. If you feel tightness or pressure in your chest, that means you are full. Wait 30 minutes before you try to eat again.  Take a chewable multivitamin daily in addition to other supplements as directed by your health care provider.  Sip at least 48-64 oz of liquid, preferably water, each day.  Stay away from concentrated sweets with more than 10 g of sugar per serving.  Protein is a very important part of the diet. Have protein with every meal when possible. Try eating your protein food first.  Stage 1 bariatric diet Stage 1 will begin after surgery and last until about 2 weeks after surgery or as directed by your health care provider. You will be on clear liquids immediately after surgery. After your health care provider approves,  you will move to full liquids. You will eat at scheduled times during the day (for example at 8 AM, 12 noon, or 5 PM). You will also take a liquid protein supplement as recommended by your dietitian. Your dietitian will let you know how much and how often you can eat. Diet Guidelines  Limit intake to  cup of solid foods and  cup of beverages per meal.  You will need at least 60-80 g of protein daily or as determined by your dietitian. Guidelines for choosing a liquid protein supplement include: ? At least 15 g of protein per 8 oz serving. ? Less than 20 g total carbohydrate per 8 oz serving. ? Less than 5 g fat per 8 oz serving.  Drink at least 48 oz (1440 mL) of fluid daily, which includes your protein supplement.  To get more protein, you can add 1 Tbsp non-fat dry milk powder to each  cup skim milk.  Avoid carbonated beverages, caffeine, alcohol, and concentrated sweets such as sugar, cakes, and cookies.  Take a chewable multivitamin complete with iron. Discuss  additional supplements with your health care provider or dietitian. Beverages ( cup total per meal)  Decaffeinated coffee or tea.  Drinks with less than 25 g of sugar per serving.  Diet or sugar-free drinks.  Powdered drinks.  Sugar-free iced tea.  Broth.  Skim milk or lactose-free milk.  Unsweetened, plain soy milk.  Sugar-free gelatin dessert or frozen ice pops. Full Liquid Foods ( cup total per meal)  Protein-rich liquids (limit added protein powder to 25-30 g per serving).  Low-fat cream soup or soup that has been blended.  Artificially sweetened yogurt.  Sugar-free pudding.  Blended low-fat cottage cheese.  Plain yogurt or [place] yogurt (low-fat).  Unsweetened applesauce.  Hot wheat cereal, cream of rice, grits. Stage 2 bariatric diet (soft or pureed diet) Stage 2 starts about 2 weeks after surgery and lasts until about 4 weeks after surgery. During this stage, you will eat soft, moist, ground, diced, or pureed foods in small meals, 3-6 times a day. Focus on protein-rich foods. You will also drink a liquid protein supplement between meals 2 times a day. After a week of soft protein foods, you can begin to add other soft foods in addition to soft proteins. You should meet with your dietitian at this stage to begin preparation for Stage 3 of the bariatric diet. Diet Guidelines  Meals should not exceed -1 cup total per meal.  You will need to blend solid foods to the consistency of applesauce.  Choose low-fat foods. Low-fat foods are foods with less than 5 g of fat per serving.  Include a protein with every meal and snack. Eat the protein food first. Try to eat 60-80 g of protein per day when possible.  Choose grains made from white or refined grain. Choices should have no more than 2 g of fiber per serving.  Continue to eat mindfully and slowly and always listen to your body.  Staying hydrated is very important during this stage. Full liquids from Stage 1 may  be used for a meal or snack replacement.  Slowly add other soft foods to your diet. Examples of soft foods that can be added to your diet are listed in the following section. Soft Protein Foods  Well-cooked beans and lentils.  Eggs (scrambled, soft-boiled).  Tofu and other soft soy products (tempeh or bean veggie burgers).  Fish.  Lean poultry, well cooked and soft. Can also try  baby food chicken or [place].  Ground meats.  Low-fat cottage cheese.  Hummus.  Fat-free or low-fat yogurt.  Gravy and light mayonnaise (to help moisten). Other Soft Foods  Soft fruit. This includes soft canned fruit in light syrup or natural juice, bananas, melons, peaches, pears, and strawberries.  Well-cooked vegetables.  Toast or crackers. Make sure these become soft by chewing them at least 20 times.  Hot wheat cereal.  Unflavored oatmeal.  Baby food or toddler fruits and vegetables.  Chicken or vegetable broth.  Blended fruit smoothies. Stage 3 bariatric diet (regular diet) This stage starts about 6-8 weeks after surgery and will continue to promote weight loss. You will be allowed to eat foods of various textures. Ask your dietitian to assist you in meal planning and additional behavioral strategies to make this final stage a long-term success. Diet Guidelines  Meals should not exceed -1 cup. As you heal and advance, you may be able to eat a little more with each meal. Always listen to your body.  Your diet should include foods from all the food groups.  Slowly add recommended foods to your diet. See the following section for a list of recommended foods.  Eat only at your chosen meal times.  When you feel full, stop eating.  Carbohydrates should be limited. Eat no more than 30 g per meal or 130 g per day. There are about 15-20 g of carbohydrates in 1 piece of bread or a medium piece of fruit.  Stay hydrated. Drink at least 48-64 oz of noncarbonated, zero-calorie fluid per day.  Water is the best choice.  At first, avoid hard-to-digest foods like popcorn, nuts, celery, seeds, and the white parts of citrus fruits. With time you may be able to eat these foods.  Take any vitamin supplements as directed by your health care provider.  Do not fast or skip meals. If you are having a hard time eating, talk to your health care provider or dietitian. What foods can I eat in stage 3? Grains Choose whole grains when possible; aim to get half of your total grains as whole grains. These include whole wheat breads, crackers, and pastas. Cold or hot cereals with no added sugars. Rice (brown or white). Vegetables Choose a variety of vegetables. All are allowed. Fruits Choose a variety of fruits. All are allowed. Meat and Other Protein Foods Choose lean sources of protein such as poultry, fish, and eggs. You may need to cook meats to tender at first. Smooth nut butters. Beans. Dairy Choose low-fat or nonfat dairy items (such as cheese, milk, and yogurt). Beverages Decaffeinated coffee. Caffeine-free tea. Sugar-free soft drinks without caffeine. Limit alcohol. Condiments All are acceptable. Choose low-fat and low-sodium when possible. Sweets and Desserts Low-fat, low-sugar options. As part of a healthy diet, everyone should limit added sugars. Fat and Oil Choose healthy fats such as olive oil, canola oil, and avocados. The items listed above may not be a complete list of recommended foods or beverages. Contact your dietitian for more options. This information is not intended to replace advice given to you by your health care provider. Make sure you discuss any questions you have with your health care provider. Document Released: 10/04/2002 Document Revised: 09/05/2015 Document Reviewed: 02/01/2013 Elsevier Interactive Patient Education  2018 [place].  Sleeve Gastrectomy, Care After Refer to this sheet in the next few weeks. These instructions provide you with information  about caring for yourself after your procedure. Your health care provider may also give  you more specific instructions. Your treatment has been planned according to current medical practices, but problems sometimes occur. Call your health care provider if you have any problems or questions after your procedure. What can I expect after the procedure? After the procedure, it is common to have:  Pain in your abdomen.  Decreased appetite.  Clear fluid leaking through the small tube (drain) that comes from your incision site.  Follow these instructions at home: Medicines  Take over-the-counter and prescription medicines only as told by your health care provider.  Do not drive for 24 hours if you received a sedative.  Do not drive or operate heavy machinery while taking prescription pain medicine. Incision and drain care   Follow instructions from your health care provider about how to take care of your incisions. Make sure you: ? Wash your hands with soap and water before you change your bandage (dressing). If soap and water are not available, use hand sanitizer. ? Change your dressing as told by your health care provider. ? Leave stitches (sutures), skin glue, or adhesive strips in place. These skin closures may need to be in place for 2 weeks or longer. If adhesive strip edges start to loosen and curl up, you may trim the loose edges. Do not remove adhesive strips completely unless your health care provider tells you to do that.  Keep the area around your incisions and your drain clean and dry.  Check your incision areas every day for signs of infection. Check for: ? More redness, swelling, or pain. ? More fluid or blood. ? Warmth. ? Pus or a bad smell.  Empty your drain every day. Follow instructions from your health care provider about recording the amount of fluid that comes from your drain. Make note of any changes in the amount or appearance of the fluid. Activity  Return to  your normal activities as told by your health care provider. Ask your health care provider what activities are safe for you.  Do not lift anything that is heavier than 10 lb (4.5 kg).  Avoid intense physical activity for as long as told by your health care provider.  Move around at least once per day, every day. As you start to feel better, you may start to exercise more. Eating and drinking  Follow instructions from your health care provider about eating or drinking restrictions. You will be given instructions about the type, the size, and the timing of your meals. ? Keep track of any foods that cause discomfort, such as bloating or cramping. ? Eat healthy foods. Avoid foods that are high in fat or sugar.  Stop eating when you feel full.  Take supplements only as told by your health care provider.  Drink enough fluid to keep your urine clear or pale yellow. General instructions  Do not take baths, swim, or use a hot tub until your health care provider approves. Ask your health care provider if you can take showers. You may only be allowed to take sponge baths for bathing.  Do not use tobacco products, including cigarettes, chewing tobacco, or e-cigarettes. If you need help quitting, ask your health care provider.  Wear compression stockings as told by your health care provider. These stockings help to prevent blood clots and reduce swelling in your legs.  Do breathing exercises as told by your health care provider.  Keep all follow-up visits as told by your health care provider. This is important. Contact a health care provider if:  You  have pain that gets worse or does not get better with medicine.  You have more redness, swelling, or pain around your incisions.  You have more fluid or blood coming from your incisions.  Your incisions feel warm to the touch.  You have pus or a bad smell coming from your incisions.  You have a fever or chills.  You have problems with your  drain.  You have green or bad-smelling fluid leaking from your drain. Get help right away if:  You have difficulty breathing.  You have severe pain, especially in your legs. This information is not intended to replace advice given to you by your health care provider. Make sure you discuss any questions you have with your health care provider. Document Released: 01/24/2009 Document Revised: 11/24/2015 Document Reviewed: 09/21/2014 Elsevier Interactive Patient Education  2018 [employer].  Bariatric Surgery Information Bariatric surgery, also called weight loss surgery, is a procedure that helps you lose weight. You may consider or your health care provider may suggest bariatric surgery if:  You are severely obese and have been unable to lose weight through diet and exercise.  You have health problems related to obesity, such as: ? Type 2 diabetes. ? Heart disease. ? Lung disease.  How does bariatric surgery help me lose weight? Bariatric surgery helps you lose weight by decreasing how much food your body absorbs. This is done by closing off part of your stomach to make it smaller. This restricts the amount of food your stomach can hold. Bariatric surgery can also change your body's regular digestive process, so that food bypasses the parts of your body that absorb calories and nutrients. If you decide to have bariatric surgery, it is important to continue to eat a healthy diet and exercise regularly after the surgery. What are the different kinds of bariatric surgery? There are two kinds of bariatric surgeries:  Restrictive surgeries make your stomach smaller. They do not change your digestive process. The smaller the size of your new stomach, the less food you can eat. There are different types of restrictive surgeries.  Malabsorptive surgeries both make your stomach smaller and alter your digestive process so that your body processes less calories and nutrients. These are the most  common kind of bariatric surgery. There are different types of malabsorptive surgeries.  What are the different types of restrictive surgery? Adjustable Gastric Banding In this procedure, an inflatable band is placed around your stomach near the upper end. This makes the passageway for food into the rest of your stomach much smaller. The band can be adjusted, making it tighter or looser, by filling it with salt solution. Your surgeon can adjust the band based on how are you feeling and how much weight you are losing. The band can be removed in the future. Vertical Banded Gastroplasty In this procedure, staples are used to separate your stomach into two parts, a small upper pouch and a bigger lower pouch. This decreases how much food you can eat. Sleeve Gastrectomy In this procedure, your stomach is made smaller. This is done by surgically removing a large part of your stomach. When your stomach is smaller, you feel full more quickly and reduce how much you eat. What are the different types of malabsorptive surgery? Roux-en-Y Gastric Bypass (RGB) This is the most common weight loss surgery. In this procedure, a small stomach pouch is created in the upper part of your stomach. Next, this small stomach pouch is attached directly to the middle part of  your small intestine. The farther down your small intestine the new connection is made, the fewer calories and nutrients you will absorb. Biliopancreatic Diversion with Duodenal Switch (BPD/DS) This is a multi-step procedure. In this procedure, a large part of your stomach is removed, making your stomach smaller. Next, this smaller stomach is attached to the lower part of your small intestine. Like the RGB surgery, you absorb fewer calories and nutrients the farther down your small intestine the attachment is made. What are the risks of bariatric surgery? As with any surgical procedure, each type of bariatric surgery has its own risks. These risks also  depend on your age, your overall health, and any other medical conditions you may have. When deciding on bariatric surgery, it is very important to:  Talk to your health care provider and choose the surgery that is best for you.  Ask your health care provider about specific risks for the surgery you choose.  Where to find more information:  American Society for Metabolic & Bariatric Surgery: www.asmbs.org  Weight-control Information Network (WIN): win.AmenCredit.is This information is not intended to replace advice given to you by your health care provider. Make sure you discuss any questions you have with your health care provider. Document Released: 03/30/2005 Document Revised: 09/05/2015 Document Reviewed: 09/28/2012 Elsevier Interactive Patient Education  [employer]. It is important to avoid accidents which may result in broken bones.  Here are a few ideas on how to make your home safer so you will be less likely to trip or fall.  1. Use nonskid mats or non slip strips in your shower or tub, on your bathroom floor and around sinks.  If you know that you have spilled water, wipe it up! 2. In the bathroom, it is important to have properly installed grab bars on the walls or on the edge of the tub.  Towel racks are NOT strong enough for you to hold onto or to pull on for support. 3. Stairs and hallways should have enough light.  Add lamps or night lights if you need ore light. 4. It is good to have handrails on both sides of the stairs if possible.  Always fix broken handrails right away. 5. It is important to see the edges of steps.  Paint the edges of outdoor steps white so you can see them better.  Put colored tape on the edge of inside steps. 6. Throw-rugs are dangerous because they can slide.  Removing the rugs is the best idea, but if they must stay, add adhesive carpet tape to prevent slipping. 7. Do not keep things on stairs or in the halls.  Remove small furniture that blocks  the halls as it may cause you to trip.  Keep telephone and electrical cords out of the way where you walk. 8. Always were sturdy, rubber-soled shoes for good support.  Never wear just socks, especially on the stairs.  Socks may cause you to slip or fall.  Do not wear full-length housecoats as you can easily trip on the bottom.  9. Place the things you use the most on the shelves that are the easiest to reach.  If you use a stepstool, make sure it is in good condition.  If you feel unsteady, DO NOT climb, ask for help. 10. If a health professional advises you to use a cane or walker, do not be ashamed.  These items can keep you from falling and breaking your bones.

## 2016-10-30 NOTE — Telephone Encounter (Signed)
LVM for pt, need to know brand of glucose test strips that she uses.

## 2016-11-03 ENCOUNTER — Other Ambulatory Visit: Payer: Self-pay | Admitting: Internal Medicine

## 2016-11-03 ENCOUNTER — Telehealth: Payer: Self-pay | Admitting: Internal Medicine

## 2016-11-03 MED ORDER — DULAGLUTIDE 1.5 MG/0.5ML ~~LOC~~ SOAJ: 1.5000 mg | SUBCUTANEOUS | 1 refills | Status: DC

## 2016-11-03 MED ORDER — LOSARTAN POTASSIUM 50 MG PO TABS: 50.0000 mg | ORAL_TABLET | Freq: Two times a day (BID) | ORAL | 1 refills | Status: DC

## 2016-11-03 MED ORDER — GLIMEPIRIDE 2 MG PO TABS: 2.0000 mg | ORAL_TABLET | Freq: Every day | ORAL | 1 refills | Status: DC

## 2016-11-03 MED ORDER — METOPROLOL SUCCINATE ER 200 MG PO TB24: 200.0000 mg | ORAL_TABLET | Freq: Every day | ORAL | 1 refills | Status: DC

## 2016-11-03 NOTE — Telephone Encounter (Signed)
Reviewed chart pt is up-to-date sent refills to [facility]../lmb  

## 2016-11-03 NOTE — Telephone Encounter (Signed)
Pt is needing a refill on Glimepride 2mg , Losartan 50mg , Metoprolol 200mg  and Trulicity 1.5mg /0.68ml sent to [facility] on [address].

## 2016-11-05 ENCOUNTER — Telehealth: Payer: Self-pay | Admitting: Emergency Medicine

## 2016-11-05 NOTE — Telephone Encounter (Signed)
PA or Trulicity was completed, received response of PA was not needed at this time. Contacted pharmacy to inform.

## 2016-11-06 ENCOUNTER — Other Ambulatory Visit: Payer: Self-pay | Admitting: Internal Medicine

## 2016-11-07 NOTE — Progress Notes (Signed)
Subjective:    Patient ID: Tammy Strickland, female    DOB: [birth year], 56 y.o.   MRN: [id number]  HPI She is here for an acute visit.   She has been on oxygen in the past when she slept or took a nap.  She was on oxygen 2005 - 2016.    Hypoxia, fatigue: Her last sleep study was in 2016 at her previous practice and she was told she no longer needed the oxygen.  It was stopped at that time.  She thinks and has been told by others she should go back on it.  She gets very sleeping during the day.  She states she was tested for sleep apnea and did not have it - just low oxygen at night.  She can fall asleep just sitting still during the day.  She does not drive.  She does get frequent headaches.    Boil:  She has had a small boil for three days in her right groin area.  She no longer feels it and thinks if may have gone away - she can not find it now.  She denies pain.  She has had boils in the past.   Morbidly obesity:  She is not exercising.  She is not losing weight.  She tries to watch what she eats, but has gained weight since she was here last.    Hypertension: She is taking her medication daily. She is compliant with a low sodium diet.  She denies chest pain, palpitations.  She is not exercising regularly.     Medications and allergies reviewed with patient and updated if appropriate.  Patient Active Problem List   Diagnosis Date Noted  . RLQ abdominal pain 07/21/2016  . Rash 07/21/2016  . Itching 07/21/2016  . Nonintractable headache 07/21/2016  . Dysuria 07/21/2016  . Allergic rhinitis 07/16/2016  . Vitamin D deficiency 04/16/2016  . Encounter for chronic pain management 03/12/2016  . Hyperlipidemia 01/15/2016  . Gait disorder 12/04/2015  . Diabetes ([place]) 11/26/2015  . Chronic diastolic congestive heart failure ([place]) 11/06/2015  . TIA (transient ischemic attack)   . 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 Chest pain 10/31/2015  . Insomnia 10/10/2015  . Depression 10/10/2015    . Benign essential HTN 08/15/2015  . Fibromyalgia 08/15/2015  . Peripheral neuropathy 08/15/2015  . CAD (coronary artery disease) 08/15/2015  . COPD (chronic obstructive pulmonary disease) ([place]) 08/15/2015  . Osteoporosis 08/15/2015  . 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 DDD (degenerative disc disease), lumbar 08/15/2015  . Occupational exposure to industrial toxins 08/15/2015  . GERD (gastroesophageal reflux disease) 08/15/2015    Current Outpatient Prescriptions on File Prior to Visit  Medication Sig Dispense Refill  . amitriptyline (ELAVIL) 25 MG tablet Take 2 tablets (50 mg total) by mouth at bedtime. 180 tablet 2  . aspirin EC 81 MG tablet Take 1 tablet (81 mg total) by mouth daily.    Marland Kitchen atorvastatin (LIPITOR) 20 MG tablet Take 1 tablet (20 mg total) by mouth daily. 90 tablet 2  . cloNIDine (CATAPRES) 0.3 MG tablet TAKE 1 TABLET BY MOUTH  DAILY 90 tablet 1  . Dulaglutide (TRULICITY) 1.5 PT/4.6FK SOPN Inject 1.5 mg into the skin once a week. 12 pen 1  . fluticasone (FLONASE) 50 MCG/ACT nasal spray Place 1 spray into both nostrils daily as needed for allergies. 16 g 1  . furosemide (LASIX) 80 MG tablet Take 1 tablet (80 mg total) by mouth daily. 90 tablet 1  .  gabapentin (NEURONTIN) 800 MG tablet TAKE 1 AND 1/2 TABLETS BY  MOUTH 3 TIMES DAILY 270 tablet 1  . glimepiride (AMARYL) 2 MG tablet Take 1 tablet (2 mg total) by mouth daily before breakfast. 90 tablet 1  . glucose blood test strip Use as instructed to check sugar three times a day 200 each 5  . Ipratropium-Albuterol (COMBIVENT) 20-100 MCG/ACT AERS respimat Inhale 1 puff into the lungs every 6 (six) hours. 3 Inhaler 1  . Lancets MISC Use as directed twice per day 200 each 11  . losartan (COZAAR) 50 MG tablet Take 1 tablet (50 mg total) by mouth 2 (two) times daily. 180 tablet 1  . metFORMIN (GLUCOPHAGE-XR) 500 MG 24 hr tablet TAKE 2 TABLETS BY MOUTH 2  TIMES DAILY WITH A MEAL. 360 tablet 0  . metoprolol (TOPROL-XL) 200 MG 24 hr tablet  Take 1 tablet (200 mg total) by mouth daily. 90 tablet 1  . naproxen (NAPROSYN) 375 MG tablet Take 1 tablet (375 mg total) by mouth 2 (two) times daily. 20 tablet 0  . nitroGLYCERIN (NITROSTAT) 0.4 MG SL tablet Place 0.4 mg under the tongue every 5 (five) minutes as needed for chest pain.    Marland Kitchen omeprazole (PRILOSEC) 20 MG capsule TAKE 1 CAPSULE BY MOUTH  DAILY 90 capsule 1  . oxyCODONE-acetaminophen (PERCOCET) 10-325 MG tablet Take 1 tablet by mouth every 8 (eight) hours as needed for pain. 90 tablet 0  . potassium chloride SA (K-DUR,KLOR-CON) 20 MEQ tablet TAKE 1 TABLET BY MOUTH  DAILY 90 tablet 1  . tiZANidine (ZANAFLEX) 4 MG tablet TAKE 2.5 TABLETS BY MOUTH 3 TIMES DAILY. 225 tablet 1  . traZODone (DESYREL) 50 MG tablet TAKE 1 TABLET(50 MG) BY MOUTH AT BEDTIME 90 tablet 0   No current facility-administered medications on file prior to visit.     Past Medical History:  Diagnosis Date  . Anxiety   . Arthritis   . CHF (congestive heart failure) ([place])   . COPD (chronic obstructive pulmonary disease) ([place])   . Coronary artery disease   . Depression   . Diabetes mellitus without complication ([place])   . GERD (gastroesophageal reflux disease)   . Hypertension   . Lupus   . Neuromuscular disorder ([place])   . Osteoporosis   . Peripheral neuropathy   . Stroke [facility])     Past Surgical History:  Procedure Laterality Date  . ABDOMINAL HYSTERECTOMY     ovaries left  . APPENDECTOMY    . CHOLECYSTECTOMY    . IR GENERIC HISTORICAL  11/07/2015   IR ANGIO INTRA EXTRACRAN SEL COM CAROTID INNOMINATE BILAT MOD SED 11/07/2015 Luanne Bras, MD [facility]  . IR GENERIC HISTORICAL  11/07/2015   IR ANGIO VERTEBRAL SEL SUBCLAVIAN INNOMINATE UNI R MOD SED 11/07/2015 Luanne Bras, MD [facility]  . IR GENERIC HISTORICAL  11/07/2015   IR ANGIO VERTEBRAL SEL VERTEBRAL UNI L MOD SED 11/07/2015 Luanne Bras, MD [facility]  . IR GENERIC HISTORICAL  11/07/2015   IR ANGIOGRAM EXTREMITY LEFT  11/07/2015 Luanne Bras, MD [facility]  . TONSILLECTOMY      Social History   Social History  . Marital status: Single    Spouse name: n/a  . Number of children: 3  . Years of education: 12+   Occupational History  . 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 Smoking status: Current Every Day Smoker  Packs/day: 0.25    Years: 35.00    Types: Cigarettes  . Smokeless tobacco: Never Used     Comment: referred  to smoking  cessation  classes. at  [facility]   . Alcohol use No  . 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   Marland Kitchen Sexual activity: Yes    Partners: Female   Other Topics Concern  . Not on file   Social History Narrative   Moved to [place] from [place], [place] February 2017, to help her daughter.   Lives with her daughter.   Sons live in [place] and [place].   She reports that there were originally 17 children in her family (she is the youngest), the oldest are deceased, some prior to her birth, and she isn't sure which were female/female or how they died.    Family History  Problem Relation Age of Onset  . Hyperlipidemia Mother   . Hypertension Mother   . Hyperlipidemia Father   . Hypertension Father   . Stroke Father   . Hypertension Sister   . Cancer Sister        breast cancer  . Crohn's disease Sister     Review of Systems  Constitutional: Positive for fatigue. Negative for chills and fever.  Respiratory: Positive for cough and wheezing. Negative for shortness of breath.   Cardiovascular: Positive for leg swelling (mild). Negative for chest pain and palpitations.  Neurological: Positive for headaches.       Objective:   Vitals:   11/09/16 1048  BP: 112/80  Pulse: (!) 55  Resp: 18  Temp: 98 F (36.7 C)   Filed Weights   11/09/16 1048  Weight: (!) 311 lb (141.1 kg)   Body mass index is 51.75 kg/m.  Wt Readings from Last 3 Encounters:  11/09/16  (!) 311 lb (141.1 kg)  10/30/16 (!) 308 lb (139.7 kg)  09/25/16 (!) 305 lb (138.3 kg)     Physical Exam Constitutional: Appears well-developed and well-nourished. No distress.  HENT:  Head: Normocephalic and atraumatic.  Neck: Neck supple. No tracheal deviation present. No thyromegaly present.  No cervical lymphadenopathy Cardiovascular: Normal rate, regular rhythm and normal heart sounds.   No murmur heard. No carotid bruit .  Trace edema Pulmonary/Chest: Effort normal and breath sounds normal. No respiratory distress. No has no wheezes. No rales.  Skin: Skin is warm and dry. Not diaphoretic.  Psychiatric: Normal mood and affect. Behavior is normal.        Assessment & Plan:   See Problem List for Assessment and Plan of chronic medical problems.

## 2016-11-09 ENCOUNTER — Encounter: Payer: Self-pay | Admitting: Internal Medicine

## 2016-11-09 ENCOUNTER — Ambulatory Visit (INDEPENDENT_AMBULATORY_CARE_PROVIDER_SITE_OTHER): Payer: Medicare Other | Admitting: Internal Medicine

## 2016-11-09 VITALS — BP 112/80 | HR 55 | Temp 98.0°F | Resp 18 | Wt 311.0 lb

## 2016-11-09 DIAGNOSIS — R5382 Chronic fatigue, unspecified: Secondary | ICD-10-CM | POA: Diagnosis not present

## 2016-11-09 DIAGNOSIS — I10 Essential (primary) hypertension: Secondary | ICD-10-CM

## 2016-11-09 DIAGNOSIS — R0902 Hypoxemia: Secondary | ICD-10-CM | POA: Insufficient documentation

## 2016-11-09 MED ORDER — GLUCOSE BLOOD VI STRP: ORAL_STRIP | 5 refills | Status: DC

## 2016-11-09 MED ORDER — LOSARTAN POTASSIUM 50 MG PO TABS: 50.0000 mg | ORAL_TABLET | Freq: Two times a day (BID) | ORAL | 1 refills | Status: DC

## 2016-11-09 MED ORDER — FUROSEMIDE 80 MG PO TABS: 80.0000 mg | ORAL_TABLET | Freq: Every day | ORAL | 1 refills | Status: DC

## 2016-11-09 MED ORDER — GABAPENTIN 800 MG PO TABS: ORAL_TABLET | ORAL | 1 refills | Status: DC

## 2016-11-09 NOTE — Patient Instructions (Addendum)
   Medications reviewed and updated. No changes recommended at this time.  Your prescription(s) have been submitted to your pharmacy. Please take as directed and contact our office if you believe you are having problem(s) with the medication(s).  Someone will contact regarding the oxygen for nighttime.  A referral was also ordered for the lung doctors to help evaluate you for sleep apnea.    Please followup as scheduled

## 2016-11-09 NOTE — Telephone Encounter (Signed)
RX cancelled with [facility] per request of pt.

## 2016-11-09 NOTE — Assessment & Plan Note (Signed)
Body mass index is 51.75 kg/m.  Stressed weight loss Exercise as much as possible Decrease portions

## 2016-11-09 NOTE — Assessment & Plan Note (Signed)
BP well controlled Current regimen effective and well tolerated Continue current medications at current doses  

## 2016-11-09 NOTE — Telephone Encounter (Signed)
Please advise, sent on 10/09/16 to mail order.

## 2016-11-09 NOTE — Assessment & Plan Note (Signed)
Likely multifactorial Possible OSA Her medications, inactivity and weight are likely contributing Discussed importance of weight loss Will have tested for OSA

## 2016-11-09 NOTE — Assessment & Plan Note (Signed)
Has been tested in the past for OSA and was told she did not have it - has had nocturnal hypoxia and was on oxygen for years - she may need oxgyen again and should be retested for OSA Will refer to pulm for OSA eval Will order nighttime oxygen and do a nocturnal oximetry test Stressed weight loss

## 2016-11-12 ENCOUNTER — Ambulatory Visit: Payer: Self-pay | Admitting: Internal Medicine

## 2016-11-17 ENCOUNTER — Ambulatory Visit: Payer: 59 | Admitting: Psychiatry

## 2016-11-17 ENCOUNTER — Encounter: Payer: Self-pay | Admitting: Internal Medicine

## 2016-11-19 ENCOUNTER — Telehealth: Payer: Self-pay | Admitting: Internal Medicine

## 2016-11-19 MED ORDER — CLONAZEPAM 0.5 MG PO TABS: 0.5000 mg | ORAL_TABLET | Freq: Two times a day (BID) | ORAL | 0 refills | Status: DC | PRN

## 2016-11-19 NOTE — Telephone Encounter (Signed)
Called pt she states she is needing something called in for her nerves. Her twin (sister) daughter died while being in the service. Her body suppose to be here sometime next week, and then she is leaving going back home so her family can burry her. Pt was crying non stop. She also states she would like to have her pain medication as well. Inform pt MD has left for today will give her a call back tomorrow w/MD response....Johny Chess

## 2016-11-19 NOTE — Telephone Encounter (Signed)
We can try clonazepam - I did print the prescription

## 2016-11-19 NOTE — Telephone Encounter (Signed)
Patient is requesting a call back.  States she just had loss in the family and has been up for two days.  Is requesting something for her nerves.

## 2016-11-20 ENCOUNTER — Telehealth: Payer: Self-pay | Admitting: Internal Medicine

## 2016-11-20 DIAGNOSIS — Z1211 Encounter for screening for malignant neoplasm of colon: Secondary | ICD-10-CM

## 2016-11-20 MED ORDER — OXYCODONE-ACETAMINOPHEN 10-325 MG PO TABS: 1.0000 | ORAL_TABLET | Freq: Three times a day (TID) | ORAL | 0 refills | Status: DC | PRN

## 2016-11-20 NOTE — Telephone Encounter (Signed)
Yes, ok 

## 2016-11-20 NOTE — Telephone Encounter (Signed)
Pt is also wanting to come pick her Oxycodone script up as well. She states she may be leaving this weekend to be with her sister. I will post fill date for 11/26/16 of its ok...Tammy Strickland

## 2016-11-20 NOTE — Telephone Encounter (Signed)
Pt called stating that she received an automated call about scheduling a colorectal screening. Can orders be put in for this or does she need to see Dr Quay Burow? She does have an appointment coming up on 12/28/16.

## 2016-11-20 NOTE — Telephone Encounter (Signed)
Called pt no answer LMOM w/MD response put rx for pain med up front and clonazepam script was faxed to [facility],,,/lmb

## 2016-11-23 NOTE — Telephone Encounter (Signed)
Is this okay to wait until her appt to discuss?

## 2016-11-23 NOTE — Telephone Encounter (Signed)
LVM informing pt

## 2016-11-23 NOTE — Telephone Encounter (Signed)
Referral ordered

## 2016-12-02 DIAGNOSIS — K219 Gastro-esophageal reflux disease without esophagitis: Secondary | ICD-10-CM | POA: Diagnosis not present

## 2016-12-02 DIAGNOSIS — E1169 Type 2 diabetes mellitus with other specified complication: Secondary | ICD-10-CM | POA: Diagnosis not present

## 2016-12-02 DIAGNOSIS — Z720 Tobacco use: Secondary | ICD-10-CM | POA: Diagnosis not present

## 2016-12-03 DIAGNOSIS — J449 Chronic obstructive pulmonary disease, unspecified: Secondary | ICD-10-CM | POA: Diagnosis not present

## 2016-12-04 DIAGNOSIS — J449 Chronic obstructive pulmonary disease, unspecified: Secondary | ICD-10-CM | POA: Diagnosis not present

## 2016-12-09 NOTE — Telephone Encounter (Signed)
12/09/2016 Received faxed medical records in inter-office mail for upcoming appointment with Dr. Percival Spanish on 12/31/2016 @ 9:40 am.  Records given to [facility]. cbr

## 2016-12-16 ENCOUNTER — Ambulatory Visit (INDEPENDENT_AMBULATORY_CARE_PROVIDER_SITE_OTHER): Payer: 59 | Admitting: Psychiatry

## 2016-12-16 DIAGNOSIS — F509 Eating disorder, unspecified: Secondary | ICD-10-CM

## 2016-12-17 ENCOUNTER — Telehealth: Payer: Self-pay | Admitting: Emergency Medicine

## 2016-12-17 NOTE — Telephone Encounter (Signed)
PA has been completed for Trulicity. Awaiting Response. Key: [id number]

## 2016-12-18 NOTE — Telephone Encounter (Signed)
Medication does not need PA, tried contacting pharmacy, unable to get an answer.

## 2016-12-24 ENCOUNTER — Encounter: Payer: Self-pay | Admitting: Skilled Nursing Facility1

## 2016-12-24 ENCOUNTER — Encounter: Payer: Medicare Other | Attending: General Surgery | Admitting: Skilled Nursing Facility1

## 2016-12-24 DIAGNOSIS — E7800 Pure hypercholesterolemia, unspecified: Secondary | ICD-10-CM | POA: Insufficient documentation

## 2016-12-24 DIAGNOSIS — I10 Essential (primary) hypertension: Secondary | ICD-10-CM | POA: Diagnosis not present

## 2016-12-24 DIAGNOSIS — M1710 Unilateral primary osteoarthritis, unspecified knee: Secondary | ICD-10-CM | POA: Insufficient documentation

## 2016-12-24 DIAGNOSIS — Z720 Tobacco use: Secondary | ICD-10-CM | POA: Insufficient documentation

## 2016-12-24 DIAGNOSIS — E119 Type 2 diabetes mellitus without complications: Secondary | ICD-10-CM

## 2016-12-24 DIAGNOSIS — K219 Gastro-esophageal reflux disease without esophagitis: Secondary | ICD-10-CM | POA: Insufficient documentation

## 2016-12-24 DIAGNOSIS — E1169 Type 2 diabetes mellitus with other specified complication: Secondary | ICD-10-CM | POA: Diagnosis not present

## 2016-12-24 DIAGNOSIS — Z713 Dietary counseling and surveillance: Secondary | ICD-10-CM | POA: Diagnosis not present

## 2016-12-24 NOTE — Progress Notes (Signed)
Pre-Op Assessment Visit:  Pre-Operative RYGB Gastrectomy Surgery  Medical Nutrition Therapy:  Appt start time: 9:00  End time:  10:30  Pt arrived complaining of back and knee pain, paying the $100 fee, and not being able to afford food. Pt states don't tell me what I need to do (fererencing her doctors and eating/working out). Pt states her chest has been hurting for the past 4 days, and states her neices body just came home forme [place], her twin tried to commit suicide. Pt states she Has had diabetes since 2000, checks her blood sugar 3 times a day not consistent from 13.6 to 10.7 A1C; drinking more water and not eating.Pt states she has Neuropathy and nerve damage. 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 Pt states she ha. Pt staets she drinks whole milk and will NOT drink any other kind. Pt states she has sleep apnea and uses oxygen at night.  Pt states she is Going to count carbs and drink water for the next appointment.   Start weight at [facility]: 300.7 BMI: 65.5in  24 hr Dietary Recall: First Meal: 1 pancake and 3 sausages  Snack:  Second Meal: hamburger with ketchup on bread Snack:  Third Meal: hamburger with ketchup on bread Snack: honey bun Beverages: whole milk, water  Encouraged to engage in 150 minutes of moderate physical activity including cardiovascular and weight baring weekly  Handouts given during visit include:   Diabetes materials  Dietitian educated the pt on the costs associated with the surgery, type 2 diabetes, and carbohydrate counting. Goals: -Always bring your meter with you everywhere you go -Always Properly dispose of your needles:  -Discard in a hard plastic/metal container with a lid (something the needle can't puncture)  -Write Do Not Recycle on the outside of the container  -Example: A laundry detergent bottle -Never use the same needle more than once -Eat 2-3  carbohydrate choices for each meal and 1 for each snack -A meal: carbohydrates, protein, vegetable -A snack: A Fruit OR Vegetable AND Protein  -Try to be more active -Always pay attention to your body keeping watchful of possible low blood sugar (below 70) or high blood sugar (above 200) -Check your feet every day looking for anything that was not there the day before  -Follow diet recommendations listed below   Energy and Macronutrient Recomendations: Calories: 1600 Carbohydrate: 180 Protein: 120 Fat: 44  Demonstrated degree of understanding via:  Teach Back  Teaching Method Utilized:  Visual Auditory Hands on  Barriers to learning/adherence to lifestyle change: medical diagnoses/pain  Patient to call the Nutrition and Diabetes Education Services to enroll in Pre-Op and Post-Op Nutrition Education when surgery date is scheduled.

## 2016-12-24 NOTE — Patient Instructions (Signed)
-  Always bring your meter with you everywhere you go -Always Properly dispose of your needles:  -Discard in a hard plastic/metal container with a lid (something the needle can't puncture)  -Write Do Not Recycle on the outside of the container  -Example: A laundry detergent bottle -Never use the same needle more than once -Eat 2-3 carbohydrate choices for each meal and 1 for each snack -A meal: carbohydrates, protein, vegetable -A snack: A Fruit OR Vegetable AND Protein  -Try to be more active -Always pay attention to your body keeping watchful of possible low blood sugar (below 70) or high blood sugar (above 200)  -Check your feet every day looking for anything that was not there the day before  

## 2016-12-25 ENCOUNTER — Institutional Professional Consult (permissible substitution): Payer: Self-pay | Admitting: Internal Medicine

## 2016-12-25 ENCOUNTER — Other Ambulatory Visit (INDEPENDENT_AMBULATORY_CARE_PROVIDER_SITE_OTHER): Payer: 59

## 2016-12-25 ENCOUNTER — Ambulatory Visit (INDEPENDENT_AMBULATORY_CARE_PROVIDER_SITE_OTHER): Payer: Medicare Other | Admitting: Internal Medicine

## 2016-12-25 ENCOUNTER — Encounter: Payer: Self-pay | Admitting: Internal Medicine

## 2016-12-25 VITALS — BP 116/84 | HR 74 | Temp 97.9°F | Ht 65.5 in | Wt 305.0 lb

## 2016-12-25 DIAGNOSIS — E119 Type 2 diabetes mellitus without complications: Secondary | ICD-10-CM

## 2016-12-25 DIAGNOSIS — Z23 Encounter for immunization: Secondary | ICD-10-CM

## 2016-12-25 DIAGNOSIS — M109 Gout, unspecified: Secondary | ICD-10-CM

## 2016-12-25 DIAGNOSIS — I639 Cerebral infarction, unspecified: Secondary | ICD-10-CM | POA: Insufficient documentation

## 2016-12-25 DIAGNOSIS — F329 Major depressive disorder, single episode, unspecified: Secondary | ICD-10-CM

## 2016-12-25 DIAGNOSIS — G8929 Other chronic pain: Secondary | ICD-10-CM

## 2016-12-25 DIAGNOSIS — F32A Depression, unspecified: Secondary | ICD-10-CM

## 2016-12-25 LAB — HEMOGLOBIN A1C: Hgb A1c MFr Bld: 7.3 % — ABNORMAL HIGH (ref 4.6–6.5)

## 2016-12-25 LAB — URIC ACID: Uric Acid, Serum: 6.9 mg/dL (ref 2.4–7.0)

## 2016-12-25 LAB — SEDIMENTATION RATE: Sed Rate: 43 mm/hr — ABNORMAL HIGH (ref 0–30)

## 2016-12-25 MED ORDER — OXYCODONE-ACETAMINOPHEN 10-325 MG PO TABS: 1.0000 | ORAL_TABLET | Freq: Three times a day (TID) | ORAL | 0 refills | Status: DC | PRN

## 2016-12-25 MED ORDER — PREDNISONE 10 MG PO TABS: ORAL_TABLET | ORAL | 0 refills | Status: DC

## 2016-12-25 MED ORDER — METHYLPREDNISOLONE ACETATE 80 MG/ML IJ SUSP
80.0000 mg | Freq: Once | INTRAMUSCULAR | Status: AC
  Administered 2016-12-25: 80 mg via INTRAMUSCULAR

## 2016-12-25 MED ORDER — ESCITALOPRAM OXALATE 10 MG PO TABS: 10.0000 mg | ORAL_TABLET | Freq: Every day | ORAL | 3 refills | Status: DC

## 2016-12-25 NOTE — Assessment & Plan Note (Signed)
stable overall by history and exam, recent data reviewed with pt, and pt to continue medical treatment as before,  to f/u any worsening symptoms or concerns Lab Results  Component Value Date   HGBA1C 7.3 (H) 12/25/2016  pt to call for onset polys or cbg > 200 with tx

## 2016-12-25 NOTE — Assessment & Plan Note (Signed)
Stable, for med refill 

## 2016-12-25 NOTE — Assessment & Plan Note (Signed)
Mild to mod, for lexapro 10 qd, declines referral for counseling, to f/u any worsening symptoms or concerns

## 2016-12-25 NOTE — Patient Instructions (Addendum)
You had the steroid shot today, and the flu shot today  Please take all new medication as prescribed - the prednisone for the gout, and lexapro for the depression  Please continue all other medications as before, and refills have been done if requested - the pain medication  Please have the pharmacy call with any other refills you may need.  Please continue your efforts at being more active, low cholesterol diabetic diet, and weight control.  Please keep your appointments with your specialists as you may have planned  Please go to the LAB in the Basement (turn left off the elevator) for the tests to be done today  You will be contacted by phone if any changes need to be made immediately.  Otherwise, you will receive a letter about your results with an explanation, but please check with MyChart first.  Please remember to sign up for MyChart if you have not done so, as this will be important to you in the future with finding out test results, communicating by private email, and scheduling acute appointments online when needed.  Please return in 2 weeks to see Dr Quay Burow

## 2016-12-25 NOTE — Progress Notes (Signed)
Subjective:    Patient ID: Tammy Strickland, female    DOB: March 12, 1961, 56 y.o.   MRN: [id number]  HPI  Here to f/u with me today as PCP not here, pt with acute on chronic pain with marked severe bilat hand and finger pain and sweling x 2 days, woke up with it, but no recent hx of trauma or fever.  No prior hx of gout, pseudogout.  Wonders if she has new onset RA, denies feet or other new joint pain or swelling.   Pt denies chest pain, increasing sob or doe, wheezing, orthopnea, PND, increased LE swelling, palpitations, dizziness or syncope.  Pt denies new neurological symptoms such as new headache, or facial or extremity weakness or numbness.  Pt denies polydipsia, polyuria, or low sugar episode.  Pt states overall good compliance with meds, mostly trying to follow appropriate diet, with wt overall stable,  but little exercise however. CBG's are in the lower 100's on current meds, diet, activity and wt.  Her biological twin sister's duaghter body will arrive in the [place] from [place] early next wk.  Aunt died last wk. Much stress.  Twin sister tried to commit suicide last wk.  Pt is tearful many times, has low mood, denies SI or HI, and asks for depresison tx. Chronic pain overall stable, asks for refill med.  No hx of abuse, dependence or misuse Past Medical History:  Diagnosis Date  . Anxiety   . Arthritis   . CHF (congestive heart failure) ([place])   . COPD (chronic obstructive pulmonary disease) ([place])   . Coronary artery disease   . Depression   . Diabetes mellitus without complication ([place])   . GERD (gastroesophageal reflux disease)   . Hypertension   . Lupus   . Neuromuscular disorder ([place])   . Osteoporosis   . Peripheral neuropathy   . Stroke [facility])    Past Surgical History:  Procedure Laterality Date  . ABDOMINAL HYSTERECTOMY     ovaries left  . APPENDECTOMY    . CHOLECYSTECTOMY    . IR GENERIC HISTORICAL  11/07/2015   IR ANGIO INTRA EXTRACRAN SEL COM CAROTID INNOMINATE BILAT MOD SED  11/07/2015 Luanne Bras, MD [facility]  . IR GENERIC HISTORICAL  11/07/2015   IR ANGIO VERTEBRAL SEL SUBCLAVIAN INNOMINATE UNI R MOD SED 11/07/2015 Luanne Bras, MD [facility]  . IR GENERIC HISTORICAL  11/07/2015   IR ANGIO VERTEBRAL SEL VERTEBRAL UNI L MOD SED 11/07/2015 Luanne Bras, MD [facility]  . IR GENERIC HISTORICAL  11/07/2015   IR ANGIOGRAM EXTREMITY LEFT 11/07/2015 Luanne Bras, MD [facility]  . TONSILLECTOMY      reports that she has been smoking Cigarettes.  She has a 8.75 pack-year smoking history. She has never used smokeless tobacco. She reports that she does not drink alcohol or use drugs. family history includes Cancer in her sister; Crohn's disease in her sister; Hyperlipidemia in her father and mother; Hypertension in her father, mother, and sister; Stroke in her father. No Known Allergies Current Outpatient Prescriptions on File Prior to Visit  Medication Sig Dispense Refill  . amitriptyline (ELAVIL) 25 MG tablet Take 2 tablets (50 mg total) by mouth at bedtime. 180 tablet 2  . aspirin EC 81 MG tablet Take 1 tablet (81 mg total) by mouth daily.    Marland Kitchen atorvastatin (LIPITOR) 20 MG tablet Take 1 tablet (20 mg total) by mouth daily. 90 tablet 2  . clonazePAM (KLONOPIN) 0.5 MG tablet Take 1 tablet (0.5 mg  total) by mouth 2 (two) times daily as needed for anxiety. 20 tablet 0  . cloNIDine (CATAPRES) 0.3 MG tablet TAKE 1 TABLET BY MOUTH  DAILY 90 tablet 1  . Dulaglutide (TRULICITY) 1.5 EX/5.2WU SOPN Inject 1.5 mg into the skin once a week. 12 pen 1  . fluticasone (FLONASE) 50 MCG/ACT nasal spray Place 1 spray into both nostrils daily as needed for allergies. 16 g 1  . furosemide (LASIX) 80 MG tablet Take 1 tablet (80 mg total) by mouth daily. 90 tablet 1  . gabapentin (NEURONTIN) 800 MG tablet TAKE 1 AND 1/2 TABLETS BY  MOUTH 3 TIMES DAILY 270 tablet 1  . glimepiride (AMARYL) 2 MG tablet Take 1 tablet (2 mg total) by mouth daily before breakfast. 90 tablet  1  . glucose blood test strip Use as instructed to check sugar three times a day 200 each 5  . Ipratropium-Albuterol (COMBIVENT) 20-100 MCG/ACT AERS respimat Inhale 1 puff into the lungs every 6 (six) hours. 3 Inhaler 1  . Lancets MISC Use as directed twice per day 200 each 11  . losartan (COZAAR) 50 MG tablet Take 1 tablet (50 mg total) by mouth 2 (two) times daily. 180 tablet 1  . metFORMIN (GLUCOPHAGE-XR) 500 MG 24 hr tablet TAKE 2 TABLETS BY MOUTH 2  TIMES DAILY WITH A MEAL. 360 tablet 0  . metoprolol (TOPROL-XL) 200 MG 24 hr tablet Take 1 tablet (200 mg total) by mouth daily. 90 tablet 1  . naproxen (NAPROSYN) 375 MG tablet Take 1 tablet (375 mg total) by mouth 2 (two) times daily. 20 tablet 0  . nitroGLYCERIN (NITROSTAT) 0.4 MG SL tablet Place 0.4 mg under the tongue every 5 (five) minutes as needed for chest pain.    Marland Kitchen omeprazole (PRILOSEC) 20 MG capsule TAKE 1 CAPSULE BY MOUTH  DAILY 90 capsule 1  . potassium chloride SA (K-DUR,KLOR-CON) 20 MEQ tablet TAKE 1 TABLET BY MOUTH  DAILY 90 tablet 1  . tiZANidine (ZANAFLEX) 4 MG tablet TAKE 2.5 TABLETS BY MOUTH 3 TIMES DAILY. 225 tablet 1  . traZODone (DESYREL) 50 MG tablet TAKE 1 TABLET(50 MG) BY MOUTH AT BEDTIME 90 tablet 0   No current facility-administered medications on file prior to visit.     Review of Systems  Constitutional: Negative for other unusual diaphoresis or sweats HENT: Negative for ear discharge or swelling Eyes: Negative for other worsening visual disturbances Respiratory: Negative for stridor or other swelling  Gastrointestinal: Negative for worsening distension or other blood Genitourinary: Negative for retention or other urinary change Musculoskeletal: Negative for other MSK pain or swelling Skin: Negative for color change or other new lesions Neurological: Negative for worsening tremors and other numbness  Psychiatric/Behavioral: Negative for worsening agitation or other fatigue All other system neg per pt      Objective:   Physical Exam BP 116/84   Pulse 74   Temp 97.9 F (36.6 C) (Oral)   Ht 5' 5.5" (1.664 m)   Wt (!) 305 lb (138.3 kg)   SpO2 98%   BMI 49.98 kg/m  VS noted, morbid obese Constitutional: Pt appears in NAD HENT: Head: NCAT.  Right Ear: External ear normal.  Left Ear: External ear normal.  Eyes: . Pupils are equal, round, and reactive to light. Conjunctivae and EOM are normal Nose: without d/c or deformity Neck: Neck supple. Gross normal ROM Cardiovascular: Normal rate and regular rhythm.   Pulmonary/Chest: Effort normal and breath sounds without rales or wheezing.  Neurological: Pt is  alert. At baseline orientation, motor grossly intact bilat hands with diffuse 2+ tender swelling without significant skin erythema Skin: Skin is warm. No rashes, other new lesions, no LE edema Psychiatric: Pt behavior is normal without agitation , + depressed, nervous mood No other exam findings  Aug 22 labs per [facility] with normal cbc, bmp, and TSH and glucose 114  Lab Results  Component Value Date   WBC 7.2 07/21/2016   HGB 14.4 07/21/2016   HCT 42.3 07/21/2016   PLT 260.0 07/21/2016   GLUCOSE 141 (H) 09/25/2016   CHOL 183 11/07/2015   TRIG 165 (H) 11/07/2015   HDL 52 11/07/2015   LDLCALC 98 11/07/2015   ALT 28 09/25/2016   AST 38 (H) 09/25/2016   NA 141 09/25/2016   K 4.2 09/25/2016   CL 105 09/25/2016   CREATININE 0.81 09/25/2016   BUN 13 09/25/2016   CO2 31 09/25/2016   INR 1.06 11/07/2015   HGBA1C 10.7 (H) 09/25/2016   MICROALBUR <0.7 01/15/2016       Assessment & Plan:

## 2016-12-25 NOTE — Assessment & Plan Note (Addendum)
Likely new onset, doubt RA but will check uric acid and RA labs; for depomedrol IM 80, predpac asd, cont pain control, f/u with PCP soon  Note:  Total time for pt hx, exam, review of record with pt in the room, determination of diagnoses and plan for further eval and tx is > 40 min, with over 50% spent in coordination and counseling of patient including the differential dx, tx, further evaluation and other management of new onset polyarthritis, depression, DM and chronic pain

## 2016-12-28 ENCOUNTER — Encounter: Payer: Self-pay | Admitting: Internal Medicine

## 2016-12-28 ENCOUNTER — Ambulatory Visit: Payer: Medicare Other | Admitting: Internal Medicine

## 2016-12-29 LAB — ANTI-NUCLEAR AB-TITER (ANA TITER): ANA Titer 1: 1:40 {titer} — ABNORMAL HIGH

## 2016-12-29 LAB — ANA: Anti Nuclear Antibody(ANA): POSITIVE — AB

## 2016-12-29 LAB — CYCLIC CITRUL PEPTIDE ANTIBODY, IGG: Cyclic Citrullin Peptide Ab: <16 UNITS

## 2016-12-31 ENCOUNTER — Telehealth: Payer: Self-pay | Admitting: Cardiology

## 2016-12-31 ENCOUNTER — Ambulatory Visit: Payer: Medicare Other | Admitting: Cardiology

## 2017-01-01 ENCOUNTER — Institutional Professional Consult (permissible substitution): Payer: Self-pay | Admitting: Internal Medicine

## 2017-01-04 ENCOUNTER — Other Ambulatory Visit: Payer: Self-pay | Admitting: Physician Assistant

## 2017-01-04 DIAGNOSIS — J449 Chronic obstructive pulmonary disease, unspecified: Secondary | ICD-10-CM | POA: Diagnosis not present

## 2017-01-04 DIAGNOSIS — I10 Essential (primary) hypertension: Secondary | ICD-10-CM

## 2017-01-07 NOTE — Progress Notes (Signed)
Subjective:    Patient ID: Tammy Strickland, female    DOB: 03/25/1961, 56 y.o.   MRN: [id number]  HPI The patient is here for follow up.  She saw Dr Jenny Reichmann two weeks ago for acute b/l hand and finger pain and swelling.  She woke up with the pain and it started two days ago.  No prior episodes.  No injury or trauma.  No other joints involved.  Diagnosed with gout.  She received a steroid injection and prednisone taper.  She had blood work - CCP negative.  ANA positive, but titer low.  Her fingers are locking on her and she is not able to close her fists.  The steroids helps loosen her hands up a little.  The prednisone helped her cough.    In the past few weeks her aunt died, her niece died and her twin sister tried to commit suicide.  She was started on lexapro 10 mg daily two weeks ago. She feels it has helped.    Diabetes:  She is taking her medication daily.  Her a1c two weeks ago was 7.3% down from 10.7%.  She is eating better, but not necessarily well balanced.  She is drinking only water.   She received her pain medication two weeks ago.  She was due for it.    Medications and allergies reviewed with patient and updated if appropriate.  Patient Active Problem List   Diagnosis Date Noted  . Acute gouty arthritis 12/25/2016  . Stroke ([place])   . Chronic fatigue 11/09/2016  . Hypoxia 11/09/2016  . Morbidly obese ([place]) 11/09/2016  . RLQ abdominal pain 07/21/2016  . Rash 07/21/2016  . Itching 07/21/2016  . Nonintractable headache 07/21/2016  . Allergic rhinitis 07/16/2016  . Vitamin D deficiency 04/16/2016  . Encounter for chronic pain management 03/12/2016  . Hyperlipidemia 01/15/2016  . Gait disorder 12/04/2015  . Diabetes ([place]) 11/26/2015  . Chronic diastolic congestive heart failure ([place]) 11/06/2015  . TIA (transient ischemic attack)   . 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 Chest pain 10/31/2015  . Insomnia 10/10/2015  . Depression 10/10/2015  . Benign essential HTN  08/15/2015  . Fibromyalgia 08/15/2015  . Peripheral neuropathy 08/15/2015  . CAD (coronary artery disease) 08/15/2015  . COPD (chronic obstructive pulmonary disease) ([place]) 08/15/2015  . Osteoporosis 08/15/2015  . 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 DDD (degenerative disc disease), lumbar 08/15/2015  . Occupational exposure to industrial toxins 08/15/2015  . GERD (gastroesophageal reflux disease) 08/15/2015    Current Outpatient Prescriptions on File Prior to Visit  Medication Sig Dispense Refill  . amitriptyline (ELAVIL) 25 MG tablet Take 2 tablets (50 mg total) by mouth at bedtime. 180 tablet 2  . aspirin EC 81 MG tablet Take 1 tablet (81 mg total) by mouth daily.    Marland Kitchen atorvastatin (LIPITOR) 20 MG tablet Take 1 tablet (20 mg total) by mouth daily. 90 tablet 2  . clonazePAM (KLONOPIN) 0.5 MG tablet Take 1 tablet (0.5 mg total) by mouth 2 (two) times daily as needed for anxiety. 20 tablet 0  . cloNIDine (CATAPRES) 0.3 MG tablet TAKE 1 TABLET BY MOUTH  DAILY 90 tablet 1  . Dulaglutide (TRULICITY) 1.5 ZD/6.6YQ SOPN Inject 1.5 mg into the skin once a week. 12 pen 1  . escitalopram (LEXAPRO) 10 MG tablet Take 1 tablet (10 mg total) by mouth daily. 90 tablet 3  . fluticasone (FLONASE) 50 MCG/ACT nasal spray Place 1 spray into both nostrils daily as needed for  allergies. 16 g 1  . furosemide (LASIX) 80 MG tablet Take 1 tablet (80 mg total) by mouth daily. 90 tablet 1  . gabapentin (NEURONTIN) 800 MG tablet TAKE 1 AND 1/2 TABLETS BY  MOUTH 3 TIMES DAILY 270 tablet 1  . glimepiride (AMARYL) 2 MG tablet Take 1 tablet (2 mg total) by mouth daily before breakfast. 90 tablet 1  . glucose blood test strip Use as instructed to check sugar three times a day 200 each 5  . Ipratropium-Albuterol (COMBIVENT) 20-100 MCG/ACT AERS respimat Inhale 1 puff into the lungs every 6 (six) hours. 3 Inhaler 1  . Lancets MISC Use as directed twice per day 200 each 11  . losartan (COZAAR) 50 MG tablet Take 1 tablet (50 mg total)  by mouth 2 (two) times daily. 180 tablet 1  . metFORMIN (GLUCOPHAGE-XR) 500 MG 24 hr tablet TAKE 2 TABLETS BY MOUTH 2  TIMES DAILY WITH A MEAL. 360 tablet 0  . metoprolol (TOPROL-XL) 200 MG 24 hr tablet Take 1 tablet (200 mg total) by mouth daily. 90 tablet 1  . naproxen (NAPROSYN) 375 MG tablet Take 1 tablet (375 mg total) by mouth 2 (two) times daily. 20 tablet 0  . nitroGLYCERIN (NITROSTAT) 0.4 MG SL tablet Place 0.4 mg under the tongue every 5 (five) minutes as needed for chest pain.    Marland Kitchen omeprazole (PRILOSEC) 20 MG capsule TAKE 1 CAPSULE BY MOUTH  DAILY 90 capsule 1  . oxyCODONE-acetaminophen (PERCOCET) 10-325 MG tablet Take 1 tablet by mouth every 8 (eight) hours as needed for pain. 90 tablet 0  . potassium chloride SA (K-DUR,KLOR-CON) 20 MEQ tablet TAKE 1 TABLET BY MOUTH  DAILY 90 tablet 1  . potassium chloride SA (K-DUR,KLOR-CON) 20 MEQ tablet TAKE 1 TABLET BY MOUTH DAILY 90 tablet 0  . predniSONE (DELTASONE) 10 MG tablet 3 tabs by mouth per day for 3 days,2tabs per day for 3 days,1tab per day for 3 days 18 tablet 0  . tiZANidine (ZANAFLEX) 4 MG tablet TAKE 2.5 TABLETS BY MOUTH 3 TIMES DAILY. 225 tablet 1  . traZODone (DESYREL) 50 MG tablet TAKE 1 TABLET(50 MG) BY MOUTH AT BEDTIME 90 tablet 0   No current facility-administered medications on file prior to visit.     Past Medical History:  Diagnosis Date  . Anxiety   . Arthritis   . CHF (congestive heart failure) ([place])   . COPD (chronic obstructive pulmonary disease) ([place])   . Coronary artery disease   . Depression   . Diabetes mellitus without complication ([place])   . GERD (gastroesophageal reflux disease)   . Hypertension   . Lupus   . Neuromuscular disorder ([place])   . Osteoporosis   . Peripheral neuropathy   . Stroke [facility])     Past Surgical History:  Procedure Laterality Date  . ABDOMINAL HYSTERECTOMY     ovaries left  . APPENDECTOMY    . CHOLECYSTECTOMY    . IR GENERIC HISTORICAL  11/07/2015   IR ANGIO INTRA EXTRACRAN  SEL COM CAROTID INNOMINATE BILAT MOD SED 11/07/2015 Luanne Bras, MD [facility]  . IR GENERIC HISTORICAL  11/07/2015   IR ANGIO VERTEBRAL SEL SUBCLAVIAN INNOMINATE UNI R MOD SED 11/07/2015 Luanne Bras, MD [facility]  . IR GENERIC HISTORICAL  11/07/2015   IR ANGIO VERTEBRAL SEL VERTEBRAL UNI L MOD SED 11/07/2015 Luanne Bras, MD [facility]  . IR GENERIC HISTORICAL  11/07/2015   IR ANGIOGRAM EXTREMITY LEFT 11/07/2015 Luanne Bras, MD [facility]  .  TONSILLECTOMY      Social History   Social History  . Marital status: Single    Spouse name: n/a  . Number of children: 3  . Years of education: 12+   Occupational History  . 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 Smoking status: Current Every Day Smoker    Packs/day: 0.25    Years: 35.00    Types: Cigarettes  . Smokeless tobacco: Never Used     Comment: referred  to smoking  cessation  classes. [facility]   . Alcohol use No  . 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   Marland Kitchen Sexual activity: Yes    Partners: Female   Other Topics Concern  . None   Social History Narrative   Moved to [place] from [place], [place] February 2017, to help her daughter.   Lives with her daughter.   Sons live in [place] and [place].   She reports that there were originally 17 children in her family (she is the youngest), the oldest are deceased, some prior to her birth, and she isn't sure which were female/female or how they died.    Family History  Problem Relation Age of Onset  . Hyperlipidemia Mother   . Hypertension Mother   . Hyperlipidemia Father   . Hypertension Father   . Stroke Father   . Hypertension Sister   . Cancer Sister        breast cancer  . Crohn's disease Sister     Review of Systems  Constitutional: Negative for chills and fever.  Respiratory: Positive for cough, shortness of breath and wheezing.     Cardiovascular: Positive for chest pain and leg swelling (mild). Negative for palpitations.  Musculoskeletal: Positive for arthralgias and back pain.  Neurological: Positive for light-headedness and headaches.       Objective:   Vitals:   01/08/17 0849  BP: 106/68  Pulse: 87  Resp: 18  Temp: 98.3 F (36.8 C)  SpO2: 95%   Wt Readings from Last 3 Encounters:  01/08/17 299 lb (135.6 kg)  12/25/16 (!) 305 lb (138.3 kg)  12/24/16 (!) 300 lb 11.2 oz (136.4 kg)   Body mass index is 49 kg/m.   Physical Exam    Constitutional: Appears well-developed and well-nourished. No distress.  HENT:  Head: Normocephalic and atraumatic.  Neck: Neck supple. No tracheal deviation present. No thyromegaly present.  No cervical lymphadenopathy Cardiovascular: Normal rate, regular rhythm and normal heart sounds.   No murmur heard. No carotid bruit .  No edema Pulmonary/Chest: Effort normal and breath sounds normal. No respiratory distress. No has no wheezes. No rales.  Skin: Skin is warm and dry. Not diaphoretic.  Psychiatric: Normal mood and affect. Behavior is normal.      Assessment & Plan:    See Problem List for Assessment and Plan of chronic medical problems.

## 2017-01-08 ENCOUNTER — Encounter: Payer: Self-pay | Admitting: Internal Medicine

## 2017-01-08 ENCOUNTER — Ambulatory Visit (INDEPENDENT_AMBULATORY_CARE_PROVIDER_SITE_OTHER): Payer: Medicare Other | Admitting: Internal Medicine

## 2017-01-08 VITALS — BP 106/68 | HR 87 | Temp 98.3°F | Resp 18 | Wt 299.0 lb

## 2017-01-08 DIAGNOSIS — M109 Gout, unspecified: Secondary | ICD-10-CM | POA: Diagnosis not present

## 2017-01-08 DIAGNOSIS — F329 Major depressive disorder, single episode, unspecified: Secondary | ICD-10-CM

## 2017-01-08 DIAGNOSIS — E119 Type 2 diabetes mellitus without complications: Secondary | ICD-10-CM | POA: Diagnosis not present

## 2017-01-08 DIAGNOSIS — F32A Depression, unspecified: Secondary | ICD-10-CM

## 2017-01-08 NOTE — Assessment & Plan Note (Signed)
Symptoms improved, but still has some hand symptoms Will see ortho - has chronic pain and stiffness

## 2017-01-08 NOTE — Assessment & Plan Note (Signed)
Lab Results  Component Value Date   HGBA1C 7.3 (H) 12/25/2016   Much improved Continue current diet and medications

## 2017-01-08 NOTE — Patient Instructions (Signed)
  Test(s) ordered today. Your results will be released to [place] (or called to you) after review, usually within 72hours after test completion. If any changes need to be made, you will be notified at that same time.  All other Health Maintenance issues reviewed.   All recommended immunizations and age-appropriate screenings are up-to-date or discussed.  No immunizations administered today.   Medications reviewed and updated.  No changes recommended at this time.  Your prescription(s) have been submitted to your pharmacy. Please take as directed and contact our office if you believe you are having problem(s) with the medication(s).   Please followup in 2 weeks

## 2017-01-08 NOTE — Assessment & Plan Note (Signed)
Improved with lexapro Continue lexapro 10 mg daily

## 2017-01-14 ENCOUNTER — Telehealth: Payer: Self-pay | Admitting: Internal Medicine

## 2017-01-14 DIAGNOSIS — M47816 Spondylosis without myelopathy or radiculopathy, lumbar region: Secondary | ICD-10-CM | POA: Diagnosis not present

## 2017-01-14 NOTE — Telephone Encounter (Signed)
Pt called asking if you could call her when you have a chance.

## 2017-01-15 NOTE — Telephone Encounter (Signed)
Spoke with pt on 10/4 in regards to paperwork, given to MD for Signature and completion.

## 2017-01-20 ENCOUNTER — Other Ambulatory Visit: Payer: Self-pay | Admitting: Internal Medicine

## 2017-01-21 ENCOUNTER — Ambulatory Visit (INDEPENDENT_AMBULATORY_CARE_PROVIDER_SITE_OTHER): Payer: Medicare Other | Admitting: Podiatry

## 2017-01-21 ENCOUNTER — Encounter: Payer: Self-pay | Admitting: Podiatry

## 2017-01-21 ENCOUNTER — Ambulatory Visit: Payer: Self-pay | Admitting: Skilled Nursing Facility1

## 2017-01-21 DIAGNOSIS — E1149 Type 2 diabetes mellitus with other diabetic neurological complication: Secondary | ICD-10-CM | POA: Diagnosis not present

## 2017-01-21 DIAGNOSIS — B351 Tinea unguium: Secondary | ICD-10-CM

## 2017-01-21 DIAGNOSIS — M79675 Pain in left toe(s): Secondary | ICD-10-CM | POA: Diagnosis not present

## 2017-01-21 DIAGNOSIS — M79674 Pain in right toe(s): Secondary | ICD-10-CM

## 2017-01-21 NOTE — Progress Notes (Signed)
Subjective: 56 y.o. returns the office today for painful, elongated, thickened toenails which she cannot trim herself. Denies any redness or drainage around the nails. Denies any acute changes since last appointment and no new complaints today. Denies any systemic complaints such as fevers, chills, nausea, vomiting.   Objective: AAO 3, NAD DP/PT pulses palpable, CRT less than 3 seconds Protective sensation decreased with Simms Weinstein monofilament Nails hypertrophic, dystrophic, elongated, brittle, discolored 10. There is tenderness overlying the nails 1-5 bilaterally. There is incurvation of both corners of bilateral hallux toenails. There is no surrounding erythema or drainage along the nail sites. No open lesions or pre-ulcerative lesions are identified. Dry skin is present No other areas of tenderness bilateral lower extremities. No overlying edema, erythema, increased warmth. No pain with calf compression, swelling, warmth, erythema.  Assessment: Patient presents with symptomatic onychomycosis; neuropathy   Plan: -Treatment options including alternatives, risks, complications were discussed -Nails sharply debrided 10 without complication. Very small amount of bleeding occurred during removal of part of the nail on the medial corner were significantly incurvated. There is no pus. Interrupted ointment was applied followed by a bandage and she should do this as well. He'll next couple weeks to call the office. -Moisturizer to the feet daily discussed. Do not apply interdigitally -Discussed daily foot inspection. If there are any changes, to call the office immediately.  -Follow-up in 3 months or sooner if any problems are to arise. In the meantime, encouraged to call the office with any questions, concerns, changes symptoms.  Celesta Gentile, DPM

## 2017-01-21 NOTE — Progress Notes (Signed)
Subjective:    Patient ID: Tammy Strickland, female    DOB: [birth year], 56 y.o.   MRN: [id number]  HPI The patient is here for follow up for chronic pain management.  Indication for chronic opioid: severe knee OA, chronic lower back pain Medication and dose: percocet 10-325 mg 1 tab three times a day # pills per month: 90 Last UDS date: 01/22/17 Pain contract signed (Y/N):07/21/2016 Date narcotic database last reviewed (include red flags): 12/25/16 last fill date for 30 days, no red flags   Pain assessment:  Pain intensity: 8 /10 now Amount of pain relief with medication: yes pain med helps about 80% Use of pain medications:   Taking the medication 3/ day Side effects:   none Sleep: falls asleep ok, gets 5 hrs of sleep Mood: depression at times Functional/social activities: continues to be active in home/work setting     Medications and allergies reviewed with patient and updated if appropriate.  Patient Active Problem List   Diagnosis Date Noted  . Acute gouty arthritis 12/25/2016  . Stroke ([place])   . Chronic fatigue 11/09/2016  . Hypoxia 11/09/2016  . Morbidly obese ([place]) 11/09/2016  . RLQ abdominal pain 07/21/2016  . Rash 07/21/2016  . Itching 07/21/2016  . Nonintractable headache 07/21/2016  . Allergic rhinitis 07/16/2016  . Vitamin D deficiency 04/16/2016  . Encounter for chronic pain management 03/12/2016  . Hyperlipidemia 01/15/2016  . Gait disorder 12/04/2015  . Diabetes ([place]) 11/26/2015  . Chronic diastolic congestive heart failure ([place]) 11/06/2015  . TIA (transient ischemic attack)   . Carotid-cavernous fistula   . Chest pain 10/31/2015  . Insomnia 10/10/2015  . Depression 10/10/2015  . Benign essential HTN 08/15/2015  . Fibromyalgia 08/15/2015  . Peripheral neuropathy 08/15/2015  . CAD (coronary artery disease) 08/15/2015  . COPD (chronic obstructive pulmonary disease) ([place]) 08/15/2015  . Osteoporosis 08/15/2015  . 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 DDD  (degenerative disc disease), lumbar 08/15/2015  . Occupational exposure to industrial toxins 08/15/2015  . GERD (gastroesophageal reflux disease) 08/15/2015    Current Outpatient Prescriptions on File Prior to Visit  Medication Sig Dispense Refill  . amitriptyline (ELAVIL) 25 MG tablet TAKE 2 TABLETS(50 MG) BY MOUTH AT BEDTIME 180 tablet 1  . aspirin EC 81 MG tablet Take 1 tablet (81 mg total) by mouth daily.    Marland Kitchen atorvastatin (LIPITOR) 20 MG tablet Take 1 tablet (20 mg total) by mouth daily. 90 tablet 2  . atorvastatin (LIPITOR) 20 MG tablet TAKE 1 TABLET(20 MG) BY MOUTH DAILY 90 tablet 1  . clonazePAM (KLONOPIN) 0.5 MG tablet Take 1 tablet (0.5 mg total) by mouth 2 (two) times daily as needed for anxiety. 20 tablet 0  . cloNIDine (CATAPRES) 0.3 MG tablet TAKE 1 TABLET BY MOUTH  DAILY 90 tablet 1  . Dulaglutide (TRULICITY) 1.5 DE/0.8XK SOPN Inject 1.5 mg into the skin once a week. 12 pen 1  . escitalopram (LEXAPRO) 10 MG tablet Take 1 tablet (10 mg total) by mouth daily. 90 tablet 3  . fluticasone (FLONASE) 50 MCG/ACT nasal spray Place 1 spray into both nostrils daily as needed for allergies. 16 g 1  . furosemide (LASIX) 80 MG tablet Take 1 tablet (80 mg total) by mouth daily. 90 tablet 1  . gabapentin (NEURONTIN) 800 MG tablet TAKE 1 AND 1/2 TABLETS BY  MOUTH 3 TIMES DAILY 270 tablet 1  . glimepiride (AMARYL) 2 MG tablet Take 1 tablet (2 mg total) by mouth daily before breakfast.  90 tablet 1  . glucose blood test strip Use as instructed to check sugar three times a day 200 each 5  . Ipratropium-Albuterol (COMBIVENT) 20-100 MCG/ACT AERS respimat Inhale 1 puff into the lungs every 6 (six) hours. 3 Inhaler 1  . Lancets MISC Use as directed twice per day 200 each 11  . losartan (COZAAR) 50 MG tablet Take 1 tablet (50 mg total) by mouth 2 (two) times daily. 180 tablet 1  . metFORMIN (GLUCOPHAGE-XR) 500 MG 24 hr tablet TAKE 2 TABLETS BY MOUTH 2  TIMES DAILY WITH A MEAL. 360 tablet 0  . metoprolol  (TOPROL-XL) 200 MG 24 hr tablet Take 1 tablet (200 mg total) by mouth daily. 90 tablet 1  . naproxen (NAPROSYN) 375 MG tablet Take 1 tablet (375 mg total) by mouth 2 (two) times daily. 20 tablet 0  . nitroGLYCERIN (NITROSTAT) 0.4 MG SL tablet Place 0.4 mg under the tongue every 5 (five) minutes as needed for chest pain.    Marland Kitchen omeprazole (PRILOSEC) 20 MG capsule TAKE 1 CAPSULE BY MOUTH  DAILY 90 capsule 1  . oxyCODONE-acetaminophen (PERCOCET) 10-325 MG tablet Take 1 tablet by mouth every 8 (eight) hours as needed for pain. 90 tablet 0  . potassium chloride SA (K-DUR,KLOR-CON) 20 MEQ tablet TAKE 1 TABLET BY MOUTH DAILY 90 tablet 0  . predniSONE (DELTASONE) 10 MG tablet 3 tabs by mouth per day for 3 days,2tabs per day for 3 days,1tab per day for 3 days 18 tablet 0  . tiZANidine (ZANAFLEX) 4 MG tablet TAKE 2.5 TABLETS BY MOUTH 3 TIMES DAILY. 225 tablet 1  . traZODone (DESYREL) 50 MG tablet TAKE 1 TABLET(50 MG) BY MOUTH AT BEDTIME 90 tablet 0   No current facility-administered medications on file prior to visit.     Past Medical History:  Diagnosis Date  . Anxiety   . Arthritis   . CHF (congestive heart failure) ([place])   . COPD (chronic obstructive pulmonary disease) ([place])   . Coronary artery disease   . Depression   . Diabetes mellitus without complication ([place])   . GERD (gastroesophageal reflux disease)   . Hypertension   . Lupus   . Neuromuscular disorder ([place])   . Osteoporosis   . Peripheral neuropathy   . Stroke [facility])     Past Surgical History:  Procedure Laterality Date  . ABDOMINAL HYSTERECTOMY     ovaries left  . APPENDECTOMY    . CHOLECYSTECTOMY    . IR GENERIC HISTORICAL  11/07/2015   IR ANGIO INTRA EXTRACRAN SEL COM CAROTID INNOMINATE BILAT MOD SED 11/07/2015 Luanne Bras, MD [facility]  . IR GENERIC HISTORICAL  11/07/2015   IR ANGIO VERTEBRAL SEL SUBCLAVIAN INNOMINATE UNI R MOD SED 11/07/2015 Luanne Bras, MD [facility]  . IR GENERIC HISTORICAL  11/07/2015    IR ANGIO VERTEBRAL SEL VERTEBRAL UNI L MOD SED 11/07/2015 Luanne Bras, MD [facility]  . IR GENERIC HISTORICAL  11/07/2015   IR ANGIOGRAM EXTREMITY LEFT 11/07/2015 Luanne Bras, MD [facility]  . TONSILLECTOMY      Social History   Social History  . Marital status: Single    Spouse name: n/a  . Number of children: 3  . Years of education: 12+   Occupational History  . 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 Smoking status: Current Every Day Smoker    Packs/day: 0.25    Years: 35.00  Types: Cigarettes  . Smokeless tobacco: Never Used     Comment: referred  to smoking  cessation  classes. at  [facility]   . Alcohol use No  . 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   Marland Kitchen Sexual activity: Yes    Partners: Female   Other Topics Concern  . None   Social History Narrative   Moved to [place] from [place], [place] February 2017, to help her daughter.   Lives with her daughter.   Sons live in [place] and [place].   She reports that there were originally 17 children in her family (she is the youngest), the oldest are deceased, some prior to her birth, and she isn't sure which were female/female or how they died.    Family History  Problem Relation Age of Onset  . Hyperlipidemia Mother   . Hypertension Mother   . Hyperlipidemia Father   . Hypertension Father   . Stroke Father   . Hypertension Sister   . Cancer Sister        breast cancer  . Crohn's disease Sister     Review of Systems  Constitutional: Negative for chills and fever.  Gastrointestinal: Negative for constipation.  Musculoskeletal: Positive for arthralgias, back pain, gait problem and myalgias.  Neurological: Positive for numbness (left leg numb/tingling).       Objective:   Vitals:   01/22/17 0805  BP: 100/70  Pulse: (!) 58  Temp: 97.8 F (36.6 C)  SpO2: 98%   Wt Readings from Last 3 Encounters:    01/22/17 (!) 305 lb (138.3 kg)  01/08/17 299 lb (135.6 kg)  12/25/16 (!) 305 lb (138.3 kg)   Body mass index is 49.98 kg/m.   Physical Exam    Constitutional: Appears well-developed and well-nourished. No distress.  HENT:  Head: Normocephalic and atraumatic.  Neck: Neck supple. No tracheal deviation present. No thyromegaly present.  No cervical lymphadenopathy Cardiovascular: Normal rate, regular rhythm and normal heart sounds.   No murmur heard. No carotid bruit .  No edema Pulmonary/Chest: Effort normal and breath sounds normal. No respiratory distress. No has no wheezes. No rales.  Msk:  No lumbar back pain with palpation, no SI joint pain with palpation Skin: Skin is warm and dry. Not diaphoretic.  Psychiatric: Normal mood and affect. Behavior is normal.      Assessment & Plan:    See Problem List for Assessment and Plan of chronic medical problems.

## 2017-01-21 NOTE — Telephone Encounter (Signed)
Paperwork completed and faxed.  °

## 2017-01-22 ENCOUNTER — Other Ambulatory Visit: Payer: Medicare Other

## 2017-01-22 ENCOUNTER — Ambulatory Visit (INDEPENDENT_AMBULATORY_CARE_PROVIDER_SITE_OTHER): Payer: Medicare Other | Admitting: Internal Medicine

## 2017-01-22 ENCOUNTER — Encounter: Payer: Self-pay | Admitting: Internal Medicine

## 2017-01-22 VITALS — BP 100/70 | HR 58 | Temp 97.8°F | Ht 65.5 in | Wt 305.0 lb

## 2017-01-22 DIAGNOSIS — G8929 Other chronic pain: Secondary | ICD-10-CM | POA: Diagnosis not present

## 2017-01-22 DIAGNOSIS — M5136 Other intervertebral disc degeneration, lumbar region: Secondary | ICD-10-CM | POA: Diagnosis not present

## 2017-01-22 DIAGNOSIS — M1712 Unilateral primary osteoarthritis, left knee: Secondary | ICD-10-CM

## 2017-01-22 MED ORDER — OXYCODONE-ACETAMINOPHEN 10-325 MG PO TABS: 1.0000 | ORAL_TABLET | Freq: Three times a day (TID) | ORAL | 0 refills | Status: DC | PRN

## 2017-01-22 MED ORDER — GABAPENTIN 600 MG PO TABS: 1200.0000 mg | ORAL_TABLET | Freq: Three times a day (TID) | ORAL | 1 refills | Status: DC

## 2017-01-22 NOTE — Assessment & Plan Note (Signed)
Chronic pain Following with ortho - get injections Pain management Has been referred to pain management

## 2017-01-22 NOTE — Patient Instructions (Addendum)
Give a urine sample today.    Medications reviewed and updated.   No changes recommended at this time.  Your prescription(s) have been given to you.  Take all three to your pharmacy and have them hold them.   Please followup in 3 months for chronic pain management

## 2017-01-22 NOTE — Assessment & Plan Note (Signed)
Indication for chronic opioid: severe knee OA Medication and dose: percocet 10-325 mg 1 tab three times a day # pills per month: 90 Last UDS date: 01/22/2017 Pain contract signed (Y/N): 07/21/16 Date narcotic database last reviewed (include red flags): checked 01/22/17, no red flags  Pain is controlled and tolerable.  She has been referred to pain management and I did not know that she had an appt next month -- I did provide three prescriptions that can not be filled early - ideally pain management should be taken over by pain clinic F/u in 3 months

## 2017-01-22 NOTE — Assessment & Plan Note (Signed)
Severe pain Uses cane Following with ortho Wants TKR but needs to lose weight first and is struggling with weight loss Pain management

## 2017-01-27 ENCOUNTER — Encounter: Payer: Self-pay | Admitting: Internal Medicine

## 2017-01-27 LAB — PAIN MGMT, PROFILE 8 W/CONF, U
6 Acetylmorphine: NEGATIVE ng/mL (ref <10)
Alcohol Metabolites: NEGATIVE ng/mL (ref <500)
Alphahydroxyalprazolam: NEGATIVE ng/mL (ref <25)
Alphahydroxymidazolam: NEGATIVE ng/mL (ref <50)
Alphahydroxytriazolam: NEGATIVE ng/mL (ref <50)
Aminoclonazepam: NEGATIVE ng/mL (ref <25)
Amphetamines: NEGATIVE ng/mL (ref <500)
Benzodiazepines: NEGATIVE ng/mL (ref <100)
Buprenorphine, Urine: NEGATIVE ng/mL (ref <5)
Cocaine Metabolite: NEGATIVE ng/mL (ref <150)
Creatinine: 17.6 mg/dL — ABNORMAL LOW
Hydroxyethylflurazepam: NEGATIVE ng/mL (ref <50)
Lorazepam: NEGATIVE ng/mL (ref <50)
MDMA: NEGATIVE ng/mL (ref <500)
Marijuana Metabolite: NEGATIVE ng/mL (ref <20)
Nordiazepam: NEGATIVE ng/mL (ref <50)
Opiates: NEGATIVE ng/mL (ref <100)
Oxazepam: NEGATIVE ng/mL (ref <50)
Oxidant: NEGATIVE ug/mL (ref <200)
Oxycodone: NEGATIVE ng/mL (ref <100)
Specific Gravity: 1.005 (ref >=1.0)
Temazepam: NEGATIVE ng/mL (ref <50)
pH: 5.56 (ref 4.5–9.0)

## 2017-01-28 ENCOUNTER — Other Ambulatory Visit: Payer: Self-pay | Admitting: Emergency Medicine

## 2017-01-28 DIAGNOSIS — J42 Unspecified chronic bronchitis: Secondary | ICD-10-CM

## 2017-01-28 DIAGNOSIS — M47816 Spondylosis without myelopathy or radiculopathy, lumbar region: Secondary | ICD-10-CM | POA: Diagnosis not present

## 2017-01-28 DIAGNOSIS — I10 Essential (primary) hypertension: Secondary | ICD-10-CM

## 2017-01-28 DIAGNOSIS — M5136 Other intervertebral disc degeneration, lumbar region: Secondary | ICD-10-CM

## 2017-01-28 MED ORDER — FUROSEMIDE 80 MG PO TABS: 80.0000 mg | ORAL_TABLET | Freq: Every day | ORAL | 1 refills | Status: DC

## 2017-01-28 MED ORDER — IPRATROPIUM-ALBUTEROL 20-100 MCG/ACT IN AERS: 1.0000 | INHALATION_SPRAY | Freq: Four times a day (QID) | RESPIRATORY_TRACT | 1 refills | Status: DC

## 2017-01-28 MED ORDER — GLUCOSE BLOOD VI STRP: ORAL_STRIP | 5 refills | Status: DC

## 2017-01-28 MED ORDER — OMEPRAZOLE 20 MG PO CPDR: 20.0000 mg | DELAYED_RELEASE_CAPSULE | Freq: Every day | ORAL | 1 refills | Status: DC

## 2017-01-28 MED ORDER — TRAZODONE HCL 50 MG PO TABS: ORAL_TABLET | ORAL | 0 refills | Status: DC

## 2017-01-28 MED ORDER — POTASSIUM CHLORIDE CRYS ER 20 MEQ PO TBCR: 20.0000 meq | EXTENDED_RELEASE_TABLET | Freq: Every day | ORAL | 1 refills | Status: DC

## 2017-01-28 MED ORDER — TIZANIDINE HCL 4 MG PO TABS: ORAL_TABLET | ORAL | 1 refills | Status: DC

## 2017-01-28 MED ORDER — METOPROLOL SUCCINATE ER 200 MG PO TB24: 200.0000 mg | ORAL_TABLET | Freq: Every day | ORAL | 1 refills | Status: DC

## 2017-01-28 MED ORDER — LANCETS MISC: 5 refills | Status: DC

## 2017-02-01 ENCOUNTER — Other Ambulatory Visit: Payer: Self-pay | Admitting: Urgent Care

## 2017-02-01 DIAGNOSIS — I10 Essential (primary) hypertension: Secondary | ICD-10-CM

## 2017-02-02 ENCOUNTER — Other Ambulatory Visit: Payer: Self-pay | Admitting: Emergency Medicine

## 2017-02-02 DIAGNOSIS — I10 Essential (primary) hypertension: Secondary | ICD-10-CM

## 2017-02-02 MED ORDER — CLONIDINE HCL 0.3 MG PO TABS: 0.3000 mg | ORAL_TABLET | Freq: Every day | ORAL | 1 refills | Status: DC

## 2017-02-03 ENCOUNTER — Ambulatory Visit: Payer: Medicare Other | Admitting: Cardiology

## 2017-02-03 DIAGNOSIS — J449 Chronic obstructive pulmonary disease, unspecified: Secondary | ICD-10-CM | POA: Diagnosis not present

## 2017-02-03 MED ORDER — METFORMIN HCL ER 500 MG PO TB24: ORAL_TABLET | ORAL | 1 refills | Status: DC

## 2017-02-03 NOTE — Addendum Note (Signed)
Addended by: Terence Lux B on: 02/03/2017 03:52 PM   Modules accepted: Orders

## 2017-02-04 ENCOUNTER — Encounter: Payer: Self-pay | Admitting: Gastroenterology

## 2017-02-08 DIAGNOSIS — G894 Chronic pain syndrome: Secondary | ICD-10-CM | POA: Diagnosis not present

## 2017-02-08 DIAGNOSIS — M545 Low back pain: Secondary | ICD-10-CM | POA: Diagnosis not present

## 2017-02-08 DIAGNOSIS — M79604 Pain in right leg: Secondary | ICD-10-CM | POA: Diagnosis not present

## 2017-02-08 DIAGNOSIS — M25559 Pain in unspecified hip: Secondary | ICD-10-CM | POA: Diagnosis not present

## 2017-02-08 DIAGNOSIS — M79605 Pain in left leg: Secondary | ICD-10-CM | POA: Diagnosis not present

## 2017-02-10 ENCOUNTER — Ambulatory Visit (INDEPENDENT_AMBULATORY_CARE_PROVIDER_SITE_OTHER): Payer: Medicare Other | Admitting: Orthopaedic Surgery

## 2017-02-10 ENCOUNTER — Ambulatory Visit (INDEPENDENT_AMBULATORY_CARE_PROVIDER_SITE_OTHER): Payer: Medicare Other

## 2017-02-10 DIAGNOSIS — M25562 Pain in left knee: Secondary | ICD-10-CM

## 2017-02-10 DIAGNOSIS — M1712 Unilateral primary osteoarthritis, left knee: Secondary | ICD-10-CM

## 2017-02-10 DIAGNOSIS — G8929 Other chronic pain: Secondary | ICD-10-CM

## 2017-02-10 NOTE — Progress Notes (Signed)
Office Visit Note   Patient: Tammy Strickland           Date of Birth: Mar 22, 1961           MRN: [id number] Visit Date: 02/10/2017              Requested by: Binnie Rail, MD [place], [place] [id number] PCP: Binnie Rail, MD   Assessment & Plan: Visit Diagnoses:  1. Chronic pain of left knee   2. Unilateral primary osteoarthritis, left knee    The patient does have severe end-stage arthritis of her right knee.  Although she is morbidly obese Plan: Her hemoglobin A1c is under good control Radius stent.  I do feel that we can successfully perform this knee replacement surgery in light of her body mass index.  She has tried and failed all other forms of conservative treatment.  Her x-rays show severe arthritis and I do feel that this would decrease her pain and improve her quality of life and her mobility overall and this could help with her health overall.  We had a long thorough discussion about knee replacement surgery explained in detail the risk and benefits of surgery and how obesity can affect all these risks as well.  I showed her knee model explained in detail with an operative blood sounds are coarse of the.  All questions and concerns were answered and addressed.  We will see about seeing her surgery scheduled in the near future.  Follow-Up Instructions: Return for 2 weeks post-op.   Orders:  Orders Placed This Encounter  Procedures  . XR Knee 1-2 Views Left   No orders of the defined types were placed in this encounter.     Procedures: No procedures performed   Clinical Data: No additional findings.   Subjective: No chief complaint on file. The patient is some I am seeing for the first time.  She is a 56 year old female with diabetes and morbid obesity with severe well-documented osteoarthritis and degenerative joint disease of her left knee.  She said the other orthopedic surgeon in town told her she needs a knee replacement but told her to keep trying  to lose weight.  However she is very thin appearing legs.  She does have congestive heart failure as well.  All his medical issues are stable.  She does use oxygen at night.  She said her last hemoglobin A1c was only 7.  The pain is daily and is debilitating.  It is 10 out of 10.  She embolus with a cane as well.  She is work on activity modification and weight loss.  This is now detrimentally affect directed daily living, quality of life, and mobility.   HPI  Review of Systems she denies any headache, chest pain, shortness of breath, fever, chills, nausea, vomiting.  She is alert and oriented x3 and in no acute distress  Objective: Vital Signs: There were no vitals taken for this visit.  Physical Exam  Ortho Exam Examination of her left knee she is a thin appearing knee.  Her range of motion is full but she has varus malalignment.  There is significant patellofemoral crepitation severe medial joint line tenderness.  The knee feels loose and stable. Specialty Comments:  No specialty comments available.  Imaging: Xr Knee 1-2 Views Left  Result Date: 02/10/2017 2 views of the left knee show severe tricompartmental arthritic changes.  There is varus malalignment and significant medial joint space narrowing.  PMFS History: Patient Active Problem List   Diagnosis Date Noted  . Acute gouty arthritis 12/25/2016  . Stroke ([place])   . Chronic fatigue 11/09/2016  . Hypoxia 11/09/2016  . Morbidly obese ([place]) 11/09/2016  . RLQ abdominal pain 07/21/2016  . Rash 07/21/2016  . Itching 07/21/2016  . Nonintractable headache 07/21/2016  . Allergic rhinitis 07/16/2016  . Vitamin D deficiency 04/16/2016  . Encounter for chronic pain management 03/12/2016  . Hyperlipidemia 01/15/2016  . Gait disorder 12/04/2015  . Diabetes ([place]) 11/26/2015  . Chronic diastolic congestive heart failure ([place]) 11/06/2015  . TIA (transient ischemic attack)   . Carotid-cavernous fistula   . Chest pain 10/31/2015    . Insomnia 10/10/2015  . Depression 10/10/2015  . Benign essential HTN 08/15/2015  . Fibromyalgia 08/15/2015  . Peripheral neuropathy 08/15/2015  . CAD (coronary artery disease) 08/15/2015  . COPD (chronic obstructive pulmonary disease) ([place]) 08/15/2015  . Osteoporosis 08/15/2015  . 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 DDD (degenerative disc disease), lumbar 08/15/2015  . Occupational exposure to industrial toxins 08/15/2015  . GERD (gastroesophageal reflux disease) 08/15/2015   Past Medical History:  Diagnosis Date  . Anxiety   . Arthritis   . CHF (congestive heart failure) ([place])   . COPD (chronic obstructive pulmonary disease) ([place])   . Coronary artery disease   . Depression   . Diabetes mellitus without complication ([place])   . GERD (gastroesophageal reflux disease)   . Hypertension   . Lupus   . Neuromuscular disorder ([place])   . Osteoporosis   . Peripheral neuropathy   . Stroke [facility])     Family History  Problem Relation Age of Onset  . Hyperlipidemia Mother   . Hypertension Mother   . Hyperlipidemia Father   . Hypertension Father   . Stroke Father   . Hypertension Sister   . Cancer Sister        breast cancer  . Crohn's disease Sister     Past Surgical History:  Procedure Laterality Date  . ABDOMINAL HYSTERECTOMY     ovaries left  . APPENDECTOMY    . CHOLECYSTECTOMY    . IR GENERIC HISTORICAL  11/07/2015   IR ANGIO INTRA EXTRACRAN SEL COM CAROTID INNOMINATE BILAT MOD SED 11/07/2015 Luanne Bras, MD [facility]  . IR GENERIC HISTORICAL  11/07/2015   IR ANGIO VERTEBRAL SEL SUBCLAVIAN INNOMINATE UNI R MOD SED 11/07/2015 Luanne Bras, MD [facility]  . IR GENERIC HISTORICAL  11/07/2015   IR ANGIO VERTEBRAL SEL VERTEBRAL UNI L MOD SED 11/07/2015 Luanne Bras, MD [facility]  . IR GENERIC HISTORICAL  11/07/2015   IR ANGIOGRAM EXTREMITY LEFT 11/07/2015 Luanne Bras, MD [facility]  . TONSILLECTOMY     Social History   Occupational History  .  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 Smoking status: Current Every Day Smoker    Packs/day: 0.25    Years: 35.00    Types: Cigarettes  . Smokeless tobacco: Never Used     Comment: referred  to smoking  cessation  classes. at  [facility]   . Alcohol use No  . 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   Marland Kitchen Sexual activity: Yes    Partners: Female

## 2017-02-11 ENCOUNTER — Ambulatory Visit: Payer: Medicare Other | Admitting: *Deleted

## 2017-02-11 VITALS — Ht 67.0 in | Wt 302.0 lb

## 2017-02-11 DIAGNOSIS — Z1211 Encounter for screening for malignant neoplasm of colon: Secondary | ICD-10-CM

## 2017-02-11 NOTE — Progress Notes (Signed)
Pt came in for PV today- pt states she is on 02 at night and she has COPD and lots of issues with her breathing. She does still smoke. Scheduled pt an OV with Darrell Jewel PA for 02-19-17 at 315 pm. Informed pt due to her 02 she will need to have her colon at the hospital and this will be Rescheduled after her OV. Pt verbalized understanding . Cancelled [facility] colon 11-15  Marie PV

## 2017-02-15 ENCOUNTER — Other Ambulatory Visit: Payer: Self-pay

## 2017-02-15 ENCOUNTER — Other Ambulatory Visit: Payer: Self-pay | Admitting: Internal Medicine

## 2017-02-15 ENCOUNTER — Emergency Department (HOSPITAL_COMMUNITY)
Admission: EM | Admit: 2017-02-15 | Discharge: 2017-02-15 | Disposition: A | Discharge status: Home / Self Care | Payer: Medicare Other | Attending: Emergency Medicine | Admitting: Emergency Medicine

## 2017-02-15 ENCOUNTER — Encounter (HOSPITAL_COMMUNITY): Payer: Self-pay | Admitting: Emergency Medicine

## 2017-02-15 DIAGNOSIS — R05 Cough: Secondary | ICD-10-CM | POA: Diagnosis not present

## 2017-02-15 DIAGNOSIS — R21 Rash and other nonspecific skin eruption: Secondary | ICD-10-CM | POA: Insufficient documentation

## 2017-02-15 DIAGNOSIS — Z5321 Procedure and treatment not carried out due to patient leaving prior to being seen by health care provider: Secondary | ICD-10-CM | POA: Diagnosis not present

## 2017-02-15 NOTE — ED Triage Notes (Signed)
HAD A SMALL BUMP ON HER LEFT BREAST AND  SHE " BUSTED IT "ON SAT AND NOW IT HURTS BAD  AND SHE HAS A COUGH SHE WANTS TO BE SEEN FOR

## 2017-02-17 ENCOUNTER — Other Ambulatory Visit: Payer: Self-pay | Admitting: Internal Medicine

## 2017-02-17 DIAGNOSIS — M5136 Other intervertebral disc degeneration, lumbar region: Secondary | ICD-10-CM

## 2017-02-17 NOTE — Patient Outreach (Signed)
Outreach patient after ED visit on 02/15/17.  I spoke with patient and verified PCP is current.  She has scheduled a follow appointment with PCP.  I explained [place] services and 24 Hour Nurse Advice Line.  I asked if she would like a follow up call from one of my team members if so I have a a few questions.  She no just send the information I the mail.  Successful letter, magnet and know before you go will be mailed 02/17/17.

## 2017-02-19 ENCOUNTER — Ambulatory Visit: Payer: Self-pay | Admitting: Physician Assistant

## 2017-02-23 ENCOUNTER — Encounter: Payer: Self-pay | Admitting: Pulmonary Disease

## 2017-02-23 ENCOUNTER — Ambulatory Visit (INDEPENDENT_AMBULATORY_CARE_PROVIDER_SITE_OTHER): Payer: Medicare Other | Admitting: Pulmonary Disease

## 2017-02-23 ENCOUNTER — Telehealth: Payer: Self-pay | Admitting: Internal Medicine

## 2017-02-23 VITALS — BP 126/82 | HR 68 | Ht 67.0 in | Wt 298.0 lb

## 2017-02-23 DIAGNOSIS — I10 Essential (primary) hypertension: Secondary | ICD-10-CM

## 2017-02-23 DIAGNOSIS — J449 Chronic obstructive pulmonary disease, unspecified: Secondary | ICD-10-CM

## 2017-02-23 DIAGNOSIS — G4733 Obstructive sleep apnea (adult) (pediatric): Secondary | ICD-10-CM

## 2017-02-23 NOTE — Telephone Encounter (Signed)
Pt came by the office asking to see Dr Quay Burow. She said that she thinks she has an infection and would like a prescription for Amoxicillin and Bactrum. There were no openings today. I offered for her to see Dr Quay Burow tomorrow but she is not able to come in due to other appointments. She said, Dr Quay Burow will just send it in for me, she knows what I have going on... She also wanted to let Dr Quay Burow know that she is scheduled for knee replacement surgery on 11/30 and wants to make sure that any infection she has is cleared up.

## 2017-02-23 NOTE — Patient Instructions (Signed)
Schedule sleep study

## 2017-02-23 NOTE — Progress Notes (Signed)
Subjective:    Patient ID: Tammy Strickland, female    DOB: [birth year], 56 y.o.   MRN: [id number]  HPI  Chief Complaint  Patient presents with  . Sleep Consult    Referred by Dr. Quay Burow for nocturnal hypoxia.     56 year old obese woman referred for evaluation of sleep disordered breathing. She underwent a sleep study [place] a few years ago and is not clear on whether she was told she had obstructive sleep apnea or not.  She reports being on oxygen from 2007 2012 and then improved to where it did not need oxygen for a few years and then it seems that she was started again on nocturnal oxygen in 2017.  Report of nocturnal oximetry is not available to me at the time of dictation.  Epworth sleepiness score is 15 and she reports sleepiness in various social situations. Review of her medications shows tizanidine, Percocet and gabapentin that she takes for peripheral neuropathy and she reports severe nerve pains and spasms.  She reports that this is leftover from earlier place of work and exposure to some kind of chemicals although I do note diabetes.  She also has a mini stroke.  She is slated for left total knee replacement on 11/30 and ambulates with a cane.  I also note the diagnosis of lupus on her problem list but review shows a weakly positive ANA 1 and 40 without confirmatory DS DNA test  Bedtime is between 8:30 PM and 11 but she is often falling asleep in the recliner prior to that. Sleep latency is between 30-45 minutes, she sleeps on her right side with 4 pillows, reports 3-4 nocturnal awakenings and is awake every 3 hours, with 1-2 bathroom visits, finally out of bed between 530 to 6:30 AM feeling tired with dryness of mouth and occasional headache. She often naps for about an hour in her chair while watching TV. She has gained 30-40 pounds over the last 2 years.  There is no history suggestive of cataplexy, sleep paralysis or parasomnias   She smokes about half a pack a day, about  20 pack years and drinks alcohol on occasion.  She quit using drugs in 1998 she is currently disabled and lives by herself. She reports symptoms of depression which she correlates with deaths in the family is compliant with Lexapro    Past Medical History:  Diagnosis Date  . Anxiety   . Arthritis   . CHF (congestive heart failure) ([place])   . COPD (chronic obstructive pulmonary disease) ([place])   . Coronary artery disease   . Depression   . Diabetes mellitus without complication ([place])   . GERD (gastroesophageal reflux disease)   . Hypertension   . Lupus   . Neuromuscular disorder ([place])   . Osteoporosis   . Oxygen deficiency    pt uses 02 at night   . Peripheral neuropathy   . Stroke [facility])      Past Surgical History:  Procedure Laterality Date  . ABDOMINAL HYSTERECTOMY     ovaries left  . APPENDECTOMY    . CHOLECYSTECTOMY    . IR GENERIC HISTORICAL  11/07/2015   IR ANGIO INTRA EXTRACRAN SEL COM CAROTID INNOMINATE BILAT MOD SED 11/07/2015 Luanne Bras, MD [facility]  . IR GENERIC HISTORICAL  11/07/2015   IR ANGIO VERTEBRAL SEL SUBCLAVIAN INNOMINATE UNI R MOD SED 11/07/2015 Luanne Bras, MD [facility]  . IR GENERIC HISTORICAL  11/07/2015   IR ANGIO VERTEBRAL SEL VERTEBRAL UNI L MOD  SED 11/07/2015 Luanne Bras, MD [facility]  . IR GENERIC HISTORICAL  11/07/2015   IR ANGIOGRAM EXTREMITY LEFT 11/07/2015 Luanne Bras, MD [facility]  . TONSILLECTOMY      No Known Allergies   Social History   Socioeconomic History  . Marital status: Single    Spouse name: n/a  . Number of children: 3  . Years of education: 12+  . Highest education level: Not on file  Social Needs  . Financial resource strain: Not on file  . Food insecurity - worry: Not on file  . Food insecurity - inability: Not on file  . Transportation needs - medical: Not on file  . Transportation needs - non-medical: Not on file  Occupational History  . 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 Smoking status: Current Every Day Smoker    Packs/day: 0.25    Years: 35.00    Pack years: 8.75    Types: Cigarettes  . Smokeless tobacco: Never Used  . Tobacco comment: referred  to smoking  cessation  classes. at  [facility]   Substance and Sexual Activity  . Alcohol use: No    Alcohol/week: 1.2 - 1.8 oz    Types: 2 - 3 Standard drinks or equivalent per week  . 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   Marland Kitchen Sexual activity: Yes    Partners: Female  Other Topics Concern  . Not on file  Social History Narrative   Moved to [place] from [place], [place] February 2017, to help her daughter.   Lives with her daughter.   Sons live in [place] and [place].   She reports that there were originally 17 children in her family (she is the youngest), the oldest are deceased, some prior to her birth, and she isn't sure which were female/female or how they died.     Family History  Problem Relation Age of Onset  . Hyperlipidemia Mother   . Hypertension Mother   . Hyperlipidemia Father   . Hypertension Father   . Stroke Father   . Hypertension Sister   . Cancer Sister        breast cancer  . Crohn's disease Sister         Review of Systems Positive for shortness of breath with activity, chest pain sometimes commensal heartburn, weight gain of 30 pounds, sore throat, headaches, depression pedal edema  Constitutional: negative for anorexia, fevers and sweats  Eyes: negative for irritation, redness and visual disturbance  Ears, nose, mouth, throat, and face: negative for earaches, epistaxis, nasal congestion and sore throat  Respiratory: negative for cough,  sputum and wheezing  Cardiovascular: negative for chest pain,  orthopnea, palpitations and syncope  Gastrointestinal: negative for abdominal pain, constipation, diarrhea, melena, nausea and vomiting  Genitourinary:negative for dysuria,  frequency and hematuria  Hematologic/lymphatic: negative for bleeding, easy bruising and lymphadenopathy  Musculoskeletal:negative for arthralgias, muscle weakness and stiff joints  Neurological: negative for coordination problems, gait problems, headaches and weakness  Endocrine: negative for diabetic symptoms including polydipsia, polyuria and weight loss      Objective:   Physical Exam  Gen. Pleasant, obese, in no distress, normal affect ENT - no lesions, no post nasal drip, class 2-3 airway Neck: No JVD, no thyromegaly, no carotid bruits Lungs: no use of accessory muscles, no dullness to percussion, decreased without rales or rhonchi  Cardiovascular: Rhythm regular, heart sounds  normal, no  murmurs or gallops, no peripheral edema Abdomen: soft and non-tender, no hepatosplenomegaly, BS normal. Musculoskeletal: No deformities, no cyanosis or clubbing, pigmentation or lower extremities Neuro:  alert, non focal, no tremors       Assessment & Plan:

## 2017-02-23 NOTE — Assessment & Plan Note (Signed)
Will need spirometry in the future. Smoking cessation was emphasized today is the most important intervention

## 2017-02-23 NOTE — Telephone Encounter (Signed)
Spoke with pt to inform. Appt scheduled for friday

## 2017-02-23 NOTE — Assessment & Plan Note (Addendum)
Given excessive daytime somnolence, narrow pharyngeal exam, witnessed apneas & loud snoring, obstructive sleep apnea is very likely & an overnight polysomnogram will be scheduled as a split study. The pathophysiology of obstructive sleep apnea , it's cardiovascular consequences & modes of treatment including CPAP were discused with the patient in detail & they evidenced understanding.  Pretest probability is intermediate. Since she is on home oxygen, will need attended polysomnogram to clarify She would be at increased risk for perioperative complications, would take usual precautions for anesthesia - Intra-Op postop

## 2017-02-23 NOTE — Telephone Encounter (Signed)
I can not just send in antibiotics without seeing her.  She should make an appointment to be seen

## 2017-02-24 ENCOUNTER — Other Ambulatory Visit: Payer: Self-pay | Admitting: Internal Medicine

## 2017-02-25 ENCOUNTER — Other Ambulatory Visit (INDEPENDENT_AMBULATORY_CARE_PROVIDER_SITE_OTHER): Payer: Self-pay | Admitting: Physician Assistant

## 2017-02-25 ENCOUNTER — Encounter: Payer: Self-pay | Admitting: Gastroenterology

## 2017-02-25 NOTE — Progress Notes (Signed)
Please place orders in Epic as patient is being scheduled for a pre-op appointment! Thank you! 

## 2017-02-26 ENCOUNTER — Other Ambulatory Visit: Payer: Self-pay | Admitting: Internal Medicine

## 2017-02-26 ENCOUNTER — Other Ambulatory Visit: Payer: 59

## 2017-02-26 ENCOUNTER — Other Ambulatory Visit: Payer: Medicare Other

## 2017-02-26 ENCOUNTER — Encounter: Payer: Self-pay | Admitting: Internal Medicine

## 2017-02-26 ENCOUNTER — Ambulatory Visit (INDEPENDENT_AMBULATORY_CARE_PROVIDER_SITE_OTHER): Payer: Medicare Other | Admitting: Internal Medicine

## 2017-02-26 VITALS — BP 130/86 | HR 78 | Temp 98.6°F | Resp 16 | Wt 298.0 lb

## 2017-02-26 DIAGNOSIS — R300 Dysuria: Secondary | ICD-10-CM | POA: Diagnosis not present

## 2017-02-26 DIAGNOSIS — L089 Local infection of the skin and subcutaneous tissue, unspecified: Secondary | ICD-10-CM

## 2017-02-26 DIAGNOSIS — E119 Type 2 diabetes mellitus without complications: Secondary | ICD-10-CM | POA: Diagnosis not present

## 2017-02-26 DIAGNOSIS — M5136 Other intervertebral disc degeneration, lumbar region: Secondary | ICD-10-CM

## 2017-02-26 DIAGNOSIS — K122 Cellulitis and abscess of mouth: Secondary | ICD-10-CM | POA: Insufficient documentation

## 2017-02-26 DIAGNOSIS — L729 Follicular cyst of the skin and subcutaneous tissue, unspecified: Secondary | ICD-10-CM

## 2017-02-26 DIAGNOSIS — B373 Candidiasis of vulva and vagina: Secondary | ICD-10-CM

## 2017-02-26 DIAGNOSIS — B3731 Acute candidiasis of vulva and vagina: Secondary | ICD-10-CM

## 2017-02-26 DIAGNOSIS — J069 Acute upper respiratory infection, unspecified: Secondary | ICD-10-CM | POA: Diagnosis not present

## 2017-02-26 DIAGNOSIS — M65331 Trigger finger, right middle finger: Secondary | ICD-10-CM | POA: Diagnosis not present

## 2017-02-26 LAB — POCT URINALYSIS DIPSTICK
Bilirubin, UA: NEGATIVE
Blood, UA: NEGATIVE
Glucose, UA: NEGATIVE
Ketones, UA: NEGATIVE
Leukocytes, UA: NEGATIVE
Nitrite, UA: NEGATIVE
Protein, UA: NEGATIVE
Spec Grav, UA: 1.02 (ref 1.010–1.025)
Urobilinogen, UA: 0.2 E.U./dL
pH, UA: 6 (ref 5.0–8.0)

## 2017-02-26 MED ORDER — FLUCONAZOLE 150 MG PO TABS
ORAL_TABLET | ORAL | 0 refills | Status: DC
Start: 2017-02-26 — End: 2017-04-29

## 2017-02-26 MED ORDER — AMOXICILLIN-POT CLAVULANATE 875-125 MG PO TABS
1.0000 | ORAL_TABLET | Freq: Two times a day (BID) | ORAL | 0 refills | Status: DC
Start: 2017-02-26 — End: 2017-03-15

## 2017-02-26 NOTE — Assessment & Plan Note (Signed)
?    Viral versus bacterial Needs to be on antibiotics for several other reasons Will prescribe Augmentin, which would cover an upper respiratory bacterial infection Call if no improvement

## 2017-02-26 NOTE — Progress Notes (Signed)
Subjective:    Patient ID: Tammy Strickland, female    DOB: 01/18/1961, 56 y.o.   MRN: [id number]  HPI She is here for an acute visit.   Cough:  She started having cold symptoms 4 days ago.  She is taking robitussin and vicks.  Her cough is dry primarily, but occasionally productive.  She does have wheezing, but has chronic wheezing.  She has nasal congestion, sinus pressure, sore throat and headaches.  She denies any fevers or chills or shortness of breath..    Lip swelling:  Two days ago she developed a lump in her cheek - it is sore on the inside. The lower lip is swollen.   The lump in her lip is getting larger.  She has been applying warm compresses.  She denies any discharge.  Boil:  She has a boil on her left breast.  It was a blister and she popped it.  There is still pus.  It is still firm.  It is a little sore.  There was surrounding erythema, but this has improved.  ? UTI:  At least 5 days ago.  She is having vulvar itching and burning sensation.  She has some vaginal discharge and it is light yellow.  She denies dysuria.  She is urinating more frequently.  She denies blood in the urine.  She denies abdominal pain.  She does not seen GYN.  She is not sexually active.    Pain:  She took the pain medication today.  She has established with pain management and they will be prescribing her medication.    In the afternoon her sugars drops daily.  The lowest was 11.  She is taking her medication as prescribed.  She is eating 3/day.  When her sugar dropped that low she did feel like she was going to pass out.  Medications and allergies reviewed with patient and updated if appropriate.  Patient Active Problem List   Diagnosis Date Noted  . OSA (obstructive sleep apnea) 02/23/2017  . Acute gouty arthritis 12/25/2016  . Stroke ([place])   . Chronic fatigue 11/09/2016  . Hypoxia 11/09/2016  . Morbidly obese ([place]) 11/09/2016  . RLQ abdominal pain 07/21/2016  . Rash 07/21/2016  . Itching  07/21/2016  . Nonintractable headache 07/21/2016  . Allergic rhinitis 07/16/2016  . Vitamin D deficiency 04/16/2016  . Encounter for chronic pain management 03/12/2016  . Hyperlipidemia 01/15/2016  . Gait disorder 12/04/2015  . Diabetes ([place]) 11/26/2015  . Chronic diastolic congestive heart failure ([place]) 11/06/2015  . TIA (transient ischemic attack)   . Carotid-cavernous fistula   . Chest pain 10/31/2015  . Insomnia 10/10/2015  . Depression 10/10/2015  . Benign essential HTN 08/15/2015  . Fibromyalgia 08/15/2015  . Peripheral neuropathy 08/15/2015  . CAD (coronary artery disease) 08/15/2015  . COPD (chronic obstructive pulmonary disease) ([place]) 08/15/2015  . Osteoporosis 08/15/2015  . 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 DDD (degenerative disc disease), lumbar 08/15/2015  . Occupational exposure to industrial toxins 08/15/2015  . GERD (gastroesophageal reflux disease) 08/15/2015    Current Outpatient Medications on File Prior to Visit  Medication Sig Dispense Refill  . amitriptyline (ELAVIL) 25 MG tablet TAKE TWO TABLETS BY MOUTH AT BEDTIME 180 tablet 0  . aspirin EC 81 MG tablet Take 1 tablet (81 mg total) by mouth daily.    Marland Kitchen atorvastatin (LIPITOR) 20 MG tablet TAKE 1 TABLET(20 MG) BY MOUTH DAILY 90 tablet 1  . clonazePAM (KLONOPIN) 0.5 MG tablet Take 1  tablet (0.5 mg total) by mouth 2 (two) times daily as needed for anxiety. 20 tablet 0  . cloNIDine (CATAPRES) 0.3 MG tablet Take 1 tablet (0.3 mg total) by mouth daily. 90 tablet 1  . Dulaglutide (TRULICITY) 1.5 PP/2.9JJ SOPN Inject 1.5 mg into the skin once a week. 12 pen 1  . escitalopram (LEXAPRO) 10 MG tablet Take 1 tablet (10 mg total) by mouth daily. 90 tablet 3  . fluticasone (FLONASE) 50 MCG/ACT nasal spray SHAKE LIQUID AND INSTILL 1 SPRAY INTO EACH NOSTRIL ONCE DAILY AS NEEDED FOR ALLERGIES 16 g 11  . furosemide (LASIX) 80 MG tablet Take 1 tablet (80 mg total) by mouth daily. 90 tablet 1  . gabapentin (NEURONTIN) 600 MG tablet  Take 2 tablets (1,200 mg total) by mouth 3 (three) times daily. 540 tablet 1  . glimepiride (AMARYL) 2 MG tablet TAKE ONE TABLET BY MOUTH EVERY DAY 90 tablet 0  . glucose blood test strip Use as instructed to check sugar three times a day 200 each 5  . Ipratropium-Albuterol (COMBIVENT) 20-100 MCG/ACT AERS respimat Inhale 1 puff into the lungs every 6 (six) hours. 3 Inhaler 1  . Lancets MISC Use as directed twice per day 200 each 5  . losartan (COZAAR) 50 MG tablet Take 1 tablet (50 mg total) by mouth 2 (two) times daily. 180 tablet 1  . metFORMIN (GLUCOPHAGE-XR) 500 MG 24 hr tablet TAKE 2 TABLETS BY MOUTH 2  TIMES DAILY WITH A MEAL. 360 tablet 1  . metoprolol (TOPROL-XL) 200 MG 24 hr tablet Take 1 tablet (200 mg total) by mouth daily. 90 tablet 1  . nitroGLYCERIN (NITROSTAT) 0.4 MG SL tablet Place 0.4 mg under the tongue every 5 (five) minutes as needed for chest pain.    Marland Kitchen omeprazole (PRILOSEC) 20 MG capsule Take 1 capsule (20 mg total) by mouth daily. 90 capsule 1  . oxyCODONE-acetaminophen (PERCOCET) 10-325 MG tablet Take 1 tablet by mouth every 8 (eight) hours as needed for pain. 90 tablet 0  . potassium chloride SA (K-DUR,KLOR-CON) 20 MEQ tablet Take 1 tablet (20 mEq total) by mouth daily. 90 tablet 1  . tiZANidine (ZANAFLEX) 4 MG tablet TAKE 2 AND ONE-HALF TABLET BY MOUTH 3 TIMES A DAY 225 tablet 1  . traZODone (DESYREL) 50 MG tablet TAKE 1 TABLET(50 MG) BY MOUTH AT BEDTIME 90 tablet 0   No current facility-administered medications on file prior to visit.     Past Medical History:  Diagnosis Date  . Anxiety   . Arthritis   . CHF (congestive heart failure) ([place])   . COPD (chronic obstructive pulmonary disease) ([place])   . Coronary artery disease   . Depression   . Diabetes mellitus without complication ([place])   . GERD (gastroesophageal reflux disease)   . Hypertension   . Lupus   . Neuromuscular disorder ([place])   . Osteoporosis   . Oxygen deficiency    pt uses 02 at night   .  Peripheral neuropathy   . Stroke [facility])     Past Surgical History:  Procedure Laterality Date  . ABDOMINAL HYSTERECTOMY     ovaries left  . APPENDECTOMY    . CHOLECYSTECTOMY    . IR GENERIC HISTORICAL  11/07/2015   IR ANGIO INTRA EXTRACRAN SEL COM CAROTID INNOMINATE BILAT MOD SED 11/07/2015 Luanne Bras, MD [facility]  . IR GENERIC HISTORICAL  11/07/2015   IR ANGIO VERTEBRAL SEL SUBCLAVIAN INNOMINATE UNI R MOD SED 11/07/2015 Luanne Bras, MD [facility]  .  IR GENERIC HISTORICAL  11/07/2015   IR ANGIO VERTEBRAL SEL VERTEBRAL UNI L MOD SED 11/07/2015 Luanne Bras, MD [facility]  . IR GENERIC HISTORICAL  11/07/2015   IR ANGIOGRAM EXTREMITY LEFT 11/07/2015 Luanne Bras, MD [facility]  . TONSILLECTOMY      Social History   Socioeconomic History  . Marital status: Single    Spouse name: n/a  . Number of children: 3  . Years of education: 12+  . Highest education level: None  Social Needs  . Financial resource strain: None  . Food insecurity - worry: None  . Food insecurity - inability: None  . Transportation needs - medical: None  . Transportation needs - non-medical: None  Occupational History  . 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 Smoking status: Current Every Day Smoker    Packs/day: 0.25    Years: 35.00    Pack years: 8.75    Types: Cigarettes  . Smokeless tobacco: Never Used  . Tobacco comment: referred  to smoking  cessation  classes. at  [facility]   Substance and Sexual Activity  . Alcohol use: No    Alcohol/week: 1.2 - 1.8 oz    Types: 2 - 3 Standard drinks or equivalent per week  . 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   Marland Kitchen Sexual activity: Yes    Partners: Female  Other Topics Concern  . None  Social History Narrative   Moved to [place] from [place], [place] February 2017, to help her daughter.   Lives with her daughter.   Sons live in  [place] and [place].   She reports that there were originally 17 children in her family (she is the youngest), the oldest are deceased, some prior to her birth, and she isn't sure which were female/female or how they died.    Family History  Problem Relation Age of Onset  . Hyperlipidemia Mother   . Hypertension Mother   . Hyperlipidemia Father   . Hypertension Father   . Stroke Father   . Hypertension Sister   . Cancer Sister        breast cancer  . Crohn's disease Sister     Review of Systems  Constitutional: Negative for chills and fever.  HENT: Positive for congestion (mild), sinus pressure and sore throat. Negative for sinus pain and trouble swallowing.   Respiratory: Positive for cough (productive at times) and wheezing. Negative for shortness of breath.   Gastrointestinal: Negative for abdominal pain and nausea.  Genitourinary: Positive for frequency and vaginal discharge. Negative for dysuria and hematuria.  Neurological: Positive for headaches.       Objective:   Vitals:   02/26/17 1104  BP: 130/86  Pulse: 78  Resp: 16  Temp: 98.6 F (37 C)  SpO2: 94%   Filed Weights   02/26/17 1104  Weight: 298 lb (135.2 kg)   Body mass index is 46.67 kg/m.  Wt Readings from Last 3 Encounters:  02/26/17 298 lb (135.2 kg)  02/23/17 298 lb (135.2 kg)  02/11/17 (!) 302 lb (137 kg)     Physical Exam  Constitutional: She appears well-developed and well-nourished. No distress.  HENT:  Head: Normocephalic and atraumatic.  Right Ear: External ear normal.  Left Ear: External ear normal.  Mouth/Throat: Oropharynx is clear and moist. No oropharyngeal exudate.  Bilateral ear canals and tympanic membranes normal.  Abscess left lower cheek that is tender  on the inside of her mouth, no tenderness outside, slightly swollen left lower lip, no active discharge  Eyes: Conjunctivae are normal.  Neck: Neck supple. No tracheal deviation present. No thyromegaly present.    Cardiovascular: Normal rate and regular rhythm.  Pulmonary/Chest: Effort normal. No respiratory distress. She has no wheezes. She has no rales.  Genitourinary:  Genitourinary Comments: Exam deferred  Lymphadenopathy:    She has no cervical adenopathy.  Skin: She is not diaphoretic.  Cyst upper left breast that is indurated, no surrounding erythema, minimally tender, slight discharge with palpation that is solid          Assessment & Plan:   See Problem List for Assessment and Plan of chronic medical problems.

## 2017-02-26 NOTE — Patient Instructions (Addendum)
  Medications reviewed and updated.  Changes include:  1. Stop the glimepiride 2. Start augmentin for the cold symptoms, boil on your chest and mouth infection 3. Start fluconazole and take as directed  Continue warm compresses on the mouth and chest.    Your prescription(s) have been submitted to your pharmacy. Please take as directed and contact our office if you believe you are having problem(s) with the medication(s).

## 2017-02-26 NOTE — Assessment & Plan Note (Addendum)
She was able to drain the cyst and that did help with the pain and surrounding erythema The area is still indurated Continue warm compresses Augmentin started Culture sent

## 2017-02-26 NOTE — Assessment & Plan Note (Signed)
In the left lower cheek Firm, not draining Continue warm compresses Will start Augmentin twice daily times 10 days Call if no improvement

## 2017-02-26 NOTE — Assessment & Plan Note (Signed)
She is compliant with her medication Her sugars have been dropping too low during the day often Discontinue glimepiride Continue other medications She will continue to monitor her sugars closely

## 2017-02-26 NOTE — Assessment & Plan Note (Signed)
Her symptoms are consistent with candidiasis Fluconazole prescribed If no improvement she may need to see a gynecologist

## 2017-02-26 NOTE — Addendum Note (Signed)
Addended by: Terence Lux B on: 02/26/2017 03:25 PM   Modules accepted: Orders

## 2017-02-26 NOTE — Assessment & Plan Note (Signed)
Urine dip not suggestive of UTI We will send for culture Symptoms likely consistent with vulvar/vaginal candidiasis

## 2017-02-27 LAB — URINE CULTURE
MICRO NUMBER:: 81295680
Result:: NO GROWTH
SPECIMEN QUALITY:: ADEQUATE

## 2017-03-01 ENCOUNTER — Ambulatory Visit (HOSPITAL_BASED_OUTPATIENT_CLINIC_OR_DEPARTMENT_OTHER): Payer: Medicare Other

## 2017-03-02 LAB — WOUND CULTURE
MICRO NUMBER:: 81295689
SPECIMEN QUALITY:: ADEQUATE

## 2017-03-03 ENCOUNTER — Other Ambulatory Visit (HOSPITAL_COMMUNITY): Payer: Self-pay | Admitting: Emergency Medicine

## 2017-03-03 ENCOUNTER — Ambulatory Visit (HOSPITAL_BASED_OUTPATIENT_CLINIC_OR_DEPARTMENT_OTHER): Payer: Medicare Other | Attending: Pulmonary Disease | Admitting: Pulmonary Disease

## 2017-03-03 VITALS — Ht 66.0 in | Wt 298.0 lb

## 2017-03-03 DIAGNOSIS — J449 Chronic obstructive pulmonary disease, unspecified: Secondary | ICD-10-CM | POA: Insufficient documentation

## 2017-03-03 DIAGNOSIS — I509 Heart failure, unspecified: Secondary | ICD-10-CM | POA: Insufficient documentation

## 2017-03-03 DIAGNOSIS — Z6841 Body Mass Index (BMI) 40.0 and over, adult: Secondary | ICD-10-CM | POA: Diagnosis not present

## 2017-03-03 DIAGNOSIS — Z79899 Other long term (current) drug therapy: Secondary | ICD-10-CM | POA: Insufficient documentation

## 2017-03-03 DIAGNOSIS — E669 Obesity, unspecified: Secondary | ICD-10-CM | POA: Insufficient documentation

## 2017-03-03 DIAGNOSIS — G4733 Obstructive sleep apnea (adult) (pediatric): Secondary | ICD-10-CM | POA: Diagnosis not present

## 2017-03-03 NOTE — Progress Notes (Signed)
URINE CULTURE, URINALYSIS 02-26-17 Epic  EKG 08-11-16  CXR 08-11-16 Epic  ECHO 11-01-15 Epic

## 2017-03-03 NOTE — Patient Instructions (Addendum)
Tammy Strickland  03/03/2017   Your procedure is scheduled on: 03-12-17  Report to [facility] Main  Entrance     Report to admitting at 515AM   Call this number if you have problems the morning of surgery [phone]   Remember: ONLY 1 PERSON MAY GO WITH YOU TO SHORT STAY TO GET  READY MORNING OF YOUR SURGERY.  Do not eat food or drink liquids :After Midnight.     Take these medicines the morning of surgery with A SIP OF WATER: METOPROLOL, CLONIDINE, ESCITALOPRAM, GABAPENTIN, ATORVASTATIN, PERCOCET IF NEEDED, INHALER AS NEEDED ([place]), NASAL SPRAY AS NEEDED                                You may not have any metal on your body including hair pins and              piercings  Do not wear jewelry, make-up, lotions, powders or perfumes, deodorant             Do not wear nail polish.  Do not shave  48 hours prior to surgery.             Do not bring valuables to the hospital. [place].  Contacts, dentures or bridgework may not be worn into surgery.  Leave suitcase in the car. After surgery it may be brought to your room.                 Please read over the following fact sheets you were given: _____________________________________________________________________            How to Manage Your Diabetes Before and After Surgery  Why is it important to control my blood sugar before and after surgery? . Improving blood sugar levels before and after surgery helps healing and can limit problems. . A way of improving blood sugar control is eating a healthy diet by: o  Eating less sugar and carbohydrates o  Increasing activity/exercise o  Talking with your doctor about reaching your blood sugar goals . High blood sugars (greater than 180 mg/dL) can raise your risk of infections and slow your recovery, so you will need to focus on controlling your diabetes during the weeks before surgery. . Make  sure that the doctor who takes care of your diabetes knows about your planned surgery including the date and location.  How do I manage my blood sugar before surgery? . Check your blood sugar at least 4 times a day, starting 2 days before surgery, to make sure that the level is not too high or low. o Check your blood sugar the morning of your surgery when you wake up and every 2 hours until you get to the Short Stay unit. . If your blood sugar is less than 70 mg/dL, you will need to treat for low blood sugar: o Do not take insulin. o Treat a low blood sugar (less than 70 mg/dL) with  cup of clear juice (cranberry or apple), 4 glucose tablets, OR glucose gel. o Recheck blood sugar in 15 minutes after treatment (to make sure it is greater than 70 mg/dL). If your blood sugar is not greater than 70 mg/dL on recheck, call  [phone] for further instructions. . Report your blood sugar to the short stay nurse when you get to Short Stay.  . If you are admitted to the hospital after surgery: o Your blood sugar will be checked by the staff and you will probably be given insulin after surgery (instead of oral diabetes medicines) to make sure you have good blood sugar levels. o The goal for blood sugar control after surgery is 80-180 mg/dL.   WHAT DO I DO ABOUT MY DIABETES MEDICATION?   . THE DAY BEFORE SURGERY, take METFORMIN as normal       . THE MORNING OF SURGERY, DO NOT TAKE ANY DIABETIC MEDICATIONS!!  Marland Kitchen The day of surgery, do not take other diabetes injectables, including Byetta (exenatide), Bydureon (exenatide ER), Victoza (liraglutide), or Trulicity (dulaglutide).   Patient Signature:  Date:   Nurse Signature:  Date:   Reviewed and Endorsed by [facility] Patient Education Committee, August 2015   [facility] - Preparing for Surgery Before surgery, you can play an important role.  Because skin is not sterile, your skin needs to be as free of germs as possible.  You can reduce the  number of germs on your skin by washing with CHG (chlorahexidine gluconate) soap before surgery.  CHG is an antiseptic cleaner which kills germs and bonds with the skin to continue killing germs even after washing. Please DO NOT use if you have an allergy to CHG or antibacterial soaps.  If your skin becomes reddened/irritated stop using the CHG and inform your nurse when you arrive at Short Stay. Do not shave (including legs and underarms) for at least 48 hours prior to the first CHG shower.  You may shave your face/neck. Please follow these instructions carefully:  1.  Shower with CHG Soap the night before surgery and the  morning of Surgery.  2.  If you choose to wash your hair, wash your hair first as usual with your  normal  shampoo.  3.  After you shampoo, rinse your hair and body thoroughly to remove the  shampoo.                           4.  Use CHG as you would any other liquid soap.  You can apply chg directly  to the skin and wash                       Gently with a scrungie or clean washcloth.  5.  Apply the CHG Soap to your body ONLY FROM THE NECK DOWN.   Do not use on face/ open                           Wound or open sores. Avoid contact with eyes, ears mouth and genitals (private parts).                       Wash face,  Genitals (private parts) with your normal soap.             6.  Wash thoroughly, paying special attention to the area where your surgery  will be performed.  7.  Thoroughly rinse your body with warm water from the neck down.  8.  DO NOT shower/wash with your normal soap after using and rinsing off  the CHG Soap.  9.  Pat yourself dry with a clean towel.            10.  Wear clean pajamas.            11.  Place clean sheets on your bed the night of your first shower and do not  sleep with pets. Day of Surgery : Do not apply any lotions/deodorants the morning of surgery.  Please wear clean clothes to the hospital/surgery center.  FAILURE TO FOLLOW  THESE INSTRUCTIONS MAY RESULT IN THE CANCELLATION OF YOUR SURGERY PATIENT SIGNATURE_________________________________  NURSE SIGNATURE__________________________________  ________________________________________________________________________   Adam Phenix  An incentive spirometer is a tool that can help keep your lungs clear and active. This tool measures how well you are filling your lungs with each breath. Taking long deep breaths may help reverse or decrease the chance of developing breathing (pulmonary) problems (especially infection) following:  A long period of time when you are unable to move or be active. BEFORE THE PROCEDURE   If the spirometer includes an indicator to show your best effort, your nurse or respiratory therapist will set it to a desired goal.  If possible, sit up straight or lean slightly forward. Try not to slouch.  Hold the incentive spirometer in an upright position. INSTRUCTIONS FOR USE  1. Sit on the edge of your bed if possible, or sit up as far as you can in bed or on a chair. 2. Hold the incentive spirometer in an upright position. 3. Breathe out normally. 4. Place the mouthpiece in your mouth and seal your lips tightly around it. 5. Breathe in slowly and as deeply as possible, raising the piston or the ball toward the top of the column. 6. Hold your breath for 3-5 seconds or for as long as possible. Allow the piston or ball to fall to the bottom of the column. 7. Remove the mouthpiece from your mouth and breathe out normally. 8. Rest for a few seconds and repeat Steps 1 through 7 at least 10 times every 1-2 hours when you are awake. Take your time and take a few normal breaths between deep breaths. 9. The spirometer may include an indicator to show your best effort. Use the indicator as a goal to work toward during each repetition. 10. After each set of 10 deep breaths, practice coughing to be sure your lungs are clear. If you have an incision  (the cut made at the time of surgery), support your incision when coughing by placing a pillow or rolled up towels firmly against it. Once you are able to get out of bed, walk around indoors and cough well. You may stop using the incentive spirometer when instructed by your caregiver.  RISKS AND COMPLICATIONS  Take your time so you do not get dizzy or light-headed.  If you are in pain, you may need to take or ask for pain medication before doing incentive spirometry. It is harder to take a deep breath if you are having pain. AFTER USE  Rest and breathe slowly and easily.  It can be helpful to keep track of a log of your progress. Your caregiver can provide you with a simple table to help with this. If you are using the spirometer at home, follow these instructions: [place] IF:   You are having difficultly using the spirometer.  You have trouble using the spirometer as often as instructed.  Your pain medication is not giving enough relief while using the spirometer.  You  develop fever of 100.5 F (38.1 C) or higher. SEEK IMMEDIATE MEDICAL CARE IF:   You cough up bloody sputum that had not been present before.  You develop fever of 102 F (38.9 C) or greater.  You develop worsening pain at or near the incision site. MAKE SURE YOU:   Understand these instructions.  Will watch your condition.  Will get help right away if you are not doing well or get worse. Document Released: 08/10/2006 Document Revised: 06/22/2011 Document Reviewed: 10/11/2006 [facility] Patient Information 2014 [place], [place].   ________________________________________________________________________

## 2017-03-06 DIAGNOSIS — J449 Chronic obstructive pulmonary disease, unspecified: Secondary | ICD-10-CM | POA: Diagnosis not present

## 2017-03-08 ENCOUNTER — Ambulatory Visit: Payer: Self-pay | Admitting: Physician Assistant

## 2017-03-08 ENCOUNTER — Other Ambulatory Visit (HOSPITAL_BASED_OUTPATIENT_CLINIC_OR_DEPARTMENT_OTHER): Payer: Self-pay

## 2017-03-08 DIAGNOSIS — G4733 Obstructive sleep apnea (adult) (pediatric): Secondary | ICD-10-CM

## 2017-03-09 ENCOUNTER — Other Ambulatory Visit: Payer: Self-pay

## 2017-03-09 ENCOUNTER — Encounter (HOSPITAL_COMMUNITY)
Admission: RE | Admit: 2017-03-09 | Discharge: 2017-03-09 | Disposition: A | Discharge status: Home / Self Care | Payer: Medicare Other | Source: Ambulatory Visit | Attending: Orthopaedic Surgery | Admitting: Orthopaedic Surgery

## 2017-03-09 ENCOUNTER — Encounter (HOSPITAL_COMMUNITY): Payer: Self-pay

## 2017-03-09 DIAGNOSIS — I110 Hypertensive heart disease with heart failure: Secondary | ICD-10-CM | POA: Diagnosis not present

## 2017-03-09 DIAGNOSIS — R5382 Chronic fatigue, unspecified: Secondary | ICD-10-CM | POA: Diagnosis not present

## 2017-03-09 DIAGNOSIS — M797 Fibromyalgia: Secondary | ICD-10-CM | POA: Diagnosis not present

## 2017-03-09 DIAGNOSIS — Z01818 Encounter for other preprocedural examination: Secondary | ICD-10-CM

## 2017-03-09 DIAGNOSIS — E119 Type 2 diabetes mellitus without complications: Secondary | ICD-10-CM | POA: Insufficient documentation

## 2017-03-09 DIAGNOSIS — Z9981 Dependence on supplemental oxygen: Secondary | ICD-10-CM | POA: Diagnosis not present

## 2017-03-09 DIAGNOSIS — M25462 Effusion, left knee: Secondary | ICD-10-CM | POA: Diagnosis not present

## 2017-03-09 DIAGNOSIS — M25762 Osteophyte, left knee: Secondary | ICD-10-CM | POA: Diagnosis not present

## 2017-03-09 DIAGNOSIS — M1712 Unilateral primary osteoarthritis, left knee: Secondary | ICD-10-CM | POA: Diagnosis not present

## 2017-03-09 DIAGNOSIS — I2510 Atherosclerotic heart disease of native coronary artery without angina pectoris: Secondary | ICD-10-CM | POA: Diagnosis not present

## 2017-03-09 DIAGNOSIS — M810 Age-related osteoporosis without current pathological fracture: Secondary | ICD-10-CM | POA: Diagnosis not present

## 2017-03-09 DIAGNOSIS — M109 Gout, unspecified: Secondary | ICD-10-CM | POA: Diagnosis not present

## 2017-03-09 DIAGNOSIS — K219 Gastro-esophageal reflux disease without esophagitis: Secondary | ICD-10-CM | POA: Diagnosis not present

## 2017-03-09 DIAGNOSIS — E1142 Type 2 diabetes mellitus with diabetic polyneuropathy: Secondary | ICD-10-CM | POA: Diagnosis not present

## 2017-03-09 DIAGNOSIS — E785 Hyperlipidemia, unspecified: Secondary | ICD-10-CM | POA: Diagnosis not present

## 2017-03-09 DIAGNOSIS — G4733 Obstructive sleep apnea (adult) (pediatric): Secondary | ICD-10-CM | POA: Diagnosis not present

## 2017-03-09 DIAGNOSIS — G4700 Insomnia, unspecified: Secondary | ICD-10-CM | POA: Diagnosis not present

## 2017-03-09 DIAGNOSIS — I5032 Chronic diastolic (congestive) heart failure: Secondary | ICD-10-CM | POA: Diagnosis not present

## 2017-03-09 DIAGNOSIS — E559 Vitamin D deficiency, unspecified: Secondary | ICD-10-CM | POA: Diagnosis not present

## 2017-03-09 DIAGNOSIS — J449 Chronic obstructive pulmonary disease, unspecified: Secondary | ICD-10-CM | POA: Diagnosis not present

## 2017-03-09 HISTORY — DX: Sleep apnea, unspecified: G47.30

## 2017-03-09 LAB — CBC
HCT: 37.5 % (ref 36.0–46.0)
Hemoglobin: 12.3 g/dL (ref 12.0–15.0)
MCH: 29.4 pg (ref 26.0–34.0)
MCHC: 32.8 g/dL (ref 30.0–36.0)
MCV: 89.5 fL (ref 78.0–100.0)
Platelets: 278 10*3/uL (ref 150–400)
RBC: 4.19 MIL/uL (ref 3.87–5.11)
RDW: 14.5 % (ref 11.5–15.5)
WBC: 10.6 10*3/uL — ABNORMAL HIGH (ref 4.0–10.5)

## 2017-03-09 LAB — HEMOGLOBIN A1C
Hgb A1c MFr Bld: 6.8 % — ABNORMAL HIGH (ref 4.8–5.6)
Mean Plasma Glucose: 148.46 mg/dL

## 2017-03-09 LAB — BASIC METABOLIC PANEL
Anion gap: 11 (ref 5–15)
BUN: 10 mg/dL (ref 6–20)
CO2: 28 mmol/L (ref 22–32)
Calcium: 8.7 mg/dL — ABNORMAL LOW (ref 8.9–10.3)
Chloride: 101 mmol/L (ref 101–111)
Creatinine, Ser: 0.71 mg/dL (ref 0.44–1.00)
GFR calc Af Amer: >60 mL/min (ref >60)
GFR calc non Af Amer: >60 mL/min (ref >60)
Glucose, Bld: 158 mg/dL — ABNORMAL HIGH (ref 65–99)
Potassium: 3.4 mmol/L — ABNORMAL LOW (ref 3.5–5.1)
Sodium: 140 mmol/L (ref 135–145)

## 2017-03-09 LAB — SURGICAL PCR SCREEN
MRSA, PCR: NEGATIVE
Staphylococcus aureus: NEGATIVE

## 2017-03-09 LAB — GLUCOSE, CAPILLARY: Glucose-Capillary: 191 mg/dL — ABNORMAL HIGH (ref 65–99)

## 2017-03-11 ENCOUNTER — Other Ambulatory Visit (INDEPENDENT_AMBULATORY_CARE_PROVIDER_SITE_OTHER): Payer: Self-pay

## 2017-03-11 MED ORDER — DEXTROSE 5 % IV SOLN
3.0000 g | INTRAVENOUS | Status: AC
  Administered 2017-03-12: 3 g via INTRAVENOUS
  Filled 2017-03-11: qty 3

## 2017-03-12 ENCOUNTER — Other Ambulatory Visit: Payer: Self-pay

## 2017-03-12 ENCOUNTER — Inpatient Hospital Stay (HOSPITAL_COMMUNITY): Payer: Medicare Other | Admitting: Anesthesiology

## 2017-03-12 ENCOUNTER — Inpatient Hospital Stay (HOSPITAL_COMMUNITY): Payer: Medicare Other

## 2017-03-12 ENCOUNTER — Encounter (HOSPITAL_COMMUNITY): Payer: Self-pay | Admitting: *Deleted

## 2017-03-12 ENCOUNTER — Inpatient Hospital Stay (HOSPITAL_COMMUNITY)
Admission: RE | Admit: 2017-03-12 | Discharge: 2017-03-15 | DRG: 470 | Disposition: A | Discharge status: Skilled Nursing Facility | Payer: Medicare Other | Attending: Orthopaedic Surgery | Admitting: Orthopaedic Surgery

## 2017-03-12 ENCOUNTER — Encounter (HOSPITAL_COMMUNITY)
Admission: RE | Disposition: A | Discharge status: Skilled Nursing Facility | Payer: Self-pay | Source: Home / Self Care | Attending: Orthopaedic Surgery

## 2017-03-12 DIAGNOSIS — M109 Gout, unspecified: Secondary | ICD-10-CM | POA: Diagnosis present

## 2017-03-12 DIAGNOSIS — M25462 Effusion, left knee: Secondary | ICD-10-CM | POA: Diagnosis not present

## 2017-03-12 DIAGNOSIS — M1712 Unilateral primary osteoarthritis, left knee: Secondary | ICD-10-CM | POA: Diagnosis not present

## 2017-03-12 DIAGNOSIS — Z823 Family history of stroke: Secondary | ICD-10-CM

## 2017-03-12 DIAGNOSIS — M25762 Osteophyte, left knee: Secondary | ICD-10-CM | POA: Diagnosis not present

## 2017-03-12 DIAGNOSIS — I5032 Chronic diastolic (congestive) heart failure: Secondary | ICD-10-CM | POA: Diagnosis not present

## 2017-03-12 DIAGNOSIS — J449 Chronic obstructive pulmonary disease, unspecified: Secondary | ICD-10-CM | POA: Diagnosis not present

## 2017-03-12 DIAGNOSIS — Z8673 Personal history of transient ischemic attack (TIA), and cerebral infarction without residual deficits: Secondary | ICD-10-CM

## 2017-03-12 DIAGNOSIS — G4733 Obstructive sleep apnea (adult) (pediatric): Secondary | ICD-10-CM | POA: Diagnosis present

## 2017-03-12 DIAGNOSIS — F419 Anxiety disorder, unspecified: Secondary | ICD-10-CM | POA: Diagnosis present

## 2017-03-12 DIAGNOSIS — E119 Type 2 diabetes mellitus without complications: Secondary | ICD-10-CM | POA: Diagnosis not present

## 2017-03-12 DIAGNOSIS — Z96652 Presence of left artificial knee joint: Secondary | ICD-10-CM

## 2017-03-12 DIAGNOSIS — R5382 Chronic fatigue, unspecified: Secondary | ICD-10-CM | POA: Diagnosis present

## 2017-03-12 DIAGNOSIS — Z8349 Family history of other endocrine, nutritional and metabolic diseases: Secondary | ICD-10-CM

## 2017-03-12 DIAGNOSIS — Z96698 Presence of other orthopedic joint implants: Secondary | ICD-10-CM | POA: Diagnosis not present

## 2017-03-12 DIAGNOSIS — M129 Arthropathy, unspecified: Secondary | ICD-10-CM | POA: Diagnosis not present

## 2017-03-12 DIAGNOSIS — E785 Hyperlipidemia, unspecified: Secondary | ICD-10-CM | POA: Diagnosis present

## 2017-03-12 DIAGNOSIS — Z419 Encounter for procedure for purposes other than remedying health state, unspecified: Secondary | ICD-10-CM

## 2017-03-12 DIAGNOSIS — I5040 Unspecified combined systolic (congestive) and diastolic (congestive) heart failure: Secondary | ICD-10-CM | POA: Diagnosis not present

## 2017-03-12 DIAGNOSIS — I10 Essential (primary) hypertension: Secondary | ICD-10-CM | POA: Diagnosis not present

## 2017-03-12 DIAGNOSIS — M791 Myalgia, unspecified site: Secondary | ICD-10-CM | POA: Diagnosis not present

## 2017-03-12 DIAGNOSIS — M1380 Other specified arthritis, unspecified site: Secondary | ICD-10-CM | POA: Diagnosis not present

## 2017-03-12 DIAGNOSIS — K219 Gastro-esophageal reflux disease without esophagitis: Secondary | ICD-10-CM | POA: Diagnosis not present

## 2017-03-12 DIAGNOSIS — E559 Vitamin D deficiency, unspecified: Secondary | ICD-10-CM | POA: Diagnosis present

## 2017-03-12 DIAGNOSIS — G4700 Insomnia, unspecified: Secondary | ICD-10-CM | POA: Diagnosis present

## 2017-03-12 DIAGNOSIS — F329 Major depressive disorder, single episode, unspecified: Secondary | ICD-10-CM | POA: Diagnosis present

## 2017-03-12 DIAGNOSIS — E7849 Other hyperlipidemia: Secondary | ICD-10-CM | POA: Diagnosis not present

## 2017-03-12 DIAGNOSIS — F17210 Nicotine dependence, cigarettes, uncomplicated: Secondary | ICD-10-CM | POA: Diagnosis present

## 2017-03-12 DIAGNOSIS — M810 Age-related osteoporosis without current pathological fracture: Secondary | ICD-10-CM | POA: Diagnosis not present

## 2017-03-12 DIAGNOSIS — Z90710 Acquired absence of both cervix and uterus: Secondary | ICD-10-CM

## 2017-03-12 DIAGNOSIS — R2689 Other abnormalities of gait and mobility: Secondary | ICD-10-CM | POA: Diagnosis not present

## 2017-03-12 DIAGNOSIS — M6281 Muscle weakness (generalized): Secondary | ICD-10-CM | POA: Diagnosis not present

## 2017-03-12 DIAGNOSIS — E1142 Type 2 diabetes mellitus with diabetic polyneuropathy: Secondary | ICD-10-CM | POA: Diagnosis present

## 2017-03-12 DIAGNOSIS — Z8249 Family history of ischemic heart disease and other diseases of the circulatory system: Secondary | ICD-10-CM

## 2017-03-12 DIAGNOSIS — I110 Hypertensive heart disease with heart failure: Secondary | ICD-10-CM | POA: Diagnosis not present

## 2017-03-12 DIAGNOSIS — Z6841 Body Mass Index (BMI) 40.0 and over, adult: Secondary | ICD-10-CM

## 2017-03-12 DIAGNOSIS — I2510 Atherosclerotic heart disease of native coronary artery without angina pectoris: Secondary | ICD-10-CM | POA: Diagnosis not present

## 2017-03-12 DIAGNOSIS — E1140 Type 2 diabetes mellitus with diabetic neuropathy, unspecified: Secondary | ICD-10-CM | POA: Diagnosis not present

## 2017-03-12 DIAGNOSIS — Z4789 Encounter for other orthopedic aftercare: Secondary | ICD-10-CM | POA: Diagnosis not present

## 2017-03-12 DIAGNOSIS — G8918 Other acute postprocedural pain: Secondary | ICD-10-CM | POA: Diagnosis not present

## 2017-03-12 DIAGNOSIS — S8002XA Contusion of left knee, initial encounter: Secondary | ICD-10-CM | POA: Diagnosis not present

## 2017-03-12 DIAGNOSIS — M797 Fibromyalgia: Secondary | ICD-10-CM | POA: Diagnosis present

## 2017-03-12 DIAGNOSIS — M25562 Pain in left knee: Secondary | ICD-10-CM

## 2017-03-12 DIAGNOSIS — Z9981 Dependence on supplemental oxygen: Secondary | ICD-10-CM | POA: Diagnosis not present

## 2017-03-12 DIAGNOSIS — Z471 Aftercare following joint replacement surgery: Secondary | ICD-10-CM | POA: Diagnosis not present

## 2017-03-12 DIAGNOSIS — G8911 Acute pain due to trauma: Secondary | ICD-10-CM | POA: Diagnosis not present

## 2017-03-12 HISTORY — PX: TOTAL KNEE ARTHROPLASTY: SHX125

## 2017-03-12 LAB — GLUCOSE, CAPILLARY
Glucose-Capillary: 120 mg/dL — ABNORMAL HIGH (ref 65–99)
Glucose-Capillary: 135 mg/dL — ABNORMAL HIGH (ref 65–99)
Glucose-Capillary: 162 mg/dL — ABNORMAL HIGH (ref 65–99)
Glucose-Capillary: 177 mg/dL — ABNORMAL HIGH (ref 65–99)
Glucose-Capillary: 201 mg/dL — ABNORMAL HIGH (ref 65–99)
Glucose-Capillary: 191 mg/dL — ABNORMAL HIGH (ref 65–99)

## 2017-03-12 SURGERY — ARTHROPLASTY, KNEE, TOTAL
Anesthesia: Monitor Anesthesia Care | Site: Knee | Laterality: Left

## 2017-03-12 MED ORDER — POTASSIUM CHLORIDE CRYS ER 20 MEQ PO TBCR
20.0000 meq | EXTENDED_RELEASE_TABLET | Freq: Every day | ORAL | Status: DC
  Administered 2017-03-13 – 2017-03-15 (×3): 20 meq via ORAL
  Filled 2017-03-12 (×3): qty 1

## 2017-03-12 MED ORDER — PROPOFOL 500 MG/50ML IV EMUL
INTRAVENOUS | Status: DC | PRN
  Administered 2017-03-12: 100 ug/kg/min via INTRAVENOUS

## 2017-03-12 MED ORDER — ONDANSETRON HCL 4 MG/2ML IJ SOLN
INTRAMUSCULAR | Status: DC | PRN
  Administered 2017-03-12: 4 mg via INTRAVENOUS

## 2017-03-12 MED ORDER — PROPOFOL 10 MG/ML IV BOLUS
INTRAVENOUS | Status: AC
  Filled 2017-03-12: qty 60

## 2017-03-12 MED ORDER — METFORMIN HCL ER 500 MG PO TB24
500.0000 mg | ORAL_TABLET | Freq: Two times a day (BID) | ORAL | Status: DC
  Administered 2017-03-13 – 2017-03-15 (×5): 500 mg via ORAL
  Filled 2017-03-12 (×6): qty 1

## 2017-03-12 MED ORDER — DIPHENHYDRAMINE HCL 12.5 MG/5ML PO ELIX
12.5000 mg | ORAL_SOLUTION | ORAL | Status: DC | PRN
  Administered 2017-03-12: 25 mg via ORAL
  Filled 2017-03-12: qty 10

## 2017-03-12 MED ORDER — ESCITALOPRAM OXALATE 10 MG PO TABS
10.0000 mg | ORAL_TABLET | Freq: Every day | ORAL | Status: DC
  Administered 2017-03-13 – 2017-03-15 (×3): 10 mg via ORAL
  Filled 2017-03-12 (×3): qty 1

## 2017-03-12 MED ORDER — NITROGLYCERIN 0.4 MG SL SUBL: 0.4000 mg | SUBLINGUAL_TABLET | SUBLINGUAL | Status: DC | PRN

## 2017-03-12 MED ORDER — STERILE WATER FOR IRRIGATION IR SOLN
Status: DC | PRN
  Administered 2017-03-12: 2000 mL

## 2017-03-12 MED ORDER — SODIUM CHLORIDE 0.9 % IR SOLN
Status: DC | PRN
  Administered 2017-03-12: 2000 mL

## 2017-03-12 MED ORDER — GABAPENTIN 400 MG PO CAPS
1200.0000 mg | ORAL_CAPSULE | Freq: Three times a day (TID) | ORAL | Status: DC
  Administered 2017-03-12 – 2017-03-15 (×7): 1200 mg via ORAL
  Filled 2017-03-12 (×7): qty 3

## 2017-03-12 MED ORDER — 0.9 % SODIUM CHLORIDE (POUR BTL) OPTIME
TOPICAL | Status: DC | PRN
  Administered 2017-03-12: 1000 mL

## 2017-03-12 MED ORDER — METOPROLOL SUCCINATE ER 50 MG PO TB24
200.0000 mg | ORAL_TABLET | Freq: Two times a day (BID) | ORAL | Status: DC
  Administered 2017-03-12 – 2017-03-15 (×6): 200 mg via ORAL
  Filled 2017-03-12 (×7): qty 4

## 2017-03-12 MED ORDER — ONDANSETRON HCL 4 MG PO TABS
4.0000 mg | ORAL_TABLET | Freq: Four times a day (QID) | ORAL | Status: DC | PRN
  Administered 2017-03-14: 4 mg via ORAL
  Filled 2017-03-12: qty 1

## 2017-03-12 MED ORDER — IPRATROPIUM-ALBUTEROL 0.5-2.5 (3) MG/3ML IN SOLN: 3.0000 mL | Freq: Four times a day (QID) | RESPIRATORY_TRACT | Status: DC | PRN

## 2017-03-12 MED ORDER — ACETAMINOPHEN 650 MG RE SUPP: 650.0000 mg | RECTAL | Status: DC | PRN

## 2017-03-12 MED ORDER — OXYCODONE HCL 5 MG PO TABS
10.0000 mg | ORAL_TABLET | ORAL | Status: DC | PRN
  Administered 2017-03-12: 10 mg via ORAL
  Administered 2017-03-12 – 2017-03-15 (×5): 15 mg via ORAL
  Filled 2017-03-12 (×6): qty 3
  Filled 2017-03-12: qty 2

## 2017-03-12 MED ORDER — SODIUM CHLORIDE 0.9 % IV SOLN
INTRAVENOUS | Status: DC
  Administered 2017-03-12: 16:00:00 via INTRAVENOUS

## 2017-03-12 MED ORDER — FUROSEMIDE 40 MG PO TABS
80.0000 mg | ORAL_TABLET | Freq: Every day | ORAL | Status: DC
  Administered 2017-03-13 – 2017-03-15 (×3): 80 mg via ORAL
  Filled 2017-03-12 (×3): qty 2

## 2017-03-12 MED ORDER — ONDANSETRON HCL 4 MG/2ML IJ SOLN
INTRAMUSCULAR | Status: AC
  Filled 2017-03-12: qty 2

## 2017-03-12 MED ORDER — ALUM & MAG HYDROXIDE-SIMETH 200-200-20 MG/5ML PO SUSP: 30.0000 mL | ORAL | Status: DC | PRN

## 2017-03-12 MED ORDER — LACTATED RINGERS IV SOLN
INTRAVENOUS | Status: DC | PRN
  Administered 2017-03-12 (×2): via INTRAVENOUS

## 2017-03-12 MED ORDER — MENTHOL 3 MG MT LOZG: 1.0000 | LOZENGE | OROMUCOSAL | Status: DC | PRN

## 2017-03-12 MED ORDER — ROPIVACAINE HCL 7.5 MG/ML IJ SOLN
INTRAMUSCULAR | Status: DC | PRN
  Administered 2017-03-12: 20 mL via PERINEURAL

## 2017-03-12 MED ORDER — PHENYLEPHRINE HCL 10 MG/ML IJ SOLN
INTRAMUSCULAR | Status: AC
  Filled 2017-03-12: qty 1

## 2017-03-12 MED ORDER — HYDROCODONE-ACETAMINOPHEN 7.5-325 MG PO TABS
1.0000 | ORAL_TABLET | ORAL | Status: DC | PRN
  Administered 2017-03-12: 2 via ORAL
  Administered 2017-03-12: 1 via ORAL
  Administered 2017-03-12 – 2017-03-14 (×7): 2 via ORAL
  Filled 2017-03-12 (×6): qty 2
  Filled 2017-03-12: qty 1
  Filled 2017-03-12 (×3): qty 2

## 2017-03-12 MED ORDER — TRAZODONE HCL 50 MG PO TABS
50.0000 mg | ORAL_TABLET | Freq: Every evening | ORAL | Status: DC | PRN
  Administered 2017-03-12 – 2017-03-14 (×3): 50 mg via ORAL
  Filled 2017-03-12 (×3): qty 1

## 2017-03-12 MED ORDER — HYDROMORPHONE HCL 1 MG/ML IJ SOLN
1.0000 mg | INTRAMUSCULAR | Status: DC | PRN
  Administered 2017-03-12 – 2017-03-14 (×9): 1 mg via INTRAVENOUS
  Filled 2017-03-12 (×9): qty 1

## 2017-03-12 MED ORDER — IPRATROPIUM-ALBUTEROL 20-100 MCG/ACT IN AERS
1.0000 | INHALATION_SPRAY | Freq: Four times a day (QID) | RESPIRATORY_TRACT | Status: DC | PRN
  Filled 2017-03-12: qty 4

## 2017-03-12 MED ORDER — FENTANYL CITRATE (PF) 100 MCG/2ML IJ SOLN
INTRAMUSCULAR | Status: AC
  Filled 2017-03-12: qty 2

## 2017-03-12 MED ORDER — OXYCODONE HCL 5 MG/5ML PO SOLN
5.0000 mg | Freq: Once | ORAL | Status: DC | PRN
  Filled 2017-03-12: qty 5

## 2017-03-12 MED ORDER — ONDANSETRON HCL 4 MG/2ML IJ SOLN
4.0000 mg | Freq: Four times a day (QID) | INTRAMUSCULAR | Status: DC | PRN
  Administered 2017-03-12: 4 mg via INTRAVENOUS
  Filled 2017-03-12 (×2): qty 2

## 2017-03-12 MED ORDER — METHOCARBAMOL 500 MG PO TABS
500.0000 mg | ORAL_TABLET | Freq: Four times a day (QID) | ORAL | Status: DC | PRN
  Administered 2017-03-12 – 2017-03-15 (×4): 500 mg via ORAL
  Filled 2017-03-12 (×4): qty 1

## 2017-03-12 MED ORDER — AMITRIPTYLINE HCL 50 MG PO TABS
50.0000 mg | ORAL_TABLET | Freq: Every day | ORAL | Status: DC
  Administered 2017-03-12 – 2017-03-14 (×3): 50 mg via ORAL
  Filled 2017-03-12 (×3): qty 1

## 2017-03-12 MED ORDER — DULAGLUTIDE 1.5 MG/0.5ML ~~LOC~~ SOAJ: 1.5000 mg | SUBCUTANEOUS | Status: DC

## 2017-03-12 MED ORDER — METHOCARBAMOL 1000 MG/10ML IJ SOLN
500.0000 mg | Freq: Four times a day (QID) | INTRAVENOUS | Status: DC | PRN
  Administered 2017-03-13: 500 mg via INTRAVENOUS
  Filled 2017-03-12: qty 550
  Filled 2017-03-12: qty 5

## 2017-03-12 MED ORDER — ATORVASTATIN CALCIUM 20 MG PO TABS
20.0000 mg | ORAL_TABLET | Freq: Every day | ORAL | Status: DC
  Administered 2017-03-13 – 2017-03-14 (×2): 20 mg via ORAL
  Filled 2017-03-12 (×3): qty 1

## 2017-03-12 MED ORDER — PHENYLEPHRINE HCL 10 MG/ML IJ SOLN
INTRAMUSCULAR | Status: DC | PRN
  Administered 2017-03-12: 50 ug/min via INTRAVENOUS

## 2017-03-12 MED ORDER — PROPOFOL 10 MG/ML IV BOLUS
INTRAVENOUS | Status: AC
  Filled 2017-03-12: qty 20

## 2017-03-12 MED ORDER — PROPOFOL 10 MG/ML IV BOLUS
INTRAVENOUS | Status: DC | PRN
  Administered 2017-03-12: 20 mg via INTRAVENOUS

## 2017-03-12 MED ORDER — RIVAROXABAN 10 MG PO TABS
10.0000 mg | ORAL_TABLET | Freq: Every day | ORAL | Status: DC
  Administered 2017-03-13 – 2017-03-15 (×3): 10 mg via ORAL
  Filled 2017-03-12 (×3): qty 1

## 2017-03-12 MED ORDER — CHLORHEXIDINE GLUCONATE 4 % EX LIQD: 60.0000 mL | Freq: Once | CUTANEOUS | Status: DC

## 2017-03-12 MED ORDER — MIDAZOLAM HCL 2 MG/2ML IJ SOLN
INTRAMUSCULAR | Status: AC
  Filled 2017-03-12: qty 2

## 2017-03-12 MED ORDER — CLONIDINE HCL 0.1 MG PO TABS
0.3000 mg | ORAL_TABLET | Freq: Every day | ORAL | Status: DC
  Administered 2017-03-13 – 2017-03-15 (×3): 0.3 mg via ORAL
  Filled 2017-03-12 (×4): qty 3

## 2017-03-12 MED ORDER — TRANEXAMIC ACID 1000 MG/10ML IV SOLN
1000.0000 mg | INTRAVENOUS | Status: AC
  Administered 2017-03-12: 1000 mg via INTRAVENOUS
  Filled 2017-03-12: qty 1100

## 2017-03-12 MED ORDER — MIDAZOLAM HCL 5 MG/5ML IJ SOLN
INTRAMUSCULAR | Status: DC | PRN
  Administered 2017-03-12 (×2): 1 mg via INTRAVENOUS

## 2017-03-12 MED ORDER — OXYCODONE HCL 5 MG PO TABS: 5.0000 mg | ORAL_TABLET | Freq: Once | ORAL | Status: DC | PRN

## 2017-03-12 MED ORDER — ACETAMINOPHEN 325 MG PO TABS
650.0000 mg | ORAL_TABLET | ORAL | Status: DC | PRN
  Administered 2017-03-15: 650 mg via ORAL
  Filled 2017-03-12: qty 2

## 2017-03-12 MED ORDER — PHENOL 1.4 % MT LIQD
1.0000 | OROMUCOSAL | Status: DC | PRN
  Filled 2017-03-12: qty 177

## 2017-03-12 MED ORDER — DEXAMETHASONE SODIUM PHOSPHATE 10 MG/ML IJ SOLN
INTRAMUSCULAR | Status: DC | PRN
Start: 2017-03-12 — End: 2017-03-12
  Administered 2017-03-12: 10 mg via INTRAVENOUS

## 2017-03-12 MED ORDER — LOSARTAN POTASSIUM 50 MG PO TABS
50.0000 mg | ORAL_TABLET | Freq: Two times a day (BID) | ORAL | Status: DC
  Administered 2017-03-12 – 2017-03-15 (×6): 50 mg via ORAL
  Filled 2017-03-12 (×6): qty 1

## 2017-03-12 MED ORDER — METOCLOPRAMIDE HCL 5 MG PO TABS: 5.0000 mg | ORAL_TABLET | Freq: Three times a day (TID) | ORAL | Status: DC | PRN

## 2017-03-12 MED ORDER — BUPIVACAINE IN DEXTROSE 0.75-8.25 % IT SOLN
INTRATHECAL | Status: DC | PRN
  Administered 2017-03-12: 2 mL via INTRATHECAL

## 2017-03-12 MED ORDER — EPHEDRINE 5 MG/ML INJ
INTRAVENOUS | Status: AC
  Filled 2017-03-12: qty 10

## 2017-03-12 MED ORDER — METOCLOPRAMIDE HCL 5 MG/ML IJ SOLN: 5.0000 mg | Freq: Three times a day (TID) | INTRAMUSCULAR | Status: DC | PRN

## 2017-03-12 MED ORDER — LIDOCAINE 2% (20 MG/ML) 5 ML SYRINGE
INTRAMUSCULAR | Status: AC
Start: 2017-03-12 — End: 2017-03-12
  Filled 2017-03-12: qty 5

## 2017-03-12 MED ORDER — DEXAMETHASONE SODIUM PHOSPHATE 10 MG/ML IJ SOLN
INTRAMUSCULAR | Status: AC
  Filled 2017-03-12: qty 1

## 2017-03-12 MED ORDER — EPHEDRINE SULFATE-NACL 50-0.9 MG/10ML-% IV SOSY
PREFILLED_SYRINGE | INTRAVENOUS | Status: DC | PRN
  Administered 2017-03-12 (×2): 10 mg via INTRAVENOUS

## 2017-03-12 MED ORDER — FENTANYL CITRATE (PF) 100 MCG/2ML IJ SOLN
25.0000 ug | INTRAMUSCULAR | Status: DC | PRN
Start: 2017-03-12 — End: 2017-03-12

## 2017-03-12 MED ORDER — FENTANYL CITRATE (PF) 100 MCG/2ML IJ SOLN
INTRAMUSCULAR | Status: DC | PRN
  Administered 2017-03-12 (×2): 50 ug via INTRAVENOUS

## 2017-03-12 SURGICAL SUPPLY — 40 items
BAG ZIPLOCK 12X15 (MISCELLANEOUS) IMPLANT
BANDAGE ACE 6X5 VEL STRL LF (GAUZE/BANDAGES/DRESSINGS) ×4 IMPLANT
BENZOIN TINCTURE PRP APPL 2/3 (GAUZE/BANDAGES/DRESSINGS) ×4 IMPLANT
BLADE SAG 18X100X1.27 (BLADE) ×2 IMPLANT
CAPT KNEE TRIATH TK-4 ×2 IMPLANT
COVER SURGICAL LIGHT HANDLE (MISCELLANEOUS) ×2 IMPLANT
CUFF TOURN SGL QUICK 44 (TOURNIQUET CUFF) ×2 IMPLANT
DRAPE U-SHAPE 47X51 STRL (DRAPES) ×2 IMPLANT
DURAPREP 26ML APPLICATOR (WOUND CARE) ×2 IMPLANT
ELECT REM PT RETURN 15FT ADLT (MISCELLANEOUS) ×2 IMPLANT
GAUZE SPONGE 4X4 12PLY STRL (GAUZE/BANDAGES/DRESSINGS) ×2 IMPLANT
GLOVE BIO SURGEON STRL SZ7.5 (GLOVE) ×2 IMPLANT
GLOVE BIOGEL PI IND STRL 7.5 (GLOVE) ×6 IMPLANT
GLOVE BIOGEL PI IND STRL 8 (GLOVE) ×2 IMPLANT
GLOVE BIOGEL PI INDICATOR 7.5 (GLOVE) ×6
GLOVE BIOGEL PI INDICATOR 8 (GLOVE) ×2
GLOVE ECLIPSE 8.0 STRL XLNG CF (GLOVE) ×2 IMPLANT
GLOVE SURG SS PI 7.5 STRL IVOR (GLOVE) ×4 IMPLANT
GOWN STRL REUS W/TWL XL LVL3 (GOWN DISPOSABLE) ×10 IMPLANT
HANDPIECE INTERPULSE COAX TIP (DISPOSABLE) ×1
HOVERMATT SINGLE USE (MISCELLANEOUS) ×2 IMPLANT
IMMOBILIZER KNEE 20 (SOFTGOODS) ×2
IMMOBILIZER KNEE 20 THIGH 36 (SOFTGOODS) ×1 IMPLANT
PACK TOTAL KNEE CUSTOM (KITS) ×2 IMPLANT
PAD ABD 8X10 STRL (GAUZE/BANDAGES/DRESSINGS) ×2 IMPLANT
PADDING CAST COTTON 6X4 STRL (CAST SUPPLIES) ×4 IMPLANT
POSITIONER SURGICAL ARM (MISCELLANEOUS) ×2 IMPLANT
SET HNDPC FAN SPRY TIP SCT (DISPOSABLE) ×1 IMPLANT
SET PAD KNEE POSITIONER (MISCELLANEOUS) ×2 IMPLANT
STRIP CLOSURE SKIN 1/2X4 (GAUZE/BANDAGES/DRESSINGS) ×4 IMPLANT
SUT MNCRL AB 4-0 PS2 18 (SUTURE) ×2 IMPLANT
SUT VIC AB 0 CT1 27 (SUTURE) ×1
SUT VIC AB 0 CT1 27XBRD ANTBC (SUTURE) ×1 IMPLANT
SUT VIC AB 1 CT1 27 (SUTURE) ×2
SUT VIC AB 1 CT1 27XBRD ANTBC (SUTURE) ×2 IMPLANT
SUT VIC AB 2-0 CT1 27 (SUTURE) ×2
SUT VIC AB 2-0 CT1 TAPERPNT 27 (SUTURE) ×2 IMPLANT
TRAY FOLEY CATH SILVER 14FR (SET/KITS/TRAYS/PACK) ×2 IMPLANT
WRAP KNEE MAXI GEL POST OP (GAUZE/BANDAGES/DRESSINGS) ×2 IMPLANT
YANKAUER SUCT BULB TIP 10FT TU (MISCELLANEOUS) ×2 IMPLANT

## 2017-03-12 NOTE — Anesthesia Procedure Notes (Signed)
Anesthesia Regional Block: Adductor canal block   Pre-Anesthetic Checklist: ,, timeout performed, Correct Patient, Correct Site, Correct Laterality, Correct Procedure, Correct Position, site marked, Risks and benefits discussed,  Surgical consent,  Pre-op evaluation,  At surgeon's request and post-op pain management  Laterality: Lower and Left  Prep: chloraprep       Needles:  Injection technique: Single-shot  Needle Type: Echogenic Stimulator Needle          Additional Needles:   Narrative:  Start time: 03/12/2017 7:17 AM End time: 03/12/2017 7:20 AM Injection made incrementally with aspirations every 5 mL.  Performed by: Personally  Anesthesiologist: Oleta Mouse, MD  Additional Notes: H+P and labs reviewed, risks and benefits discussed with patient, procedure tolerated well without complications

## 2017-03-12 NOTE — Brief Op Note (Signed)
03/12/2017  8:47 AM  PATIENT:  Tammy Strickland  56 y.o. female  PRE-OPERATIVE DIAGNOSIS:  osteoarthritis left knee  POST-OPERATIVE DIAGNOSIS:  osteoarthritis left knee  PROCEDURE:  Procedure(s) with comments: LEFT TOTAL KNEE ARTHROPLASTY (Left) - Adductor Block  SURGEON:  Surgeon(s) and Role:    Mcarthur Rossetti, MD - Primary  PHYSICIAN ASSISTANT: Benita Stabile, PA-C  ANESTHESIA:   regional and spinal  EBL:  50 mL   COUNTS:  YES  TOURNIQUET:   Total Tourniquet Time Documented: Thigh (Left) - 41 minutes Total: Thigh (Left) - 41 minutes    DICTATION: .Other Dictation: Dictation Number [id number]  PLAN OF CARE: Admit to inpatient   PATIENT DISPOSITION:  PACU - hemodynamically stable.   Delay start of Pharmacological VTE agent (>24hrs) due to surgical blood loss or risk of bleeding: no

## 2017-03-12 NOTE — Anesthesia Procedure Notes (Signed)
Spinal  Patient location during procedure: OR Start time: 03/12/2017 7:29 AM End time: 03/12/2017 7:33 AM Staffing Anesthesiologist: Oleta Mouse, MD Preanesthetic Checklist Completed: patient identified, surgical consent, pre-op evaluation, timeout performed, IV checked, risks and benefits discussed and monitors and equipment checked Spinal Block Patient position: sitting Prep: site prepped and draped and DuraPrep Patient monitoring: heart rate, cardiac monitor, continuous pulse ox and blood pressure Approach: midline Location: L4-5 Injection technique: single-shot Needle Needle type: [place]  Needle gauge: 24 G Needle length: 10 cm Assessment Sensory level: T6

## 2017-03-12 NOTE — H&P (Signed)
TOTAL KNEE ADMISSION H&P  Patient is being admitted for left total knee arthroplasty.  Subjective:  Chief Complaint:left knee pain.  HPI: Tammy Strickland, 56 y.o. female, has a history of pain and functional disability in the left knee due to arthritis and has failed non-surgical conservative treatments for greater than 12 weeks to includeNSAID's and/or analgesics, corticosteriod injections, viscosupplementation injections, flexibility and strengthening excercises, use of assistive devices, weight reduction as appropriate and activity modification.  Onset of symptoms was gradual, starting 3 years ago with gradually worsening course since that time. The patient noted no past surgery on the left knee(s).  Patient currently rates pain in the left knee(s) at 10 out of 10 with activity. Patient has night pain, worsening of pain with activity and weight bearing, pain that interferes with activities of daily living, pain with passive range of motion, crepitus and joint swelling.  Patient has evidence of subchondral sclerosis, periarticular osteophytes and joint space narrowing by imaging studies. There is no active infection.  Patient Active Problem List   Diagnosis Date Noted  . Status post total left knee replacement 03/12/2017  . Infected cyst of skin 02/26/2017  . Vulvar candidiasis 02/26/2017  . URI (upper respiratory infection) 02/26/2017  . Oral abscess 02/26/2017  . OSA (obstructive sleep apnea) 02/23/2017  . Acute gouty arthritis 12/25/2016  . Stroke ([place])   . Chronic fatigue 11/09/2016  . Hypoxia 11/09/2016  . Morbidly obese ([place]) 11/09/2016  . RLQ abdominal pain 07/21/2016  . Rash 07/21/2016  . Itching 07/21/2016  . Nonintractable headache 07/21/2016  . Dysuria 07/21/2016  . Allergic rhinitis 07/16/2016  . Vitamin D deficiency 04/16/2016  . Encounter for chronic pain management 03/12/2016  . Hyperlipidemia 01/15/2016  . Gait disorder 12/04/2015  . Diabetes ([place]) 11/26/2015  .  Chronic diastolic congestive heart failure ([place]) 11/06/2015  . TIA (transient ischemic attack)   . Carotid-cavernous fistula   . Chest pain 10/31/2015  . Insomnia 10/10/2015  . Depression 10/10/2015  . Benign essential HTN 08/15/2015  . Fibromyalgia 08/15/2015  . Peripheral neuropathy 08/15/2015  . CAD (coronary artery disease) 08/15/2015  . COPD (chronic obstructive pulmonary disease) ([place]) 08/15/2015  . Osteoporosis 08/15/2015  . 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 DDD (degenerative disc disease), lumbar 08/15/2015  . Occupational exposure to industrial toxins 08/15/2015  . GERD (gastroesophageal reflux disease) 08/15/2015   Past Medical History:  Diagnosis Date  . Anxiety   . Arthritis   . CHF (congestive heart failure) ([place])   . COPD (chronic obstructive pulmonary disease) ([place])   . Coronary artery disease   . Depression   . Diabetes mellitus without complication ([place])    type 2   . GERD (gastroesophageal reflux disease)   . Hypertension   . Lupus   . Neuromuscular disorder ([place])   . Osteoporosis   . Oxygen deficiency    pt uses 2.5L 02 at night   . Peripheral neuropathy   . Sleep apnea    had sleep study done recently ; unaware if she will be getting  a CPAP device ; patient states "im pretty sure i have it , i fall alseep all the time "  . Stroke [facility]) 10/2015    Past Surgical History:  Procedure Laterality Date  . ABDOMINAL HYSTERECTOMY     ovaries left  . APPENDECTOMY    . CHOLECYSTECTOMY    . IR GENERIC HISTORICAL  11/07/2015   IR ANGIO INTRA EXTRACRAN SEL COM CAROTID INNOMINATE BILAT MOD SED 11/07/2015 Luanne Bras,  MD [facility]  . IR GENERIC HISTORICAL  11/07/2015   IR ANGIO VERTEBRAL SEL SUBCLAVIAN INNOMINATE UNI R MOD SED 11/07/2015 Luanne Bras, MD [facility]  . IR GENERIC HISTORICAL  11/07/2015   IR ANGIO VERTEBRAL SEL VERTEBRAL UNI L MOD SED 11/07/2015 Luanne Bras, MD [facility]  . IR GENERIC HISTORICAL  11/07/2015   IR ANGIOGRAM  EXTREMITY LEFT 11/07/2015 Luanne Bras, MD [facility]  . TONSILLECTOMY      Current Facility-Administered Medications  Medication Dose Route Frequency Provider Last Rate Last Dose  . ceFAZolin (ANCEF) 3 g in dextrose 5 % 50 mL IVPB  3 g Intravenous On Call to [place], [place], [facility]      . chlorhexidine (HIBICLENS) 4 % liquid 4 application  60 mL Topical Once Erskine Emery W, PA-C      . tranexamic acid (CYKLOKAPRON) 1,000 mg in sodium chloride 0.9 % 100 mL IVPB  1,000 mg Intravenous To OR Pete Pelt, PA-C       No Known Allergies  Social History   Tobacco Use  . Smoking status: Current Every Day Smoker    Packs/day: 0.25    Years: 35.00    Pack years: 8.75    Types: Cigarettes  . Smokeless tobacco: Never Used  . Tobacco comment: referred  to smoking  cessation  classes. at  [facility]   Substance Use Topics  . Alcohol use: No    Alcohol/week: 1.2 - 1.8 oz    Types: 2 - 3 Standard drinks or equivalent per week    Family History  Problem Relation Age of Onset  . Hyperlipidemia Mother   . Hypertension Mother   . Hyperlipidemia Father   . Hypertension Father   . Stroke Father   . Hypertension Sister   . Cancer Sister        breast cancer  . Crohn's disease Sister      Review of Systems  Musculoskeletal: Positive for joint pain.  All other systems reviewed and are negative.   Objective:  Physical Exam  Constitutional: She is oriented to person, place, and time. She appears well-developed and well-nourished.  HENT:  Head: Normocephalic and atraumatic.  Eyes: EOM are normal. Pupils are equal, round, and reactive to light.  Neck: Normal range of motion. Neck supple.  Cardiovascular: Normal rate and regular rhythm.  Respiratory: Effort normal and breath sounds normal.  GI: Soft. Bowel sounds are normal.  Musculoskeletal:       Left knee: She exhibits decreased range of motion, effusion and abnormal alignment. Tenderness found. Medial joint line and lateral joint  line tenderness noted.  Neurological: She is alert and oriented to person, place, and time.  Skin: Skin is warm and dry.  Psychiatric: She has a normal mood and affect.    Vital signs in last 24 hours: Temp:  [98.2 F (36.8 C)] 98.2 F (36.8 C) (11/30 0515) Pulse Rate:  [68] 68 (11/30 0515) Resp:  [20] 20 (11/30 0515) BP: (104)/(77) 104/77 (11/30 0515) Weight:  [312 lb (141.5 kg)] 312 lb (141.5 kg) (11/30 0546)  Labs:   Estimated body mass index is 48.14 kg/m as calculated from the following:   Height as of this encounter: 5' 7.5" (1.715 m).   Weight as of this encounter: 312 lb (141.5 kg).   Imaging Review Plain radiographs demonstrate severe degenerative joint disease of the left knee(s). The overall alignment ismild varus. The bone quality appears to be good for age and reported activity level.  Assessment/Plan:  End stage arthritis, left knee   The patient history, physical examination, clinical judgment of the provider and imaging studies are consistent with end stage degenerative joint disease of the left knee(s) and total knee arthroplasty is deemed medically necessary. The treatment options including medical management, injection therapy arthroscopy and arthroplasty were discussed at length. The risks and benefits of total knee arthroplasty were presented and reviewed. The risks due to aseptic loosening, infection, stiffness, patella tracking problems, thromboembolic complications and other imponderables were discussed. The patient acknowledged the explanation, agreed to proceed with the plan and consent was signed. Patient is being admitted for inpatient treatment for surgery, pain control, PT, OT, prophylactic antibiotics, VTE prophylaxis, progressive ambulation and ADL's and discharge planning. The patient is planning to be discharged home with home health services

## 2017-03-12 NOTE — Anesthesia Postprocedure Evaluation (Signed)
Anesthesia Post Note  Patient: Tammy Strickland  Procedure(s) Performed: LEFT TOTAL KNEE ARTHROPLASTY (Left Knee)     Patient location during evaluation: PACU Anesthesia Type: Regional, Spinal and MAC Level of consciousness: awake and alert Pain management: pain level controlled Vital Signs Assessment: post-procedure vital signs reviewed and stable Respiratory status: spontaneous breathing, nonlabored ventilation, respiratory function stable and patient connected to nasal cannula oxygen Cardiovascular status: stable and blood pressure returned to baseline Postop Assessment: no apparent nausea or vomiting and spinal receding Anesthetic complications: no    Last Vitals:  Vitals:   03/12/17 1303 03/12/17 1407  BP: (!) 141/97 (!) 141/87  Pulse: 69 68  Resp: 15 16  Temp: 36.6 C 37.4 C  SpO2: 96% 98%    Last Pain:  Vitals:   03/12/17 1718  TempSrc:   PainSc: 10-Worst pain ever                 Carver Murakami

## 2017-03-12 NOTE — Transfer of Care (Signed)
Immediate Anesthesia Transfer of Care Note  Patient: Tammy Strickland  Procedure(s) Performed: LEFT TOTAL KNEE ARTHROPLASTY (Left Knee)  Patient Location: PACU  Anesthesia Type:Spinal  Level of Consciousness: awake, alert  and oriented  Airway & Oxygen Therapy: Patient Spontanous Breathing and Patient connected to face mask oxygen  Post-op Assessment: Report given to RN and Post -op Vital signs reviewed and stable  Post vital signs: Reviewed and stable  Last Vitals:  Vitals:   03/12/17 0515  BP: 104/77  Pulse: 68  Resp: 20  Temp: 36.8 C    Last Pain:  Vitals:   03/12/17 0546  TempSrc:   PainSc: 7       Patients Stated Pain Goal: 4 (29/57/47 3403)  Complications: No apparent anesthesia complications

## 2017-03-12 NOTE — Anesthesia Procedure Notes (Signed)
Date/Time: 03/12/2017 7:27 AM Performed by: Sharlette Dense, CRNA Oxygen Delivery Method: Simple face mask

## 2017-03-12 NOTE — Procedures (Signed)
Patient Name: Tammy Strickland, Tammy Strickland Date: 03/03/2017 Gender: Female D.O.B: 1961-03-09 Age (years): 43 Referring Provider: Kara Mead MD, ABSM Height (inches): 67 Interpreting Physician: Kara Mead MD, ABSM Weight (lbs): 298 RPSGT: Jorge Ny BMI: 28 MRN: [id number] Neck Size: 16.00   CLINICAL INFORMATION Sleep Study Type: NPSG  Indication for sleep study: Congestive Heart Failure, COPD, Obesity, OSA, Sleep walking/talking/parasomnias, Snoring  Epworth Sleepiness Score: 14  SLEEP STUDY TECHNIQUE As per the AASM Manual for the Scoring of Sleep and Associated Events v2.3 (April 2016) with a hypopnea requiring 4% desaturations.  The channels recorded and monitored were frontal, central and occipital EEG, electrooculogram (EOG), submentalis EMG (chin), nasal and oral airflow, thoracic and abdominal wall motion, anterior tibialis EMG, snore microphone, electrocardiogram, and pulse oximetry.  MEDICATIONS Medications self-administered by patient taken the night of the study : TRAZODONE, AMITRIPTYLINE, LOSARTAN, TIZANIDINE, GABAPENTIN  SLEEP ARCHITECTURE The study was initiated at 10:14:36 PM and ended at 5:14:21 AM.  Sleep onset time was 0.5 minutes and the sleep efficiency was 96.4%. The total sleep time was 404.5 minutes.  Stage REM latency was 295.0 minutes.  The patient spent 3.09% of the night in stage N1 sleep, 76.02% in stage N2 sleep, 14.83% in stage N3 and 6.06% in REM.  Alpha intrusion was absent.  Supine sleep was 48.34%.  RESPIRATORY PARAMETERS The overall apnea/hypopnea index (AHI) was 5.2 per hour. There were 1 total apneas, including 1 obstructive, 0 central and 0 mixed apneas. There were 34 hypopneas and 7 RERAs.  The AHI during Stage REM sleep was 49.0 per hour.  AHI while supine was 8.9 per hour.  The mean oxygen saturation was 92.53%. The minimum SpO2 during sleep was 82.00%.  loud snoring was noted during this study.  CARDIAC DATA The 2 lead  EKG demonstrated sinus rhythm. The mean heart rate was 68.50 beats per minute. Other EKG findings include: None.   LEG MOVEMENT DATA The total PLMS were 69 with a resulting PLMS index of 10.23. Associated arousal with leg movement index was 7.4 .  IMPRESSIONS - Mild obstructive sleep apnea occurred during this study (AHI = 5.2/h) , especially during supine ^& REM sleep - No significant central sleep apnea occurred during this study (CAI = 0.0/h). - Mild oxygen desaturation was noted during this study (Min O2 = 82.00%). - The patient snored with loud snoring volume. - No cardiac abnormalities were noted during this study. - Mild periodic limb movements of sleep occurred during the study. Associated arousals were significant.   DIAGNOSIS - Obstructive Sleep Apnea (327.23 [G47.33 ICD-10])   RECOMMENDATIONS - Positional therapy avoiding supine position during sleep. - Very mild obstructive sleep apnea. Return to discuss treatment options. - Avoid alcohol, sedatives and other CNS depressants that may worsen sleep apnea and disrupt normal sleep architecture. - Sleep hygiene should be reviewed to assess factors that may improve sleep quality. - Weight management and regular exercise should be initiated or continued if appropriate.   Kara Mead MD Board Certified in [place]

## 2017-03-12 NOTE — Evaluation (Signed)
Physical Therapy Evaluation Patient Details Name: Tammy Strickland MRN: [id number] DOB: 1961-01-22 Today's Date: 03/12/2017   History of Present Illness  Pt s/p L TKR and wtih hx of CVA, Lupus, DM, COPD, CHF   Clinical Impression  Pt s/p L TKR and presents with decreased L LE strength/ROM, post op pain and obesity limiting functional mobility.  Pt would benefit from follow up rehab at SNF level to maximize IND and safety prior to return home with ltd assist.    Follow Up Recommendations SNF    Equipment Recommendations  None recommended by PT    Recommendations for Other Services OT consult     Precautions / Restrictions Precautions Precautions: Fall;Knee Required Braces or Orthoses: Knee Immobilizer - Left Knee Immobilizer - Left: Discontinue once straight leg raise with < 10 degree lag Restrictions Weight Bearing Restrictions: No Other Position/Activity Restrictions: WBAT      Mobility  Bed Mobility Overal bed mobility: Needs Assistance Bed Mobility: Supine to Sit;Sit to Supine     Supine to sit: Mod assist;+2 for physical assistance;+2 for safety/equipment Sit to supine: Mod assist;+2 for physical assistance;+2 for safety/equipment   General bed mobility comments: cues for sequence and use of R LE to self assist.  Transfers Overall transfer level: Needs assistance Equipment used: Rolling walker (2 wheeled) Transfers: Sit to/from Stand Sit to Stand: Mod assist;+2 safety/equipment;+2 physical assistance;From elevated surface         General transfer comment: cues for LE management and use of UEs to self assist  Ambulation/Gait             General Gait Details: Pt stood only.  Gait not attempted 2* c/o dizziness "I think I'm going to pass out"  Stairs            Wheelchair Mobility    Modified Rankin (Stroke Patients Only)       Balance                                             Pertinent Vitals/Pain Pain Assessment:  Faces Faces Pain Scale: Hurts worst Pain Location: L knee Pain Descriptors / Indicators: Aching;Throbbing;Grimacing;Guarding;Moaning Pain Intervention(s): Limited activity within patient's tolerance;Monitored during session;Premedicated before session;Ice applied;Patient requesting pain meds-RN notified    Home Living Family/patient expects to be discharged to:: Skilled nursing facility Living Arrangements: Alone                    Prior Function Level of Independence: Independent               Hand Dominance        Extremity/Trunk Assessment   Upper Extremity Assessment Upper Extremity Assessment: Overall WFL for tasks assessed    Lower Extremity Assessment Lower Extremity Assessment: LLE deficits/detail    Cervical / Trunk Assessment Cervical / Trunk Assessment: Normal  Communication   Communication: No difficulties  Cognition Arousal/Alertness: Awake/alert Behavior During Therapy: WFL for tasks assessed/performed Overall Cognitive Status: Within Functional Limits for tasks assessed                                        General Comments      Exercises     Assessment/Plan    PT Assessment Patient needs continued PT services  PT Problem List  Decreased strength;Decreased range of motion;Decreased activity tolerance;Decreased mobility;Decreased knowledge of use of DME;Pain;Obesity       PT Treatment Interventions DME instruction;Gait training;Stair training;Functional mobility training;Therapeutic activities;Therapeutic exercise;Patient/family education    PT Goals (Current goals can be found in the Care Plan section)  Acute Rehab PT Goals Patient Stated Goal: I just want to stand up. PT Goal Formulation: With patient Time For Goal Achievement: 03/16/17 Potential to Achieve Goals: Fair    Frequency 7X/week   Barriers to discharge        Co-evaluation               AM-PAC PT "6 Clicks" Daily Activity  Outcome  Measure Difficulty turning over in bed (including adjusting bedclothes, sheets and blankets)?: Unable Difficulty moving from lying on back to sitting on the side of the bed? : Unable Difficulty sitting down on and standing up from a chair with arms (e.g., wheelchair, bedside commode, etc,.)?: Unable Help needed moving to and from a bed to chair (including a wheelchair)?: A Lot Help needed walking in hospital room?: Total Help needed climbing 3-5 steps with a railing? : Total 6 Click Score: 7    End of Session Equipment Utilized During Treatment: Gait belt;Left knee immobilizer Activity Tolerance: Patient limited by fatigue;Patient limited by pain Patient left: in bed;with call bell/phone within reach;with bed alarm set Nurse Communication: Mobility status PT Visit Diagnosis: Difficulty in walking, not elsewhere classified (R26.2);Pain Pain - Right/Left: Left Pain - part of body: Knee    Time: 7867-6720 PT Time Calculation (min) (ACUTE ONLY): 28 min   Charges:   PT Evaluation $PT Eval Low Complexity: 1 Low PT Treatments $Therapeutic Activity: 8-22 mins   PT G Codes:        Pg [phone]   Gustave Lindeman 03/12/2017, 6:35 PM

## 2017-03-12 NOTE — Anesthesia Preprocedure Evaluation (Signed)
Anesthesia Evaluation  Patient identified by MRN, date of birth, ID band Patient awake    Reviewed: Allergy & Precautions, NPO status , Patient's Chart, lab work & pertinent test results, reviewed documented beta blocker date and time   History of Anesthesia Complications Negative for: history of anesthetic complications  Airway Mallampati: III  TM Distance: >3 FB Neck ROM: Full    Dental  (+) Teeth Intact,    Pulmonary sleep apnea , COPD, Current Smoker,    breath sounds clear to auscultation       Cardiovascular hypertension, Pt. on medications and Pt. on home beta blockers + CAD, + Peripheral Vascular Disease and +CHF   Rhythm:Regular     Neuro/Psych  Headaches, PSYCHIATRIC DISORDERS Anxiety Depression TIA Neuromuscular disease    GI/Hepatic Neg liver ROS, GERD  Medicated and Controlled,  Endo/Other  diabetesMorbid obesity  Renal/GU      Musculoskeletal  (+) Arthritis , Fibromyalgia -  Abdominal   Peds  Hematology negative hematology ROS (+)   Anesthesia Other Findings   Reproductive/Obstetrics                             Anesthesia Physical Anesthesia Plan  ASA: III  Anesthesia Plan: MAC, Regional and Spinal   Post-op Pain Management:    Induction:   PONV Risk Score and Plan: 1 and Ondansetron  Airway Management Planned: Nasal Cannula  Additional Equipment: None  Intra-op Plan:   Post-operative Plan:   Informed Consent: I have reviewed the patients History and Physical, chart, labs and discussed the procedure including the risks, benefits and alternatives for the proposed anesthesia with the patient or authorized representative who has indicated his/her understanding and acceptance.   Dental advisory given  Plan Discussed with: CRNA and Surgeon  Anesthesia Plan Comments:         Anesthesia Quick Evaluation

## 2017-03-13 LAB — GLUCOSE, CAPILLARY
Glucose-Capillary: 173 mg/dL — ABNORMAL HIGH (ref 65–99)
Glucose-Capillary: 173 mg/dL — ABNORMAL HIGH (ref 65–99)
Glucose-Capillary: 158 mg/dL — ABNORMAL HIGH (ref 65–99)
Glucose-Capillary: 193 mg/dL — ABNORMAL HIGH (ref 65–99)

## 2017-03-13 LAB — BASIC METABOLIC PANEL
Anion gap: 11 (ref 5–15)
BUN: 11 mg/dL (ref 6–20)
CO2: 28 mmol/L (ref 22–32)
Calcium: 8.9 mg/dL (ref 8.9–10.3)
Chloride: 97 mmol/L — ABNORMAL LOW (ref 101–111)
Creatinine, Ser: 0.81 mg/dL (ref 0.44–1.00)
GFR calc Af Amer: >60 mL/min (ref >60)
GFR calc non Af Amer: >60 mL/min (ref >60)
Glucose, Bld: 189 mg/dL — ABNORMAL HIGH (ref 65–99)
Potassium: 4.6 mmol/L (ref 3.5–5.1)
Sodium: 136 mmol/L (ref 135–145)

## 2017-03-13 LAB — CBC
HCT: 36.6 % (ref 36.0–46.0)
Hemoglobin: 12.1 g/dL (ref 12.0–15.0)
MCH: 29.4 pg (ref 26.0–34.0)
MCHC: 33.1 g/dL (ref 30.0–36.0)
MCV: 89.1 fL (ref 78.0–100.0)
Platelets: 301 10*3/uL (ref 150–400)
RBC: 4.11 MIL/uL (ref 3.87–5.11)
RDW: 14.4 % (ref 11.5–15.5)
WBC: 18.7 10*3/uL — ABNORMAL HIGH (ref 4.0–10.5)

## 2017-03-13 NOTE — Progress Notes (Signed)
Subjective: 1 Day Post-Op Procedure(s) (LRB): LEFT TOTAL KNEE ARTHROPLASTY (Left) Patient reports pain as moderate.    Objective: Vital signs in last 24 hours: Temp:  [98 F (36.7 C)-99.3 F (37.4 C)] 98.3 F (36.8 C) (12/01 1000) Pulse Rate:  [61-88] 61 (12/01 1000) Resp:  [15-16] 16 (12/01 1000) BP: (141-184)/(81-116) 167/81 (12/01 1000) SpO2:  [95 %-100 %] 96 % (12/01 1000)  Intake/Output from previous day: 11/30 0701 - 12/01 0700 In: 4000 [P.O.:1540; I.V.:2300; IV Piggyback:160] Out: 6384 [Urine:3525; Blood:50] Intake/Output this shift: Total I/O In: 480 [P.O.:480] Out: 300 [Urine:300]  Recent Labs    03/13/17 0536  HGB 12.1   Recent Labs    03/13/17 0536  WBC 18.7*  RBC 4.11  HCT 36.6  PLT 301   Recent Labs    03/13/17 0536  NA 136  K 4.6  CL 97*  CO2 28  BUN 11  CREATININE 0.81  GLUCOSE 189*  CALCIUM 8.9   No results for input(s): LABPT, INR in the last 72 hours.  Sensation intact distally Intact pulses distally Dorsiflexion/Plantar flexion intact Incision: dressing C/D/I No cellulitis present Compartment soft  Assessment/Plan: 1 Day Post-Op Procedure(s) (LRB): LEFT TOTAL KNEE ARTHROPLASTY (Left) Up with therapy Discharge to SNF on Monday.  Mcarthur Rossetti 03/13/2017, 1:20 PM

## 2017-03-13 NOTE — Op Note (Signed)
NAMEDANNIELA, MCBREARTY              ACCOUNT NO.:  [id number]  MEDICAL RECORD NO.:  [id number]  LOCATION:  [facility]                         FACILITY:  [facility]  PHYSICIAN:  Lind Guest. Ninfa Linden, M.D.DATE OF BIRTH:  07/31/1960  DATE OF PROCEDURE:  03/12/2017 DATE OF DISCHARGE:                              OPERATIVE REPORT   PREOPERATIVE DIAGNOSIS:  Primary osteoarthritis and degenerative joint disease of left knee.  POSTOPERATIVE DIAGNOSIS:  Primary osteoarthritis and degenerative joint disease of left knee.  PROCEDURE:  Left total knee arthroplasty.  IMPLANTS:  Stryker triathlon press-fit knee system with size 3 press-fit femur, size 3 press-fit tibial tray, 11-mm fix-bearing polyethylene insert, size 29 press-fit patellar button.  SURGEON:  Mcarthur Rossetti, MD.  ASSISTANT:  Erskine Emery, PA-C.  ANESTHESIA: 1. Left lower extremity adductor canal block. 2. Spinal.  ANTIBIOTICS:  3 g IV Ancef.  BLOOD LOSS:  100 mL.  TOURNIQUET TIME:  Less than 1 hour.  COMPLICATIONS:  None.  INDICATION:  Ms. Stcharles is a 56 year old obese diabetic female, but under good diabetic control, and has lost significant amount of weight. She has severe known osteoarthritis and degenerative joint disease of her left knee.  Her knee is actually quite thin and her pain is daily and has detrimentally affected her activities of daily living, her quality of life, and her mobility.  At this point, she has tried and failed all forms of conservative treatment and does wish to proceed with a total knee arthroplasty and I felt comfortable doing this given her weight loss and her good diabetic control.  We had a long and thorough discussion about the surgery including discussion of risks and benefits. The risks include acute blood loss anemia, nerve and vessel injury, fracture, infection, and DVT, as well as hardware failure given her obesity.  She understands all these risks are high given her obesity  and diabetes.  She understands our goals are to decrease pain, improve mobility, and overall improve quality of life.  PROCEDURE DESCRIPTION:  After informed consent was obtained, appropriate left knee was marked.  Anesthesia was obtained with adductor canal block in holding.  She was then brought to the operating room and placed supine on the operating table.  She was sat up and then spinal anesthesia was obtained.  She was then laid in a supine position.  A Foley catheter was placed.  A nonsterile tourniquet was placed around her upper left thigh.  Her left leg was then prepped and draped from the thigh down the toes with DuraPrep and sterile drapes including sterile stockinette.  Time-out was called to identify correct patient and correct left knee.  We then used an Esmarch to wrap out the leg and tourniquet was inflated to 300 mmHg.  We then made a direct midline incision over the patella and carried this proximally and distally.  We dissected the knee joint and carried out a medial parapatellar arthrotomy finding a large joint effusion and significant arthritis throughout her knee.  We removed remnants of ACL, PCL, medial, and lateral meniscus as well as osteophytes around the knee.  With the knee in a flexed position, using extramedullary cutting guide, we set our slope at neutral  and corrected varus and valgus taking 9 mm off the high side.  We made this cut without difficulty.  We then used an intramedullary drill for the femur to make our distal femoral cut.  We set this cutting guide for 5 degrees, externally rotated for left knee for an 8-mm distal femoral cut, and made this cut without difficulty. We then brought the knee back down to full extension with a 9-mm extension block and achieved full extension.  We went back to the femur and removed all pins and then put our femoral sizing guide, based off the epicondylar axis.  This was for 3 degrees externally rotated for a left  knee.  We chose a size 3 femur.  We put a 4-in-1 cutting block for a size 3 femur, and made our anterior and posterior cuts followed by our chamfer cuts.  We then made our femoral box cut for a size 3 femur, and drilled our lugs for pegged press-fit femur.  We then went back to the tibia and chose a size 3 tibia for coverage of the tibial tray.  We made our keel punch off this and set our rotation off the tibia and femur. With the trial size 3 tibia and the trial size 3 femur, we trialed a 9- mm fix-bearing polyethylene insert, and we were pleased with stability, but I felt like she needed just a little bit of thicker poly due to her obesity and slight hyperextension.  We then made our patellar cut and drilled 3 holes for press-fit patellar button, size 29.  We then removed all instrumentation and made sure we removed all remnants of the meniscus from the back of the knee.  We then irrigated the knee with normal saline solution using pulsatile lavage.  We dried the knee thoroughly and then placed our press-fit size 3 tibia after preparing the tibia for a final press-fit part.  We did get a good press-fit on this and we placed a trial 3 press-fit femur and then a real fix bearing 11-mm polyethylene insert and press-fit our patellar button.  I was pleased with range of motion and stability after this.  We then irrigated the knee with normal saline solution using pulsatile lavage. We closed the arthrotomy with interrupted #1 Vicryl suture followed by 0 in the deep tissue, 2-0 Vicryl in the subcutaneous tissue, 4-0 Monocryl subcuticular stitch, and Steri-Strips on the skin.  Well-padded sterile dressing was applied.  She was taken to the recovery room in stable condition.  All final counts were correct.  There were no complications noted.     Lind Guest. Ninfa Linden, M.D.     CYB/MEDQ  D:  03/12/2017  T:  03/13/2017  Job:  [id number]

## 2017-03-13 NOTE — Progress Notes (Signed)
Physical Therapy Treatment Patient Details Name: Tammy Strickland MRN: [id number] DOB: [birth year] Today's Date: 03/13/2017    History of Present Illness Pt s/p L TKR and with hx of CVA, Lupus, DM, COPD, CHF     PT Comments    Pt with marked improvement in pain control this am and able to initiate therex program and ambulate short distance in hall.   Follow Up Recommendations  SNF     Equipment Recommendations  None recommended by PT    Recommendations for Other Services OT consult     Precautions / Restrictions Precautions Precautions: Fall;Knee Required Braces or Orthoses: Knee Immobilizer - Left Knee Immobilizer - Left: Discontinue once straight leg raise with < 10 degree lag Restrictions Weight Bearing Restrictions: No Other Position/Activity Restrictions: WBAT    Mobility  Bed Mobility               General bed mobility comments: Pt up in chair and requests back to same  Transfers Overall transfer level: Needs assistance Equipment used: Rolling walker (2 wheeled) Transfers: Sit to/from Stand Sit to Stand: Min assist;+2 physical assistance;+2 safety/equipment         General transfer comment: cues for LE management and use of UEs to self assist  Ambulation/Gait Ambulation/Gait assistance: Min assist;+2 physical assistance;+2 safety/equipment Ambulation Distance (Feet): 32 Feet Assistive device: Rolling walker (2 wheeled) Gait Pattern/deviations: Step-to pattern;Decreased step length - right;Decreased step length - left;Shuffle;Trunk flexed Gait velocity: decr Gait velocity interpretation: Below normal speed for age/gender General Gait Details: cues for sequence, posture, position from RW and for basic safety awareness   Stairs            Wheelchair Mobility    Modified Rankin (Stroke Patients Only)       Balance Overall balance assessment: Needs assistance Sitting-balance support: No upper extremity supported;Feet supported Sitting  balance-Leahy Scale: Good     Standing balance support: Bilateral upper extremity supported Standing balance-Leahy Scale: Poor                              Cognition Arousal/Alertness: Awake/alert Behavior During Therapy: WFL for tasks assessed/performed Overall Cognitive Status: Within Functional Limits for tasks assessed                                        Exercises Total Joint Exercises Ankle Circles/Pumps: AROM;Both;15 reps;Supine Quad Sets: AROM;Left;10 reps;Supine Heel Slides: AAROM;Left;15 reps;Supine Hip ABduction/ADduction: AAROM;Left;10 reps;Supine Goniometric ROM: AAROM L knee -10 - 40    General Comments        Pertinent Vitals/Pain Pain Assessment: Faces Faces Pain Scale: Hurts little more Pain Location: L knee Pain Descriptors / Indicators: Aching;Guarding;Sore Pain Intervention(s): Limited activity within patient's tolerance;Monitored during session;Premedicated before session;Ice applied    Home Living                      Prior Function            PT Goals (current goals can now be found in the care plan section) Acute Rehab PT Goals Patient Stated Goal: Regain IND PT Goal Formulation: With patient Time For Goal Achievement: 03/16/17 Potential to Achieve Goals: Fair Progress towards PT goals: Progressing toward goals    Frequency    7X/week      PT Plan Current plan remains appropriate  Co-evaluation              AM-PAC PT "6 Clicks" Daily Activity  Outcome Measure  Difficulty turning over in bed (including adjusting bedclothes, sheets and blankets)?: Unable Difficulty moving from lying on back to sitting on the side of the bed? : Unable Difficulty sitting down on and standing up from a chair with arms (e.g., wheelchair, bedside commode, etc,.)?: Unable Help needed moving to and from a bed to chair (including a wheelchair)?: A Lot Help needed walking in hospital room?: A Lot Help needed  climbing 3-5 steps with a railing? : A Lot 6 Click Score: 9    End of Session Equipment Utilized During Treatment: Gait belt;Left knee immobilizer Activity Tolerance: Patient tolerated treatment well Patient left: in chair;with call bell/phone within reach Nurse Communication: Mobility status PT Visit Diagnosis: Difficulty in walking, not elsewhere classified (R26.2);Pain Pain - Right/Left: Left Pain - part of body: Knee     Time: 1050-1115 PT Time Calculation (min) (ACUTE ONLY): 25 min  Charges:  $Gait Training: 8-22 mins $Therapeutic Exercise: 8-22 mins                    G Codes:       Pg [phone]    Shelbee Apgar 03/13/2017, 2:23 PM

## 2017-03-13 NOTE — Progress Notes (Signed)
OT Cancellation Note  Patient Details Name: Geanine Vandekamp MRN: [id number] DOB: 1960/11/22   Cancelled Treatment:    Reason Eval/Treat Not Completed: Other (comment) Patient lives alone and the discharge plan is SNF. Currently requiring +2 assistance with basic mobility tasks. Will defer OT needs to SNF.   Lawrence Roldan A Kynnedi Zweig 03/13/2017, 12:22 PM

## 2017-03-13 NOTE — Discharge Instructions (Signed)
Information on my medicine - XARELTO® (Rivaroxaban) ° ° °Why was Xarelto® prescribed for you? °Xarelto® was prescribed for you to reduce the risk of blood clots forming after orthopedic surgery. The medical term for these abnormal blood clots is venous thromboembolism (VTE). ° °What do you need to know about xarelto® ? °Take your Xarelto® ONCE DAILY at the same time every day. °You may take it either with or without food. ° °If you have difficulty swallowing the tablet whole, you may crush it and mix in applesauce just prior to taking your dose. ° °Take Xarelto® exactly as prescribed by your doctor and DO NOT stop taking Xarelto® without talking to the doctor who prescribed the medication.  Stopping without other VTE prevention medication to take the place of Xarelto® may increase your risk of developing a clot. ° °After discharge, you should have regular check-up appointments with your healthcare provider that is prescribing your Xarelto®.   ° °What do you do if you miss a dose? °If you miss a dose, take it as soon as you remember on the same day then continue your regularly scheduled once daily regimen the next day. Do not take two doses of Xarelto® on the same day.  ° °Important Safety Information °A possible side effect of Xarelto® is bleeding. You should call your healthcare provider right away if you experience any of the following: °? Bleeding from an injury or your nose that does not stop. °? Unusual colored urine (red or dark brown) or unusual colored stools (red or black). °? Unusual bruising for unknown reasons. °? A serious fall or if you hit your head (even if there is no bleeding). ° °Some medicines may interact with Xarelto® and might increase your risk of bleeding while on Xarelto®. To help avoid this, consult your healthcare provider or pharmacist prior to using any new prescription or non-prescription medications, including herbals, vitamins, non-steroidal anti-inflammatory drugs (NSAIDs) and  supplements. ° °This website has more information on Xarelto®: www.xarelto.com. ° °INSTRUCTIONS AFTER JOINT REPLACEMENT  ° °o Remove items at home which could result in a fall. This includes throw rugs or furniture in walking pathways °o ICE to the affected joint every three hours while awake for 30 minutes at a time, for at least the first 3-5 days, and then as needed for pain and swelling.  Continue to use ice for pain and swelling. You may notice swelling that will progress down to the foot and ankle.  This is normal after surgery.  Elevate your leg when you are not up walking on it.   °o Continue to use the breathing machine you got in the hospital (incentive spirometer) which will help keep your temperature down.  It is common for your temperature to cycle up and down following surgery, especially at night when you are not up moving around and exerting yourself.  The breathing machine keeps your lungs expanded and your temperature down. ° ° °DIET:  As you were doing prior to hospitalization, we recommend a well-balanced diet. ° °DRESSING / WOUND CARE / SHOWERING ° °Keep the surgical dressing until follow up.  The dressing is water proof, so you can shower without any extra covering.  IF THE DRESSING FALLS OFF or the wound gets wet inside, change the dressing with sterile gauze.  Please use good hand washing techniques before changing the dressing.  Do not use any lotions or creams on the incision until instructed by your surgeon.   ° °ACTIVITY ° °o Increase activity   slowly as tolerated, but follow the weight bearing instructions below.   °o No driving for 6 weeks or until further direction given by your physician.  You cannot drive while taking narcotics.  °o No lifting or carrying greater than 10 lbs. until further directed by your surgeon. °o Avoid periods of inactivity such as sitting longer than an hour when not asleep. This helps prevent blood clots.  °o You may return to work once you are authorized by  your doctor.  ° ° ° °WEIGHT BEARING  ° °Weight bearing as tolerated with assist device (walker, cane, etc) as directed, use it as long as suggested by your surgeon or therapist, typically at least 4-6 weeks. ° ° °EXERCISES ° °Results after joint replacement surgery are often greatly improved when you follow the exercise, range of motion and muscle strengthening exercises prescribed by your doctor. Safety measures are also important to protect the joint from further injury. Any time any of these exercises cause you to have increased pain or swelling, decrease what you are doing until you are comfortable again and then slowly increase them. If you have problems or questions, call your caregiver or physical therapist for advice.  ° °Rehabilitation is important following a joint replacement. After just a few days of immobilization, the muscles of the leg can become weakened and shrink (atrophy).  These exercises are designed to build up the tone and strength of the thigh and leg muscles and to improve motion. Often times heat used for twenty to thirty minutes before working out will loosen up your tissues and help with improving the range of motion but do not use heat for the first two weeks following surgery (sometimes heat can increase post-operative swelling).  ° °These exercises can be done on a training (exercise) mat, on the floor, on a table or on a bed. Use whatever works the best and is most comfortable for you.    Use music or television while you are exercising so that the exercises are a pleasant break in your day. This will make your life better with the exercises acting as a break in your routine that you can look forward to.   Perform all exercises about fifteen times, three times per day or as directed.  You should exercise both the operative leg and the other leg as well. ° °Exercises include: °  °• Quad Sets - Tighten up the muscle on the front of the thigh (Quad) and hold for 5-10 seconds.    °• Straight Leg Raises - With your knee straight (if you were given a brace, keep it on), lift the leg to 60 degrees, hold for 3 seconds, and slowly lower the leg.  Perform this exercise against resistance later as your leg gets stronger.  °• Leg Slides: Lying on your back, slowly slide your foot toward your buttocks, bending your knee up off the floor (only go as far as is comfortable). Then slowly slide your foot back down until your leg is flat on the floor again.  °• Angel Wings: Lying on your back spread your legs to the side as far apart as you can without causing discomfort.  °• Hamstring Strength:  Lying on your back, push your heel against the floor with your leg straight by tightening up the muscles of your buttocks.  Repeat, but this time bend your knee to a comfortable angle, and push your heel against the floor.  You may put a pillow under the heel to make   it more comfortable if necessary.  ° °A rehabilitation program following joint replacement surgery can speed recovery and prevent re-injury in the future due to weakened muscles. Contact your doctor or a physical therapist for more information on knee rehabilitation.  ° ° °CONSTIPATION ° °Constipation is defined medically as fewer than three stools per week and severe constipation as less than one stool per week.  Even if you have a regular bowel pattern at home, your normal regimen is likely to be disrupted due to multiple reasons following surgery.  Combination of anesthesia, postoperative narcotics, change in appetite and fluid intake all can affect your bowels.  ° °YOU MUST use at least one of the following options; they are listed in order of increasing strength to get the job done.  They are all available over the counter, and you may need to use some, POSSIBLY even all of these options:   ° °Drink plenty of fluids (prune juice may be helpful) and high fiber foods °Colace 100 mg by mouth twice a day  °Senokot for constipation as directed and as  needed Dulcolax (bisacodyl), take with full glass of water  °Miralax (polyethylene glycol) once or twice a day as needed. ° °If you have tried all these things and are unable to have a bowel movement in the first 3-4 days after surgery call either your surgeon or your primary doctor.   ° °If you experience loose stools or diarrhea, hold the medications until you stool forms back up.  If your symptoms do not get better within 1 week or if they get worse, check with your doctor.  If you experience "the worst abdominal pain ever" or develop nausea or vomiting, please contact the office immediately for further recommendations for treatment. ° ° °ITCHING:  If you experience itching with your medications, try taking only a single pain pill, or even half a pain pill at a time.  You can also use Benadryl over the counter for itching or also to help with sleep.  ° °TED HOSE STOCKINGS:  Use stockings on both legs until for at least 2 weeks or as directed by physician office. They may be removed at night for sleeping. ° °MEDICATIONS:  See your medication summary on the “After Visit Summary” that nursing will review with you.  You may have some home medications which will be placed on hold until you complete the course of blood thinner medication.  It is important for you to complete the blood thinner medication as prescribed. ° °PRECAUTIONS:  If you experience chest pain or shortness of breath - call 911 immediately for transfer to the hospital emergency department.  ° °If you develop a fever greater that 101 F, purulent drainage from wound, increased redness or drainage from wound, foul odor from the wound/dressing, or calf pain - CONTACT YOUR SURGEON.   °                                                °FOLLOW-UP APPOINTMENTS:  If you do not already have a post-op appointment, please call the office for an appointment to be seen by your surgeon.  Guidelines for how soon to be seen are listed in your “After Visit Summary”, but  are typically between 1-4 weeks after surgery. ° °OTHER INSTRUCTIONS:  ° °Knee Replacement:  Do not place pillow under knee, focus   on keeping the knee straight while resting. CPM instructions: 0-90 degrees, 2 hours in the morning, 2 hours in the afternoon, and 2 hours in the evening. Place foam block, curve side up under heel at all times except when in CPM or when walking.  DO NOT modify, tear, cut, or change the foam block in any way. ° °MAKE SURE YOU:  °• Understand these instructions.  °• Get help right away if you are not doing well or get worse.  ° ° °Thank you for letting us be a part of your medical care team.  It is a privilege we respect greatly.  We hope these instructions will help you stay on track for a fast and full recovery!  ° °

## 2017-03-14 LAB — GLUCOSE, CAPILLARY
Glucose-Capillary: 143 mg/dL — ABNORMAL HIGH (ref 65–99)
Glucose-Capillary: 126 mg/dL — ABNORMAL HIGH (ref 65–99)
Glucose-Capillary: 151 mg/dL — ABNORMAL HIGH (ref 65–99)
Glucose-Capillary: 203 mg/dL — ABNORMAL HIGH (ref 65–99)

## 2017-03-14 MED ORDER — DULAGLUTIDE 1.5 MG/0.5ML ~~LOC~~ SOAJ
1.5000 mg | SUBCUTANEOUS | Status: DC
  Administered 2017-03-14: 1.5 mg via SUBCUTANEOUS

## 2017-03-14 NOTE — Social Work (Signed)
CSW contacted daughter Charlena Cross and discussed SNF plan and placement.  Daughter in agreement with SNF placement.  CSW will f/u with patient and daughter with SNF bed offers once received.  Elissa Hefty, LCSW Clinical Social Worker [phone]

## 2017-03-14 NOTE — Progress Notes (Signed)
Subjective: 2 Days Post-Op Procedure(s) (LRB): LEFT TOTAL KNEE ARTHROPLASTY (Left) Patient reports pain as moderate.    Objective: Vital signs in last 24 hours: Temp:  [98 F (36.7 C)-98.3 F (36.8 C)] 98 F (36.7 C) (12/02 0647) Pulse Rate:  [61-95] 87 (12/02 0647) Resp:  [16] 16 (12/02 0647) BP: (166-218)/(81-104) 166/88 (12/02 0647) SpO2:  [94 %-96 %] 95 % (12/02 0647)  Intake/Output from previous day: 12/01 0701 - 12/02 0700 In: 960 [P.O.:960] Out: 700 [Urine:700] Intake/Output this shift: No intake/output data recorded.  Recent Labs    03/13/17 0536  HGB 12.1   Recent Labs    03/13/17 0536  WBC 18.7*  RBC 4.11  HCT 36.6  PLT 301   Recent Labs    03/13/17 0536  NA 136  K 4.6  CL 97*  CO2 28  BUN 11  CREATININE 0.81  GLUCOSE 189*  CALCIUM 8.9   No results for input(s): LABPT, INR in the last 72 hours.  Neurologically intact No cellulitis present Compartment soft  Assessment/Plan: 2 Days Post-Op Procedure(s) (LRB): LEFT TOTAL KNEE ARTHROPLASTY (Left) Up with therapy Discharge to SNF Voiding without difficulty, No SOB or chest pain, dressing left knee clean and dry. No calf pain. Lives by herself and will need rehab placement-consult social services Garald Balding 03/14/2017, 9:41 AM

## 2017-03-14 NOTE — NC FL2 (Signed)
[place] LEVEL OF CARE SCREENING TOOL     IDENTIFICATION  Patient Name: Tammy Strickland Birthdate: [birth year] Sex: female Admission Date (Current Location): 03/12/2017  [facility] and [place] Number:  Herbalist and Address:  [facility], [place] [address] [place], [place] [address]      Provider Number: [id number]  Attending Physician Name and Address:  Mcarthur Rossetti  Relative Name and Phone Number:       Current Level of Care: Hospital Recommended Level of Care: [place] Prior Approval Number:    Date Approved/Denied:   PASRR Number: pending  Discharge Plan: SNF    Current Diagnoses: Patient Active Problem List   Diagnosis Date Noted  . Status post total left knee replacement 03/12/2017  . Infected cyst of skin 02/26/2017  . Vulvar candidiasis 02/26/2017  . URI (upper respiratory infection) 02/26/2017  . Oral abscess 02/26/2017  . OSA (obstructive sleep apnea) 02/23/2017  . Acute gouty arthritis 12/25/2016  . Stroke ([place])   . Chronic fatigue 11/09/2016  . Hypoxia 11/09/2016  . Morbidly obese ([place]) 11/09/2016  . RLQ abdominal pain 07/21/2016  . Rash 07/21/2016  . Itching 07/21/2016  . Nonintractable headache 07/21/2016  . Dysuria 07/21/2016  . Allergic rhinitis 07/16/2016  . Vitamin D deficiency 04/16/2016  . Encounter for chronic pain management 03/12/2016  . Hyperlipidemia 01/15/2016  . Gait disorder 12/04/2015  . Diabetes ([place]) 11/26/2015  . Chronic diastolic congestive heart failure ([place]) 11/06/2015  . TIA (transient ischemic attack)   . Carotid-cavernous fistula   . Chest pain 10/31/2015  . Insomnia 10/10/2015  . Depression 10/10/2015  . Benign essential HTN 08/15/2015  . Fibromyalgia 08/15/2015  . Peripheral neuropathy 08/15/2015  . CAD (coronary artery disease) 08/15/2015  . COPD (chronic obstructive pulmonary disease) ([place]) 08/15/2015  . Osteoporosis 08/15/2015  . 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 DDD (degenerative disc disease), lumbar 08/15/2015  . Occupational exposure to industrial toxins 08/15/2015  . GERD (gastroesophageal reflux disease) 08/15/2015    Orientation RESPIRATION BLADDER Height & Weight     Self, Time, Situation, Place  Normal Continent Weight: (!) 312 lb (141.5 kg) Height:  5' 7.5" (171.5 cm)  BEHAVIORAL SYMPTOMS/MOOD NEUROLOGICAL BOWEL NUTRITION STATUS      Continent Diet(See DC Summary)  AMBULATORY STATUS COMMUNICATION OF NEEDS Skin   Extensive Assist Verbally Surgical wounds                       Personal Care Assistance Level of Assistance  Dressing, Feeding, Bathing Bathing Assistance: Limited assistance Feeding assistance: Limited assistance Dressing Assistance: Limited assistance     Functional Limitations Info  Sight, Hearing, Speech Sight Info: Adequate Hearing Info: Adequate Speech Info: Adequate    SPECIAL CARE FACTORS FREQUENCY  PT (By licensed PT), OT (By licensed OT)     PT Frequency: 5x week OT Frequency: 5x week            Contractures Contractures Info: Not present    Additional Factors Info  Code Status, Allergies, Psychotropic Code Status Info: Full Code Allergies Info: No Known Allergies Psychotropic Info: Lexapro         Current Medications (03/14/2017):  This is the current hospital active medication list Current Facility-Administered Medications  Medication Dose Route Frequency Provider Last Rate Last Dose  . 0.9 %  sodium chloride infusion   Intravenous Continuous Mcarthur Rossetti, MD 75 mL/hr at 03/12/17 1557    .  acetaminophen (TYLENOL) tablet 650 mg  650 mg Oral Q4H PRN Mcarthur Rossetti, MD       Or  . acetaminophen (TYLENOL) suppository 650 mg  650 mg Rectal Q4H PRN Mcarthur Rossetti, MD      . alum & mag hydroxide-simeth (MAALOX/MYLANTA) 200-200-20 MG/5ML suspension 30 mL  30 mL Oral Q4H PRN Mcarthur Rossetti, MD      . amitriptyline (ELAVIL) tablet  50 mg  50 mg Oral QHS Mcarthur Rossetti, MD   50 mg at 03/13/17 2036  . atorvastatin (LIPITOR) tablet 20 mg  20 mg Oral q1800 Mcarthur Rossetti, MD   20 mg at 03/13/17 1830  . cloNIDine (CATAPRES) tablet 0.3 mg  0.3 mg Oral Daily Mcarthur Rossetti, MD   0.3 mg at 03/13/17 1020  . diphenhydrAMINE (BENADRYL) 12.5 MG/5ML elixir 12.5-25 mg  12.5-25 mg Oral Q4H PRN Mcarthur Rossetti, MD   25 mg at 03/12/17 1417  . Dulaglutide SOPN 1.5 mg  1.5 mg Subcutaneous Q Merrilee Jansky, Lind Guest, MD      . escitalopram [facility]) tablet 10 mg  10 mg Oral Daily Mcarthur Rossetti, MD   10 mg at 03/13/17 1020  . furosemide (LASIX) tablet 80 mg  80 mg Oral Daily Mcarthur Rossetti, MD   80 mg at 03/13/17 1020  . gabapentin (NEURONTIN) capsule 1,200 mg  1,200 mg Oral TID Mcarthur Rossetti, MD   1,200 mg at 03/14/17 0549  . HYDROcodone-acetaminophen (NORCO) 7.5-325 MG per tablet 1-2 tablet  1-2 tablet Oral Q4H PRN Mcarthur Rossetti, MD   2 tablet at 03/14/17 0805  . HYDROmorphone (DILAUDID) injection 1 mg  1 mg Intravenous Q2H PRN Mcarthur Rossetti, MD   1 mg at 03/14/17 3818  . ipratropium-albuterol (DUONEB) 0.5-2.5 (3) MG/3ML nebulizer solution 3 mL  3 mL Nebulization Q6H PRN Mcarthur Rossetti, MD      . losartan (COZAAR) tablet 50 mg  50 mg Oral BID Mcarthur Rossetti, MD   50 mg at 03/13/17 2035  . menthol-cetylpyridinium (CEPACOL) lozenge 3 mg  1 lozenge Oral PRN Mcarthur Rossetti, MD       Or  . phenol (CHLORASEPTIC) mouth spray 1 spray  1 spray Mouth/Throat PRN Mcarthur Rossetti, MD      . metFORMIN (GLUCOPHAGE-XR) 24 hr tablet 500 mg  500 mg Oral BID WC Mcarthur Rossetti, MD   500 mg at 03/14/17 0805  . methocarbamol (ROBAXIN) tablet 500 mg  500 mg Oral Q6H PRN Mcarthur Rossetti, MD   500 mg at 03/13/17 2228   Or  . methocarbamol (ROBAXIN) 500 mg in dextrose 5 % 50 mL IVPB  500 mg Intravenous Q6H PRN Mcarthur Rossetti, MD   Stopped at 03/13/17 0413  . metoCLOPramide (REGLAN) tablet 5-10 mg  5-10 mg Oral Q8H PRN Mcarthur Rossetti, MD       Or  . metoCLOPramide (REGLAN) injection 5-10 mg  5-10 mg Intravenous Q8H PRN Mcarthur Rossetti, MD      . metoprolol succinate (TOPROL-XL) 24 hr tablet 200 mg  200 mg Oral BID Mcarthur Rossetti, MD   200 mg at 03/13/17 2036  . nitroGLYCERIN (NITROSTAT) SL tablet 0.4 mg  0.4 mg Sublingual Q5 min PRN Mcarthur Rossetti, MD      . ondansetron [facility]) tablet 4 mg  4 mg Oral Q6H PRN Mcarthur Rossetti, MD       Or  .  ondansetron (ZOFRAN) injection 4 mg  4 mg Intravenous Q6H PRN Mcarthur Rossetti, MD   4 mg at 03/12/17 1552  . oxyCODONE (Oxy IR/ROXICODONE) immediate release tablet 10-15 mg  10-15 mg Oral Q3H PRN Mcarthur Rossetti, MD   15 mg at 03/12/17 1720  . potassium chloride SA (K-DUR,KLOR-CON) CR tablet 20 mEq  20 mEq Oral Daily Mcarthur Rossetti, MD   20 mEq at 03/13/17 1020  . rivaroxaban (XARELTO) tablet 10 mg  10 mg Oral Q breakfast Mcarthur Rossetti, MD   10 mg at 03/14/17 0805  . traZODone (DESYREL) tablet 50 mg  50 mg Oral QHS PRN Mcarthur Rossetti, MD   50 mg at 03/13/17 2036     Discharge Medications: Please see discharge summary for a list of discharge medications.  Relevant Imaging Results:  Relevant Lab Results:   Additional Information SS#: [id number]  Normajean Baxter, LCSW

## 2017-03-14 NOTE — NC FL2 (Signed)
[employer] LEVEL OF CARE SCREENING TOOL     IDENTIFICATION  Patient Name: Tammy Strickland Birthdate: November 04, 1960 Sex: female Admission Date (Current Location): 03/12/2017  [facility] and [place] Number:  Herbalist and Address:         Provider Number: [id number]  Attending Physician Name and Address:  Mcarthur Rossetti  Relative Name and Phone Number:  Peggye Pitt, daughter, [phone]    Current Level of Care: Domiciliary (Rest home) Recommended Level of Care:   Prior Approval Number:    Date Approved/Denied:   PASRR Number: pending  Discharge Plan: SNF    Current Diagnoses: Patient Active Problem List   Diagnosis Date Noted  . Status post total left knee replacement 03/12/2017  . Infected cyst of skin 02/26/2017  . Vulvar candidiasis 02/26/2017  . URI (upper respiratory infection) 02/26/2017  . Oral abscess 02/26/2017  . OSA (obstructive sleep apnea) 02/23/2017  . Acute gouty arthritis 12/25/2016  . Stroke ([place])   . Chronic fatigue 11/09/2016  . Hypoxia 11/09/2016  . Morbidly obese ([place]) 11/09/2016  . RLQ abdominal pain 07/21/2016  . Rash 07/21/2016  . Itching 07/21/2016  . Nonintractable headache 07/21/2016  . Dysuria 07/21/2016  . Allergic rhinitis 07/16/2016  . Vitamin D deficiency 04/16/2016  . Encounter for chronic pain management 03/12/2016  . Hyperlipidemia 01/15/2016  . Gait disorder 12/04/2015  . Diabetes ([place]) 11/26/2015  . Chronic diastolic congestive heart failure ([place]) 11/06/2015  . TIA (transient ischemic attack)   . Carotid-cavernous fistula   . Chest pain 10/31/2015  . Insomnia 10/10/2015  . Depression 10/10/2015  . Benign essential HTN 08/15/2015  . Fibromyalgia 08/15/2015  . Peripheral neuropathy 08/15/2015  . CAD (coronary artery disease) 08/15/2015  . COPD (chronic obstructive pulmonary disease) ([place]) 08/15/2015  . Osteoporosis 08/15/2015  . 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 DDD (degenerative  disc disease), lumbar 08/15/2015  . Occupational exposure to industrial toxins 08/15/2015  . GERD (gastroesophageal reflux disease) 08/15/2015    Orientation RESPIRATION BLADDER Height & Weight     Self, Time, Situation, Place  Normal Continent Weight: (!) 312 lb (141.5 kg) Height:  5' 7.5" (171.5 cm)  BEHAVIORAL SYMPTOMS/MOOD NEUROLOGICAL BOWEL NUTRITION STATUS      Continent Diet(See DC Summary)  AMBULATORY STATUS COMMUNICATION OF NEEDS Skin   Extensive Assist Verbally Surgical wounds                       Personal Care Assistance Level of Assistance  Dressing, Feeding, Bathing Bathing Assistance: Limited assistance Feeding assistance: Limited assistance Dressing Assistance: Limited assistance     Functional Limitations Info  Sight, Hearing, Speech Sight Info: Adequate Hearing Info: Adequate Speech Info: Adequate    SPECIAL CARE FACTORS FREQUENCY  PT (By licensed PT), OT (By licensed OT)     PT Frequency: 5x week OT Frequency: 5x week            Contractures Contractures Info: Not present    Additional Factors Info  Code Status, Allergies, Psychotropic Code Status Info: Full Code Allergies Info: No Known Allergies Psychotropic Info: Lexapro         Current Medications (03/14/2017):  This is the current hospital active medication list Current Facility-Administered Medications  Medication Dose Route Frequency Provider Last Rate Last Dose  . acetaminophen (TYLENOL) tablet 650 mg  650 mg Oral Q4H PRN Mcarthur Rossetti, MD       Or  . acetaminophen (TYLENOL) suppository 650 mg  650  mg Rectal Q4H PRN Mcarthur Rossetti, MD      . alum & mag hydroxide-simeth (MAALOX/MYLANTA) 200-200-20 MG/5ML suspension 30 mL  30 mL Oral Q4H PRN Mcarthur Rossetti, MD      . amitriptyline (ELAVIL) tablet 50 mg  50 mg Oral QHS Mcarthur Rossetti, MD   50 mg at 03/13/17 2036  . atorvastatin (LIPITOR) tablet 20 mg  20 mg Oral q1800 Mcarthur Rossetti,  MD   20 mg at 03/13/17 1830  . cloNIDine (CATAPRES) tablet 0.3 mg  0.3 mg Oral Daily Mcarthur Rossetti, MD   0.3 mg at 03/14/17 1002  . diphenhydrAMINE (BENADRYL) 12.5 MG/5ML elixir 12.5-25 mg  12.5-25 mg Oral Q4H PRN Mcarthur Rossetti, MD   25 mg at 03/12/17 1417  . Dulaglutide SOPN 1.5 mg  1.5 mg Subcutaneous Q Merrilee Jansky, Lind Guest, MD      . escitalopram [facility]) tablet 10 mg  10 mg Oral Daily Mcarthur Rossetti, MD   10 mg at 03/14/17 1002  . furosemide (LASIX) tablet 80 mg  80 mg Oral Daily Mcarthur Rossetti, MD   80 mg at 03/14/17 1002  . gabapentin (NEURONTIN) capsule 1,200 mg  1,200 mg Oral TID Mcarthur Rossetti, MD   1,200 mg at 03/14/17 0549  . HYDROcodone-acetaminophen (NORCO) 7.5-325 MG per tablet 1-2 tablet  1-2 tablet Oral Q4H PRN Mcarthur Rossetti, MD   2 tablet at 03/14/17 0805  . HYDROmorphone (DILAUDID) injection 1 mg  1 mg Intravenous Q2H PRN Mcarthur Rossetti, MD   1 mg at 03/14/17 1001  . ipratropium-albuterol (DUONEB) 0.5-2.5 (3) MG/3ML nebulizer solution 3 mL  3 mL Nebulization Q6H PRN Mcarthur Rossetti, MD      . losartan (COZAAR) tablet 50 mg  50 mg Oral BID Mcarthur Rossetti, MD   50 mg at 03/14/17 1002  . menthol-cetylpyridinium (CEPACOL) lozenge 3 mg  1 lozenge Oral PRN Mcarthur Rossetti, MD       Or  . phenol (CHLORASEPTIC) mouth spray 1 spray  1 spray Mouth/Throat PRN Mcarthur Rossetti, MD      . metFORMIN (GLUCOPHAGE-XR) 24 hr tablet 500 mg  500 mg Oral BID WC Mcarthur Rossetti, MD   500 mg at 03/14/17 0805  . methocarbamol (ROBAXIN) tablet 500 mg  500 mg Oral Q6H PRN Mcarthur Rossetti, MD   500 mg at 03/13/17 2228   Or  . methocarbamol (ROBAXIN) 500 mg in dextrose 5 % 50 mL IVPB  500 mg Intravenous Q6H PRN Mcarthur Rossetti, MD   Stopped at 03/13/17 0413  . metoCLOPramide (REGLAN) tablet 5-10 mg  5-10 mg Oral Q8H PRN Mcarthur Rossetti, MD       Or  . metoCLOPramide  (REGLAN) injection 5-10 mg  5-10 mg Intravenous Q8H PRN Mcarthur Rossetti, MD      . metoprolol succinate (TOPROL-XL) 24 hr tablet 200 mg  200 mg Oral BID Mcarthur Rossetti, MD   200 mg at 03/14/17 1001  . nitroGLYCERIN (NITROSTAT) SL tablet 0.4 mg  0.4 mg Sublingual Q5 min PRN Mcarthur Rossetti, MD      . ondansetron [facility]) tablet 4 mg  4 mg Oral Q6H PRN Mcarthur Rossetti, MD       Or  . ondansetron [facility]) injection 4 mg  4 mg Intravenous Q6H PRN Mcarthur Rossetti, MD   4 mg at 03/12/17 1552  . oxyCODONE (Oxy IR/ROXICODONE) immediate release tablet 10-15  mg  10-15 mg Oral Q3H PRN Mcarthur Rossetti, MD   15 mg at 03/12/17 1720  . potassium chloride SA (K-DUR,KLOR-CON) CR tablet 20 mEq  20 mEq Oral Daily Mcarthur Rossetti, MD   20 mEq at 03/14/17 1002  . rivaroxaban (XARELTO) tablet 10 mg  10 mg Oral Q breakfast Mcarthur Rossetti, MD   10 mg at 03/14/17 0805  . traZODone (DESYREL) tablet 50 mg  50 mg Oral QHS PRN Mcarthur Rossetti, MD   50 mg at 03/13/17 2036     Discharge Medications: Please see discharge summary for a list of discharge medications.  Relevant Imaging Results:  Relevant Lab Results:   Additional Information SS#: [id number]  Normajean Baxter, LCSW

## 2017-03-14 NOTE — Progress Notes (Signed)
Physical Therapy Treatment Patient Details Name: Tammy Strickland MRN: [id number] DOB: 03-12-1961 Today's Date: 03/14/2017    History of Present Illness Pt s/p L TKR and with hx of CVA, Lupus, DM, COPD, CHF     PT Comments    Progressing with mobility. Practiced exercises and gait training. Pt rated pain 7/10. Continue to recommend SNF.    Follow Up Recommendations  SNF     Equipment Recommendations  None recommended by PT    Recommendations for Other Services       Precautions / Restrictions Precautions Precautions: Fall;Knee Required Braces or Orthoses: Knee Immobilizer - Left Knee Immobilizer - Left: Discontinue once straight leg raise with < 10 degree lag Restrictions Weight Bearing Restrictions: No Other Position/Activity Restrictions: WBAT    Mobility  Bed Mobility               General bed mobility comments: oob in recliner  Transfers Overall transfer level: Needs assistance Equipment used: Rolling walker (2 wheeled) Transfers: Sit to/from Stand Sit to Stand: Min assist         General transfer comment: Assist to rise, stabilize, control descent. VCs safety, technique, hand/LE placement  Ambulation/Gait Ambulation/Gait assistance: Min assist Ambulation Distance (Feet): 100 Feet Assistive device: Rolling walker (2 wheeled) Gait Pattern/deviations: Step-to pattern;Decreased step length - right;Decreased step length - left;Trunk flexed     General Gait Details: Assist to stabilize and maneuver with RW intermittently. Slow gait speed. Pt fatigues fairly easily.   Stairs            Wheelchair Mobility    Modified Rankin (Stroke Patients Only)       Balance                                            Cognition Arousal/Alertness: Awake/alert Behavior During Therapy: WFL for tasks assessed/performed Overall Cognitive Status: Within Functional Limits for tasks assessed                                         Exercises Total Joint Exercises Ankle Circles/Pumps: AROM;Both;15 reps;Supine Quad Sets: AROM;Left;10 reps;Supine Heel Slides: AAROM;Left;15 reps;Supine Hip ABduction/ADduction: AAROM;Left;10 reps;Supine Straight Leg Raises: Left;10 reps;AAROM;Supine Goniometric ROM: ~10-55 degrees    General Comments        Pertinent Vitals/Pain Pain Assessment: 0-10 Pain Score: 7  Pain Location: L knee/thigh Pain Descriptors / Indicators: Aching;Guarding;Sore Pain Intervention(s): Monitored during session;Repositioned;Ice applied    Home Living                      Prior Function            PT Goals (current goals can now be found in the care plan section) Progress towards PT goals: Progressing toward goals    Frequency    7X/week      PT Plan Current plan remains appropriate    Co-evaluation              AM-PAC PT "6 Clicks" Daily Activity  Outcome Measure  Difficulty turning over in bed (including adjusting bedclothes, sheets and blankets)?: Unable Difficulty moving from lying on back to sitting on the side of the bed? : Unable Difficulty sitting down on and standing up from a chair with arms (e.g., wheelchair, bedside commode,  etc,.)?: Unable Help needed moving to and from a bed to chair (including a wheelchair)?: A Little Help needed walking in hospital room?: A Little Help needed climbing 3-5 steps with a railing? : A Lot 6 Click Score: 11    End of Session Equipment Utilized During Treatment: Gait belt;Left knee immobilizer Activity Tolerance: Patient tolerated treatment well Patient left: in chair;with call bell/phone within reach;with chair alarm set   PT Visit Diagnosis: Difficulty in walking, not elsewhere classified (R26.2);Pain Pain - Right/Left: Left Pain - part of body: Knee     Time: 0156-1537 PT Time Calculation (min) (ACUTE ONLY): 31 min  Charges:  $Gait Training: 8-22 mins $Therapeutic Exercise: 8-22 mins                     G Codes:          Weston Anna, MPT Pager: [phone]

## 2017-03-14 NOTE — Clinical Social Work Note (Signed)
Clinical Social Work Assessment  Patient Details  Name: Tammy Strickland MRN: [id number] Date of Birth: [birth year]  Date of referral:  03/14/17               Reason for consult:  Facility Placement                Permission sought to share information with:  Case Manager Permission granted to share information::  Yes, Verbal Permission Granted  Name::     Peggye Pitt  Agency::  SNF  Relationship::  daughter  Contact Information:     Housing/Transportation Living arrangements for the past 2 months:  Single Family Home Source of Information:  Patient Patient Interpreter Needed:  None Criminal Activity/Legal Involvement Pertinent to Current Situation/Hospitalization:  No - Comment as needed Significant Relationships:  Adult Children, Other Family Members Lives with:  Self Do you feel safe going back to the place where you live?  No Need for family participation in patient care:  No (Coment)  Care giving concerns:  Pt resides alone at home. Pt indicated tht she ambulated with rolling walker with chair prior to hospitalization. Pt will need SNF as she has no support at home.  Social Worker assessment / plan:  CSW met with patient at bedside to discuss disposition. Pt amenable to SNF as she has no one at home to assist. Pt gave permission for CSW to speak with daughter Charlena Cross. CSW obtained permission to send to local SNF's for bed offers. FL2 completed and sent to local [place] area. Passr pending as [facility] system is down until midnight.   Employment status:  Disabled (Comment on whether or not currently receiving Disability) Insurance information:  Managed Medicare PT Recommendations:  [place] / Referral to community resources:  [place]  Patient/Family's Response to care:  Patient appreciative of CSW assistance with SNf options and placement. CSW will continue to follow. No issues or concerns identified.  Patient/Family's  Understanding of and Emotional Response to Diagnosis, Current Treatment, and Prognosis:  Patient has good understanding of her diagnosis, current treatment and prognosis as evidenced by her agreement to SNF and acknowledging that she has no help at home given new impairment. No issues or concerns identified. CSW will assist with disposition.  Emotional Assessment Appearance:  Appears stated age Attitude/Demeanor/Rapport:  (Cooperative) Affect (typically observed):  Accepting, Appropriate Orientation:  Oriented to Situation, Oriented to  Time, Oriented to Place, Oriented to Self Alcohol / Substance use:  Not Applicable Psych involvement (Current and /or in the community):  No (Comment)  Discharge Needs  Concerns to be addressed:  Discharge Planning Concerns Readmission within the last 30 days:  No Current discharge risk:  Dependent with Mobility, Physical Impairment, Lives alone Barriers to Discharge:  No Barriers Identified   Normajean Baxter, LCSW 03/14/2017, 11:40 AM

## 2017-03-15 ENCOUNTER — Encounter (HOSPITAL_COMMUNITY): Payer: Self-pay | Admitting: Orthopaedic Surgery

## 2017-03-15 DIAGNOSIS — I2510 Atherosclerotic heart disease of native coronary artery without angina pectoris: Secondary | ICD-10-CM | POA: Diagnosis not present

## 2017-03-15 DIAGNOSIS — M129 Arthropathy, unspecified: Secondary | ICD-10-CM | POA: Diagnosis not present

## 2017-03-15 DIAGNOSIS — G8911 Acute pain due to trauma: Secondary | ICD-10-CM | POA: Diagnosis not present

## 2017-03-15 DIAGNOSIS — F17210 Nicotine dependence, cigarettes, uncomplicated: Secondary | ICD-10-CM | POA: Diagnosis not present

## 2017-03-15 DIAGNOSIS — I110 Hypertensive heart disease with heart failure: Secondary | ICD-10-CM | POA: Diagnosis not present

## 2017-03-15 DIAGNOSIS — R262 Difficulty in walking, not elsewhere classified: Secondary | ICD-10-CM | POA: Diagnosis not present

## 2017-03-15 DIAGNOSIS — Z4789 Encounter for other orthopedic aftercare: Secondary | ICD-10-CM | POA: Diagnosis not present

## 2017-03-15 DIAGNOSIS — G8929 Other chronic pain: Secondary | ICD-10-CM | POA: Diagnosis not present

## 2017-03-15 DIAGNOSIS — M25562 Pain in left knee: Secondary | ICD-10-CM | POA: Diagnosis not present

## 2017-03-15 DIAGNOSIS — M7989 Other specified soft tissue disorders: Secondary | ICD-10-CM | POA: Diagnosis not present

## 2017-03-15 DIAGNOSIS — M797 Fibromyalgia: Secondary | ICD-10-CM | POA: Diagnosis not present

## 2017-03-15 DIAGNOSIS — M25512 Pain in left shoulder: Secondary | ICD-10-CM | POA: Diagnosis not present

## 2017-03-15 DIAGNOSIS — I5040 Unspecified combined systolic (congestive) and diastolic (congestive) heart failure: Secondary | ICD-10-CM | POA: Diagnosis not present

## 2017-03-15 DIAGNOSIS — Z7982 Long term (current) use of aspirin: Secondary | ICD-10-CM | POA: Diagnosis not present

## 2017-03-15 DIAGNOSIS — M79642 Pain in left hand: Secondary | ICD-10-CM | POA: Diagnosis not present

## 2017-03-15 DIAGNOSIS — W19XXXA Unspecified fall, initial encounter: Secondary | ICD-10-CM | POA: Diagnosis not present

## 2017-03-15 DIAGNOSIS — R2689 Other abnormalities of gait and mobility: Secondary | ICD-10-CM | POA: Diagnosis not present

## 2017-03-15 DIAGNOSIS — E119 Type 2 diabetes mellitus without complications: Secondary | ICD-10-CM | POA: Diagnosis not present

## 2017-03-15 DIAGNOSIS — R2242 Localized swelling, mass and lump, left lower limb: Secondary | ICD-10-CM | POA: Diagnosis not present

## 2017-03-15 DIAGNOSIS — Z7901 Long term (current) use of anticoagulants: Secondary | ICD-10-CM | POA: Diagnosis not present

## 2017-03-15 DIAGNOSIS — Z8673 Personal history of transient ischemic attack (TIA), and cerebral infarction without residual deficits: Secondary | ICD-10-CM | POA: Diagnosis not present

## 2017-03-15 DIAGNOSIS — S8002XA Contusion of left knee, initial encounter: Secondary | ICD-10-CM | POA: Diagnosis not present

## 2017-03-15 DIAGNOSIS — J449 Chronic obstructive pulmonary disease, unspecified: Secondary | ICD-10-CM | POA: Diagnosis not present

## 2017-03-15 DIAGNOSIS — I509 Heart failure, unspecified: Secondary | ICD-10-CM | POA: Diagnosis not present

## 2017-03-15 DIAGNOSIS — M1380 Other specified arthritis, unspecified site: Secondary | ICD-10-CM | POA: Diagnosis not present

## 2017-03-15 DIAGNOSIS — R5381 Other malaise: Secondary | ICD-10-CM | POA: Diagnosis not present

## 2017-03-15 DIAGNOSIS — M25532 Pain in left wrist: Secondary | ICD-10-CM | POA: Diagnosis not present

## 2017-03-15 DIAGNOSIS — I10 Essential (primary) hypertension: Secondary | ICD-10-CM | POA: Diagnosis not present

## 2017-03-15 DIAGNOSIS — E1140 Type 2 diabetes mellitus with diabetic neuropathy, unspecified: Secondary | ICD-10-CM | POA: Diagnosis not present

## 2017-03-15 DIAGNOSIS — E7849 Other hyperlipidemia: Secondary | ICD-10-CM | POA: Diagnosis not present

## 2017-03-15 DIAGNOSIS — R609 Edema, unspecified: Secondary | ICD-10-CM | POA: Diagnosis not present

## 2017-03-15 DIAGNOSIS — Z7984 Long term (current) use of oral hypoglycemic drugs: Secondary | ICD-10-CM | POA: Diagnosis not present

## 2017-03-15 DIAGNOSIS — M6281 Muscle weakness (generalized): Secondary | ICD-10-CM | POA: Diagnosis not present

## 2017-03-15 DIAGNOSIS — G894 Chronic pain syndrome: Secondary | ICD-10-CM | POA: Diagnosis not present

## 2017-03-15 DIAGNOSIS — Z96698 Presence of other orthopedic joint implants: Secondary | ICD-10-CM | POA: Diagnosis not present

## 2017-03-15 DIAGNOSIS — K219 Gastro-esophageal reflux disease without esophagitis: Secondary | ICD-10-CM | POA: Diagnosis not present

## 2017-03-15 DIAGNOSIS — Z96652 Presence of left artificial knee joint: Secondary | ICD-10-CM | POA: Diagnosis not present

## 2017-03-15 DIAGNOSIS — R5382 Chronic fatigue, unspecified: Secondary | ICD-10-CM | POA: Diagnosis not present

## 2017-03-15 DIAGNOSIS — M810 Age-related osteoporosis without current pathological fracture: Secondary | ICD-10-CM | POA: Diagnosis not present

## 2017-03-15 LAB — GLUCOSE, CAPILLARY
Glucose-Capillary: 151 mg/dL — ABNORMAL HIGH (ref 65–99)
Glucose-Capillary: 164 mg/dL — ABNORMAL HIGH (ref 65–99)

## 2017-03-15 MED ORDER — OXYCODONE HCL 10 MG PO TABS: 10.0000 mg | ORAL_TABLET | ORAL | 0 refills | Status: DC | PRN

## 2017-03-15 MED ORDER — RIVAROXABAN 10 MG PO TABS: 10.0000 mg | ORAL_TABLET | Freq: Every day | ORAL | 0 refills | Status: DC

## 2017-03-15 MED ORDER — METHOCARBAMOL 500 MG PO TABS: 500.0000 mg | ORAL_TABLET | Freq: Four times a day (QID) | ORAL | 0 refills | Status: DC | PRN

## 2017-03-15 NOTE — Progress Notes (Signed)
Subjective: 3 Days Post-Op Procedure(s) (LRB): LEFT TOTAL KNEE ARTHROPLASTY (Left) Patient reports pain as moderate.    Objective: Vital signs in last 24 hours: Temp:  [98 F (36.7 C)-98.8 F (37.1 C)] 98.6 F (37 C) (12/03 0436) Pulse Rate:  [64-80] 75 (12/03 0436) Resp:  [14-19] 14 (12/03 0436) BP: (128-150)/(76-101) 128/76 (12/03 0436) SpO2:  [94 %-99 %] 94 % (12/03 0436)  Intake/Output from previous day: 12/02 0701 - 12/03 0700 In: 1440 [P.O.:1440] Out: -  Intake/Output this shift: No intake/output data recorded.  Recent Labs    03/13/17 0536  HGB 12.1   Recent Labs    03/13/17 0536  WBC 18.7*  RBC 4.11  HCT 36.6  PLT 301   Recent Labs    03/13/17 0536  NA 136  K 4.6  CL 97*  CO2 28  BUN 11  CREATININE 0.81  GLUCOSE 189*  CALCIUM 8.9   No results for input(s): LABPT, INR in the last 72 hours.  Sensation intact distally Intact pulses distally Dorsiflexion/Plantar flexion intact Incision: scant drainage No cellulitis present Compartment soft  Assessment/Plan: 3 Days Post-Op Procedure(s) (LRB): LEFT TOTAL KNEE ARTHROPLASTY (Left) Up with therapy Discharge to SNF today.  Mcarthur Rossetti 03/15/2017, 7:18 AM

## 2017-03-15 NOTE — Care Management Important Message (Signed)
Important Message  Patient Details  Name: Tammy Strickland MRN: [id number] Date of Birth: 07/27/1960   Medicare Important Message Given:  Yes    Kerin Salen 03/15/2017, 10:39 AMImportant Message  Patient Details  Name: Tammy Strickland MRN: [id number] Date of Birth: [birth year]   Medicare Important Message Given:  Yes    Kerin Salen 03/15/2017, 10:39 AM

## 2017-03-15 NOTE — Progress Notes (Signed)
Nurse called facility per EMS request due to BP being lower. Nurse spoke to [place] at [employer]. Gregary Signs is aware of her current vital signs as follows: 97.7, 106/64, P 72, R 16, Oxygen 90 on room air. Per Greg[facility] Signs patient may be transported to facility

## 2017-03-15 NOTE — Discharge Summary (Signed)
Patient ID: Tammy Strickland MRN: [id number] DOB/AGE: 1960-10-01 56 y.o.  Admit date: 03/12/2017 Discharge date: 03/15/2017  Admission Diagnoses:  Principal Problem:   Status post total left knee replacement   Discharge Diagnoses:  Same  Past Medical History:  Diagnosis Date  . Anxiety   . Arthritis   . CHF (congestive heart failure) ([place])   . COPD (chronic obstructive pulmonary disease) ([place])   . Coronary artery disease   . Depression   . Diabetes mellitus without complication ([place])    type 2   . GERD (gastroesophageal reflux disease)   . Hypertension   . Lupus   . Neuromuscular disorder ([place])   . Osteoporosis   . Oxygen deficiency    pt uses 2.5L 02 at night   . Peripheral neuropathy   . Sleep apnea    had sleep study done recently ; unaware if she will be getting  a CPAP device ; patient states "im pretty sure i have it , i fall alseep all the time "  . Stroke [facility]) 10/2015    Surgeries: Procedure(s): LEFT TOTAL KNEE ARTHROPLASTY on 03/12/2017   Consultants:   Discharged Condition: Improved  Hospital Course: Charlsie Fleeger is an 56 y.o. female who was admitted 03/12/2017 for operative treatment ofStatus post total left knee replacement. Patient has severe unremitting pain that affects sleep, daily activities, and work/hobbies. After pre-op clearance the patient was taken to the operating room on 03/12/2017 and underwent  Procedure(s): LEFT TOTAL KNEE ARTHROPLASTY.    Patient was given perioperative antibiotics:  Anti-infectives (From admission, onward)   Start     Dose/Rate Route Frequency Ordered Stop   03/12/17 0600  ceFAZolin (ANCEF) 3 g in dextrose 5 % 50 mL IVPB     3 g 130 mL/hr over 30 Minutes Intravenous On call to O.R. 03/11/17 1501 03/12/17 0735       Patient was given sequential compression devices, early ambulation, and chemoprophylaxis to prevent DVT.  Patient benefited maximally from hospital stay and there were no complications.    Recent  vital signs:  Patient Vitals for the past 24 hrs:  BP Temp Temp src Pulse Resp SpO2  03/15/17 0436 128/76 98.6 F (37 C) Oral 75 14 94 %  03/14/17 2053 (!) 150/80 98.8 F (37.1 C) Oral 80 19 99 %  03/14/17 1358 (!) 132/101 98 F (36.7 C) Oral 64 16 95 %     Recent laboratory studies:  Recent Labs    03/13/17 0536  WBC 18.7*  HGB 12.1  HCT 36.6  PLT 301  NA 136  K 4.6  CL 97*  CO2 28  BUN 11  CREATININE 0.81  GLUCOSE 189*  CALCIUM 8.9     Discharge Medications:   Allergies as of 03/15/2017   No Known Allergies     Medication List    STOP taking these medications   amoxicillin-clavulanate 875-125 MG tablet Commonly known as:  AUGMENTIN   oxyCODONE-acetaminophen 10-325 MG tablet Commonly known as:  PERCOCET     TAKE these medications   amitriptyline 25 MG tablet Commonly known as:  ELAVIL TAKE TWO TABLETS BY MOUTH AT BEDTIME What changed:    how much to take  how to take this  when to take this   aspirin EC 81 MG tablet Take 1 tablet (81 mg total) by mouth daily.   atorvastatin 20 MG tablet Commonly known as:  LIPITOR TAKE 1 TABLET(20 MG) BY MOUTH DAILY   clonazePAM 0.5 MG tablet Commonly  known as:  KLONOPIN Take 1 tablet (0.5 mg total) by mouth 2 (two) times daily as needed for anxiety.   cloNIDine 0.3 MG tablet Commonly known as:  CATAPRES Take 1 tablet (0.3 mg total) by mouth daily.   Dulaglutide 1.5 MG/0.5ML Sopn Commonly known as:  TRULICITY Inject 1.5 mg into the skin once a week. What changed:  when to take this   escitalopram 10 MG tablet Commonly known as:  LEXAPRO Take 1 tablet (10 mg total) by mouth daily.   fluconazole 150 MG tablet Commonly known as:  DIFLUCAN Take one tab po now, repeat after you complete the antibiotic and repeat once if needed   fluticasone 50 MCG/ACT nasal spray Commonly known as:  FLONASE SHAKE LIQUID AND INSTILL 1 SPRAY INTO EACH NOSTRIL ONCE DAILY AS NEEDED FOR ALLERGIES   furosemide 80 MG  tablet Commonly known as:  LASIX Take 1 tablet (80 mg total) by mouth daily.   gabapentin 600 MG tablet Commonly known as:  NEURONTIN Take 2 tablets (1,200 mg total) by mouth 3 (three) times daily.   glucose blood test strip Use as instructed to check sugar three times a day   Ipratropium-Albuterol 20-100 MCG/ACT Aers respimat Commonly known as:  COMBIVENT Inhale 1 puff into the lungs every 6 (six) hours. What changed:    when to take this  reasons to take this   Lancets Misc Use as directed twice per day   losartan 50 MG tablet Commonly known as:  COZAAR Take 1 tablet (50 mg total) by mouth 2 (two) times daily.   metFORMIN 500 MG 24 hr tablet Commonly known as:  GLUCOPHAGE-XR TAKE 2 TABLETS BY MOUTH 2  TIMES DAILY WITH A MEAL. What changed:    how much to take  how to take this  when to take this  additional instructions   methocarbamol 500 MG tablet Commonly known as:  ROBAXIN Take 1 tablet (500 mg total) by mouth every 6 (six) hours as needed for muscle spasms.   metoprolol 200 MG 24 hr tablet Commonly known as:  TOPROL-XL Take 1 tablet (200 mg total) by mouth daily. What changed:  when to take this   nitroGLYCERIN 0.4 MG SL tablet Commonly known as:  NITROSTAT Place 0.4 mg under the tongue every 5 (five) minutes as needed for chest pain.   omeprazole 20 MG capsule Commonly known as:  PRILOSEC Take 1 capsule (20 mg total) by mouth daily. What changed:    when to take this  reasons to take this   Oxycodone HCl 10 MG Tabs Take 1-1.5 tablets (10-15 mg total) by mouth every 4 (four) hours as needed for severe pain ((score 7 to 10)).   potassium chloride SA 20 MEQ tablet Commonly known as:  K-DUR,KLOR-CON Take 1 tablet (20 mEq total) by mouth daily.   rivaroxaban 10 MG Tabs tablet Commonly known as:  XARELTO Take 1 tablet (10 mg total) by mouth daily with breakfast.   tiZANidine 4 MG tablet Commonly known as:  ZANAFLEX TAKE 2 AND ONE-HALF  TABLET BY MOUTH 3 TIMES A DAY What changed:  See the new instructions.   traZODone 50 MG tablet Commonly known as:  DESYREL TAKE 1 TABLET(50 MG) BY MOUTH AT BEDTIME            Durable Medical Equipment  (From admission, onward)        Start     Ordered   03/12/17 1118  DME 3 n 1  Once  03/12/17 1117   03/12/17 1118  DME Walker rolling  Once    Question:  Patient needs a walker to treat with the following condition  Answer:  Status post total left knee replacement   03/12/17 1117      Diagnostic Studies: Dg Knee Left Port  Result Date: 03/12/2017 CLINICAL DATA:  Status post left total knee replacement today. EXAM: PORTABLE LEFT KNEE - 1-2 VIEW COMPARISON:  None. FINDINGS: Left total knee arthroplasty is in place. Hardware is intact. No fracture. Gas in the soft tissues from surgery noted. IMPRESSION: Status post left total knee replacement.  No acute abnormality. Electronically Signed   By: Inge Rise M.D.   On: 03/12/2017 09:55    Disposition: to skilled nursing facility  Discharge Instructions    Discharge patient   Complete by:  As directed    Discharge disposition:  03-Skilled [place]   Discharge patient date:  03/15/2017      Follow-up Information    Mcarthur Rossetti, MD Follow up in 2 week(s).   Specialty:  Orthopedic Surgery Contact information: [place] [address] [phone]            Signed: Mcarthur Rossetti 03/15/2017, 7:22 AM

## 2017-03-15 NOTE — Clinical Social Work Placement (Addendum)
PASRR Pending.  D/C Summary sent via HUB,   [place]  NOTE  Date:  03/15/2017  Patient Details  Name: Tammy Strickland MRN: [id number] Date of Birth: January 07, 1961  Clinical Social Work is seeking post-discharge placement for this patient at the [place] level of care (*CSW will initial, date and re-position this form in  chart as items are completed):  Yes   Patient/family provided with [facility] Work Department's list of facilities offering this level of care within the geographic area requested by the patient (or if unable, by the patient's family).  Yes   Patient/family informed of their freedom to choose among providers that offer the needed level of care, that participate in Medicare, Medicaid or managed care program needed by the patient, have an available bed and are willing to accept the patient.  Yes   Patient/family informed of [facility]'s ownership interest in [facility] and [facility], as well as of the fact that they are under no obligation to receive care at these facilities.  PASRR submitted to EDS on       PASRR number received on       Existing PASRR number confirmed on       FL2 transmitted to all facilities in geographic area requested by pt/family on       FL2 transmitted to all facilities within larger geographic area on 03/14/17     Patient informed that his/her managed care company has contracts with or will negotiate with certain facilities, including the following:  [facility]     Yes   Patient/family informed of bed offers received.  Patient chooses bed at [facility]     Physician recommends and patient chooses bed at      Patient to be transferred to [facility] on 03/15/17.  Patient to be transferred to facility by [facility]     Patient family notified on 03/15/17 of transfer.  Name of family member notified:      Daughter-Ebony  PHYSICIAN        Additional Comment:    _______________________________________________ Lia Hopping, LCSW 03/15/2017, 9:56 AM

## 2017-03-15 NOTE — Progress Notes (Addendum)
PT Cancellation Note  Patient Details Name: Sarah-Jane Nazario MRN: [id number] DOB: 1960-05-14   Cancelled Treatment:    Reason Eval/Treat Not Completed: Attempted PT tx session. Pt too drowsy to participate.   Attempted tx session a 2nd time this am. Pt is still too drowsy. She is unable to keep her eyes open to participate.   Weston Anna, MPT Pager: [phone]

## 2017-03-16 DIAGNOSIS — I10 Essential (primary) hypertension: Secondary | ICD-10-CM | POA: Diagnosis not present

## 2017-03-16 DIAGNOSIS — I2510 Atherosclerotic heart disease of native coronary artery without angina pectoris: Secondary | ICD-10-CM | POA: Diagnosis not present

## 2017-03-16 DIAGNOSIS — R5381 Other malaise: Secondary | ICD-10-CM | POA: Diagnosis not present

## 2017-03-16 DIAGNOSIS — E119 Type 2 diabetes mellitus without complications: Secondary | ICD-10-CM | POA: Diagnosis not present

## 2017-03-16 DIAGNOSIS — J449 Chronic obstructive pulmonary disease, unspecified: Secondary | ICD-10-CM | POA: Diagnosis not present

## 2017-03-16 DIAGNOSIS — M25562 Pain in left knee: Secondary | ICD-10-CM | POA: Diagnosis not present

## 2017-03-16 DIAGNOSIS — R262 Difficulty in walking, not elsewhere classified: Secondary | ICD-10-CM | POA: Diagnosis not present

## 2017-03-18 DIAGNOSIS — R262 Difficulty in walking, not elsewhere classified: Secondary | ICD-10-CM | POA: Diagnosis not present

## 2017-03-18 DIAGNOSIS — R5381 Other malaise: Secondary | ICD-10-CM | POA: Diagnosis not present

## 2017-03-18 DIAGNOSIS — M25562 Pain in left knee: Secondary | ICD-10-CM | POA: Diagnosis not present

## 2017-03-19 ENCOUNTER — Telehealth: Payer: Self-pay | Admitting: Internal Medicine

## 2017-03-19 MED ORDER — CLONAZEPAM 0.5 MG PO TABS: 0.5000 mg | ORAL_TABLET | Freq: Two times a day (BID) | ORAL | 0 refills | Status: DC | PRN

## 2017-03-19 NOTE — Telephone Encounter (Signed)
Copied from [place] [phone]. Topic: Inquiry >> Mar 19, 2017  1:57 PM Oliver Pila B wrote: Reason for CRM: pt called from the rehabilitation center([facility]) and the center is asking that the practice fax over the instructions for Rx's of GABAPENTIN and TIZANIDINE, and the specialist is asking for a Rx of KLONOPIN to be prescribed b/c the pt is needing to calm down, fax the instructions to [phone]. Contact office [phone] [facility], or contact pt if needed

## 2017-03-19 NOTE — Telephone Encounter (Signed)
Med list and RX sent to fax number listed. Pt aware

## 2017-03-22 ENCOUNTER — Emergency Department (HOSPITAL_COMMUNITY): Payer: Medicare Other

## 2017-03-22 ENCOUNTER — Encounter (HOSPITAL_COMMUNITY): Payer: Self-pay | Admitting: Emergency Medicine

## 2017-03-22 ENCOUNTER — Emergency Department (HOSPITAL_BASED_OUTPATIENT_CLINIC_OR_DEPARTMENT_OTHER): Admit: 2017-03-22 | Discharge: 2017-03-22 | Disposition: A | Discharge status: Home / Self Care | Payer: Medicare Other

## 2017-03-22 ENCOUNTER — Emergency Department (HOSPITAL_COMMUNITY)
Admission: EM | Admit: 2017-03-22 | Discharge: 2017-03-22 | Disposition: A | Discharge status: Home / Self Care | Payer: Medicare Other | Attending: Emergency Medicine | Admitting: Emergency Medicine

## 2017-03-22 DIAGNOSIS — R609 Edema, unspecified: Secondary | ICD-10-CM | POA: Diagnosis not present

## 2017-03-22 DIAGNOSIS — G8929 Other chronic pain: Secondary | ICD-10-CM | POA: Insufficient documentation

## 2017-03-22 DIAGNOSIS — F17210 Nicotine dependence, cigarettes, uncomplicated: Secondary | ICD-10-CM | POA: Insufficient documentation

## 2017-03-22 DIAGNOSIS — M25562 Pain in left knee: Secondary | ICD-10-CM | POA: Insufficient documentation

## 2017-03-22 DIAGNOSIS — Z7982 Long term (current) use of aspirin: Secondary | ICD-10-CM | POA: Diagnosis not present

## 2017-03-22 DIAGNOSIS — Z7901 Long term (current) use of anticoagulants: Secondary | ICD-10-CM | POA: Insufficient documentation

## 2017-03-22 DIAGNOSIS — Z7984 Long term (current) use of oral hypoglycemic drugs: Secondary | ICD-10-CM | POA: Insufficient documentation

## 2017-03-22 DIAGNOSIS — I509 Heart failure, unspecified: Secondary | ICD-10-CM | POA: Diagnosis not present

## 2017-03-22 DIAGNOSIS — E119 Type 2 diabetes mellitus without complications: Secondary | ICD-10-CM | POA: Diagnosis not present

## 2017-03-22 DIAGNOSIS — Z96652 Presence of left artificial knee joint: Secondary | ICD-10-CM | POA: Insufficient documentation

## 2017-03-22 DIAGNOSIS — J449 Chronic obstructive pulmonary disease, unspecified: Secondary | ICD-10-CM | POA: Insufficient documentation

## 2017-03-22 DIAGNOSIS — I110 Hypertensive heart disease with heart failure: Secondary | ICD-10-CM | POA: Diagnosis not present

## 2017-03-22 DIAGNOSIS — Z8673 Personal history of transient ischemic attack (TIA), and cerebral infarction without residual deficits: Secondary | ICD-10-CM | POA: Diagnosis not present

## 2017-03-22 DIAGNOSIS — R2242 Localized swelling, mass and lump, left lower limb: Secondary | ICD-10-CM | POA: Diagnosis not present

## 2017-03-22 DIAGNOSIS — I2510 Atherosclerotic heart disease of native coronary artery without angina pectoris: Secondary | ICD-10-CM | POA: Diagnosis not present

## 2017-03-22 DIAGNOSIS — M7989 Other specified soft tissue disorders: Secondary | ICD-10-CM | POA: Diagnosis not present

## 2017-03-22 LAB — URINALYSIS, ROUTINE W REFLEX MICROSCOPIC
Bilirubin Urine: NEGATIVE
Glucose, UA: NEGATIVE mg/dL
Hgb urine dipstick: NEGATIVE
Ketones, ur: NEGATIVE mg/dL
Leukocytes, UA: NEGATIVE
Nitrite: NEGATIVE
Protein, ur: NEGATIVE mg/dL
Specific Gravity, Urine: 1.015 (ref 1.005–1.030)
pH: 5 (ref 5.0–8.0)

## 2017-03-22 MED ORDER — OXYCODONE-ACETAMINOPHEN 5-325 MG PO TABS
1.0000 | ORAL_TABLET | Freq: Once | ORAL | Status: AC
  Administered 2017-03-22: 1 via ORAL
  Filled 2017-03-22: qty 1

## 2017-03-22 NOTE — ED Notes (Signed)
Patient c/o burning to groin after [place]. Assisted patient to restroom using wheelchair to obtain urine sample. Provided patient with washcloth and soap and assisted patient to standing position to stand and clean herself.

## 2017-03-22 NOTE — ED Notes (Signed)
Patient given meal tray.

## 2017-03-22 NOTE — Discharge Instructions (Signed)
As discussed, your ultrasound did not show a blood clot in your left leg and your xray did not show any acute abnormality.  Elevate your leg and follow up with your surgeon and primary care provider. Continue taking your pain medications as prescribed. Return if symptoms worsen or new concerning symptoms in the meantime.

## 2017-03-22 NOTE — ED Notes (Signed)
Attempted to call report to [facility] with no answer.

## 2017-03-22 NOTE — ED Provider Notes (Signed)
[facility] DEPT Provider Note   CSN: [id number] Arrival date & time: 03/22/17  1449     History   Chief Complaint Chief Complaint  Patient presents with  . Knee Pain    HPI Tammy Strickland is a 56 y.o. female with extensive past medical history presenting with worsening left lower extremity edema and pain status post left knee replacement on 03/12/17.  Patient is currently in rehab facility for this.  She reports that she had a fall recently in the facility and x-rays were obtained there and negative.  Since then she has been experiencing more pain and swelling of her left extremity. No chest pain, shortness of breath, fever, chills, nausea, vomiting or other symptoms.  HPI  Past Medical History:  Diagnosis Date  . Anxiety   . Arthritis   . CHF (congestive heart failure) ([place])   . COPD (chronic obstructive pulmonary disease) ([place])   . Coronary artery disease   . Depression   . Diabetes mellitus without complication ([place])    type 2   . GERD (gastroesophageal reflux disease)   . Hypertension   . Lupus   . Neuromuscular disorder ([place])   . Osteoporosis   . Oxygen deficiency    pt uses 2.5L 02 at night   . Peripheral neuropathy   . Sleep apnea    had sleep study done recently ; unaware if she will be getting  a CPAP device ; patient states "im pretty sure i have it , i fall alseep all the time "  . Stroke [facility]) 10/2015    Patient Active Problem List   Diagnosis Date Noted  . Status post total left knee replacement 03/12/2017  . Infected cyst of skin 02/26/2017  . Vulvar candidiasis 02/26/2017  . URI (upper respiratory infection) 02/26/2017  . Oral abscess 02/26/2017  . OSA (obstructive sleep apnea) 02/23/2017  . Acute gouty arthritis 12/25/2016  . Stroke ([place])   . Chronic fatigue 11/09/2016  . Hypoxia 11/09/2016  . Morbidly obese ([place]) 11/09/2016  . RLQ abdominal pain 07/21/2016  . Rash 07/21/2016  . Itching 07/21/2016  .  Nonintractable headache 07/21/2016  . Dysuria 07/21/2016  . Allergic rhinitis 07/16/2016  . Vitamin D deficiency 04/16/2016  . Encounter for chronic pain management 03/12/2016  . Hyperlipidemia 01/15/2016  . Gait disorder 12/04/2015  . Diabetes ([place]) 11/26/2015  . Chronic diastolic congestive heart failure ([place]) 11/06/2015  . TIA (transient ischemic attack)   . Carotid-cavernous fistula   . Chest pain 10/31/2015  . Insomnia 10/10/2015  . Depression 10/10/2015  . Benign essential HTN 08/15/2015  . Fibromyalgia 08/15/2015  . Peripheral neuropathy 08/15/2015  . CAD (coronary artery disease) 08/15/2015  . COPD (chronic obstructive pulmonary disease) ([place]) 08/15/2015  . Osteoporosis 08/15/2015  . 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 DDD (degenerative disc disease), lumbar 08/15/2015  . Occupational exposure to industrial toxins 08/15/2015  . GERD (gastroesophageal reflux disease) 08/15/2015    Past Surgical History:  Procedure Laterality Date  . ABDOMINAL HYSTERECTOMY     ovaries left  . APPENDECTOMY    . CHOLECYSTECTOMY    . IR GENERIC HISTORICAL  11/07/2015   IR ANGIO INTRA EXTRACRAN SEL COM CAROTID INNOMINATE BILAT MOD SED 11/07/2015 Luanne Bras, MD [facility]  . IR GENERIC HISTORICAL  11/07/2015   IR ANGIO VERTEBRAL SEL SUBCLAVIAN INNOMINATE UNI R MOD SED 11/07/2015 Luanne Bras, MD [facility]  . IR GENERIC HISTORICAL  11/07/2015   IR ANGIO VERTEBRAL SEL VERTEBRAL UNI  L MOD SED 11/07/2015 Luanne Bras, MD [facility]  . IR GENERIC HISTORICAL  11/07/2015   IR ANGIOGRAM EXTREMITY LEFT 11/07/2015 Luanne Bras, MD [facility]  . TONSILLECTOMY    . TOTAL KNEE ARTHROPLASTY Left 03/12/2017   Procedure: LEFT TOTAL KNEE ARTHROPLASTY;  Surgeon: Mcarthur Rossetti, MD;  Location: [facility] ORS;  Service: Orthopedics;  Laterality: Left;  Adductor Block    OB History    No data available       Home Medications    Prior to Admission medications   Medication Sig  Start Date End Date Taking? Authorizing Provider  amitriptyline (ELAVIL) 25 MG tablet TAKE TWO TABLETS BY MOUTH AT BEDTIME Patient taking differently: TAKE 50 MG BY MOUTH AT BEDTIME 02/25/17   Binnie Rail, MD  aspirin EC 81 MG tablet Take 1 tablet (81 mg total) by mouth daily. 11/01/15   Elgergawy, Silver Huguenin, MD  atorvastatin (LIPITOR) 20 MG tablet TAKE 1 TABLET(20 MG) BY MOUTH DAILY 01/20/17   Burns, Claudina Lick, MD  clonazePAM (KLONOPIN) 0.5 MG tablet Take 1 tablet (0.5 mg total) by mouth 2 (two) times daily as needed for anxiety. 03/19/17   Binnie Rail, MD  cloNIDine (CATAPRES) 0.3 MG tablet Take 1 tablet (0.3 mg total) by mouth daily. 02/02/17   Burns, Claudina Lick, MD  Dulaglutide (TRULICITY) 1.5 WY/6.3ZC SOPN Inject 1.5 mg into the skin once a week. Patient taking differently: Inject 1.5 mg every Sunday into the skin.  11/03/16   Binnie Rail, MD  escitalopram (LEXAPRO) 10 MG tablet Take 1 tablet (10 mg total) by mouth daily. 12/25/16 03/25/17  Biagio Borg, MD  fluconazole (DIFLUCAN) 150 MG tablet Take one tab po now, repeat after you complete the antibiotic and repeat once if needed Patient not taking: Reported on 03/02/2017 02/26/17   Binnie Rail, MD  fluticasone (FLONASE) 50 MCG/ACT nasal spray SHAKE LIQUID AND INSTILL 1 SPRAY INTO EACH NOSTRIL ONCE DAILY AS NEEDED FOR ALLERGIES 02/15/17   Binnie Rail, MD  furosemide (LASIX) 80 MG tablet Take 1 tablet (80 mg total) by mouth daily. 01/28/17   Binnie Rail, MD  gabapentin (NEURONTIN) 600 MG tablet Take 2 tablets (1,200 mg total) by mouth 3 (three) times daily. 01/22/17   Binnie Rail, MD  glucose blood test strip Use as instructed to check sugar three times a day Patient not taking: Reported on 03/02/2017 01/28/17   Binnie Rail, MD  Ipratropium-Albuterol (COMBIVENT) 20-100 MCG/ACT AERS respimat Inhale 1 puff into the lungs every 6 (six) hours. Patient taking differently: Inhale 1 puff every 6 (six) hours as needed into the lungs for  wheezing or shortness of breath.  01/28/17   Binnie Rail, MD  Lancets MISC Use as directed twice per day Patient not taking: Reported on 03/02/2017 01/28/17   Binnie Rail, MD  losartan (COZAAR) 50 MG tablet Take 1 tablet (50 mg total) by mouth 2 (two) times daily. 11/09/16   Binnie Rail, MD  metFORMIN (GLUCOPHAGE-XR) 500 MG 24 hr tablet TAKE 2 TABLETS BY MOUTH 2  TIMES DAILY WITH A MEAL. Patient taking differently: Take 500 mg 2 (two) times daily by mouth.  02/03/17   Binnie Rail, MD  methocarbamol (ROBAXIN) 500 MG tablet Take 1 tablet (500 mg total) by mouth every 6 (six) hours as needed for muscle spasms. 03/15/17   Mcarthur Rossetti, MD  metoprolol (TOPROL-XL) 200 MG 24 hr tablet Take 1 tablet (200 mg total)  by mouth daily. Patient taking differently: Take 200 mg 2 (two) times daily by mouth.  01/28/17   Burns, Claudina Lick, MD  nitroGLYCERIN (NITROSTAT) 0.4 MG SL tablet Place 0.4 mg under the tongue every 5 (five) minutes as needed for chest pain.    [provider]  omeprazole (PRILOSEC) 20 MG capsule Take 1 capsule (20 mg total) by mouth daily. Patient taking differently: Take 20 mg daily as needed by mouth (for acid reflux).  01/28/17   Binnie Rail, MD  oxyCODONE 10 MG TABS Take 1-1.5 tablets (10-15 mg total) by mouth every 4 (four) hours as needed for severe pain ((score 7 to 10)). 03/15/17   Mcarthur Rossetti, MD  potassium chloride SA (K-DUR,KLOR-CON) 20 MEQ tablet Take 1 tablet (20 mEq total) by mouth daily. 01/28/17   Binnie Rail, MD  rivaroxaban (XARELTO) 10 MG TABS tablet Take 1 tablet (10 mg total) by mouth daily with breakfast. 03/15/17   Mcarthur Rossetti, MD  tiZANidine (ZANAFLEX) 4 MG tablet TAKE 2 AND ONE-HALF TABLET BY MOUTH 3 TIMES A DAY Patient taking differently: TAKE 10 MG BY MOUTH 3 TIMES A DAY 02/26/17   Binnie Rail, MD  traZODone (DESYREL) 50 MG tablet TAKE 1 TABLET(50 MG) BY MOUTH AT BEDTIME 01/28/17   Binnie Rail, MD     Family History Family History  Problem Relation Age of Onset  . Hyperlipidemia Mother   . Hypertension Mother   . Hyperlipidemia Father   . Hypertension Father   . Stroke Father   . Hypertension Sister   . Cancer Sister        breast cancer  . Crohn's disease Sister     Social History Social History   Tobacco Use  . Smoking status: Current Every Day Smoker    Packs/day: 0.25    Years: 35.00    Pack years: 8.75    Types: Cigarettes  . Smokeless tobacco: Never Used  . Tobacco comment: referred  to smoking  cessation  classes. at  [facility]   Substance Use Topics  . Alcohol use: No    Alcohol/week: 1.2 - 1.8 oz    Types: 2 - 3 Standard drinks or equivalent per week  . 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      Allergies   Patient has no known allergies.   Review of Systems Review of Systems  Constitutional: Negative for chills and fever.  Eyes: Negative for pain and visual disturbance.  Respiratory: Negative for cough, chest tightness, shortness of breath, wheezing and stridor.   Cardiovascular: Positive for leg swelling. Negative for chest pain and palpitations.  Gastrointestinal: Negative for abdominal pain and vomiting.  Genitourinary: Negative for dysuria and hematuria.  Musculoskeletal: Positive for arthralgias, joint swelling and myalgias. Negative for back pain, neck pain and neck stiffness.  Skin: Negative for color change, pallor and rash.  Neurological: Negative for seizures and syncope.     Physical Exam Updated Vital Signs BP 127/83   Pulse 66   Temp 98.2 F (36.8 C) (Oral)   Resp 18   SpO2 93%   Physical Exam  Constitutional: She appears well-developed and well-nourished. No distress.  Afebrile, nontoxic-appearing, sitting comfortably in chair in no acute distress.  HENT:  Head: Normocephalic and atraumatic.  Eyes: Conjunctivae are normal.  Neck: Neck supple.  Cardiovascular: Normal rate, regular rhythm, normal heart sounds and intact  distal pulses.  Pulmonary/Chest: Effort normal and breath sounds normal. No stridor.  No respiratory distress. She has no wheezes. She has no rales.  Musculoskeletal: Normal range of motion. She exhibits edema and tenderness.  Non-pitting edema to the left lower extremity from thigh to foot. Dorsalis pedis pulses intact. Sensation intact. Patient reports pain on palpation of posterior thigh and calf.  Neurological: She is alert. No sensory deficit.  Neurovascularly intact  Skin: Skin is warm and dry. No rash noted. She is not diaphoretic. No erythema. No pallor.  Well-healing surgical wound. Nor surrounding erythema, warmth or purulence.   Psychiatric: She has a normal mood and affect.  Nursing note and vitals reviewed.    ED Treatments / Results  Labs (all labs ordered are listed, but only abnormal results are displayed) Labs Reviewed  URINALYSIS, ROUTINE W REFLEX MICROSCOPIC    EKG  EKG Interpretation None       Radiology Dg Knee Complete 4 Views Left  Result Date: 03/22/2017 CLINICAL DATA:  Pain and swelling, no known injury, initial encounter EXAM: LEFT KNEE - COMPLETE 4+ VIEW COMPARISON:  03/12/2017 FINDINGS: Left knee replacement is noted. Mild subcutaneous edema is seen. No acute fracture or dislocation is noted. No abnormality of the prosthesis is seen. IMPRESSION: Mild soft tissue swelling. No acute bony abnormality is noted. Electronically Signed   By: Inez Catalina M.D.   On: 03/22/2017 17:17    Procedures Procedures (including critical care time)  Medications Ordered in ED Medications  oxyCODONE-acetaminophen (PERCOCET/ROXICET) 5-325 MG per tablet 1-2 tablet (1 tablet Oral Given 03/22/17 1851)     Initial Impression / Assessment and Plan / ED Course  I have reviewed the triage vital signs and the nursing notes.  Pertinent labs & imaging results that were available during my care of the patient were reviewed by me and considered in my medical decision making  (see chart for details).     Patient presenting with chronic left knee pain and edema. She is currently at a rehab facility s/p left knee replacement. Ultrasound negative for DVT Knee x-ray without any acute abnormalities Well-healing incision. No chest pain or SOB. SPO2 above 95% on my initial assessment and reassessment.  Advised patient to follow up with her surgeon and PCP. She is otherwise well-appearing, afebrile, nontoxic.  DC to rehab with follow up.  Discussed strict return precautions and advised to return to the emergency department if experiencing any new or worsening symptoms. Instructions were understood and patient agreed with discharge plan. Final Clinical Impressions(s) / ED Diagnoses   Final diagnoses:  Chronic pain of left knee    ED Discharge Orders    None       Dossie Der 03/22/17 2335    Carmin Muskrat, MD 03/23/17 1650

## 2017-03-22 NOTE — ED Notes (Signed)
[facility] called for transport.  

## 2017-03-22 NOTE — ED Notes (Signed)
Patient transported to X-ray 

## 2017-03-22 NOTE — Progress Notes (Signed)
Left lower extremity venous duplex has been completed. Negative for obvious DVT. Results were given to Avie Echevaria PA.  03/22/17 5:36 PM Carlos Levering RVT

## 2017-03-22 NOTE — ED Notes (Signed)
US at bedside

## 2017-03-22 NOTE — ED Notes (Signed)
Redressed wound with gauze and wrapped with ace bandage by ortho tech.

## 2017-03-22 NOTE — ED Triage Notes (Addendum)
Per [facility], patient coming from [facility], c/o left knee pain after replacement on 11/30. Reports fall x1 week ago. Had shoulder and wrist XR at that time. Denies knee pain at time of fall. Oxycodone given at 1300.

## 2017-03-26 ENCOUNTER — Other Ambulatory Visit: Payer: Self-pay | Admitting: Internal Medicine

## 2017-03-26 DIAGNOSIS — R5381 Other malaise: Secondary | ICD-10-CM | POA: Diagnosis not present

## 2017-03-26 DIAGNOSIS — M6281 Muscle weakness (generalized): Secondary | ICD-10-CM | POA: Diagnosis not present

## 2017-03-26 DIAGNOSIS — R262 Difficulty in walking, not elsewhere classified: Secondary | ICD-10-CM | POA: Diagnosis not present

## 2017-03-26 DIAGNOSIS — M25562 Pain in left knee: Secondary | ICD-10-CM | POA: Diagnosis not present

## 2017-03-26 DIAGNOSIS — G894 Chronic pain syndrome: Secondary | ICD-10-CM | POA: Diagnosis not present

## 2017-03-29 ENCOUNTER — Other Ambulatory Visit: Payer: Self-pay | Admitting: *Deleted

## 2017-03-29 ENCOUNTER — Ambulatory Visit (INDEPENDENT_AMBULATORY_CARE_PROVIDER_SITE_OTHER): Payer: Medicare Other | Admitting: Physician Assistant

## 2017-03-29 NOTE — Patient Outreach (Signed)
[facility]) Care Management  03/29/2017  Tammy Strickland [birth year] [id number]   Notified by Eugenie Birks, SW at SNF that patient will discharge home 12/19. Will notify [facility] care team of patient discharge plan. Royetta Crochet. Laymond Purser, RN, BSN, [place] [phone]) Business Cell  [phone]) Toll Free Office

## 2017-03-30 DIAGNOSIS — E119 Type 2 diabetes mellitus without complications: Secondary | ICD-10-CM | POA: Diagnosis not present

## 2017-03-30 DIAGNOSIS — R2689 Other abnormalities of gait and mobility: Secondary | ICD-10-CM | POA: Diagnosis not present

## 2017-03-30 DIAGNOSIS — I10 Essential (primary) hypertension: Secondary | ICD-10-CM | POA: Diagnosis not present

## 2017-03-30 DIAGNOSIS — J449 Chronic obstructive pulmonary disease, unspecified: Secondary | ICD-10-CM | POA: Diagnosis not present

## 2017-03-30 DIAGNOSIS — I5040 Unspecified combined systolic (congestive) and diastolic (congestive) heart failure: Secondary | ICD-10-CM | POA: Diagnosis not present

## 2017-03-31 ENCOUNTER — Telehealth (INDEPENDENT_AMBULATORY_CARE_PROVIDER_SITE_OTHER): Payer: Self-pay | Admitting: Orthopaedic Surgery

## 2017-03-31 ENCOUNTER — Ambulatory Visit: Payer: Self-pay | Admitting: *Deleted

## 2017-03-31 ENCOUNTER — Telehealth: Payer: Self-pay | Admitting: Internal Medicine

## 2017-03-31 MED ORDER — OXYCODONE-ACETAMINOPHEN 5-325 MG PO TABS: 1.0000 | ORAL_TABLET | Freq: Four times a day (QID) | ORAL | 0 refills | Status: DC | PRN

## 2017-03-31 NOTE — Telephone Encounter (Unsigned)
Copied from [place] [phone]. Topic: General - Other >> Mar 31, 2017 10:10 AM Neva Seat wrote: Pt had knee surgery on 03/12/17 - in pain needing the refill on Oxycodone 10.325

## 2017-03-31 NOTE — Telephone Encounter (Signed)
Patient called for refill on oxycodone that was prescribed to her after her LT knee replacement on 03/12/17. Encouraged patient to call surgeon for refill. Stated she would.

## 2017-03-31 NOTE — Telephone Encounter (Signed)
LMOM for patient letting her know I have Rx at front desk

## 2017-03-31 NOTE — Telephone Encounter (Signed)
Patient called needing Rx filled for Oxycodone. Patient advised her son will need to pick up Rx. Patient stated she was released from the hospital today at 9:30am. Patient advised she is out of pain medicine and in pain. The number to contact patient is [phone]

## 2017-03-31 NOTE — Telephone Encounter (Signed)
Please advise 

## 2017-03-31 NOTE — Telephone Encounter (Signed)
Can come and pick up a script 

## 2017-04-02 ENCOUNTER — Telehealth: Payer: Self-pay | Admitting: Internal Medicine

## 2017-04-02 DIAGNOSIS — M810 Age-related osteoporosis without current pathological fracture: Secondary | ICD-10-CM | POA: Diagnosis not present

## 2017-04-02 DIAGNOSIS — G4733 Obstructive sleep apnea (adult) (pediatric): Secondary | ICD-10-CM | POA: Diagnosis not present

## 2017-04-02 DIAGNOSIS — E1142 Type 2 diabetes mellitus with diabetic polyneuropathy: Secondary | ICD-10-CM | POA: Diagnosis not present

## 2017-04-02 DIAGNOSIS — J449 Chronic obstructive pulmonary disease, unspecified: Secondary | ICD-10-CM | POA: Diagnosis not present

## 2017-04-02 DIAGNOSIS — Z471 Aftercare following joint replacement surgery: Secondary | ICD-10-CM | POA: Diagnosis not present

## 2017-04-02 DIAGNOSIS — I5020 Unspecified systolic (congestive) heart failure: Secondary | ICD-10-CM | POA: Diagnosis not present

## 2017-04-02 DIAGNOSIS — I2510 Atherosclerotic heart disease of native coronary artery without angina pectoris: Secondary | ICD-10-CM | POA: Diagnosis not present

## 2017-04-02 DIAGNOSIS — M5136 Other intervertebral disc degeneration, lumbar region: Secondary | ICD-10-CM | POA: Diagnosis not present

## 2017-04-02 DIAGNOSIS — I130 Hypertensive heart and chronic kidney disease with heart failure and stage 1 through stage 4 chronic kidney disease, or unspecified chronic kidney disease: Secondary | ICD-10-CM | POA: Diagnosis not present

## 2017-04-02 DIAGNOSIS — I5032 Chronic diastolic (congestive) heart failure: Secondary | ICD-10-CM | POA: Diagnosis not present

## 2017-04-02 DIAGNOSIS — M109 Gout, unspecified: Secondary | ICD-10-CM | POA: Diagnosis not present

## 2017-04-02 DIAGNOSIS — M797 Fibromyalgia: Secondary | ICD-10-CM | POA: Diagnosis not present

## 2017-04-02 NOTE — Telephone Encounter (Signed)
Copied from [place] [phone]. Topic: General - Other >> Apr 02, 2017  4:17 PM Aurelio Brash B wrote: Pt had Left knee replacement   then went to [facility] after replacement - she was  discharged  on dec 19th .   She now needs verbal orders for after care 1 time for a week  then 2 times for 4 weeks and  home health aide 2 times a week for 4 weeks  [facility] [place] [phone] fax  number   [phone]

## 2017-04-04 ENCOUNTER — Encounter (HOSPITAL_BASED_OUTPATIENT_CLINIC_OR_DEPARTMENT_OTHER): Payer: Self-pay

## 2017-04-05 ENCOUNTER — Telehealth: Payer: Self-pay | Admitting: Internal Medicine

## 2017-04-05 DIAGNOSIS — M797 Fibromyalgia: Secondary | ICD-10-CM

## 2017-04-05 DIAGNOSIS — G6289 Other specified polyneuropathies: Secondary | ICD-10-CM

## 2017-04-05 DIAGNOSIS — R269 Unspecified abnormalities of gait and mobility: Secondary | ICD-10-CM

## 2017-04-05 DIAGNOSIS — I130 Hypertensive heart and chronic kidney disease with heart failure and stage 1 through stage 4 chronic kidney disease, or unspecified chronic kidney disease: Secondary | ICD-10-CM | POA: Diagnosis not present

## 2017-04-05 DIAGNOSIS — Z471 Aftercare following joint replacement surgery: Secondary | ICD-10-CM | POA: Diagnosis not present

## 2017-04-05 DIAGNOSIS — I2510 Atherosclerotic heart disease of native coronary artery without angina pectoris: Secondary | ICD-10-CM | POA: Diagnosis not present

## 2017-04-05 DIAGNOSIS — M109 Gout, unspecified: Secondary | ICD-10-CM | POA: Diagnosis not present

## 2017-04-05 DIAGNOSIS — Z96652 Presence of left artificial knee joint: Secondary | ICD-10-CM

## 2017-04-05 DIAGNOSIS — M810 Age-related osteoporosis without current pathological fracture: Secondary | ICD-10-CM | POA: Diagnosis not present

## 2017-04-05 DIAGNOSIS — I5020 Unspecified systolic (congestive) heart failure: Secondary | ICD-10-CM | POA: Diagnosis not present

## 2017-04-05 DIAGNOSIS — G4733 Obstructive sleep apnea (adult) (pediatric): Secondary | ICD-10-CM | POA: Diagnosis not present

## 2017-04-05 DIAGNOSIS — I5032 Chronic diastolic (congestive) heart failure: Secondary | ICD-10-CM | POA: Diagnosis not present

## 2017-04-05 DIAGNOSIS — M5136 Other intervertebral disc degeneration, lumbar region: Secondary | ICD-10-CM | POA: Diagnosis not present

## 2017-04-05 DIAGNOSIS — J449 Chronic obstructive pulmonary disease, unspecified: Secondary | ICD-10-CM | POA: Diagnosis not present

## 2017-04-05 DIAGNOSIS — E1142 Type 2 diabetes mellitus with diabetic polyneuropathy: Secondary | ICD-10-CM | POA: Diagnosis not present

## 2017-04-05 NOTE — Telephone Encounter (Signed)
Copied from [place]. Topic: Quick Communication - See Telephone Encounter >> Apr 05, 2017  1:17 PM Cleaster Corin, NT wrote: CRM for notification. See Telephone encounter for:   04/05/17. Haywood Pao Called from [employer] to get verbal order for pt. For frequency of OT   2 week 4  Rx. Order for Rollator and 3 n 1 potty chair

## 2017-04-05 NOTE — Telephone Encounter (Signed)
Ok to give verbals 

## 2017-04-05 NOTE — Telephone Encounter (Signed)
Called Pam was not in the office spoke w/case manager Calla Kicks) gave MD verbal.../lmb

## 2017-04-07 ENCOUNTER — Telehealth (INDEPENDENT_AMBULATORY_CARE_PROVIDER_SITE_OTHER): Payer: Self-pay | Admitting: Orthopaedic Surgery

## 2017-04-07 DIAGNOSIS — M797 Fibromyalgia: Secondary | ICD-10-CM | POA: Diagnosis not present

## 2017-04-07 DIAGNOSIS — M810 Age-related osteoporosis without current pathological fracture: Secondary | ICD-10-CM | POA: Diagnosis not present

## 2017-04-07 DIAGNOSIS — M109 Gout, unspecified: Secondary | ICD-10-CM | POA: Diagnosis not present

## 2017-04-07 DIAGNOSIS — Z471 Aftercare following joint replacement surgery: Secondary | ICD-10-CM | POA: Diagnosis not present

## 2017-04-07 DIAGNOSIS — I5020 Unspecified systolic (congestive) heart failure: Secondary | ICD-10-CM | POA: Diagnosis not present

## 2017-04-07 DIAGNOSIS — J449 Chronic obstructive pulmonary disease, unspecified: Secondary | ICD-10-CM | POA: Diagnosis not present

## 2017-04-07 DIAGNOSIS — I2510 Atherosclerotic heart disease of native coronary artery without angina pectoris: Secondary | ICD-10-CM | POA: Diagnosis not present

## 2017-04-07 DIAGNOSIS — G4733 Obstructive sleep apnea (adult) (pediatric): Secondary | ICD-10-CM | POA: Diagnosis not present

## 2017-04-07 DIAGNOSIS — E1142 Type 2 diabetes mellitus with diabetic polyneuropathy: Secondary | ICD-10-CM | POA: Diagnosis not present

## 2017-04-07 DIAGNOSIS — M5136 Other intervertebral disc degeneration, lumbar region: Secondary | ICD-10-CM | POA: Diagnosis not present

## 2017-04-07 DIAGNOSIS — I5032 Chronic diastolic (congestive) heart failure: Secondary | ICD-10-CM | POA: Diagnosis not present

## 2017-04-07 DIAGNOSIS — I130 Hypertensive heart and chronic kidney disease with heart failure and stage 1 through stage 4 chronic kidney disease, or unspecified chronic kidney disease: Secondary | ICD-10-CM | POA: Diagnosis not present

## 2017-04-07 NOTE — Telephone Encounter (Signed)
[facility]  [phone] [facility]  Verbal Orders   Two times a week for one weeks  Three times a week for two weeks

## 2017-04-07 NOTE — Telephone Encounter (Signed)
I left voicemail advising. ?

## 2017-04-07 NOTE — Telephone Encounter (Signed)
That will be fine. 

## 2017-04-07 NOTE — Telephone Encounter (Signed)
Ok for verbal orders ?

## 2017-04-08 DIAGNOSIS — M797 Fibromyalgia: Secondary | ICD-10-CM | POA: Diagnosis not present

## 2017-04-08 DIAGNOSIS — M109 Gout, unspecified: Secondary | ICD-10-CM | POA: Diagnosis not present

## 2017-04-08 DIAGNOSIS — Z471 Aftercare following joint replacement surgery: Secondary | ICD-10-CM | POA: Diagnosis not present

## 2017-04-08 DIAGNOSIS — M810 Age-related osteoporosis without current pathological fracture: Secondary | ICD-10-CM | POA: Diagnosis not present

## 2017-04-08 DIAGNOSIS — I130 Hypertensive heart and chronic kidney disease with heart failure and stage 1 through stage 4 chronic kidney disease, or unspecified chronic kidney disease: Secondary | ICD-10-CM | POA: Diagnosis not present

## 2017-04-08 DIAGNOSIS — I5032 Chronic diastolic (congestive) heart failure: Secondary | ICD-10-CM | POA: Diagnosis not present

## 2017-04-08 DIAGNOSIS — M5136 Other intervertebral disc degeneration, lumbar region: Secondary | ICD-10-CM | POA: Diagnosis not present

## 2017-04-08 DIAGNOSIS — I5020 Unspecified systolic (congestive) heart failure: Secondary | ICD-10-CM | POA: Diagnosis not present

## 2017-04-08 DIAGNOSIS — J449 Chronic obstructive pulmonary disease, unspecified: Secondary | ICD-10-CM | POA: Diagnosis not present

## 2017-04-08 DIAGNOSIS — G4733 Obstructive sleep apnea (adult) (pediatric): Secondary | ICD-10-CM | POA: Diagnosis not present

## 2017-04-08 DIAGNOSIS — E1142 Type 2 diabetes mellitus with diabetic polyneuropathy: Secondary | ICD-10-CM | POA: Diagnosis not present

## 2017-04-08 DIAGNOSIS — I2510 Atherosclerotic heart disease of native coronary artery without angina pectoris: Secondary | ICD-10-CM | POA: Diagnosis not present

## 2017-04-08 NOTE — Telephone Encounter (Signed)
Spoke with Costella Hatcher from [facility] to give verbal orders per MD for OT. CB [phone]   DME orders pending for [facility], please enter DX for orders

## 2017-04-09 DIAGNOSIS — I5032 Chronic diastolic (congestive) heart failure: Secondary | ICD-10-CM | POA: Diagnosis not present

## 2017-04-09 DIAGNOSIS — E1142 Type 2 diabetes mellitus with diabetic polyneuropathy: Secondary | ICD-10-CM | POA: Diagnosis not present

## 2017-04-09 DIAGNOSIS — I130 Hypertensive heart and chronic kidney disease with heart failure and stage 1 through stage 4 chronic kidney disease, or unspecified chronic kidney disease: Secondary | ICD-10-CM | POA: Diagnosis not present

## 2017-04-09 DIAGNOSIS — M5136 Other intervertebral disc degeneration, lumbar region: Secondary | ICD-10-CM | POA: Diagnosis not present

## 2017-04-09 DIAGNOSIS — I5020 Unspecified systolic (congestive) heart failure: Secondary | ICD-10-CM | POA: Diagnosis not present

## 2017-04-09 DIAGNOSIS — I2510 Atherosclerotic heart disease of native coronary artery without angina pectoris: Secondary | ICD-10-CM | POA: Diagnosis not present

## 2017-04-09 DIAGNOSIS — M810 Age-related osteoporosis without current pathological fracture: Secondary | ICD-10-CM | POA: Diagnosis not present

## 2017-04-09 DIAGNOSIS — M109 Gout, unspecified: Secondary | ICD-10-CM | POA: Diagnosis not present

## 2017-04-09 DIAGNOSIS — J449 Chronic obstructive pulmonary disease, unspecified: Secondary | ICD-10-CM | POA: Diagnosis not present

## 2017-04-09 DIAGNOSIS — M797 Fibromyalgia: Secondary | ICD-10-CM | POA: Diagnosis not present

## 2017-04-09 DIAGNOSIS — G4733 Obstructive sleep apnea (adult) (pediatric): Secondary | ICD-10-CM | POA: Diagnosis not present

## 2017-04-09 DIAGNOSIS — Z471 Aftercare following joint replacement surgery: Secondary | ICD-10-CM | POA: Diagnosis not present

## 2017-04-10 DIAGNOSIS — M5136 Other intervertebral disc degeneration, lumbar region: Secondary | ICD-10-CM | POA: Diagnosis not present

## 2017-04-10 DIAGNOSIS — J449 Chronic obstructive pulmonary disease, unspecified: Secondary | ICD-10-CM | POA: Diagnosis not present

## 2017-04-10 DIAGNOSIS — I5020 Unspecified systolic (congestive) heart failure: Secondary | ICD-10-CM | POA: Diagnosis not present

## 2017-04-10 DIAGNOSIS — M797 Fibromyalgia: Secondary | ICD-10-CM | POA: Diagnosis not present

## 2017-04-10 DIAGNOSIS — E1142 Type 2 diabetes mellitus with diabetic polyneuropathy: Secondary | ICD-10-CM | POA: Diagnosis not present

## 2017-04-10 DIAGNOSIS — M810 Age-related osteoporosis without current pathological fracture: Secondary | ICD-10-CM | POA: Diagnosis not present

## 2017-04-10 DIAGNOSIS — I5032 Chronic diastolic (congestive) heart failure: Secondary | ICD-10-CM | POA: Diagnosis not present

## 2017-04-10 DIAGNOSIS — I2510 Atherosclerotic heart disease of native coronary artery without angina pectoris: Secondary | ICD-10-CM | POA: Diagnosis not present

## 2017-04-10 DIAGNOSIS — I130 Hypertensive heart and chronic kidney disease with heart failure and stage 1 through stage 4 chronic kidney disease, or unspecified chronic kidney disease: Secondary | ICD-10-CM | POA: Diagnosis not present

## 2017-04-10 DIAGNOSIS — G4733 Obstructive sleep apnea (adult) (pediatric): Secondary | ICD-10-CM | POA: Diagnosis not present

## 2017-04-10 DIAGNOSIS — Z471 Aftercare following joint replacement surgery: Secondary | ICD-10-CM | POA: Diagnosis not present

## 2017-04-10 DIAGNOSIS — M109 Gout, unspecified: Secondary | ICD-10-CM | POA: Diagnosis not present

## 2017-04-11 DIAGNOSIS — M791 Myalgia, unspecified site: Secondary | ICD-10-CM | POA: Diagnosis not present

## 2017-04-12 ENCOUNTER — Other Ambulatory Visit: Payer: Self-pay | Admitting: Internal Medicine

## 2017-04-12 DIAGNOSIS — I2510 Atherosclerotic heart disease of native coronary artery without angina pectoris: Secondary | ICD-10-CM | POA: Diagnosis not present

## 2017-04-12 DIAGNOSIS — I5032 Chronic diastolic (congestive) heart failure: Secondary | ICD-10-CM | POA: Diagnosis not present

## 2017-04-12 DIAGNOSIS — M109 Gout, unspecified: Secondary | ICD-10-CM | POA: Diagnosis not present

## 2017-04-12 DIAGNOSIS — M797 Fibromyalgia: Secondary | ICD-10-CM | POA: Diagnosis not present

## 2017-04-12 DIAGNOSIS — E1142 Type 2 diabetes mellitus with diabetic polyneuropathy: Secondary | ICD-10-CM | POA: Diagnosis not present

## 2017-04-12 DIAGNOSIS — I130 Hypertensive heart and chronic kidney disease with heart failure and stage 1 through stage 4 chronic kidney disease, or unspecified chronic kidney disease: Secondary | ICD-10-CM | POA: Diagnosis not present

## 2017-04-12 DIAGNOSIS — Z471 Aftercare following joint replacement surgery: Secondary | ICD-10-CM | POA: Diagnosis not present

## 2017-04-12 DIAGNOSIS — I10 Essential (primary) hypertension: Secondary | ICD-10-CM

## 2017-04-12 DIAGNOSIS — M810 Age-related osteoporosis without current pathological fracture: Secondary | ICD-10-CM | POA: Diagnosis not present

## 2017-04-12 DIAGNOSIS — G4733 Obstructive sleep apnea (adult) (pediatric): Secondary | ICD-10-CM | POA: Diagnosis not present

## 2017-04-12 DIAGNOSIS — J449 Chronic obstructive pulmonary disease, unspecified: Secondary | ICD-10-CM | POA: Diagnosis not present

## 2017-04-12 DIAGNOSIS — I5020 Unspecified systolic (congestive) heart failure: Secondary | ICD-10-CM | POA: Diagnosis not present

## 2017-04-12 DIAGNOSIS — M5136 Other intervertebral disc degeneration, lumbar region: Secondary | ICD-10-CM | POA: Diagnosis not present

## 2017-04-13 ENCOUNTER — Other Ambulatory Visit: Payer: Self-pay | Admitting: Internal Medicine

## 2017-04-14 DIAGNOSIS — I2510 Atherosclerotic heart disease of native coronary artery without angina pectoris: Secondary | ICD-10-CM | POA: Diagnosis not present

## 2017-04-14 DIAGNOSIS — I5032 Chronic diastolic (congestive) heart failure: Secondary | ICD-10-CM | POA: Diagnosis not present

## 2017-04-14 DIAGNOSIS — M810 Age-related osteoporosis without current pathological fracture: Secondary | ICD-10-CM | POA: Diagnosis not present

## 2017-04-14 DIAGNOSIS — G4733 Obstructive sleep apnea (adult) (pediatric): Secondary | ICD-10-CM | POA: Diagnosis not present

## 2017-04-14 DIAGNOSIS — M109 Gout, unspecified: Secondary | ICD-10-CM | POA: Diagnosis not present

## 2017-04-14 DIAGNOSIS — M5136 Other intervertebral disc degeneration, lumbar region: Secondary | ICD-10-CM | POA: Diagnosis not present

## 2017-04-14 DIAGNOSIS — I5020 Unspecified systolic (congestive) heart failure: Secondary | ICD-10-CM | POA: Diagnosis not present

## 2017-04-14 DIAGNOSIS — M797 Fibromyalgia: Secondary | ICD-10-CM | POA: Diagnosis not present

## 2017-04-14 DIAGNOSIS — J449 Chronic obstructive pulmonary disease, unspecified: Secondary | ICD-10-CM | POA: Diagnosis not present

## 2017-04-14 DIAGNOSIS — I130 Hypertensive heart and chronic kidney disease with heart failure and stage 1 through stage 4 chronic kidney disease, or unspecified chronic kidney disease: Secondary | ICD-10-CM | POA: Diagnosis not present

## 2017-04-14 DIAGNOSIS — E1142 Type 2 diabetes mellitus with diabetic polyneuropathy: Secondary | ICD-10-CM | POA: Diagnosis not present

## 2017-04-14 DIAGNOSIS — Z471 Aftercare following joint replacement surgery: Secondary | ICD-10-CM | POA: Diagnosis not present

## 2017-04-16 ENCOUNTER — Telehealth (INDEPENDENT_AMBULATORY_CARE_PROVIDER_SITE_OTHER): Payer: Self-pay | Admitting: Orthopaedic Surgery

## 2017-04-16 DIAGNOSIS — I130 Hypertensive heart and chronic kidney disease with heart failure and stage 1 through stage 4 chronic kidney disease, or unspecified chronic kidney disease: Secondary | ICD-10-CM | POA: Diagnosis not present

## 2017-04-16 DIAGNOSIS — J449 Chronic obstructive pulmonary disease, unspecified: Secondary | ICD-10-CM | POA: Diagnosis not present

## 2017-04-16 DIAGNOSIS — I2510 Atherosclerotic heart disease of native coronary artery without angina pectoris: Secondary | ICD-10-CM | POA: Diagnosis not present

## 2017-04-16 DIAGNOSIS — M810 Age-related osteoporosis without current pathological fracture: Secondary | ICD-10-CM | POA: Diagnosis not present

## 2017-04-16 DIAGNOSIS — M109 Gout, unspecified: Secondary | ICD-10-CM | POA: Diagnosis not present

## 2017-04-16 DIAGNOSIS — Z471 Aftercare following joint replacement surgery: Secondary | ICD-10-CM | POA: Diagnosis not present

## 2017-04-16 DIAGNOSIS — I5032 Chronic diastolic (congestive) heart failure: Secondary | ICD-10-CM | POA: Diagnosis not present

## 2017-04-16 DIAGNOSIS — G4733 Obstructive sleep apnea (adult) (pediatric): Secondary | ICD-10-CM | POA: Diagnosis not present

## 2017-04-16 DIAGNOSIS — E1142 Type 2 diabetes mellitus with diabetic polyneuropathy: Secondary | ICD-10-CM | POA: Diagnosis not present

## 2017-04-16 DIAGNOSIS — M5136 Other intervertebral disc degeneration, lumbar region: Secondary | ICD-10-CM | POA: Diagnosis not present

## 2017-04-16 DIAGNOSIS — M797 Fibromyalgia: Secondary | ICD-10-CM | POA: Diagnosis not present

## 2017-04-16 DIAGNOSIS — I5020 Unspecified systolic (congestive) heart failure: Secondary | ICD-10-CM | POA: Diagnosis not present

## 2017-04-16 NOTE — Telephone Encounter (Signed)
Update prescription  Pt called and would like her prescription to be updated @the  [facility] on [address].

## 2017-04-19 ENCOUNTER — Telehealth (INDEPENDENT_AMBULATORY_CARE_PROVIDER_SITE_OTHER): Payer: Self-pay | Admitting: Radiology

## 2017-04-19 DIAGNOSIS — G4733 Obstructive sleep apnea (adult) (pediatric): Secondary | ICD-10-CM | POA: Diagnosis not present

## 2017-04-19 DIAGNOSIS — M797 Fibromyalgia: Secondary | ICD-10-CM | POA: Diagnosis not present

## 2017-04-19 DIAGNOSIS — M25559 Pain in unspecified hip: Secondary | ICD-10-CM | POA: Diagnosis not present

## 2017-04-19 DIAGNOSIS — I5020 Unspecified systolic (congestive) heart failure: Secondary | ICD-10-CM | POA: Diagnosis not present

## 2017-04-19 DIAGNOSIS — M810 Age-related osteoporosis without current pathological fracture: Secondary | ICD-10-CM | POA: Diagnosis not present

## 2017-04-19 DIAGNOSIS — I5032 Chronic diastolic (congestive) heart failure: Secondary | ICD-10-CM | POA: Diagnosis not present

## 2017-04-19 DIAGNOSIS — E1142 Type 2 diabetes mellitus with diabetic polyneuropathy: Secondary | ICD-10-CM | POA: Diagnosis not present

## 2017-04-19 DIAGNOSIS — I130 Hypertensive heart and chronic kidney disease with heart failure and stage 1 through stage 4 chronic kidney disease, or unspecified chronic kidney disease: Secondary | ICD-10-CM | POA: Diagnosis not present

## 2017-04-19 DIAGNOSIS — M109 Gout, unspecified: Secondary | ICD-10-CM | POA: Diagnosis not present

## 2017-04-19 DIAGNOSIS — I2510 Atherosclerotic heart disease of native coronary artery without angina pectoris: Secondary | ICD-10-CM | POA: Diagnosis not present

## 2017-04-19 DIAGNOSIS — Z79891 Long term (current) use of opiate analgesic: Secondary | ICD-10-CM | POA: Diagnosis not present

## 2017-04-19 DIAGNOSIS — M79606 Pain in leg, unspecified: Secondary | ICD-10-CM | POA: Diagnosis not present

## 2017-04-19 DIAGNOSIS — M5136 Other intervertebral disc degeneration, lumbar region: Secondary | ICD-10-CM | POA: Diagnosis not present

## 2017-04-19 DIAGNOSIS — M545 Low back pain: Secondary | ICD-10-CM | POA: Diagnosis not present

## 2017-04-19 DIAGNOSIS — G894 Chronic pain syndrome: Secondary | ICD-10-CM | POA: Diagnosis not present

## 2017-04-19 DIAGNOSIS — Z471 Aftercare following joint replacement surgery: Secondary | ICD-10-CM | POA: Diagnosis not present

## 2017-04-19 DIAGNOSIS — J449 Chronic obstructive pulmonary disease, unspecified: Secondary | ICD-10-CM | POA: Diagnosis not present

## 2017-04-19 NOTE — Telephone Encounter (Signed)
See below, need me to work her in tomorrow or is Wednesday ok?

## 2017-04-19 NOTE — Telephone Encounter (Signed)
[facility] called, they are seeing patient s/p left TKA on 03/12/2017.  She has developed a severe pain in the left ankle, and at the proximal lower leg at the distal patella.  Can you call Anne Ng to discuss?  Or call patient either one?  Patient # [id number], patient does have an appt Wed. 845 am.  Please call thanks.

## 2017-04-19 NOTE — Telephone Encounter (Signed)
Work her in for Wednesday

## 2017-04-20 DIAGNOSIS — M810 Age-related osteoporosis without current pathological fracture: Secondary | ICD-10-CM | POA: Diagnosis not present

## 2017-04-20 DIAGNOSIS — E1142 Type 2 diabetes mellitus with diabetic polyneuropathy: Secondary | ICD-10-CM | POA: Diagnosis not present

## 2017-04-20 DIAGNOSIS — I2510 Atherosclerotic heart disease of native coronary artery without angina pectoris: Secondary | ICD-10-CM | POA: Diagnosis not present

## 2017-04-20 DIAGNOSIS — I5020 Unspecified systolic (congestive) heart failure: Secondary | ICD-10-CM | POA: Diagnosis not present

## 2017-04-20 DIAGNOSIS — I130 Hypertensive heart and chronic kidney disease with heart failure and stage 1 through stage 4 chronic kidney disease, or unspecified chronic kidney disease: Secondary | ICD-10-CM | POA: Diagnosis not present

## 2017-04-20 DIAGNOSIS — M109 Gout, unspecified: Secondary | ICD-10-CM | POA: Diagnosis not present

## 2017-04-20 DIAGNOSIS — M797 Fibromyalgia: Secondary | ICD-10-CM | POA: Diagnosis not present

## 2017-04-20 DIAGNOSIS — G4733 Obstructive sleep apnea (adult) (pediatric): Secondary | ICD-10-CM | POA: Diagnosis not present

## 2017-04-20 DIAGNOSIS — J449 Chronic obstructive pulmonary disease, unspecified: Secondary | ICD-10-CM | POA: Diagnosis not present

## 2017-04-20 DIAGNOSIS — Z471 Aftercare following joint replacement surgery: Secondary | ICD-10-CM | POA: Diagnosis not present

## 2017-04-20 DIAGNOSIS — I5032 Chronic diastolic (congestive) heart failure: Secondary | ICD-10-CM | POA: Diagnosis not present

## 2017-04-20 DIAGNOSIS — M5136 Other intervertebral disc degeneration, lumbar region: Secondary | ICD-10-CM | POA: Diagnosis not present

## 2017-04-20 NOTE — Telephone Encounter (Signed)
Patient has appt Wednesday, we will see her then. I told her to ice and elevate until that time

## 2017-04-21 ENCOUNTER — Ambulatory Visit (INDEPENDENT_AMBULATORY_CARE_PROVIDER_SITE_OTHER): Payer: Medicare Other

## 2017-04-21 ENCOUNTER — Other Ambulatory Visit (INDEPENDENT_AMBULATORY_CARE_PROVIDER_SITE_OTHER): Payer: Self-pay

## 2017-04-21 ENCOUNTER — Encounter (INDEPENDENT_AMBULATORY_CARE_PROVIDER_SITE_OTHER): Payer: Self-pay | Admitting: Orthopaedic Surgery

## 2017-04-21 ENCOUNTER — Ambulatory Visit (INDEPENDENT_AMBULATORY_CARE_PROVIDER_SITE_OTHER): Payer: Medicare Other | Admitting: Orthopaedic Surgery

## 2017-04-21 DIAGNOSIS — M79642 Pain in left hand: Secondary | ICD-10-CM | POA: Diagnosis not present

## 2017-04-21 DIAGNOSIS — M25572 Pain in left ankle and joints of left foot: Secondary | ICD-10-CM

## 2017-04-21 DIAGNOSIS — Z96652 Presence of left artificial knee joint: Secondary | ICD-10-CM | POA: Diagnosis not present

## 2017-04-21 NOTE — Progress Notes (Signed)
Office Visit Note   Patient: Tammy Strickland           Date of Birth: 1960/12/21           MRN: [id number] Visit Date: 04/21/2017              Requested by: Binnie Rail, MD [place], [place] [id number] PCP: Binnie Rail, MD   Assessment & Plan: Visit Diagnoses:  1. Pain in left ankle and joints of left foot   2. Pain in left hand   3. History of left knee replacement     Plan: I do feel she would benefit now from outpatient physical therapy to work on conditioning as well as continued strengthening of her left knee and range of motion.  I gave her reassurance that there is nothing broken with the left wrist or left ankle but will try local risk for the left wrist and ASO for the left ankle.  We will set her up for outpatient physical therapy as well.  I will see her back in a month see how she is doing overall.  Follow-Up Instructions: Return in about 4 weeks (around 05/19/2017).   Orders:  Orders Placed This Encounter  Procedures  . XR Hand Complete Left  . XR Ankle Complete Left  . XR Knee 1-2 Views Left   No orders of the defined types were placed in this encounter.     Procedures: No procedures performed   Clinical Data: No additional findings.   Subjective: Chief Complaint  Patient presents with  . Left Knee - Pain  . Left Hand - Pain  . Left Ankle - Pain  The patient is in postoperative follow-up status post a left total knee arthroplasty done about 40 days ago.  However she is to be seen for her left ankle and her left wrist today after an acute mechanical fall.  She says her left wrist is hurting her quite a bit as well as her left ankle.  She is walking with a rolling walker.  She is been having home health therapy and is ready to transition to outpatient physical therapy.  She is also in chronic pain management and they have helped her medications.  HPI  Review of Systems  She currently denies any headache, chest pain, shortness of breath,  fever, chills, nausea, vomiting. Objective: Vital Signs: There were no vitals taken for this visit.  Physical Exam She is alert and oriented x3 and in no acute distress Ortho Exam Examination of her left wrist shows full range of motion with pain with extremes of palmar flexion dorsiflexion of the wrist is more in the base of the thumb and into the anatomic snuffbox area.  Her hand is well-perfused and the motion is normal but painful.  Examination of her left knee shows almost full extension to past 90 degrees of flexion which is good considering she is morbidly obese.  Other than pain with her left ankle motion her ankle stable and has full motion.  There is no gross deformities about the ankle. Specialty Comments:  No specialty comments available.  Imaging: Xr Ankle Complete Left  Result Date: 04/21/2017 3 views of the left ankle show well located ankle with no acute findings.  There is no evidence of fracture or ankle joint effusion.  Xr Knee 1-2 Views Left  Result Date: 04/21/2017 2 views of the left knee show well-seated total knee arthroplasty with no complicating features.  Xr Hand  Complete Left  Result Date: 04/21/2017 3 views of the left hand to include the wrist show no evidence of fracture dislocation or malalignment or acute findings otherwise.    PMFS History: Patient Active Problem List   Diagnosis Date Noted  . History of left knee replacement 04/21/2017  . Pain in left ankle and joints of left foot 04/21/2017  . Pain in left hand 04/21/2017  . Status post total left knee replacement 03/12/2017  . Infected cyst of skin 02/26/2017  . Vulvar candidiasis 02/26/2017  . URI (upper respiratory infection) 02/26/2017  . Oral abscess 02/26/2017  . OSA (obstructive sleep apnea) 02/23/2017  . Acute gouty arthritis 12/25/2016  . Stroke ([place])   . Chronic fatigue 11/09/2016  . Hypoxia 11/09/2016  . Morbidly obese ([place]) 11/09/2016  . RLQ abdominal pain 07/21/2016  . Rash  07/21/2016  . Itching 07/21/2016  . Nonintractable headache 07/21/2016  . Dysuria 07/21/2016  . Allergic rhinitis 07/16/2016  . Vitamin D deficiency 04/16/2016  . Encounter for chronic pain management 03/12/2016  . Hyperlipidemia 01/15/2016  . Gait disorder 12/04/2015  . Diabetes ([place]) 11/26/2015  . Chronic diastolic congestive heart failure ([place]) 11/06/2015  . TIA (transient ischemic attack)   . Carotid-cavernous fistula   . Chest pain 10/31/2015  . Insomnia 10/10/2015  . Depression 10/10/2015  . Benign essential HTN 08/15/2015  . Fibromyalgia 08/15/2015  . Peripheral neuropathy 08/15/2015  . CAD (coronary artery disease) 08/15/2015  . COPD (chronic obstructive pulmonary disease) ([place]) 08/15/2015  . Osteoporosis 08/15/2015  . 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 DDD (degenerative disc disease), lumbar 08/15/2015  . Occupational exposure to industrial toxins 08/15/2015  . GERD (gastroesophageal reflux disease) 08/15/2015   Past Medical History:  Diagnosis Date  . Anxiety   . Arthritis   . CHF (congestive heart failure) ([place])   . COPD (chronic obstructive pulmonary disease) ([place])   . Coronary artery disease   . Depression   . Diabetes mellitus without complication ([place])    type 2   . GERD (gastroesophageal reflux disease)   . Hypertension   . Lupus   . Neuromuscular disorder ([place])   . Osteoporosis   . Oxygen deficiency    pt uses 2.5L 02 at night   . Peripheral neuropathy   . Sleep apnea    had sleep study done recently ; unaware if she will be getting  a CPAP device ; patient states "im pretty sure i have it , i fall alseep all the time "  . Stroke [facility]) 10/2015    Family History  Problem Relation Age of Onset  . Hyperlipidemia Mother   . Hypertension Mother   . Hyperlipidemia Father   . Hypertension Father   . Stroke Father   . Hypertension Sister   . Cancer Sister        breast cancer  . Crohn's disease Sister     Past Surgical History:  Procedure Laterality  Date  . ABDOMINAL HYSTERECTOMY     ovaries left  . APPENDECTOMY    . CHOLECYSTECTOMY    . IR GENERIC HISTORICAL  11/07/2015   IR ANGIO INTRA EXTRACRAN SEL COM CAROTID INNOMINATE BILAT MOD SED 11/07/2015 Luanne Bras, MD [facility]  . IR GENERIC HISTORICAL  11/07/2015   IR ANGIO VERTEBRAL SEL SUBCLAVIAN INNOMINATE UNI R MOD SED 11/07/2015 Luanne Bras, MD [facility]  . IR GENERIC HISTORICAL  11/07/2015   IR ANGIO VERTEBRAL SEL VERTEBRAL UNI L MOD SED 11/07/2015 Luanne Bras, MD [facility]  . IR GENERIC HISTORICAL  11/07/2015   IR ANGIOGRAM EXTREMITY LEFT 11/07/2015 Luanne Bras, MD [facility]  . TONSILLECTOMY    . TOTAL KNEE ARTHROPLASTY Left 03/12/2017   Procedure: LEFT TOTAL KNEE ARTHROPLASTY;  Surgeon: Mcarthur Rossetti, MD;  Location: [facility];  Service: Orthopedics;  Laterality: Left;  Adductor Block   Social History   Occupational History  . 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 Smoking status: Current Every Day Smoker    Packs/day: 0.25    Years: 35.00    Pack years: 8.75    Types: Cigarettes  . Smokeless tobacco: Never Used  . Tobacco comment: referred  to smoking  cessation  classes. at  [facility]   Substance and Sexual Activity  . Alcohol use: No    Alcohol/week: 1.2 - 1.8 oz    Types: 2 - 3 Standard drinks or equivalent per week  . 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   Marland Kitchen Sexual activity: Yes    Partners: Female

## 2017-04-22 ENCOUNTER — Telehealth: Payer: Self-pay | Admitting: Emergency Medicine

## 2017-04-22 DIAGNOSIS — M797 Fibromyalgia: Secondary | ICD-10-CM | POA: Diagnosis not present

## 2017-04-22 DIAGNOSIS — I130 Hypertensive heart and chronic kidney disease with heart failure and stage 1 through stage 4 chronic kidney disease, or unspecified chronic kidney disease: Secondary | ICD-10-CM | POA: Diagnosis not present

## 2017-04-22 DIAGNOSIS — J449 Chronic obstructive pulmonary disease, unspecified: Secondary | ICD-10-CM | POA: Diagnosis not present

## 2017-04-22 DIAGNOSIS — I5032 Chronic diastolic (congestive) heart failure: Secondary | ICD-10-CM | POA: Diagnosis not present

## 2017-04-22 DIAGNOSIS — I2510 Atherosclerotic heart disease of native coronary artery without angina pectoris: Secondary | ICD-10-CM | POA: Diagnosis not present

## 2017-04-22 DIAGNOSIS — M5136 Other intervertebral disc degeneration, lumbar region: Secondary | ICD-10-CM | POA: Diagnosis not present

## 2017-04-22 DIAGNOSIS — Z471 Aftercare following joint replacement surgery: Secondary | ICD-10-CM | POA: Diagnosis not present

## 2017-04-22 DIAGNOSIS — E1142 Type 2 diabetes mellitus with diabetic polyneuropathy: Secondary | ICD-10-CM | POA: Diagnosis not present

## 2017-04-22 DIAGNOSIS — G4733 Obstructive sleep apnea (adult) (pediatric): Secondary | ICD-10-CM | POA: Diagnosis not present

## 2017-04-22 DIAGNOSIS — M810 Age-related osteoporosis without current pathological fracture: Secondary | ICD-10-CM | POA: Diagnosis not present

## 2017-04-22 DIAGNOSIS — I5020 Unspecified systolic (congestive) heart failure: Secondary | ICD-10-CM | POA: Diagnosis not present

## 2017-04-22 DIAGNOSIS — M109 Gout, unspecified: Secondary | ICD-10-CM | POA: Diagnosis not present

## 2017-04-22 NOTE — Telephone Encounter (Signed)
Copied from [place]. Topic: Inquiry >> Apr 22, 2017  9:35 AM Ether Griffins B wrote: Reason for CRM: Jeani Hawking with Specialty services calling stating she has faxed over a form called [phone] on 04/02/17. Needing it filled out and faxed to [facility] to dept health and human services so pt can have an independent assessment. Jeani Hawking is going to re fax that form over today and it needs to be faxed to [phone].  Lynn's contact number [phone]

## 2017-04-22 NOTE — Telephone Encounter (Signed)
LVM with Jeani Hawking, these papers were faxed to both [facility] and Dept of Health

## 2017-04-23 ENCOUNTER — Encounter: Payer: Self-pay | Admitting: Podiatry

## 2017-04-23 ENCOUNTER — Ambulatory Visit (INDEPENDENT_AMBULATORY_CARE_PROVIDER_SITE_OTHER): Payer: Medicare Other | Admitting: Podiatry

## 2017-04-23 DIAGNOSIS — J449 Chronic obstructive pulmonary disease, unspecified: Secondary | ICD-10-CM | POA: Diagnosis not present

## 2017-04-23 DIAGNOSIS — I2510 Atherosclerotic heart disease of native coronary artery without angina pectoris: Secondary | ICD-10-CM | POA: Diagnosis not present

## 2017-04-23 DIAGNOSIS — I5020 Unspecified systolic (congestive) heart failure: Secondary | ICD-10-CM | POA: Diagnosis not present

## 2017-04-23 DIAGNOSIS — I5032 Chronic diastolic (congestive) heart failure: Secondary | ICD-10-CM | POA: Diagnosis not present

## 2017-04-23 DIAGNOSIS — E1142 Type 2 diabetes mellitus with diabetic polyneuropathy: Secondary | ICD-10-CM | POA: Diagnosis not present

## 2017-04-23 DIAGNOSIS — L84 Corns and callosities: Secondary | ICD-10-CM

## 2017-04-23 DIAGNOSIS — M109 Gout, unspecified: Secondary | ICD-10-CM | POA: Diagnosis not present

## 2017-04-23 DIAGNOSIS — B351 Tinea unguium: Secondary | ICD-10-CM | POA: Diagnosis not present

## 2017-04-23 DIAGNOSIS — Z471 Aftercare following joint replacement surgery: Secondary | ICD-10-CM | POA: Diagnosis not present

## 2017-04-23 DIAGNOSIS — M5136 Other intervertebral disc degeneration, lumbar region: Secondary | ICD-10-CM | POA: Diagnosis not present

## 2017-04-23 DIAGNOSIS — G4733 Obstructive sleep apnea (adult) (pediatric): Secondary | ICD-10-CM | POA: Diagnosis not present

## 2017-04-23 DIAGNOSIS — M810 Age-related osteoporosis without current pathological fracture: Secondary | ICD-10-CM | POA: Diagnosis not present

## 2017-04-23 DIAGNOSIS — I130 Hypertensive heart and chronic kidney disease with heart failure and stage 1 through stage 4 chronic kidney disease, or unspecified chronic kidney disease: Secondary | ICD-10-CM | POA: Diagnosis not present

## 2017-04-23 DIAGNOSIS — E1149 Type 2 diabetes mellitus with other diabetic neurological complication: Secondary | ICD-10-CM | POA: Diagnosis not present

## 2017-04-23 DIAGNOSIS — M79675 Pain in left toe(s): Secondary | ICD-10-CM | POA: Diagnosis not present

## 2017-04-23 DIAGNOSIS — M79674 Pain in right toe(s): Secondary | ICD-10-CM | POA: Diagnosis not present

## 2017-04-23 DIAGNOSIS — M797 Fibromyalgia: Secondary | ICD-10-CM | POA: Diagnosis not present

## 2017-04-26 ENCOUNTER — Other Ambulatory Visit: Payer: Self-pay | Admitting: Internal Medicine

## 2017-04-26 NOTE — Progress Notes (Signed)
Subjective: 57 y.o. returns the office today for painful, elongated, thickened toenails which she cannot trim herself. Denies any redness or drainage around the nails.  She also states that she would like me to try to get some of the dead skin off of her heels.  She recently just had a total knee replacement on the left side and could not reach her feet.  Denies any acute changes since last appointment and no new complaints today. Denies any systemic complaints such as fevers, chills, nausea, vomiting.   Objective: AAO 3, NAD DP/PT pulses palpable, CRT less than 3 seconds Protective sensation decreased with Simms Weinstein monofilament Nails hypertrophic, dystrophic, elongated, brittle, discolored 10. There is tenderness overlying the nails 1-5 bilaterally. There is incurvation of both corners of bilateral hallux toenails. There is no surrounding erythema or drainage along the nail sites. Dry, hyperkeratotic tissue bilateral heels.  No underlying ulceration, drainage or any signs of infection. No open lesions or other pre-ulcerative lesions are identified. Dry skin is present No other areas of tenderness bilateral lower extremities. No overlying edema, erythema, increased warmth. No pain with calf compression, swelling, warmth, erythema.  Assessment: Patient presents with symptomatic onychomycosis; hyperkeratotic lesions bilateral heels  Plan: -Treatment options including alternatives, risks, complications were discussed -Nails sharply debrided 10 without complication.  Minimal amount of bleeding occurred during removal of part of the nail on the medial corner were significantly incurvated on the left hallux.  Area was cleaned.  There is no drainage or pus.  Antibiotic ointment and a bandage daily.  Monitor for any signs or symptoms of infection. -Hyperkeratotic lesions were sharply debrided x2 without any complications or bleeding -She was inquired on toenail removal however given her recent  total knee replacement clinical off on that at this time. -Moisturizer to the feet daily discussed. Do not apply interdigitally -Discussed daily foot inspection. If there are any changes, to call the office immediately.  -Follow-up in 3 months or sooner if any problems are to arise. In the meantime, encouraged to call the office with any questions, concerns, changes symptoms.  Celesta Gentile, DPM

## 2017-04-27 ENCOUNTER — Ambulatory Visit: Payer: Self-pay | Admitting: Internal Medicine

## 2017-04-27 DIAGNOSIS — M791 Myalgia, unspecified site: Secondary | ICD-10-CM | POA: Diagnosis not present

## 2017-04-28 NOTE — Progress Notes (Signed)
Subjective:    Patient ID: Tammy Strickland, female    DOB: [birth year], 57 y.o.   MRN: [id number]  HPI The patient is here for follow up.  Diabetes: She is taking her medication daily as prescribed. She is compliant with a diabetic diet. She is eating less. She is not exercising regularly. She monitors her sugars and they have been running 120-170.    Hypertension: She is taking her medication daily. She is compliant with a low sodium diet.  She denies chest pain, palpitations, shortness of breath and regular headaches. She is not exercising regularly.  She does not monitor her blood pressure at home.    Morbid obesity:  She has cut back on how much she is eating.  She is trying to eat healthy.  She will start exercising soon with PT and wants starting working out at the gym.    Chronic back pain, s/p left knee replacement:  She did well with knee surgery.  She is following with pain management.  She will start PT for her knee soon for her left knee.    Insomnia:  She is taking trazodone nightly.  She also takes amitriptyline at night.  She is sleeping well.    Food getting stuck when swallowing:  She denies GERD symptoms.  She has blood blisters under her tongue. The tongue does not hurt.  She is currently taking Xarelto and aspirin.  Xarelto is secondary to her recent knee surgery.   Medications and allergies reviewed with patient and updated if appropriate.  Patient Active Problem List   Diagnosis Date Noted  . History of left knee replacement 04/21/2017  . Pain in left ankle and joints of left foot 04/21/2017  . Pain in left hand 04/21/2017  . Status post total left knee replacement 03/12/2017  . Infected cyst of skin 02/26/2017  . Vulvar candidiasis 02/26/2017  . Oral abscess 02/26/2017  . OSA (obstructive sleep apnea) 02/23/2017  . Acute gouty arthritis 12/25/2016  . Stroke ([place])   . Chronic fatigue 11/09/2016  . Hypoxia 11/09/2016  . Morbidly obese ([place]) 11/09/2016  .  RLQ abdominal pain 07/21/2016  . Rash 07/21/2016  . Nonintractable headache 07/21/2016  . Allergic rhinitis 07/16/2016  . Vitamin D deficiency 04/16/2016  . Encounter for chronic pain management 03/12/2016  . Hyperlipidemia 01/15/2016  . Gait disorder 12/04/2015  . Diabetes ([place]) 11/26/2015  . Chronic diastolic congestive heart failure ([place]) 11/06/2015  . TIA (transient ischemic attack)   . Carotid-cavernous fistula   . Chest pain 10/31/2015  . Insomnia 10/10/2015  . Depression 10/10/2015  . Benign essential HTN 08/15/2015  . Fibromyalgia 08/15/2015  . Peripheral neuropathy 08/15/2015  . CAD (coronary artery disease) 08/15/2015  . COPD (chronic obstructive pulmonary disease) ([place]) 08/15/2015  . Osteoporosis 08/15/2015  . 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 DDD (degenerative disc disease), lumbar 08/15/2015  . Occupational exposure to industrial toxins 08/15/2015  . GERD (gastroesophageal reflux disease) 08/15/2015    Current Outpatient Medications on File Prior to Visit  Medication Sig Dispense Refill  . AMITIZA 8 MCG capsule Take 1 capsule by mouth 2 (two) times daily.  0  . amitriptyline (ELAVIL) 25 MG tablet TAKE TWO TABLETS BY MOUTH AT BEDTIME (Patient taking differently: TAKE 50 MG BY MOUTH AT BEDTIME) 180 tablet 0  . aspirin EC 81 MG tablet Take 1 tablet (81 mg total) by mouth daily.    Marland Kitchen atorvastatin (LIPITOR) 20 MG tablet TAKE 1 TABLET(20 MG) BY MOUTH DAILY  90 tablet 1  . cloNIDine (CATAPRES) 0.3 MG tablet Take 1 tablet (0.3 mg total) by mouth daily. 90 tablet 1  . Dulaglutide (TRULICITY) 1.5 AT/5.5DD SOPN Inject 1.5 mg into the skin once a week. (Patient taking differently: Inject 1.5 mg every Sunday into the skin. ) 12 pen 1  . fluticasone (FLONASE) 50 MCG/ACT nasal spray SHAKE LIQUID AND INSTILL 1 SPRAY INTO EACH NOSTRIL ONCE DAILY AS NEEDED FOR ALLERGIES 16 g 11  . furosemide (LASIX) 80 MG tablet Take 1 tablet (80 mg total) by mouth daily. 90 tablet 1  . gabapentin  (NEURONTIN) 600 MG tablet Take 2 tablets (1,200 mg total) by mouth 3 (three) times daily. 540 tablet 1  . glucose blood test strip Use as instructed to check sugar three times a day 200 each 5  . Ipratropium-Albuterol (COMBIVENT) 20-100 MCG/ACT AERS respimat Inhale 1 puff into the lungs every 6 (six) hours. (Patient taking differently: Inhale 1 puff every 6 (six) hours as needed into the lungs for wheezing or shortness of breath. ) 3 Inhaler 1  . Lancets MISC Use as directed twice per day 200 each 5  . losartan (COZAAR) 50 MG tablet Take 1 tablet (50 mg total) by mouth 2 (two) times daily. 180 tablet 1  . metFORMIN (GLUCOPHAGE-XR) 500 MG 24 hr tablet TAKE 2 TABLETS BY MOUTH 2  TIMES DAILY WITH A MEAL. (Patient taking differently: Take 500 mg 2 (two) times daily by mouth. ) 360 tablet 1  . methocarbamol (ROBAXIN) 500 MG tablet Take 1 tablet (500 mg total) by mouth every 6 (six) hours as needed for muscle spasms. 60 tablet 0  . metoprolol (TOPROL-XL) 200 MG 24 hr tablet Take 1 tablet (200 mg total) by mouth daily. (Patient taking differently: Take 200 mg 2 (two) times daily by mouth. ) 90 tablet 1  . nitroGLYCERIN (NITROSTAT) 0.4 MG SL tablet Place 0.4 mg under the tongue every 5 (five) minutes as needed for chest pain.    Marland Kitchen omeprazole (PRILOSEC) 20 MG capsule Take 1 capsule (20 mg total) by mouth daily. (Patient taking differently: Take 20 mg daily as needed by mouth (for acid reflux). ) 90 capsule 1  . oxyCODONE (OXYCONTIN) 15 mg 12 hr tablet Take 15 mg by mouth every 6 (six) hours.    . potassium chloride SA (K-DUR,KLOR-CON) 20 MEQ tablet Take 1 tablet (20 mEq total) by mouth daily. 90 tablet 1  . rivaroxaban (XARELTO) 10 MG TABS tablet Take 1 tablet (10 mg total) by mouth daily with breakfast. 15 tablet 0  . tiZANidine (ZANAFLEX) 4 MG tablet TAKE 2 AND ONE-HALF TABLET BY MOUTH 3 TIMES A DAY (Patient taking differently: TAKE 10 MG BY MOUTH 3 TIMES A DAY) 225 tablet 5  . escitalopram (LEXAPRO) 10 MG  tablet Take 1 tablet (10 mg total) by mouth daily. 90 tablet 3   No current facility-administered medications on file prior to visit.     Past Medical History:  Diagnosis Date  . Anxiety   . Arthritis   . CHF (congestive heart failure) ([place])   . COPD (chronic obstructive pulmonary disease) ([place])   . Coronary artery disease   . Depression   . Diabetes mellitus without complication ([place])    type 2   . GERD (gastroesophageal reflux disease)   . Hypertension   . Lupus   . Neuromuscular disorder ([place])   . Osteoporosis   . Oxygen deficiency    pt uses 2.5L 02 at night   .  Peripheral neuropathy   . Sleep apnea    had sleep study done recently ; unaware if she will be getting  a CPAP device ; patient states "im pretty sure i have it , i fall alseep all the time "  . Stroke [facility]) 10/2015    Past Surgical History:  Procedure Laterality Date  . ABDOMINAL HYSTERECTOMY     ovaries left  . APPENDECTOMY    . CHOLECYSTECTOMY    . IR GENERIC HISTORICAL  11/07/2015   IR ANGIO INTRA EXTRACRAN SEL COM CAROTID INNOMINATE BILAT MOD SED 11/07/2015 Luanne Bras, MD [facility]  . IR GENERIC HISTORICAL  11/07/2015   IR ANGIO VERTEBRAL SEL SUBCLAVIAN INNOMINATE UNI R MOD SED 11/07/2015 Luanne Bras, MD [facility]  . IR GENERIC HISTORICAL  11/07/2015   IR ANGIO VERTEBRAL SEL VERTEBRAL UNI L MOD SED 11/07/2015 Luanne Bras, MD [facility]  . IR GENERIC HISTORICAL  11/07/2015   IR ANGIOGRAM EXTREMITY LEFT 11/07/2015 Luanne Bras, MD [facility]  . TONSILLECTOMY    . TOTAL KNEE ARTHROPLASTY Left 03/12/2017   Procedure: LEFT TOTAL KNEE ARTHROPLASTY;  Surgeon: Mcarthur Rossetti, MD;  Location: [facility];  Service: Orthopedics;  Laterality: Left;  Adductor Block    Social History   Socioeconomic History  . Marital status: Single    Spouse name: n/a  . Number of children: 3  . Years of education: 12+  . Highest education level: None  Social Needs  . Financial resource  strain: None  . Food insecurity - worry: None  . Food insecurity - inability: None  . Transportation needs - medical: None  . Transportation needs - non-medical: None  Occupational History  . 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 Smoking status: Current Every Day Smoker    Packs/day: 0.25    Years: 35.00    Pack years: 8.75    Types: Cigarettes  . Smokeless tobacco: Never Used  . Tobacco comment: referred  to smoking  cessation  classes. at  [facility]   Substance and Sexual Activity  . Alcohol use: No    Alcohol/week: 1.2 - 1.8 oz    Types: 2 - 3 Standard drinks or equivalent per week  . 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   Marland Kitchen Sexual activity: Yes    Partners: Female  Other Topics Concern  . None  Social History Narrative   Moved to [place] from [place], [place] February 2017, to help her daughter.   Lives with her daughter.   Sons live in [place].   She reports that there were originally 17 children in her family (she is the youngest), the oldest are deceased, some prior to her birth, and she isn't sure which were female/female or how they died.    Family History  Problem Relation Age of Onset  . Hyperlipidemia Mother   . Hypertension Mother   . Hyperlipidemia Father   . Hypertension Father   . Stroke Father   . Hypertension Sister   . Cancer Sister        breast cancer  . Crohn's disease Sister     Review of Systems  Constitutional: Negative for chills and fever.  Respiratory: Positive for cough and wheezing. Negative for shortness of breath.   Cardiovascular: Positive for leg swelling (left > right). Negative for chest pain and palpitations.  Gastrointestinal: Negative for abdominal pain, constipation, diarrhea and  nausea.  Neurological: Negative for light-headedness and headaches.       Objective:   Vitals:   04/29/17 0931  BP: 104/62  Pulse: 67    Resp: 18  Temp: 97.6 F (36.4 C)  SpO2: 93%   Wt Readings from Last 3 Encounters:  04/29/17 (!) 305 lb (138.3 kg)  03/12/17 (!) 312 lb (141.5 kg)  03/09/17 (!) 312 lb (141.5 kg)   Body mass index is 47.06 kg/m.   Physical Exam    Constitutional: Appears well-developed and well-nourished. No distress.  sleepy during visit (awake since 2 am helping a friend that was in trouble) HENT:  Head: Normocephalic and atraumatic.  Neck: Neck supple. No tracheal deviation present. No thyromegaly present.  No cervical lymphadenopathy Cardiovascular: Normal rate, regular rhythm and normal heart sounds.   No murmur heard. No carotid bruit .  B/l LE edema L> > R Pulmonary/Chest: Effort normal and breath sounds normal. No respiratory distress. No has no wheezes. No rales.  Skin: Skin is warm and dry. Not diaphoretic.  Psychiatric: Normal mood and affect. Behavior is normal.      Assessment & Plan:    See Problem List for Assessment and Plan of chronic medical problems.

## 2017-04-28 NOTE — Patient Instructions (Addendum)
[facility]  [address]  [place], [place] [address]  Main: [phone]    Test(s) ordered today. Your results will be released to [facility] (or called to you) after review, usually within 72hours after test completion. If any changes need to be made, you will be notified at that same time.   Medications reviewed and updated.  Changes include stopping the trazodone.  Your prescription(s) have been submitted to your pharmacy. Please take as directed and contact our office if you believe you are having problem(s) with the medication(s).  A referral was ordered for the stomach doctors.   Please followup in 6 months

## 2017-04-29 ENCOUNTER — Other Ambulatory Visit: Payer: Self-pay | Admitting: Internal Medicine

## 2017-04-29 ENCOUNTER — Other Ambulatory Visit (INDEPENDENT_AMBULATORY_CARE_PROVIDER_SITE_OTHER): Payer: Medicare Other

## 2017-04-29 ENCOUNTER — Ambulatory Visit (INDEPENDENT_AMBULATORY_CARE_PROVIDER_SITE_OTHER): Payer: Medicare Other | Admitting: Internal Medicine

## 2017-04-29 ENCOUNTER — Encounter: Payer: Self-pay | Admitting: Internal Medicine

## 2017-04-29 VITALS — BP 104/62 | HR 67 | Temp 97.6°F | Resp 18 | Wt 305.0 lb

## 2017-04-29 DIAGNOSIS — I10 Essential (primary) hypertension: Secondary | ICD-10-CM

## 2017-04-29 DIAGNOSIS — R1310 Dysphagia, unspecified: Secondary | ICD-10-CM

## 2017-04-29 DIAGNOSIS — G4700 Insomnia, unspecified: Secondary | ICD-10-CM | POA: Diagnosis not present

## 2017-04-29 DIAGNOSIS — E119 Type 2 diabetes mellitus without complications: Secondary | ICD-10-CM | POA: Diagnosis not present

## 2017-04-29 DIAGNOSIS — E7849 Other hyperlipidemia: Secondary | ICD-10-CM

## 2017-04-29 LAB — CBC WITH DIFFERENTIAL/PLATELET
Basophils Absolute: 0.1 10*3/uL (ref 0.0–0.1)
Basophils Relative: 0.9 % (ref 0.0–3.0)
Eosinophils Absolute: 0.4 10*3/uL (ref 0.0–0.7)
Eosinophils Relative: 3.7 % (ref 0.0–5.0)
HCT: 39.8 % (ref 36.0–46.0)
Hemoglobin: 12.8 g/dL (ref 12.0–15.0)
Lymphocytes Relative: 45.7 % (ref 12.0–46.0)
Lymphs Abs: 4.6 10*3/uL — ABNORMAL HIGH (ref 0.7–4.0)
MCHC: 32.3 g/dL (ref 30.0–36.0)
MCV: 85.9 fl (ref 78.0–100.0)
Monocytes Absolute: 0.6 10*3/uL (ref 0.1–1.0)
Monocytes Relative: 6.1 % (ref 3.0–12.0)
Neutro Abs: 4.4 10*3/uL (ref 1.4–7.7)
Neutrophils Relative %: 43.6 % (ref 43.0–77.0)
Platelets: 384 10*3/uL (ref 150.0–400.0)
RBC: 4.63 Mil/uL (ref 3.87–5.11)
RDW: 15.3 % (ref 11.5–15.5)
WBC: 10 10*3/uL (ref 4.0–10.5)

## 2017-04-29 NOTE — Assessment & Plan Note (Addendum)
BP well controlled Current regimen effective and well tolerated Continue current medications at current doses  

## 2017-04-29 NOTE — Assessment & Plan Note (Signed)
Sleeping well On many medications that cause drowsiness D/c trazodone Continue amitriptyline at current dose

## 2017-04-29 NOTE — Assessment & Plan Note (Signed)
She is experiencing food getting stuck with swallowing Will refer to GI for further evaluation Denies GERD symptoms

## 2017-04-29 NOTE — Assessment & Plan Note (Signed)
Will start PT and regular exercise soon Has decreased food intake Continue weight loss efforts

## 2017-04-29 NOTE — Assessment & Plan Note (Signed)
Lab Results  Component Value Date   HGBA1C 6.8 (H) 03/09/2017   Controlled Continue current medication

## 2017-05-06 ENCOUNTER — Other Ambulatory Visit: Payer: Self-pay

## 2017-05-06 ENCOUNTER — Encounter (HOSPITAL_COMMUNITY): Payer: Self-pay | Admitting: Emergency Medicine

## 2017-05-06 ENCOUNTER — Emergency Department (HOSPITAL_COMMUNITY): Payer: Medicare Other

## 2017-05-06 ENCOUNTER — Emergency Department (HOSPITAL_COMMUNITY)
Admission: EM | Admit: 2017-05-06 | Discharge: 2017-05-06 | Disposition: A | Discharge status: Home / Self Care | Payer: Medicare Other | Attending: Emergency Medicine | Admitting: Emergency Medicine

## 2017-05-06 DIAGNOSIS — F17210 Nicotine dependence, cigarettes, uncomplicated: Secondary | ICD-10-CM | POA: Insufficient documentation

## 2017-05-06 DIAGNOSIS — M25562 Pain in left knee: Secondary | ICD-10-CM | POA: Insufficient documentation

## 2017-05-06 DIAGNOSIS — Z8673 Personal history of transient ischemic attack (TIA), and cerebral infarction without residual deficits: Secondary | ICD-10-CM | POA: Insufficient documentation

## 2017-05-06 DIAGNOSIS — Z7984 Long term (current) use of oral hypoglycemic drugs: Secondary | ICD-10-CM | POA: Diagnosis not present

## 2017-05-06 DIAGNOSIS — Z7982 Long term (current) use of aspirin: Secondary | ICD-10-CM | POA: Insufficient documentation

## 2017-05-06 DIAGNOSIS — I110 Hypertensive heart disease with heart failure: Secondary | ICD-10-CM | POA: Insufficient documentation

## 2017-05-06 DIAGNOSIS — E1140 Type 2 diabetes mellitus with diabetic neuropathy, unspecified: Secondary | ICD-10-CM | POA: Insufficient documentation

## 2017-05-06 DIAGNOSIS — I5032 Chronic diastolic (congestive) heart failure: Secondary | ICD-10-CM | POA: Diagnosis not present

## 2017-05-06 DIAGNOSIS — S8992XA Unspecified injury of left lower leg, initial encounter: Secondary | ICD-10-CM | POA: Diagnosis not present

## 2017-05-06 DIAGNOSIS — I2510 Atherosclerotic heart disease of native coronary artery without angina pectoris: Secondary | ICD-10-CM | POA: Diagnosis not present

## 2017-05-06 DIAGNOSIS — J449 Chronic obstructive pulmonary disease, unspecified: Secondary | ICD-10-CM | POA: Diagnosis not present

## 2017-05-06 DIAGNOSIS — R600 Localized edema: Secondary | ICD-10-CM | POA: Diagnosis not present

## 2017-05-06 DIAGNOSIS — Z96652 Presence of left artificial knee joint: Secondary | ICD-10-CM | POA: Insufficient documentation

## 2017-05-06 LAB — D-DIMER, QUANTITATIVE: D-Dimer, Quant: 1.67 ug/mL-FEU — ABNORMAL HIGH (ref 0.00–0.50)

## 2017-05-06 MED ORDER — ENOXAPARIN SODIUM 150 MG/ML ~~LOC~~ SOLN
1.0000 mg/kg | Freq: Once | SUBCUTANEOUS | Status: AC
  Administered 2017-05-06: 140 mg via SUBCUTANEOUS
  Filled 2017-05-06: qty 0.92

## 2017-05-06 NOTE — ED Provider Notes (Signed)
[facility] EMERGENCY DEPARTMENT Provider Note   CSN: [id number] Arrival date & time: 05/06/17  1426     History   Chief Complaint Chief Complaint  Patient presents with  . Knee Pain    L knee    HPI Tammy Strickland is a 57 y.o. female.  Patient with history of left knee replacement 02/2017. Yesterday she stumbled and fell backwards into a chair. She does not recall twisting or hitting her knee. She has developed anterior and lateral knee pain with mild edema. She has increased pain with ambulation.    Knee Pain   This is a new problem. The current episode started yesterday. The problem has been gradually worsening. The pain is present in the left knee. The pain is moderate.    Past Medical History:  Diagnosis Date  . Anxiety   . Arthritis   . CHF (congestive heart failure) ([place])   . COPD (chronic obstructive pulmonary disease) ([place])   . Coronary artery disease   . Depression   . Diabetes mellitus without complication ([place])    type 2   . GERD (gastroesophageal reflux disease)   . Hypertension   . Lupus   . Neuromuscular disorder ([place])   . Osteoporosis   . Oxygen deficiency    pt uses 2.5L 02 at night   . Peripheral neuropathy   . Sleep apnea    had sleep study done recently ; unaware if she will be getting  a CPAP device ; patient states "im pretty sure i have it , i fall alseep all the time "  . Stroke [facility]) 10/2015    Patient Active Problem List   Diagnosis Date Noted  . Dysphagia 04/29/2017  . History of left knee replacement 04/21/2017  . Pain in left ankle and joints of left foot 04/21/2017  . Pain in left hand 04/21/2017  . Status post total left knee replacement 03/12/2017  . Infected cyst of skin 02/26/2017  . OSA (obstructive sleep apnea) 02/23/2017  . Acute gouty arthritis 12/25/2016  . Stroke ([place])   . Chronic fatigue 11/09/2016  . Hypoxia 11/09/2016  . Morbidly obese ([place]) 11/09/2016  . RLQ abdominal pain 07/21/2016  .  Rash 07/21/2016  . Nonintractable headache 07/21/2016  . Allergic rhinitis 07/16/2016  . Vitamin D deficiency 04/16/2016  . Encounter for chronic pain management 03/12/2016  . Hyperlipidemia 01/15/2016  . Gait disorder 12/04/2015  . Diabetes ([place]) 11/26/2015  . Chronic diastolic congestive heart failure ([place]) 11/06/2015  . TIA (transient ischemic attack)   . Carotid-cavernous fistula   . Chest pain 10/31/2015  . Insomnia 10/10/2015  . Depression 10/10/2015  . Benign essential HTN 08/15/2015  . Fibromyalgia 08/15/2015  . Peripheral neuropathy 08/15/2015  . CAD (coronary artery disease) 08/15/2015  . COPD (chronic obstructive pulmonary disease) ([place]) 08/15/2015  . Osteoporosis 08/15/2015  . 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 DDD (degenerative disc disease), lumbar 08/15/2015  . Occupational exposure to industrial toxins 08/15/2015  . GERD (gastroesophageal reflux disease) 08/15/2015    Past Surgical History:  Procedure Laterality Date  . ABDOMINAL HYSTERECTOMY     ovaries left  . APPENDECTOMY    . CHOLECYSTECTOMY    . IR GENERIC HISTORICAL  11/07/2015   IR ANGIO INTRA EXTRACRAN SEL COM CAROTID INNOMINATE BILAT MOD SED 11/07/2015 Luanne Bras, MD [facility]  . IR GENERIC HISTORICAL  11/07/2015   IR ANGIO VERTEBRAL SEL SUBCLAVIAN INNOMINATE UNI R MOD SED 11/07/2015 Luanne Bras, MD [facility]  .  IR GENERIC HISTORICAL  11/07/2015   IR ANGIO VERTEBRAL SEL VERTEBRAL UNI L MOD SED 11/07/2015 Luanne Bras, MD [facility]  . IR GENERIC HISTORICAL  11/07/2015   IR ANGIOGRAM EXTREMITY LEFT 11/07/2015 Luanne Bras, MD [facility]  . TONSILLECTOMY    . TOTAL KNEE ARTHROPLASTY Left 03/12/2017   Procedure: LEFT TOTAL KNEE ARTHROPLASTY;  Surgeon: Mcarthur Rossetti, MD;  Location: [facility];  Service: Orthopedics;  Laterality: Left;  Adductor Block    OB History    No data available       Home Medications    Prior to Admission medications   Medication Sig Start  Date End Date Taking? Authorizing Provider  AMITIZA 8 MCG capsule Take 1 capsule by mouth 2 (two) times daily. 04/16/17   [provider]  amitriptyline (ELAVIL) 25 MG tablet TAKE TWO TABLETS BY MOUTH AT BEDTIME Patient taking differently: TAKE 50 MG BY MOUTH AT BEDTIME 02/25/17   Binnie Rail, MD  aspirin EC 81 MG tablet Take 1 tablet (81 mg total) by mouth daily. 11/01/15   Elgergawy, Silver Huguenin, MD  atorvastatin (LIPITOR) 20 MG tablet TAKE 1 TABLET(20 MG) BY MOUTH DAILY 01/20/17   Binnie Rail, MD  cloNIDine (CATAPRES) 0.3 MG tablet Take 1 tablet (0.3 mg total) by mouth daily. 02/02/17   Burns, Claudina Lick, MD  Dulaglutide (TRULICITY) 1.5 FT/7.3UK SOPN Inject 1.5 mg into the skin once a week. Patient taking differently: Inject 1.5 mg every Sunday into the skin.  11/03/16   Binnie Rail, MD  escitalopram (LEXAPRO) 10 MG tablet Take 1 tablet (10 mg total) by mouth daily. 12/25/16 03/25/17  Biagio Borg, MD  fluticasone (FLONASE) 50 MCG/ACT nasal spray SHAKE LIQUID AND INSTILL 1 SPRAY INTO EACH NOSTRIL ONCE DAILY AS NEEDED FOR ALLERGIES 02/15/17   Binnie Rail, MD  furosemide (LASIX) 80 MG tablet Take 1 tablet (80 mg total) by mouth daily. 01/28/17   Binnie Rail, MD  gabapentin (NEURONTIN) 600 MG tablet Take 2 tablets (1,200 mg total) by mouth 3 (three) times daily. 01/22/17   Binnie Rail, MD  glucose blood test strip Use as instructed to check sugar three times a day 01/28/17   Binnie Rail, MD  Ipratropium-Albuterol (COMBIVENT) 20-100 MCG/ACT AERS respimat Inhale 1 puff into the lungs every 6 (six) hours. Patient taking differently: Inhale 1 puff every 6 (six) hours as needed into the lungs for wheezing or shortness of breath.  01/28/17   Binnie Rail, MD  Lancets MISC Use as directed twice per day 01/28/17   Binnie Rail, MD  losartan (COZAAR) 50 MG tablet Take 1 tablet (50 mg total) by mouth 2 (two) times daily. 11/09/16   Binnie Rail, MD  metFORMIN (GLUCOPHAGE-XR) 500 MG 24  hr tablet TAKE 2 TABLETS BY MOUTH 2  TIMES DAILY WITH A MEAL. Patient taking differently: Take 500 mg 2 (two) times daily by mouth.  02/03/17   Binnie Rail, MD  methocarbamol (ROBAXIN) 500 MG tablet Take 1 tablet (500 mg total) by mouth every 6 (six) hours as needed for muscle spasms. 03/15/17   Mcarthur Rossetti, MD  metoprolol (TOPROL-XL) 200 MG 24 hr tablet Take 1 tablet (200 mg total) by mouth daily. Patient taking differently: Take 200 mg 2 (two) times daily by mouth.  01/28/17   Burns, Claudina Lick, MD  nitroGLYCERIN (NITROSTAT) 0.4 MG SL tablet Place 0.4 mg under the tongue every 5 (five) minutes as needed for  chest pain.    [provider]  omeprazole (PRILOSEC) 20 MG capsule Take 1 capsule (20 mg total) by mouth daily. Patient taking differently: Take 20 mg daily as needed by mouth (for acid reflux).  01/28/17   Binnie Rail, MD  oxyCODONE (OXYCONTIN) 15 mg 12 hr tablet Take 15 mg by mouth every 6 (six) hours.    [provider]  potassium chloride SA (K-DUR,KLOR-CON) 20 MEQ tablet Take 1 tablet (20 mEq total) by mouth daily. 01/28/17   Binnie Rail, MD  rivaroxaban (XARELTO) 10 MG TABS tablet Take 1 tablet (10 mg total) by mouth daily with breakfast. 03/15/17   Mcarthur Rossetti, MD  tiZANidine (ZANAFLEX) 4 MG tablet TAKE 2 AND ONE-HALF TABLET BY MOUTH 3 TIMES A DAY Patient taking differently: TAKE 10 MG BY MOUTH 3 TIMES A DAY 02/26/17   Binnie Rail, MD    Family History Family History  Problem Relation Age of Onset  . Hyperlipidemia Mother   . Hypertension Mother   . Hyperlipidemia Father   . Hypertension Father   . Stroke Father   . Hypertension Sister   . Cancer Sister        breast cancer  . Crohn's disease Sister     Social History Social History   Tobacco Use  . Smoking status: Current Every Day Smoker    Packs/day: 0.25    Years: 35.00    Pack years: 8.75    Types: Cigarettes  . Smokeless tobacco: Never Used  . Tobacco comment:  referred  to smoking  cessation  classes. at  [facility]   Substance Use Topics  . Alcohol use: No    Alcohol/week: 1.2 - 1.8 oz    Types: 2 - 3 Standard drinks or equivalent per week  . 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      Allergies   Patient has no known allergies.   Review of Systems Review of Systems  Musculoskeletal: Positive for arthralgias.  All other systems reviewed and are negative.    Physical Exam Updated Vital Signs BP 131/76 (BP Location: Right Arm)   Pulse 60   Temp 97.9 F (36.6 C) (Oral)   Resp 18   SpO2 100%   Physical Exam  Constitutional: She is oriented to person, place, and time. She appears well-developed and well-nourished.  HENT:  Head: Normocephalic.  Eyes: Conjunctivae are normal.  Neck: Neck supple.  Cardiovascular: Normal rate and regular rhythm.  Pulmonary/Chest: Effort normal and breath sounds normal.  Abdominal: Soft. Bowel sounds are normal.  Musculoskeletal: She exhibits tenderness. She exhibits no deformity.  Well healing surgical scar noted at anterior knee. Tenderness above the knee in the midline, along with tenderness and mild edema to the lateral aspect of the knee. Tenderness to the left calf.  Neurological: She is alert and oriented to person, place, and time.  Skin: Skin is warm and dry.  Psychiatric: She has a normal mood and affect.  Nursing note and vitals reviewed.    ED Treatments / Results  Labs (all labs ordered are listed, but only abnormal results are displayed) Labs Reviewed - No data to display  EKG  EKG Interpretation None       Radiology Dg Knee Complete 4 Views Left  Result Date: 05/06/2017 CLINICAL DATA:  Recent fall with pain, initial encounter EXAM: LEFT KNEE - COMPLETE 4+ VIEW COMPARISON:  03/22/2017 FINDINGS: Left knee replacement is again identified and stable. No acute  fracture or dislocation is noted. No joint effusion is seen. No other focal abnormality is noted. IMPRESSION: No acute  abnormality noted. Electronically Signed   By: Inez Catalina M.D.   On: 05/06/2017 17:55    Procedures Procedures (including critical care time)  Medications Ordered in ED Medications - No data to display   Initial Impression / Assessment and Plan / ED Course  I have reviewed the triage vital signs and the nursing notes.  Pertinent labs & imaging results that were available during my care of the patient were reviewed by me and considered in my medical decision making (see chart for details).     Patient X-Ray negative for obvious fracture or dislocation. However, exam is concerning for DVT and d-dimer is elevated. Unable to get vascular study at this time. Patient is to report to short stay in the morning for vascular ultrasound. Patient given lovenox in the ED. Patient will be discharged home & is agreeable with above plan. Returns precautions discussed. Pt appears safe for discharge.  Final Clinical Impressions(s) / ED Diagnoses   Final diagnoses:  Acute pain of left knee    ED Discharge Orders    None       Etta Quill, NP 05/06/17 2055    Charlesetta Shanks, MD 05/11/17 1740

## 2017-05-06 NOTE — ED Triage Notes (Signed)
Pt had knee surgery in Nov 2018 and pt stumbled the other day and fell into the couch. Pt now c/o L knee pain with pain during ambulation. Pt is worried knee cap has come out. Pt has good distal sensation and movement.

## 2017-05-06 NOTE — Discharge Instructions (Signed)
Report to the short stay area at [facility] tomorrow morning at [place], and let them know you are supposed to have a vascular ultrasound of your leg to evaluate for deep vein thrombosis.  If you are unable to arrive by 8AM, be sure to call the number listed.

## 2017-05-07 ENCOUNTER — Other Ambulatory Visit: Payer: Self-pay | Admitting: *Deleted

## 2017-05-07 ENCOUNTER — Ambulatory Visit (HOSPITAL_COMMUNITY)
Admission: RE | Admit: 2017-05-07 | Discharge: 2017-05-07 | Disposition: A | Discharge status: Home / Self Care | Payer: Medicare Other | Source: Ambulatory Visit | Attending: Nurse Practitioner | Admitting: Nurse Practitioner

## 2017-05-07 ENCOUNTER — Telehealth: Payer: Self-pay | Admitting: Internal Medicine

## 2017-05-07 DIAGNOSIS — R590 Localized enlarged lymph nodes: Secondary | ICD-10-CM | POA: Diagnosis not present

## 2017-05-07 DIAGNOSIS — M7989 Other specified soft tissue disorders: Secondary | ICD-10-CM | POA: Diagnosis not present

## 2017-05-07 DIAGNOSIS — M79605 Pain in left leg: Secondary | ICD-10-CM | POA: Insufficient documentation

## 2017-05-07 NOTE — Telephone Encounter (Signed)
LVM with Jeani Hawking advising forms have been faxed again.

## 2017-05-07 NOTE — Progress Notes (Signed)
CSW was able to make initial contact with patient today to perform CSW screening with [facility] patients with acuity level 3, 4 or 5 (patient had an acuity level of 4).  CSW introduced self, explained role and types of services provided through [place] [facility] ([facility]).  CSW further explained to patient that CSW wants to ensure that patient has all their needs met, prior to returning home.  CSW obtained two HIPAA compliant identifiers from patient, which included patient's name and date of birth.  The following CSW screening was performed: The reason for patient's visit to the emergency department was due to left knee pain. Patient states that she did not try to contact her primary care physician, Dr. Billey Gosling or the urgent care before coming to the emergency department. CSW provided patient with a [place] [facility] [place] including the "Know Before You Go", further explaining the program. Patient made aware that [facility] services does not interfere or replace home health or other community based services. Patient declined need for [facility] at this time.    Raynaldo Opitz, LCSW [facility]  Clinical Social Worker cell #: [phone]

## 2017-05-07 NOTE — Telephone Encounter (Signed)
Copied from [place] [phone]. Topic: General - Other >> May 07, 2017  1:33 PM Lolita Rieger, [place] wrote: Reason for CRM: Jeani Hawking from specialty services called and stated that Janeece Riggers did not receive the forms faxed earlier this month  Please re-fax [phone] please contact Jeani Hawking at [phone] or [phone]

## 2017-05-10 ENCOUNTER — Encounter (INDEPENDENT_AMBULATORY_CARE_PROVIDER_SITE_OTHER): Payer: Self-pay | Admitting: Physician Assistant

## 2017-05-10 ENCOUNTER — Ambulatory Visit (INDEPENDENT_AMBULATORY_CARE_PROVIDER_SITE_OTHER): Payer: Medicare Other | Admitting: Physician Assistant

## 2017-05-10 DIAGNOSIS — Z96652 Presence of left artificial knee joint: Secondary | ICD-10-CM

## 2017-05-10 NOTE — Telephone Encounter (Signed)
Spoke with Tammy Strickland, faxed paper work again, failed on Friday

## 2017-05-10 NOTE — Progress Notes (Signed)
HPI: [name] is a 41 days status post left total knee arthroplasty.  She was seen in the ER on 05/06/2017 due to a fall in which she twisted and hit her left knee.  She had pain of the anterior and lateral aspect of the knee with some swelling.  She underwent radiographs at that time and a Doppler of her left lower leg.  Personally reviewed the radiographs of her knee and these showed no acute findings she is status post a left total knee arthroplasty with well-seated components.  No dislocation of the knee.  Doppler was read as negative for DVT.  She reports now that she is still having some pain in the knee but overall is trending towards improvement.  She unfortunately did not get up with outpatient therapy to work on strengthening knee. Review of systems see HPI otherwise negative  Physical exam: General well-developed well-nourished female in no acute distress.  Ambulates without any assistive device. Left knee: Full extension flexes to approximately 105 degrees.  No instability valgus varus stressing.  Surgical incisions healing well with no signs of infection.  Left calf supple nontender.  Impression: Status post left total knee arthroplasty  Plan: We will given her the phone number to [facility] she is to contact them as they tried to contact her with which in the computer states that they left a message.  She will get into therapy and start working on range of motion strengthening knee.  We will see her back in a month check progress lack of.  Questions encouraged and answered

## 2017-05-11 DIAGNOSIS — M25562 Pain in left knee: Secondary | ICD-10-CM | POA: Diagnosis not present

## 2017-05-11 DIAGNOSIS — G894 Chronic pain syndrome: Secondary | ICD-10-CM | POA: Diagnosis not present

## 2017-05-11 DIAGNOSIS — M545 Low back pain: Secondary | ICD-10-CM | POA: Diagnosis not present

## 2017-05-12 DIAGNOSIS — M791 Myalgia, unspecified site: Secondary | ICD-10-CM | POA: Diagnosis not present

## 2017-05-17 DIAGNOSIS — M25559 Pain in unspecified hip: Secondary | ICD-10-CM | POA: Diagnosis not present

## 2017-05-17 DIAGNOSIS — M79606 Pain in leg, unspecified: Secondary | ICD-10-CM | POA: Diagnosis not present

## 2017-05-17 DIAGNOSIS — M545 Low back pain: Secondary | ICD-10-CM | POA: Diagnosis not present

## 2017-05-17 DIAGNOSIS — Z79891 Long term (current) use of opiate analgesic: Secondary | ICD-10-CM | POA: Diagnosis not present

## 2017-05-17 DIAGNOSIS — G894 Chronic pain syndrome: Secondary | ICD-10-CM | POA: Diagnosis not present

## 2017-05-19 ENCOUNTER — Telehealth: Payer: Self-pay | Admitting: Internal Medicine

## 2017-05-19 ENCOUNTER — Ambulatory Visit (INDEPENDENT_AMBULATORY_CARE_PROVIDER_SITE_OTHER): Payer: Medicare Other | Admitting: Orthopaedic Surgery

## 2017-05-19 ENCOUNTER — Encounter (INDEPENDENT_AMBULATORY_CARE_PROVIDER_SITE_OTHER): Payer: Self-pay | Admitting: Orthopaedic Surgery

## 2017-05-19 DIAGNOSIS — Z96652 Presence of left artificial knee joint: Secondary | ICD-10-CM

## 2017-05-19 NOTE — Progress Notes (Signed)
The patient is now about 9 weeks status post a left total knee arthroplasty.  She is a 57 year old who is on chronic narcotic pain medications.  She had a fall recently and had x-rays of her knee on 05/06/2017.  The normal system for me to review.  She is actually doing well overall in terms of increasing her motion of her knee and working on strengthening of her knee.  On exam she has full extension of her left knee.  She can flex easily to 100 degrees and just beyond this.  The knee feels ligamentously stable.  X-rays reviewed of her knee independently from just last month shows a well-seated implant with no complicating features.  I let her know that I am extremely encouraged by how well she has done.  She is actually exceeded my expectations for someone who is young and on chronic pain medications sometimes it is very problematic getting patients to get their knee moving and bending.  She is doing excellent from that standpoint and is absolutely beyond where she should be and I encouraged that she is can even do better.  Her pain is appropriate for having the surgery only 8-9 weeks ago.  I will see her back in 3 months to see how she is doing overall.  I would like an AP and lateral of her left knee at that visit.  All questions concerns were answered and addressed.

## 2017-05-20 ENCOUNTER — Other Ambulatory Visit: Payer: Self-pay | Admitting: Internal Medicine

## 2017-05-20 ENCOUNTER — Ambulatory Visit (INDEPENDENT_AMBULATORY_CARE_PROVIDER_SITE_OTHER): Payer: Medicare Other | Admitting: Orthopaedic Surgery

## 2017-05-20 DIAGNOSIS — J42 Unspecified chronic bronchitis: Secondary | ICD-10-CM

## 2017-05-20 DIAGNOSIS — I10 Essential (primary) hypertension: Secondary | ICD-10-CM

## 2017-05-21 ENCOUNTER — Other Ambulatory Visit: Payer: Self-pay | Admitting: Internal Medicine

## 2017-05-21 DIAGNOSIS — I10 Essential (primary) hypertension: Secondary | ICD-10-CM

## 2017-05-26 NOTE — Telephone Encounter (Signed)
LVM with pt twice asking she call and verify her pharmacy. Refusing meds until we speak with pt.

## 2017-05-28 MED ORDER — AMITRIPTYLINE HCL 50 MG PO TABS: 50.0000 mg | ORAL_TABLET | Freq: Every day | ORAL | 1 refills | Status: DC

## 2017-05-28 NOTE — Telephone Encounter (Signed)
Ok to send in 50 mg quantity of 90

## 2017-05-28 NOTE — Telephone Encounter (Signed)
Updated rx and sent to [place].Marland KitchenJohny Chess

## 2017-05-28 NOTE — Telephone Encounter (Signed)
Where you meaning to send a 50 mg instead.. W/ quantity of 90.Marland KitchenJohny Chess

## 2017-05-28 NOTE — Addendum Note (Signed)
Addended by: Earnstine Regal on: 05/28/2017 10:51 AM   Modules accepted: Orders

## 2017-05-28 NOTE — Telephone Encounter (Signed)
CRM for notification. See Telephone encounter for:   05/28/17.  [place], [place] [phone] (Phone) [phone] (Fax)    Pharmacy in need of clarification regarding amitriptyline (ELAVIL) 25 MG tablet, pharmacy states 60 quanity for 30 days or 180 quantity for 90 days, please advise and send new Rx.  [facility] [phone]

## 2017-05-31 ENCOUNTER — Telehealth: Payer: Self-pay | Admitting: Internal Medicine

## 2017-05-31 ENCOUNTER — Other Ambulatory Visit: Payer: Self-pay

## 2017-05-31 DIAGNOSIS — I10 Essential (primary) hypertension: Secondary | ICD-10-CM

## 2017-05-31 MED ORDER — POTASSIUM CHLORIDE CRYS ER 20 MEQ PO TBCR: 20.0000 meq | EXTENDED_RELEASE_TABLET | Freq: Every day | ORAL | 1 refills | Status: DC

## 2017-05-31 MED ORDER — LOSARTAN POTASSIUM 50 MG PO TABS: 50.0000 mg | ORAL_TABLET | Freq: Two times a day (BID) | ORAL | 1 refills | Status: DC

## 2017-05-31 MED ORDER — METOPROLOL SUCCINATE ER 200 MG PO TB24: 200.0000 mg | ORAL_TABLET | Freq: Two times a day (BID) | ORAL | 1 refills | Status: DC

## 2017-05-31 MED ORDER — CLONIDINE HCL 0.3 MG PO TABS: 0.3000 mg | ORAL_TABLET | Freq: Every day | ORAL | 1 refills | Status: DC

## 2017-05-31 MED ORDER — FUROSEMIDE 80 MG PO TABS: 80.0000 mg | ORAL_TABLET | Freq: Every day | ORAL | 1 refills | Status: DC

## 2017-05-31 NOTE — Telephone Encounter (Signed)
Copied from [place]. Topic: Quick Communication - See Telephone Encounter >> May 31, 2017  9:54 AM Burnis Medin, NT wrote: CRM for notification. See Telephone encounter for: Tammy Strickland is calling to get clarification for losartan (COZAAR) 50 MG tablet , metoprolol (TOPROL-XL) 200 MG 24 hr tablet, cloNIDine (CATAPRES) 0.3 MG tablet and potassium chloride SA (K-DUR,KLOR-CON) 20 MEQ tablet.  She would like a call back and it's ok to speak to anyone in the pharmacy.  05/31/17.

## 2017-05-31 NOTE — Telephone Encounter (Signed)
Refills sent as requested

## 2017-06-02 ENCOUNTER — Other Ambulatory Visit: Payer: Self-pay | Admitting: Emergency Medicine

## 2017-06-02 MED ORDER — GABAPENTIN 600 MG PO TABS: 1200.0000 mg | ORAL_TABLET | Freq: Three times a day (TID) | ORAL | 0 refills | Status: DC

## 2017-06-06 DIAGNOSIS — J449 Chronic obstructive pulmonary disease, unspecified: Secondary | ICD-10-CM | POA: Diagnosis not present

## 2017-06-10 DIAGNOSIS — M791 Myalgia, unspecified site: Secondary | ICD-10-CM | POA: Diagnosis not present

## 2017-06-11 ENCOUNTER — Other Ambulatory Visit: Payer: Self-pay | Admitting: Internal Medicine

## 2017-06-14 ENCOUNTER — Ambulatory Visit (INDEPENDENT_AMBULATORY_CARE_PROVIDER_SITE_OTHER): Payer: Medicare Other | Admitting: Physician Assistant

## 2017-06-14 DIAGNOSIS — Z79891 Long term (current) use of opiate analgesic: Secondary | ICD-10-CM | POA: Diagnosis not present

## 2017-06-14 DIAGNOSIS — G894 Chronic pain syndrome: Secondary | ICD-10-CM | POA: Diagnosis not present

## 2017-06-14 DIAGNOSIS — M25559 Pain in unspecified hip: Secondary | ICD-10-CM | POA: Diagnosis not present

## 2017-06-14 DIAGNOSIS — M25562 Pain in left knee: Secondary | ICD-10-CM | POA: Diagnosis not present

## 2017-06-14 DIAGNOSIS — M545 Low back pain: Secondary | ICD-10-CM | POA: Diagnosis not present

## 2017-06-18 DIAGNOSIS — Z7982 Long term (current) use of aspirin: Secondary | ICD-10-CM

## 2017-06-18 DIAGNOSIS — E559 Vitamin D deficiency, unspecified: Secondary | ICD-10-CM

## 2017-06-18 DIAGNOSIS — Z7984 Long term (current) use of oral hypoglycemic drugs: Secondary | ICD-10-CM

## 2017-06-18 DIAGNOSIS — I5032 Chronic diastolic (congestive) heart failure: Secondary | ICD-10-CM | POA: Diagnosis not present

## 2017-06-18 DIAGNOSIS — Z8673 Personal history of transient ischemic attack (TIA), and cerebral infarction without residual deficits: Secondary | ICD-10-CM

## 2017-06-18 DIAGNOSIS — K219 Gastro-esophageal reflux disease without esophagitis: Secondary | ICD-10-CM

## 2017-06-18 DIAGNOSIS — I130 Hypertensive heart and chronic kidney disease with heart failure and stage 1 through stage 4 chronic kidney disease, or unspecified chronic kidney disease: Secondary | ICD-10-CM | POA: Diagnosis not present

## 2017-06-18 DIAGNOSIS — Z7901 Long term (current) use of anticoagulants: Secondary | ICD-10-CM

## 2017-06-18 DIAGNOSIS — G4733 Obstructive sleep apnea (adult) (pediatric): Secondary | ICD-10-CM | POA: Diagnosis not present

## 2017-06-18 DIAGNOSIS — M109 Gout, unspecified: Secondary | ICD-10-CM | POA: Diagnosis not present

## 2017-06-18 DIAGNOSIS — I5020 Unspecified systolic (congestive) heart failure: Secondary | ICD-10-CM | POA: Diagnosis not present

## 2017-06-18 DIAGNOSIS — E1142 Type 2 diabetes mellitus with diabetic polyneuropathy: Secondary | ICD-10-CM | POA: Diagnosis not present

## 2017-06-18 DIAGNOSIS — M810 Age-related osteoporosis without current pathological fracture: Secondary | ICD-10-CM | POA: Diagnosis not present

## 2017-06-18 DIAGNOSIS — I2510 Atherosclerotic heart disease of native coronary artery without angina pectoris: Secondary | ICD-10-CM | POA: Diagnosis not present

## 2017-06-18 DIAGNOSIS — M797 Fibromyalgia: Secondary | ICD-10-CM | POA: Diagnosis not present

## 2017-06-18 DIAGNOSIS — Z4789 Encounter for other orthopedic aftercare: Secondary | ICD-10-CM

## 2017-06-18 DIAGNOSIS — Z96652 Presence of left artificial knee joint: Secondary | ICD-10-CM

## 2017-06-18 DIAGNOSIS — Z471 Aftercare following joint replacement surgery: Secondary | ICD-10-CM | POA: Diagnosis not present

## 2017-06-18 DIAGNOSIS — Z9981 Dependence on supplemental oxygen: Secondary | ICD-10-CM

## 2017-06-18 DIAGNOSIS — F329 Major depressive disorder, single episode, unspecified: Secondary | ICD-10-CM

## 2017-06-18 DIAGNOSIS — F419 Anxiety disorder, unspecified: Secondary | ICD-10-CM

## 2017-06-18 DIAGNOSIS — Z6841 Body Mass Index (BMI) 40.0 and over, adult: Secondary | ICD-10-CM

## 2017-06-18 DIAGNOSIS — M5136 Other intervertebral disc degeneration, lumbar region: Secondary | ICD-10-CM | POA: Diagnosis not present

## 2017-06-18 DIAGNOSIS — J449 Chronic obstructive pulmonary disease, unspecified: Secondary | ICD-10-CM | POA: Diagnosis not present

## 2017-06-18 DIAGNOSIS — Z9181 History of falling: Secondary | ICD-10-CM

## 2017-06-25 ENCOUNTER — Other Ambulatory Visit: Payer: Self-pay | Admitting: Internal Medicine

## 2017-06-25 DIAGNOSIS — J42 Unspecified chronic bronchitis: Secondary | ICD-10-CM

## 2017-06-30 ENCOUNTER — Ambulatory Visit (INDEPENDENT_AMBULATORY_CARE_PROVIDER_SITE_OTHER): Payer: Medicare Other | Admitting: Physician Assistant

## 2017-06-30 ENCOUNTER — Ambulatory Visit (INDEPENDENT_AMBULATORY_CARE_PROVIDER_SITE_OTHER): Payer: Medicare Other

## 2017-06-30 ENCOUNTER — Encounter (INDEPENDENT_AMBULATORY_CARE_PROVIDER_SITE_OTHER): Payer: Self-pay | Admitting: Physician Assistant

## 2017-06-30 DIAGNOSIS — Z96652 Presence of left artificial knee joint: Secondary | ICD-10-CM | POA: Diagnosis not present

## 2017-06-30 DIAGNOSIS — M7062 Trochanteric bursitis, left hip: Secondary | ICD-10-CM

## 2017-06-30 MED ORDER — LIDOCAINE HCL 1 % IJ SOLN
3.0000 mL | INTRAMUSCULAR | Status: AC | PRN
  Administered 2017-06-30: 3 mL

## 2017-06-30 MED ORDER — METHYLPREDNISOLONE ACETATE 40 MG/ML IJ SUSP
40.0000 mg | INTRAMUSCULAR | Status: AC | PRN
  Administered 2017-06-30: 40 mg via INTRA_ARTICULAR

## 2017-06-30 NOTE — Progress Notes (Signed)
Tammy Strickland returns today status post left total knee arthroplasty 03/12/2017.  She states that her knee is killing her along the lateral side.  She has had no new injury.  She feels that something must be loose within the knee.  She denies any back pain.  She does have what she describes as numbness down the lateral aspect of the left knee.  Physical exam: General well-developed well-nourished female no acute distress mood affect appropriate. Left knee surgical incisions well-healed.  She has full flexion and slight hyperextension.  No instability valgus varus stressing of the knee.  Tenderness along the lateral aspect of the knee.  There is no abnormal warmth erythema or effusion in the knee calf supple nontender.  She has tenderness down the entire her IT band of the left thigh.  Good range of motion of the hip without pain.  Radiographs: Left knee: AP lateral views show no concave features status post left total knee arthroplasty.  No acute fractures.  No bony abnormalities  Impression: Status post left total knee arthroplasty 03/12/2017  Left trochanteric bursitis  Plan: She will continue work on range of motion of her left knee.  Also showed her some IT band stretching exercises.  Offered her left hip trochanteric injection she would like to proceed with this today.  Her diabetes is under good control with a reported hemoglobin A1c of 6.0.

## 2017-06-30 NOTE — Progress Notes (Signed)
   Procedure Note  Patient: Tammy Strickland             Date of Birth: June 03, 1960           MRN: [id number]             Visit Date: 06/30/2017  Procedures: Visit Diagnoses: Status post left knee replacement - Plan: XR Knee 1-2 Views Left  Trochanteric bursitis, left hip  Large Joint Inj: L greater trochanter on 06/30/2017 2:58 PM Indications: pain Details: 22 G 1.5 in needle, lateral approach  Arthrogram: No  Medications: 3 mL lidocaine 1 %; 40 mg methylPREDNISolone acetate 40 MG/ML Outcome: tolerated well, no immediate complications Procedure, treatment alternatives, risks and benefits explained, specific risks discussed. Consent was given by the patient. Immediately prior to procedure a time out was called to verify the correct patient, procedure, equipment, support staff and site/side marked as required. Patient was prepped and draped in the usual sterile fashion.

## 2017-07-04 DIAGNOSIS — J449 Chronic obstructive pulmonary disease, unspecified: Secondary | ICD-10-CM | POA: Diagnosis not present

## 2017-07-07 ENCOUNTER — Other Ambulatory Visit: Payer: Self-pay | Admitting: Emergency Medicine

## 2017-07-07 DIAGNOSIS — I10 Essential (primary) hypertension: Secondary | ICD-10-CM

## 2017-07-07 MED ORDER — POTASSIUM CHLORIDE CRYS ER 20 MEQ PO TBCR
20.0000 meq | EXTENDED_RELEASE_TABLET | Freq: Every day | ORAL | 3 refills | Status: DC
Start: 2017-07-07 — End: 2018-06-24

## 2017-07-07 MED ORDER — FUROSEMIDE 80 MG PO TABS: 80.0000 mg | ORAL_TABLET | Freq: Every day | ORAL | 3 refills | Status: DC

## 2017-07-07 MED ORDER — CLONIDINE HCL 0.3 MG PO TABS: 0.3000 mg | ORAL_TABLET | Freq: Every day | ORAL | 3 refills | Status: DC

## 2017-07-07 MED ORDER — OMEPRAZOLE 20 MG PO CPDR: 20.0000 mg | DELAYED_RELEASE_CAPSULE | Freq: Every day | ORAL | 3 refills | Status: DC | PRN

## 2017-07-07 MED ORDER — METOPROLOL SUCCINATE ER 200 MG PO TB24: 200.0000 mg | ORAL_TABLET | Freq: Two times a day (BID) | ORAL | 3 refills | Status: DC

## 2017-07-07 MED ORDER — LOSARTAN POTASSIUM 50 MG PO TABS: 50.0000 mg | ORAL_TABLET | Freq: Two times a day (BID) | ORAL | 3 refills | Status: DC

## 2017-07-07 NOTE — Addendum Note (Signed)
Addended by: Terence Lux B on: 07/07/2017 11:48 AM   Modules accepted: Orders

## 2017-07-08 ENCOUNTER — Telehealth: Payer: Self-pay | Admitting: Internal Medicine

## 2017-07-08 NOTE — Telephone Encounter (Signed)
Copied from [place] [id number]. Topic: Quick Communication - See Telephone Encounter >> Jul 08, 2017  1:06 PM Ether Griffins B wrote: CRM for notification. See Telephone encounter for: 07/08/17. Pt calling requesting bactrim be called in to [id number] - [place], [place] - [place].

## 2017-07-08 NOTE — Telephone Encounter (Signed)
Per office policy no antibiotic can be refilled w/o OV. Pt will need OV for re-evaluation. MA is out of the office until next week can see someone else...Johny Chess

## 2017-07-09 ENCOUNTER — Other Ambulatory Visit: Payer: Self-pay | Admitting: Internal Medicine

## 2017-07-09 DIAGNOSIS — J42 Unspecified chronic bronchitis: Secondary | ICD-10-CM

## 2017-07-09 MED ORDER — ATORVASTATIN CALCIUM 20 MG PO TABS: ORAL_TABLET | ORAL | 3 refills | Status: DC

## 2017-07-09 NOTE — Addendum Note (Signed)
Addended by: Terence Lux B on: 07/09/2017 01:10 PM   Modules accepted: Orders

## 2017-07-10 DIAGNOSIS — M791 Myalgia, unspecified site: Secondary | ICD-10-CM | POA: Diagnosis not present

## 2017-07-11 NOTE — Progress Notes (Deleted)
Subjective:    Patient ID: Tammy Strickland, female    DOB: 10/03/1960, 57 y.o.   MRN: [id number]  HPI The patient is here for an acute visit.   Boil on arm:    Medications and allergies reviewed with patient and updated if appropriate.  Patient Active Problem List   Diagnosis Date Noted  . Dysphagia 04/29/2017  . History of left knee replacement 04/21/2017  . Pain in left ankle and joints of left foot 04/21/2017  . Pain in left hand 04/21/2017  . Status post total left knee replacement 03/12/2017  . Infected cyst of skin 02/26/2017  . OSA (obstructive sleep apnea) 02/23/2017  . Acute gouty arthritis 12/25/2016  . Stroke ([place])   . Chronic fatigue 11/09/2016  . Hypoxia 11/09/2016  . Morbidly obese ([place]) 11/09/2016  . RLQ abdominal pain 07/21/2016  . Rash 07/21/2016  . Nonintractable headache 07/21/2016  . Allergic rhinitis 07/16/2016  . Vitamin D deficiency 04/16/2016  . Encounter for chronic pain management 03/12/2016  . Hyperlipidemia 01/15/2016  . Gait disorder 12/04/2015  . Diabetes ([place]) 11/26/2015  . Chronic diastolic congestive heart failure ([place]) 11/06/2015  . TIA (transient ischemic attack)   . Carotid-cavernous fistula   . Chest pain 10/31/2015  . Insomnia 10/10/2015  . Depression 10/10/2015  . Benign essential HTN 08/15/2015  . Fibromyalgia 08/15/2015  . Peripheral neuropathy 08/15/2015  . CAD (coronary artery disease) 08/15/2015  . COPD (chronic obstructive pulmonary disease) ([place]) 08/15/2015  . Osteoporosis 08/15/2015  . 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 DDD (degenerative disc disease), lumbar 08/15/2015  . Occupational exposure to industrial toxins 08/15/2015  . GERD (gastroesophageal reflux disease) 08/15/2015    Current Outpatient Medications on File Prior to Visit  Medication Sig Dispense Refill  . AMITIZA 8 MCG capsule Take 1 capsule by mouth 2 (two) times daily.  0  . amitriptyline (ELAVIL) 50 MG tablet Take 1 tablet (50 mg total) by mouth at  bedtime. 90 tablet 1  . aspirin EC 81 MG tablet Take 1 tablet (81 mg total) by mouth daily.    Marland Kitchen atorvastatin (LIPITOR) 20 MG tablet TAKE 1 TABLET(20 MG) BY MOUTH DAILY 90 tablet 3  . cloNIDine (CATAPRES) 0.3 MG tablet Take 1 tablet (0.3 mg total) by mouth daily. 90 tablet 3  . COMBIVENT RESPIMAT 20-100 MCG/ACT AERS respimat Inhale 1 puff by mouth every 6 hours 4 g 11  . Dulaglutide (TRULICITY) 1.5 EK/8.0KL SOPN Inject 1.5 mg into the skin once a week. (Patient taking differently: Inject 1.5 mg every Sunday into the skin. ) 12 pen 1  . escitalopram (LEXAPRO) 10 MG tablet Take 1 tablet (10 mg total) by mouth daily. 90 tablet 3  . fluticasone (FLONASE) 50 MCG/ACT nasal spray SHAKE LIQUID AND INSTILL 1 SPRAY INTO EACH NOSTRIL ONCE DAILY AS NEEDED FOR ALLERGIES 16 g 11  . furosemide (LASIX) 80 MG tablet Take 1 tablet (80 mg total) by mouth daily. 90 tablet 3  . gabapentin (NEURONTIN) 600 MG tablet Take 2 tablets (1,200 mg total) by mouth 3 (three) times daily. 540 tablet 0  . glucose blood test strip Use as instructed to check sugar three times a day 200 each 5  . Lancets MISC Use as directed twice per day 200 each 5  . losartan (COZAAR) 50 MG tablet Take 1 tablet (50 mg total) by mouth 2 (two) times daily. 180 tablet 3  . metFORMIN (GLUCOPHAGE-XR) 500 MG 24 hr tablet TAKE 2 TABLETS BY MOUTH 2  TIMES DAILY WITH A MEAL. (Patient taking differently: Take 500 mg 2 (two) times daily by mouth. ) 360 tablet 1  . methocarbamol (ROBAXIN) 500 MG tablet Take 1 tablet (500 mg total) by mouth every 6 (six) hours as needed for muscle spasms. 60 tablet 0  . metoprolol (TOPROL-XL) 200 MG 24 hr tablet Take 1 tablet (200 mg total) by mouth 2 (two) times daily. 180 tablet 3  . nitroGLYCERIN (NITROSTAT) 0.4 MG SL tablet Place 0.4 mg under the tongue every 5 (five) minutes as needed for chest pain.    Marland Kitchen omeprazole (PRILOSEC) 20 MG capsule Take 1 capsule (20 mg total) by mouth daily as needed (for acid reflux). 90 capsule 3   . oxyCODONE (OXYCONTIN) 15 mg 12 hr tablet Take 15 mg by mouth every 6 (six) hours.    . potassium chloride SA (K-DUR,KLOR-CON) 20 MEQ tablet Take 1 tablet (20 mEq total) by mouth daily. 90 tablet 3  . rivaroxaban (XARELTO) 10 MG TABS tablet Take 1 tablet (10 mg total) by mouth daily with breakfast. 15 tablet 0  . tiZANidine (ZANAFLEX) 4 MG tablet TAKE 2 AND ONE-HALF TABLET BY MOUTH 3 TIMES A DAY (Patient taking differently: TAKE 10 MG BY MOUTH 3 TIMES A DAY) 225 tablet 5   No current facility-administered medications on file prior to visit.     Past Medical History:  Diagnosis Date  . Anxiety   . Arthritis   . CHF (congestive heart failure) ([place])   . COPD (chronic obstructive pulmonary disease) ([place])   . Coronary artery disease   . Depression   . Diabetes mellitus without complication ([place])    type 2   . GERD (gastroesophageal reflux disease)   . Hypertension   . Lupus   . Neuromuscular disorder ([place])   . Osteoporosis   . Oxygen deficiency    pt uses 2.5L 02 at night   . Peripheral neuropathy   . Sleep apnea    had sleep study done recently ; unaware if she will be getting  a CPAP device ; patient states "im pretty sure i have it , i fall alseep all the time "  . Stroke [facility]) 10/2015    Past Surgical History:  Procedure Laterality Date  . ABDOMINAL HYSTERECTOMY     ovaries left  . APPENDECTOMY    . CHOLECYSTECTOMY    . IR GENERIC HISTORICAL  11/07/2015   IR ANGIO INTRA EXTRACRAN SEL COM CAROTID INNOMINATE BILAT MOD SED 11/07/2015 Luanne Bras, MD [facility]  . IR GENERIC HISTORICAL  11/07/2015   IR ANGIO VERTEBRAL SEL SUBCLAVIAN INNOMINATE UNI R MOD SED 11/07/2015 Luanne Bras, MD [facility]  . IR GENERIC HISTORICAL  11/07/2015   IR ANGIO VERTEBRAL SEL VERTEBRAL UNI L MOD SED 11/07/2015 Luanne Bras, MD [facility]  . IR GENERIC HISTORICAL  11/07/2015   IR ANGIOGRAM EXTREMITY LEFT 11/07/2015 Luanne Bras, MD [facility]  . TONSILLECTOMY    .  TOTAL KNEE ARTHROPLASTY Left 03/12/2017   Procedure: LEFT TOTAL KNEE ARTHROPLASTY;  Surgeon: Mcarthur Rossetti, MD;  Location: [facility];  Service: Orthopedics;  Laterality: Left;  Adductor Block    Social History   Socioeconomic History  . Marital status: Single    Spouse name: n/a  . Number of children: 3  . Years of education: 12+  . Highest education level: Not on file  Occupational History  . 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 Financial resource strain: Not on file  . Food insecurity:    Worry: Not on file    Inability: Not on file  . Transportation needs:    Medical: Not on file    Non-medical: Not on file  Tobacco Use  . Smoking status: Current Every Day Smoker    Packs/day: 0.25    Years: 35.00    Pack years: 8.75    Types: Cigarettes  . Smokeless tobacco: Never Used  . Tobacco comment: referred  to smoking  cessation  classes. at  [facility]   Substance and Sexual Activity  . Alcohol use: No    Alcohol/week: 1.2 - 1.8 oz    Types: 2 - 3 Standard drinks or equivalent per week  . 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   Marland Kitchen Sexual activity: Yes    Partners: Female  Lifestyle  . Physical activity:    Days per week: Not on file    Minutes per session: Not on file  . Stress: Not on file  Relationships  . Social connections:    Talks on phone: Not on file    Gets together: Not on file    Attends religious service: Not on file    Active member of club or organization: Not on file    Attends meetings of clubs or organizations: Not on file    Relationship status: Not on file  Other Topics Concern  . Not on file  Social History Narrative   Moved to [place] from [place], [place] February 2017, to help her daughter.   Lives with her daughter.   Sons live in [place] and [place].   She reports that there were originally 17 children in her family (she is the youngest),  the oldest are deceased, some prior to her birth, and she isn't sure which were female/female or how they died.    Family History  Problem Relation Age of Onset  . Hyperlipidemia Mother   . Hypertension Mother   . Hyperlipidemia Father   . Hypertension Father   . Stroke Father   . Hypertension Sister   . Cancer Sister        breast cancer  . Crohn's disease Sister     Review of Systems     Objective:  There were no vitals filed for this visit. BP Readings from Last 3 Encounters:  05/06/17 131/76  04/29/17 104/62  03/22/17 102/82   Wt Readings from Last 3 Encounters:  04/29/17 (!) 305 lb (138.3 kg)  03/12/17 (!) 312 lb (141.5 kg)  03/09/17 (!) 312 lb (141.5 kg)   There is no height or weight on file to calculate BMI.   Physical Exam         Assessment & Plan:    See Problem List for Assessment and Plan of chronic medical problems.

## 2017-07-12 DIAGNOSIS — M25562 Pain in left knee: Secondary | ICD-10-CM | POA: Diagnosis not present

## 2017-07-12 DIAGNOSIS — M545 Low back pain: Secondary | ICD-10-CM | POA: Diagnosis not present

## 2017-07-12 DIAGNOSIS — Z79891 Long term (current) use of opiate analgesic: Secondary | ICD-10-CM | POA: Diagnosis not present

## 2017-07-12 DIAGNOSIS — G894 Chronic pain syndrome: Secondary | ICD-10-CM | POA: Diagnosis not present

## 2017-07-12 DIAGNOSIS — M25559 Pain in unspecified hip: Secondary | ICD-10-CM | POA: Diagnosis not present

## 2017-07-13 ENCOUNTER — Ambulatory Visit: Payer: Medicare Other | Admitting: Internal Medicine

## 2017-07-23 ENCOUNTER — Ambulatory Visit: Payer: Medicare Other | Admitting: Podiatry

## 2017-07-29 ENCOUNTER — Other Ambulatory Visit: Payer: Self-pay | Admitting: Emergency Medicine

## 2017-07-29 MED ORDER — METFORMIN HCL ER 500 MG PO TB24: ORAL_TABLET | ORAL | 1 refills | Status: DC

## 2017-08-04 DIAGNOSIS — J449 Chronic obstructive pulmonary disease, unspecified: Secondary | ICD-10-CM | POA: Diagnosis not present

## 2017-08-05 ENCOUNTER — Encounter: Payer: Self-pay | Admitting: Internal Medicine

## 2017-08-05 ENCOUNTER — Ambulatory Visit (INDEPENDENT_AMBULATORY_CARE_PROVIDER_SITE_OTHER): Payer: Medicare Other | Admitting: Internal Medicine

## 2017-08-05 ENCOUNTER — Ambulatory Visit (INDEPENDENT_AMBULATORY_CARE_PROVIDER_SITE_OTHER)
Admission: RE | Admit: 2017-08-05 | Discharge: 2017-08-05 | Disposition: A | Discharge status: Home / Self Care | Payer: Medicare Other | Source: Ambulatory Visit | Attending: Internal Medicine | Admitting: Internal Medicine

## 2017-08-05 ENCOUNTER — Other Ambulatory Visit: Payer: Self-pay | Admitting: Internal Medicine

## 2017-08-05 VITALS — BP 120/82 | HR 67 | Temp 98.4°F | Ht 67.5 in | Wt 287.0 lb

## 2017-08-05 DIAGNOSIS — L089 Local infection of the skin and subcutaneous tissue, unspecified: Secondary | ICD-10-CM

## 2017-08-05 DIAGNOSIS — J302 Other seasonal allergic rhinitis: Secondary | ICD-10-CM

## 2017-08-05 DIAGNOSIS — R059 Cough, unspecified: Secondary | ICD-10-CM

## 2017-08-05 DIAGNOSIS — L729 Follicular cyst of the skin and subcutaneous tissue, unspecified: Secondary | ICD-10-CM

## 2017-08-05 DIAGNOSIS — R05 Cough: Secondary | ICD-10-CM | POA: Diagnosis not present

## 2017-08-05 MED ORDER — BENZONATATE 200 MG PO CAPS
200.0000 mg | ORAL_CAPSULE | Freq: Three times a day (TID) | ORAL | 0 refills | Status: DC | PRN
Start: 2017-08-05 — End: 2017-09-19

## 2017-08-05 MED ORDER — LEVOCETIRIZINE DIHYDROCHLORIDE 5 MG PO TABS: 5.0000 mg | ORAL_TABLET | Freq: Every evening | ORAL | 0 refills | Status: DC

## 2017-08-05 NOTE — Patient Instructions (Signed)
We have sent in xyzal for the allergies that you take 1 pill daily.   We have sent in cough medicine tessalon perles to use up to 3 times per day for cough.   It is important to keep working on stopping smoking as smoking makes you more likely to have problems with allergies and breathing problems.   We are checking the chest x-ray today and will call you back about the results.

## 2017-08-05 NOTE — Assessment & Plan Note (Signed)
Rx for xyzal in addition to continue flonase. Advised that there is not an allergy injection other than long term allergy shots. Assume she meant steroid injection which is not warranted and can be harmful long term especially with sugars and weight and bone health.

## 2017-08-05 NOTE — Assessment & Plan Note (Signed)
She refused to show the lesion and I informed her that it is not reasonable to treat her for something I cannot examine. She insists that she will just soak in warm water and come back if needed as she does not want to show today.

## 2017-08-05 NOTE — Assessment & Plan Note (Signed)
Checking CXR today due to patient insistence. Rx for tessalon perles for cough. Suspect cough from COPD versus allergies and ongoing smoking rather than lung infection.

## 2017-08-05 NOTE — Progress Notes (Signed)
   Subjective:    Patient ID: Tammy Strickland, female    DOB: 11-Aug-1960, 57 y.o.   MRN: [id number]  HPI The patient is a 57 YO female coming in for several concerns with some demands for her care. She is concerned about cough (going on for several months, 4-5 times per day, sometimes productive of mucus, denies fevers or chills, overall stable since onset, still smoking, denies SOB with exertion, some soreness on chest wall from coughing, denies nausea or vomiting, no sick contact, taking flonase), allergies (itching skin, nose draining, using flonase and saline nose spray, states she used to get an "allergy shot" from her old doctor which helped and wants that, no other information, does have pollen and other allergies, denies ever getting allergy shots weekly, denies rash on skin), and boil (wants bactrim, states she has had this before, somewhere down there, refuses to let anyone see it today, denies fevers or chills, not willing to discuss onset or course otherwise).   Review of Systems  Constitutional: Negative for activity change, appetite change, chills, fatigue, fever and unexpected weight change.  HENT: Positive for congestion, postnasal drip, rhinorrhea and sinus pressure. Negative for ear discharge, ear pain, sinus pain, sneezing, sore throat, tinnitus, trouble swallowing and voice change.   Eyes: Negative.   Respiratory: Positive for cough. Negative for chest tightness, shortness of breath and wheezing.   Cardiovascular: Negative.   Gastrointestinal: Negative.   Endocrine: Negative.   Musculoskeletal: Positive for myalgias.  Skin: Positive for wound.       itching  Neurological: Negative.       Objective:   Physical Exam  Constitutional: She is oriented to person, place, and time. She appears well-developed and well-nourished.  HENT:  Head: Normocephalic and atraumatic.  Oropharynx with redness and clear drainage, TMs normal bilaterally, mild sinus pressure  Eyes: EOM are  normal.  Neck: Normal range of motion.  Cardiovascular: Normal rate and regular rhythm.  Pulmonary/Chest: Effort normal and breath sounds normal. No respiratory distress. She has no wheezes. She has no rales.  Abdominal: Soft. Bowel sounds are normal. She exhibits no distension. There is no tenderness. There is no rebound.  Musculoskeletal: She exhibits tenderness. She exhibits no edema.  Mild tenderness right flank to palpation and bending  Neurological: She is alert and oriented to person, place, and time. Coordination normal.  Skin: Skin is warm and dry.  She refused to let me examine to boil or tell exact location   Vitals:   08/05/17 1057  BP: 120/82  Pulse: 67  Temp: 98.4 F (36.9 C)  TempSrc: Oral  SpO2: 96%  Weight: 287 lb (130.2 kg)  Height: 5' 7.5" (1.715 m)      Assessment & Plan:

## 2017-08-06 ENCOUNTER — Other Ambulatory Visit: Payer: Self-pay | Admitting: *Deleted

## 2017-08-06 MED ORDER — METFORMIN HCL ER 500 MG PO TB24: ORAL_TABLET | ORAL | 1 refills | Status: DC

## 2017-08-09 ENCOUNTER — Ambulatory Visit (INDEPENDENT_AMBULATORY_CARE_PROVIDER_SITE_OTHER): Payer: Medicare Other | Admitting: Physician Assistant

## 2017-08-09 DIAGNOSIS — G894 Chronic pain syndrome: Secondary | ICD-10-CM | POA: Diagnosis not present

## 2017-08-09 DIAGNOSIS — M25559 Pain in unspecified hip: Secondary | ICD-10-CM | POA: Diagnosis not present

## 2017-08-09 DIAGNOSIS — M545 Low back pain: Secondary | ICD-10-CM | POA: Diagnosis not present

## 2017-08-09 DIAGNOSIS — Z79891 Long term (current) use of opiate analgesic: Secondary | ICD-10-CM | POA: Diagnosis not present

## 2017-08-09 DIAGNOSIS — M25562 Pain in left knee: Secondary | ICD-10-CM | POA: Diagnosis not present

## 2017-08-10 DIAGNOSIS — M791 Myalgia, unspecified site: Secondary | ICD-10-CM | POA: Diagnosis not present

## 2017-08-11 ENCOUNTER — Encounter (INDEPENDENT_AMBULATORY_CARE_PROVIDER_SITE_OTHER): Payer: Self-pay | Admitting: Orthopaedic Surgery

## 2017-08-11 ENCOUNTER — Ambulatory Visit (INDEPENDENT_AMBULATORY_CARE_PROVIDER_SITE_OTHER): Payer: Medicare Other | Admitting: Orthopaedic Surgery

## 2017-08-11 DIAGNOSIS — M7062 Trochanteric bursitis, left hip: Secondary | ICD-10-CM | POA: Diagnosis not present

## 2017-08-11 DIAGNOSIS — Z96652 Presence of left artificial knee joint: Secondary | ICD-10-CM

## 2017-08-11 NOTE — Progress Notes (Signed)
Office Visit Note   Patient: Tammy Strickland           Date of Birth: 01-05-1961           MRN: [id number] Visit Date: 08/11/2017              Requested by: Binnie Rail, MD [place], [place] [id number] PCP: Binnie Rail, MD   Assessment & Plan: Visit Diagnoses:  1. Trochanteric bursitis, left hip   2. Status post left knee replacement     Plan: We will send her to physical therapy to work on IT band stretching, modalities, and home exercise program.  We will see her back in 6 weeks check her progress lack of.  She is unable take NSAIDs due to history of TIA and coronary artery disease.  Follow-Up Instructions: Return in about 6 weeks (around 09/22/2017).   Orders:  No orders of the defined types were placed in this encounter.  No orders of the defined types were placed in this encounter.     Procedures: No procedures performed   Clinical Data: No additional findings.   Subjective: Chief Complaint  Patient presents with  . Left Hip - Pain  . Left Knee - Pain    HPI Tammy Strickland returns today follow-up on her left thigh pain.  She had pain lateral aspect of the thigh states the injection really did not help.  She is having pain whenever she lies on the hip.  She has tenderness down the left thigh touch.  She has no hip or groin pain.  Denies any back pain no radicular symptoms down the leg. Review of Systems See HPI otherwise negative  Objective: Vital Signs: There were no vitals taken for this visit.  Physical Exam  Constitutional: She is oriented to person, place, and time. She appears well-developed and well-nourished. No distress.  Pulmonary/Chest: Effort normal.  Neurological: She is alert and oriented to person, place, and time.  Skin: She is not diaphoretic.  Psychiatric: She has a normal mood and affect.    Ortho Exam Left hip good range of motion without pain.  She has negative straight leg raise.  She is able to touch her toes easily  without pain or discomfort.  Nontender over the lower lumbar spine and lower paraspinous region particularly on the left.  She has tenderness left trochanteric region and down the entire her IT band.  There is no rashes skin lesions ulcerations over the lateral thigh.  Left knee full range of motion without pain.  Surgical incisions well-healed. Specialty Comments:  No specialty comments available.  Imaging: No results found.   PMFS History: Patient Active Problem List   Diagnosis Date Noted  . Cough 08/05/2017  . Dysphagia 04/29/2017  . History of left knee replacement 04/21/2017  . Pain in left ankle and joints of left foot 04/21/2017  . Pain in left hand 04/21/2017  . Status post total left knee replacement 03/12/2017  . Infected cyst of skin 02/26/2017  . OSA (obstructive sleep apnea) 02/23/2017  . Acute gouty arthritis 12/25/2016  . Stroke ([place])   . Chronic fatigue 11/09/2016  . Hypoxia 11/09/2016  . Morbidly obese ([place]) 11/09/2016  . RLQ abdominal pain 07/21/2016  . Rash 07/21/2016  . Nonintractable headache 07/21/2016  . Allergic rhinitis 07/16/2016  . Vitamin D deficiency 04/16/2016  . Encounter for chronic pain management 03/12/2016  . Hyperlipidemia 01/15/2016  . Gait disorder 12/04/2015  . Diabetes ([place]) 11/26/2015  .  Chronic diastolic congestive heart failure ([place]) 11/06/2015  . TIA (transient ischemic attack)   . Carotid-cavernous fistula   . Chest pain 10/31/2015  . Insomnia 10/10/2015  . Depression 10/10/2015  . Benign essential HTN 08/15/2015  . Fibromyalgia 08/15/2015  . Peripheral neuropathy 08/15/2015  . CAD (coronary artery disease) 08/15/2015  . COPD (chronic obstructive pulmonary disease) ([place]) 08/15/2015  . Osteoporosis 08/15/2015  . 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 DDD (degenerative disc disease), lumbar 08/15/2015  . Occupational exposure to industrial toxins 08/15/2015  . GERD (gastroesophageal reflux disease) 08/15/2015   Past Medical History:    Diagnosis Date  . Anxiety   . Arthritis   . CHF (congestive heart failure) ([place])   . COPD (chronic obstructive pulmonary disease) ([place])   . Coronary artery disease   . Depression   . Diabetes mellitus without complication ([place])    type 2   . GERD (gastroesophageal reflux disease)   . Hypertension   . Lupus ([place])   . Neuromuscular disorder ([place])   . Osteoporosis   . Oxygen deficiency    pt uses 2.5L 02 at night   . Peripheral neuropathy   . Sleep apnea    had sleep study done recently ; unaware if she will be getting  a CPAP device ; patient states "im pretty sure i have it , i fall alseep all the time "  . Stroke [facility]) 10/2015    Family History  Problem Relation Age of Onset  . Hyperlipidemia Mother   . Hypertension Mother   . Hyperlipidemia Father   . Hypertension Father   . Stroke Father   . Hypertension Sister   . Cancer Sister        breast cancer  . Crohn's disease Sister     Past Surgical History:  Procedure Laterality Date  . ABDOMINAL HYSTERECTOMY     ovaries left  . APPENDECTOMY    . CHOLECYSTECTOMY    . IR GENERIC HISTORICAL  11/07/2015   IR ANGIO INTRA EXTRACRAN SEL COM CAROTID INNOMINATE BILAT MOD SED 11/07/2015 Luanne Bras, MD [facility]  . IR GENERIC HISTORICAL  11/07/2015   IR ANGIO VERTEBRAL SEL SUBCLAVIAN INNOMINATE UNI R MOD SED 11/07/2015 Luanne Bras, MD [facility]  . IR GENERIC HISTORICAL  11/07/2015   IR ANGIO VERTEBRAL SEL VERTEBRAL UNI L MOD SED 11/07/2015 Luanne Bras, MD [facility]  . IR GENERIC HISTORICAL  11/07/2015   IR ANGIOGRAM EXTREMITY LEFT 11/07/2015 Luanne Bras, MD [facility]  . TONSILLECTOMY    . TOTAL KNEE ARTHROPLASTY Left 03/12/2017   Procedure: LEFT TOTAL KNEE ARTHROPLASTY;  Surgeon: Mcarthur Rossetti, MD;  Location: [facility];  Service: Orthopedics;  Laterality: Left;  Adductor Block   Social History   Occupational History  . 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 Smoking status: Current Every Day Smoker    Packs/day: 0.25    Years: 35.00    Pack years: 8.75    Types: Cigarettes  . Smokeless tobacco: Never Used  . Tobacco comment: referred  to smoking  cessation  classes. at  [facility]   Substance and Sexual Activity  . Alcohol use: No    Alcohol/week: 1.2 - 1.8 oz    Types: 2 - 3 Standard drinks or equivalent per week  . 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   Marland Kitchen Sexual activity: Yes    Partners: Female

## 2017-08-13 ENCOUNTER — Encounter: Payer: Self-pay | Admitting: Podiatry

## 2017-08-13 ENCOUNTER — Ambulatory Visit (INDEPENDENT_AMBULATORY_CARE_PROVIDER_SITE_OTHER): Payer: Medicare Other | Admitting: Podiatry

## 2017-08-13 DIAGNOSIS — M79674 Pain in right toe(s): Secondary | ICD-10-CM | POA: Diagnosis not present

## 2017-08-13 DIAGNOSIS — B351 Tinea unguium: Secondary | ICD-10-CM

## 2017-08-13 DIAGNOSIS — M79675 Pain in left toe(s): Secondary | ICD-10-CM | POA: Diagnosis not present

## 2017-08-13 DIAGNOSIS — E1149 Type 2 diabetes mellitus with other diabetic neurological complication: Secondary | ICD-10-CM

## 2017-08-15 NOTE — Progress Notes (Signed)
Subjective: 57 y.o. returns the office today for painful, elongated, thickened toenails which she cannot trim herself and she denies any redness or drainage around the nails.  She states that he was doing very well denies any acute changes since last appointment and no new complaints today. Denies any systemic complaints such as fevers, chills, nausea, vomiting.   Objective: AAO 3, NAD DP/PT pulses palpable, CRT less than 3 seconds Protective sensation decreased with Simms Weinstein monofilament Nails hypertrophic, dystrophic, elongated, brittle, discolored 10. There is tenderness overlying the nails 1-5 bilaterally. There is incurvation of both corners of bilateral hallux toenails.  She was being try to trim them on as much as possible.  There is no surrounding erythema or drainage along the nail sites. No open lesions or other pre-ulcerative lesions are identified. Dry skin is present No other areas of tenderness bilateral lower extremities. No overlying edema, erythema, increased warmth. No pain with calf compression, swelling, warmth, erythema.  Assessment: Patient presents with symptomatic onychomycosis; hyperkeratotic lesions bilateral heels  Plan: -Treatment options including alternatives, risks, complications were discussed -Nails sharply debrided 10 without complication.  Minimal amount of bleeding occurred during removal of part of the nail on the medial corner on the right side of the nail is significantly incurvated.  The area was cleaned with alcohol and antibiotic ointment was applied followed by a bandage.  Monitoring signs or symptoms of infection there is any issues to let me know.  Ultimately we may need to remove the nail.  We have been holding off on nail removal given that she is been getting over her knee surgery and she wants to continue with therapy for this she does not want the toenails to hinder this. -RTC 3 months or sooner if needed.  Trula Slade DPM

## 2017-08-16 ENCOUNTER — Ambulatory Visit (INDEPENDENT_AMBULATORY_CARE_PROVIDER_SITE_OTHER): Payer: Medicare Other | Admitting: Orthopaedic Surgery

## 2017-08-25 ENCOUNTER — Other Ambulatory Visit: Payer: Self-pay | Admitting: Internal Medicine

## 2017-08-25 NOTE — Telephone Encounter (Signed)
Medication is not on Med list.

## 2017-08-30 ENCOUNTER — Other Ambulatory Visit: Payer: Self-pay | Admitting: Emergency Medicine

## 2017-08-30 DIAGNOSIS — M5136 Other intervertebral disc degeneration, lumbar region: Secondary | ICD-10-CM

## 2017-08-30 MED ORDER — TIZANIDINE HCL 4 MG PO TABS: 10.0000 mg | ORAL_TABLET | Freq: Three times a day (TID) | ORAL | 5 refills | Status: DC

## 2017-09-03 ENCOUNTER — Telehealth: Payer: Self-pay | Admitting: Internal Medicine

## 2017-09-03 DIAGNOSIS — J449 Chronic obstructive pulmonary disease, unspecified: Secondary | ICD-10-CM | POA: Diagnosis not present

## 2017-09-03 NOTE — Telephone Encounter (Signed)
Unable to reach a representative after 2 attempts. RX is not on pts medication list, therefore it will not be filled.

## 2017-09-03 NOTE — Telephone Encounter (Signed)
Copied from [place] [id number]. Topic: Quick Communication - See Telephone Encounter >> Sep 03, 2017 10:36 AM Cleaster Corin, NT wrote: CRM for notification. See Telephone encounter for: 09/03/17.  Estill Bamberg calling from [place] asking about med. traZODone (DESYREL) 50 MG tablet [[id number]] asking why med. Refill was denied Estill Bamberg can be reached at [place], [place] [address] [place] [address] Phone: [phone] Fax: [phone]

## 2017-09-07 ENCOUNTER — Other Ambulatory Visit: Payer: Self-pay | Admitting: Internal Medicine

## 2017-09-08 DIAGNOSIS — M25559 Pain in unspecified hip: Secondary | ICD-10-CM | POA: Diagnosis not present

## 2017-09-08 DIAGNOSIS — Z79891 Long term (current) use of opiate analgesic: Secondary | ICD-10-CM | POA: Diagnosis not present

## 2017-09-08 DIAGNOSIS — M545 Low back pain: Secondary | ICD-10-CM | POA: Diagnosis not present

## 2017-09-08 DIAGNOSIS — G894 Chronic pain syndrome: Secondary | ICD-10-CM | POA: Diagnosis not present

## 2017-09-08 DIAGNOSIS — M25562 Pain in left knee: Secondary | ICD-10-CM | POA: Diagnosis not present

## 2017-09-09 DIAGNOSIS — M791 Myalgia, unspecified site: Secondary | ICD-10-CM | POA: Diagnosis not present

## 2017-09-16 ENCOUNTER — Encounter (HOSPITAL_COMMUNITY): Payer: Self-pay | Admitting: Emergency Medicine

## 2017-09-16 ENCOUNTER — Emergency Department (HOSPITAL_COMMUNITY)
Admission: EM | Admit: 2017-09-16 | Discharge: 2017-09-16 | Discharge status: Against Medical Advice | Payer: Medicare Other | Attending: Emergency Medicine | Admitting: Emergency Medicine

## 2017-09-16 DIAGNOSIS — Z5321 Procedure and treatment not carried out due to patient leaving prior to being seen by health care provider: Secondary | ICD-10-CM | POA: Insufficient documentation

## 2017-09-16 DIAGNOSIS — R600 Localized edema: Secondary | ICD-10-CM | POA: Diagnosis present

## 2017-09-16 NOTE — ED Notes (Signed)
Bed: WTR6 Expected date:  Expected time:  Means of arrival:  Comments: 

## 2017-09-16 NOTE — ED Notes (Signed)
No answer from lobby  

## 2017-09-16 NOTE — ED Triage Notes (Signed)
Pt c/o right eye swelling that started this morning when she woke up. Denies any drainage. Reports pain and blurred vision.

## 2017-09-16 NOTE — ED Notes (Signed)
Called Pt in lobby to be roomed, no response in lobby x1.

## 2017-09-17 ENCOUNTER — Ambulatory Visit: Payer: Self-pay | Admitting: Family

## 2017-09-17 NOTE — ED Notes (Signed)
Follow up call made  No answer  09/17/17  0951 s Adham Johnson rn

## 2017-09-19 NOTE — Progress Notes (Signed)
Subjective:    Patient ID: Tammy Strickland, female    DOB: [birth year], 57 y.o.   MRN: [id number]  HPI The patient is here for an acute visit.  Right lower eye lid stye:  It developed last week.  It was large and she lanced it herself 2 days ago. She got pus out.  She still has some swelling there and her eye is crusted shut in the morning.  It tears during the day.  She denies eye pain or changes in vision.  She is doing warm compresses.   Dry cough:  She has a dry cough during the day. Early in the monring she coughs some phlegm.  She sometimes gets SOB and it is more than usual.  She has some wheezing.  She takes xyzal daily.  She takes omeprazole 20 mg daily and she thinks that controls the GERD.  She uses albuterol prn and it does not help this.  It only helps with colds.  She is still smoking but trying to quit.  Diabetes: She is taking her medication daily as prescribed. She is compliant with a diabetic diet. She is not exercising regularly.   Hypertension: She is taking her medication daily. She is compliant with a low sodium diet.  She denies chest pain, palpitations, edema and regular headaches. She is not exercising regularly.    Medications and allergies reviewed with patient and updated if appropriate.  Patient Active Problem List   Diagnosis Date Noted  . Cough 08/05/2017  . Dysphagia 04/29/2017  . History of left knee replacement 04/21/2017  . Pain in left ankle and joints of left foot 04/21/2017  . Pain in left hand 04/21/2017  . Status post total left knee replacement 03/12/2017  . Infected cyst of skin 02/26/2017  . OSA (obstructive sleep apnea) 02/23/2017  . Acute gouty arthritis 12/25/2016  . Stroke ([place])   . Chronic fatigue 11/09/2016  . Hypoxia 11/09/2016  . Morbidly obese ([place]) 11/09/2016  . RLQ abdominal pain 07/21/2016  . Rash 07/21/2016  . Nonintractable headache 07/21/2016  . Allergic rhinitis 07/16/2016  . Vitamin D deficiency 04/16/2016  .  Encounter for chronic pain management 03/12/2016  . Hyperlipidemia 01/15/2016  . Gait disorder 12/04/2015  . Diabetes ([place]) 11/26/2015  . Chronic diastolic congestive heart failure ([place]) 11/06/2015  . TIA (transient ischemic attack)   . Carotid-cavernous fistula   . Chest pain 10/31/2015  . Insomnia 10/10/2015  . Depression 10/10/2015  . Benign essential HTN 08/15/2015  . Fibromyalgia 08/15/2015  . Peripheral neuropathy 08/15/2015  . CAD (coronary artery disease) 08/15/2015  . COPD (chronic obstructive pulmonary disease) ([place]) 08/15/2015  . Osteoporosis 08/15/2015  . 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 DDD (degenerative disc disease), lumbar 08/15/2015  . Occupational exposure to industrial toxins 08/15/2015  . GERD (gastroesophageal reflux disease) 08/15/2015    Current Outpatient Medications on File Prior to Visit  Medication Sig Dispense Refill  . AMITIZA 8 MCG capsule Take 1 capsule by mouth 2 (two) times daily.  0  . amitriptyline (ELAVIL) 50 MG tablet Take 1 tablet (50 mg total) by mouth at bedtime. 90 tablet 1  . aspirin EC 81 MG tablet Take 1 tablet (81 mg total) by mouth daily.    Marland Kitchen atorvastatin (LIPITOR) 20 MG tablet TAKE 1 TABLET(20 MG) BY MOUTH DAILY 90 tablet 3  . cloNIDine (CATAPRES) 0.3 MG tablet Take 1 tablet (0.3 mg total) by mouth daily. 90 tablet 3  . COMBIVENT RESPIMAT 20-100 MCG/ACT AERS  respimat Inhale 1 puff by mouth every 6 hours 4 g 11  . Dulaglutide (TRULICITY) 1.5 PJ/0.9TO SOPN Inject 1.5 mg into the skin once a week. (Patient taking differently: Inject 1.5 mg every Sunday into the skin. ) 12 pen 1  . fluticasone (FLONASE) 50 MCG/ACT nasal spray SHAKE LIQUID AND INSTILL 1 SPRAY INTO EACH NOSTRIL ONCE DAILY AS NEEDED FOR ALLERGIES 16 g 11  . furosemide (LASIX) 80 MG tablet Take 1 tablet (80 mg total) by mouth daily. 90 tablet 3  . gabapentin (NEURONTIN) 600 MG tablet Take 2 tablets (1,200 mg total) by mouth 3 (three) times daily. 540 tablet 0  . glucose blood test  strip Use as instructed to check sugar three times a day 200 each 5  . Lancets MISC Use as directed twice per day 200 each 5  . levocetirizine (XYZAL) 5 MG tablet TAKE 1 TABLET(5 MG) BY MOUTH EVERY EVENING 90 tablet 0  . losartan (COZAAR) 50 MG tablet Take 1 tablet (50 mg total) by mouth 2 (two) times daily. 180 tablet 3  . metFORMIN (GLUCOPHAGE-XR) 500 MG 24 hr tablet TAKE 2 TABLETS BY MOUTH 2  TIMES DAILY WITH A MEAL. 360 tablet 1  . methocarbamol (ROBAXIN) 500 MG tablet Take 1 tablet (500 mg total) by mouth every 6 (six) hours as needed for muscle spasms. 60 tablet 0  . metoprolol (TOPROL-XL) 200 MG 24 hr tablet Take 1 tablet (200 mg total) by mouth 2 (two) times daily. 180 tablet 3  . nitroGLYCERIN (NITROSTAT) 0.4 MG SL tablet Place 0.4 mg under the tongue every 5 (five) minutes as needed for chest pain.    Marland Kitchen omeprazole (PRILOSEC) 20 MG capsule Take 1 capsule (20 mg total) by mouth daily as needed (for acid reflux). 90 capsule 3  . oxyCODONE (OXYCONTIN) 15 mg 12 hr tablet Take 15 mg by mouth every 6 (six) hours.    . potassium chloride SA (K-DUR,KLOR-CON) 20 MEQ tablet Take 1 tablet (20 mEq total) by mouth daily. 90 tablet 3  . rivaroxaban (XARELTO) 10 MG TABS tablet Take 1 tablet (10 mg total) by mouth daily with breakfast. 15 tablet 0  . tiZANidine (ZANAFLEX) 4 MG tablet Take 2.5 tablets (10 mg total) by mouth 3 (three) times daily. 225 tablet 5  . escitalopram (LEXAPRO) 10 MG tablet Take 1 tablet (10 mg total) by mouth daily. 90 tablet 3   No current facility-administered medications on file prior to visit.     Past Medical History:  Diagnosis Date  . Anxiety   . Arthritis   . CHF (congestive heart failure) ([place])   . COPD (chronic obstructive pulmonary disease) ([place])   . Coronary artery disease   . Depression   . Diabetes mellitus without complication ([place])    type 2   . GERD (gastroesophageal reflux disease)   . Hypertension   . Lupus ([place])   . Neuromuscular disorder ([place])   .  Osteoporosis   . Oxygen deficiency    pt uses 2.5L 02 at night   . Peripheral neuropathy   . Sleep apnea    had sleep study done recently ; unaware if she will be getting  a CPAP device ; patient states "im pretty sure i have it , i fall alseep all the time "  . Stroke [facility]) 10/2015    Past Surgical History:  Procedure Laterality Date  . ABDOMINAL HYSTERECTOMY     ovaries left  . APPENDECTOMY    . CHOLECYSTECTOMY    .  IR GENERIC HISTORICAL  11/07/2015   IR ANGIO INTRA EXTRACRAN SEL COM CAROTID INNOMINATE BILAT MOD SED 11/07/2015 Luanne Bras, MD [facility]  . IR GENERIC HISTORICAL  11/07/2015   IR ANGIO VERTEBRAL SEL SUBCLAVIAN INNOMINATE UNI R MOD SED 11/07/2015 Luanne Bras, MD [facility]  . IR GENERIC HISTORICAL  11/07/2015   IR ANGIO VERTEBRAL SEL VERTEBRAL UNI L MOD SED 11/07/2015 Luanne Bras, MD [facility]  . IR GENERIC HISTORICAL  11/07/2015   IR ANGIOGRAM EXTREMITY LEFT 11/07/2015 Luanne Bras, MD [facility]  . TONSILLECTOMY    . TOTAL KNEE ARTHROPLASTY Left 03/12/2017   Procedure: LEFT TOTAL KNEE ARTHROPLASTY;  Surgeon: Mcarthur Rossetti, MD;  Location: [facility];  Service: Orthopedics;  Laterality: Left;  Adductor Block    Social History   Socioeconomic History  . Marital status: Single    Spouse name: n/a  . Number of children: 3  . Years of education: 12+  . Highest education level: Not on file  Occupational History  . 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 Financial resource strain: Not on file  . Food insecurity:    Worry: Not on file    Inability: Not on file  . Transportation needs:    Medical: Not on file    Non-medical: Not on file  Tobacco Use  . Smoking status: Current Every Day Smoker    Packs/day: 0.25    Years: 35.00    Pack years: 8.75    Types: Cigarettes  . Smokeless tobacco: Never Used  . Tobacco comment: referred   to smoking  cessation  classes. at  [facility]   Substance and Sexual Activity  . Alcohol use: No    Alcohol/week: 1.2 - 1.8 oz    Types: 2 - 3 Standard drinks or equivalent per week  . 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   Marland Kitchen Sexual activity: Yes    Partners: Female  Lifestyle  . Physical activity:    Days per week: Not on file    Minutes per session: Not on file  . Stress: Not on file  Relationships  . Social connections:    Talks on phone: Not on file    Gets together: Not on file    Attends religious service: Not on file    Active member of club or organization: Not on file    Attends meetings of clubs or organizations: Not on file    Relationship status: Not on file  Other Topics Concern  . Not on file  Social History Narrative   Moved to [place] from [place], [place] February 2017, to help her daughter.   Lives with her daughter.   Sons live in [place] and [place].   She reports that there were originally 17 children in her family (she is the youngest), the oldest are deceased, some prior to her birth, and she isn't sure which were female/female or how they died.    Family History  Problem Relation Age of Onset  . Hyperlipidemia Mother   . Hypertension Mother   . Hyperlipidemia Father   . Hypertension Father   . Stroke Father   . Hypertension Sister   . Cancer Sister        breast cancer  . Crohn's disease Sister     Review of Systems  Constitutional: Negative for chills and fever.  HENT: Negative for postnasal drip.   Respiratory:  Positive for cough, shortness of breath (chornic, more than usual) and wheezing.   Cardiovascular: Negative for chest pain, palpitations and leg swelling.  Gastrointestinal:       Jerrye Bushy daily  Neurological: Negative for light-headedness and headaches.       Objective:   Vitals:   09/21/17 0750  BP: 110/76  Pulse: 71  Resp: 18  Temp: 98.6 F (37 C)  SpO2: 96%   BP Readings from Last 3 Encounters:  09/21/17 110/76    08/05/17 120/82  05/06/17 131/76   Wt Readings from Last 3 Encounters:  09/21/17 294 lb (133.4 kg)  08/05/17 287 lb (130.2 kg)  04/29/17 (!) 305 lb (138.3 kg)   Body mass index is 45.37 kg/m.   Physical Exam    Constitutional: Appears well-developed and well-nourished. No distress.  HENT:  Head: Normocephalic and atraumatic.  Eyes: lower right eye lid swelling with small bump - no active discharge Neck: Neck supple. No tracheal deviation present. No thyromegaly present.  No cervical lymphadenopathy Cardiovascular: Normal rate, regular rhythm and normal heart sounds.   No murmur heard. No carotid bruit .  No edema Pulmonary/Chest: Effort normal and breath sounds normal. No respiratory distress. Mild upper airway wheezes. No rales.  Skin: Skin is warm and dry. Not diaphoretic.  Psychiatric: Normal mood and affect. Behavior is normal.      Assessment & Plan:    See Problem List for Assessment and Plan of chronic medical problems.

## 2017-09-20 ENCOUNTER — Telehealth (INDEPENDENT_AMBULATORY_CARE_PROVIDER_SITE_OTHER): Payer: Self-pay | Admitting: Radiology

## 2017-09-20 ENCOUNTER — Other Ambulatory Visit (INDEPENDENT_AMBULATORY_CARE_PROVIDER_SITE_OTHER): Payer: Self-pay

## 2017-09-20 DIAGNOSIS — M7062 Trochanteric bursitis, left hip: Secondary | ICD-10-CM

## 2017-09-20 NOTE — Telephone Encounter (Signed)
Patient called and said that the PT facility we referred her to does not accept her insurance.  New referral entered in epic for her to a [facility] facility.  She is still having pain, I advised her to see how the PT helps. She understands.

## 2017-09-21 ENCOUNTER — Encounter: Payer: Self-pay | Admitting: Internal Medicine

## 2017-09-21 ENCOUNTER — Ambulatory Visit (INDEPENDENT_AMBULATORY_CARE_PROVIDER_SITE_OTHER): Payer: Medicare Other | Admitting: Internal Medicine

## 2017-09-21 ENCOUNTER — Other Ambulatory Visit (INDEPENDENT_AMBULATORY_CARE_PROVIDER_SITE_OTHER): Payer: Medicare Other

## 2017-09-21 VITALS — BP 110/76 | HR 71 | Temp 98.6°F | Resp 18 | Wt 294.0 lb

## 2017-09-21 DIAGNOSIS — I10 Essential (primary) hypertension: Secondary | ICD-10-CM

## 2017-09-21 DIAGNOSIS — E7849 Other hyperlipidemia: Secondary | ICD-10-CM | POA: Diagnosis not present

## 2017-09-21 DIAGNOSIS — H00012 Hordeolum externum right lower eyelid: Secondary | ICD-10-CM

## 2017-09-21 DIAGNOSIS — R059 Cough, unspecified: Secondary | ICD-10-CM

## 2017-09-21 DIAGNOSIS — E119 Type 2 diabetes mellitus without complications: Secondary | ICD-10-CM

## 2017-09-21 DIAGNOSIS — R05 Cough: Secondary | ICD-10-CM | POA: Diagnosis not present

## 2017-09-21 DIAGNOSIS — H00019 Hordeolum externum unspecified eye, unspecified eyelid: Secondary | ICD-10-CM | POA: Insufficient documentation

## 2017-09-21 LAB — LIPID PANEL
Cholesterol: 125 mg/dL (ref 0–200)
HDL: 47.4 mg/dL (ref >39.00)
LDL Cholesterol: 52 mg/dL (ref 0–99)
NonHDL: 77.4
Total CHOL/HDL Ratio: 3
Triglycerides: 125 mg/dL (ref 0.0–149.0)
VLDL: 25 mg/dL (ref 0.0–40.0)

## 2017-09-21 LAB — COMPREHENSIVE METABOLIC PANEL
ALT: 13 U/L (ref 0–35)
AST: 19 U/L (ref 0–37)
Albumin: 4 g/dL (ref 3.5–5.2)
Alkaline Phosphatase: 98 U/L (ref 39–117)
BUN: 8 mg/dL (ref 6–23)
CO2: 28 mEq/L (ref 19–32)
Calcium: 8.9 mg/dL (ref 8.4–10.5)
Chloride: 102 mEq/L (ref 96–112)
Creatinine, Ser: 0.73 mg/dL (ref 0.40–1.20)
GFR: 106.26 mL/min (ref >60.00)
Glucose, Bld: 176 mg/dL — ABNORMAL HIGH (ref 70–99)
Potassium: 3.6 mEq/L (ref 3.5–5.1)
Sodium: 142 mEq/L (ref 135–145)
Total Bilirubin: 0.3 mg/dL (ref 0.2–1.2)
Total Protein: 7.3 g/dL (ref 6.0–8.3)

## 2017-09-21 LAB — HEMOGLOBIN A1C: Hgb A1c MFr Bld: 6.5 % (ref 4.6–6.5)

## 2017-09-21 MED ORDER — OMEPRAZOLE 40 MG PO CPDR: 40.0000 mg | DELAYED_RELEASE_CAPSULE | Freq: Every day | ORAL | 3 refills | Status: DC

## 2017-09-21 MED ORDER — ERYTHROMYCIN 5 MG/GM OP OINT: 1.0000 "application " | TOPICAL_OINTMENT | Freq: Every day | OPHTHALMIC | 0 refills | Status: DC

## 2017-09-21 MED ORDER — BUDESONIDE-FORMOTEROL FUMARATE 80-4.5 MCG/ACT IN AERO: 2.0000 | INHALATION_SPRAY | Freq: Two times a day (BID) | RESPIRATORY_TRACT | 3 refills | Status: DC

## 2017-09-21 NOTE — Assessment & Plan Note (Signed)
BP well controlled Current regimen effective and well tolerated Continue current medications at current doses cmp  

## 2017-09-21 NOTE — Patient Instructions (Addendum)
[facility]  [address]  [place], [place] [address]  Main: [phone]    Test(s) ordered today. Your results will be released to [facility] (or called to you) after review, usually within 72hours after test completion. If any changes need to be made, you will be notified at that same time.   Medications reviewed and updated.  Changes include an ointment for your eye lid, increasing the omeprazole to 40 mg and start a twice daily maintenance inhaler.    Your prescription(s) have been submitted to your pharmacy. Please take as directed and contact our office if you believe you are having problem(s) with the medication(s).    Please followup in 6 months

## 2017-09-21 NOTE — Assessment & Plan Note (Signed)
a1c Sugars have been controlled Continue current medications encouraged weight loss

## 2017-09-21 NOTE — Assessment & Plan Note (Signed)
Right lower eye lid She lanced it herself but there is still a small residual stye Erythromycin ointment at night Continue warm compresses See eye doctor if this does not resolve

## 2017-09-21 NOTE — Assessment & Plan Note (Signed)
Check lipid panel, cmp  Continue daily statin

## 2017-09-21 NOTE — Assessment & Plan Note (Signed)
Possibly COPD/bronchitis, allergy or GERD Mild wheezes in upper airway Increase omeprazole to 40 mg daily Continue xyzal daily Start symbicort twice daily - advised to rinse mouth after use Consider referral to pulm if needed

## 2017-09-27 ENCOUNTER — Other Ambulatory Visit: Payer: Self-pay | Admitting: Internal Medicine

## 2017-09-28 ENCOUNTER — Encounter (HOSPITAL_COMMUNITY): Payer: Self-pay | Admitting: *Deleted

## 2017-09-28 ENCOUNTER — Emergency Department (HOSPITAL_COMMUNITY)
Admission: EM | Admit: 2017-09-28 | Discharge: 2017-09-28 | Disposition: A | Discharge status: Home / Self Care | Payer: Medicare Other | Attending: Emergency Medicine | Admitting: Emergency Medicine

## 2017-09-28 DIAGNOSIS — B354 Tinea corporis: Secondary | ICD-10-CM | POA: Insufficient documentation

## 2017-09-28 DIAGNOSIS — I110 Hypertensive heart disease with heart failure: Secondary | ICD-10-CM | POA: Diagnosis not present

## 2017-09-28 DIAGNOSIS — I509 Heart failure, unspecified: Secondary | ICD-10-CM | POA: Diagnosis not present

## 2017-09-28 DIAGNOSIS — Z79899 Other long term (current) drug therapy: Secondary | ICD-10-CM | POA: Diagnosis not present

## 2017-09-28 DIAGNOSIS — J449 Chronic obstructive pulmonary disease, unspecified: Secondary | ICD-10-CM | POA: Diagnosis not present

## 2017-09-28 DIAGNOSIS — Z7982 Long term (current) use of aspirin: Secondary | ICD-10-CM | POA: Diagnosis not present

## 2017-09-28 DIAGNOSIS — F17210 Nicotine dependence, cigarettes, uncomplicated: Secondary | ICD-10-CM | POA: Insufficient documentation

## 2017-09-28 DIAGNOSIS — K649 Unspecified hemorrhoids: Secondary | ICD-10-CM | POA: Diagnosis not present

## 2017-09-28 DIAGNOSIS — L299 Pruritus, unspecified: Secondary | ICD-10-CM | POA: Diagnosis not present

## 2017-09-28 DIAGNOSIS — R21 Rash and other nonspecific skin eruption: Secondary | ICD-10-CM | POA: Diagnosis not present

## 2017-09-28 DIAGNOSIS — K644 Residual hemorrhoidal skin tags: Secondary | ICD-10-CM | POA: Diagnosis not present

## 2017-09-28 MED ORDER — HYDROCORTISONE 2.5 % RE CREA: TOPICAL_CREAM | RECTAL | 0 refills | Status: DC

## 2017-09-28 MED ORDER — CLOTRIMAZOLE 1 % EX CREA: TOPICAL_CREAM | CUTANEOUS | 0 refills | Status: DC

## 2017-09-28 NOTE — ED Triage Notes (Signed)
Pt in c/o possible abscess at her rectal area, states it has been there for two weeks and she thought it was a hemerrhoid but is now concerned it is an abscess

## 2017-09-28 NOTE — ED Provider Notes (Signed)
[facility] EMERGENCY DEPARTMENT Provider Note   CSN: [id number] Arrival date & time: 09/28/17  1345     History   Chief Complaint Chief Complaint  Patient presents with  . Abscess    HPI Tammy Strickland is a 57 y.o. female with a history of type 2 diabetes presenting for rectal mass that she first noticed 2-1/2 weeks ago.  Patient states that she is concerned that it is an or hemorrhoids in the past..  She states that she has been using sitz baths and stool softeners with no relief.  Patient states pain is 6/10 when she is wiping or straining to defecate.  Patient denies ever having hemorrhoids or rectal abscesses in the past.  Patient denies history of fevers, nausea vomiting, hematochezia, changes in stool, hematuria, dysuria, abdominal pain or trauma to the area.     Past Medical History:  Diagnosis Date  . Anxiety   . Arthritis   . CHF (congestive heart failure) ([place])   . COPD (chronic obstructive pulmonary disease) ([place])   . Coronary artery disease   . Depression   . Diabetes mellitus without complication ([place])    type 2   . GERD (gastroesophageal reflux disease)   . Hypertension   . Lupus ([place])   . Neuromuscular disorder ([place])   . Osteoporosis   . Oxygen deficiency    pt uses 2.5L 02 at night   . Peripheral neuropathy   . Sleep apnea    had sleep study done recently ; unaware if she will be getting  a CPAP device ; patient states "im pretty sure i have it , i fall alseep all the time "  . Stroke [facility]) 10/2015    Patient Active Problem List   Diagnosis Date Noted  . Stye external 09/21/2017  . Cough 08/05/2017  . Dysphagia 04/29/2017  . History of left knee replacement 04/21/2017  . Pain in left ankle and joints of left foot 04/21/2017  . Pain in left hand 04/21/2017  . Status post total left knee replacement 03/12/2017  . OSA (obstructive sleep apnea) 02/23/2017  . Acute gouty arthritis 12/25/2016  . Stroke ([place])   . Chronic fatigue  11/09/2016  . Hypoxia 11/09/2016  . Morbidly obese ([place]) 11/09/2016  . Allergic rhinitis 07/16/2016  . Vitamin D deficiency 04/16/2016  . Encounter for chronic pain management 03/12/2016  . Hyperlipidemia 01/15/2016  . Gait disorder 12/04/2015  . Diabetes ([place]) 11/26/2015  . Chronic diastolic congestive heart failure ([place]) 11/06/2015  . TIA (transient ischemic attack)   . Carotid-cavernous fistula   . Chest pain 10/31/2015  . Insomnia 10/10/2015  . Depression 10/10/2015  . Benign essential HTN 08/15/2015  . Fibromyalgia 08/15/2015  . Peripheral neuropathy 08/15/2015  . CAD (coronary artery disease) 08/15/2015  . COPD (chronic obstructive pulmonary disease) ([place]) 08/15/2015  . Osteoporosis 08/15/2015  . 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 DDD (degenerative disc disease), lumbar 08/15/2015  . Occupational exposure to industrial toxins 08/15/2015  . GERD (gastroesophageal reflux disease) 08/15/2015    Past Surgical History:  Procedure Laterality Date  . ABDOMINAL HYSTERECTOMY     ovaries left  . APPENDECTOMY    . CHOLECYSTECTOMY    . IR GENERIC HISTORICAL  11/07/2015   IR ANGIO INTRA EXTRACRAN SEL COM CAROTID INNOMINATE BILAT MOD SED 11/07/2015 Luanne Bras, MD [facility]  . IR GENERIC HISTORICAL  11/07/2015   IR ANGIO VERTEBRAL SEL SUBCLAVIAN INNOMINATE UNI R MOD SED 11/07/2015 Luanne Bras, MD [facility]  .  IR GENERIC HISTORICAL  11/07/2015   IR ANGIO VERTEBRAL SEL VERTEBRAL UNI L MOD SED 11/07/2015 Luanne Bras, MD [facility]  . IR GENERIC HISTORICAL  11/07/2015   IR ANGIOGRAM EXTREMITY LEFT 11/07/2015 Luanne Bras, MD [facility]  . TONSILLECTOMY    . TOTAL KNEE ARTHROPLASTY Left 03/12/2017   Procedure: LEFT TOTAL KNEE ARTHROPLASTY;  Surgeon: Mcarthur Rossetti, MD;  Location: [facility];  Service: Orthopedics;  Laterality: Left;  Adductor Block     OB History   None      Home Medications    Prior to Admission medications   Medication Sig Start  Date End Date Taking? Authorizing Provider  AMITIZA 8 MCG capsule Take 1 capsule by mouth 2 (two) times daily. 04/16/17   [provider]  amitriptyline (ELAVIL) 50 MG tablet Take 1 tablet (50 mg total) by mouth at bedtime. 05/28/17   Binnie Rail, MD  aspirin EC 81 MG tablet Take 1 tablet (81 mg total) by mouth daily. 11/01/15   Elgergawy, Silver Huguenin, MD  atorvastatin (LIPITOR) 20 MG tablet TAKE 1 TABLET(20 MG) BY MOUTH DAILY 07/09/17   Binnie Rail, MD  budesonide-formoterol [facility]) 80-4.5 MCG/ACT inhaler Inhale 2 puffs into the lungs 2 (two) times daily. 09/21/17   Binnie Rail, MD  cloNIDine (CATAPRES) 0.3 MG tablet Take 1 tablet (0.3 mg total) by mouth daily. 07/07/17   Binnie Rail, MD  clotrimazole (LOTRIMIN) 1 % cream Apply to affected area 2 times daily 09/28/17   Nuala Alpha A, PA-C  COMBIVENT RESPIMAT 20-100 MCG/ACT AERS respimat Inhale 1 puff by mouth every 6 hours 06/28/17   Burns, Claudina Lick, MD  Dulaglutide (TRULICITY) 1.5 PR/9.1MB SOPN Inject 1.5 mg into the skin once a week. Patient taking differently: Inject 1.5 mg every Sunday into the skin.  11/03/16   Binnie Rail, MD  erythromycin [facility]) ophthalmic ointment Place 1 application into the right eye at bedtime. 09/21/17   Binnie Rail, MD  escitalopram (LEXAPRO) 10 MG tablet Take 1 tablet (10 mg total) by mouth daily. 12/25/16 03/25/17  Biagio Borg, MD  fluticasone (FLONASE) 50 MCG/ACT nasal spray SHAKE LIQUID AND INSTILL 1 SPRAY INTO EACH NOSTRIL ONCE DAILY AS NEEDED FOR ALLERGIES 02/15/17   Binnie Rail, MD  furosemide (LASIX) 80 MG tablet Take 1 tablet (80 mg total) by mouth daily. 07/07/17   Binnie Rail, MD  gabapentin (NEURONTIN) 600 MG tablet Take 2 tablets (1,200 mg total) by mouth 3 (three) times daily. 06/02/17   Binnie Rail, MD  glucose blood test strip Use as instructed to check sugar three times a day 01/28/17   Binnie Rail, MD  hydrocortisone (ANUSOL-HC) 2.5 % rectal cream Apply rectally 2  times daily 09/28/17   Nuala Alpha A, PA-C  Lancets MISC Use as directed twice per day 01/28/17   Binnie Rail, MD  levocetirizine (XYZAL) 5 MG tablet TAKE 1 TABLET(5 MG) BY MOUTH EVERY EVENING 08/05/17   Hoyt Koch, MD  losartan (COZAAR) 50 MG tablet Take 1 tablet (50 mg total) by mouth 2 (two) times daily. 07/07/17   Binnie Rail, MD  metFORMIN (GLUCOPHAGE-XR) 500 MG 24 hr tablet TAKE 2 TABLETS BY MOUTH 2  TIMES DAILY WITH A MEAL. 08/06/17   Burns, Claudina Lick, MD  methocarbamol (ROBAXIN) 500 MG tablet Take 1 tablet (500 mg total) by mouth every 6 (six) hours as needed for muscle spasms. 03/15/17   Mcarthur Rossetti, MD  metoprolol (TOPROL-XL) 200 MG 24 hr tablet Take 1 tablet (200 mg total) by mouth 2 (two) times daily. 07/07/17   Binnie Rail, MD  nitroGLYCERIN (NITROSTAT) 0.4 MG SL tablet Place 0.4 mg under the tongue every 5 (five) minutes as needed for chest pain.    [provider]  omeprazole (PRILOSEC) 40 MG capsule Take 1 capsule (40 mg total) by mouth daily. 09/21/17   Binnie Rail, MD  oxyCODONE (OXYCONTIN) 15 mg 12 hr tablet Take 15 mg by mouth every 6 (six) hours.    [provider]  potassium chloride SA (K-DUR,KLOR-CON) 20 MEQ tablet Take 1 tablet (20 mEq total) by mouth daily. 07/07/17   Binnie Rail, MD  rivaroxaban (XARELTO) 10 MG TABS tablet Take 1 tablet (10 mg total) by mouth daily with breakfast. 03/15/17   Mcarthur Rossetti, MD  tiZANidine (ZANAFLEX) 4 MG tablet Take 2.5 tablets (10 mg total) by mouth 3 (three) times daily. 08/30/17   Binnie Rail, MD    Family History Family History  Problem Relation Age of Onset  . Hyperlipidemia Mother   . Hypertension Mother   . Hyperlipidemia Father   . Hypertension Father   . Stroke Father   . Hypertension Sister   . Cancer Sister        breast cancer  . Crohn's disease Sister     Social History Social History   Tobacco Use  . Smoking status: Current Every Day Smoker     Packs/day: 0.25    Years: 35.00    Pack years: 8.75    Types: Cigarettes  . Smokeless tobacco: Never Used  . Tobacco comment: referred  to smoking  cessation  classes. at  [facility]   Substance Use Topics  . Alcohol use: No    Alcohol/week: 1.2 - 1.8 oz    Types: 2 - 3 Standard drinks or equivalent per week  . 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      Allergies   Patient has no known allergies.   Review of Systems Review of Systems  Constitutional: Negative.  Negative for chills, fatigue and fever.  HENT: Negative.  Negative for congestion, ear pain, rhinorrhea, sore throat and trouble swallowing.   Eyes: Negative.  Negative for visual disturbance.  Respiratory: Negative.  Negative for cough, chest tightness and shortness of breath.   Cardiovascular: Negative.  Negative for chest pain and leg swelling.  Gastrointestinal: Negative.  Negative for abdominal pain, anal bleeding, blood in stool, diarrhea, nausea and vomiting.       Rectal mass  Genitourinary: Negative.  Negative for difficulty urinating, dysuria, flank pain, hematuria, pelvic pain and vaginal bleeding.  Musculoskeletal: Negative.  Negative for arthralgias, myalgias and neck pain.  Skin: Positive for rash.       Circular pruritic rash on right hand.  Neurological: Negative.  Negative for dizziness, syncope, weakness, light-headedness and headaches.     Physical Exam Updated Vital Signs BP (!) 146/95   Pulse 89   Temp 98 F (36.7 C) (Oral)   Resp 18   SpO2 98%   Physical Exam  Constitutional: She is oriented to person, place, and time. She appears well-developed and well-nourished.  HENT:  Head: Normocephalic and atraumatic.  Neck: Normal range of motion. Neck supple. No tracheal deviation present.  Cardiovascular: Normal rate, regular rhythm, normal heart sounds and intact distal pulses.  Pulmonary/Chest: Effort normal and breath sounds normal. No respiratory distress. She has no wheezes.  Abdominal:  Soft. Bowel sounds are normal. There is no tenderness. There is no rebound.  Genitourinary:    Pelvic exam was performed with patient in the knee-chest position.  Genitourinary Comments: See attached diagram.  Musculoskeletal: Normal range of motion. She exhibits no edema, tenderness or deformity.  Neurological: She is alert and oriented to person, place, and time.  Skin: Skin is warm and dry.     Psychiatric: She has a normal mood and affect. Her behavior is normal.  Rectal exam performed with Providence Lanius, PA-C in room as chaperone.   ED Treatments / Results  Labs (all labs ordered are listed, but only abnormal results are displayed) Labs Reviewed - No data to display  EKG None  Radiology No results found.  Procedures Procedures (including critical care time)  Medications Ordered in ED Medications - No data to display   Initial Impression / Assessment and Plan / ED Course  I have reviewed the triage vital signs and the nursing notes.  Pertinent labs & imaging results that were available during my care of the patient were reviewed by me and considered in my medical decision making (see chart for details).     External hemorrhoid: After examination patient's rectal mass revealed to be an external hemorrhoid.  No sign of hemorrhage or infection, negative for warmth, erythema, discharge, bleeding.  Patient given referral to general surgery follow-up on hemorrhoid.  Anusol prescription given for treatment of symptoms.  Patient encouraged to continue using sits baths and stool softeners.  Patient given extensive return precautions, patient states understanding return precautions and states she will follow-up with general surgery.  Tinea corporis: Patient's pruritic rash on dorsum of right hand consistent with appearance of tinea corporis.  Patient states that she has had this in the past and that clotrimazole has worked well for her.  Patient given prescription for  clotrimazole.  Patient encouraged to follow-up with her primary care provider for rash.  Given extensive return precautions, patient states understanding return precautions and states that she will follow-up with her primary care provider.  Patient informed to return to the emergency department for any new or worsening symptoms.  Patient given all return precautions in after visit summary.  Final Clinical Impressions(s) / ED Diagnoses   Final diagnoses:  External hemorrhoid  Tinea corporis    ED Discharge Orders        Ordered    clotrimazole (LOTRIMIN) 1 % cream     09/28/17 1651    hydrocortisone (ANUSOL-HC) 2.5 % rectal cream     09/28/17 1651       Gari Crown 09/28/17 1725    Quintella Reichert, MD 09/29/17 1330

## 2017-09-28 NOTE — Discharge Instructions (Addendum)
Contact a doctor if: You have any of these: Pain and swelling that do not get better with treatment or medicine. Bleeding that will not stop. Trouble pooping or you cannot poop. Pain or swelling outside the area of the hemorrhoids. Contact a health care provider if: Your rash continues to spread after 7 days of treatment. Your rash is not gone in 4 weeks. The area around your rash gets red, warm, tender, and swollen.

## 2017-09-30 ENCOUNTER — Encounter: Payer: Self-pay | Admitting: Podiatry

## 2017-09-30 ENCOUNTER — Ambulatory Visit (INDEPENDENT_AMBULATORY_CARE_PROVIDER_SITE_OTHER): Payer: Medicare Other | Admitting: Podiatry

## 2017-09-30 DIAGNOSIS — E1149 Type 2 diabetes mellitus with other diabetic neurological complication: Secondary | ICD-10-CM | POA: Diagnosis not present

## 2017-09-30 DIAGNOSIS — M79674 Pain in right toe(s): Secondary | ICD-10-CM

## 2017-09-30 DIAGNOSIS — L603 Nail dystrophy: Secondary | ICD-10-CM | POA: Diagnosis not present

## 2017-09-30 DIAGNOSIS — M79675 Pain in left toe(s): Secondary | ICD-10-CM | POA: Diagnosis not present

## 2017-09-30 NOTE — Patient Instructions (Signed)
Soak Instructions    THE DAY AFTER THE PROCEDURE  Place 1/4 cup of epsom salts in a quart of warm tap water.  Submerge your foot or feet with outer bandage intact for the initial soak; this will allow the bandage to become moist and wet for easy lift off.  Once you remove your bandage, continue to soak in the solution for 20 minutes.  This soak should be done twice a day.  Next, remove your foot or feet from solution, blot dry the affected area and cover.  You may use a band aid large enough to cover the area or use gauze and tape.  Apply other medications to the area as directed by the doctor such as polysporin neosporin.  IF YOUR SKIN BECOMES IRRITATED WHILE USING THESE INSTRUCTIONS, IT IS OKAY TO SWITCH TO  WHITE VINEGAR AND WATER. Or you may use antibacterial soap and water to keep the toe clean  Monitor for any signs/symptoms of infection. Call the office immediately if any occur or go directly to the emergency room. Call with any questions/concerns.   Pre-Operative Instructions  Congratulations, you have decided to take an important step to improving your quality of life.  You can be assured that the doctors of [facility] will be with you every step of the way.  1. Plan to be at the surgery center/hospital at least 1 (one) hour prior to your scheduled time unless otherwise directed by the surgical center/hospital staff.  You must have a responsible adult accompany you, remain during the surgery and drive you home.  Make sure you have directions to the surgical center/hospital and know how to get there on time. 2. For hospital based surgery you will need to obtain a history and physical form from your family physician within 1 month prior to the date of surgery- we will give you a form for you primary physician.  3. We make every effort to accommodate the date you request for surgery.  There are however, times where surgery dates or times have to be moved.  We will contact you as soon  as possible if a change in schedule is required.   4. No Aspirin/Ibuprofen for one week before surgery.  If you are on aspirin, any non-steroidal anti-inflammatory medications (Mobic, Aleve, Ibuprofen) you should stop taking it 7 days prior to your surgery.  You make take Tylenol  For pain prior to surgery.  5. Medications- If you are taking daily heart and blood pressure medications, seizure, reflux, allergy, asthma, anxiety, pain or diabetes medications, make sure the surgery center/hospital is aware before the day of surgery so they may notify you which medications to take or avoid the day of surgery. 6. No food or drink after midnight the night before surgery unless directed otherwise by surgical center/hospital staff. 7. No alcoholic beverages 24 hours prior to surgery.  No smoking 24 hours prior to or 24 hours after surgery. 8. Wear loose pants or shorts- loose enough to fit over bandages, boots, and casts. 9. No slip on shoes, sneakers are best. 10. Bring your boot with you to the surgery center/hospital.  Also bring crutches or a walker if your physician has prescribed it for you.  If you do not have this equipment, it will be provided for you after surgery. 11. If you have not been contracted by the surgery center/hospital by the day before your surgery, call to confirm the date and time of your surgery. 12. Leave-time from work may  vary depending on the type of surgery you have.  Appropriate arrangements should be made prior to surgery with your employer. 13. Prescriptions will be provided immediately following surgery by your doctor.  Have these filled as soon as possible after surgery and take the medication as directed. 14. Remove nail polish on the operative foot. 15. Wash the night before surgery.  The night before surgery wash the foot and leg well with the antibacterial soap provided and water paying special attention to beneath the toenails and in between the toes.  Rinse thoroughly  with water and dry well with a towel.  Perform this wash unless told not to do so by your physician.  Enclosed: 1 Ice pack (please put in freezer the night before surgery)   1 Hibiclens skin cleaner   Pre-op Instructions  If you have any questions regarding the instructions, do not hesitate to call our office at any point during this process.   [place]: [place], [place] [address] [place]: [address], [place], [place] [address] [phone]  Dr. Celesta Gentile, DPM

## 2017-10-01 NOTE — Progress Notes (Addendum)
Subjective: 57 year old female presents the office with concerns of painful bilateral big and second toenails.  She said this been ongoing for some time and to try to trim them previously but they continue to be very painful she cannot wear shoes.  She denies any drainage or pus coming from the areas.  Denies any swelling or redness but the area is very tender to touch.  She also discussed other treatment options for the nails at this time. Denies any systemic complaints such as fevers, chills, nausea, vomiting. No acute changes since last appointment, and no other complaints at this time.   Last A1c 6.5  Objective: AAO x3, NAD DP/PT pulses palpable bilaterally, CRT less than 3 seconds All of her nails are hypertrophic, dystrophic and discolored with dark discoloration however bilateral first and second digit toenails are most hypertrophic and dystrophic and they are tender to palpate the entire toenail.  There is no edema, erythema, drainage or pus there is no clinical signs of infection.  No open lesions or pre-ulcerative lesions.  No pain with calf compression, swelling, warmth, erythema  Assessment: Painful onychodystrophy   Plan: -All treatment options discussed with the patient including all alternatives, risks, complications.  -We discussed various treatment options for this.  After the long discussion regards to options both conservative and surgical she elects to proceed with toenail removal with chemical matricectomy to help prevent coming back again to the fourth digit toenails given the pain.  We discussed the surgery as well as the postoperative course.  We will plan to do this in the operating room next week under light sedation.  We discussed risks of the procedure including infection, amputation, the nail can come back and come back to be thicker or continued pain. -Patient encouraged to call the office with any questions, concerns, change in symptoms.   Tammy Strickland  DPM  Addendum: I did confim with her that she wanted to have the Left and Right 1st toenails removed and the right 2nd and 3rd. On the day of her surgery she told me to "remove what I think needs to be removed". I confirmed her consent form with her to the nails she is consented for.

## 2017-10-04 ENCOUNTER — Ambulatory Visit: Payer: Medicare Other | Admitting: Physical Therapy

## 2017-10-04 ENCOUNTER — Telehealth: Payer: Self-pay | Admitting: Internal Medicine

## 2017-10-04 DIAGNOSIS — Z79891 Long term (current) use of opiate analgesic: Secondary | ICD-10-CM | POA: Diagnosis not present

## 2017-10-04 DIAGNOSIS — G894 Chronic pain syndrome: Secondary | ICD-10-CM | POA: Diagnosis not present

## 2017-10-04 DIAGNOSIS — M25559 Pain in unspecified hip: Secondary | ICD-10-CM | POA: Diagnosis not present

## 2017-10-04 DIAGNOSIS — M545 Low back pain: Secondary | ICD-10-CM | POA: Diagnosis not present

## 2017-10-04 DIAGNOSIS — J449 Chronic obstructive pulmonary disease, unspecified: Secondary | ICD-10-CM | POA: Diagnosis not present

## 2017-10-04 DIAGNOSIS — M25562 Pain in left knee: Secondary | ICD-10-CM | POA: Diagnosis not present

## 2017-10-04 MED ORDER — FLUCONAZOLE 150 MG PO TABS: 150.0000 mg | ORAL_TABLET | Freq: Once | ORAL | 0 refills | Status: AC

## 2017-10-04 NOTE — Telephone Encounter (Signed)
Copied from [place] [id number]. Topic: Quick Communication - See Telephone Encounter >> Oct 04, 2017  3:31 PM Rutherford Nail, NT wrote: CRM for notification. See Telephone encounter for: 10/04/17. Patient calling and is requesting diflucan be sent to the pharmacy for a yeast infection. States that she is having itching in her vaginal area. Informed her that Dr Quay Burow would likely need to see her in the off for this issue if she has not been seen. Patient states that she has too many appointments to be able to make it to the office at this time.  [id number] - [place], [place] CB#: [phone]

## 2017-10-04 NOTE — Telephone Encounter (Signed)
Diflucan sent to her pharmacy

## 2017-10-05 NOTE — Telephone Encounter (Signed)
She needs to wait longer - should not take another dose until 3 days later

## 2017-10-05 NOTE — Telephone Encounter (Signed)
Patient states that she took the prescription this morning and it has helped a little bit but not fully.. She thinks she may need another dose of Diflucan. I told her I was not sure how long it would take for this to fully take effect.  Please advise.

## 2017-10-05 NOTE — Telephone Encounter (Signed)
Spoke with pt to inform.  

## 2017-10-06 DIAGNOSIS — K645 Perianal venous thrombosis: Secondary | ICD-10-CM | POA: Diagnosis not present

## 2017-10-06 DIAGNOSIS — L290 Pruritus ani: Secondary | ICD-10-CM | POA: Diagnosis not present

## 2017-10-10 DIAGNOSIS — M791 Myalgia, unspecified site: Secondary | ICD-10-CM | POA: Diagnosis not present

## 2017-10-12 ENCOUNTER — Telehealth: Payer: Self-pay | Admitting: Internal Medicine

## 2017-10-12 DIAGNOSIS — E119 Type 2 diabetes mellitus without complications: Secondary | ICD-10-CM | POA: Diagnosis not present

## 2017-10-12 NOTE — Telephone Encounter (Signed)
Copied from [place] [id number]. Topic: Quick Communication - Rx Refill/Question >> Oct 12, 2017  5:48 PM Neva Seat wrote: Omega lidocaine ointment   Mometasone nasal spray  Needing approval  [place] [phone]

## 2017-10-13 MED ORDER — LIDOCAINE 5 % EX OINT: 1.0000 "application " | TOPICAL_OINTMENT | CUTANEOUS | 0 refills | Status: DC | PRN

## 2017-10-13 MED ORDER — OMEGA 3 1200 MG PO CAPS: ORAL_CAPSULE | ORAL | 1 refills | Status: DC

## 2017-10-13 MED ORDER — MOMETASONE FUROATE 50 MCG/ACT NA SUSP: 2.0000 | Freq: Every day | NASAL | 1 refills | Status: DC

## 2017-10-13 NOTE — Telephone Encounter (Signed)
Sent refills to [facility] pharmacy.Marland KitchenJohny Strickland

## 2017-10-13 NOTE — Telephone Encounter (Signed)
Pls advise if ok to send. Do not see on pt med list../lmb

## 2017-10-13 NOTE — Addendum Note (Signed)
Addended by: Earnstine Regal on: 10/13/2017 11:29 AM   Modules accepted: Orders

## 2017-10-13 NOTE — Telephone Encounter (Signed)
Ok to send    thanks

## 2017-10-19 ENCOUNTER — Ambulatory Visit: Payer: Medicare Other | Attending: Physical Therapy | Admitting: Physical Therapy

## 2017-10-20 ENCOUNTER — Other Ambulatory Visit: Payer: Self-pay | Admitting: Podiatry

## 2017-10-20 ENCOUNTER — Telehealth: Payer: Self-pay | Admitting: Podiatry

## 2017-10-20 ENCOUNTER — Encounter: Payer: Self-pay | Admitting: Podiatry

## 2017-10-20 DIAGNOSIS — M79675 Pain in left toe(s): Secondary | ICD-10-CM | POA: Diagnosis not present

## 2017-10-20 DIAGNOSIS — M79674 Pain in right toe(s): Secondary | ICD-10-CM | POA: Diagnosis not present

## 2017-10-20 DIAGNOSIS — L600 Ingrowing nail: Secondary | ICD-10-CM | POA: Diagnosis not present

## 2017-10-20 DIAGNOSIS — E7800 Pure hypercholesterolemia, unspecified: Secondary | ICD-10-CM | POA: Diagnosis not present

## 2017-10-20 DIAGNOSIS — L603 Nail dystrophy: Secondary | ICD-10-CM | POA: Diagnosis not present

## 2017-10-20 DIAGNOSIS — B351 Tinea unguium: Secondary | ICD-10-CM | POA: Diagnosis not present

## 2017-10-20 MED ORDER — HYDROCODONE-ACETAMINOPHEN 5-325 MG PO TABS: 1.0000 | ORAL_TABLET | ORAL | 0 refills | Status: DC | PRN

## 2017-10-20 MED ORDER — CEPHALEXIN 500 MG PO CAPS: 500.0000 mg | ORAL_CAPSULE | Freq: Three times a day (TID) | ORAL | 0 refills | Status: DC

## 2017-10-20 NOTE — Telephone Encounter (Signed)
Left message instructing [facility] [phone] to discontinue the Norco 5/325mg  rx pt could take the previously prescribed Percocet.

## 2017-10-20 NOTE — Telephone Encounter (Signed)
Then she should just continue with the percocet as prescribed.

## 2017-10-20 NOTE — Telephone Encounter (Signed)
This is [place] calling about a prescription Dr. Jacqualyn Posey sent over. It is for 15 Vicodin or norco 5-325 tablets. The pt has received percocet 10-325, 180 tablets, a 30 day supply on 26 June. So I just wanted to call and let you know this medication is not due until about 24 July. If you have any questions or want to let us know how to proceed further, please give [place] a call at [phone]. Thank you so much. Bye.

## 2017-10-20 NOTE — Telephone Encounter (Signed)
Left message informing pt the antibiotic cream neosporin could be purchased at the pharmacy or grocery store without a prescription. Dr. Jacqualyn Posey states pt can use the percocet she was prescribed for pain.

## 2017-10-20 NOTE — Progress Notes (Signed)
Pre-operative Note  Patient presents to the [facility] today for surgical intervention of the permanent removal of bilateral hallux as well as right second and third digit toenails The surgical consent was reviewed with the patient and we discussed the procedure as well as the postoperative course. I again discussed all alternatives, risks, complications. I answered all of their questions to the best of my ability and they wish to proceed with surgery. No promises or guarantees were given as to the outcome of the surgery.   The surgical consent was signed.   Patient is NPO since midnight.  The patient does not have have a history of blood clots or bleeding disorders.   Postoperative medications at the pharmacy.  I had her verbally and visually identified the appropriate toenails that we were doing today and we were in agreement to the fourth toenails we are doing.  No further questions.   Celesta Gentile, [place]

## 2017-10-20 NOTE — Telephone Encounter (Signed)
I had surgery this morning on my feet. The paper that they gave me said something about applying antibiotic ointment on cream on my feet when I get ready to soak them. They did not send no antibiotic cream. My pharmacy is [facility] on [address] and you can call it in because evidently he didn't call that one in. I do have my antibiotic pills to take but he said after I soak my feet to use antibiotic on my feet but I did not get none. Will you please tell Dr. Jacqualyn Posey to call Tammy Strickland back at [phone]. Thank you and have a blessed day.

## 2017-10-21 ENCOUNTER — Other Ambulatory Visit: Payer: Self-pay | Admitting: *Deleted

## 2017-10-21 ENCOUNTER — Telehealth: Payer: Self-pay | Admitting: Podiatry

## 2017-10-21 ENCOUNTER — Other Ambulatory Visit: Payer: Self-pay | Admitting: Podiatry

## 2017-10-21 MED ORDER — FLUCONAZOLE 150 MG PO TABS: 150.0000 mg | ORAL_TABLET | Freq: Once | ORAL | 0 refills | Status: AC

## 2017-10-21 MED ORDER — MUPIROCIN 2 % EX OINT: 1.0000 "application " | TOPICAL_OINTMENT | Freq: Two times a day (BID) | CUTANEOUS | 0 refills | Status: DC

## 2017-10-21 MED ORDER — SULFAMETHOXAZOLE-TRIMETHOPRIM 800-160 MG PO TABS: 1.0000 | ORAL_TABLET | Freq: Two times a day (BID) | ORAL | 0 refills | Status: DC

## 2017-10-21 NOTE — Telephone Encounter (Signed)
I have some ointment here and I was wondering if it was okay to put that on my toes and wrap it with the bandage? Call me back at [phone].

## 2017-10-21 NOTE — Telephone Encounter (Signed)
I told pt the antibiotic ointment needed to be put on the bandaid then placed over the areas where toenail was removed. Pt asked if the antibiotic had been called in and I told her the medication had been called to the [facility].

## 2017-10-22 ENCOUNTER — Telehealth: Payer: Self-pay | Admitting: *Deleted

## 2017-10-22 NOTE — Telephone Encounter (Signed)
Called and spoke with patient after the surgery on Wednesday with Dr Jacqualyn Posey, patient stated that she was doing better and did get the RX's yesterday after we had spoken earlier in the day yesterday and is doing better today and I stated to call the office if any concerns or questions. [name]

## 2017-10-23 DIAGNOSIS — R42 Dizziness and giddiness: Secondary | ICD-10-CM | POA: Diagnosis not present

## 2017-10-23 DIAGNOSIS — R0902 Hypoxemia: Secondary | ICD-10-CM | POA: Diagnosis not present

## 2017-10-23 DIAGNOSIS — W19XXXA Unspecified fall, initial encounter: Secondary | ICD-10-CM | POA: Diagnosis not present

## 2017-10-23 DIAGNOSIS — R9431 Abnormal electrocardiogram [ECG] [EKG]: Secondary | ICD-10-CM | POA: Diagnosis not present

## 2017-10-25 ENCOUNTER — Ambulatory Visit (INDEPENDENT_AMBULATORY_CARE_PROVIDER_SITE_OTHER): Payer: Medicare Other | Admitting: Podiatry

## 2017-10-25 ENCOUNTER — Other Ambulatory Visit: Payer: Self-pay | Admitting: Internal Medicine

## 2017-10-25 DIAGNOSIS — L603 Nail dystrophy: Secondary | ICD-10-CM

## 2017-10-25 DIAGNOSIS — Z9889 Other specified postprocedural states: Secondary | ICD-10-CM

## 2017-10-25 DIAGNOSIS — E1149 Type 2 diabetes mellitus with other diabetic neurological complication: Secondary | ICD-10-CM

## 2017-10-25 MED ORDER — MUPIROCIN 2 % EX OINT: 1.0000 "application " | TOPICAL_OINTMENT | Freq: Two times a day (BID) | CUTANEOUS | 0 refills | Status: DC

## 2017-10-26 NOTE — Progress Notes (Signed)
Subjective: Tammy Strickland is a 57 y.o.  female returns to office today for follow up evaluation after having bilateral hallux and right 2nd and 3rd total nail avulsion performed. Patient has been soaking using epsom salts and applying topical antibiotic covered with bandaid daily. Patient denies fevers, chills, nausea, vomiting. Denies any calf pain, chest pain, SOB.   Objective:  Vitals: Reviewed  General: Well developed, nourished, in no acute distress, alert and oriented x3   Dermatology: Skin is warm, dry and supple bilateral. Bilateral nail bedsappears to be clean, dry, with mild granular tissue.  There is no surrounding erythema, edema, drainage/purulence. The remaining nails appear unremarkable at this time. There are no other lesions or other signs of infection present.  Neurovascular status: Intact. No lower extremity swelling; No pain with calf compression bilateral.  Musculoskeletal: Mild tenderness to palpation of the nail folds. Muscular strength within normal limits bilateral.   Assesement and Plan: S/p partial nail avulsion, doing well.   -Continue soaking in epsom salts twice a day followed by antibiotic ointment and a band-aid. Can leave uncovered at night. Continue this until completely healed.  -Finish course of antibiotics.  -Remain in surgical shoes -Encouraged glucose control  -Monitor for any signs/symptoms of infection. Call the office immediately if any occur or go directly to the emergency room. Call with any questions/concerns.  RTC 1 week or sooner if needed.   Celesta Gentile, DPM

## 2017-10-28 ENCOUNTER — Ambulatory Visit: Payer: Self-pay | Admitting: Internal Medicine

## 2017-11-01 ENCOUNTER — Telehealth: Payer: Self-pay | Admitting: Emergency Medicine

## 2017-11-01 ENCOUNTER — Other Ambulatory Visit: Payer: Self-pay | Admitting: Internal Medicine

## 2017-11-01 NOTE — Telephone Encounter (Signed)
Copied from [place] [id number]. Topic: Quick Communication - See Telephone Encounter >> Nov 01, 2017 10:31 AM Antonieta Iba C wrote: CRM for notification. See Telephone encounter for: 11/01/17.  Hollie w/ [employer] [facility]) called in to confirm if pt's oxygen has been discontinued?  Please advise: [phone]- phone   [phone] - Fax

## 2017-11-01 NOTE — Telephone Encounter (Signed)
Would we be the ones to D/c this?

## 2017-11-01 NOTE — Progress Notes (Deleted)
Subjective:   Tammy Strickland is a 57 y.o. female who presents for Medicare Annual (Subsequent) preventive examination.  Review of Systems:  No ROS.  Medicare Wellness Visit. Additional risk factors are reflected in the social history.    Sleep patterns: {SX; SLEEP PATTERNS:18802::"feels rested on waking","does not get up to void","gets up *** times nightly to void","sleeps *** hours nightly"}.    Home Safety/Smoke Alarms: Feels safe in home. Smoke alarms in place.  Living environment; residence and Firearm Safety: {Rehab home environment / accessibility:30080::"no firearms","firearms stored safely"}. Seat Belt Safety/Bike Helmet: Wears seat belt.    Objective:     Vitals: There were no vitals taken for this visit.  There is no height or weight on file to calculate BMI.  Advanced Directives 03/22/2017 03/12/2017 03/09/2017 03/03/2017 02/15/2017 02/11/2017 12/24/2016  Does Patient Have a Medical Advance Directive? No No No No No No No  Would patient like information on creating a medical advance directive? - No - Patient declined No - Patient declined No - Patient declined - - No - Patient declined    Tobacco Social History   Tobacco Use  Smoking Status Current Every Day Smoker  . Packs/day: 0.25  . Years: 35.00  . Pack years: 8.75  . Types: Cigarettes  Smokeless Tobacco Never Used  Tobacco Comment   referred  to smoking  cessation  classes. at  [facility]      Ready to quit: Not Answered Counseling given: Not Answered Comment: referred  to smoking  cessation  classes. at  [facility]   Past Medical History:  Diagnosis Date  . Anxiety   . Arthritis   . CHF (congestive heart failure) ([place])   . COPD (chronic obstructive pulmonary disease) ([place])   . Coronary artery disease   . Depression   . Diabetes mellitus without complication ([place])    type 2   . GERD (gastroesophageal reflux disease)   . Hypertension   . Lupus ([place])   . Neuromuscular disorder ([place])   . Osteoporosis   .  Oxygen deficiency    pt uses 2.5L 02 at night   . Peripheral neuropathy   . Sleep apnea    had sleep study done recently ; unaware if she will be getting  a CPAP device ; patient states "im pretty sure i have it , i fall alseep all the time "  . Stroke [facility]) 10/2015   Past Surgical History:  Procedure Laterality Date  . ABDOMINAL HYSTERECTOMY     ovaries left  . APPENDECTOMY    . CHOLECYSTECTOMY    . IR GENERIC HISTORICAL  11/07/2015   IR ANGIO INTRA EXTRACRAN SEL COM CAROTID INNOMINATE BILAT MOD SED 11/07/2015 Luanne Bras, MD [facility]  . IR GENERIC HISTORICAL  11/07/2015   IR ANGIO VERTEBRAL SEL SUBCLAVIAN INNOMINATE UNI R MOD SED 11/07/2015 Luanne Bras, MD [facility]  . IR GENERIC HISTORICAL  11/07/2015   IR ANGIO VERTEBRAL SEL VERTEBRAL UNI L MOD SED 11/07/2015 Luanne Bras, MD [facility]  . IR GENERIC HISTORICAL  11/07/2015   IR ANGIOGRAM EXTREMITY LEFT 11/07/2015 Luanne Bras, MD [facility]  . TONSILLECTOMY    . TOTAL KNEE ARTHROPLASTY Left 03/12/2017   Procedure: LEFT TOTAL KNEE ARTHROPLASTY;  Surgeon: Mcarthur Rossetti, MD;  Location: [facility];  Service: Orthopedics;  Laterality: Left;  Adductor Block   Family History  Problem Relation Age of Onset  . Hyperlipidemia Mother   . Hypertension Mother   . Hyperlipidemia Father   .  Hypertension Father   . Stroke Father   . Hypertension Sister   . Cancer Sister        breast cancer  . Crohn's disease Sister    Social History   Socioeconomic History  . Marital status: Single    Spouse name: n/a  . Number of children: 3  . Years of education: 12+  . Highest education level: Not on file  Occupational History  . 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 Financial resource strain: Not on file  . Food insecurity:    Worry: Not on file    Inability: Not on file  . Transportation needs:    Medical:  Not on file    Non-medical: Not on file  Tobacco Use  . Smoking status: Current Every Day Smoker    Packs/day: 0.25    Years: 35.00    Pack years: 8.75    Types: Cigarettes  . Smokeless tobacco: Never Used  . Tobacco comment: referred  to smoking  cessation  classes. at  [facility]   Substance and Sexual Activity  . Alcohol use: No    Alcohol/week: 1.2 - 1.8 oz    Types: 2 - 3 Standard drinks or equivalent per week  . 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   Marland Kitchen Sexual activity: Yes    Partners: Female  Lifestyle  . Physical activity:    Days per week: Not on file    Minutes per session: Not on file  . Stress: Not on file  Relationships  . Social connections:    Talks on phone: Not on file    Gets together: Not on file    Attends religious service: Not on file    Active member of club or organization: Not on file    Attends meetings of clubs or organizations: Not on file    Relationship status: Not on file  Other Topics Concern  . Not on file  Social History Narrative   Moved to [place] from [place], [place] February 2017, to help her daughter.   Lives with her daughter.   Sons live in [place] and [place].   She reports that there were originally 17 children in her family (she is the youngest), the oldest are deceased, some prior to her birth, and she isn't sure which were female/female or how they died.    Outpatient Encounter Medications as of 11/02/2017  Medication Sig  . AMITIZA 8 MCG capsule Take 1 capsule by mouth 2 (two) times daily.  Marland Kitchen amitriptyline (ELAVIL) 50 MG tablet Take 1 tablet (50 mg total) by mouth at bedtime.  Marland Kitchen aspirin EC 81 MG tablet Take 1 tablet (81 mg total) by mouth daily.  Marland Kitchen atorvastatin (LIPITOR) 20 MG tablet TAKE 1 TABLET(20 MG) BY MOUTH DAILY  . budesonide-formoterol (SYMBICORT) 80-4.5 MCG/ACT inhaler Inhale 2 puffs into the lungs 2 (two) times daily.  . cephALEXin (KEFLEX) 500 MG capsule Take 1 capsule (500 mg total) by mouth 3 (three) times daily.    . cloNIDine (CATAPRES) 0.3 MG tablet Take 1 tablet (0.3 mg total) by mouth daily.  . clotrimazole (LOTRIMIN) 1 % cream Apply to affected area 2 times daily  . COMBIVENT RESPIMAT 20-100 MCG/ACT AERS respimat Inhale 1 puff by mouth every 6 hours  . [START ON 11/07/2017] Dulaglutide (TRULICITY) 1.5 GG/2.6RS SOPN Inject 1.5 mg into the skin every Sunday.  . erythromycin [facility]) ophthalmic  ointment Place 1 application into the right eye at bedtime.  Marland Kitchen escitalopram (LEXAPRO) 10 MG tablet Take 1 tablet (10 mg total) by mouth daily.  . fluticasone (FLONASE) 50 MCG/ACT nasal spray SHAKE LIQUID AND INSTILL 1 SPRAY INTO EACH NOSTRIL ONCE DAILY AS NEEDED FOR ALLERGIES  . furosemide (LASIX) 80 MG tablet Take 1 tablet (80 mg total) by mouth daily.  Marland Kitchen gabapentin (NEURONTIN) 600 MG tablet Take 2 tablets by mouth 3 times a day  . glucose blood test strip Use as instructed to check sugar three times a day  . HYDROcodone-acetaminophen (NORCO/VICODIN) 5-325 MG tablet Take 1 tablet by mouth every 4 (four) hours as needed.  . hydrocortisone (ANUSOL-HC) 2.5 % rectal cream Apply rectally 2 times daily  . Lancets MISC Use as directed twice per day  . levocetirizine (XYZAL) 5 MG tablet TAKE 1 TABLET(5 MG) BY MOUTH EVERY EVENING  . lidocaine (XYLOCAINE) 5 % ointment Apply 1 application topically as needed.  Marland Kitchen losartan (COZAAR) 50 MG tablet Take 1 tablet (50 mg total) by mouth 2 (two) times daily.  . metFORMIN (GLUCOPHAGE-XR) 500 MG 24 hr tablet TAKE 2 TABLETS BY MOUTH 2  TIMES DAILY WITH A MEAL.  . methocarbamol (ROBAXIN) 500 MG tablet Take 1 tablet (500 mg total) by mouth every 6 (six) hours as needed for muscle spasms.  . metoprolol (TOPROL-XL) 200 MG 24 hr tablet Take 1 tablet (200 mg total) by mouth 2 (two) times daily.  . mometasone (NASONEX) 50 MCG/ACT nasal spray Place 2 sprays into the nose daily.  . mupirocin ointment (BACTROBAN) 2 % Apply 1 application topically 2 (two) times daily.  . nitroGLYCERIN  (NITROSTAT) 0.4 MG SL tablet Place 0.4 mg under the tongue every 5 (five) minutes as needed for chest pain.  . Omega 3 1200 MG CAPS Take 1 capsule by mouth daily  . omeprazole (PRILOSEC) 40 MG capsule Take 1 capsule (40 mg total) by mouth daily.  Marland Kitchen oxyCODONE (OXYCONTIN) 15 mg 12 hr tablet Take 15 mg by mouth every 6 (six) hours.  . potassium chloride SA (K-DUR,KLOR-CON) 20 MEQ tablet Take 1 tablet (20 mEq total) by mouth daily.  . rivaroxaban (XARELTO) 10 MG TABS tablet Take 1 tablet (10 mg total) by mouth daily with breakfast.  . sulfamethoxazole-trimethoprim (BACTRIM DS,SEPTRA DS) 800-160 MG tablet Take 1 tablet by mouth 2 (two) times daily.  Marland Kitchen tiZANidine (ZANAFLEX) 4 MG tablet Take 2.5 tablets (10 mg total) by mouth 3 (three) times daily.  . traZODone (DESYREL) 50 MG tablet Take 1 tablet (50 mg total) by mouth at bedtime as needed for sleep.   No facility-administered encounter medications on file as of 11/02/2017.     Activities of Daily Living In your present state of health, do you have any difficulty performing the following activities: 03/12/2017 03/09/2017  Hearing? N N  Vision? N N  Difficulty concentrating or making decisions? N N  Walking or climbing stairs? Y Y  Dressing or bathing? N Y  Doing errands, shopping? N N  Some recent data might be hidden    Patient Care Team: Binnie Rail, MD as PCP - General (Internal Medicine) Greer Pickerel, MD as Consulting Physician (General Surgery)    Assessment:   This is a routine wellness examination for Marnell. Physical assessment deferred to PCP.   Exercise Activities and Dietary recommendations   Diet (meal preparation, eat out, water intake, caffeinated beverages, dairy products, fruits and vegetables): {Desc; diets:16563}   Goals    None  Fall Risk Fall Risk  12/24/2016 10/30/2016 11/26/2015 08/15/2015  Falls in the past year? No Yes Yes Yes  Number falls in past yr: - 2 or more 1 2 or more  Injury with Fall? - No -  -  Risk for fall due to : - Impaired vision;Impaired mobility;Impaired balance/gait - -    Depression Screen PHQ 2/9 Scores 10/30/2016 01/03/2016 11/26/2015 08/15/2015  PHQ - 2 Score 2 1 0 0  PHQ- 9 Score 6 9 - -     Cognitive Function        Immunization History  Administered Date(s) Administered  . Influenza,inj,Quad PF,6+ Mos 01/15/2016, 12/25/2016  . Pneumococcal Polysaccharide-23 04/16/2016    Screening Tests Health Maintenance  Topic Date Due  . OPHTHALMOLOGY EXAM  04/02/1972  . TETANUS/TDAP  04/02/1981  . MAMMOGRAM  04/02/2012  . COLONOSCOPY  04/02/2012  . INFLUENZA VACCINE  11/11/2017  . HEMOGLOBIN A1C  03/23/2018  . FOOT EXAM  09/13/2018  . PNEUMOCOCCAL POLYSACCHARIDE VACCINE (2) 04/16/2021  . Hepatitis C Screening  Completed  . HIV Screening  Completed      Plan:     I have personally reviewed and noted the following in the patient's chart:   . Medical and social history . Use of alcohol, tobacco or illicit drugs  . Current medications and supplements . Functional ability and status . Nutritional status . Physical activity . Advanced directives . List of other physicians . Vitals . Screenings to include cognitive, depression, and falls . Referrals and appointments  In addition, I have reviewed and discussed with patient certain preventive protocols, quality metrics, and best practice recommendations. A written personalized care plan for preventive services as well as general preventive health recommendations were provided to patient.     Michiel Cowboy, RN  11/01/2017

## 2017-11-02 ENCOUNTER — Ambulatory Visit: Payer: Medicare Other

## 2017-11-02 NOTE — Telephone Encounter (Signed)
LVM for pt to call back and discuss.  

## 2017-11-02 NOTE — Telephone Encounter (Signed)
I was under the impression she was still on this at night only - she did have a test with pulm that showed her oxygen level dropped.  She should remain on nighttime oxygen.

## 2017-11-03 DIAGNOSIS — J449 Chronic obstructive pulmonary disease, unspecified: Secondary | ICD-10-CM | POA: Diagnosis not present

## 2017-11-05 ENCOUNTER — Ambulatory Visit (INDEPENDENT_AMBULATORY_CARE_PROVIDER_SITE_OTHER): Payer: Medicare Other | Admitting: Podiatry

## 2017-11-05 DIAGNOSIS — L603 Nail dystrophy: Secondary | ICD-10-CM

## 2017-11-05 DIAGNOSIS — E1149 Type 2 diabetes mellitus with other diabetic neurological complication: Secondary | ICD-10-CM

## 2017-11-05 NOTE — Patient Instructions (Signed)
Continue soaking in epsom salts twice a day followed by antibiotic ointment and a band-aid. Can leave uncovered at night. Continue this until completely healed.  Monitor for any signs/symptoms of infection. Call the office immediately if any occur or go directly to the emergency room. Call with any questions/concerns.  

## 2017-11-06 NOTE — Progress Notes (Signed)
Subjective:    Patient ID: Tammy Strickland, female    DOB: 02/26/1961, 57 y.o.   MRN: [id number]  HPI    Medications and allergies reviewed with patient and updated if appropriate.  Patient Active Problem List   Diagnosis Date Noted  . Stye external 09/21/2017  . Cough 08/05/2017  . Dysphagia 04/29/2017  . History of left knee replacement 04/21/2017  . Pain in left ankle and joints of left foot 04/21/2017  . Pain in left hand 04/21/2017  . Status post total left knee replacement 03/12/2017  . OSA (obstructive sleep apnea) 02/23/2017  . Acute gouty arthritis 12/25/2016  . Stroke ([place])   . Chronic fatigue 11/09/2016  . Hypoxia 11/09/2016  . Morbidly obese ([place]) 11/09/2016  . Allergic rhinitis 07/16/2016  . Vitamin D deficiency 04/16/2016  . Encounter for chronic pain management 03/12/2016  . Hyperlipidemia 01/15/2016  . Gait disorder 12/04/2015  . Diabetes ([place]) 11/26/2015  . Chronic diastolic congestive heart failure ([place]) 11/06/2015  . TIA (transient ischemic attack)   . Carotid-cavernous fistula   . Chest pain 10/31/2015  . Insomnia 10/10/2015  . Depression 10/10/2015  . Benign essential HTN 08/15/2015  . Fibromyalgia 08/15/2015  . Peripheral neuropathy 08/15/2015  . CAD (coronary artery disease) 08/15/2015  . COPD (chronic obstructive pulmonary disease) ([place]) 08/15/2015  . Osteoporosis 08/15/2015  . 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 DDD (degenerative disc disease), lumbar 08/15/2015  . Occupational exposure to industrial toxins 08/15/2015  . GERD (gastroesophageal reflux disease) 08/15/2015    Current Outpatient Medications on File Prior to Visit  Medication Sig Dispense Refill  . AMITIZA 8 MCG capsule Take 1 capsule by mouth 2 (two) times daily.  0  . amitriptyline (ELAVIL) 50 MG tablet Take 1 tablet (50 mg total) by mouth at bedtime. 90 tablet 1  . aspirin EC 81 MG tablet Take 1 tablet (81 mg total) by mouth daily.    Marland Kitchen atorvastatin (LIPITOR) 20 MG tablet TAKE 1  TABLET(20 MG) BY MOUTH DAILY 90 tablet 3  . budesonide-formoterol (SYMBICORT) 80-4.5 MCG/ACT inhaler Inhale 2 puffs into the lungs 2 (two) times daily. 1 Inhaler 3  . cephALEXin (KEFLEX) 500 MG capsule Take 1 capsule (500 mg total) by mouth 3 (three) times daily. 30 capsule 0  . cloNIDine (CATAPRES) 0.3 MG tablet Take 1 tablet (0.3 mg total) by mouth daily. 90 tablet 3  . clotrimazole (LOTRIMIN) 1 % cream Apply to affected area 2 times daily 14 g 0  . COMBIVENT RESPIMAT 20-100 MCG/ACT AERS respimat Inhale 1 puff by mouth every 6 hours 4 g 11  . [START ON 11/07/2017] Dulaglutide (TRULICITY) 1.5 KJ/1.7HX SOPN Inject 1.5 mg into the skin every Sunday. 14 pen 0  . erythromycin (ROMYCIN) ophthalmic ointment Place 1 application into the right eye at bedtime. 3.5 g 0  . escitalopram (LEXAPRO) 10 MG tablet Take 1 tablet (10 mg total) by mouth daily. 90 tablet 3  . fluticasone (FLONASE) 50 MCG/ACT nasal spray SHAKE LIQUID AND INSTILL 1 SPRAY INTO EACH NOSTRIL ONCE DAILY AS NEEDED FOR ALLERGIES 16 g 11  . furosemide (LASIX) 80 MG tablet Take 1 tablet (80 mg total) by mouth daily. 90 tablet 3  . gabapentin (NEURONTIN) 600 MG tablet Take 2 tablets by mouth 3 times a day 180 tablet 2  . glucose blood test strip Use as instructed to check sugar three times a day 200 each 5  . HYDROcodone-acetaminophen (NORCO/VICODIN) 5-325 MG tablet Take 1 tablet by mouth every  4 (four) hours as needed. 15 tablet 0  . hydrocortisone (ANUSOL-HC) 2.5 % rectal cream Apply rectally 2 times daily 28.35 g 0  . Lancets MISC Use as directed twice per day 200 each 5  . levocetirizine (XYZAL) 5 MG tablet TAKE 1 TABLET(5 MG) BY MOUTH EVERY EVENING 90 tablet 0  . lidocaine (XYLOCAINE) 5 % ointment Apply 1 application topically as needed. 106.32 g 0  . losartan (COZAAR) 50 MG tablet Take 1 tablet (50 mg total) by mouth 2 (two) times daily. 180 tablet 3  . metFORMIN (GLUCOPHAGE-XR) 500 MG 24 hr tablet TAKE 2 TABLETS BY MOUTH 2  TIMES DAILY  WITH A MEAL. 360 tablet 1  . methocarbamol (ROBAXIN) 500 MG tablet Take 1 tablet (500 mg total) by mouth every 6 (six) hours as needed for muscle spasms. 60 tablet 0  . metoprolol (TOPROL-XL) 200 MG 24 hr tablet Take 1 tablet (200 mg total) by mouth 2 (two) times daily. 180 tablet 3  . mometasone (NASONEX) 50 MCG/ACT nasal spray Place 2 sprays into the nose daily. 51 g 1  . mupirocin ointment (BACTROBAN) 2 % Apply 1 application topically 2 (two) times daily. 30 g 0  . nitroGLYCERIN (NITROSTAT) 0.4 MG SL tablet Place 0.4 mg under the tongue every 5 (five) minutes as needed for chest pain.    . Omega 3 1200 MG CAPS Take 1 capsule by mouth daily 90 capsule 1  . omeprazole (PRILOSEC) 40 MG capsule Take 1 capsule (40 mg total) by mouth daily. 90 capsule 3  . oxyCODONE (OXYCONTIN) 15 mg 12 hr tablet Take 15 mg by mouth every 6 (six) hours.    . potassium chloride SA (K-DUR,KLOR-CON) 20 MEQ tablet Take 1 tablet (20 mEq total) by mouth daily. 90 tablet 3  . rivaroxaban (XARELTO) 10 MG TABS tablet Take 1 tablet (10 mg total) by mouth daily with breakfast. 15 tablet 0  . sulfamethoxazole-trimethoprim (BACTRIM DS,SEPTRA DS) 800-160 MG tablet Take 1 tablet by mouth 2 (two) times daily. 20 tablet 0  . tiZANidine (ZANAFLEX) 4 MG tablet Take 2.5 tablets (10 mg total) by mouth 3 (three) times daily. 225 tablet 5  . traZODone (DESYREL) 50 MG tablet Take 1 tablet (50 mg total) by mouth at bedtime as needed for sleep. 90 tablet 0   No current facility-administered medications on file prior to visit.     Past Medical History:  Diagnosis Date  . Anxiety   . Arthritis   . CHF (congestive heart failure) ([place])   . COPD (chronic obstructive pulmonary disease) ([place])   . Coronary artery disease   . Depression   . Diabetes mellitus without complication ([place])    type 2   . GERD (gastroesophageal reflux disease)   . Hypertension   . Lupus ([place])   . Neuromuscular disorder ([place])   . Osteoporosis   . Oxygen  deficiency    pt uses 2.5L 02 at night   . Peripheral neuropathy   . Sleep apnea    had sleep study done recently ; unaware if she will be getting  a CPAP device ; patient states "im pretty sure i have it , i fall alseep all the time "  . Stroke [facility]) 10/2015    Past Surgical History:  Procedure Laterality Date  . ABDOMINAL HYSTERECTOMY     ovaries left  . APPENDECTOMY    . CHOLECYSTECTOMY    . IR GENERIC HISTORICAL  11/07/2015   IR ANGIO INTRA EXTRACRAN SEL COM  CAROTID INNOMINATE BILAT MOD SED 11/07/2015 Luanne Bras, MD [facility]  . IR GENERIC HISTORICAL  11/07/2015   IR ANGIO VERTEBRAL SEL SUBCLAVIAN INNOMINATE UNI R MOD SED 11/07/2015 Luanne Bras, MD [facility]  . IR GENERIC HISTORICAL  11/07/2015   IR ANGIO VERTEBRAL SEL VERTEBRAL UNI L MOD SED 11/07/2015 Luanne Bras, MD [facility]  . IR GENERIC HISTORICAL  11/07/2015   IR ANGIOGRAM EXTREMITY LEFT 11/07/2015 Luanne Bras, MD [facility]  . TONSILLECTOMY    . TOTAL KNEE ARTHROPLASTY Left 03/12/2017   Procedure: LEFT TOTAL KNEE ARTHROPLASTY;  Surgeon: Mcarthur Rossetti, MD;  Location: [facility];  Service: Orthopedics;  Laterality: Left;  Adductor Block    Social History   Socioeconomic History  . Marital status: Single    Spouse name: n/a  . Number of children: 3  . Years of education: 12+  . Highest education level: Not on file  Occupational History  . 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 Financial resource strain: Not on file  . Food insecurity:    Worry: Not on file    Inability: Not on file  . Transportation needs:    Medical: Not on file    Non-medical: Not on file  Tobacco Use  . Smoking status: Current Every Day Smoker    Packs/day: 0.25    Years: 35.00    Pack years: 8.75    Types: Cigarettes  . Smokeless tobacco: Never Used  . Tobacco comment: referred  to smoking  cessation   classes. at  [facility]   Substance and Sexual Activity  . Alcohol use: No    Alcohol/week: 1.2 - 1.8 oz    Types: 2 - 3 Standard drinks or equivalent per week  . 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   Marland Kitchen Sexual activity: Yes    Partners: Female  Lifestyle  . Physical activity:    Days per week: Not on file    Minutes per session: Not on file  . Stress: Not on file  Relationships  . Social connections:    Talks on phone: Not on file    Gets together: Not on file    Attends religious service: Not on file    Active member of club or organization: Not on file    Attends meetings of clubs or organizations: Not on file    Relationship status: Not on file  Other Topics Concern  . Not on file  Social History Narrative   Moved to [place] from [place], [place] February 2017, to help her daughter.   Lives with her daughter.   Sons live in [place] and [place].   She reports that there were originally 17 children in her family (she is the youngest), the oldest are deceased, some prior to her birth, and she isn't sure which were female/female or how they died.    Family History  Problem Relation Age of Onset  . Hyperlipidemia Mother   . Hypertension Mother   . Hyperlipidemia Father   . Hypertension Father   . Stroke Father   . Hypertension Sister   . Cancer Sister        breast cancer  . Crohn's disease Sister     Review of Systems     Objective:  There were no vitals filed for this visit. BP Readings from Last 3 Encounters:  09/28/17 (!) 146/95  09/21/17 110/76  08/05/17  120/82   Wt Readings from Last 3 Encounters:  09/21/17 294 lb (133.4 kg)  08/05/17 287 lb (130.2 kg)  04/29/17 (!) 305 lb (138.3 kg)   There is no height or weight on file to calculate BMI.   Physical Exam         Assessment & Plan:    See Problem List for Assessment and Plan of chronic medical problems.   This encounter was created in error - please disregard.

## 2017-11-08 ENCOUNTER — Encounter: Payer: Medicare Other | Admitting: Internal Medicine

## 2017-11-08 DIAGNOSIS — M545 Low back pain: Secondary | ICD-10-CM | POA: Diagnosis not present

## 2017-11-08 DIAGNOSIS — M25559 Pain in unspecified hip: Secondary | ICD-10-CM | POA: Diagnosis not present

## 2017-11-08 DIAGNOSIS — G894 Chronic pain syndrome: Secondary | ICD-10-CM | POA: Diagnosis not present

## 2017-11-08 DIAGNOSIS — Z79891 Long term (current) use of opiate analgesic: Secondary | ICD-10-CM | POA: Diagnosis not present

## 2017-11-08 DIAGNOSIS — Z0289 Encounter for other administrative examinations: Secondary | ICD-10-CM

## 2017-11-08 DIAGNOSIS — M25562 Pain in left knee: Secondary | ICD-10-CM | POA: Diagnosis not present

## 2017-11-08 NOTE — Progress Notes (Signed)
Subjective: Tammy Strickland is a 57 y.o. female returns to office today for follow up evaluation after having bilateral hallux and right 2nd and 3rd total nail avulsion performed. Patient has been soaking using epsom salts and applying topical antibiotic covered with bandaid daily. She states that the hallux toenails are the most tender, mostly the right hallux toenail. Denies any pus or drainage. Patient denies fevers, chills, nausea, vomiting. Denies any calf pain, chest pain, SOB.   Objective:  Vitals: Reviewed  General: Well developed, nourished, in no acute distress, alert and oriented x3   Dermatology: Skin is warm, dry and supple bilateral. Bilateral nail bedsappears to be clean, dry, with mild granular tissue to the hallux toenails but the right 2nd and 3rd appear to be healed. There is no surrounding erythema, edema, drainage/purulence. The remaining nails appear unremarkable at this time. There are no other lesions or other signs of infection present.        Neurovascular status: Intact. No lower extremity swelling; No pain with calf compression bilateral.  Musculoskeletal: Decreased tenderness to palpation of the nail folds. Muscular strength within normal limits bilateral.   Assesement and Plan: S/p partial nail avulsion, doing well.   -Continue soaking in epsom salts twice a day followed by antibiotic ointment and a band-aid. Can leave uncovered at night. Continue this until completely healed.  -Remain in surgical shoes but can transition to a regular shoe as able.  -Monitor for any signs/symptoms of infection. Call the office immediately if any occur or go directly to the emergency room. Call with any questions/concerns.  Return in about 2 weeks (around 11/19/2017) for nail check.  Celesta Gentile, DPM

## 2017-11-09 DIAGNOSIS — M791 Myalgia, unspecified site: Secondary | ICD-10-CM | POA: Diagnosis not present

## 2017-11-09 NOTE — Telephone Encounter (Signed)
Order faxed for O&O to [place] before being released from [facility].

## 2017-11-19 ENCOUNTER — Ambulatory Visit: Payer: Medicare Other | Admitting: Podiatry

## 2017-11-22 ENCOUNTER — Other Ambulatory Visit: Payer: Self-pay | Admitting: Internal Medicine

## 2017-11-29 ENCOUNTER — Encounter: Payer: Self-pay | Admitting: Podiatry

## 2017-11-29 ENCOUNTER — Ambulatory Visit (INDEPENDENT_AMBULATORY_CARE_PROVIDER_SITE_OTHER): Payer: Medicare Other | Admitting: Podiatry

## 2017-11-29 DIAGNOSIS — E1149 Type 2 diabetes mellitus with other diabetic neurological complication: Secondary | ICD-10-CM | POA: Diagnosis not present

## 2017-11-29 DIAGNOSIS — L603 Nail dystrophy: Secondary | ICD-10-CM

## 2017-11-29 MED ORDER — CEPHALEXIN 500 MG PO CAPS: 500.0000 mg | ORAL_CAPSULE | Freq: Three times a day (TID) | ORAL | 0 refills | Status: DC

## 2017-11-29 MED ORDER — MUPIROCIN 2 % EX OINT: 1.0000 "application " | TOPICAL_OINTMENT | Freq: Two times a day (BID) | CUTANEOUS | 2 refills | Status: DC

## 2017-11-29 NOTE — Patient Instructions (Signed)

## 2017-11-30 NOTE — Progress Notes (Signed)
Subjective: 57 year old female presents the office today for follow-up evaluation status post bilateral hallux as well as right second third digit toenail removals with chemical matricectomy.  She said the right second third toes are doing well but she still gets some pain at the base of the toenails and the big nails.  She is noticed some clear drainage coming from the area but denies any increase in swelling or redness.  The area still tender but getting somewhat better.  She is wearing closed in shoe today.  She still soaking Epsom salts given cover with antibiotic ointment and a bandage.  She denies any fevers, chills, nausea, vomiting.  No calf pain, chest pain, shortness of breath.  Objective: AAO x3, NAD DP/PT pulses palpable bilaterally, CRT less than 3 seconds The right second third nailbeds appear to be healed.  Small amount of fibrotic tissue present on the base of bilateral hallux nails with the left side worse than the right.  Small amount of clear drainage expressed but there is no purulence.  Minimal swelling to the proximal nail corner but there is no erythema or increase in warmth.  There is no ascending cellulitis.  There is no fluctuation or crepitation or any malodor.  No open lesions or pre-ulcerative lesions.  No pain with calf compression, swelling, warmth, erythema  Assessment: 57 year old female with healing procedure sites  Plan: -All treatment options discussed with the patient including all alternatives, risks, complications.  -Area appears to be improved compared to what it has been but there is been some clear drainage or and start antibiotics.  Prescribed Keflex.  Continue antibiotic, dressing changes daily as well as Epsom salts soaks. -Monitor for any clinical signs or symptoms of infection and directed to call the office immediately should any occur or go to the ER. -Patient encouraged to call the office with any questions, concerns, change in symptoms.   Return in  about 2 weeks (around 12/13/2017).  Trula Slade DPM

## 2017-12-06 DIAGNOSIS — M25559 Pain in unspecified hip: Secondary | ICD-10-CM | POA: Diagnosis not present

## 2017-12-06 DIAGNOSIS — M25562 Pain in left knee: Secondary | ICD-10-CM | POA: Diagnosis not present

## 2017-12-06 DIAGNOSIS — Z79891 Long term (current) use of opiate analgesic: Secondary | ICD-10-CM | POA: Diagnosis not present

## 2017-12-06 DIAGNOSIS — M545 Low back pain: Secondary | ICD-10-CM | POA: Diagnosis not present

## 2017-12-06 DIAGNOSIS — G894 Chronic pain syndrome: Secondary | ICD-10-CM | POA: Diagnosis not present

## 2017-12-10 DIAGNOSIS — M791 Myalgia, unspecified site: Secondary | ICD-10-CM | POA: Diagnosis not present

## 2017-12-14 ENCOUNTER — Ambulatory Visit: Payer: Medicare Other | Admitting: Podiatry

## 2017-12-14 ENCOUNTER — Other Ambulatory Visit: Payer: Medicare Other | Admitting: Orthotics

## 2017-12-21 ENCOUNTER — Ambulatory Visit (INDEPENDENT_AMBULATORY_CARE_PROVIDER_SITE_OTHER): Payer: Medicare Other | Admitting: Podiatry

## 2017-12-21 DIAGNOSIS — E1149 Type 2 diabetes mellitus with other diabetic neurological complication: Secondary | ICD-10-CM

## 2017-12-21 DIAGNOSIS — L603 Nail dystrophy: Secondary | ICD-10-CM

## 2017-12-22 NOTE — Progress Notes (Signed)
Subjective: 57 year old female presents the office today for follow-up evaluation status post bilateral hallux as well as right second third digit toenail removals with chemical matricectomy.  She states that all the toes.  Except for the left big toe still had a small wound to the area but she keeps a bandage on during the day but does not apply any ointment.  She states the tenderness is getting better but still tenderness in the left big toe over the area of the wound.  She denies any drainage or pus coming from the area she has no other concerns today to her lower extremities. She denies any fevers, chills, nausea, vomiting.  No calf pain, chest pain, shortness of breath.  Objective: AAO x3, NAD DP/PT pulses palpable bilaterally, CRT less than 3 seconds The right second third nailbeds appear to be healed.  Today the right hallux toenail appears to be healed.  Small superficial granular wound present to the central aspect of the left hallux however there is no probing, undermining or tunneling there is no drainage or pus identified today there is no edema, erythema.  Minimal tenderness palpation overall the tenderness is improved.  She states that she has difficulty putting pressure to the area when she lays on her stomach but otherwise is improving. No pain with calf compression, swelling, warmth, erythema        Assessment: 57 year old female with healing procedure sites  Plan: -All treatment options discussed with the patient including all alternatives, risks, complications.  -I debrided some loose hyperkeratotic tissue today.  Continue antibiotic ointment dressing changes to left hallux toenail daily.  To the area uncovered at night.  Monitoring signs or symptoms of infection.  Return in about 3 weeks (around 01/11/2018).  Trula Slade DPM

## 2017-12-27 ENCOUNTER — Other Ambulatory Visit: Payer: Self-pay | Admitting: Internal Medicine

## 2017-12-29 ENCOUNTER — Encounter (INDEPENDENT_AMBULATORY_CARE_PROVIDER_SITE_OTHER): Payer: Self-pay | Admitting: Orthopaedic Surgery

## 2017-12-29 ENCOUNTER — Ambulatory Visit (INDEPENDENT_AMBULATORY_CARE_PROVIDER_SITE_OTHER): Payer: Medicare Other | Admitting: Orthopaedic Surgery

## 2017-12-29 DIAGNOSIS — M7062 Trochanteric bursitis, left hip: Secondary | ICD-10-CM | POA: Diagnosis not present

## 2017-12-29 DIAGNOSIS — M7632 Iliotibial band syndrome, left leg: Secondary | ICD-10-CM | POA: Diagnosis not present

## 2017-12-29 MED ORDER — LIDOCAINE HCL 1 % IJ SOLN
3.0000 mL | INTRAMUSCULAR | Status: AC | PRN
  Administered 2017-12-29: 3 mL

## 2017-12-29 MED ORDER — METHYLPREDNISOLONE ACETATE 40 MG/ML IJ SUSP
40.0000 mg | INTRAMUSCULAR | Status: AC | PRN
  Administered 2017-12-29: 40 mg via INTRA_ARTICULAR

## 2017-12-29 NOTE — Progress Notes (Signed)
Office Visit Note   Patient: Tammy Strickland           Date of Birth: [birth year]           MRN: [id number] Visit Date: 12/29/2017              Requested by: Binnie Rail, MD [place], [place] [address] PCP: Binnie Rail, MD   Assessment & Plan: Visit Diagnoses:  1. Trochanteric bursitis, left hip   2. It band syndrome, left     Plan: I am still uncertain as to why she states that she has been denied physical therapy due to her insurance because she seems to have good insurance.  She still needs to take control of her own health in terms of stopping smoking and losing weight.  I did provide a steroid injection around the trochanteric area of her hip.  We need to set her up for outpatient physical therapy.  We will see her back in 3 months to see how she is doing overall.  She still having severe left hip pain we would obtain an AP pelvis and lateral of the left hip.  Follow-Up Instructions: No follow-ups on file.   Orders:  No orders of the defined types were placed in this encounter.  No orders of the defined types were placed in this encounter.     Procedures: Large Joint Inj: L greater trochanter on 12/29/2017 9:58 AM Indications: pain and diagnostic evaluation Details: 22 G 1.5 in needle, lateral approach  Arthrogram: No  Medications: 3 mL lidocaine 1 %; 40 mg methylPREDNISolone acetate 40 MG/ML Outcome: tolerated well, no immediate complications Procedure, treatment alternatives, risks and benefits explained, specific risks discussed. Consent was given by the patient. Immediately prior to procedure a time out was called to verify the correct patient, procedure, equipment, support staff and site/side marked as required. Patient was prepped and draped in the usual sterile fashion.       Clinical Data: No additional findings.   Subjective: Chief Complaint  Patient presents with  . Left Knee - Pain  The patient comes in today with worsening left hip  and knee pain.  She is had a left total knee arthroplasty and she is morbidly obese diabetic but her pain is been consistent with trochanteric bursitis and IT band syndrome.  I tried to send her physical therapy before but she says physical therapy does not take her insurance however she has [place] [employer], product/process development.  Her pain is still consistent with trochanteric bursitis and IT band syndrome.  HPI  Review of Systems She currently denies any headache, chest pain, shortness of breath, fever, chills, nausea, vomiting.  Objective: Vital Signs: There were no vitals taken for this visit.  Physical Exam She is alert and oriented x3 in no acute distress.  She is ambulate with a cane.  She is morbidly obese. Ortho Exam On examination she has severe pain of the trochanteric area of her hip and the IT band radiating to the knee. Specialty Comments:  No specialty comments available.  Imaging: No results found.   PMFS History: Patient Active Problem List   Diagnosis Date Noted  . It band syndrome, left 12/29/2017  . Trochanteric bursitis, left hip 12/29/2017  . Stye external 09/21/2017  . Cough 08/05/2017  . Dysphagia 04/29/2017  . History of left knee replacement 04/21/2017  . Pain in left ankle and joints of left foot 04/21/2017  . Pain in left hand 04/21/2017  .  Status post total left knee replacement 03/12/2017  . OSA (obstructive sleep apnea) 02/23/2017  . Acute gouty arthritis 12/25/2016  . Stroke ([place])   . Chronic fatigue 11/09/2016  . Hypoxia 11/09/2016  . Morbidly obese ([place]) 11/09/2016  . Allergic rhinitis 07/16/2016  . Vitamin D deficiency 04/16/2016  . Encounter for chronic pain management 03/12/2016  . Hyperlipidemia 01/15/2016  . Gait disorder 12/04/2015  . Diabetes ([place]) 11/26/2015  . Chronic diastolic congestive heart failure ([place]) 11/06/2015  . TIA (transient ischemic attack)   . Carotid-cavernous fistula   . Chest pain 10/31/2015  . Insomnia 10/10/2015  .  Depression 10/10/2015  . Benign essential HTN 08/15/2015  . Fibromyalgia 08/15/2015  . Peripheral neuropathy 08/15/2015  . CAD (coronary artery disease) 08/15/2015  . COPD (chronic obstructive pulmonary disease) ([place]) 08/15/2015  . Osteoporosis 08/15/2015  . 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 DDD (degenerative disc disease), lumbar 08/15/2015  . Occupational exposure to industrial toxins 08/15/2015  . GERD (gastroesophageal reflux disease) 08/15/2015   Past Medical History:  Diagnosis Date  . Anxiety   . Arthritis   . CHF (congestive heart failure) ([place])   . COPD (chronic obstructive pulmonary disease) ([place])   . Coronary artery disease   . Depression   . Diabetes mellitus without complication ([place])    type 2   . GERD (gastroesophageal reflux disease)   . Hypertension   . Lupus ([place])   . Neuromuscular disorder ([place])   . Osteoporosis   . Oxygen deficiency    pt uses 2.5L 02 at night   . Peripheral neuropathy   . Sleep apnea    had sleep study done recently ; unaware if she will be getting  a CPAP device ; patient states "im pretty sure i have it , i fall alseep all the time "  . Stroke [facility]) 10/2015    Family History  Problem Relation Age of Onset  . Hyperlipidemia Mother   . Hypertension Mother   . Hyperlipidemia Father   . Hypertension Father   . Stroke Father   . Hypertension Sister   . Cancer Sister        breast cancer  . Crohn's disease Sister     Past Surgical History:  Procedure Laterality Date  . ABDOMINAL HYSTERECTOMY     ovaries left  . APPENDECTOMY    . CHOLECYSTECTOMY    . IR GENERIC HISTORICAL  11/07/2015   IR ANGIO INTRA EXTRACRAN SEL COM CAROTID INNOMINATE BILAT MOD SED 11/07/2015 Luanne Bras, MD [facility]  . IR GENERIC HISTORICAL  11/07/2015   IR ANGIO VERTEBRAL SEL SUBCLAVIAN INNOMINATE UNI R MOD SED 11/07/2015 Luanne Bras, MD [facility]  . IR GENERIC HISTORICAL  11/07/2015   IR ANGIO VERTEBRAL SEL VERTEBRAL UNI L MOD SED 11/07/2015  Luanne Bras, MD [facility]  . IR GENERIC HISTORICAL  11/07/2015   IR ANGIOGRAM EXTREMITY LEFT 11/07/2015 Luanne Bras, MD [facility]  . TONSILLECTOMY    . TOTAL KNEE ARTHROPLASTY Left 03/12/2017   Procedure: LEFT TOTAL KNEE ARTHROPLASTY;  Surgeon: Mcarthur Rossetti, MD;  Location: [facility];  Service: Orthopedics;  Laterality: Left;  Adductor Block   Social History   Occupational History  . 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 Smoking status: Current Every Day Smoker    Packs/day: 0.25    Years: 35.00    Pack years: 8.75    Types: Cigarettes  .  Smokeless tobacco: Never Used  . Tobacco comment: referred  to smoking  cessation  classes. at  [facility]   Substance and Sexual Activity  . Alcohol use: No    Alcohol/week: 2.0 - 3.0 standard drinks    Types: 2 - 3 Standard drinks or equivalent per week  . 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   Marland Kitchen Sexual activity: Yes    Partners: Female

## 2017-12-30 ENCOUNTER — Other Ambulatory Visit (INDEPENDENT_AMBULATORY_CARE_PROVIDER_SITE_OTHER): Payer: Self-pay

## 2017-12-30 DIAGNOSIS — M7062 Trochanteric bursitis, left hip: Secondary | ICD-10-CM

## 2018-01-03 ENCOUNTER — Other Ambulatory Visit: Payer: Self-pay | Admitting: Internal Medicine

## 2018-01-03 DIAGNOSIS — M545 Low back pain: Secondary | ICD-10-CM | POA: Diagnosis not present

## 2018-01-03 DIAGNOSIS — M25562 Pain in left knee: Secondary | ICD-10-CM | POA: Diagnosis not present

## 2018-01-03 DIAGNOSIS — G894 Chronic pain syndrome: Secondary | ICD-10-CM | POA: Diagnosis not present

## 2018-01-03 DIAGNOSIS — M25559 Pain in unspecified hip: Secondary | ICD-10-CM | POA: Diagnosis not present

## 2018-01-03 DIAGNOSIS — Z79891 Long term (current) use of opiate analgesic: Secondary | ICD-10-CM | POA: Diagnosis not present

## 2018-01-04 ENCOUNTER — Other Ambulatory Visit: Payer: Self-pay | Admitting: Internal Medicine

## 2018-01-04 MED ORDER — MOMETASONE FUROATE 50 MCG/ACT NA SUSP: 2.0000 | Freq: Every day | NASAL | 1 refills | Status: DC

## 2018-01-04 NOTE — Telephone Encounter (Signed)
mometasone refill Last Refill:10/13/17 # 51 g 1 RF Last OV: cannot locate OV note that addresses medication PCP: Dr Quay Burow Pharmacy:[facility] [address]

## 2018-01-04 NOTE — Telephone Encounter (Signed)
Copied from [place] [id number]. Topic: Quick Communication - Rx Refill/Question >> Jan 04, 2018 10:39 AM Celedonio Savage L wrote: Medication: mometasone (NASONEX) 50 MCG/ACT nasal spray pharmacy states that the medicine has expired   Has the patient contacted their pharmacy? Yes. Pharmacy called  (Agent: If no, request that the patient contact the pharmacy for the refill.) (Agent: If yes, when and what did the pharmacy advise?)  Preferred Pharmacy (with phone number or street name): [place], [place] [phone] (Phone) [phone] (Fax)    Agent: Please be advised that RX refills may take up to 3 business days. We ask that you follow-up with your pharmacy.

## 2018-01-10 DIAGNOSIS — M791 Myalgia, unspecified site: Secondary | ICD-10-CM | POA: Diagnosis not present

## 2018-01-11 ENCOUNTER — Ambulatory Visit: Payer: Medicare Other | Admitting: Podiatry

## 2018-01-17 NOTE — Progress Notes (Deleted)
Subjective:    Patient ID: Tammy Strickland, female    DOB: [birth year], 57 y.o.   MRN: [id number]  HPI The patient is here for follow up.  Ear infection:    Diabetes: She is taking her medication daily as prescribed. She is compliant with a diabetic diet. She is exercising regularly. She monitors her sugars and they have been running XXX. She checks her feet daily and denies foot lesions. She is up-to-date with an ophthalmology examination.   Hypertension: She is taking her medication daily. She is compliant with a low sodium diet.  She denies chest pain, palpitations, edema, shortness of breath and regular headaches. She is exercising regularly.  She does not monitor her blood pressure at home.    Hyperlipidemia: She is taking her medication daily. She is compliant with a low fat/cholesterol diet. She is exercising regularly. She denies myalgias.   Insomnia:  She is taking trazodone nightly.   Depression: She is taking her medication daily as prescribed. She denies any side effects from the medication. She feels her depression is well controlled and she is happy with her current dose of medication.   Morbid obesity:    Medications and allergies reviewed with patient and updated if appropriate.  Patient Active Problem List   Diagnosis Date Noted  . It band syndrome, left 12/29/2017  . Trochanteric bursitis, left hip 12/29/2017  . Stye external 09/21/2017  . Cough 08/05/2017  . Dysphagia 04/29/2017  . History of left knee replacement 04/21/2017  . Pain in left ankle and joints of left foot 04/21/2017  . Pain in left hand 04/21/2017  . Status post total left knee replacement 03/12/2017  . OSA (obstructive sleep apnea) 02/23/2017  . Acute gouty arthritis 12/25/2016  . Stroke ([place])   . Chronic fatigue 11/09/2016  . Hypoxia 11/09/2016  . Morbidly obese ([place]) 11/09/2016  . Allergic rhinitis 07/16/2016  . Vitamin D deficiency 04/16/2016  . Encounter for chronic pain management  03/12/2016  . Hyperlipidemia 01/15/2016  . Gait disorder 12/04/2015  . Diabetes ([place]) 11/26/2015  . Chronic diastolic congestive heart failure ([place]) 11/06/2015  . TIA (transient ischemic attack)   . Carotid-cavernous fistula   . Chest pain 10/31/2015  . Insomnia 10/10/2015  . Depression 10/10/2015  . Benign essential HTN 08/15/2015  . Fibromyalgia 08/15/2015  . Peripheral neuropathy 08/15/2015  . CAD (coronary artery disease) 08/15/2015  . COPD (chronic obstructive pulmonary disease) ([place]) 08/15/2015  . Osteoporosis 08/15/2015  . 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 DDD (degenerative disc disease), lumbar 08/15/2015  . Occupational exposure to industrial toxins 08/15/2015  . GERD (gastroesophageal reflux disease) 08/15/2015    Current Outpatient Medications on File Prior to Visit  Medication Sig Dispense Refill  . AMITIZA 8 MCG capsule Take 1 capsule by mouth 2 (two) times daily.  0  . amitriptyline (ELAVIL) 50 MG tablet Take 1 tablet by mouth at bedtime 30 tablet 3  . aspirin EC 81 MG tablet Take 1 tablet (81 mg total) by mouth daily.    Marland Kitchen atorvastatin (LIPITOR) 20 MG tablet TAKE 1 TABLET(20 MG) BY MOUTH DAILY 90 tablet 3  . cephALEXin (KEFLEX) 500 MG capsule Take 1 capsule (500 mg total) by mouth 3 (three) times daily. 30 capsule 0  . cephALEXin (KEFLEX) 500 MG capsule Take 1 capsule (500 mg total) by mouth 3 (three) times daily. 30 capsule 0  . cloNIDine (CATAPRES) 0.3 MG tablet Take 1 tablet (0.3 mg total) by mouth daily. 90 tablet 3  .  clotrimazole (LOTRIMIN) 1 % cream Apply to affected area 2 times daily 14 g 0  . COMBIVENT RESPIMAT 20-100 MCG/ACT AERS respimat Inhale 1 puff by mouth every 6 hours 4 g 11  . erythromycin [facility]) ophthalmic ointment Place 1 application into the right eye at bedtime. 3.5 g 0  . escitalopram (LEXAPRO) 10 MG tablet Take 1 tablet (10 mg total) by mouth daily. 90 tablet 3  . fluticasone (FLONASE) 50 MCG/ACT nasal spray SHAKE LIQUID AND INSTILL 1 SPRAY INTO  EACH NOSTRIL ONCE DAILY AS NEEDED FOR ALLERGIES 16 g 11  . furosemide (LASIX) 80 MG tablet Take 1 tablet (80 mg total) by mouth daily. 90 tablet 3  . gabapentin (NEURONTIN) 600 MG tablet Take 2 tablets by mouth 3 times a day 180 tablet 2  . glucose blood test strip Use as instructed to check sugar three times a day 200 each 5  . HYDROcodone-acetaminophen (NORCO/VICODIN) 5-325 MG tablet Take 1 tablet by mouth every 4 (four) hours as needed. 15 tablet 0  . hydrocortisone (ANUSOL-HC) 2.5 % rectal cream Apply rectally 2 times daily 28.35 g 0  . Lancets MISC Use as directed twice per day 200 each 5  . levocetirizine (XYZAL) 5 MG tablet TAKE 1 TABLET(5 MG) BY MOUTH EVERY EVENING 90 tablet 0  . lidocaine (XYLOCAINE) 5 % ointment Apply 1 application topically as needed. 106.32 g 0  . losartan (COZAAR) 50 MG tablet Take 1 tablet (50 mg total) by mouth 2 (two) times daily. 180 tablet 3  . metFORMIN (GLUCOPHAGE-XR) 500 MG 24 hr tablet TAKE TWO TABLETS BY MOUTH TWICE DAILY WITH A MEAL 360 tablet 0  . methocarbamol (ROBAXIN) 500 MG tablet Take 1 tablet (500 mg total) by mouth every 6 (six) hours as needed for muscle spasms. 60 tablet 0  . metoprolol (TOPROL-XL) 200 MG 24 hr tablet Take 1 tablet (200 mg total) by mouth 2 (two) times daily. 180 tablet 3  . mometasone (NASONEX) 50 MCG/ACT nasal spray Place 2 sprays into the nose daily. 51 g 1  . mupirocin ointment (BACTROBAN) 2 % Apply 1 application topically 2 (two) times daily. 30 g 0  . mupirocin ointment (BACTROBAN) 2 % Apply 1 application topically 2 (two) times daily. 30 g 2  . nitroGLYCERIN (NITROSTAT) 0.4 MG SL tablet Place 0.4 mg under the tongue every 5 (five) minutes as needed for chest pain.    . Omega 3 1200 MG CAPS Take 1 capsule by mouth daily 90 capsule 1  . omeprazole (PRILOSEC) 40 MG capsule Take 1 capsule (40 mg total) by mouth daily. 90 capsule 3  . oxyCODONE (OXYCONTIN) 15 mg 12 hr tablet Take 15 mg by mouth every 6 (six) hours.    .  potassium chloride SA (K-DUR,KLOR-CON) 20 MEQ tablet Take 1 tablet (20 mEq total) by mouth daily. 90 tablet 3  . rivaroxaban (XARELTO) 10 MG TABS tablet Take 1 tablet (10 mg total) by mouth daily with breakfast. 15 tablet 0  . sulfamethoxazole-trimethoprim (BACTRIM DS,SEPTRA DS) 800-160 MG tablet Take 1 tablet by mouth 2 (two) times daily. 20 tablet 0  . SYMBICORT 80-4.5 MCG/ACT inhaler Inhale 2 Puffs by mouth twice daily 1 Inhaler 3  . tiZANidine (ZANAFLEX) 4 MG tablet Take 2.5 tablets (10 mg total) by mouth 3 (three) times daily. 225 tablet 5  . traZODone (DESYREL) 50 MG tablet Take 1 tablet by mouth at bedtime as needed for sleep 90 tablet 0  . TRULICITY 1.5 YQ/6.5HQ SOPN Inject 1.5mg   subcutaneously every Sunday 4 pen 5   No current facility-administered medications on file prior to visit.     Past Medical History:  Diagnosis Date  . Anxiety   . Arthritis   . CHF (congestive heart failure) ([place])   . COPD (chronic obstructive pulmonary disease) ([place])   . Coronary artery disease   . Depression   . Diabetes mellitus without complication ([place])    type 2   . GERD (gastroesophageal reflux disease)   . Hypertension   . Lupus ([place])   . Neuromuscular disorder ([place])   . Osteoporosis   . Oxygen deficiency    pt uses 2.5L 02 at night   . Peripheral neuropathy   . Sleep apnea    had sleep study done recently ; unaware if she will be getting  a CPAP device ; patient states "im pretty sure i have it , i fall alseep all the time "  . Stroke [facility]) 10/2015    Past Surgical History:  Procedure Laterality Date  . ABDOMINAL HYSTERECTOMY     ovaries left  . APPENDECTOMY    . CHOLECYSTECTOMY    . IR GENERIC HISTORICAL  11/07/2015   IR ANGIO INTRA EXTRACRAN SEL COM CAROTID INNOMINATE BILAT MOD SED 11/07/2015 Luanne Bras, MD [facility]  . IR GENERIC HISTORICAL  11/07/2015   IR ANGIO VERTEBRAL SEL SUBCLAVIAN INNOMINATE UNI R MOD SED 11/07/2015 Luanne Bras, MD [facility]  . IR  GENERIC HISTORICAL  11/07/2015   IR ANGIO VERTEBRAL SEL VERTEBRAL UNI L MOD SED 11/07/2015 Luanne Bras, MD [facility]  . IR GENERIC HISTORICAL  11/07/2015   IR ANGIOGRAM EXTREMITY LEFT 11/07/2015 Luanne Bras, MD [facility]  . TONSILLECTOMY    . TOTAL KNEE ARTHROPLASTY Left 03/12/2017   Procedure: LEFT TOTAL KNEE ARTHROPLASTY;  Surgeon: Mcarthur Rossetti, MD;  Location: [facility];  Service: Orthopedics;  Laterality: Left;  Adductor Block    Social History   Socioeconomic History  . Marital status: Single    Spouse name: n/a  . Number of children: 3  . Years of education: 12+  . Highest education level: Not on file  Occupational History  . 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 Financial resource strain: Not on file  . Food insecurity:    Worry: Not on file    Inability: Not on file  . Transportation needs:    Medical: Not on file    Non-medical: Not on file  Tobacco Use  . Smoking status: Current Every Day Smoker    Packs/day: 0.25    Years: 35.00    Pack years: 8.75    Types: Cigarettes  . Smokeless tobacco: Never Used  . Tobacco comment: referred  to smoking  cessation  classes. at  [facility]   Substance and Sexual Activity  . Alcohol use: No    Alcohol/week: 2.0 - 3.0 standard drinks    Types: 2 - 3 Standard drinks or equivalent per week  . 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   Marland Kitchen Sexual activity: Yes    Partners: Female  Lifestyle  . Physical activity:    Days per week: Not on file    Minutes per session: Not on file  . Stress: Not on file  Relationships  . Social connections:    Talks on phone: Not on file    Gets together: Not on file  Attends religious service: Not on file    Active member of club or organization: Not on file    Attends meetings of clubs or organizations: Not on file    Relationship status: Not on file  Other Topics Concern    . Not on file  Social History Narrative   Moved to [place] from [place], [place] February 2017, to help her daughter.   Lives with her daughter.   Sons live in [place] and [place].   She reports that there were originally 17 children in her family (she is the youngest), the oldest are deceased, some prior to her birth, and she isn't sure which were female/female or how they died.    Family History  Problem Relation Age of Onset  . Hyperlipidemia Mother   . Hypertension Mother   . Hyperlipidemia Father   . Hypertension Father   . Stroke Father   . Hypertension Sister   . Cancer Sister        breast cancer  . Crohn's disease Sister     Review of Systems     Objective:  There were no vitals filed for this visit. BP Readings from Last 3 Encounters:  09/28/17 (!) 146/95  09/21/17 110/76  08/05/17 120/82   Wt Readings from Last 3 Encounters:  09/21/17 294 lb (133.4 kg)  08/05/17 287 lb (130.2 kg)  04/29/17 (!) 305 lb (138.3 kg)   There is no height or weight on file to calculate BMI.   Physical Exam    Constitutional: Appears well-developed and well-nourished. No distress.  HENT:  Head: Normocephalic and atraumatic.  Neck: Neck supple. No tracheal deviation present. No thyromegaly present.  No cervical lymphadenopathy Cardiovascular: Normal rate, regular rhythm and normal heart sounds.   No murmur heard. No carotid bruit .  No edema Pulmonary/Chest: Effort normal and breath sounds normal. No respiratory distress. No has no wheezes. No rales.  Skin: Skin is warm and dry. Not diaphoretic.  Psychiatric: Normal mood and affect. Behavior is normal.      Assessment & Plan:    See Problem List for Assessment and Plan of chronic medical problems.

## 2018-01-18 ENCOUNTER — Ambulatory Visit: Payer: Self-pay | Admitting: Internal Medicine

## 2018-01-23 NOTE — Progress Notes (Signed)
Subjective:    Patient ID: Tammy Strickland, female    DOB: 03/13/1961, 57 y.o.   MRN: [id number]  HPI The patient is here for follow up.  Left ear pain:  She has pain and a "breaking out" behind her left ear. She denies pain inside the ear.  She states the skin is dry and she has little bumps becoming her left ear.  At one point she did apply antibacterial ointment that contained bacitracin, which she is allergic to.  She wonders if it is an allergic reaction, but she still has dry skin before hand.  It is very itchy.  Tongue soreness and throat soreness - it started 2-3 weeks ago.  She saw her grandkids three weeks ago and they all had strep throat.   She does have difficulty swallowing at times.  She denies any congestion or fever.  She is using her Symbicort inhaler on a regular basis and states she is rinsing her mouth with a dry mouth rinse.  Headaches:  She has had a lot of headaches.  It only hurts on the left side of her head. It is constant.  It is a throbbing pain - it lasts for one minute - hours and then goes away.  She has taken aleve - 3-4 and that helps, but it comes back.   She does have pain in her jaw when she opens her mouth.  Diabetes: She is taking her medication daily as prescribed. She is compliant with a diabetic diet. She is not exercising regularly. She monitors her sugars and they have been running 90- low 100's.   Hypertension: She is taking her medication daily. She is compliant with a low sodium diet.  She denies chest pain, palpitations, edema. She is not exercising regularly.     Hyperlipidemia: She is taking her medication daily. She is compliant with a low fat/cholesterol diet. She is not exercising regularly. She denies myalgias.   Insomnia:  She is taking trazodone nightly.   Depression: She is taking her medication daily as prescribed. She denies any side effects from the medication. She feels her depression is well controlled and she is happy with her  current dose of medication.   Morbid obesity: She knows she needs to lose weight and wants to try to lose weight by the end of the year.  She is not currently exercising regularly.  She admits she could be eating better.  Medications and allergies reviewed with patient and updated if appropriate.  Patient Active Problem List   Diagnosis Date Noted  . It band syndrome, left 12/29/2017  . Trochanteric bursitis, left hip 12/29/2017  . Stye external 09/21/2017  . Cough 08/05/2017  . Dysphagia 04/29/2017  . History of left knee replacement 04/21/2017  . Pain in left ankle and joints of left foot 04/21/2017  . Pain in left hand 04/21/2017  . Status post total left knee replacement 03/12/2017  . OSA (obstructive sleep apnea) 02/23/2017  . Acute gouty arthritis 12/25/2016  . Stroke ([place])   . Chronic fatigue 11/09/2016  . Hypoxia 11/09/2016  . Morbidly obese ([place]) 11/09/2016  . Allergic rhinitis 07/16/2016  . Vitamin D deficiency 04/16/2016  . Encounter for chronic pain management 03/12/2016  . Hyperlipidemia 01/15/2016  . Gait disorder 12/04/2015  . Diabetes ([place]) 11/26/2015  . Chronic diastolic congestive heart failure ([place]) 11/06/2015  . TIA (transient ischemic attack)   . Carotid-cavernous fistula   . Chest pain 10/31/2015  . Insomnia 10/10/2015  .  Depression 10/10/2015  . Benign essential HTN 08/15/2015  . Fibromyalgia 08/15/2015  . Peripheral neuropathy 08/15/2015  . CAD (coronary artery disease) 08/15/2015  . COPD (chronic obstructive pulmonary disease) ([place]) 08/15/2015  . Osteoporosis 08/15/2015  . 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 DDD (degenerative disc disease), lumbar 08/15/2015  . Occupational exposure to industrial toxins 08/15/2015  . GERD (gastroesophageal reflux disease) 08/15/2015    Current Outpatient Medications on File Prior to Visit  Medication Sig Dispense Refill  . AMITIZA 8 MCG capsule Take 1 capsule by mouth 2 (two) times daily.  0  . amitriptyline (ELAVIL)  50 MG tablet Take 1 tablet by mouth at bedtime 30 tablet 3  . aspirin EC 81 MG tablet Take 1 tablet (81 mg total) by mouth daily.    Marland Kitchen atorvastatin (LIPITOR) 20 MG tablet TAKE 1 TABLET(20 MG) BY MOUTH DAILY 90 tablet 3  . cloNIDine (CATAPRES) 0.3 MG tablet Take 1 tablet (0.3 mg total) by mouth daily. 90 tablet 3  . clotrimazole (LOTRIMIN) 1 % cream Apply to affected area 2 times daily 14 g 0  . COMBIVENT RESPIMAT 20-100 MCG/ACT AERS respimat Inhale 1 puff by mouth every 6 hours 4 g 11  . erythromycin [facility]) ophthalmic ointment Place 1 application into the right eye at bedtime. 3.5 g 0  . fluticasone (FLONASE) 50 MCG/ACT nasal spray SHAKE LIQUID AND INSTILL 1 SPRAY INTO EACH NOSTRIL ONCE DAILY AS NEEDED FOR ALLERGIES 16 g 11  . furosemide (LASIX) 80 MG tablet Take 1 tablet (80 mg total) by mouth daily. 90 tablet 3  . gabapentin (NEURONTIN) 600 MG tablet Take 2 tablets by mouth 3 times a day 180 tablet 2  . glucose blood test strip Use as instructed to check sugar three times a day 200 each 5  . HYDROcodone-acetaminophen (NORCO/VICODIN) 5-325 MG tablet Take 1 tablet by mouth every 4 (four) hours as needed. 15 tablet 0  . hydrocortisone (ANUSOL-HC) 2.5 % rectal cream Apply rectally 2 times daily 28.35 g 0  . Lancets MISC Use as directed twice per day 200 each 5  . levocetirizine (XYZAL) 5 MG tablet TAKE 1 TABLET(5 MG) BY MOUTH EVERY EVENING 90 tablet 0  . lidocaine (XYLOCAINE) 5 % ointment Apply 1 application topically as needed. 106.32 g 0  . losartan (COZAAR) 50 MG tablet Take 1 tablet (50 mg total) by mouth 2 (two) times daily. 180 tablet 3  . metFORMIN (GLUCOPHAGE-XR) 500 MG 24 hr tablet TAKE TWO TABLETS BY MOUTH TWICE DAILY WITH A MEAL 360 tablet 0  . methocarbamol (ROBAXIN) 500 MG tablet Take 1 tablet (500 mg total) by mouth every 6 (six) hours as needed for muscle spasms. 60 tablet 0  . metoprolol (TOPROL-XL) 200 MG 24 hr tablet Take 1 tablet (200 mg total) by mouth 2 (two) times daily. 180  tablet 3  . mometasone (NASONEX) 50 MCG/ACT nasal spray Place 2 sprays into the nose daily. 51 g 1  . mupirocin ointment (BACTROBAN) 2 % Apply 1 application topically 2 (two) times daily. 30 g 0  . mupirocin ointment (BACTROBAN) 2 % Apply 1 application topically 2 (two) times daily. 30 g 2  . nitroGLYCERIN (NITROSTAT) 0.4 MG SL tablet Place 0.4 mg under the tongue every 5 (five) minutes as needed for chest pain.    . Omega 3 1200 MG CAPS Take 1 capsule by mouth daily 90 capsule 1  . omeprazole (PRILOSEC) 40 MG capsule Take 1 capsule (40 mg total) by mouth daily. [place]  capsule 3  . oxyCODONE (OXYCONTIN) 15 mg 12 hr tablet Take 15 mg by mouth every 6 (six) hours.    . potassium chloride SA (K-DUR,KLOR-CON) 20 MEQ tablet Take 1 tablet (20 mEq total) by mouth daily. 90 tablet 3  . rivaroxaban (XARELTO) 10 MG TABS tablet Take 1 tablet (10 mg total) by mouth daily with breakfast. 15 tablet 0  . SYMBICORT 80-4.5 MCG/ACT inhaler Inhale 2 Puffs by mouth twice daily 1 Inhaler 3  . tiZANidine (ZANAFLEX) 4 MG tablet Take 2.5 tablets (10 mg total) by mouth 3 (three) times daily. 225 tablet 5  . traZODone (DESYREL) 50 MG tablet Take 1 tablet by mouth at bedtime as needed for sleep 90 tablet 0  . TRULICITY 1.5 YT/0.3TW SOPN Inject 1.5mg  subcutaneously every Sunday 4 pen 5  . escitalopram (LEXAPRO) 10 MG tablet Take 1 tablet (10 mg total) by mouth daily. 90 tablet 3   No current facility-administered medications on file prior to visit.     Past Medical History:  Diagnosis Date  . Anxiety   . Arthritis   . CHF (congestive heart failure) ([place])   . COPD (chronic obstructive pulmonary disease) ([place])   . Coronary artery disease   . Depression   . Diabetes mellitus without complication ([place])    type 2   . GERD (gastroesophageal reflux disease)   . Hypertension   . Lupus ([place])   . Neuromuscular disorder ([place])   . Osteoporosis   . Oxygen deficiency    pt uses 2.5L 02 at night   . Peripheral neuropathy   .  Sleep apnea    had sleep study done recently ; unaware if she will be getting  a CPAP device ; patient states "im pretty sure i have it , i fall alseep all the time "  . Stroke [facility]) 10/2015    Past Surgical History:  Procedure Laterality Date  . ABDOMINAL HYSTERECTOMY     ovaries left  . APPENDECTOMY    . CHOLECYSTECTOMY    . IR GENERIC HISTORICAL  11/07/2015   IR ANGIO INTRA EXTRACRAN SEL COM CAROTID INNOMINATE BILAT MOD SED 11/07/2015 Luanne Bras, MD [facility]  . IR GENERIC HISTORICAL  11/07/2015   IR ANGIO VERTEBRAL SEL SUBCLAVIAN INNOMINATE UNI R MOD SED 11/07/2015 Luanne Bras, MD [facility]  . IR GENERIC HISTORICAL  11/07/2015   IR ANGIO VERTEBRAL SEL VERTEBRAL UNI L MOD SED 11/07/2015 Luanne Bras, MD [facility]  . IR GENERIC HISTORICAL  11/07/2015   IR ANGIOGRAM EXTREMITY LEFT 11/07/2015 Luanne Bras, MD [facility]  . TONSILLECTOMY    . TOTAL KNEE ARTHROPLASTY Left 03/12/2017   Procedure: LEFT TOTAL KNEE ARTHROPLASTY;  Surgeon: Mcarthur Rossetti, MD;  Location: [facility];  Service: Orthopedics;  Laterality: Left;  Adductor Block    Social History   Socioeconomic History  . Marital status: Single    Spouse name: n/a  . Number of children: 3  . Years of education: 12+  . Highest education level: Not on file  Occupational History  . 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 Financial resource strain: Not on file  . Food insecurity:    Worry: Not on file    Inability: Not on file  . Transportation needs:    Medical: Not on file    Non-medical: Not on file  Tobacco Use  . Smoking status: Current Every Day Smoker  Packs/day: 0.25    Years: 35.00    Pack years: 8.75    Types: Cigarettes  . Smokeless tobacco: Never Used  . Tobacco comment: referred  to smoking  cessation  classes. at  [facility]   Substance and Sexual Activity  . Alcohol use: No     Alcohol/week: 2.0 - 3.0 standard drinks    Types: 2 - 3 Standard drinks or equivalent per week  . 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   Marland Kitchen Sexual activity: Yes    Partners: Female  Lifestyle  . Physical activity:    Days per week: Not on file    Minutes per session: Not on file  . Stress: Not on file  Relationships  . Social connections:    Talks on phone: Not on file    Gets together: Not on file    Attends religious service: Not on file    Active member of club or organization: Not on file    Attends meetings of clubs or organizations: Not on file    Relationship status: Not on file  Other Topics Concern  . Not on file  Social History Narrative   Moved to [place] from [place], [place] February 2017, to help her daughter.   Lives with her daughter.   Sons live in [place] and [place].   She reports that there were originally 17 children in her family (she is the youngest), the oldest are deceased, some prior to her birth, and she isn't sure which were female/female or how they died.    Family History  Problem Relation Age of Onset  . Hyperlipidemia Mother   . Hypertension Mother   . Hyperlipidemia Father   . Hypertension Father   . Stroke Father   . Hypertension Sister   . Cancer Sister        breast cancer  . Crohn's disease Sister     Review of Systems  Constitutional: Negative for fever.  HENT: Positive for rhinorrhea, sore throat (and sore tongue) and trouble swallowing (a little). Negative for congestion and ear pain.   Respiratory: Positive for cough (productive at times), shortness of breath and wheezing (at night).   Cardiovascular: Negative for chest pain, palpitations and leg swelling.  Neurological: Positive for light-headedness (occ) and headaches.       Objective:   Vitals:   01/24/18 1310  BP: (!) 136/92  Pulse: 67  Resp: 18  Temp: 98 F (36.7 C)  SpO2: 96%   BP Readings from Last 3 Encounters:  01/24/18 (!) 136/92  09/28/17 (!) 146/95    09/21/17 110/76   Wt Readings from Last 3 Encounters:  01/24/18 297 lb (134.7 kg)  09/21/17 294 lb (133.4 kg)  08/05/17 287 lb (130.2 kg)   Body mass index is 45.83 kg/m.   Physical Exam    Constitutional: Appears well-developed and well-nourished. No distress.  HENT:  Head: Normocephalic and atraumatic.  Tenderness with palpation left temple region and left TMJ  Neck: Neck supple. No tracheal deviation present. No thyromegaly present.  No cervical lymphadenopathy Cardiovascular: Normal rate, regular rhythm and normal heart sounds.   No murmur heard. No carotid bruit .  No edema Pulmonary/Chest: Effort normal and breath sounds normal. No respiratory distress. No has no wheezes. No rales.  Skin: Skin is warm and dry. Not diaphoretic.  Psychiatric: Normal mood and affect. Behavior is normal.      Assessment & Plan:    See Problem List for Assessment  and Plan of chronic medical problems.

## 2018-01-24 ENCOUNTER — Encounter: Payer: Self-pay | Admitting: Internal Medicine

## 2018-01-24 ENCOUNTER — Ambulatory Visit (INDEPENDENT_AMBULATORY_CARE_PROVIDER_SITE_OTHER): Payer: Medicare Other | Admitting: Internal Medicine

## 2018-01-24 ENCOUNTER — Telehealth: Payer: Self-pay | Admitting: Internal Medicine

## 2018-01-24 ENCOUNTER — Other Ambulatory Visit (INDEPENDENT_AMBULATORY_CARE_PROVIDER_SITE_OTHER): Payer: Medicare Other

## 2018-01-24 VITALS — BP 136/92 | HR 67 | Temp 98.0°F | Resp 18 | Ht 67.5 in | Wt 297.0 lb

## 2018-01-24 DIAGNOSIS — E119 Type 2 diabetes mellitus without complications: Secondary | ICD-10-CM | POA: Diagnosis not present

## 2018-01-24 DIAGNOSIS — Z124 Encounter for screening for malignant neoplasm of cervix: Secondary | ICD-10-CM

## 2018-01-24 DIAGNOSIS — I10 Essential (primary) hypertension: Secondary | ICD-10-CM

## 2018-01-24 DIAGNOSIS — R6884 Jaw pain: Secondary | ICD-10-CM

## 2018-01-24 DIAGNOSIS — R51 Headache: Secondary | ICD-10-CM

## 2018-01-24 DIAGNOSIS — G4700 Insomnia, unspecified: Secondary | ICD-10-CM

## 2018-01-24 DIAGNOSIS — R21 Rash and other nonspecific skin eruption: Secondary | ICD-10-CM

## 2018-01-24 DIAGNOSIS — F32A Depression, unspecified: Secondary | ICD-10-CM

## 2018-01-24 DIAGNOSIS — R519 Headache, unspecified: Secondary | ICD-10-CM

## 2018-01-24 DIAGNOSIS — Z23 Encounter for immunization: Secondary | ICD-10-CM

## 2018-01-24 DIAGNOSIS — F329 Major depressive disorder, single episode, unspecified: Secondary | ICD-10-CM

## 2018-01-24 DIAGNOSIS — B370 Candidal stomatitis: Secondary | ICD-10-CM

## 2018-01-24 LAB — COMPREHENSIVE METABOLIC PANEL
ALT: 20 U/L (ref 0–35)
AST: 24 U/L (ref 0–37)
Albumin: 4.2 g/dL (ref 3.5–5.2)
Alkaline Phosphatase: 109 U/L (ref 39–117)
BUN: 8 mg/dL (ref 6–23)
CO2: 35 mEq/L — ABNORMAL HIGH (ref 19–32)
Calcium: 9.6 mg/dL (ref 8.4–10.5)
Chloride: 98 mEq/L (ref 96–112)
Creatinine, Ser: 0.68 mg/dL (ref 0.40–1.20)
GFR: 115.19 mL/min (ref >60.00)
Glucose, Bld: 92 mg/dL (ref 70–99)
Potassium: 4 mEq/L (ref 3.5–5.1)
Sodium: 139 mEq/L (ref 135–145)
Total Bilirubin: 0.2 mg/dL (ref 0.2–1.2)
Total Protein: 8 g/dL (ref 6.0–8.3)

## 2018-01-24 LAB — CBC WITH DIFFERENTIAL/PLATELET
Basophils Absolute: 0.1 10*3/uL (ref 0.0–0.1)
Basophils Relative: 1.4 % (ref 0.0–3.0)
Eosinophils Absolute: 0.2 10*3/uL (ref 0.0–0.7)
Eosinophils Relative: 2.3 % (ref 0.0–5.0)
HCT: 40.6 % (ref 36.0–46.0)
Hemoglobin: 13.5 g/dL (ref 12.0–15.0)
Lymphocytes Relative: 37.8 % (ref 12.0–46.0)
Lymphs Abs: 3.2 10*3/uL (ref 0.7–4.0)
MCHC: 33.2 g/dL (ref 30.0–36.0)
MCV: 85.7 fl (ref 78.0–100.0)
Monocytes Absolute: 0.7 10*3/uL (ref 0.1–1.0)
Monocytes Relative: 8.2 % (ref 3.0–12.0)
Neutro Abs: 4.2 10*3/uL (ref 1.4–7.7)
Neutrophils Relative %: 50.3 % (ref 43.0–77.0)
Platelets: 328 10*3/uL (ref 150.0–400.0)
RBC: 4.74 Mil/uL (ref 3.87–5.11)
RDW: 16.3 % — ABNORMAL HIGH (ref 11.5–15.5)
WBC: 8.4 10*3/uL (ref 4.0–10.5)

## 2018-01-24 LAB — C-REACTIVE PROTEIN: CRP: 3.3 mg/dL (ref 0.5–20.0)

## 2018-01-24 LAB — SEDIMENTATION RATE: Sed Rate: 85 mm/hr — ABNORMAL HIGH (ref 0–30)

## 2018-01-24 LAB — HEMOGLOBIN A1C: Hgb A1c MFr Bld: 6.6 % — ABNORMAL HIGH (ref 4.6–6.5)

## 2018-01-24 MED ORDER — TRIAMCINOLONE ACETONIDE 0.1 % EX CREA: 1.0000 "application " | TOPICAL_CREAM | Freq: Two times a day (BID) | CUTANEOUS | 0 refills | Status: DC

## 2018-01-24 MED ORDER — NYSTATIN 100000 UNIT/ML MT SUSP: 5.0000 mL | Freq: Four times a day (QID) | OROMUCOSAL | 0 refills | Status: DC

## 2018-01-24 NOTE — Assessment & Plan Note (Signed)
Dry skin and mild rash left posterior ear-possible allergic reaction to bacitracin Triamcinolone cream twice daily Can continue Vaseline Call if no improvement

## 2018-01-24 NOTE — Assessment & Plan Note (Signed)
Sugars at home well controlled Continue current medication Will check A1c Stressed increasing her exercise and working on weight loss

## 2018-01-24 NOTE — Assessment & Plan Note (Signed)
Stable, controlled Continue current medication at current dose

## 2018-01-24 NOTE — Assessment & Plan Note (Signed)
Tongue and throat symptoms consistent with thrush-possibly related to Symbicort Stressed rinsing her mouth out with water every time she uses Symbicort Start nystatin swish and swallow for 10 days Call if no improvement

## 2018-01-24 NOTE — Assessment & Plan Note (Signed)
TMJ versus possible temporal arteritis ESR, CRP Has not seen a dentist in a while Does have jaw pain with yawning Headaches left side of head only-tenderness left temple Further treatment/evaluation depending on blood work

## 2018-01-24 NOTE — Patient Instructions (Addendum)
For the ear we will use a steroid cream twice daily for a few days.  You can continue the vaseline.  For the tongue and mouth we need to do a mouth wash for 10 days.   Make sure you rinse you mouth out with water after you use the symbicort.  Tests ordered today. Your results will be released to [place] (or called to you) after review, usually within 72hours after test completion. If any changes need to be made, you will be notified at that same time.  Flu immunization administered today.    Your prescription(s) have been submitted to your pharmacy. Please take as directed and contact our office if you believe you are having problem(s) with the medication(s).  A referral was ordered for Gyn  Please followup in 6 months

## 2018-01-24 NOTE — Assessment & Plan Note (Signed)
Blood pressure fairly controlled Continue current medications CMP

## 2018-01-24 NOTE — Assessment & Plan Note (Signed)
Controlled, stable Continue current dose of medication  

## 2018-01-25 ENCOUNTER — Other Ambulatory Visit: Payer: Self-pay | Admitting: Internal Medicine

## 2018-01-25 DIAGNOSIS — R700 Elevated erythrocyte sedimentation rate: Secondary | ICD-10-CM

## 2018-01-25 DIAGNOSIS — R51 Headache: Principal | ICD-10-CM

## 2018-01-25 DIAGNOSIS — R519 Headache, unspecified: Secondary | ICD-10-CM

## 2018-01-25 NOTE — Telephone Encounter (Signed)
Is it ok to refill diflucan?

## 2018-01-25 NOTE — Telephone Encounter (Signed)
Pharmacy is aware

## 2018-01-25 NOTE — Telephone Encounter (Signed)
[facility] [facility] wants to know why the refill is not appropriate for fluconazole (DIFLUCAN) 100 MG tablet . Call back @ [phone].

## 2018-01-25 NOTE — Telephone Encounter (Signed)
She is not on this chronically and a refill is not appropriate

## 2018-01-31 ENCOUNTER — Telehealth: Payer: Self-pay | Admitting: Certified Nurse Midwife

## 2018-01-31 DIAGNOSIS — M25562 Pain in left knee: Secondary | ICD-10-CM | POA: Diagnosis not present

## 2018-01-31 DIAGNOSIS — G894 Chronic pain syndrome: Secondary | ICD-10-CM | POA: Diagnosis not present

## 2018-01-31 DIAGNOSIS — M25559 Pain in unspecified hip: Secondary | ICD-10-CM | POA: Diagnosis not present

## 2018-01-31 DIAGNOSIS — Z79891 Long term (current) use of opiate analgesic: Secondary | ICD-10-CM | POA: Diagnosis not present

## 2018-01-31 DIAGNOSIS — M545 Low back pain: Secondary | ICD-10-CM | POA: Diagnosis not present

## 2018-01-31 NOTE — Telephone Encounter (Signed)
Called and left a message for patient to call back to schedule a new patient doctor referral appointment with our office to see any provider for: Wellness visit.

## 2018-02-09 DIAGNOSIS — M791 Myalgia, unspecified site: Secondary | ICD-10-CM | POA: Diagnosis not present

## 2018-02-17 ENCOUNTER — Other Ambulatory Visit: Payer: Self-pay | Admitting: Internal Medicine

## 2018-02-17 DIAGNOSIS — M5136 Other intervertebral disc degeneration, lumbar region: Secondary | ICD-10-CM

## 2018-02-22 ENCOUNTER — Other Ambulatory Visit: Payer: Self-pay

## 2018-02-22 ENCOUNTER — Ambulatory Visit (INDEPENDENT_AMBULATORY_CARE_PROVIDER_SITE_OTHER): Payer: Medicare Other | Admitting: Certified Nurse Midwife

## 2018-02-22 ENCOUNTER — Encounter: Payer: Self-pay | Admitting: Certified Nurse Midwife

## 2018-02-22 VITALS — BP 116/70 | HR 70 | Resp 16 | Ht 66.0 in | Wt 295.0 lb

## 2018-02-22 DIAGNOSIS — Z01419 Encounter for gynecological examination (general) (routine) without abnormal findings: Secondary | ICD-10-CM

## 2018-02-22 DIAGNOSIS — N898 Other specified noninflammatory disorders of vagina: Secondary | ICD-10-CM

## 2018-02-22 DIAGNOSIS — N951 Menopausal and female climacteric states: Secondary | ICD-10-CM

## 2018-02-22 DIAGNOSIS — M79641 Pain in right hand: Secondary | ICD-10-CM | POA: Diagnosis not present

## 2018-02-22 DIAGNOSIS — M19042 Primary osteoarthritis, left hand: Secondary | ICD-10-CM | POA: Diagnosis not present

## 2018-02-22 DIAGNOSIS — M2550 Pain in unspecified joint: Secondary | ICD-10-CM | POA: Diagnosis not present

## 2018-02-22 DIAGNOSIS — R51 Headache: Secondary | ICD-10-CM | POA: Diagnosis not present

## 2018-02-22 DIAGNOSIS — M19041 Primary osteoarthritis, right hand: Secondary | ICD-10-CM | POA: Diagnosis not present

## 2018-02-22 DIAGNOSIS — M79642 Pain in left hand: Secondary | ICD-10-CM | POA: Diagnosis not present

## 2018-02-22 DIAGNOSIS — R700 Elevated erythrocyte sedimentation rate: Secondary | ICD-10-CM | POA: Diagnosis not present

## 2018-02-22 DIAGNOSIS — M79643 Pain in unspecified hand: Secondary | ICD-10-CM | POA: Diagnosis not present

## 2018-02-22 NOTE — Progress Notes (Addendum)
57 y.o. [id number] Single  African American Fe here to establish gyn care and for annual gyn exam. Last gyn exam per patient was 83 with her hysterectomy for uterine prolapse after last vaginal delivery 14 pound baby. Had abdominal hysterectomy with ovaries retained. No history of abnormal pap smears that she is aware.. Complaining of vaginal discharge that is watery off and on and itching at times. History of yeast in vaginal area frequently, she treats self. Has not had exam to see if this is the problem. Not sexually active in many years and does not plan to be at this point. History of oral thrush in past, with PCP management. Patient is currently disabled. She sees Billey Gosling, MD for management of her medical issues of Diabetes,Hypertension, Osteoporosis, Fibromyalgia. She had a TIA 2017, but feels her memory and speech are fine. Not seeing neurology now per patient. No gyn issues other than vaginal concern. Has good family support.  No LMP recorded. Patient has had a hysterectomy.          Sexually active: No.  The current method of family planning is status post hysterectomy.    Exercising: No.  exercise Smoker:  no  Review of Systems  Constitutional: Negative.   HENT: Negative.   Eyes: Negative.   Respiratory: Negative.   Cardiovascular: Negative.   Gastrointestinal: Negative.   Genitourinary: Negative.   Musculoskeletal: Negative.   Skin: Negative.   Neurological: Negative.   Endo/Heme/Allergies: Negative.   Psychiatric/Behavioral: Negative.     Health Maintenance: Pap:  1989 History of Abnormal Pap: no MMG:  2018 neg per patient Self Breast exams: yes Colonoscopy:  2018 normal BMD:   none TDaP:  2019 Shingles: had done Pneumonia: 2018, 2019 Hep C and HIV: both neg 2018 Labs: if  needed   reports that she has been smoking cigarettes. She has a 8.75 pack-year smoking history. She has never used smokeless tobacco. She reports that she does not drink alcohol or use  drugs.  Past Medical History:  Diagnosis Date  . Anxiety   . Arthritis   . CHF (congestive heart failure) ([place])   . COPD (chronic obstructive pulmonary disease) ([place])   . Coronary artery disease   . Depression   . Diabetes mellitus without complication ([place])    type 2   . GERD (gastroesophageal reflux disease)   . Hypertension   . Lupus ([place])   . Migraines   . Neuromuscular disorder ([place])   . Osteoporosis   . Oxygen deficiency    pt uses 2.5L 02 at night   . Peripheral neuropathy   . Sleep apnea    had sleep study done recently ; unaware if she will be getting  a CPAP device ; patient states "im pretty sure i have it , i fall alseep all the time "  . Stroke [facility]) 10/2015    Past Surgical History:  Procedure Laterality Date  . ABDOMINAL HYSTERECTOMY     ovaries left  . APPENDECTOMY    . CHOLECYSTECTOMY    . IR GENERIC HISTORICAL  11/07/2015   IR ANGIO INTRA EXTRACRAN SEL COM CAROTID INNOMINATE BILAT MOD SED 11/07/2015 Luanne Bras, MD [facility]  . IR GENERIC HISTORICAL  11/07/2015   IR ANGIO VERTEBRAL SEL SUBCLAVIAN INNOMINATE UNI R MOD SED 11/07/2015 Luanne Bras, MD [facility]  . IR GENERIC HISTORICAL  11/07/2015   IR ANGIO VERTEBRAL SEL VERTEBRAL UNI L MOD SED 11/07/2015 Luanne Bras, MD [facility]  . IR GENERIC HISTORICAL  11/07/2015   IR ANGIOGRAM EXTREMITY LEFT 11/07/2015 Luanne Bras, MD [facility]  . TONSILLECTOMY    . TOTAL KNEE ARTHROPLASTY Left 03/12/2017   Procedure: LEFT TOTAL KNEE ARTHROPLASTY;  Surgeon: Mcarthur Rossetti, MD;  Location: [facility];  Service: Orthopedics;  Laterality: Left;  Adductor Block    Current Outpatient Medications  Medication Sig Dispense Refill  . amitriptyline (ELAVIL) 50 MG tablet Take 1 tablet by mouth at bedtime 30 tablet 3  . aspirin EC 81 MG tablet Take 1 tablet (81 mg total) by mouth daily.    . cloNIDine (CATAPRES) 0.3 MG tablet Take 1 tablet (0.3 mg total) by mouth daily. 90 tablet 3  .  fluticasone (FLONASE) 50 MCG/ACT nasal spray SHAKE LIQUID AND INSTILL 1 SPRAY INTO EACH NOSTRIL ONCE DAILY AS NEEDED FOR ALLERGIES 16 g 11  . furosemide (LASIX) 80 MG tablet Take 1 tablet (80 mg total) by mouth daily. 90 tablet 3  . gabapentin (NEURONTIN) 600 MG tablet Take 2 tablets by mouth 3 times a day 180 tablet 2  . glucose blood test strip Use as instructed to check sugar three times a day 200 each 5  . Lancets MISC Use as directed twice per day 200 each 5  . losartan (COZAAR) 50 MG tablet Take 1 tablet (50 mg total) by mouth 2 (two) times daily. 180 tablet 3  . metFORMIN (GLUCOPHAGE-XR) 500 MG 24 hr tablet TAKE TWO TABLETS BY MOUTH TWICE DAILY WITH A MEAL 360 tablet 0  . metoprolol (TOPROL-XL) 200 MG 24 hr tablet Take 1 tablet (200 mg total) by mouth 2 (two) times daily. 180 tablet 3  . mometasone (NASONEX) 50 MCG/ACT nasal spray Place 2 sprays into the nose daily. 51 g 1  . omeprazole (PRILOSEC) 40 MG capsule Take 1 capsule (40 mg total) by mouth daily. 90 capsule 3  . oxyCODONE-acetaminophen (PERCOCET) 10-325 MG tablet TK 1 T PO Q 4 H PRF PAIN  0  . potassium chloride SA (K-DUR,KLOR-CON) 20 MEQ tablet Take 1 tablet (20 mEq total) by mouth daily. 90 tablet 3  . SYMBICORT 80-4.5 MCG/ACT inhaler Inhale 2 Puffs by mouth twice daily 1 Inhaler 3  . tiZANidine (ZANAFLEX) 4 MG tablet Take 2.5 tablets by mouth 3 times a day 225 tablet 5  . TRULICITY 1.5 XB/1.4NW SOPN Inject 1.5mg  subcutaneously every Sunday 4 pen 5  . atorvastatin (LIPITOR) 20 MG tablet TAKE 1 TABLET(20 MG) BY MOUTH DAILY 90 tablet 3  . nitroGLYCERIN (NITROSTAT) 0.4 MG SL tablet Place 0.4 mg under the tongue every 5 (five) minutes as needed for chest pain.     No current facility-administered medications for this visit.     Family History  Problem Relation Age of Onset  . Hyperlipidemia Mother   . Hypertension Mother   . Hyperlipidemia Father   . Hypertension Father   . Stroke Father   . Hypertension Sister   . Cancer  Sister        breast cancer  . Crohn's disease Sister     ROS:  Pertinent items are noted in HPI.  Otherwise, a comprehensive ROS was negative.  Exam:   BP 116/70   Pulse 70   Resp 16   Ht 5\' 6"  (1.676 m)   Wt 295 lb (133.8 kg)   BMI 47.61 kg/m  Height: 5\' 6"  (167.6 cm) Ht Readings from Last 3 Encounters:  02/22/18 5\' 6"  (1.676 m)  01/24/18 5' 7.5" (1.715 m)  08/05/17 5' 7.5" (1.715 m)  General appearance: alert, cooperative and appears stated age Head: Normocephalic, without obvious abnormality, atraumatic Neck: no adenopathy, supple, symmetrical, trachea midline and thyroid normal to inspection and palpation Lungs: clear to auscultation bilaterally Breasts: normal appearance, no masses or tenderness, No nipple retraction or dimpling, No nipple discharge or bleeding, No axillary or supraclavicular adenopathy Heart: regular rate and rhythm Abdomen: soft, non-tender; no masses,  no organomegaly Extremities: extremities normal, atraumatic, no cyanosis or edema Skin: Skin color, texture, turgor normal. No rashes or lesions Lymph nodes: Cervical, supraclavicular, and axillary nodes normal. No abnormal inguinal nodes palpated Neurologic: Grossly normal   Pelvic: External genitalia:  no lesions, normal female with slight discharge noted on skin at introitus only              Urethra:  normal appearing urethra with no masses, tenderness or lesions              Bartholin's and Skene's: normal                 Vagina: normal appearing vagina with normal color and white thin non odorous discharge, no lesions. Affirm taken              Cervix: absent              Pap taken: No. Bimanual Exam:  Uterus:  uterus absent              Adnexa: normal adnexa and no mass, fullness, tenderness               Rectovaginal: Confirms               Anus:  normal sphincter tone, no lesions  Chaperone present: yes  A:  Well Woman with normal exam  History of TAH for prolapse with ovaries  retained  History of frequent vaginal infections self treatment not confirmed  R/O vaginitis  Family history of breast cancer sister age 19  Obesity, Diabetes, Hypertension, GERD,osteoporosis management with MD  History of TIA  Mammogram due  P:  Discussed normal pelvic exam findings and no cervix present, so pap smear not recommended routinely unless cancer history. (patient again no history of cancer or abnormal pap smear)  Discussed no abnormal appearing discharge.  Lab: Affirm and will treat vaginally if indicated.  Continue follow up with MD as indicated with other medical problems.  Given information to call and schedule mammogram. Stressed importance of SBE and mammogram.  Pap smear: no    Reviewed findings of exam and pertinent information..   counseled on breast self exam, mammography screening, STD prevention, HIV risk factors and prevention, feminine hygiene, menopause, adequate intake of calcium and vitamin D, diet and exercise return annually or prn  An After Visit Summary was printed and given to the patient.

## 2018-02-22 NOTE — Patient Instructions (Signed)

## 2018-02-23 ENCOUNTER — Other Ambulatory Visit: Payer: Self-pay | Admitting: Certified Nurse Midwife

## 2018-02-23 LAB — VAGINITIS/VAGINOSIS, DNA PROBE
Candida Species: NEGATIVE
Gardnerella vaginalis: POSITIVE — AB
Trichomonas vaginosis: NEGATIVE

## 2018-02-24 ENCOUNTER — Other Ambulatory Visit: Payer: Self-pay

## 2018-02-24 NOTE — Telephone Encounter (Signed)
-----   Message from Regina Eck, CNM sent at 02/24/2018  7:49 AM EST ----- Notify patient her vaginal screen was negative for yeast ( stop any self treatment for this) and trichomonas Positive for BV   Needs Rx Metrogel one applicator every hs x 7  She should expect increase in discharge during treatment and up to 1 week after, this is the clearing and restoring vaginal flora

## 2018-02-24 NOTE — Telephone Encounter (Signed)
Left message for call back.

## 2018-02-25 NOTE — Telephone Encounter (Signed)
Left message for call back.

## 2018-02-25 NOTE — Telephone Encounter (Signed)
Patient returned call to [place]. °

## 2018-02-28 DIAGNOSIS — I10 Essential (primary) hypertension: Secondary | ICD-10-CM | POA: Diagnosis not present

## 2018-02-28 DIAGNOSIS — G894 Chronic pain syndrome: Secondary | ICD-10-CM | POA: Diagnosis not present

## 2018-02-28 DIAGNOSIS — M545 Low back pain: Secondary | ICD-10-CM | POA: Diagnosis not present

## 2018-02-28 DIAGNOSIS — M25562 Pain in left knee: Secondary | ICD-10-CM | POA: Diagnosis not present

## 2018-02-28 DIAGNOSIS — Z79891 Long term (current) use of opiate analgesic: Secondary | ICD-10-CM | POA: Diagnosis not present

## 2018-02-28 DIAGNOSIS — M25559 Pain in unspecified hip: Secondary | ICD-10-CM | POA: Diagnosis not present

## 2018-02-28 MED ORDER — METRONIDAZOLE 0.75 % VA GEL: 1.0000 | Freq: Every day | VAGINAL | 0 refills | Status: AC

## 2018-02-28 NOTE — Telephone Encounter (Signed)
Patient is returning a call to [place]. Caryl Asp is out of the office today. Patient  Is hoping some one could call her today with her results.

## 2018-02-28 NOTE — Telephone Encounter (Signed)
Left message to call Sharee Pimple, RN at [facility].   May speak with any available triage nurse.

## 2018-02-28 NOTE — Telephone Encounter (Signed)
Patient returned call. Results reviewed with patient and she verbalized understanding. Instructions on use of metrogel reviewed with patient and she verbalized understanding. RX for [employer], 0RF sent to [facility] on [address] per patient request.   Routing to provider and will close encounter.

## 2018-03-03 ENCOUNTER — Other Ambulatory Visit: Payer: Self-pay | Admitting: Internal Medicine

## 2018-03-03 DIAGNOSIS — R700 Elevated erythrocyte sedimentation rate: Secondary | ICD-10-CM

## 2018-03-03 DIAGNOSIS — M316 Other giant cell arteritis: Secondary | ICD-10-CM

## 2018-03-12 DIAGNOSIS — M791 Myalgia, unspecified site: Secondary | ICD-10-CM | POA: Diagnosis not present

## 2018-03-19 ENCOUNTER — Other Ambulatory Visit: Payer: Self-pay | Admitting: Internal Medicine

## 2018-03-28 DIAGNOSIS — Z79891 Long term (current) use of opiate analgesic: Secondary | ICD-10-CM | POA: Diagnosis not present

## 2018-03-28 DIAGNOSIS — G894 Chronic pain syndrome: Secondary | ICD-10-CM | POA: Diagnosis not present

## 2018-03-28 DIAGNOSIS — M25562 Pain in left knee: Secondary | ICD-10-CM | POA: Diagnosis not present

## 2018-03-28 DIAGNOSIS — M545 Low back pain: Secondary | ICD-10-CM | POA: Diagnosis not present

## 2018-03-28 DIAGNOSIS — M25559 Pain in unspecified hip: Secondary | ICD-10-CM | POA: Diagnosis not present

## 2018-04-11 ENCOUNTER — Other Ambulatory Visit: Payer: Self-pay | Admitting: Internal Medicine

## 2018-04-11 DIAGNOSIS — M791 Myalgia, unspecified site: Secondary | ICD-10-CM | POA: Diagnosis not present

## 2018-04-20 ENCOUNTER — Other Ambulatory Visit: Payer: Self-pay | Admitting: Internal Medicine

## 2018-04-21 ENCOUNTER — Other Ambulatory Visit: Payer: Self-pay | Admitting: Internal Medicine

## 2018-04-27 DIAGNOSIS — M545 Low back pain: Secondary | ICD-10-CM | POA: Diagnosis not present

## 2018-04-27 DIAGNOSIS — G894 Chronic pain syndrome: Secondary | ICD-10-CM | POA: Diagnosis not present

## 2018-04-27 DIAGNOSIS — M25562 Pain in left knee: Secondary | ICD-10-CM | POA: Diagnosis not present

## 2018-04-27 DIAGNOSIS — M5416 Radiculopathy, lumbar region: Secondary | ICD-10-CM | POA: Diagnosis not present

## 2018-04-27 DIAGNOSIS — Z79891 Long term (current) use of opiate analgesic: Secondary | ICD-10-CM | POA: Diagnosis not present

## 2018-04-27 DIAGNOSIS — M25559 Pain in unspecified hip: Secondary | ICD-10-CM | POA: Diagnosis not present

## 2018-05-03 ENCOUNTER — Ambulatory Visit (INDEPENDENT_AMBULATORY_CARE_PROVIDER_SITE_OTHER): Payer: Medicare Other | Admitting: Orthopaedic Surgery

## 2018-05-11 ENCOUNTER — Other Ambulatory Visit: Payer: Self-pay | Admitting: Internal Medicine

## 2018-05-11 NOTE — Patient Instructions (Addendum)
  Tests ordered today. Your results will be released to [place] (or called to you) after review, usually within 72hours after test completion. If any changes need to be made, you will be notified at that same time.   Medications reviewed and updated.  Changes include :   Stop the trazodone.  Start prednisone taper and doxycyline for your infection and wheezing.  Decrease the tizanidine to two pills twice a day.    Your prescription(s) have been submitted to your pharmacy. Please take as directed and contact our office if you believe you are having problem(s) with the medication(s).    Please followup in 6 months

## 2018-05-11 NOTE — Progress Notes (Signed)
Subjective:    Patient ID: Tammy Strickland, female    DOB: 21-Mar-1961, 58 y.o.   MRN: [id number]  HPI The patient is here for follow up.  Wheezing;  She has been wheezing and is coughing.  She brings up thick, yellow sputum.  She is still smoking.  She does have shortness of breath and sore throat.  She denies any fevers, nasal congestion or ear pain.  She denies nausea, diarrhea and headaches.  Rheum - ? ENT bx - there was concern for possible temporal arteritis.  She never had the biopsy.  She was afraid.  She states the headaches resolved.   Diabetes: She is taking her medication daily as prescribed. She is compliant with a diabetic diet. She is not exercising regularly. She monitors her sugars and they have been running < 140.  She is up-to-date with an ophthalmology examination.   Hypertension: She is taking her medication daily. She is compliant with a low sodium diet.  She denies chest pain, edema and regular headaches. She is not exercising regularly.  She does not monitor her blood pressure at home.    Hyperlipidemia: She is taking her medication daily. She is compliant with a low fat/cholesterol diet. She is not exercising regularly. She denies myalgias.   Insomnia:  She is taking trazodone nightly. She is not is sleeping good.  She falls asleep when watching TV.  She does sleep a lot during the day.  She thinks the tizanidine makes her sleep - she takes it three times a day.   Depression: She is taking her medication daily as prescribed. She denies any side effects from the medication. She feels her depression is well controlled and she is happy with her current dose of medication.   Morbid obesity:  She is not exercising regularly.  She states she does not eat much.    Boils on her lower abdomen:  They are sore and itchy.  She noticed them about one week ago.    Medications and allergies reviewed with patient and updated if appropriate.  Patient Active Problem List   Diagnosis Date Noted  . Thrush 01/24/2018  . Jaw pain 01/24/2018  . It band syndrome, left 12/29/2017  . Trochanteric bursitis, left hip 12/29/2017  . Cough 08/05/2017  . Dysphagia 04/29/2017  . History of left knee replacement 04/21/2017  . Pain in left ankle and joints of left foot 04/21/2017  . Pain in left hand 04/21/2017  . OSA (obstructive sleep apnea) 02/23/2017  . Acute gouty arthritis 12/25/2016  . Stroke ([place])   . Chronic fatigue 11/09/2016  . Hypoxia 11/09/2016  . Morbidly obese ([place]) 11/09/2016  . Rash and nonspecific skin eruption 07/21/2016  . Frequent headaches 07/21/2016  . Allergic rhinitis 07/16/2016  . Vitamin D deficiency 04/16/2016  . Hyperlipidemia 01/15/2016  . Gait disorder 12/04/2015  . Diabetes ([place]) 11/26/2015  . Chronic diastolic congestive heart failure ([place]) 11/06/2015  . TIA (transient ischemic attack)   . Carotid-cavernous fistula   . Chest pain 10/31/2015  . Insomnia 10/10/2015  . Depression 10/10/2015  . Benign essential HTN 08/15/2015  . Fibromyalgia 08/15/2015  . Peripheral neuropathy 08/15/2015  . CAD (coronary artery disease) 08/15/2015  . COPD (chronic obstructive pulmonary disease) ([place]) 08/15/2015  . Osteoporosis 08/15/2015  . 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 DDD (degenerative disc disease), lumbar 08/15/2015  . Occupational exposure to industrial toxins 08/15/2015  . GERD (gastroesophageal reflux disease) 08/15/2015    Current Outpatient Medications on File  Prior to Visit  Medication Sig Dispense Refill  . amitriptyline (ELAVIL) 50 MG tablet Take 1 tablet by mouth at bedtime 30 tablet 3  . aspirin EC 81 MG tablet Take 1 tablet (81 mg total) by mouth daily.    Marland Kitchen atorvastatin (LIPITOR) 20 MG tablet TAKE 1 TABLET(20 MG) BY MOUTH DAILY 90 tablet 1  . cloNIDine (CATAPRES) 0.3 MG tablet Take 1 tablet (0.3 mg total) by mouth daily. 90 tablet 3  . fluticasone (FLONASE) 50 MCG/ACT nasal spray Shake liquid & use 1 spray into each nostril every  day as needed for allergies 16 g 2  . furosemide (LASIX) 80 MG tablet Take 1 tablet (80 mg total) by mouth daily. 90 tablet 3  . gabapentin (NEURONTIN) 600 MG tablet Take 2 tablets by mouth 3 times a day 180 tablet 3  . glucose blood test strip Use as instructed to check sugar three times a day 200 each 5  . Lancets MISC Use as directed twice per day 200 each 5  . losartan (COZAAR) 50 MG tablet Take 1 tablet (50 mg total) by mouth 2 (two) times daily. 180 tablet 3  . metFORMIN (GLUCOPHAGE-XR) 500 MG 24 hr tablet Take 2 tablets by mouth twice daily with a meal 120 tablet 3  . metoprolol (TOPROL-XL) 200 MG 24 hr tablet Take 1 tablet (200 mg total) by mouth 2 (two) times daily. 180 tablet 3  . mometasone (NASONEX) 50 MCG/ACT nasal spray Place 2 sprays into the nose daily. 51 g 1  . nitroGLYCERIN (NITROSTAT) 0.4 MG SL tablet Place 0.4 mg under the tongue every 5 (five) minutes as needed for chest pain.    Marland Kitchen omeprazole (PRILOSEC) 40 MG capsule Take 1 capsule (40 mg total) by mouth daily. 90 capsule 3  . oxyCODONE-acetaminophen (PERCOCET) 10-325 MG tablet TK 1 T PO Q 4 H PRF PAIN  0  . potassium chloride SA (K-DUR,KLOR-CON) 20 MEQ tablet Take 1 tablet (20 mEq total) by mouth daily. 90 tablet 3  . SYMBICORT 80-4.5 MCG/ACT inhaler Inhale 2 Puffs by mouth twice daily 1 Inhaler 3  . tiZANidine (ZANAFLEX) 4 MG tablet Take 2.5 tablets by mouth 3 times a day 225 tablet 5  . traZODone (DESYREL) 50 MG tablet Take 1 tablet by mouth at bedtime as needed for sleep 30 tablet 3  . TRULICITY 1.5 VP/7.1GG SOPN Inject 1.5mg  subcutaneously every Sunday 4 pen 2   No current facility-administered medications on file prior to visit.     Past Medical History:  Diagnosis Date  . Anxiety   . Arthritis   . CHF (congestive heart failure) ([place])   . COPD (chronic obstructive pulmonary disease) ([place])   . Coronary artery disease   . Depression   . Diabetes mellitus without complication ([place])    type 2   . GERD  (gastroesophageal reflux disease)   . Hypertension   . Lupus ([place])   . Migraines   . Neuromuscular disorder ([place])   . Osteoporosis   . Oxygen deficiency    pt uses 2.5L 02 at night   . Peripheral neuropathy   . Shingles   . Sleep apnea    had sleep study done recently ; unaware if she will be getting  a CPAP device ; patient states "im pretty sure i have it , i fall alseep all the time "  . Stroke [facility]) 10/2015    Past Surgical History:  Procedure Laterality Date  . ABDOMINAL HYSTERECTOMY  ovaries left  . APPENDECTOMY    . CHOLECYSTECTOMY    . IR GENERIC HISTORICAL  11/07/2015   IR ANGIO INTRA EXTRACRAN SEL COM CAROTID INNOMINATE BILAT MOD SED 11/07/2015 Luanne Bras, MD [facility]  . IR GENERIC HISTORICAL  11/07/2015   IR ANGIO VERTEBRAL SEL SUBCLAVIAN INNOMINATE UNI R MOD SED 11/07/2015 Luanne Bras, MD [facility]  . IR GENERIC HISTORICAL  11/07/2015   IR ANGIO VERTEBRAL SEL VERTEBRAL UNI L MOD SED 11/07/2015 Luanne Bras, MD [facility]  . IR GENERIC HISTORICAL  11/07/2015   IR ANGIOGRAM EXTREMITY LEFT 11/07/2015 Luanne Bras, MD [facility]  . TONSILLECTOMY    . TOTAL KNEE ARTHROPLASTY Left 03/12/2017   Procedure: LEFT TOTAL KNEE ARTHROPLASTY;  Surgeon: Mcarthur Rossetti, MD;  Location: [facility];  Service: Orthopedics;  Laterality: Left;  Adductor Block    Social History   Socioeconomic History  . Marital status: Single    Spouse name: n/a  . Number of children: 3  . Years of education: 12+  . Highest education level: Not on file  Occupational History  . 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 Financial resource strain: Not on file  . Food insecurity:    Worry: Not on file    Inability: Not on file  . Transportation needs:    Medical: Not on file    Non-medical: Not on file  Tobacco Use  . Smoking status: Current Every Day Smoker     Packs/day: 0.25    Years: 35.00    Pack years: 8.75    Types: Cigarettes  . Smokeless tobacco: Never Used  . Tobacco comment: referred  to smoking  cessation  classes. at  [facility]   Substance and Sexual Activity  . Alcohol use: No  . Drug use: No  . Sexual activity: Not Currently    Partners: Female    Birth control/protection: Surgical    Comment: hysterectomy  Lifestyle  . Physical activity:    Days per week: Not on file    Minutes per session: Not on file  . Stress: Not on file  Relationships  . Social connections:    Talks on phone: Not on file    Gets together: Not on file    Attends religious service: Not on file    Active member of club or organization: Not on file    Attends meetings of clubs or organizations: Not on file    Relationship status: Not on file  Other Topics Concern  . Not on file  Social History Narrative   Moved to [place] from [place], [place] February 2017, to help her daughter.   Lives with her daughter.   Sons live in [place] and [place].   She reports that there were originally 17 children in her family (she is the youngest), the oldest are deceased, some prior to her birth, and she isn't sure which were female/female or how they died.    Family History  Problem Relation Age of Onset  . Hyperlipidemia Mother   . Hypertension Mother   . Stroke Mother   . Thyroid disease Mother   . Heart attack Mother   . Hyperlipidemia Father   . Hypertension Father   . Stroke Father   . Heart attack Father   . Hypertension Sister   . Cancer Sister        breast cancer  . Stroke Sister   . Thyroid  disease Sister   . Crohn's disease Sister   . Hypertension Sister   . Hypertension Brother   . Diabetes Brother   . Hypertension Brother     Review of Systems  Constitutional: Negative for fever.  HENT: Positive for sore throat (mild). Negative for congestion and ear pain.   Respiratory: Positive for cough (productive of thick, yellow sputum), shortness of  breath and wheezing.   Cardiovascular: Positive for palpitations (occ). Negative for chest pain and leg swelling.  Gastrointestinal: Negative for diarrhea and nausea.  Neurological: Negative for light-headedness and headaches.       Objective:   Vitals:   05/12/18 0739  BP: 108/62  Pulse: 84  Resp: 16  Temp: 98.4 F (36.9 C)  SpO2: 93%   BP Readings from Last 3 Encounters:  05/12/18 108/62  02/22/18 116/70  01/24/18 (!) 136/92   Wt Readings from Last 3 Encounters:  05/12/18 (!) 310 lb (140.6 kg)  02/22/18 295 lb (133.8 kg)  01/24/18 297 lb (134.7 kg)   Body mass index is 50.04 kg/m.   Physical Exam    Constitutional: Appears well-developed and well-nourished. No distress.  HENT:  Head: Normocephalic and atraumatic.  Neck: Neck supple. No tracheal deviation present. No thyromegaly present.  No cervical lymphadenopathy Cardiovascular: Normal rate, regular rhythm and normal heart sounds.   No murmur heard. No carotid bruit .  No edema Pulmonary/Chest:  No respiratory distress. Good air entry.  Diffuse expiratory wheeze. No rales. Abdomen: Morbidly obese.  Soft, mild suprapubic boil that is tender-no discharge; boils lower abdomen under pannus-1 of which is open without active discharge or bleeding Skin: Skin is warm and dry. Not diaphoretic.  Psychiatric: Normal mood and affect. Behavior is normal.      Assessment & Plan:    See Problem List for Assessment and Plan of chronic medical problems.

## 2018-05-12 ENCOUNTER — Ambulatory Visit (INDEPENDENT_AMBULATORY_CARE_PROVIDER_SITE_OTHER): Payer: Medicare HMO | Admitting: Internal Medicine

## 2018-05-12 ENCOUNTER — Encounter: Payer: Self-pay | Admitting: Internal Medicine

## 2018-05-12 ENCOUNTER — Other Ambulatory Visit (INDEPENDENT_AMBULATORY_CARE_PROVIDER_SITE_OTHER): Payer: Medicare HMO

## 2018-05-12 VITALS — BP 108/62 | HR 84 | Temp 98.4°F | Resp 16 | Ht 66.0 in | Wt 310.0 lb

## 2018-05-12 DIAGNOSIS — E119 Type 2 diabetes mellitus without complications: Secondary | ICD-10-CM | POA: Diagnosis not present

## 2018-05-12 DIAGNOSIS — G4700 Insomnia, unspecified: Secondary | ICD-10-CM | POA: Diagnosis not present

## 2018-05-12 DIAGNOSIS — L0292 Furuncle, unspecified: Secondary | ICD-10-CM

## 2018-05-12 DIAGNOSIS — I10 Essential (primary) hypertension: Secondary | ICD-10-CM

## 2018-05-12 DIAGNOSIS — E7849 Other hyperlipidemia: Secondary | ICD-10-CM

## 2018-05-12 DIAGNOSIS — F329 Major depressive disorder, single episode, unspecified: Secondary | ICD-10-CM | POA: Diagnosis not present

## 2018-05-12 DIAGNOSIS — L02429 Furuncle of limb, unspecified: Secondary | ICD-10-CM | POA: Insufficient documentation

## 2018-05-12 DIAGNOSIS — J209 Acute bronchitis, unspecified: Secondary | ICD-10-CM

## 2018-05-12 DIAGNOSIS — F32A Depression, unspecified: Secondary | ICD-10-CM

## 2018-05-12 DIAGNOSIS — M5136 Other intervertebral disc degeneration, lumbar region: Secondary | ICD-10-CM | POA: Diagnosis not present

## 2018-05-12 DIAGNOSIS — M51369 Other intervertebral disc degeneration, lumbar region without mention of lumbar back pain or lower extremity pain: Secondary | ICD-10-CM

## 2018-05-12 DIAGNOSIS — J440 Chronic obstructive pulmonary disease with acute lower respiratory infection: Secondary | ICD-10-CM

## 2018-05-12 LAB — LIPID PANEL
Cholesterol: 113 mg/dL (ref 0–200)
HDL: 36.1 mg/dL — ABNORMAL LOW (ref >39.00)
LDL Cholesterol: 52 mg/dL (ref 0–99)
NonHDL: 77.19
Total CHOL/HDL Ratio: 3
Triglycerides: 124 mg/dL (ref 0.0–149.0)
VLDL: 24.8 mg/dL (ref 0.0–40.0)

## 2018-05-12 LAB — HEMOGLOBIN A1C: Hgb A1c MFr Bld: 9.1 % — ABNORMAL HIGH (ref 4.6–6.5)

## 2018-05-12 LAB — COMPREHENSIVE METABOLIC PANEL
ALT: 41 U/L — ABNORMAL HIGH (ref 0–35)
AST: 51 U/L — ABNORMAL HIGH (ref 0–37)
Albumin: 4 g/dL (ref 3.5–5.2)
Alkaline Phosphatase: 118 U/L — ABNORMAL HIGH (ref 39–117)
BUN: 11 mg/dL (ref 6–23)
CO2: 28 mEq/L (ref 19–32)
Calcium: 9.1 mg/dL (ref 8.4–10.5)
Chloride: 95 mEq/L — ABNORMAL LOW (ref 96–112)
Creatinine, Ser: 0.82 mg/dL (ref 0.40–1.20)
GFR: 87.22 mL/min (ref >60.00)
Glucose, Bld: 356 mg/dL — ABNORMAL HIGH (ref 70–99)
Potassium: 4.5 mEq/L (ref 3.5–5.1)
Sodium: 135 mEq/L (ref 135–145)
Total Bilirubin: 0.3 mg/dL (ref 0.2–1.2)
Total Protein: 6.9 g/dL (ref 6.0–8.3)

## 2018-05-12 MED ORDER — DOXYCYCLINE HYCLATE 100 MG PO TABS: 100.0000 mg | ORAL_TABLET | Freq: Two times a day (BID) | ORAL | 0 refills | Status: DC

## 2018-05-12 MED ORDER — PREDNISONE 10 MG PO TABS: ORAL_TABLET | ORAL | 0 refills | Status: DC

## 2018-05-12 MED ORDER — TIZANIDINE HCL 4 MG PO TABS: 8.0000 mg | ORAL_TABLET | Freq: Two times a day (BID) | ORAL | 5 refills | Status: DC

## 2018-05-12 NOTE — Assessment & Plan Note (Signed)
BP well controlled Current regimen effective and well tolerated Continue current medications at current doses cmp  

## 2018-05-12 NOTE — Assessment & Plan Note (Signed)
Not currently exercising-stressed regular exercise Discussed the importance of weight loss Decrease portions and healthy diet

## 2018-05-12 NOTE — Assessment & Plan Note (Signed)
Taking amitriptyline at night Controlled Continue

## 2018-05-12 NOTE — Assessment & Plan Note (Signed)
She is 3 boils on abdomen-1 of them has opened up without drainage We will be treating her bronchitis/COPD exacerbation with doxycycline, which will also cover the boils Call if no improvement

## 2018-05-12 NOTE — Assessment & Plan Note (Signed)
Check A1c Currently not exercising-stressed regular exercise Discussed the importance of weight loss

## 2018-05-12 NOTE — Assessment & Plan Note (Signed)
Following with pain management Taking tizanidine 3 times a day-this does make her drowsy and we decided to decrease this to 2 pills twice a day Other pain management per pain management doctor

## 2018-05-12 NOTE — Assessment & Plan Note (Signed)
Check lipid panel  Continue daily statin Regular exercise and healthy diet encouraged  

## 2018-05-12 NOTE — Assessment & Plan Note (Signed)
On several medications that make her drowsy She sleeps throughout the day and states she is not sleeping well at night Tizanidine does make her drowsy and she does take that at night Will discontinue trazodone Continue 8 mg tizanidine at night, amitriptyline and gabapentin

## 2018-05-12 NOTE — Assessment & Plan Note (Signed)
Bronchitis with copd exac Has mild diffuse wheeze Prednisone taper doxycycline twice daily x10 days Continue inhalers Call if no improvement

## 2018-05-17 ENCOUNTER — Ambulatory Visit (INDEPENDENT_AMBULATORY_CARE_PROVIDER_SITE_OTHER): Payer: Medicare Other | Admitting: Orthopaedic Surgery

## 2018-05-17 ENCOUNTER — Encounter (INDEPENDENT_AMBULATORY_CARE_PROVIDER_SITE_OTHER): Payer: Self-pay | Admitting: Orthopaedic Surgery

## 2018-05-17 VITALS — BP 101/66 | HR 71 | Ht 65.5 in | Wt 310.0 lb

## 2018-05-17 DIAGNOSIS — G8929 Other chronic pain: Secondary | ICD-10-CM

## 2018-05-17 DIAGNOSIS — Z6841 Body Mass Index (BMI) 40.0 and over, adult: Secondary | ICD-10-CM | POA: Diagnosis not present

## 2018-05-17 DIAGNOSIS — M545 Low back pain, unspecified: Secondary | ICD-10-CM

## 2018-05-17 NOTE — Addendum Note (Signed)
Addended by: Meyer Cory on: 05/17/2018 09:58 AM   Modules accepted: Orders

## 2018-05-17 NOTE — Progress Notes (Addendum)
Office Visit Note   Patient: Tammy Strickland           Date of Birth: 10/18/1960           MRN: [id number] Visit Date: 05/17/2018              Requested by: Binnie Rail, MD [place], [place] [address] PCP: Binnie Rail, MD   Assessment & Plan: Visit Diagnoses:  1. Chronic bilateral low back pain without sciatica   2. Morbidly obese ([place])   3. Body mass index 50.0-59.9, adult (HCC)     Plan: Chronic low back pain.  MRI scan 2018 as listed below.  We will set her up for some physical therapy which she is not done in many years and then I will see her back in a month if she is having persistent problems will need to consider reimaging. The patient meets the AMA guidelines for Morbid (severe) obesity with a BMI > 40.0 and I have recommended weight loss. We discussed weight loss.  We will set her up for physical therapy and recheck in 1 month.  Follow-Up Instructions: Return in about 1 month (around 06/15/2018).   Orders:  No orders of the defined types were placed in this encounter.  No orders of the defined types were placed in this encounter.     Procedures: No procedures performed   Clinical Data: No additional findings.   Subjective: Chief Complaint  Patient presents with  . Lower Back - Pain  . Right Hip - Pain  . Left Hip - Pain    HPI 58 year old female with chronic low back pain since 2005.  She had an MRI scan more than 58 years old that showed mild lumbar spondylosis facet arthritis and mild neural foraminal narrowing mild L5-S1 right.  Patient states she has had more pain on the left buttocks.  She had left total knee arthroplasty by Dr. Zollie Beckers December 2019.  Patient seen pain clinic in [place] and gets Percocet 10/325 number 180 tablets monthly. Review of Systems 14 point review of system positive for fibromyalgia history of peripheral neuropathy coronary artery disease COPD.  She is not worse since 2005.  Left total knee  arthroplasty.  History of gout.  IT band syndrome left.  Objective: Vital Signs: BP 101/66   Pulse 71   Ht 5' 5.5" (1.664 m)   Wt (!) 310 lb (140.6 kg)   BMI 50.80 kg/m   Physical Exam Constitutional:      Appearance: She is well-developed.     Comments: Patient somewhat somnolent.  HENT:     Head: Normocephalic.     Right Ear: External ear normal.     Left Ear: External ear normal.  Eyes:     Pupils: Pupils are equal, round, and reactive to light.  Neck:     Thyroid: No thyromegaly.     Trachea: No tracheal deviation.  Cardiovascular:     Rate and Rhythm: Normal rate.  Pulmonary:     Effort: Pulmonary effort is normal.  Abdominal:     Palpations: Abdomen is soft.  Skin:    General: Skin is warm and dry.  Neurological:     Mental Status: She is alert and oriented to person, place, and time.  Psychiatric:        Behavior: Behavior normal.     Ortho Exam patient slow getting from sitting to standing position.  She stands for a few seconds where she begins  ambulates she does not limp slow short stride gait.  She can get on and off the exam table negative straight leg raising 90 degrees.  Anterior tib is intact right and left.  Gastrocsoleus is intact.  Knee and ankle jerk is 1+ and symmetrical.  Negative straight leg raising 90 degrees right and left.  No pain with internal or external rotation of her hips negative logroll.  Well-healed left total knee arthroplasty incision she reaches full extension flexes past 100 degrees.  Specialty Comments:  No specialty comments available.  Imaging: No results found.   PMFS History: Patient Active Problem List   Diagnosis Date Noted  . Chronic bilateral low back pain without sciatica 05/17/2018  . Acute bronchitis with COPD ([place]) 05/12/2018  . Boil 05/12/2018  . It band syndrome, left 12/29/2017  . Trochanteric bursitis, left hip 12/29/2017  . Cough 08/05/2017  . Dysphagia 04/29/2017  . History of left knee replacement  04/21/2017  . Pain in left ankle and joints of left foot 04/21/2017  . Pain in left hand 04/21/2017  . OSA (obstructive sleep apnea) 02/23/2017  . Acute gouty arthritis 12/25/2016  . Stroke ([place])   . Chronic fatigue 11/09/2016  . Hypoxia 11/09/2016  . Morbidly obese ([place]) 11/09/2016  . Rash and nonspecific skin eruption 07/21/2016  . Frequent headaches 07/21/2016  . Allergic rhinitis 07/16/2016  . Vitamin D deficiency 04/16/2016  . Hyperlipidemia 01/15/2016  . Gait disorder 12/04/2015  . Diabetes ([place]) 11/26/2015  . Chronic diastolic congestive heart failure ([place]) 11/06/2015  . TIA (transient ischemic attack)   . Carotid-cavernous fistula   . Chest pain 10/31/2015  . Insomnia 10/10/2015  . Depression 10/10/2015  . Benign essential HTN 08/15/2015  . Fibromyalgia 08/15/2015  . Peripheral neuropathy 08/15/2015  . CAD (coronary artery disease) 08/15/2015  . COPD (chronic obstructive pulmonary disease) ([place]) 08/15/2015  . Osteoporosis 08/15/2015  . 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 DDD (degenerative disc disease), lumbar 08/15/2015  . Occupational exposure to industrial toxins 08/15/2015  . GERD (gastroesophageal reflux disease) 08/15/2015   Past Medical History:  Diagnosis Date  . Anxiety   . Arthritis   . CHF (congestive heart failure) ([place])   . COPD (chronic obstructive pulmonary disease) ([place])   . Coronary artery disease   . Depression   . Diabetes mellitus without complication ([place])    type 2   . GERD (gastroesophageal reflux disease)   . Hypertension   . Lupus ([place])   . Migraines   . Neuromuscular disorder ([place])   . Osteoporosis   . Oxygen deficiency    pt uses 2.5L 02 at night   . Peripheral neuropathy   . Shingles   . Sleep apnea    had sleep study done recently ; unaware if she will be getting  a CPAP device ; patient states "im pretty sure i have it , i fall alseep all the time "  . Stroke [facility]) 10/2015    Family History  Problem Relation Age of Onset  .  Hyperlipidemia Mother   . Hypertension Mother   . Stroke Mother   . Thyroid disease Mother   . Heart attack Mother   . Hyperlipidemia Father   . Hypertension Father   . Stroke Father   . Heart attack Father   . Hypertension Sister   . Cancer Sister        breast cancer  . Stroke Sister   . Thyroid disease Sister   . Crohn's disease Sister   .  Hypertension Sister   . Hypertension Brother   . Diabetes Brother   . Hypertension Brother     Past Surgical History:  Procedure Laterality Date  . ABDOMINAL HYSTERECTOMY     ovaries left  . APPENDECTOMY    . CHOLECYSTECTOMY    . IR GENERIC HISTORICAL  11/07/2015   IR ANGIO INTRA EXTRACRAN SEL COM CAROTID INNOMINATE BILAT MOD SED 11/07/2015 Luanne Bras, MD [facility]  . IR GENERIC HISTORICAL  11/07/2015   IR ANGIO VERTEBRAL SEL SUBCLAVIAN INNOMINATE UNI R MOD SED 11/07/2015 Luanne Bras, MD [facility]  . IR GENERIC HISTORICAL  11/07/2015   IR ANGIO VERTEBRAL SEL VERTEBRAL UNI L MOD SED 11/07/2015 Luanne Bras, MD [facility]  . IR GENERIC HISTORICAL  11/07/2015   IR ANGIOGRAM EXTREMITY LEFT 11/07/2015 Luanne Bras, MD [facility]  . TONSILLECTOMY    . TOTAL KNEE ARTHROPLASTY Left 03/12/2017   Procedure: LEFT TOTAL KNEE ARTHROPLASTY;  Surgeon: Mcarthur Rossetti, MD;  Location: [facility];  Service: Orthopedics;  Laterality: Left;  Adductor Block   Social History   Occupational History  . 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 Smoking status: Current Every Day Smoker    Packs/day: 0.25    Years: 35.00    Pack years: 8.75    Types: Cigarettes  . Smokeless tobacco: Never Used  . Tobacco comment: referred  to smoking  cessation  classes. at  [facility]   Substance and Sexual Activity  . Alcohol use: No  . Drug use: No  . Sexual activity: Not Currently    Partners: Female    Birth control/protection: Surgical     Comment: hysterectomy

## 2018-05-20 ENCOUNTER — Telehealth: Payer: Self-pay

## 2018-05-20 NOTE — Telephone Encounter (Signed)
Noted  

## 2018-05-20 NOTE — Telephone Encounter (Signed)
Copied from [place] [id number]. Topic: General - Inquiry >> May 19, 2018  2:39 PM Vernona Rieger wrote: Reason for CRM: Layne with Dr Trish Fountain office with [facility] care called and said her last diabetic eye exam was in 2018 and they have been trying to reach the pt to let her know she is due for one but they had an old number. I gave her the updated number and she is going to call her.

## 2018-05-25 DIAGNOSIS — G894 Chronic pain syndrome: Secondary | ICD-10-CM | POA: Diagnosis not present

## 2018-05-25 DIAGNOSIS — Z79891 Long term (current) use of opiate analgesic: Secondary | ICD-10-CM | POA: Diagnosis not present

## 2018-05-25 DIAGNOSIS — M545 Low back pain: Secondary | ICD-10-CM | POA: Diagnosis not present

## 2018-05-25 DIAGNOSIS — M25559 Pain in unspecified hip: Secondary | ICD-10-CM | POA: Diagnosis not present

## 2018-05-25 DIAGNOSIS — M25562 Pain in left knee: Secondary | ICD-10-CM | POA: Diagnosis not present

## 2018-05-26 ENCOUNTER — Encounter (HOSPITAL_COMMUNITY): Payer: Self-pay | Admitting: Emergency Medicine

## 2018-05-26 ENCOUNTER — Other Ambulatory Visit: Payer: Self-pay | Admitting: Internal Medicine

## 2018-05-26 ENCOUNTER — Ambulatory Visit (HOSPITAL_COMMUNITY)
Admission: EM | Admit: 2018-05-26 | Discharge: 2018-05-26 | Disposition: A | Discharge status: Home / Self Care | Payer: Medicare Other | Attending: Family Medicine | Admitting: Family Medicine

## 2018-05-26 DIAGNOSIS — J4520 Mild intermittent asthma, uncomplicated: Secondary | ICD-10-CM | POA: Diagnosis not present

## 2018-05-26 DIAGNOSIS — L0291 Cutaneous abscess, unspecified: Secondary | ICD-10-CM

## 2018-05-26 DIAGNOSIS — R062 Wheezing: Secondary | ICD-10-CM | POA: Diagnosis not present

## 2018-05-26 DIAGNOSIS — J441 Chronic obstructive pulmonary disease with (acute) exacerbation: Secondary | ICD-10-CM | POA: Diagnosis not present

## 2018-05-26 DIAGNOSIS — M5136 Other intervertebral disc degeneration, lumbar region: Secondary | ICD-10-CM

## 2018-05-26 DIAGNOSIS — E119 Type 2 diabetes mellitus without complications: Secondary | ICD-10-CM | POA: Diagnosis not present

## 2018-05-26 LAB — HM DIABETES EYE EXAM

## 2018-05-26 MED ORDER — TIZANIDINE HCL 4 MG PO TABS: ORAL_TABLET | ORAL | 1 refills | Status: DC

## 2018-05-26 MED ORDER — IPRATROPIUM-ALBUTEROL 0.5-2.5 (3) MG/3ML IN SOLN: 3.0000 mL | RESPIRATORY_TRACT | 0 refills | Status: DC | PRN

## 2018-05-26 MED ORDER — SULFAMETHOXAZOLE-TRIMETHOPRIM 800-160 MG PO TABS: 1.0000 | ORAL_TABLET | Freq: Two times a day (BID) | ORAL | 0 refills | Status: AC

## 2018-05-26 MED ORDER — IPRATROPIUM-ALBUTEROL 0.5-2.5 (3) MG/3ML IN SOLN
3.0000 mL | Freq: Once | RESPIRATORY_TRACT | Status: AC
  Administered 2018-05-26: 3 mL via RESPIRATORY_TRACT

## 2018-05-26 MED ORDER — IPRATROPIUM-ALBUTEROL 0.5-2.5 (3) MG/3ML IN SOLN
RESPIRATORY_TRACT | Status: AC
  Filled 2018-05-26: qty 3

## 2018-05-26 MED ORDER — PREDNISONE 20 MG PO TABS: 20.0000 mg | ORAL_TABLET | Freq: Two times a day (BID) | ORAL | 0 refills | Status: DC

## 2018-05-26 NOTE — Discharge Instructions (Signed)
Drink plenty of fluids Use warm compresses to the infection in your groin  Continue using a humidifier in the bedroom Use nebulizer for breathing treatments every 4 hours while awake as long as you are wheezing Continue Symbicort twice a day Take Septra 2 times a day for 7 days Take prednisone 2 times a day for 5 days Follow-up with your primary care doctor

## 2018-05-26 NOTE — ED Notes (Signed)
Faxed information for nebulizer to [employer]

## 2018-05-26 NOTE — ED Provider Notes (Signed)
[place]    CSN: [id number] Arrival date & time: 05/26/18  1023     History   Chief Complaint Chief Complaint  Patient presents with  . URI    HPI Tammy Strickland is a 58 y.o. female.   HPI  Two complaints First is another exacerbation of her COPD with cough an yellow sputum and wheeze,  Worsening for 4 days   No fever.  NO NVD.  No chest pain or pain with inspiration.  Took doxycycline an d prednisone for recent exacerbation on 05/12/2018 Recurrent boils.  One now in the right groin crease that is painful   Past Medical History:  Diagnosis Date  . Anxiety   . Arthritis   . CHF (congestive heart failure) ([place])   . COPD (chronic obstructive pulmonary disease) ([place])   . Coronary artery disease   . Depression   . Diabetes mellitus without complication ([place])    type 2   . GERD (gastroesophageal reflux disease)   . Hypertension   . Lupus ([place])   . Migraines   . Neuromuscular disorder ([place])   . Osteoporosis   . Oxygen deficiency    pt uses 2.5L 02 at night   . Peripheral neuropathy   . Shingles   . Sleep apnea    had sleep study done recently ; unaware if she will be getting  a CPAP device ; patient states "im pretty sure i have it , i fall alseep all the time "  . Stroke [facility]) 10/2015    Patient Active Problem List   Diagnosis Date Noted  . Chronic bilateral low back pain without sciatica 05/17/2018  . Body mass index 50.0-59.9, adult ([place]) 05/17/2018  . Acute bronchitis with COPD ([place]) 05/12/2018  . Boil 05/12/2018  . It band syndrome, left 12/29/2017  . Trochanteric bursitis, left hip 12/29/2017  . Cough 08/05/2017  . Dysphagia 04/29/2017  . History of left knee replacement 04/21/2017  . Pain in left ankle and joints of left foot 04/21/2017  . Pain in left hand 04/21/2017  . OSA (obstructive sleep apnea) 02/23/2017  . Acute gouty arthritis 12/25/2016  . Stroke ([place])   . Chronic fatigue 11/09/2016  . Hypoxia 11/09/2016  . Morbidly obese  ([place]) 11/09/2016  . Rash and nonspecific skin eruption 07/21/2016  . Frequent headaches 07/21/2016  . Allergic rhinitis 07/16/2016  . Vitamin D deficiency 04/16/2016  . Hyperlipidemia 01/15/2016  . Gait disorder 12/04/2015  . Diabetes ([place]) 11/26/2015  . Chronic diastolic congestive heart failure ([place]) 11/06/2015  . TIA (transient ischemic attack)   . Carotid-cavernous fistula   . Chest pain 10/31/2015  . Insomnia 10/10/2015  . Depression 10/10/2015  . Benign essential HTN 08/15/2015  . Fibromyalgia 08/15/2015  . Peripheral neuropathy 08/15/2015  . CAD (coronary artery disease) 08/15/2015  . COPD (chronic obstructive pulmonary disease) ([place]) 08/15/2015  . Osteoporosis 08/15/2015  . 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 DDD (degenerative disc disease), lumbar 08/15/2015  . Occupational exposure to industrial toxins 08/15/2015  . GERD (gastroesophageal reflux disease) 08/15/2015    Past Surgical History:  Procedure Laterality Date  . ABDOMINAL HYSTERECTOMY     ovaries left  . APPENDECTOMY    . CHOLECYSTECTOMY    . IR GENERIC HISTORICAL  11/07/2015   IR ANGIO INTRA EXTRACRAN SEL COM CAROTID INNOMINATE BILAT MOD SED 11/07/2015 Luanne Bras, MD [facility]  . IR GENERIC HISTORICAL  11/07/2015   IR ANGIO VERTEBRAL SEL SUBCLAVIAN INNOMINATE UNI R MOD SED 11/07/2015  Luanne Bras, MD [facility]  . IR GENERIC HISTORICAL  11/07/2015   IR ANGIO VERTEBRAL SEL VERTEBRAL UNI L MOD SED 11/07/2015 Luanne Bras, MD [facility]  . IR GENERIC HISTORICAL  11/07/2015   IR ANGIOGRAM EXTREMITY LEFT 11/07/2015 Luanne Bras, MD [facility]  . TONSILLECTOMY    . TOTAL KNEE ARTHROPLASTY Left 03/12/2017   Procedure: LEFT TOTAL KNEE ARTHROPLASTY;  Surgeon: Mcarthur Rossetti, MD;  Location: [facility];  Service: Orthopedics;  Laterality: Left;  Adductor Block    OB History    Gravida  3   Para      Term      Preterm      AB      Living  3     SAB      TAB      Ectopic       Multiple      Live Births               Home Medications    Prior to Admission medications   Medication Sig Start Date End Date Taking? Authorizing Provider  amitriptyline (ELAVIL) 50 MG tablet Take 1 tablet by mouth at bedtime 04/21/18  Yes Burns, Claudina Lick, MD  aspirin EC 81 MG tablet Take 1 tablet (81 mg total) by mouth daily. 11/01/15  Yes Elgergawy, Silver Huguenin, MD  atorvastatin (LIPITOR) 20 MG tablet TAKE 1 TABLET(20 MG) BY MOUTH DAILY 05/11/18  Yes Burns, Claudina Lick, MD  cloNIDine (CATAPRES) 0.3 MG tablet Take 1 tablet (0.3 mg total) by mouth daily. 07/07/17  Yes Burns, Claudina Lick, MD  fluticasone (FLONASE) 50 MCG/ACT nasal spray Shake liquid & use 1 spray into each nostril every day as needed for allergies 03/21/18  Yes Burns, Claudina Lick, MD  furosemide (LASIX) 80 MG tablet Take 1 tablet (80 mg total) by mouth daily. 07/07/17  Yes Burns, Claudina Lick, MD  gabapentin (NEURONTIN) 600 MG tablet Take 2 tablets by mouth 3 times a day 04/20/18  Yes Burns, Claudina Lick, MD  glucose blood test strip Use as instructed to check sugar three times a day 01/28/17  Yes Burns, Claudina Lick, MD  Lancets MISC Use as directed twice per day 01/28/17  Yes Burns, Claudina Lick, MD  losartan (COZAAR) 50 MG tablet Take 1 tablet (50 mg total) by mouth 2 (two) times daily. 07/07/17  Yes Burns, Claudina Lick, MD  metFORMIN (GLUCOPHAGE-XR) 500 MG 24 hr tablet Take 2 tablets by mouth twice daily with a meal 04/20/18  Yes Burns, Claudina Lick, MD  metoprolol (TOPROL-XL) 200 MG 24 hr tablet Take 1 tablet (200 mg total) by mouth 2 (two) times daily. 07/07/17  Yes Burns, Claudina Lick, MD  mometasone (NASONEX) 50 MCG/ACT nasal spray Place 2 sprays into the nose daily. 01/04/18  Yes Burns, Claudina Lick, MD  omeprazole (PRILOSEC) 40 MG capsule Take 1 capsule (40 mg total) by mouth daily. 09/21/17  Yes Burns, Claudina Lick, MD  oxyCODONE-acetaminophen (PERCOCET) 10-325 MG tablet TK 1 T PO Q 4 H PRF PAIN 01/31/18  Yes [provider]  potassium chloride SA (K-DUR,KLOR-CON) 20 MEQ  tablet Take 1 tablet (20 mEq total) by mouth daily. 07/07/17  Yes Binnie Rail, MD  SYMBICORT 80-4.5 MCG/ACT inhaler Inhale 2 Puffs by mouth twice daily 12/28/17  Yes Burns, Claudina Lick, MD  ipratropium-albuterol (DUONEB) 0.5-2.5 (3) MG/3ML SOLN Take 3 mLs by nebulization every 4 (four) hours as needed. 05/26/18   Raylene Everts, MD  nitroGLYCERIN (NITROSTAT) 0.4  MG SL tablet Place 0.4 mg under the tongue every 5 (five) minutes as needed for chest pain.    [provider]  predniSONE (DELTASONE) 20 MG tablet Take 1 tablet (20 mg total) by mouth 2 (two) times daily with a meal. 05/26/18   Raylene Everts, MD  sulfamethoxazole-trimethoprim (BACTRIM DS,SEPTRA DS) 800-160 MG tablet Take 1 tablet by mouth 2 (two) times daily for 7 days. 05/26/18 06/02/18  Raylene Everts, MD  tiZANidine (ZANAFLEX) 4 MG tablet TAKE FOUR TABLETS BY MOUTH TWICE DAILY 05/26/18   Binnie Rail, MD    Family History Family History  Problem Relation Age of Onset  . Hyperlipidemia Mother   . Hypertension Mother   . Stroke Mother   . Thyroid disease Mother   . Heart attack Mother   . Hyperlipidemia Father   . Hypertension Father   . Stroke Father   . Heart attack Father   . Hypertension Sister   . Cancer Sister        breast cancer  . Stroke Sister   . Thyroid disease Sister   . Crohn's disease Sister   . Hypertension Sister   . Hypertension Brother   . Diabetes Brother   . Hypertension Brother     Social History Social History   Tobacco Use  . Smoking status: Current Every Day Smoker    Packs/day: 0.25    Years: 35.00    Pack years: 8.75    Types: Cigarettes  . Smokeless tobacco: Never Used  . Tobacco comment: referred  to smoking  cessation  classes. at  [facility]   Substance Use Topics  . Alcohol use: No  . Drug use: No     Allergies   Bacitracin   Review of Systems Review of Systems  Constitutional: Negative for chills, fatigue and fever.  HENT: Positive for congestion. Negative  for ear pain and sore throat.   Eyes: Negative for pain and visual disturbance.  Respiratory: Positive for cough, shortness of breath and wheezing.   Cardiovascular: Negative for chest pain and palpitations.  Gastrointestinal: Negative for abdominal pain and vomiting.  Genitourinary: Negative for dysuria and hematuria.  Musculoskeletal: Negative for arthralgias and back pain.  Skin: Positive for wound. Negative for color change and rash.  Neurological: Negative for seizures and syncope.  All other systems reviewed and are negative.    Physical Exam Triage Vital Signs ED Triage Vitals  Enc Vitals Group     BP 05/26/18 1044 110/76     Pulse Rate 05/26/18 1044 66     Resp 05/26/18 1044 20     Temp 05/26/18 1044 97.9 F (36.6 C)     Temp Source 05/26/18 1044 Oral     SpO2 05/26/18 1044 98 %     Weight --      Height --      Head Circumference --      Peak Flow --      Pain Score 05/26/18 1041 9     Pain Loc --      Pain Edu? --      Excl. in [place]? --    No data found.  Updated Vital Signs BP 110/76 (BP Location: Right Arm)   Pulse 66   Temp 97.9 F (36.6 C) (Oral)   Resp 20   SpO2 98%      Physical Exam Constitutional:      General: She is not in acute distress.    Appearance: She is well-developed.  HENT:     Head: Normocephalic and atraumatic.     Right Ear: Tympanic membrane and ear canal normal.     Left Ear: Tympanic membrane and ear canal normal.     Nose: Nose normal.     Mouth/Throat:     Pharynx: No posterior oropharyngeal erythema.  Eyes:     Conjunctiva/sclera: Conjunctivae normal.     Pupils: Pupils are equal, round, and reactive to light.  Neck:     Musculoskeletal: Normal range of motion.  Cardiovascular:     Rate and Rhythm: Normal rate and regular rhythm.     Heart sounds: Normal heart sounds.  Pulmonary:     Effort: Pulmonary effort is normal. No respiratory distress.     Breath sounds: Wheezing and rhonchi present.  Abdominal:      General: There is no distension.     Palpations: Abdomen is soft.  Musculoskeletal: Normal range of motion.  Skin:    General: Skin is warm and dry.     Comments: 1 cm papule with punctate opening draining scant purulence. Underlying induration 2-3 cm.  Tender.  anesth with 2cc of  Lidocaine.  Aspiration with 18 ga unsuccessful  X 2.  The surface drained scant purulence  Neurological:     Mental Status: She is alert.  Psychiatric:        Mood and Affect: Mood normal.        Thought Content: Thought content normal.      UC Treatments / Results  Labs (all labs ordered are listed, but only abnormal results are displayed) Labs Reviewed - No data to display  EKG None  Radiology No results found.  Procedures Procedures (including critical care time)  Medications Ordered in UC Medications  ipratropium-albuterol (DUONEB) 0.5-2.5 (3) MG/3ML nebulizer solution 3 mL (3 mLs Nebulization Given 05/26/18 1241)    Initial Impression / Assessment and Plan / UC Course  I have reviewed the triage vital signs and the nursing notes.  Pertinent labs & imaging results that were available during my care of the patient were reviewed by me and considered in my medical decision making (see chart for details).      Final Clinical Impressions(s) / UC Diagnoses   Final diagnoses:  COPD exacerbation ([place])  Abscess     Discharge Instructions     Drink plenty of fluids Use warm compresses to the infection in your groin  Continue using a humidifier in the bedroom Use nebulizer for breathing treatments every 4 hours while awake as long as you are wheezing Continue Symbicort twice a day Take Septra 2 times a day for 7 days Take prednisone 2 times a day for 5 days Follow-up with your primary care doctor    ED Prescriptions    Medication Sig Dispense Auth. Provider   sulfamethoxazole-trimethoprim (BACTRIM DS,SEPTRA DS) 800-160 MG tablet Take 1 tablet by mouth 2 (two) times daily for 7  days. 14 tablet Raylene Everts, MD   predniSONE (DELTASONE) 20 MG tablet Take 1 tablet (20 mg total) by mouth 2 (two) times daily with a meal. 10 tablet Raylene Everts, MD   ipratropium-albuterol (DUONEB) 0.5-2.5 (3) MG/3ML SOLN Take 3 mLs by nebulization every 4 (four) hours as needed. 120 mL Raylene Everts, MD     Controlled Substance Prescriptions  Controlled Substance Registry consulted? Not Applicable   Raylene Everts, MD 05/26/18 [phone]

## 2018-05-26 NOTE — ED Triage Notes (Signed)
PT C/O: cold sx onset 4 days and also reports abscess on groin x3 days   Sx today include: fevers, SOB  Reports she came in yest to be seen but left d/t wait time.   Also reports she was seen here 2 weeks ago for similar sx and rec'd a pack of prednisone.   TAKING MEDS: Nyquil   A&O x4... NAD... Ambulatory

## 2018-05-27 ENCOUNTER — Other Ambulatory Visit: Payer: Self-pay | Admitting: Internal Medicine

## 2018-05-31 ENCOUNTER — Ambulatory Visit: Payer: Medicare Other | Attending: Orthopaedic Surgery | Admitting: Physical Therapy

## 2018-06-01 DIAGNOSIS — H5213 Myopia, bilateral: Secondary | ICD-10-CM | POA: Diagnosis not present

## 2018-06-14 ENCOUNTER — Other Ambulatory Visit: Payer: Self-pay

## 2018-06-14 ENCOUNTER — Inpatient Hospital Stay (HOSPITAL_COMMUNITY)
Admission: EM | Admit: 2018-06-14 | Discharge: 2018-06-17 | DRG: 641 | Disposition: A | Discharge status: Home / Self Care | Payer: Medicare Other | Attending: Internal Medicine | Admitting: Internal Medicine

## 2018-06-14 ENCOUNTER — Encounter: Payer: Self-pay | Admitting: Internal Medicine

## 2018-06-14 ENCOUNTER — Encounter (HOSPITAL_COMMUNITY): Payer: Self-pay | Admitting: Emergency Medicine

## 2018-06-14 ENCOUNTER — Encounter (HOSPITAL_COMMUNITY): Payer: Self-pay

## 2018-06-14 ENCOUNTER — Ambulatory Visit (INDEPENDENT_AMBULATORY_CARE_PROVIDER_SITE_OTHER)
Admission: EM | Admit: 2018-06-14 | Discharge: 2018-06-14 | Disposition: A | Discharge status: Home / Self Care | Payer: Medicare Other | Source: Home / Self Care | Attending: Emergency Medicine | Admitting: Emergency Medicine

## 2018-06-14 DIAGNOSIS — K760 Fatty (change of) liver, not elsewhere classified: Secondary | ICD-10-CM | POA: Diagnosis present

## 2018-06-14 DIAGNOSIS — I252 Old myocardial infarction: Secondary | ICD-10-CM | POA: Diagnosis not present

## 2018-06-14 DIAGNOSIS — Z803 Family history of malignant neoplasm of breast: Secondary | ICD-10-CM

## 2018-06-14 DIAGNOSIS — Z7984 Long term (current) use of oral hypoglycemic drugs: Secondary | ICD-10-CM

## 2018-06-14 DIAGNOSIS — R1013 Epigastric pain: Secondary | ICD-10-CM | POA: Diagnosis present

## 2018-06-14 DIAGNOSIS — I10 Essential (primary) hypertension: Secondary | ICD-10-CM | POA: Diagnosis not present

## 2018-06-14 DIAGNOSIS — Z7951 Long term (current) use of inhaled steroids: Secondary | ICD-10-CM

## 2018-06-14 DIAGNOSIS — E860 Dehydration: Principal | ICD-10-CM | POA: Diagnosis present

## 2018-06-14 DIAGNOSIS — R740 Nonspecific elevation of levels of transaminase and lactic acid dehydrogenase [LDH]: Secondary | ICD-10-CM | POA: Diagnosis not present

## 2018-06-14 DIAGNOSIS — F419 Anxiety disorder, unspecified: Secondary | ICD-10-CM | POA: Diagnosis present

## 2018-06-14 DIAGNOSIS — R079 Chest pain, unspecified: Secondary | ICD-10-CM | POA: Diagnosis not present

## 2018-06-14 DIAGNOSIS — E1142 Type 2 diabetes mellitus with diabetic polyneuropathy: Secondary | ICD-10-CM

## 2018-06-14 DIAGNOSIS — Z8673 Personal history of transient ischemic attack (TIA), and cerebral infarction without residual deficits: Secondary | ICD-10-CM

## 2018-06-14 DIAGNOSIS — F329 Major depressive disorder, single episode, unspecified: Secondary | ICD-10-CM | POA: Diagnosis present

## 2018-06-14 DIAGNOSIS — R1011 Right upper quadrant pain: Secondary | ICD-10-CM | POA: Diagnosis not present

## 2018-06-14 DIAGNOSIS — K219 Gastro-esophageal reflux disease without esophagitis: Secondary | ICD-10-CM | POA: Diagnosis not present

## 2018-06-14 DIAGNOSIS — E1149 Type 2 diabetes mellitus with other diabetic neurological complication: Secondary | ICD-10-CM

## 2018-06-14 DIAGNOSIS — I110 Hypertensive heart disease with heart failure: Secondary | ICD-10-CM | POA: Diagnosis not present

## 2018-06-14 DIAGNOSIS — E872 Acidosis, unspecified: Secondary | ICD-10-CM | POA: Diagnosis present

## 2018-06-14 DIAGNOSIS — J449 Chronic obstructive pulmonary disease, unspecified: Secondary | ICD-10-CM | POA: Diagnosis present

## 2018-06-14 DIAGNOSIS — I152 Hypertension secondary to endocrine disorders: Secondary | ICD-10-CM | POA: Diagnosis present

## 2018-06-14 DIAGNOSIS — R7989 Other specified abnormal findings of blood chemistry: Secondary | ICD-10-CM

## 2018-06-14 DIAGNOSIS — Z823 Family history of stroke: Secondary | ICD-10-CM

## 2018-06-14 DIAGNOSIS — E119 Type 2 diabetes mellitus without complications: Secondary | ICD-10-CM | POA: Diagnosis not present

## 2018-06-14 DIAGNOSIS — Z90710 Acquired absence of both cervix and uterus: Secondary | ICD-10-CM

## 2018-06-14 DIAGNOSIS — Z96652 Presence of left artificial knee joint: Secondary | ICD-10-CM | POA: Diagnosis present

## 2018-06-14 DIAGNOSIS — I5032 Chronic diastolic (congestive) heart failure: Secondary | ICD-10-CM | POA: Diagnosis present

## 2018-06-14 DIAGNOSIS — F17210 Nicotine dependence, cigarettes, uncomplicated: Secondary | ICD-10-CM | POA: Diagnosis present

## 2018-06-14 DIAGNOSIS — Z7982 Long term (current) use of aspirin: Secondary | ICD-10-CM | POA: Diagnosis not present

## 2018-06-14 DIAGNOSIS — E0840 Diabetes mellitus due to underlying condition with diabetic neuropathy, unspecified: Secondary | ICD-10-CM

## 2018-06-14 DIAGNOSIS — Z8249 Family history of ischemic heart disease and other diseases of the circulatory system: Secondary | ICD-10-CM

## 2018-06-14 DIAGNOSIS — Z79899 Other long term (current) drug therapy: Secondary | ICD-10-CM

## 2018-06-14 DIAGNOSIS — M810 Age-related osteoporosis without current pathological fracture: Secondary | ICD-10-CM | POA: Diagnosis not present

## 2018-06-14 DIAGNOSIS — Z9049 Acquired absence of other specified parts of digestive tract: Secondary | ICD-10-CM

## 2018-06-14 DIAGNOSIS — I2510 Atherosclerotic heart disease of native coronary artery without angina pectoris: Secondary | ICD-10-CM | POA: Diagnosis not present

## 2018-06-14 DIAGNOSIS — Z7952 Long term (current) use of systemic steroids: Secondary | ICD-10-CM

## 2018-06-14 DIAGNOSIS — R945 Abnormal results of liver function studies: Secondary | ICD-10-CM

## 2018-06-14 DIAGNOSIS — R109 Unspecified abdominal pain: Secondary | ICD-10-CM | POA: Diagnosis not present

## 2018-06-14 DIAGNOSIS — R7401 Elevation of levels of liver transaminase levels: Secondary | ICD-10-CM | POA: Diagnosis present

## 2018-06-14 DIAGNOSIS — K3184 Gastroparesis: Secondary | ICD-10-CM | POA: Diagnosis present

## 2018-06-14 DIAGNOSIS — Z8349 Family history of other endocrine, nutritional and metabolic diseases: Secondary | ICD-10-CM

## 2018-06-14 DIAGNOSIS — Z833 Family history of diabetes mellitus: Secondary | ICD-10-CM

## 2018-06-14 DIAGNOSIS — E1143 Type 2 diabetes mellitus with diabetic autonomic (poly)neuropathy: Secondary | ICD-10-CM | POA: Diagnosis present

## 2018-06-14 DIAGNOSIS — Z6841 Body Mass Index (BMI) 40.0 and over, adult: Secondary | ICD-10-CM

## 2018-06-14 DIAGNOSIS — G629 Polyneuropathy, unspecified: Secondary | ICD-10-CM | POA: Diagnosis not present

## 2018-06-14 DIAGNOSIS — Z79891 Long term (current) use of opiate analgesic: Secondary | ICD-10-CM

## 2018-06-14 LAB — COMPREHENSIVE METABOLIC PANEL
ALT: 65 U/L — ABNORMAL HIGH (ref 0–44)
AST: 78 U/L — ABNORMAL HIGH (ref 15–41)
Albumin: 3.6 g/dL (ref 3.5–5.0)
Alkaline Phosphatase: 136 U/L — ABNORMAL HIGH (ref 38–126)
Anion gap: 16 — ABNORMAL HIGH (ref 5–15)
BUN: 7 mg/dL (ref 6–20)
CO2: 24 mmol/L (ref 22–32)
Calcium: 8.4 mg/dL — ABNORMAL LOW (ref 8.9–10.3)
Chloride: 97 mmol/L — ABNORMAL LOW (ref 98–111)
Creatinine, Ser: 0.91 mg/dL (ref 0.44–1.00)
GFR calc Af Amer: >60 mL/min (ref >60)
GFR calc non Af Amer: >60 mL/min (ref >60)
Glucose, Bld: 226 mg/dL — ABNORMAL HIGH (ref 70–99)
Potassium: 3.6 mmol/L (ref 3.5–5.1)
Sodium: 137 mmol/L (ref 135–145)
Total Bilirubin: 0.6 mg/dL (ref 0.3–1.2)
Total Protein: 7.2 g/dL (ref 6.5–8.1)

## 2018-06-14 LAB — CBC
HCT: 41.5 % (ref 36.0–46.0)
Hemoglobin: 13.1 g/dL (ref 12.0–15.0)
MCH: 26.9 pg (ref 26.0–34.0)
MCHC: 31.6 g/dL (ref 30.0–36.0)
MCV: 85.2 fL (ref 80.0–100.0)
Platelets: 277 10*3/uL (ref 150–400)
RBC: 4.87 MIL/uL (ref 3.87–5.11)
RDW: 15.1 % (ref 11.5–15.5)
WBC: 8.7 10*3/uL (ref 4.0–10.5)
nRBC: 0 % (ref 0.0–0.2)

## 2018-06-14 LAB — LIPASE, BLOOD: Lipase: 18 U/L (ref 11–51)

## 2018-06-14 MED ORDER — SODIUM CHLORIDE 0.9% FLUSH: 3.0000 mL | Freq: Once | INTRAVENOUS | Status: DC

## 2018-06-14 MED ORDER — SODIUM CHLORIDE 0.9 % IV BOLUS
1000.0000 mL | Freq: Once | INTRAVENOUS | Status: AC
  Administered 2018-06-15: 1000 mL via INTRAVENOUS

## 2018-06-14 NOTE — ED Triage Notes (Signed)
Pt reports RUQ pain since Saturday. Pt has had gallbladder removed. Also reports some n.v. no diarrhea. Pt a.o, nad noted

## 2018-06-14 NOTE — Discharge Instructions (Addendum)
Do not have anything to eat or drink until your ER evaluation is complete. Let them know if your abdominal pain changes or gets worse.

## 2018-06-14 NOTE — ED Provider Notes (Signed)
HPI  SUBJECTIVE:  Tammy Strickland is a 58 y.o. female who presents with constant nonmigratory, nonradiating burning, stabbing, sore right upper quadrant pain for the past 3 days.  She states it got worse today.  She reports a cough productive of thick yellowish-white phlegm and 2 episodes of posttussive emesis.  She reports shortness of breath, dyspnea on exertion.  No nausea, vomiting, fevers, chest pain, wheezing, hemoptysis.  No new or different back pain.  No urinary complaints.  No abdominal distention.  She reports 6 pound weight gain over the past few days and lower extremity edema.  No nocturia, orthopnea, PND.  She states that the car ride over here was painful.  She tried Percocet without improvement in her symptoms.  She states symptoms are worse with coughing, bending over, walking.  Is not associated with eating or drinking.  She has an extensive past medical history including laparoscopic cholecystectomy, status post appendectomy, hysterectomy, diabetes, hypertension, stroke, GERD, COPD, CHF, lupus, coronary artery disease, hypercholesterolemia, smoking and chronic back pain.  No history of nephrolithiasis, PE, DVT, cirrhosis, pancreatitis.  She drinks 2 to 3 glasses of wine on the weekends.  Denies recent alcohol use.  PMD: Binnie Rail, MD   Past Medical History:  Diagnosis Date  . Anxiety   . Arthritis   . CHF (congestive heart failure) ([place])   . COPD (chronic obstructive pulmonary disease) ([place])   . Coronary artery disease   . Depression   . Diabetes mellitus without complication ([place])    type 2   . GERD (gastroesophageal reflux disease)   . Hypertension   . Lupus ([place])   . Migraines   . Neuromuscular disorder ([place])   . Osteoporosis   . Oxygen deficiency    pt uses 2.5L 02 at night   . Peripheral neuropathy   . Shingles   . Sleep apnea    had sleep study done recently ; unaware if she will be getting  a CPAP device ; patient states "im pretty sure i have it , i fall  alseep all the time "  . Stroke [facility]) 10/2015    Past Surgical History:  Procedure Laterality Date  . ABDOMINAL HYSTERECTOMY     ovaries left  . APPENDECTOMY    . CHOLECYSTECTOMY    . IR GENERIC HISTORICAL  11/07/2015   IR ANGIO INTRA EXTRACRAN SEL COM CAROTID INNOMINATE BILAT MOD SED 11/07/2015 Luanne Bras, MD [facility]  . IR GENERIC HISTORICAL  11/07/2015   IR ANGIO VERTEBRAL SEL SUBCLAVIAN INNOMINATE UNI R MOD SED 11/07/2015 Luanne Bras, MD [facility]  . IR GENERIC HISTORICAL  11/07/2015   IR ANGIO VERTEBRAL SEL VERTEBRAL UNI L MOD SED 11/07/2015 Luanne Bras, MD [facility]  . IR GENERIC HISTORICAL  11/07/2015   IR ANGIOGRAM EXTREMITY LEFT 11/07/2015 Luanne Bras, MD [facility]  . TONSILLECTOMY    . TOTAL KNEE ARTHROPLASTY Left 03/12/2017   Procedure: LEFT TOTAL KNEE ARTHROPLASTY;  Surgeon: Mcarthur Rossetti, MD;  Location: [facility];  Service: Orthopedics;  Laterality: Left;  Adductor Block    Family History  Problem Relation Age of Onset  . Hyperlipidemia Mother   . Hypertension Mother   . Stroke Mother   . Thyroid disease Mother   . Heart attack Mother   . Hyperlipidemia Father   . Hypertension Father   . Stroke Father   . Heart attack Father   . Hypertension Sister   . Cancer Sister  breast cancer  . Stroke Sister   . Thyroid disease Sister   . Crohn's disease Sister   . Hypertension Sister   . Hypertension Brother   . Diabetes Brother   . Hypertension Brother     Social History   Tobacco Use  . Smoking status: Current Every Day Smoker    Packs/day: 0.25    Years: 35.00    Pack years: 8.75    Types: Cigarettes  . Smokeless tobacco: Never Used  . Tobacco comment: referred  to smoking  cessation  classes. at  [facility]   Substance Use Topics  . Alcohol use: No  . Drug use: No    No current facility-administered medications for this encounter.   Current Outpatient Medications:  .  amitriptyline (ELAVIL) 50 MG tablet,  Take 1 tablet by mouth at bedtime, Disp: 30 tablet, Rfl: 3 .  aspirin EC 81 MG tablet, Take 1 tablet (81 mg total) by mouth daily., Disp: , Rfl:  .  atorvastatin (LIPITOR) 20 MG tablet, TAKE 1 TABLET(20 MG) BY MOUTH DAILY, Disp: 90 tablet, Rfl: 1 .  cloNIDine (CATAPRES) 0.3 MG tablet, Take 1 tablet (0.3 mg total) by mouth daily., Disp: 90 tablet, Rfl: 3 .  Dulaglutide (TRULICITY) 1.5 KN/3.9JQ SOPN, Inject 1.5mg  subcutaneously every Sunday, Disp: 6 pen, Rfl: 1 .  fluticasone (FLONASE) 50 MCG/ACT nasal spray, Shake liquid & use 1 spray into each nostril every day as needed for allergies, Disp: 16 g, Rfl: 2 .  furosemide (LASIX) 80 MG tablet, Take 1 tablet (80 mg total) by mouth daily., Disp: 90 tablet, Rfl: 3 .  gabapentin (NEURONTIN) 600 MG tablet, Take 2 tablets by mouth 3 times a day, Disp: 180 tablet, Rfl: 3 .  glucose blood test strip, Use as instructed to check sugar three times a day, Disp: 200 each, Rfl: 5 .  ipratropium-albuterol (DUONEB) 0.5-2.5 (3) MG/3ML SOLN, Take 3 mLs by nebulization every 4 (four) hours as needed., Disp: 120 mL, Rfl: 0 .  Lancets MISC, Use as directed twice per day, Disp: 200 each, Rfl: 5 .  losartan (COZAAR) 50 MG tablet, Take 1 tablet (50 mg total) by mouth 2 (two) times daily., Disp: 180 tablet, Rfl: 3 .  metFORMIN (GLUCOPHAGE-XR) 500 MG 24 hr tablet, Take 2 tablets by mouth twice daily with a meal, Disp: 120 tablet, Rfl: 3 .  metoprolol (TOPROL-XL) 200 MG 24 hr tablet, Take 1 tablet (200 mg total) by mouth 2 (two) times daily., Disp: 180 tablet, Rfl: 3 .  mometasone (NASONEX) 50 MCG/ACT nasal spray, Place 2 sprays into the nose daily., Disp: 51 g, Rfl: 1 .  omeprazole (PRILOSEC) 40 MG capsule, Take 1 capsule (40 mg total) by mouth daily., Disp: 90 capsule, Rfl: 3 .  oxyCODONE-acetaminophen (PERCOCET) 10-325 MG tablet, TK 1 T PO Q 4 H PRF PAIN, Disp: , Rfl: 0 .  potassium chloride SA (K-DUR,KLOR-CON) 20 MEQ tablet, Take 1 tablet (20 mEq total) by mouth daily., Disp:  90 tablet, Rfl: 3 .  predniSONE (DELTASONE) 20 MG tablet, Take 1 tablet (20 mg total) by mouth 2 (two) times daily with a meal., Disp: 10 tablet, Rfl: 0 .  SYMBICORT 80-4.5 MCG/ACT inhaler, Inhale 2 Puffs by mouth twice daily, Disp: 1 Inhaler, Rfl: 3 .  tiZANidine (ZANAFLEX) 4 MG tablet, TAKE FOUR TABLETS BY MOUTH TWICE DAILY, Disp: 240 tablet, Rfl: 1 .  nitroGLYCERIN (NITROSTAT) 0.4 MG SL tablet, Place 0.4 mg under the tongue every 5 (five) minutes as needed for  chest pain., Disp: , Rfl:   Allergies  Allergen Reactions  . Bacitracin Hives     ROS  As noted in HPI.   Physical Exam  BP (!) 133/95 (BP Location: Right Wrist)   Pulse 79   Temp 98.3 F (36.8 C) (Oral)   Resp (!) 24   SpO2 94%   Constitutional: Well developed, well nourished, appears slightly uncomfortable Eyes:  EOMI, conjunctiva normal bilaterally HENT: Normocephalic, atraumatic,mucus membranes moist Respiratory: Normal inspiratory effort, lungs clear bilaterally, fair air movement. Cardiovascular: Normal rate regular rhythm, no murmurs rubs or gallops GI: nondistended, soft.  Positive right upper quadrant tenderness with voluntary guarding.  No rebound.  Negative Murphy.  No other abdominal tenderness.  Hypoactive bowel sounds.  No rebound.  Positive tap table test.   Back: No CVAT  skin: No rash, skin intact Musculoskeletal: no deformities symmetric, no posterior tenderness or palpable cord.  Mild bilateral trace edema. Neurologic: Alert & oriented x 3, no focal neuro deficits Psychiatric: Speech and behavior appropriate   ED Course   Medications - No data to display  No orders of the defined types were placed in this encounter.   No results found for this or any previous visit (from the past 24 hour(s)). No results found.  ED Clinical Impression  Right upper quadrant abdominal pain   ED Assessment/Plan  Concern for liver disease, retained gallstone.  In the differential is perforation,  obstruction, pneumonia.  Feel that nephrolithiasis, PE is unlikely.   Transferring to the emergency department for comprehensive work-up.  Feel the patient is stable to go by private vehicle.  Advised patient do not have anything to eat or drink until her ER evaluation is complete.  Discussed  MDM, treatment plan, and plan for follow-up with patient. Discussed sn/sx that should prompt return to the ED. patient agrees with plan.   No orders of the defined types were placed in this encounter.   *This clinic note was created using Dragon dictation software. Therefore, there may be occasional mistakes despite careful proofreading.   ?   Melynda Ripple, MD 06/14/18 [phone]

## 2018-06-14 NOTE — ED Triage Notes (Signed)
Pt states she has been having LUQ pain for the last two days.  She states she has to hold her stomach when she coughs.  Pt had her gall bladder removed 20 years ago.

## 2018-06-15 ENCOUNTER — Emergency Department (HOSPITAL_COMMUNITY): Payer: Medicare Other

## 2018-06-15 ENCOUNTER — Ambulatory Visit (INDEPENDENT_AMBULATORY_CARE_PROVIDER_SITE_OTHER): Payer: Medicare Other | Admitting: Orthopaedic Surgery

## 2018-06-15 ENCOUNTER — Encounter (HOSPITAL_COMMUNITY): Payer: Self-pay | Admitting: Radiology

## 2018-06-15 DIAGNOSIS — E872 Acidosis, unspecified: Secondary | ICD-10-CM | POA: Diagnosis present

## 2018-06-15 DIAGNOSIS — R1011 Right upper quadrant pain: Secondary | ICD-10-CM

## 2018-06-15 DIAGNOSIS — E119 Type 2 diabetes mellitus without complications: Secondary | ICD-10-CM

## 2018-06-15 DIAGNOSIS — I5032 Chronic diastolic (congestive) heart failure: Secondary | ICD-10-CM

## 2018-06-15 DIAGNOSIS — R7401 Elevation of levels of liver transaminase levels: Secondary | ICD-10-CM | POA: Diagnosis present

## 2018-06-15 DIAGNOSIS — I10 Essential (primary) hypertension: Secondary | ICD-10-CM

## 2018-06-15 DIAGNOSIS — R740 Nonspecific elevation of levels of transaminase and lactic acid dehydrogenase [LDH]: Secondary | ICD-10-CM

## 2018-06-15 DIAGNOSIS — J449 Chronic obstructive pulmonary disease, unspecified: Secondary | ICD-10-CM

## 2018-06-15 DIAGNOSIS — R1013 Epigastric pain: Secondary | ICD-10-CM | POA: Diagnosis present

## 2018-06-15 LAB — URINALYSIS, ROUTINE W REFLEX MICROSCOPIC
Bilirubin Urine: NEGATIVE
Glucose, UA: >=500 mg/dL — AB
Ketones, ur: 5 mg/dL — AB
Leukocytes,Ua: NEGATIVE
Nitrite: NEGATIVE
Protein, ur: NEGATIVE mg/dL
Specific Gravity, Urine: >1.046 — ABNORMAL HIGH (ref 1.005–1.030)
pH: 6 (ref 5.0–8.0)

## 2018-06-15 LAB — GLUCOSE, CAPILLARY
Glucose-Capillary: 207 mg/dL — ABNORMAL HIGH (ref 70–99)
Glucose-Capillary: 261 mg/dL — ABNORMAL HIGH (ref 70–99)
Glucose-Capillary: 245 mg/dL — ABNORMAL HIGH (ref 70–99)
Glucose-Capillary: 301 mg/dL — ABNORMAL HIGH (ref 70–99)
Glucose-Capillary: 262 mg/dL — ABNORMAL HIGH (ref 70–99)

## 2018-06-15 LAB — COMPREHENSIVE METABOLIC PANEL
ALT: 58 U/L — ABNORMAL HIGH (ref 0–44)
AST: 67 U/L — ABNORMAL HIGH (ref 15–41)
Albumin: 3.4 g/dL — ABNORMAL LOW (ref 3.5–5.0)
Alkaline Phosphatase: 131 U/L — ABNORMAL HIGH (ref 38–126)
Anion gap: 14 (ref 5–15)
BUN: 7 mg/dL (ref 6–20)
CO2: 24 mmol/L (ref 22–32)
Calcium: 8.1 mg/dL — ABNORMAL LOW (ref 8.9–10.3)
Chloride: 97 mmol/L — ABNORMAL LOW (ref 98–111)
Creatinine, Ser: 0.81 mg/dL (ref 0.44–1.00)
GFR calc Af Amer: >60 mL/min (ref >60)
GFR calc non Af Amer: >60 mL/min (ref >60)
Glucose, Bld: 251 mg/dL — ABNORMAL HIGH (ref 70–99)
Potassium: 3.6 mmol/L (ref 3.5–5.1)
Sodium: 135 mmol/L (ref 135–145)
Total Bilirubin: 0.6 mg/dL (ref 0.3–1.2)
Total Protein: 6.8 g/dL (ref 6.5–8.1)

## 2018-06-15 LAB — CBC
HCT: 38.8 % (ref 36.0–46.0)
Hemoglobin: 12.1 g/dL (ref 12.0–15.0)
MCH: 26.4 pg (ref 26.0–34.0)
MCHC: 31.2 g/dL (ref 30.0–36.0)
MCV: 84.5 fL (ref 80.0–100.0)
Platelets: 269 10*3/uL (ref 150–400)
RBC: 4.59 MIL/uL (ref 3.87–5.11)
RDW: 15 % (ref 11.5–15.5)
WBC: 7.7 10*3/uL (ref 4.0–10.5)
nRBC: 0 % (ref 0.0–0.2)

## 2018-06-15 LAB — BRAIN NATRIURETIC PEPTIDE: B Natriuretic Peptide: 28.4 pg/mL (ref 0.0–100.0)

## 2018-06-15 LAB — I-STAT TROPONIN, ED: Troponin i, poc: 0 ng/mL (ref 0.00–0.08)

## 2018-06-15 LAB — LACTIC ACID, PLASMA
Lactic Acid, Venous: 6.1 mmol/L (ref 0.5–1.9)
Lactic Acid, Venous: 4.3 mmol/L (ref 0.5–1.9)
Lactic Acid, Venous: 3.8 mmol/L (ref 0.5–1.9)

## 2018-06-15 LAB — TROPONIN I
Troponin I: <0.03 ng/mL (ref <0.03)
Troponin I: <0.03 ng/mL (ref <0.03)
Troponin I: <0.03 ng/mL (ref <0.03)

## 2018-06-15 LAB — HIV ANTIBODY (ROUTINE TESTING W REFLEX): HIV Screen 4th Generation wRfx: NONREACTIVE

## 2018-06-15 LAB — INFLUENZA PANEL BY PCR (TYPE A & B)
Influenza A By PCR: NEGATIVE
Influenza B By PCR: NEGATIVE

## 2018-06-15 LAB — D-DIMER, QUANTITATIVE: D-Dimer, Quant: 0.5 ug/mL-FEU (ref 0.00–0.50)

## 2018-06-15 MED ORDER — CLONIDINE HCL 0.3 MG PO TABS
0.3000 mg | ORAL_TABLET | Freq: Every day | ORAL | Status: DC
  Administered 2018-06-15 – 2018-06-16 (×2): 0.3 mg via ORAL
  Filled 2018-06-15 (×2): qty 1

## 2018-06-15 MED ORDER — METOPROLOL SUCCINATE ER 100 MG PO TB24
200.0000 mg | ORAL_TABLET | Freq: Two times a day (BID) | ORAL | Status: DC
  Administered 2018-06-15 – 2018-06-16 (×4): 200 mg via ORAL
  Filled 2018-06-15 (×4): qty 2

## 2018-06-15 MED ORDER — AMITRIPTYLINE HCL 25 MG PO TABS
50.0000 mg | ORAL_TABLET | Freq: Every day | ORAL | Status: DC
  Administered 2018-06-15 – 2018-06-16 (×2): 50 mg via ORAL
  Filled 2018-06-15 (×2): qty 2

## 2018-06-15 MED ORDER — ONDANSETRON HCL 4 MG PO TABS: 4.0000 mg | ORAL_TABLET | Freq: Four times a day (QID) | ORAL | Status: DC | PRN

## 2018-06-15 MED ORDER — HEPARIN SODIUM (PORCINE) 5000 UNIT/ML IJ SOLN
5000.0000 [IU] | Freq: Three times a day (TID) | INTRAMUSCULAR | Status: DC
  Administered 2018-06-15 – 2018-06-17 (×5): 5000 [IU] via SUBCUTANEOUS
  Filled 2018-06-15 (×5): qty 1

## 2018-06-15 MED ORDER — MORPHINE SULFATE (PF) 2 MG/ML IV SOLN
2.0000 mg | INTRAVENOUS | Status: DC | PRN
  Filled 2018-06-15: qty 1

## 2018-06-15 MED ORDER — ACETAMINOPHEN 650 MG RE SUPP: 650.0000 mg | Freq: Four times a day (QID) | RECTAL | Status: DC | PRN

## 2018-06-15 MED ORDER — ALUM & MAG HYDROXIDE-SIMETH 200-200-20 MG/5ML PO SUSP
15.0000 mL | Freq: Once | ORAL | Status: AC
  Administered 2018-06-15: 15 mL via ORAL
  Filled 2018-06-15: qty 30

## 2018-06-15 MED ORDER — IOHEXOL 300 MG/ML  SOLN
100.0000 mL | Freq: Once | INTRAMUSCULAR | Status: AC | PRN
  Administered 2018-06-15: 100 mL via INTRAVENOUS

## 2018-06-15 MED ORDER — IPRATROPIUM-ALBUTEROL 0.5-2.5 (3) MG/3ML IN SOLN: 3.0000 mL | RESPIRATORY_TRACT | Status: DC | PRN

## 2018-06-15 MED ORDER — GABAPENTIN 400 MG PO CAPS
1200.0000 mg | ORAL_CAPSULE | Freq: Two times a day (BID) | ORAL | Status: DC
  Administered 2018-06-15 – 2018-06-16 (×4): 1200 mg via ORAL
  Filled 2018-06-15 (×4): qty 3

## 2018-06-15 MED ORDER — ONDANSETRON HCL 4 MG/2ML IJ SOLN
4.0000 mg | Freq: Once | INTRAMUSCULAR | Status: AC
  Administered 2018-06-15: 4 mg via INTRAVENOUS
  Filled 2018-06-15: qty 2

## 2018-06-15 MED ORDER — SODIUM CHLORIDE 0.9 % IV SOLN
2.0000 g | Freq: Every day | INTRAVENOUS | Status: DC
  Administered 2018-06-15 – 2018-06-16 (×2): 2 g via INTRAVENOUS
  Filled 2018-06-15 (×3): qty 20

## 2018-06-15 MED ORDER — INSULIN ASPART 100 UNIT/ML ~~LOC~~ SOLN
0.0000 [IU] | SUBCUTANEOUS | Status: DC
  Administered 2018-06-15: 7 [IU] via SUBCUTANEOUS
  Administered 2018-06-15 – 2018-06-16 (×4): 3 [IU] via SUBCUTANEOUS
  Administered 2018-06-16: 5 [IU] via SUBCUTANEOUS

## 2018-06-15 MED ORDER — ACETAMINOPHEN 500 MG PO TABS
1000.0000 mg | ORAL_TABLET | Freq: Once | ORAL | Status: AC
  Administered 2018-06-15: 1000 mg via ORAL
  Filled 2018-06-15: qty 2

## 2018-06-15 MED ORDER — ONDANSETRON HCL 4 MG/2ML IJ SOLN
4.0000 mg | Freq: Four times a day (QID) | INTRAMUSCULAR | Status: DC | PRN
  Administered 2018-06-15: 4 mg via INTRAVENOUS
  Filled 2018-06-15: qty 2

## 2018-06-15 MED ORDER — METRONIDAZOLE IN NACL 5-0.79 MG/ML-% IV SOLN
500.0000 mg | Freq: Three times a day (TID) | INTRAVENOUS | Status: DC
  Administered 2018-06-15 – 2018-06-17 (×6): 500 mg via INTRAVENOUS
  Filled 2018-06-15 (×7): qty 100

## 2018-06-15 MED ORDER — SODIUM CHLORIDE 0.9 % IV SOLN
INTRAVENOUS | Status: DC
  Administered 2018-06-15: 07:00:00 via INTRAVENOUS

## 2018-06-15 MED ORDER — ATORVASTATIN CALCIUM 10 MG PO TABS
20.0000 mg | ORAL_TABLET | Freq: Every day | ORAL | Status: DC
  Administered 2018-06-15: 20 mg via ORAL
  Filled 2018-06-15: qty 2

## 2018-06-15 MED ORDER — TIZANIDINE HCL 4 MG PO TABS
10.0000 mg | ORAL_TABLET | Freq: Two times a day (BID) | ORAL | Status: DC
  Administered 2018-06-15 – 2018-06-16 (×4): 10 mg via ORAL
  Filled 2018-06-15 (×5): qty 1

## 2018-06-15 MED ORDER — MORPHINE SULFATE (PF) 4 MG/ML IV SOLN
4.0000 mg | Freq: Once | INTRAVENOUS | Status: AC
  Administered 2018-06-15: 4 mg via INTRAVENOUS
  Filled 2018-06-15: qty 1

## 2018-06-15 MED ORDER — METRONIDAZOLE IN NACL 5-0.79 MG/ML-% IV SOLN
500.0000 mg | Freq: Once | INTRAVENOUS | Status: AC
  Administered 2018-06-15: 500 mg via INTRAVENOUS
  Filled 2018-06-15: qty 100

## 2018-06-15 MED ORDER — SODIUM CHLORIDE 0.9 % IV BOLUS
1000.0000 mL | Freq: Once | INTRAVENOUS | Status: AC
  Administered 2018-06-15: 1000 mL via INTRAVENOUS

## 2018-06-15 MED ORDER — SODIUM CHLORIDE 0.9 % IV SOLN
1.0000 g | Freq: Once | INTRAVENOUS | Status: DC
  Filled 2018-06-15: qty 10

## 2018-06-15 MED ORDER — LOSARTAN POTASSIUM 50 MG PO TABS
50.0000 mg | ORAL_TABLET | Freq: Two times a day (BID) | ORAL | Status: DC
  Administered 2018-06-15 – 2018-06-16 (×4): 50 mg via ORAL
  Filled 2018-06-15 (×4): qty 1

## 2018-06-15 MED ORDER — ACETAMINOPHEN 325 MG PO TABS
650.0000 mg | ORAL_TABLET | Freq: Four times a day (QID) | ORAL | Status: DC | PRN
  Administered 2018-06-15 – 2018-06-16 (×2): 650 mg via ORAL
  Filled 2018-06-15 (×2): qty 2

## 2018-06-15 MED ORDER — PANTOPRAZOLE SODIUM 40 MG IV SOLR
40.0000 mg | Freq: Two times a day (BID) | INTRAVENOUS | Status: DC
  Administered 2018-06-15 – 2018-06-16 (×4): 40 mg via INTRAVENOUS
  Filled 2018-06-15 (×4): qty 40

## 2018-06-15 MED ORDER — ASPIRIN EC 81 MG PO TBEC
81.0000 mg | DELAYED_RELEASE_TABLET | Freq: Every day | ORAL | Status: DC
  Administered 2018-06-15 – 2018-06-16 (×2): 81 mg via ORAL
  Filled 2018-06-15 (×2): qty 1

## 2018-06-15 MED ORDER — PANTOPRAZOLE SODIUM 40 MG PO TBEC: 80.0000 mg | DELAYED_RELEASE_TABLET | Freq: Every day | ORAL | Status: DC

## 2018-06-15 MED ORDER — MOMETASONE FURO-FORMOTEROL FUM 100-5 MCG/ACT IN AERO
2.0000 | INHALATION_SPRAY | Freq: Two times a day (BID) | RESPIRATORY_TRACT | Status: DC
  Administered 2018-06-15 – 2018-06-16 (×2): 2 via RESPIRATORY_TRACT
  Filled 2018-06-15: qty 8.8

## 2018-06-15 NOTE — ED Notes (Signed)
ED TO INPATIENT HANDOFF REPORT  ED Nurse Name and Phone #: [phone] Lucita Ferrara Name/Age/Gender Tammy Strickland 58 y.o. female Room/Bed: 026C/026C  Code Status   Code Status: Full Code  Home/SNF/Other Home Patient oriented to: self, place, time and situation Is this baseline? Yes   Triage Complete: Triage complete  Chief Complaint Right Upper Quad Abd Pain  Triage Note Pt reports RUQ pain since Saturday. Pt has had gallbladder removed. Also reports some n.v. no diarrhea. Pt a.o, nad noted   Allergies Allergies  Allergen Reactions  . Bacitracin Hives    Level of Care/Admitting Diagnosis ED Disposition    ED Disposition Condition Comment   Admit  Hospital Area: [facility] [[id number]]  Level of Care: Progressive [102]  I expect the patient will be discharged within 24 hours: No (not a candidate for 5C-Observation unit)  Diagnosis: Lactic acidosis [892119]  Admitting Physician: Etta Quill [phone]  Attending Physician: Etta Quill [[id number]  PT Class (Do Not Modify): Observation [104]  PT Acc Code (Do Not Modify): Observation [10022]       B Medical/Surgery History Past Medical History:  Diagnosis Date  . Anxiety   . Arthritis   . CHF (congestive heart failure) ([place])   . COPD (chronic obstructive pulmonary disease) ([place])   . Coronary artery disease   . Depression   . Diabetes mellitus without complication ([place])    type 2   . GERD (gastroesophageal reflux disease)   . Hypertension   . Lupus ([place])   . Migraines   . Neuromuscular disorder ([place])   . Osteoporosis   . Oxygen deficiency    pt uses 2.5L 02 at night   . Peripheral neuropathy   . Shingles   . Sleep apnea    had sleep study done recently ; unaware if she will be getting  a CPAP device ; patient states "im pretty sure i have it , i fall alseep all the time "  . Stroke [facility]) 10/2015   Past Surgical History:  Procedure Laterality Date  . ABDOMINAL HYSTERECTOMY     ovaries left   . APPENDECTOMY    . CHOLECYSTECTOMY    . IR GENERIC HISTORICAL  11/07/2015   IR ANGIO INTRA EXTRACRAN SEL COM CAROTID INNOMINATE BILAT MOD SED 11/07/2015 Luanne Bras, MD [facility]  . IR GENERIC HISTORICAL  11/07/2015   IR ANGIO VERTEBRAL SEL SUBCLAVIAN INNOMINATE UNI R MOD SED 11/07/2015 Luanne Bras, MD [facility]  . IR GENERIC HISTORICAL  11/07/2015   IR ANGIO VERTEBRAL SEL VERTEBRAL UNI L MOD SED 11/07/2015 Luanne Bras, MD [facility]  . IR GENERIC HISTORICAL  11/07/2015   IR ANGIOGRAM EXTREMITY LEFT 11/07/2015 Luanne Bras, MD [facility]  . TONSILLECTOMY    . TOTAL KNEE ARTHROPLASTY Left 03/12/2017   Procedure: LEFT TOTAL KNEE ARTHROPLASTY;  Surgeon: Mcarthur Rossetti, MD;  Location: [facility];  Service: Orthopedics;  Laterality: Left;  Adductor Block     A IV Location/Drains/Wounds Patient Lines/Drains/Airways Status   Active Line/Drains/Airways    Name:   Placement date:   Placement time:   Site:   Days:   Peripheral IV 06/15/18 Left;Lateral Antecubital   06/15/18    0126    Antecubital   less than 1   Airway   03/12/17    0657     460   Incision (Closed) 11/07/15 Groin Right   11/07/15    1110     951   Incision (Closed)  03/12/17 Knee Left   03/12/17    0843     460   Wound / Incision (Open or Dehisced) 10/31/15 Other (Comment) Knee Left pt states she got bit by something.   10/31/15    -    Knee   958          Intake/Output Last 24 hours No intake or output data in the 24 hours ending 06/15/18 0522  Labs/Imaging Results for orders placed or performed during the hospital encounter of 06/14/18 (from the past 48 hour(s))  Lipase, blood     Status: None   Collection Time: 06/14/18  6:51 PM  Result Value Ref Range   Lipase 18 11 - 51 U/L    Comment: Performed at [facility], [place] [address], [place], [place] [address]  Comprehensive metabolic panel     Status: Abnormal   Collection Time: 06/14/18  6:51 PM  Result Value Ref Range    Sodium 137 135 - 145 mmol/L   Potassium 3.6 3.5 - 5.1 mmol/L   Chloride 97 (L) 98 - 111 mmol/L   CO2 24 22 - 32 mmol/L   Glucose, Bld 226 (H) 70 - 99 mg/dL   BUN 7 6 - 20 mg/dL   Creatinine, Ser 0.91 0.44 - 1.00 mg/dL   Calcium 8.4 (L) 8.9 - 10.3 mg/dL   Total Protein 7.2 6.5 - 8.1 g/dL   Albumin 3.6 3.5 - 5.0 g/dL   AST 78 (H) 15 - 41 U/L   ALT 65 (H) 0 - 44 U/L   Alkaline Phosphatase 136 (H) 38 - 126 U/L   Total Bilirubin 0.6 0.3 - 1.2 mg/dL   GFR calc non Af Amer >60 >60 mL/min   GFR calc Af Amer >60 >60 mL/min   Anion gap 16 (H) 5 - 15    Comment: Performed at [facility], [place] [address], [place], [place] [address]  CBC     Status: None   Collection Time: 06/14/18  6:51 PM  Result Value Ref Range   WBC 8.7 4.0 - 10.5 K/uL   RBC 4.87 3.87 - 5.11 MIL/uL   Hemoglobin 13.1 12.0 - 15.0 g/dL   HCT 41.5 36.0 - 46.0 %   MCV 85.2 80.0 - 100.0 fL   MCH 26.9 26.0 - 34.0 pg   MCHC 31.6 30.0 - 36.0 g/dL   RDW 15.1 11.5 - 15.5 %   Platelets 277 150 - 400 K/uL   nRBC 0.0 0.0 - 0.2 %    Comment: Performed at [facility], [place] [address] [place], [place] [address]  D-dimer, quantitative (not at [facility])     Status: None   Collection Time: 06/15/18 12:22 AM  Result Value Ref Range   D-Dimer, Quant 0.50 0.00 - 0.50 ug/mL-FEU    Comment: (NOTE) At the manufacturer cut-off of 0.50 ug/mL FEU, this assay has been documented to exclude PE with a sensitivity and negative predictive value of 97 to 99%.  At this time, this assay has not been approved by the FDA to exclude DVT/VTE. Results should be correlated with clinical presentation. Performed at [facility], [place] [address], [place], [place] [address]   Lactic acid, plasma     Status: Abnormal   Collection Time: 06/15/18 12:25 AM  Result Value Ref Range   Lactic Acid, Venous 6.1 (HH) 0.5 - 1.9 mmol/L    Comment: CRITICAL RESULT CALLED TO, READ BACK BY AND VERIFIED WITH: MUNNETT  [facility] 06/15/18 0120 WAYK Performed at  [place] [address] [place], [place] [address]   I-stat troponin, ED     Status: None   Collection Time: 06/15/18 12:38 AM  Result Value Ref Range   Troponin i, poc 0.00 0.00 - 0.08 ng/mL   Comment 3            Comment: Due to the release kinetics of cTnI, a negative result within the first hours of the onset of symptoms does not rule out myocardial infarction with certainty. If myocardial infarction is still suspected, repeat the test at appropriate intervals.   CBC     Status: None   Collection Time: 06/15/18  4:26 AM  Result Value Ref Range   WBC 7.7 4.0 - 10.5 K/uL   RBC 4.59 3.87 - 5.11 MIL/uL   Hemoglobin 12.1 12.0 - 15.0 g/dL   HCT 38.8 36.0 - 46.0 %   MCV 84.5 80.0 - 100.0 fL   MCH 26.4 26.0 - 34.0 pg   MCHC 31.2 30.0 - 36.0 g/dL   RDW 15.0 11.5 - 15.5 %   Platelets 269 150 - 400 K/uL   nRBC 0.0 0.0 - 0.2 %    Comment: Performed at [facility], [place] [address] [place], [place] [address]   Dg Chest 2 View  Result Date: 06/15/2018 CLINICAL DATA:  Right chest pain EXAM: CHEST - 2 VIEW COMPARISON:  08/05/2017 FINDINGS: The heart size and mediastinal contours are within normal limits. Both lungs are clear. The visualized skeletal structures are unremarkable. IMPRESSION: No active cardiopulmonary disease. Electronically Signed   By: Ulyses Jarred M.D.   On: 06/15/2018 01:06   Ct Abdomen Pelvis W Contrast  Result Date: 06/15/2018 CLINICAL DATA:  Abdominal pain EXAM: CT ABDOMEN AND PELVIS WITH CONTRAST TECHNIQUE: Multidetector CT imaging of the abdomen and pelvis was performed using the standard protocol following bolus administration of intravenous contrast. CONTRAST:  11mL OMNIPAQUE IOHEXOL 300 MG/ML  SOLN COMPARISON:  None. FINDINGS: LOWER CHEST: Ground-glass opacities in both lung bases. HEPATOBILIARY: Diffuse hypoattenuation of the liver relative to the spleen suggests hepatic steatosis. No focal liver lesion or biliary dilatation. Status post  cholecystectomy. PANCREAS: The pancreatic parenchymal contours are normal and there is no ductal dilatation. There is no peripancreatic fluid collection. SPLEEN: Normal. ADRENALS/URINARY TRACT: --Adrenal glands: Normal. --Right kidney/ureter: No hydronephrosis, nephroureterolithiasis, perinephric stranding or solid renal mass. --Left kidney/ureter: No hydronephrosis, nephroureterolithiasis, perinephric stranding or solid renal mass. --Urinary bladder: Normal for degree of distention STOMACH/BOWEL: --Stomach/Duodenum: There is no hiatal hernia or other gastric abnormality. The duodenal course and caliber are normal. --Small bowel: No dilatation or inflammation. --Colon: No focal abnormality. --Appendix: Surgically absent. VASCULAR/LYMPHATIC: There is aortic atherosclerosis without hemodynamically significant stenosis. No abdominal or pelvic lymphadenopathy. REPRODUCTIVE: Status post hysterectomy. No adnexal mass. MUSCULOSKELETAL. No bony spinal canal stenosis or focal osseous abnormality. OTHER: None. IMPRESSION: 1. No acute abdominal or pelvic abnormality. 2. Bibasilar ground-glass pulmonary opacities, which may indicate pulmonary edema. 3.  Aortic atherosclerosis (ICD10-I70.0).  A Electronically Signed   By: Ulyses Jarred M.D.   On: 06/15/2018 02:27   US Abdomen Limited Ruq  Result Date: 06/15/2018 CLINICAL DATA:  Abdominal pain EXAM: ULTRASOUND ABDOMEN LIMITED RIGHT UPPER QUADRANT COMPARISON:  CT 06/15/2018 FINDINGS: Gallbladder: Surgically absent Common bile duct: Diameter: Not clearly identified Liver: Echogenic liver without focal hepatic abnormality. Portal vein is patent on color Doppler imaging with normal direction of blood flow towards the liver. IMPRESSION: 1. Status  post cholecystectomy. Common bile duct was not clearly identified, secondary to habitus 2. Echogenic liver consistent with steatosis and or hepatocellular disease. Electronically Signed   By: Donavan Foil M.D.   On: 06/15/2018 03:23     Pending Labs Unresulted Labs (From admission, onward)    Start     Ordered   06/15/18 0517  Troponin I - Now Then Q6H  Now then every 6 hours,   R     06/15/18 0516   06/15/18 0509  Brain natriuretic peptide  Once,   R     06/15/18 0508   06/15/18 0500  Comprehensive metabolic panel  Tomorrow morning,   R     06/15/18 0403   06/15/18 0445  HIV antibody (Routine Testing)  Once,   R     06/15/18 0445   06/15/18 0420  Hepatitis panel, acute  Once,   R     06/15/18 0419   06/15/18 0414  Lactic acid, plasma  STAT Now then every 3 hours,   R     06/15/18 0414   06/15/18 0339  Blood culture (routine x 2)  BLOOD CULTURE X 2,   STAT     06/15/18 0338   06/15/18 0251  Influenza panel by PCR (type A & B)  (Influenza PCR Panel)  Once,   R     06/15/18 0251   06/14/18 2342  Lactic acid, plasma  Now then every 2 hours,   STAT     06/14/18 2341   06/14/18 1830  Urinalysis, Routine w reflex microscopic  ONCE - STAT,   STAT     06/14/18 1829          Vitals/Pain Today's Vitals   06/15/18 0249 06/15/18 0415 06/15/18 0430 06/15/18 0445  BP:      Pulse:  95 97 93  Resp:  (!) 22 16 (!) 22  Temp:      TempSrc:      SpO2:  100% 100% 100%  PainSc: 9        Isolation Precautions Droplet precaution  Medications Medications  sodium chloride flush (NS) 0.9 % injection 3 mL (has no administration in time range)  sodium chloride 0.9 % bolus 1,000 mL (has no administration in time range)  metroNIDAZOLE (FLAGYL) IVPB 500 mg (has no administration in time range)  0.9 %  sodium chloride infusion (has no administration in time range)  pantoprazole (PROTONIX) injection 40 mg (has no administration in time range)  metroNIDAZOLE (FLAGYL) IVPB 500 mg (has no administration in time range)  cefTRIAXone (ROCEPHIN) 2 g in sodium chloride 0.9 % 100 mL IVPB (2 g Intravenous New Bag/Given 06/15/18 0450)  morphine 2 MG/ML injection 2-4 mg (has no administration in time range)  acetaminophen (TYLENOL)  tablet 650 mg (has no administration in time range)    Or  acetaminophen (TYLENOL) suppository 650 mg (has no administration in time range)  ondansetron (ZOFRAN) tablet 4 mg (has no administration in time range)    Or  ondansetron (ZOFRAN) injection 4 mg (has no administration in time range)  heparin injection 5,000 Units (has no administration in time range)  cloNIDine (CATAPRES) tablet 0.3 mg (has no administration in time range)  gabapentin (NEURONTIN) capsule 1,200 mg (has no administration in time range)  ipratropium-albuterol (DUONEB) 0.5-2.5 (3) MG/3ML nebulizer solution 3 mL (has no administration in time range)  losartan (COZAAR) tablet 50 mg (has no administration in time range)  metoprolol succinate (TOPROL-XL) 24 hr tablet 200 mg (  has no administration in time range)  mometasone-formoterol (DULERA) 100-5 MCG/ACT inhaler 2 puff (has no administration in time range)  atorvastatin (LIPITOR) tablet 20 mg (has no administration in time range)  aspirin EC tablet 81 mg (has no administration in time range)  amitriptyline (ELAVIL) tablet 50 mg (has no administration in time range)  insulin aspart (novoLOG) injection 0-9 Units (has no administration in time range)  sodium chloride 0.9 % bolus 1,000 mL (1,000 mLs Intravenous New Bag/Given 06/15/18 0250)  morphine 4 MG/ML injection 4 mg (4 mg Intravenous Given 06/15/18 0248)  ondansetron (ZOFRAN) injection 4 mg (4 mg Intravenous Given 06/15/18 0246)  alum & mag hydroxide-simeth (MAALOX/MYLANTA) 200-200-20 MG/5ML suspension 15 mL (15 mLs Oral Given 06/15/18 0246)  iohexol (OMNIPAQUE) 300 MG/ML solution 100 mL (100 mLs Intravenous Contrast Given 06/15/18 0203)  acetaminophen (TYLENOL) tablet 1,000 mg (1,000 mg Oral Given 06/15/18 0444)    Mobility walks with device High fall risk   Focused Assessments N   R Recommendations: See Admitting Provider Note  Report given to:   Additional Notes: None

## 2018-06-15 NOTE — Progress Notes (Signed)
PROGRESS NOTE    Tammy Strickland  AST:[id number] DOB: November 25, 1960 DOA: 06/14/2018 PCP: Binnie Rail, MD   Brief Narrative:  HPI On 06/15/2018 by Dr. Jennette Kettle Tammy Strickland is a 58 y.o. female with medical history significant of DM2, HTN, COPD, chronic diastolic CHF.  Patient presents to the ED with c/o epigastric and RUQ abd pain, nausea, occasional vomiting, no diarrhea.  Symptoms ongoing for the past 5 days and are constant.  Abd pain worse when she coughs.  Also has cough and URI symptoms.  No fevers nor chills.  No dysuria, no back or flank pain.  Abd pain feels better if she holds abdomen while coughing. Assessment & Plan   Admitted today by Dr. Jennette Kettle, see H&P for details.  Lactic acidosis with abdominal pain -likely due to dehydration -lactic acid was 6.1 on admission, currently down to 3.8 -Influenza PCR unremarkable, UA and CXR unremarkable for infection -Blood cultures pending -Anion gap WNL, -CBGs 207-261 -RUQ [place]; S/p cholecystectomy. CBD not clearly identified, secondary to habitus. Echogenic liver consistent with steatosis and or hepatocellular disease -CT abdomen pelvis showed no acute abdominal or pelvic abnormality -Metformin held -?  Gastroparesis, however patient with no vomiting at this point and would like to eat -Placed on clear liquid diet and advance as tolerated -If no improvement, will obtain GI consult -Continue pain control -patient placed on ceftriaxone for intra-abdominal coverage  Transaminitis -Likely secondary to fatty liver noted on ultrasound -Continue IVF and monitor CMP -Will discontinue atorvastatin  Essential hypertension -Continue clonidine, losartan  Chronic diastolic heart failure -Lasix currently held -Appears to be euvolemic and compensated -BNP 28.4, trop negative -Monitor intake and output, daily weights closely as patient is receiving IV fluids for lactic acidosis  COPD -Continue home medications, currently  stable  Diabetes mellitus, type II -Metformin held -Continue insulin sliding scale with CBG monitoring  DVT Prophylaxis heparin  Code Status: Full  Family Communication: None at bedside  Disposition Plan: Observation.  Suspect home within 1 to 2 days pending improvement  Consultants None  Procedures  RUQ ultrasound  Antibiotics   Anti-infectives (From admission, onward)   Start     Dose/Rate Route Frequency Ordered Stop   06/15/18 1200  metroNIDAZOLE (FLAGYL) IVPB 500 mg     500 mg 100 mL/hr over 60 Minutes Intravenous Every 8 hours 06/15/18 0425     06/15/18 0500  cefTRIAXone (ROCEPHIN) 2 g in sodium chloride 0.9 % 100 mL IVPB     2 g 200 mL/hr over 30 Minutes Intravenous Daily 06/15/18 0425     06/15/18 0345  cefTRIAXone (ROCEPHIN) 1 g in sodium chloride 0.9 % 100 mL IVPB  Status:  Discontinued     1 g 200 mL/hr over 30 Minutes Intravenous  Once 06/15/18 0338 06/15/18 0432   06/15/18 0345  metroNIDAZOLE (FLAGYL) IVPB 500 mg     500 mg 100 mL/hr over 60 Minutes Intravenous  Once 06/15/18 0338 06/15/18 0550      Subjective:   Tammy Strickland seen and examined today.  Continues to have abdominal pain without nausea, vomiting, diarrhea or constipation.  Patient states she has not eaten in days and would like to eat now.  Denies chest pain, shortness of breath, dizziness or headache.  Objective:   Vitals:   06/15/18 0538 06/15/18 0622 06/15/18 0808 06/15/18 1123  BP: (!) 146/84 (!) 158/105 (!) 151/90 (!) 160/97  Pulse: 87 86 86 85  Resp: (!) 22 (!) 24 (!) 22 (!) 21  Temp:  99.2 F (37.3 C) 99.1 F (37.3 C) 98.2 F (36.8 C)  TempSrc:  Oral Oral Oral  SpO2: 100% 98% 98% 94%   No intake or output data in the 24 hours ending 06/15/18 1321 There were no vitals filed for this visit.  Exam  General: Well developed, well nourished, NAD, appears stated age  53: NCAT, mucous membranes moist.   Cardiovascular: S1 S2 auscultated,RRR  Respiratory: Clear to  auscultation bilaterally with equal chest rise  Abdomen: Soft, obese, Epigastric/RUQ TTP, nondistended, + bowel sounds  Extremities: warm dry without cyanosis clubbing or edema  Neuro: AAOx3, nonfocal  Psych: Normal affect and demeanor    Data Reviewed: I have personally reviewed following labs and imaging studies  CBC: Recent Labs  Lab 06/14/18 1851 06/15/18 0426  WBC 8.7 7.7  HGB 13.1 12.1  HCT 41.5 38.8  MCV 85.2 84.5  PLT 277 426   Basic Metabolic Panel: Recent Labs  Lab 06/14/18 1851 06/15/18 0426  NA 137 135  K 3.6 3.6  CL 97* 97*  CO2 24 24  GLUCOSE 226* 251*  BUN 7 7  CREATININE 0.91 0.81  CALCIUM 8.4* 8.1*   GFR: CrCl cannot be calculated (Unknown ideal weight.). Liver Function Tests: Recent Labs  Lab 06/14/18 1851 06/15/18 0426  AST 78* 67*  ALT 65* 58*  ALKPHOS 136* 131*  BILITOT 0.6 0.6  PROT 7.2 6.8  ALBUMIN 3.6 3.4*   Recent Labs  Lab 06/14/18 1851  LIPASE 18   No results for input(s): AMMONIA in the last 168 hours. Coagulation Profile: No results for input(s): INR, PROTIME in the last 168 hours. Cardiac Enzymes: Recent Labs  Lab 06/15/18 0650 06/15/18 1117  TROPONINI <0.03 <0.03   BNP (last 3 results) No results for input(s): PROBNP in the last 8760 hours. HbA1C: No results for input(s): HGBA1C in the last 72 hours. CBG: Recent Labs  Lab 06/15/18 0639 06/15/18 0805 06/15/18 1123  GLUCAP 207* 261* 245*   Lipid Profile: No results for input(s): CHOL, HDL, LDLCALC, TRIG, CHOLHDL, LDLDIRECT in the last 72 hours. Thyroid Function Tests: No results for input(s): TSH, T4TOTAL, FREET4, T3FREE, THYROIDAB in the last 72 hours. Anemia Panel: No results for input(s): VITAMINB12, FOLATE, FERRITIN, TIBC, IRON, RETICCTPCT in the last 72 hours. Urine analysis:    Component Value Date/Time   COLORURINE YELLOW 06/15/2018 0537   APPEARANCEUR CLEAR 06/15/2018 0537   LABSPEC >1.046 (H) 06/15/2018 0537   PHURINE 6.0 06/15/2018 0537     GLUCOSEU >=500 (A) 06/15/2018 0537   GLUCOSEU >=1000 (A) 07/21/2016 1039   HGBUR SMALL (A) 06/15/2018 0537   BILIRUBINUR NEGATIVE 06/15/2018 0537   BILIRUBINUR neg 02/26/2017 1112   KETONESUR 5 (A) 06/15/2018 0537   PROTEINUR NEGATIVE 06/15/2018 0537   UROBILINOGEN 0.2 02/26/2017 1112   UROBILINOGEN 0.2 07/21/2016 1039   NITRITE NEGATIVE 06/15/2018 0537   LEUKOCYTESUR NEGATIVE 06/15/2018 0537   Sepsis Labs: @LABRCNTIP (procalcitonin:4,lacticidven:4)  )No results found for this or any previous visit (from the past 240 hour(s)).    Radiology Studies: Dg Chest 2 View  Result Date: 06/15/2018 CLINICAL DATA:  Right chest pain EXAM: CHEST - 2 VIEW COMPARISON:  08/05/2017 FINDINGS: The heart size and mediastinal contours are within normal limits. Both lungs are clear. The visualized skeletal structures are unremarkable. IMPRESSION: No active cardiopulmonary disease. Electronically Signed   By: Ulyses Jarred M.D.   On: 06/15/2018 01:06   Ct Abdomen Pelvis W Contrast  Result Date: 06/15/2018 CLINICAL DATA:  Abdominal pain  EXAM: CT ABDOMEN AND PELVIS WITH CONTRAST TECHNIQUE: Multidetector CT imaging of the abdomen and pelvis was performed using the standard protocol following bolus administration of intravenous contrast. CONTRAST:  129mL OMNIPAQUE IOHEXOL 300 MG/ML  SOLN COMPARISON:  None. FINDINGS: LOWER CHEST: Ground-glass opacities in both lung bases. HEPATOBILIARY: Diffuse hypoattenuation of the liver relative to the spleen suggests hepatic steatosis. No focal liver lesion or biliary dilatation. Status post cholecystectomy. PANCREAS: The pancreatic parenchymal contours are normal and there is no ductal dilatation. There is no peripancreatic fluid collection. SPLEEN: Normal. ADRENALS/URINARY TRACT: --Adrenal glands: Normal. --Right kidney/ureter: No hydronephrosis, nephroureterolithiasis, perinephric stranding or solid renal mass. --Left kidney/ureter: No hydronephrosis, nephroureterolithiasis,  perinephric stranding or solid renal mass. --Urinary bladder: Normal for degree of distention STOMACH/BOWEL: --Stomach/Duodenum: There is no hiatal hernia or other gastric abnormality. The duodenal course and caliber are normal. --Small bowel: No dilatation or inflammation. --Colon: No focal abnormality. --Appendix: Surgically absent. VASCULAR/LYMPHATIC: There is aortic atherosclerosis without hemodynamically significant stenosis. No abdominal or pelvic lymphadenopathy. REPRODUCTIVE: Status post hysterectomy. No adnexal mass. MUSCULOSKELETAL. No bony spinal canal stenosis or focal osseous abnormality. OTHER: None. IMPRESSION: 1. No acute abdominal or pelvic abnormality. 2. Bibasilar ground-glass pulmonary opacities, which may indicate pulmonary edema. 3.  Aortic atherosclerosis (ICD10-I70.0).  A Electronically Signed   By: Ulyses Jarred M.D.   On: 06/15/2018 02:27   US Abdomen Limited Ruq  Result Date: 06/15/2018 CLINICAL DATA:  Abdominal pain EXAM: ULTRASOUND ABDOMEN LIMITED RIGHT UPPER QUADRANT COMPARISON:  CT 06/15/2018 FINDINGS: Gallbladder: Surgically absent Common bile duct: Diameter: Not clearly identified Liver: Echogenic liver without focal hepatic abnormality. Portal vein is patent on color Doppler imaging with normal direction of blood flow towards the liver. IMPRESSION: 1. Status post cholecystectomy. Common bile duct was not clearly identified, secondary to habitus 2. Echogenic liver consistent with steatosis and or hepatocellular disease. Electronically Signed   By: Donavan Foil M.D.   On: 06/15/2018 03:23     Scheduled Meds: . amitriptyline  50 mg Oral QHS  . aspirin EC  81 mg Oral Daily  . atorvastatin  20 mg Oral Daily  . cloNIDine  0.3 mg Oral Daily  . gabapentin  1,200 mg Oral BID  . heparin  5,000 Units Subcutaneous Q8H  . insulin aspart  0-9 Units Subcutaneous Q4H  . losartan  50 mg Oral BID  . metoprolol  200 mg Oral BID  . mometasone-formoterol  2 puff Inhalation BID  .  pantoprazole (PROTONIX) IV  40 mg Intravenous Q12H  . sodium chloride flush  3 mL Intravenous Once  . tiZANidine  10 mg Oral BID   Continuous Infusions: . sodium chloride 125 mL/hr at 06/15/18 0631  . cefTRIAXone (ROCEPHIN)  IV Stopped (06/15/18 0530)  . metronidazole       LOS: 0 days   Time Spent in minutes   30 minutes  Tammy Strickland D.O. on 06/15/2018 at 1:21 PM  Between 7am to 7pm - Please see pager noted on [email or web address]  After 7pm go to [email or web address]  And look for the night coverage person covering for me after hours  [facility] Office  [phone]

## 2018-06-15 NOTE — Progress Notes (Signed)
Inpatient Diabetes Program Recommendations  AACE/ADA: New Consensus Statement on Inpatient Glycemic Control (2015)  Target Ranges:  Prepandial:   less than 140 mg/dL      Peak postprandial:   less than 180 mg/dL (1-2 hours)      Critically ill patients:  140 - 180 mg/dL   Lab Results  Component Value Date   GLUCAP 261 (H) 06/15/2018   HGBA1C 9.1 (H) 05/12/2018    Review of Glycemic Control  Diabetes history: DM 2 Outpatient Diabetes medications: Metformin 1000 mg BID Current orders for Inpatient glycemic control: Novolog 0-9 units Q4 hours  A1c 9.1% on 1/30  Inpatient Diabetes Program Recommendations:    Patient with lactic acidosis and on Metformin 1000 mg BID at home. Patient may need alternative oral DM med at time of d/c. Glucose trends in the 200 range. Correction scale to start this am, however, patient may benefit from Novolog moderate correction 0-15 units Q4 hours. Renal function WNL.  Thanks,  Tama Headings RN, MSN, BC-ADM Inpatient Diabetes Coordinator Team Pager [phone] (8a-5p)

## 2018-06-15 NOTE — H&P (Signed)
History and Physical    Tammy Strickland UEA:[id number] DOB: 05-Jan-1961 DOA: 06/14/2018  PCP: Binnie Rail, MD  Patient coming from: Home  I have personally briefly reviewed patient's old medical records in [place]  Chief Complaint: Abd pain  HPI: Tammy Strickland is a 58 y.o. female with medical history significant of DM2, HTN, COPD, chronic diastolic CHF.  Patient presents to the ED with c/o epigastric and RUQ abd pain, nausea, occasional vomiting, no diarrhea.  Symptoms ongoing for the past 5 days and are constant.  Abd pain worse when she coughs.  Also has cough and URI symptoms.  No fevers nor chills.  No dysuria, no back or flank pain.  Abd pain feels better if she holds abdomen while coughing.   ED Course: CT abd/pelvis negative for acute findings as is RUQ [place].  Mild transaminitis    Review of Systems: As per HPI otherwise 10 point review of systems negative. Trop neg.  Lactate is 6.1 initially.  2L NS ordered, rocephin and flagyl ordered.  1L NS actually given at this point.  Repeat lactate not yet done.  Past Medical History:  Diagnosis Date  . Anxiety   . Arthritis   . CHF (congestive heart failure) ([place])   . COPD (chronic obstructive pulmonary disease) ([place])   . Coronary artery disease   . Depression   . Diabetes mellitus without complication ([place])    type 2   . GERD (gastroesophageal reflux disease)   . Hypertension   . Lupus ([place])   . Migraines   . Neuromuscular disorder ([place])   . Osteoporosis   . Oxygen deficiency    pt uses 2.5L 02 at night   . Peripheral neuropathy   . Shingles   . Sleep apnea    had sleep study done recently ; unaware if she will be getting  a CPAP device ; patient states "im pretty sure i have it , i fall alseep all the time "  . Stroke [facility]) 10/2015    Past Surgical History:  Procedure Laterality Date  . ABDOMINAL HYSTERECTOMY     ovaries left  . APPENDECTOMY    . CHOLECYSTECTOMY    . IR GENERIC HISTORICAL   11/07/2015   IR ANGIO INTRA EXTRACRAN SEL COM CAROTID INNOMINATE BILAT MOD SED 11/07/2015 Luanne Bras, MD [facility]  . IR GENERIC HISTORICAL  11/07/2015   IR ANGIO VERTEBRAL SEL SUBCLAVIAN INNOMINATE UNI R MOD SED 11/07/2015 Luanne Bras, MD [facility]  . IR GENERIC HISTORICAL  11/07/2015   IR ANGIO VERTEBRAL SEL VERTEBRAL UNI L MOD SED 11/07/2015 Luanne Bras, MD [facility]  . IR GENERIC HISTORICAL  11/07/2015   IR ANGIOGRAM EXTREMITY LEFT 11/07/2015 Luanne Bras, MD [facility]  . TONSILLECTOMY    . TOTAL KNEE ARTHROPLASTY Left 03/12/2017   Procedure: LEFT TOTAL KNEE ARTHROPLASTY;  Surgeon: Mcarthur Rossetti, MD;  Location: [facility];  Service: Orthopedics;  Laterality: Left;  Adductor Block     reports that she has been smoking cigarettes. She has a 8.75 pack-year smoking history. She has never used smokeless tobacco. She reports that she does not drink alcohol or use drugs.  Allergies  Allergen Reactions  . Bacitracin Hives    Family History  Problem Relation Age of Onset  . Hyperlipidemia Mother   . Hypertension Mother   . Stroke Mother   . Thyroid disease Mother   . Heart attack Mother   . Hyperlipidemia Father   . Hypertension Father   .  Stroke Father   . Heart attack Father   . Hypertension Sister   . Cancer Sister        breast cancer  . Stroke Sister   . Thyroid disease Sister   . Crohn's disease Sister   . Hypertension Sister   . Hypertension Brother   . Diabetes Brother   . Hypertension Brother      Prior to Admission medications   Medication Sig Start Date End Date Taking? Authorizing Provider  amitriptyline (ELAVIL) 50 MG tablet Take 1 tablet by mouth at bedtime 04/21/18  Yes Burns, Claudina Lick, MD  aspirin EC 81 MG tablet Take 1 tablet (81 mg total) by mouth daily. 11/01/15  Yes Elgergawy, Silver Huguenin, MD  atorvastatin (LIPITOR) 20 MG tablet TAKE 1 TABLET(20 MG) BY MOUTH DAILY Patient taking differently: Take 20 mg by mouth every  morning.  05/11/18  Yes Burns, Claudina Lick, MD  cloNIDine (CATAPRES) 0.3 MG tablet Take 1 tablet (0.3 mg total) by mouth daily. 07/07/17  Yes Burns, Claudina Lick, MD  fluticasone (FLONASE) 50 MCG/ACT nasal spray Shake liquid & use 1 spray into each nostril every day as needed for allergies 03/21/18  Yes Burns, Claudina Lick, MD  furosemide (LASIX) 80 MG tablet Take 1 tablet (80 mg total) by mouth daily. 07/07/17  Yes Burns, Claudina Lick, MD  gabapentin (NEURONTIN) 600 MG tablet Take 2 tablets by mouth 3 times a day Patient taking differently: Take 1,200 mg by mouth 2 (two) times daily.  04/20/18  Yes Burns, Claudina Lick, MD  glucose blood test strip Use as instructed to check sugar three times a day 01/28/17  Yes Burns, Claudina Lick, MD  ipratropium-albuterol (DUONEB) 0.5-2.5 (3) MG/3ML SOLN Take 3 mLs by nebulization every 4 (four) hours as needed. Patient taking differently: Take 3 mLs by nebulization every 4 (four) hours as needed (SOB).  05/26/18  Yes Raylene Everts, MD  Lancets MISC Use as directed twice per day 01/28/17  Yes Burns, Claudina Lick, MD  losartan (COZAAR) 50 MG tablet Take 1 tablet (50 mg total) by mouth 2 (two) times daily. 07/07/17  Yes Burns, Claudina Lick, MD  metFORMIN (GLUCOPHAGE-XR) 500 MG 24 hr tablet Take 2 tablets by mouth twice daily with a meal 04/20/18  Yes Burns, Claudina Lick, MD  metoprolol (TOPROL-XL) 200 MG 24 hr tablet Take 1 tablet (200 mg total) by mouth 2 (two) times daily. 07/07/17  Yes Burns, Claudina Lick, MD  mometasone (NASONEX) 50 MCG/ACT nasal spray Place 2 sprays into the nose daily. Patient taking differently: Place 2 sprays into the nose daily as needed (allergies).  01/04/18  Yes Burns, Claudina Lick, MD  nitroGLYCERIN (NITROSTAT) 0.4 MG SL tablet Place 0.4 mg under the tongue every 5 (five) minutes as needed for chest pain.   Yes [provider]  omeprazole (PRILOSEC) 40 MG capsule Take 1 capsule (40 mg total) by mouth daily. 09/21/17  Yes Burns, Claudina Lick, MD  oxyCODONE-acetaminophen (PERCOCET) 10-325 MG  tablet Take 1 tablet by mouth every 6 (six) hours as needed for pain.  01/31/18  Yes [provider]  potassium chloride SA (K-DUR,KLOR-CON) 20 MEQ tablet Take 1 tablet (20 mEq total) by mouth daily. 07/07/17  Yes Burns, Claudina Lick, MD  SYMBICORT 80-4.5 MCG/ACT inhaler Inhale 2 Puffs by mouth twice daily Patient taking differently: Inhale 2 puffs into the lungs daily as needed (SOB).  12/28/17  Yes Burns, Claudina Lick, MD  tiZANidine (ZANAFLEX) 4 MG tablet TAKE FOUR TABLETS BY  MOUTH TWICE DAILY Patient taking differently: Take 10 mg by mouth 2 (two) times daily.  05/26/18  Yes Burns, Claudina Lick, MD  predniSONE (DELTASONE) 20 MG tablet Take 1 tablet (20 mg total) by mouth 2 (two) times daily with a meal. Patient not taking: Reported on 06/15/2018 05/26/18   Raylene Everts, MD    Physical Exam: Vitals:   06/15/18 0130 06/15/18 0145 06/15/18 0415 06/15/18 0430  BP: (!) 144/88 (!) 158/100    Pulse: 90 92 95 97  Resp:   (!) 22 16  Temp:      TempSrc:      SpO2: 100% 100% 100% 100%    Constitutional: NAD, calm, comfortable Eyes: PERRL, lids and conjunctivae normal ENMT: Mucous membranes are moist. Posterior pharynx clear of any exudate or lesions.Normal dentition.  Neck: normal, supple, no masses, no thyromegaly Respiratory: clear to auscultation bilaterally, no wheezing, no crackles. Normal respiratory effort. No accessory muscle use.  Cardiovascular: Regular rate and rhythm, no murmurs / rubs / gallops. No extremity edema. 2+ pedal pulses. No carotid bruits.  Abdomen: TTP epigastric and RUQ Musculoskeletal: no clubbing / cyanosis. No joint deformity upper and lower extremities. Good ROM, no contractures. Normal muscle tone.  Skin: no rashes, lesions, ulcers. No induration Neurologic: CN 2-12 grossly intact. Sensation intact, DTR normal. Strength 5/5 in all 4.  Psychiatric: Normal judgment and insight. Alert and oriented x 3. Normal mood.    Labs on Admission: I have personally reviewed  following labs and imaging studies  CBC: Recent Labs  Lab 06/14/18 1851  WBC 8.7  HGB 13.1  HCT 41.5  MCV 85.2  PLT 272   Basic Metabolic Panel: Recent Labs  Lab 06/14/18 1851  NA 137  K 3.6  CL 97*  CO2 24  GLUCOSE 226*  BUN 7  CREATININE 0.91  CALCIUM 8.4*   GFR: CrCl cannot be calculated (Unknown ideal weight.). Liver Function Tests: Recent Labs  Lab 06/14/18 1851  AST 78*  ALT 65*  ALKPHOS 136*  BILITOT 0.6  PROT 7.2  ALBUMIN 3.6   Recent Labs  Lab 06/14/18 1851  LIPASE 18   No results for input(s): AMMONIA in the last 168 hours. Coagulation Profile: No results for input(s): INR, PROTIME in the last 168 hours. Cardiac Enzymes: No results for input(s): CKTOTAL, CKMB, CKMBINDEX, TROPONINI in the last 168 hours. BNP (last 3 results) No results for input(s): PROBNP in the last 8760 hours. HbA1C: No results for input(s): HGBA1C in the last 72 hours. CBG: No results for input(s): GLUCAP in the last 168 hours. Lipid Profile: No results for input(s): CHOL, HDL, LDLCALC, TRIG, CHOLHDL, LDLDIRECT in the last 72 hours. Thyroid Function Tests: No results for input(s): TSH, T4TOTAL, FREET4, T3FREE, THYROIDAB in the last 72 hours. Anemia Panel: No results for input(s): VITAMINB12, FOLATE, FERRITIN, TIBC, IRON, RETICCTPCT in the last 72 hours. Urine analysis:    Component Value Date/Time   COLORURINE YELLOW 03/22/2017 1750   APPEARANCEUR CLEAR 03/22/2017 1750   LABSPEC 1.015 03/22/2017 1750   PHURINE 5.0 03/22/2017 1750   GLUCOSEU NEGATIVE 03/22/2017 1750   GLUCOSEU >=1000 (A) 07/21/2016 1039   HGBUR NEGATIVE 03/22/2017 1750   BILIRUBINUR NEGATIVE 03/22/2017 1750   BILIRUBINUR neg 02/26/2017 1112   KETONESUR NEGATIVE 03/22/2017 1750   PROTEINUR NEGATIVE 03/22/2017 1750   UROBILINOGEN 0.2 02/26/2017 1112   UROBILINOGEN 0.2 07/21/2016 1039   NITRITE NEGATIVE 03/22/2017 1750   LEUKOCYTESUR NEGATIVE 03/22/2017 1750    Radiological Exams on  Admission:  Dg Chest 2 View  Result Date: 06/15/2018 CLINICAL DATA:  Right chest pain EXAM: CHEST - 2 VIEW COMPARISON:  08/05/2017 FINDINGS: The heart size and mediastinal contours are within normal limits. Both lungs are clear. The visualized skeletal structures are unremarkable. IMPRESSION: No active cardiopulmonary disease. Electronically Signed   By: Ulyses Jarred M.D.   On: 06/15/2018 01:06   Ct Abdomen Pelvis W Contrast  Result Date: 06/15/2018 CLINICAL DATA:  Abdominal pain EXAM: CT ABDOMEN AND PELVIS WITH CONTRAST TECHNIQUE: Multidetector CT imaging of the abdomen and pelvis was performed using the standard protocol following bolus administration of intravenous contrast. CONTRAST:  1109mL OMNIPAQUE IOHEXOL 300 MG/ML  SOLN COMPARISON:  None. FINDINGS: LOWER CHEST: Ground-glass opacities in both lung bases. HEPATOBILIARY: Diffuse hypoattenuation of the liver relative to the spleen suggests hepatic steatosis. No focal liver lesion or biliary dilatation. Status post cholecystectomy. PANCREAS: The pancreatic parenchymal contours are normal and there is no ductal dilatation. There is no peripancreatic fluid collection. SPLEEN: Normal. ADRENALS/URINARY TRACT: --Adrenal glands: Normal. --Right kidney/ureter: No hydronephrosis, nephroureterolithiasis, perinephric stranding or solid renal mass. --Left kidney/ureter: No hydronephrosis, nephroureterolithiasis, perinephric stranding or solid renal mass. --Urinary bladder: Normal for degree of distention STOMACH/BOWEL: --Stomach/Duodenum: There is no hiatal hernia or other gastric abnormality. The duodenal course and caliber are normal. --Small bowel: No dilatation or inflammation. --Colon: No focal abnormality. --Appendix: Surgically absent. VASCULAR/LYMPHATIC: There is aortic atherosclerosis without hemodynamically significant stenosis. No abdominal or pelvic lymphadenopathy. REPRODUCTIVE: Status post hysterectomy. No adnexal mass. MUSCULOSKELETAL. No bony spinal  canal stenosis or focal osseous abnormality. OTHER: None. IMPRESSION: 1. No acute abdominal or pelvic abnormality. 2. Bibasilar ground-glass pulmonary opacities, which may indicate pulmonary edema. 3.  Aortic atherosclerosis (ICD10-I70.0).  A Electronically Signed   By: Ulyses Jarred M.D.   On: 06/15/2018 02:27   US Abdomen Limited Ruq  Result Date: 06/15/2018 CLINICAL DATA:  Abdominal pain EXAM: ULTRASOUND ABDOMEN LIMITED RIGHT UPPER QUADRANT COMPARISON:  CT 06/15/2018 FINDINGS: Gallbladder: Surgically absent Common bile duct: Diameter: Not clearly identified Liver: Echogenic liver without focal hepatic abnormality. Portal vein is patent on color Doppler imaging with normal direction of blood flow towards the liver. IMPRESSION: 1. Status post cholecystectomy. Common bile duct was not clearly identified, secondary to habitus 2. Echogenic liver consistent with steatosis and or hepatocellular disease. Electronically Signed   By: Donavan Foil M.D.   On: 06/15/2018 03:23    EKG: Independently reviewed.  Assessment/Plan Principal Problem:   Lactic acidosis Active Problems:   Benign essential HTN   COPD (chronic obstructive pulmonary disease) (HCC)   Chronic diastolic congestive heart failure (HCC)   Diabetes (HCC)   Transaminitis   Epigastric pain    1. Lactic acidosis, abdominal pain and TTP - 1. DDx includes: 2. Dehydration - quite possible, hopefully they get that 2nd L NS in ASAP so we can obtain repeat lactate and see where patient stands 3. Sepsis - 1. Doubtful given how patient looks on exam: non-toxic, not in acute distress, BP 155/100, no WBC, Tm 99 2. None the less, will leave on rocephin / flagyl for the moment to cover intra-abdominal infections just in case. 3. SBP with no ascites also seems less likely 4. Checking influenza - though very minimal URI symptoms and cough 4. Mesenteric ischemia - really doubt this, I would have expected to see SOMETHING on the CT abd/pelvis after 5  days of symptoms now. 5. Perforated ulcer - again, very doubtful after 5 day history of symptoms.  Negative CT too.  1. Will convert PPI to IV 6. Metformin associated lactic acidosis - possible though rare, hold metformin and use SSI for the moment 7. Diabetic gastroparesis - possible, but only occasional vomiting at this point. 8. 2L NS in ED (got 1 so far) and 125 cc/hr NS ordered 9. NPO except meds and ice chips for the moment. 10. PRN morphine for pain 11. Depending on results of repeat studies, may warrant GI consult in AM 2. Transaminates - 1. Acute hepatitis pnl 2. [place] not really remarkable 3. Repeat CMP ordered for this morning 3. HTN - 1. Continue clonidine 2. Continue losartan 4. Chronic diastolic CHF - 1. Hydrating as above 2. Holding lasix and potassium 3. Will check BNP - did question pulm edema on CT, though no overt edema on CXR, and lactate 6.1 so trying hydration currently. 4. Trop neg - will check serial, but again, 5 day h/o symptoms 5. Q waves in lead 3 6. Tele monitor as well 5. COPD - continue home nebs 6. DM2 - 1. Holding metformin 2. Sensitive SSI Q4H  DVT prophylaxis: Heparin [place] Code Status: Full Family Communication: No family in room Disposition Plan: Home after admit Consults called: None Admission status: Place in 41  Adama Ferber, [facility]  How to contact the [facility] Attending or Consulting provider [place] or covering provider during after hours [place], for this patient?  1. Check the care team in [facility] and look for a) attending/consulting [facility] provider listed and b) the [facility] team listed 2. Log into [email or web address]  Amion Physician Scheduling and messaging for groups and whole hospitals  On call and physician scheduling software for group practices, residents, hospitalists and other medical providers for call, clinic, rotation and shift schedules. On[employer] is a hospital-wide system for scheduling doctors and paging doctors on  call. EasyPlot is for scientific plotting and data analysis.  [email or web address]  and use [place]'s universal password to access. If you do not have the password, please contact the hospital operator.  3. Locate the [facility] provider you are looking for under [facility] and page to a number that you can be directly reached. 4. If you still have difficulty reaching the provider, please page the [facility] (Director on Call) for the Hospitalists listed on amion for assistance.  06/15/2018, 4:46 AM

## 2018-06-15 NOTE — ED Provider Notes (Signed)
[facility] EMERGENCY DEPARTMENT Provider Note   CSN: [id number] Arrival date & time: 06/14/18  1723    History   Chief Complaint Chief Complaint  Patient presents with  . Abdominal Pain    HPI Tammy Strickland is a 58 y.o. female.     58 yo F with a chief complaint of epigastric and right upper quadrant abdominal pain.  This been going on for the past 5 days.  Denies fevers or chills has had some nausea and some difficulty eating but has only had a couple episodes of vomiting.  Denies diarrhea.  Feels that her abdomen is more bloated than normal.  She is also been having upper respiratory infection cough congestion going on for the past week or so.  Pain improves with holding pressure on her upper abdomen.  Nothing else seems to make it better or worse.  She has a history of an MI and thinks this feels different.  Feels a gurgling in her stomach.  Has had a prior cholecystectomy appendectomy and hysterectomy.  The history is provided by the patient.  Abdominal Pain  Pain location:  Epigastric and RUQ Pain quality: sharp and shooting   Pain radiates to:  Back Pain severity:  Severe Onset quality:  Sudden Duration:  5 days Timing:  Constant Progression:  Worsening Chronicity:  New Relieved by:  Nothing (pressure on the abdomen) Worsened by:  Nothing Ineffective treatments:  None tried Associated symptoms: cough, nausea and vomiting   Associated symptoms: no chest pain, no chills, no dysuria, no fever and no shortness of breath     Past Medical History:  Diagnosis Date  . Anxiety   . Arthritis   . CHF (congestive heart failure) ([place])   . COPD (chronic obstructive pulmonary disease) ([place])   . Coronary artery disease   . Depression   . Diabetes mellitus without complication ([place])    type 2   . GERD (gastroesophageal reflux disease)   . Hypertension   . Lupus ([place])   . Migraines   . Neuromuscular disorder ([place])   . Osteoporosis   . Oxygen deficiency      pt uses 2.5L 02 at night   . Peripheral neuropathy   . Shingles   . Sleep apnea    had sleep study done recently ; unaware if she will be getting  a CPAP device ; patient states "im pretty sure i have it , i fall alseep all the time "  . Stroke [facility]) 10/2015    Patient Active Problem List   Diagnosis Date Noted  . Lactic acidosis 06/15/2018  . Transaminitis 06/15/2018  . Epigastric pain 06/15/2018  . Chronic bilateral low back pain without sciatica 05/17/2018  . Body mass index 50.0-59.9, adult ([place]) 05/17/2018  . Acute bronchitis with COPD ([place]) 05/12/2018  . Boil 05/12/2018  . It band syndrome, left 12/29/2017  . Trochanteric bursitis, left hip 12/29/2017  . Cough 08/05/2017  . Dysphagia 04/29/2017  . History of left knee replacement 04/21/2017  . Pain in left ankle and joints of left foot 04/21/2017  . Pain in left hand 04/21/2017  . OSA (obstructive sleep apnea) 02/23/2017  . Acute gouty arthritis 12/25/2016  . Stroke ([place])   . Chronic fatigue 11/09/2016  . Hypoxia 11/09/2016  . Morbidly obese ([place]) 11/09/2016  . Rash and nonspecific skin eruption 07/21/2016  . Frequent headaches 07/21/2016  . Allergic rhinitis 07/16/2016  . Vitamin D deficiency 04/16/2016  . Hyperlipidemia 01/15/2016  . Gait  disorder 12/04/2015  . Diabetes ([place]) 11/26/2015  . Chronic diastolic congestive heart failure ([place]) 11/06/2015  . TIA (transient ischemic attack)   . Carotid-cavernous fistula   . Chest pain 10/31/2015  . Insomnia 10/10/2015  . Depression 10/10/2015  . Benign essential HTN 08/15/2015  . Fibromyalgia 08/15/2015  . Peripheral neuropathy 08/15/2015  . CAD (coronary artery disease) 08/15/2015  . COPD (chronic obstructive pulmonary disease) ([place]) 08/15/2015  . Osteoporosis 08/15/2015  . 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 DDD (degenerative disc disease), lumbar 08/15/2015  . Occupational exposure to industrial toxins 08/15/2015  . GERD (gastroesophageal reflux disease) 08/15/2015     Past Surgical History:  Procedure Laterality Date  . ABDOMINAL HYSTERECTOMY     ovaries left  . APPENDECTOMY    . CHOLECYSTECTOMY    . IR GENERIC HISTORICAL  11/07/2015   IR ANGIO INTRA EXTRACRAN SEL COM CAROTID INNOMINATE BILAT MOD SED 11/07/2015 Luanne Bras, MD [facility]  . IR GENERIC HISTORICAL  11/07/2015   IR ANGIO VERTEBRAL SEL SUBCLAVIAN INNOMINATE UNI R MOD SED 11/07/2015 Luanne Bras, MD [facility]  . IR GENERIC HISTORICAL  11/07/2015   IR ANGIO VERTEBRAL SEL VERTEBRAL UNI L MOD SED 11/07/2015 Luanne Bras, MD [facility]  . IR GENERIC HISTORICAL  11/07/2015   IR ANGIOGRAM EXTREMITY LEFT 11/07/2015 Luanne Bras, MD [facility]  . TONSILLECTOMY    . TOTAL KNEE ARTHROPLASTY Left 03/12/2017   Procedure: LEFT TOTAL KNEE ARTHROPLASTY;  Surgeon: Mcarthur Rossetti, MD;  Location: [facility];  Service: Orthopedics;  Laterality: Left;  Adductor Block     OB History    Gravida  3   Para      Term      Preterm      AB      Living  3     SAB      TAB      Ectopic      Multiple      Live Births               Home Medications    Prior to Admission medications   Medication Sig Start Date End Date Taking? Authorizing Provider  amitriptyline (ELAVIL) 50 MG tablet Take 1 tablet by mouth at bedtime 04/21/18  Yes Burns, Claudina Lick, MD  aspirin EC 81 MG tablet Take 1 tablet (81 mg total) by mouth daily. 11/01/15  Yes Elgergawy, Silver Huguenin, MD  atorvastatin (LIPITOR) 20 MG tablet TAKE 1 TABLET(20 MG) BY MOUTH DAILY Patient taking differently: Take 20 mg by mouth every morning.  05/11/18  Yes Burns, Claudina Lick, MD  cloNIDine (CATAPRES) 0.3 MG tablet Take 1 tablet (0.3 mg total) by mouth daily. 07/07/17  Yes Burns, Claudina Lick, MD  fluticasone (FLONASE) 50 MCG/ACT nasal spray Shake liquid & use 1 spray into each nostril every day as needed for allergies 03/21/18  Yes Burns, Claudina Lick, MD  furosemide (LASIX) 80 MG tablet Take 1 tablet (80 mg total) by mouth  daily. 07/07/17  Yes Burns, Claudina Lick, MD  gabapentin (NEURONTIN) 600 MG tablet Take 2 tablets by mouth 3 times a day Patient taking differently: Take 1,200 mg by mouth 2 (two) times daily.  04/20/18  Yes Burns, Claudina Lick, MD  glucose blood test strip Use as instructed to check sugar three times a day 01/28/17  Yes Burns, Claudina Lick, MD  ipratropium-albuterol (DUONEB) 0.5-2.5 (3) MG/3ML SOLN Take 3 mLs by nebulization every 4 (four) hours as needed. Patient taking differently: Take 3  mLs by nebulization every 4 (four) hours as needed (SOB).  05/26/18  Yes Raylene Everts, MD  Lancets MISC Use as directed twice per day 01/28/17  Yes Burns, Claudina Lick, MD  losartan (COZAAR) 50 MG tablet Take 1 tablet (50 mg total) by mouth 2 (two) times daily. 07/07/17  Yes Burns, Claudina Lick, MD  metFORMIN (GLUCOPHAGE-XR) 500 MG 24 hr tablet Take 2 tablets by mouth twice daily with a meal 04/20/18  Yes Burns, Claudina Lick, MD  metoprolol (TOPROL-XL) 200 MG 24 hr tablet Take 1 tablet (200 mg total) by mouth 2 (two) times daily. 07/07/17  Yes Burns, Claudina Lick, MD  mometasone (NASONEX) 50 MCG/ACT nasal spray Place 2 sprays into the nose daily. Patient taking differently: Place 2 sprays into the nose daily as needed (allergies).  01/04/18  Yes Burns, Claudina Lick, MD  nitroGLYCERIN (NITROSTAT) 0.4 MG SL tablet Place 0.4 mg under the tongue every 5 (five) minutes as needed for chest pain.   Yes [provider]  omeprazole (PRILOSEC) 40 MG capsule Take 1 capsule (40 mg total) by mouth daily. 09/21/17  Yes Burns, Claudina Lick, MD  oxyCODONE-acetaminophen (PERCOCET) 10-325 MG tablet Take 1 tablet by mouth every 6 (six) hours as needed for pain.  01/31/18  Yes [provider]  potassium chloride SA (K-DUR,KLOR-CON) 20 MEQ tablet Take 1 tablet (20 mEq total) by mouth daily. 07/07/17  Yes Burns, Claudina Lick, MD  SYMBICORT 80-4.5 MCG/ACT inhaler Inhale 2 Puffs by mouth twice daily Patient taking differently: Inhale 2 puffs into the lungs daily as needed  (SOB).  12/28/17  Yes Burns, Claudina Lick, MD  tiZANidine (ZANAFLEX) 4 MG tablet TAKE FOUR TABLETS BY MOUTH TWICE DAILY Patient taking differently: Take 10 mg by mouth 2 (two) times daily.  05/26/18  Yes Burns, Claudina Lick, MD  predniSONE (DELTASONE) 20 MG tablet Take 1 tablet (20 mg total) by mouth 2 (two) times daily with a meal. Patient not taking: Reported on 06/15/2018 05/26/18   Raylene Everts, MD    Family History Family History  Problem Relation Age of Onset  . Hyperlipidemia Mother   . Hypertension Mother   . Stroke Mother   . Thyroid disease Mother   . Heart attack Mother   . Hyperlipidemia Father   . Hypertension Father   . Stroke Father   . Heart attack Father   . Hypertension Sister   . Cancer Sister        breast cancer  . Stroke Sister   . Thyroid disease Sister   . Crohn's disease Sister   . Hypertension Sister   . Hypertension Brother   . Diabetes Brother   . Hypertension Brother     Social History Social History   Tobacco Use  . Smoking status: Current Every Day Smoker    Packs/day: 0.25    Years: 35.00    Pack years: 8.75    Types: Cigarettes  . Smokeless tobacco: Never Used  . Tobacco comment: referred  to smoking  cessation  classes. at  [facility]   Substance Use Topics  . Alcohol use: No  . Drug use: No     Allergies   Bacitracin   Review of Systems Review of Systems  Constitutional: Negative for chills and fever.  HENT: Positive for congestion. Negative for rhinorrhea.   Eyes: Negative for redness and visual disturbance.  Respiratory: Positive for cough. Negative for shortness of breath and wheezing.   Cardiovascular: Negative for chest pain and palpitations.  Gastrointestinal: Positive for abdominal pain, nausea and vomiting.  Genitourinary: Negative for dysuria and urgency.  Musculoskeletal: Negative for arthralgias and myalgias.  Skin: Negative for pallor and wound.  Neurological: Negative for dizziness and headaches.     Physical  Exam Updated Vital Signs BP (!) 158/105 (BP Location: Right Arm)   Pulse 86   Temp 99.2 F (37.3 C) (Oral)   Resp (!) 24   SpO2 98%   Physical Exam Vitals signs and nursing note reviewed.  Constitutional:      General: She is not in acute distress.    Appearance: She is well-developed. She is obese. She is not diaphoretic.  HENT:     Head: Normocephalic and atraumatic.  Eyes:     Pupils: Pupils are equal, round, and reactive to light.  Neck:     Musculoskeletal: Normal range of motion and neck supple.  Cardiovascular:     Rate and Rhythm: Normal rate and regular rhythm.     Heart sounds: No murmur. No friction rub. No gallop.   Pulmonary:     Effort: Pulmonary effort is normal.     Breath sounds: No wheezing or rales.  Abdominal:     General: There is distension.     Palpations: Abdomen is soft.     Tenderness: There is abdominal tenderness.     Comments: Exquisitely tender to the epigastrium and right upper quadrant  Musculoskeletal:        General: No tenderness.  Skin:    General: Skin is warm and dry.  Neurological:     Mental Status: She is alert and oriented to person, place, and time.  Psychiatric:        Behavior: Behavior normal.      ED Treatments / Results  Labs (all labs ordered are listed, but only abnormal results are displayed) Labs Reviewed  COMPREHENSIVE METABOLIC PANEL - Abnormal; Notable for the following components:      Result Value   Chloride 97 (*)    Glucose, Bld 226 (*)    Calcium 8.4 (*)    AST 78 (*)    ALT 65 (*)    Alkaline Phosphatase 136 (*)    Anion gap 16 (*)    All other components within normal limits  URINALYSIS, ROUTINE W REFLEX MICROSCOPIC - Abnormal; Notable for the following components:   Specific Gravity, Urine >1.046 (*)    Glucose, UA >=500 (*)    Hgb urine dipstick SMALL (*)    Ketones, ur 5 (*)    Bacteria, UA RARE (*)    All other components within normal limits  LACTIC ACID, PLASMA - Abnormal; Notable for  the following components:   Lactic Acid, Venous 6.1 (*)    All other components within normal limits  COMPREHENSIVE METABOLIC PANEL - Abnormal; Notable for the following components:   Chloride 97 (*)    Glucose, Bld 251 (*)    Calcium 8.1 (*)    Albumin 3.4 (*)    AST 67 (*)    ALT 58 (*)    Alkaline Phosphatase 131 (*)    All other components within normal limits  LACTIC ACID, PLASMA - Abnormal; Notable for the following components:   Lactic Acid, Venous 4.3 (*)    All other components within normal limits  CULTURE, BLOOD (ROUTINE X 2)  CULTURE, BLOOD (ROUTINE X 2)  LIPASE, BLOOD  CBC  D-DIMER, QUANTITATIVE (NOT AT [facility])  CBC  LACTIC ACID, PLASMA  INFLUENZA PANEL BY PCR (TYPE  A & B)  LACTIC ACID, PLASMA  HEPATITIS PANEL, ACUTE  HIV ANTIBODY (ROUTINE TESTING W REFLEX)  BRAIN NATRIURETIC PEPTIDE  TROPONIN I  TROPONIN I  TROPONIN I  I-STAT TROPONIN, ED    EKG None  Radiology Dg Chest 2 View  Result Date: 06/15/2018 CLINICAL DATA:  Right chest pain EXAM: CHEST - 2 VIEW COMPARISON:  08/05/2017 FINDINGS: The heart size and mediastinal contours are within normal limits. Both lungs are clear. The visualized skeletal structures are unremarkable. IMPRESSION: No active cardiopulmonary disease. Electronically Signed   By: Ulyses Jarred M.D.   On: 06/15/2018 01:06   Ct Abdomen Pelvis W Contrast  Result Date: 06/15/2018 CLINICAL DATA:  Abdominal pain EXAM: CT ABDOMEN AND PELVIS WITH CONTRAST TECHNIQUE: Multidetector CT imaging of the abdomen and pelvis was performed using the standard protocol following bolus administration of intravenous contrast. CONTRAST:  150mL OMNIPAQUE IOHEXOL 300 MG/ML  SOLN COMPARISON:  None. FINDINGS: LOWER CHEST: Ground-glass opacities in both lung bases. HEPATOBILIARY: Diffuse hypoattenuation of the liver relative to the spleen suggests hepatic steatosis. No focal liver lesion or biliary dilatation. Status post cholecystectomy. PANCREAS: The pancreatic  parenchymal contours are normal and there is no ductal dilatation. There is no peripancreatic fluid collection. SPLEEN: Normal. ADRENALS/URINARY TRACT: --Adrenal glands: Normal. --Right kidney/ureter: No hydronephrosis, nephroureterolithiasis, perinephric stranding or solid renal mass. --Left kidney/ureter: No hydronephrosis, nephroureterolithiasis, perinephric stranding or solid renal mass. --Urinary bladder: Normal for degree of distention STOMACH/BOWEL: --Stomach/Duodenum: There is no hiatal hernia or other gastric abnormality. The duodenal course and caliber are normal. --Small bowel: No dilatation or inflammation. --Colon: No focal abnormality. --Appendix: Surgically absent. VASCULAR/LYMPHATIC: There is aortic atherosclerosis without hemodynamically significant stenosis. No abdominal or pelvic lymphadenopathy. REPRODUCTIVE: Status post hysterectomy. No adnexal mass. MUSCULOSKELETAL. No bony spinal canal stenosis or focal osseous abnormality. OTHER: None. IMPRESSION: 1. No acute abdominal or pelvic abnormality. 2. Bibasilar ground-glass pulmonary opacities, which may indicate pulmonary edema. 3.  Aortic atherosclerosis (ICD10-I70.0).  A Electronically Signed   By: Ulyses Jarred M.D.   On: 06/15/2018 02:27   US Abdomen Limited Ruq  Result Date: 06/15/2018 CLINICAL DATA:  Abdominal pain EXAM: ULTRASOUND ABDOMEN LIMITED RIGHT UPPER QUADRANT COMPARISON:  CT 06/15/2018 FINDINGS: Gallbladder: Surgically absent Common bile duct: Diameter: Not clearly identified Liver: Echogenic liver without focal hepatic abnormality. Portal vein is patent on color Doppler imaging with normal direction of blood flow towards the liver. IMPRESSION: 1. Status post cholecystectomy. Common bile duct was not clearly identified, secondary to habitus 2. Echogenic liver consistent with steatosis and or hepatocellular disease. Electronically Signed   By: Donavan Foil M.D.   On: 06/15/2018 03:23    Procedures Procedures (including  critical care time)  Medications Ordered in ED Medications  sodium chloride flush (NS) 0.9 % injection 3 mL (has no administration in time range)  0.9 %  sodium chloride infusion ( Intravenous New Bag/Given 06/15/18 0631)  pantoprazole (PROTONIX) injection 40 mg (has no administration in time range)  metroNIDAZOLE (FLAGYL) IVPB 500 mg (has no administration in time range)  cefTRIAXone (ROCEPHIN) 2 g in sodium chloride 0.9 % 100 mL IVPB (0 g Intravenous Stopped 06/15/18 0530)  morphine 2 MG/ML injection 2-4 mg (has no administration in time range)  acetaminophen (TYLENOL) tablet 650 mg (has no administration in time range)    Or  acetaminophen (TYLENOL) suppository 650 mg (has no administration in time range)  ondansetron (ZOFRAN) tablet 4 mg (has no administration in time range)    Or  ondansetron (ZOFRAN) injection  4 mg (has no administration in time range)  heparin injection 5,000 Units (has no administration in time range)  cloNIDine (CATAPRES) tablet 0.3 mg (has no administration in time range)  gabapentin (NEURONTIN) capsule 1,200 mg (has no administration in time range)  ipratropium-albuterol (DUONEB) 0.5-2.5 (3) MG/3ML nebulizer solution 3 mL (has no administration in time range)  losartan (COZAAR) tablet 50 mg (has no administration in time range)  metoprolol succinate (TOPROL-XL) 24 hr tablet 200 mg (has no administration in time range)  mometasone-formoterol (DULERA) 100-5 MCG/ACT inhaler 2 puff (has no administration in time range)  atorvastatin (LIPITOR) tablet 20 mg (has no administration in time range)  aspirin EC tablet 81 mg (has no administration in time range)  amitriptyline (ELAVIL) tablet 50 mg (has no administration in time range)  insulin aspart (novoLOG) injection 0-9 Units (0 Units Subcutaneous Hold 06/15/18 0640)  sodium chloride 0.9 % bolus 1,000 mL (1,000 mLs Intravenous New Bag/Given 06/15/18 0250)  morphine 4 MG/ML injection 4 mg (4 mg Intravenous Given 06/15/18 0248)   ondansetron (ZOFRAN) injection 4 mg (4 mg Intravenous Given 06/15/18 0246)  alum & mag hydroxide-simeth (MAALOX/MYLANTA) 200-200-20 MG/5ML suspension 15 mL (15 mLs Oral Given 06/15/18 0246)  iohexol (OMNIPAQUE) 300 MG/ML solution 100 mL (100 mLs Intravenous Contrast Given 06/15/18 0203)  sodium chloride 0.9 % bolus 1,000 mL (1,000 mLs Intravenous New Bag/Given 06/15/18 0450)  acetaminophen (TYLENOL) tablet 1,000 mg (1,000 mg Oral Given 06/15/18 0444)  metroNIDAZOLE (FLAGYL) IVPB 500 mg (500 mg Intravenous New Bag/Given 06/15/18 0450)     Initial Impression / Assessment and Plan / ED Course  I have reviewed the triage vital signs and the nursing notes.  Pertinent labs & imaging results that were available during my care of the patient were reviewed by me and considered in my medical decision making (see chart for details).        58 yo F with a chief complaint of upper abdominal pain.  Going on for the past 5 or 6 days.  No fevers per the patient.  Patient appears somewhat uncomfortable.  She has significant tenderness diffusely about the upper abdomen but worse in the right upper quadrant.  Her LFTs are mildly elevated but will consistent with her past values.  She has an anion gap of 16, will add on a lactic acid.  Lipase is normal.  Give a bolus of fluids pain medicine.  She has a history of lupus and she has been coughing and has some pleuritic pain.  Will obtain a d-dimer.  Chest x-ray to evaluate for pneumonia.  Chest x-ray viewed by me without focal infiltrate.  D-dimer is negative.  CT scan of the abdomen pelvis without acute pathology.  On reassessment the patient is still having ongoing severe abdominal tenderness.  Her lactic acid is elevated at 6.1.  She is not endorsing fevers or chills.  Will obtain a right upper quadrant ultrasound to try and better evaluate the common bile duct.  They were unable to visualize this on ultrasound.  With the patient having ongoing pain and a lactate of 6.1 we  will give antibiotics for possible intra-abdominal pathology and discussed with the hospitalist for possible admission.  CRITICAL CARE Performed by: Cecilio Asper   Total critical care time: 80 minutes  Critical care time was exclusive of separately billable procedures and treating other patients.  Critical care was necessary to treat or prevent imminent or life-threatening deterioration.  Critical care was time spent personally by me on  the following activities: development of treatment plan with patient and/or surrogate as well as nursing, discussions with consultants, evaluation of patient's response to treatment, examination of patient, obtaining history from patient or surrogate, ordering and performing treatments and interventions, ordering and review of laboratory studies, ordering and review of radiographic studies, pulse oximetry and re-evaluation of patient's condition.  The patients results and plan were reviewed and discussed.   Any x-rays performed were independently reviewed by myself.   Differential diagnosis were considered with the presenting HPI.  Medications  sodium chloride flush (NS) 0.9 % injection 3 mL (has no administration in time range)  0.9 %  sodium chloride infusion ( Intravenous New Bag/Given 06/15/18 0631)  pantoprazole (PROTONIX) injection 40 mg (has no administration in time range)  metroNIDAZOLE (FLAGYL) IVPB 500 mg (has no administration in time range)  cefTRIAXone (ROCEPHIN) 2 g in sodium chloride 0.9 % 100 mL IVPB (0 g Intravenous Stopped 06/15/18 0530)  morphine 2 MG/ML injection 2-4 mg (has no administration in time range)  acetaminophen (TYLENOL) tablet 650 mg (has no administration in time range)    Or  acetaminophen (TYLENOL) suppository 650 mg (has no administration in time range)  ondansetron (ZOFRAN) tablet 4 mg (has no administration in time range)    Or  ondansetron (ZOFRAN) injection 4 mg (has no administration in time range)  heparin  injection 5,000 Units (has no administration in time range)  cloNIDine (CATAPRES) tablet 0.3 mg (has no administration in time range)  gabapentin (NEURONTIN) capsule 1,200 mg (has no administration in time range)  ipratropium-albuterol (DUONEB) 0.5-2.5 (3) MG/3ML nebulizer solution 3 mL (has no administration in time range)  losartan (COZAAR) tablet 50 mg (has no administration in time range)  metoprolol succinate (TOPROL-XL) 24 hr tablet 200 mg (has no administration in time range)  mometasone-formoterol (DULERA) 100-5 MCG/ACT inhaler 2 puff (has no administration in time range)  atorvastatin (LIPITOR) tablet 20 mg (has no administration in time range)  aspirin EC tablet 81 mg (has no administration in time range)  amitriptyline (ELAVIL) tablet 50 mg (has no administration in time range)  insulin aspart (novoLOG) injection 0-9 Units (0 Units Subcutaneous Hold 06/15/18 0640)  sodium chloride 0.9 % bolus 1,000 mL (1,000 mLs Intravenous New Bag/Given 06/15/18 0250)  morphine 4 MG/ML injection 4 mg (4 mg Intravenous Given 06/15/18 0248)  ondansetron (ZOFRAN) injection 4 mg (4 mg Intravenous Given 06/15/18 0246)  alum & mag hydroxide-simeth (MAALOX/MYLANTA) 200-200-20 MG/5ML suspension 15 mL (15 mLs Oral Given 06/15/18 0246)  iohexol (OMNIPAQUE) 300 MG/ML solution 100 mL (100 mLs Intravenous Contrast Given 06/15/18 0203)  sodium chloride 0.9 % bolus 1,000 mL (1,000 mLs Intravenous New Bag/Given 06/15/18 0450)  acetaminophen (TYLENOL) tablet 1,000 mg (1,000 mg Oral Given 06/15/18 0444)  metroNIDAZOLE (FLAGYL) IVPB 500 mg (500 mg Intravenous New Bag/Given 06/15/18 0450)    Vitals:   06/15/18 0430 06/15/18 0445 06/15/18 0538 06/15/18 0622  BP:   (!) 146/84 (!) 158/105  Pulse: 97 93 87 86  Resp: 16 (!) 22 (!) 22 (!) 24  Temp:    99.2 F (37.3 C)  TempSrc:    Oral  SpO2: 100% 100% 100% 98%    Final diagnoses:  Abdominal pain, RUQ  Lactic acidosis    Admission/ observation were discussed with the admitting  physician, patient and/or family and they are comfortable with the plan.   Final Clinical Impressions(s) / ED Diagnoses   Final diagnoses:  Abdominal pain, RUQ  Lactic acidosis    ED  Discharge Orders    None       Deno Etienne, [place] 06/15/18 [phone]

## 2018-06-16 ENCOUNTER — Observation Stay (HOSPITAL_COMMUNITY): Payer: Medicare Other

## 2018-06-16 DIAGNOSIS — Z90710 Acquired absence of both cervix and uterus: Secondary | ICD-10-CM | POA: Diagnosis not present

## 2018-06-16 DIAGNOSIS — G629 Polyneuropathy, unspecified: Secondary | ICD-10-CM | POA: Diagnosis present

## 2018-06-16 DIAGNOSIS — I509 Heart failure, unspecified: Secondary | ICD-10-CM | POA: Diagnosis not present

## 2018-06-16 DIAGNOSIS — Z6841 Body Mass Index (BMI) 40.0 and over, adult: Secondary | ICD-10-CM | POA: Diagnosis not present

## 2018-06-16 DIAGNOSIS — I252 Old myocardial infarction: Secondary | ICD-10-CM | POA: Diagnosis not present

## 2018-06-16 DIAGNOSIS — I2510 Atherosclerotic heart disease of native coronary artery without angina pectoris: Secondary | ICD-10-CM | POA: Diagnosis present

## 2018-06-16 DIAGNOSIS — F17210 Nicotine dependence, cigarettes, uncomplicated: Secondary | ICD-10-CM | POA: Diagnosis present

## 2018-06-16 DIAGNOSIS — Z7982 Long term (current) use of aspirin: Secondary | ICD-10-CM | POA: Diagnosis not present

## 2018-06-16 DIAGNOSIS — M810 Age-related osteoporosis without current pathological fracture: Secondary | ICD-10-CM | POA: Diagnosis present

## 2018-06-16 DIAGNOSIS — Z7951 Long term (current) use of inhaled steroids: Secondary | ICD-10-CM | POA: Diagnosis not present

## 2018-06-16 DIAGNOSIS — Z7984 Long term (current) use of oral hypoglycemic drugs: Secondary | ICD-10-CM | POA: Diagnosis not present

## 2018-06-16 DIAGNOSIS — J449 Chronic obstructive pulmonary disease, unspecified: Secondary | ICD-10-CM | POA: Diagnosis not present

## 2018-06-16 DIAGNOSIS — E872 Acidosis: Secondary | ICD-10-CM | POA: Diagnosis not present

## 2018-06-16 DIAGNOSIS — K219 Gastro-esophageal reflux disease without esophagitis: Secondary | ICD-10-CM | POA: Diagnosis present

## 2018-06-16 DIAGNOSIS — I10 Essential (primary) hypertension: Secondary | ICD-10-CM | POA: Diagnosis not present

## 2018-06-16 DIAGNOSIS — I110 Hypertensive heart disease with heart failure: Secondary | ICD-10-CM | POA: Diagnosis present

## 2018-06-16 DIAGNOSIS — E1143 Type 2 diabetes mellitus with diabetic autonomic (poly)neuropathy: Secondary | ICD-10-CM | POA: Diagnosis present

## 2018-06-16 DIAGNOSIS — F329 Major depressive disorder, single episode, unspecified: Secondary | ICD-10-CM | POA: Diagnosis present

## 2018-06-16 DIAGNOSIS — F419 Anxiety disorder, unspecified: Secondary | ICD-10-CM | POA: Diagnosis present

## 2018-06-16 DIAGNOSIS — R935 Abnormal findings on diagnostic imaging of other abdominal regions, including retroperitoneum: Secondary | ICD-10-CM | POA: Diagnosis not present

## 2018-06-16 DIAGNOSIS — E860 Dehydration: Secondary | ICD-10-CM | POA: Diagnosis present

## 2018-06-16 DIAGNOSIS — Z8673 Personal history of transient ischemic attack (TIA), and cerebral infarction without residual deficits: Secondary | ICD-10-CM | POA: Diagnosis not present

## 2018-06-16 DIAGNOSIS — I5032 Chronic diastolic (congestive) heart failure: Secondary | ICD-10-CM | POA: Diagnosis not present

## 2018-06-16 DIAGNOSIS — K760 Fatty (change of) liver, not elsewhere classified: Secondary | ICD-10-CM | POA: Diagnosis not present

## 2018-06-16 DIAGNOSIS — Z96652 Presence of left artificial knee joint: Secondary | ICD-10-CM | POA: Diagnosis present

## 2018-06-16 DIAGNOSIS — K3184 Gastroparesis: Secondary | ICD-10-CM | POA: Diagnosis present

## 2018-06-16 DIAGNOSIS — E119 Type 2 diabetes mellitus without complications: Secondary | ICD-10-CM | POA: Diagnosis not present

## 2018-06-16 LAB — GLUCOSE, CAPILLARY
Glucose-Capillary: 212 mg/dL — ABNORMAL HIGH (ref 70–99)
Glucose-Capillary: 230 mg/dL — ABNORMAL HIGH (ref 70–99)
Glucose-Capillary: 234 mg/dL — ABNORMAL HIGH (ref 70–99)
Glucose-Capillary: 250 mg/dL — ABNORMAL HIGH (ref 70–99)
Glucose-Capillary: 251 mg/dL — ABNORMAL HIGH (ref 70–99)
Glucose-Capillary: 254 mg/dL — ABNORMAL HIGH (ref 70–99)

## 2018-06-16 LAB — COMPREHENSIVE METABOLIC PANEL
ALT: 53 U/L — ABNORMAL HIGH (ref 0–44)
AST: 80 U/L — ABNORMAL HIGH (ref 15–41)
Albumin: 3.1 g/dL — ABNORMAL LOW (ref 3.5–5.0)
Alkaline Phosphatase: 127 U/L — ABNORMAL HIGH (ref 38–126)
Anion gap: 12 (ref 5–15)
BUN: <5 mg/dL — ABNORMAL LOW (ref 6–20)
CO2: 26 mmol/L (ref 22–32)
Calcium: 8.2 mg/dL — ABNORMAL LOW (ref 8.9–10.3)
Chloride: 100 mmol/L (ref 98–111)
Creatinine, Ser: 0.84 mg/dL (ref 0.44–1.00)
GFR calc Af Amer: >60 mL/min (ref >60)
GFR calc non Af Amer: >60 mL/min (ref >60)
Glucose, Bld: 227 mg/dL — ABNORMAL HIGH (ref 70–99)
Potassium: 4.4 mmol/L (ref 3.5–5.1)
Sodium: 138 mmol/L (ref 135–145)
Total Bilirubin: 0.7 mg/dL (ref 0.3–1.2)
Total Protein: 6.6 g/dL (ref 6.5–8.1)

## 2018-06-16 LAB — LACTIC ACID, PLASMA: Lactic Acid, Venous: 2.4 mmol/L (ref 0.5–1.9)

## 2018-06-16 LAB — HEPATITIS PANEL, ACUTE
HCV Ab: <0.1 s/co ratio (ref 0.0–0.9)
Hep A IgM: NEGATIVE
Hep B C IgM: NEGATIVE
Hepatitis B Surface Ag: NEGATIVE

## 2018-06-16 MED ORDER — GADOBUTROL 1 MMOL/ML IV SOLN
10.0000 mL | Freq: Once | INTRAVENOUS | Status: AC | PRN
  Administered 2018-06-16: 10 mL via INTRAVENOUS

## 2018-06-16 MED ORDER — DICLOFENAC SODIUM 1 % TD GEL
2.0000 g | Freq: Four times a day (QID) | TRANSDERMAL | Status: DC
  Administered 2018-06-16 (×3): 2 g via TOPICAL
  Filled 2018-06-16: qty 100

## 2018-06-16 MED ORDER — INSULIN ASPART 100 UNIT/ML ~~LOC~~ SOLN
0.0000 [IU] | Freq: Three times a day (TID) | SUBCUTANEOUS | Status: DC
  Administered 2018-06-16: 8 [IU] via SUBCUTANEOUS

## 2018-06-16 MED ORDER — OXYCODONE-ACETAMINOPHEN 5-325 MG PO TABS
1.0000 | ORAL_TABLET | Freq: Four times a day (QID) | ORAL | Status: DC | PRN
  Administered 2018-06-16 – 2018-06-17 (×2): 1 via ORAL
  Filled 2018-06-16 (×3): qty 1

## 2018-06-16 NOTE — Progress Notes (Signed)
PROGRESS NOTE    Tammy Strickland  XBJ:[id number] DOB: [birth year] DOA: 06/14/2018 PCP: Binnie Rail, MD   Brief Narrative:  HPI On 06/15/2018 by Dr. Jennette Kettle Tammy Strickland is a 59 y.o. female with medical history significant of DM2, HTN, COPD, chronic diastolic CHF.  Patient presents to the ED with c/o epigastric and RUQ abd pain, nausea, occasional vomiting, no diarrhea.  Symptoms ongoing for the past 5 days and are constant.  Abd pain worse when she coughs.  Also has cough and URI symptoms.  No fevers nor chills.  No dysuria, no back or flank pain.  Abd pain feels better if she holds abdomen while coughing. Assessment & Plan   Lactic acidosis with abdominal pain -likely due to dehydration -lactic acid was 6.1 on admission, currently down to 3.8 -Influenza PCR unremarkable, UA and CXR unremarkable for infection -Blood cultures pending -Anion gap WNL, -CBGs 207-261 -RUQ [place]; S/p cholecystectomy. CBD not clearly identified, secondary to habitus. Echogenic liver consistent with steatosis and or hepatocellular disease -CT abdomen pelvis showed no acute abdominal or pelvic abnormality -Metformin held -?  Gastroparesis, however patient with no vomiting at this point and would like to eat -was advanced to heart healthy diet and tolerated well -Continue pain control -patient placed on ceftriaxone for intra-abdominal coverage -Given increase in LFTs and continued RUQ pain, MRCP obtained  Transaminitis -Likely secondary to fatty liver noted on ultrasound -mildly elevated today- see discussion above -Continue IVF and monitor CMP -Discontinued atorvastatin  Essential hypertension -Continue clonidine, losartan  Chronic diastolic heart failure -Lasix currently held -Appears to be euvolemic and compensated -BNP 28.4, trop negative -Monitor intake and output, daily weights closely as patient is receiving IV fluids for lactic acidosis  COPD -Continue home medications, currently  stable  Diabetes mellitus, type II -Metformin held -Continue insulin sliding scale with CBG monitoring  DVT Prophylaxis heparin  Code Status: Full  Family Communication: None at bedside  Disposition Plan: Observation.  Suspect home within 1 to 2 days pending improvement- MRCP  Consultants Gastroenterology, via phone   Procedures  RUQ ultrasound  Antibiotics   Anti-infectives (From admission, onward)   Start     Dose/Rate Route Frequency Ordered Stop   06/15/18 1200  metroNIDAZOLE (FLAGYL) IVPB 500 mg     500 mg 100 mL/hr over 60 Minutes Intravenous Every 8 hours 06/15/18 0425     06/15/18 0500  cefTRIAXone (ROCEPHIN) 2 g in sodium chloride 0.9 % 100 mL IVPB     2 g 200 mL/hr over 30 Minutes Intravenous Daily 06/15/18 0425     06/15/18 0345  cefTRIAXone (ROCEPHIN) 1 g in sodium chloride 0.9 % 100 mL IVPB  Status:  Discontinued     1 g 200 mL/hr over 30 Minutes Intravenous  Once 06/15/18 0338 06/15/18 0432   06/15/18 0345  metroNIDAZOLE (FLAGYL) IVPB 500 mg     500 mg 100 mL/hr over 60 Minutes Intravenous  Once 06/15/18 0338 06/15/18 0550      Subjective:   Tammy Strickland seen and examined today.  Continues to have abdominal pain, but able to tolerate diet without nausea, vomiting. Denies chest pain, shortness of breath. Feels pain is located under her right breast, RUQ abdomen.   Objective:   Vitals:   06/16/18 0544 06/16/18 0547 06/16/18 0735 06/16/18 1254  BP: 136/80  (!) 144/71 94/67  Pulse: 69  69 61  Resp:      Temp: 98.1 F (36.7 C)  98.3 F (36.8 C)  TempSrc: Oral  Oral   SpO2: 94%  92% 93%  Weight:  (!) 142.2 kg      Intake/Output Summary (Last 24 hours) at 06/16/2018 1427 Last data filed at 06/15/2018 1900 Gross per 24 hour  Intake 520.62 ml  Output -  Net 520.62 ml   Filed Weights   06/16/18 0547  Weight: (!) 142.2 kg   Exam  General: Well developed, well nourished, NAD, appears stated age  50: NCAT, mucous membranes moist.   Neck:  Supple  Cardiovascular: S1 S2 auscultated, RRR  Respiratory: Clear to auscultation bilaterally with equal chest rise  Abdomen: Soft, obese, epigastric/RUQ TTP, nondistended, +bowel sounds  Extremities: warm dry without cyanosis clubbing or edema  Neuro: AAOx3, nonfocal  Psych: Normal affect and demeanor   Data Reviewed: I have personally reviewed following labs and imaging studies  CBC: Recent Labs  Lab 06/14/18 1851 06/15/18 0426  WBC 8.7 7.7  HGB 13.1 12.1  HCT 41.5 38.8  MCV 85.2 84.5  PLT 277 573   Basic Metabolic Panel: Recent Labs  Lab 06/14/18 1851 06/15/18 0426 06/16/18 0517  NA 137 135 138  K 3.6 3.6 4.4  CL 97* 97* 100  CO2 24 24 26   GLUCOSE 226* 251* 227*  BUN 7 7 <5*  CREATININE 0.91 0.81 0.84  CALCIUM 8.4* 8.1* 8.2*   GFR: Estimated Creatinine Clearance: 108.4 mL/min (by C-G formula based on SCr of 0.84 mg/dL). Liver Function Tests: Recent Labs  Lab 06/14/18 1851 06/15/18 0426 06/16/18 0517  AST 78* 67* 80*  ALT 65* 58* 53*  ALKPHOS 136* 131* 127*  BILITOT 0.6 0.6 0.7  PROT 7.2 6.8 6.6  ALBUMIN 3.6 3.4* 3.1*   Recent Labs  Lab 06/14/18 1851  LIPASE 18   No results for input(s): AMMONIA in the last 168 hours. Coagulation Profile: No results for input(s): INR, PROTIME in the last 168 hours. Cardiac Enzymes: Recent Labs  Lab 06/15/18 0650 06/15/18 1117 06/15/18 1655  TROPONINI <0.03 <0.03 <0.03   BNP (last 3 results) No results for input(s): PROBNP in the last 8760 hours. HbA1C: No results for input(s): HGBA1C in the last 72 hours. CBG: Recent Labs  Lab 06/15/18 2106 06/16/18 0042 06/16/18 0540 06/16/18 0729 06/16/18 1249  GLUCAP 262* 251* 234* 212* 230*   Lipid Profile: No results for input(s): CHOL, HDL, LDLCALC, TRIG, CHOLHDL, LDLDIRECT in the last 72 hours. Thyroid Function Tests: No results for input(s): TSH, T4TOTAL, FREET4, T3FREE, THYROIDAB in the last 72 hours. Anemia Panel: No results for input(s):  VITAMINB12, FOLATE, FERRITIN, TIBC, IRON, RETICCTPCT in the last 72 hours. Urine analysis:    Component Value Date/Time   COLORURINE YELLOW 06/15/2018 0537   APPEARANCEUR CLEAR 06/15/2018 0537   LABSPEC >1.046 (H) 06/15/2018 0537   PHURINE 6.0 06/15/2018 0537   GLUCOSEU >=500 (A) 06/15/2018 0537   GLUCOSEU >=1000 (A) 07/21/2016 1039   HGBUR SMALL (A) 06/15/2018 0537   BILIRUBINUR NEGATIVE 06/15/2018 0537   BILIRUBINUR neg 02/26/2017 1112   KETONESUR 5 (A) 06/15/2018 0537   PROTEINUR NEGATIVE 06/15/2018 0537   UROBILINOGEN 0.2 02/26/2017 1112   UROBILINOGEN 0.2 07/21/2016 1039   NITRITE NEGATIVE 06/15/2018 0537   LEUKOCYTESUR NEGATIVE 06/15/2018 0537   Sepsis Labs: @LABRCNTIP (procalcitonin:4,lacticidven:4)  ) Recent Results (from the past 240 hour(s))  Blood culture (routine x 2)     Status: None (Preliminary result)   Collection Time: 06/15/18  4:15 AM  Result Value Ref Range Status   Specimen Description BLOOD RIGHT ARM  Final  Special Requests   Final    BOTTLES DRAWN AEROBIC AND ANAEROBIC Blood Culture adequate volume   Culture   Final    NO GROWTH 1 DAY Performed at [facility], [place] [address] [place], [place] [address]    Report Status PENDING  Incomplete  Blood culture (routine x 2)     Status: None (Preliminary result)   Collection Time: 06/15/18  4:30 AM  Result Value Ref Range Status   Specimen Description BLOOD LEFT HAND  Final   Special Requests   Final    BOTTLES DRAWN AEROBIC ONLY Blood Culture results may not be optimal due to an excessive volume of blood received in culture bottles   Culture   Final    NO GROWTH 1 DAY Performed at [facility], [place] [address] [place], [place] [address]    Report Status PENDING  Incomplete      Radiology Studies: Dg Chest 2 View  Result Date: 06/15/2018 CLINICAL DATA:  Right chest pain EXAM: CHEST - 2 VIEW COMPARISON:  08/05/2017 FINDINGS: The heart size and mediastinal contours are within normal  limits. Both lungs are clear. The visualized skeletal structures are unremarkable. IMPRESSION: No active cardiopulmonary disease. Electronically Signed   By: Ulyses Jarred M.D.   On: 06/15/2018 01:06   Ct Abdomen Pelvis W Contrast  Result Date: 06/15/2018 CLINICAL DATA:  Abdominal pain EXAM: CT ABDOMEN AND PELVIS WITH CONTRAST TECHNIQUE: Multidetector CT imaging of the abdomen and pelvis was performed using the standard protocol following bolus administration of intravenous contrast. CONTRAST:  178mL OMNIPAQUE IOHEXOL 300 MG/ML  SOLN COMPARISON:  None. FINDINGS: LOWER CHEST: Ground-glass opacities in both lung bases. HEPATOBILIARY: Diffuse hypoattenuation of the liver relative to the spleen suggests hepatic steatosis. No focal liver lesion or biliary dilatation. Status post cholecystectomy. PANCREAS: The pancreatic parenchymal contours are normal and there is no ductal dilatation. There is no peripancreatic fluid collection. SPLEEN: Normal. ADRENALS/URINARY TRACT: --Adrenal glands: Normal. --Right kidney/ureter: No hydronephrosis, nephroureterolithiasis, perinephric stranding or solid renal mass. --Left kidney/ureter: No hydronephrosis, nephroureterolithiasis, perinephric stranding or solid renal mass. --Urinary bladder: Normal for degree of distention STOMACH/BOWEL: --Stomach/Duodenum: There is no hiatal hernia or other gastric abnormality. The duodenal course and caliber are normal. --Small bowel: No dilatation or inflammation. --Colon: No focal abnormality. --Appendix: Surgically absent. VASCULAR/LYMPHATIC: There is aortic atherosclerosis without hemodynamically significant stenosis. No abdominal or pelvic lymphadenopathy. REPRODUCTIVE: Status post hysterectomy. No adnexal mass. MUSCULOSKELETAL. No bony spinal canal stenosis or focal osseous abnormality. OTHER: None. IMPRESSION: 1. No acute abdominal or pelvic abnormality. 2. Bibasilar ground-glass pulmonary opacities, which may indicate pulmonary edema. 3.   Aortic atherosclerosis (ICD10-I70.0).  A Electronically Signed   By: Ulyses Jarred M.D.   On: 06/15/2018 02:27   Mr 3d Recon At Scanner  Result Date: 06/16/2018 CLINICAL DATA:  Inpatient. COPD. CHF. Admitted with several days of epigastric and right upper quadrant abdominal pain, nausea and vomiting. Prior cholecystectomy. EXAM: MRI ABDOMEN WITHOUT AND WITH CONTRAST (INCLUDING MRCP) TECHNIQUE: Multiplanar multisequence MR imaging of the abdomen was performed both before and after the administration of intravenous contrast. Heavily T2-weighted images of the biliary and pancreatic ducts were obtained, and three-dimensional MRCP images were rendered by post processing. CONTRAST:  10 cc Gadavist IV. COMPARISON:  06/15/2018 right upper quadrant abdominal sonogram and CT abdomen/pelvis. FINDINGS: Significantly motion degraded scan, limiting assessment. Lower chest: No acute abnormality at the lung bases. Hepatobiliary: Mild hepatomegaly. No definite liver surface irregularity. Severe diffuse hepatic steatosis. No liver  mass. Cholecystectomy. No biliary ductal dilatation. Common bile duct diameter 5 mm. No biliary filling defects, masses or strictures. Pancreas: No pancreatic mass or duct dilation.  No pancreas divisum. Spleen: Normal size. No mass. Adrenals/Urinary Tract: Normal adrenals. No hydronephrosis. Normal kidneys with no renal mass. Stomach/Bowel: Normal non-distended stomach. Visualized small and large bowel is normal caliber, with no bowel wall thickening. Vascular/Lymphatic: Atherosclerotic nonaneurysmal abdominal aorta. Patent portal, splenic, hepatic and renal veins. No pathologically enlarged lymph nodes in the abdomen. Other: No abdominal ascites or focal fluid collection. Musculoskeletal: No aggressive appearing focal osseous lesions. IMPRESSION: 1. Severe diffuse hepatic steatosis. Mild hepatomegaly. No overt morphologic changes of cirrhosis. No liver masses. 2. Cholecystectomy. No biliary ductal  dilatation. No evidence of choledocholithiasis. 3.  Aortic Atherosclerosis (ICD10-I70.0). Electronically Signed   By: Ilona Sorrel M.D.   On: 06/16/2018 14:25   Mr Abdomen Mrcp Moise Boring Contast  Result Date: 06/16/2018 CLINICAL DATA:  Inpatient. COPD. CHF. Admitted with several days of epigastric and right upper quadrant abdominal pain, nausea and vomiting. Prior cholecystectomy. EXAM: MRI ABDOMEN WITHOUT AND WITH CONTRAST (INCLUDING MRCP) TECHNIQUE: Multiplanar multisequence MR imaging of the abdomen was performed both before and after the administration of intravenous contrast. Heavily T2-weighted images of the biliary and pancreatic ducts were obtained, and three-dimensional MRCP images were rendered by post processing. CONTRAST:  10 cc Gadavist IV. COMPARISON:  06/15/2018 right upper quadrant abdominal sonogram and CT abdomen/pelvis. FINDINGS: Significantly motion degraded scan, limiting assessment. Lower chest: No acute abnormality at the lung bases. Hepatobiliary: Mild hepatomegaly. No definite liver surface irregularity. Severe diffuse hepatic steatosis. No liver mass. Cholecystectomy. No biliary ductal dilatation. Common bile duct diameter 5 mm. No biliary filling defects, masses or strictures. Pancreas: No pancreatic mass or duct dilation.  No pancreas divisum. Spleen: Normal size. No mass. Adrenals/Urinary Tract: Normal adrenals. No hydronephrosis. Normal kidneys with no renal mass. Stomach/Bowel: Normal non-distended stomach. Visualized small and large bowel is normal caliber, with no bowel wall thickening. Vascular/Lymphatic: Atherosclerotic nonaneurysmal abdominal aorta. Patent portal, splenic, hepatic and renal veins. No pathologically enlarged lymph nodes in the abdomen. Other: No abdominal ascites or focal fluid collection. Musculoskeletal: No aggressive appearing focal osseous lesions. IMPRESSION: 1. Severe diffuse hepatic steatosis. Mild hepatomegaly. No overt morphologic changes of cirrhosis. No  liver masses. 2. Cholecystectomy. No biliary ductal dilatation. No evidence of choledocholithiasis. 3.  Aortic Atherosclerosis (ICD10-I70.0). Electronically Signed   By: Ilona Sorrel M.D.   On: 06/16/2018 14:25   US Abdomen Limited Ruq  Result Date: 06/15/2018 CLINICAL DATA:  Abdominal pain EXAM: ULTRASOUND ABDOMEN LIMITED RIGHT UPPER QUADRANT COMPARISON:  CT 06/15/2018 FINDINGS: Gallbladder: Surgically absent Common bile duct: Diameter: Not clearly identified Liver: Echogenic liver without focal hepatic abnormality. Portal vein is patent on color Doppler imaging with normal direction of blood flow towards the liver. IMPRESSION: 1. Status post cholecystectomy. Common bile duct was not clearly identified, secondary to habitus 2. Echogenic liver consistent with steatosis and or hepatocellular disease. Electronically Signed   By: Donavan Foil M.D.   On: 06/15/2018 03:23     Scheduled Meds: . amitriptyline  50 mg Oral QHS  . aspirin EC  81 mg Oral Daily  . cloNIDine  0.3 mg Oral Daily  . gabapentin  1,200 mg Oral BID  . heparin  5,000 Units Subcutaneous Q8H  . insulin aspart  0-9 Units Subcutaneous Q4H  . losartan  50 mg Oral BID  . metoprolol  200 mg Oral BID  . mometasone-formoterol  2 puff Inhalation  BID  . pantoprazole (PROTONIX) IV  40 mg Intravenous Q12H  . sodium chloride flush  3 mL Intravenous Once  . tiZANidine  10 mg Oral BID   Continuous Infusions: . sodium chloride Stopped (06/15/18 1340)  . cefTRIAXone (ROCEPHIN)  IV 2 g (06/16/18 0837)  . metronidazole 500 mg (06/16/18 0356)     LOS: 0 days   Time Spent in minutes   30 minutes  Inman Fettig D.O. on 06/16/2018 at 2:27 PM  Between 7am to 7pm - Please see pager noted on [email or web address]  After 7pm go to [email or web address]  And look for the night coverage person covering for me after hours  [facility] Office  [phone]

## 2018-06-16 NOTE — Care Management Obs Status (Signed)
[facility] NOTIFICATION   Patient Details  Name: Tammy Strickland MRN: [id number] Date of Birth: 05/25/1960   Medicare Observation Status Notification Given:  Yes    Amos, Gaber, RN 06/16/2018, 10:24 AM

## 2018-06-16 NOTE — Progress Notes (Signed)
Inpatient Diabetes Program Recommendations  AACE/ADA: New Consensus Statement on Inpatient Glycemic Control (2015)  Target Ranges:  Prepandial:   less than 140 mg/dL      Peak postprandial:   less than 180 mg/dL (1-2 hours)      Critically ill patients:  140 - 180 mg/dL   Results for MARELY, APGAR (MRN [id number]) as of 06/16/2018 09:50  Ref. Range 06/15/2018 08:05 06/15/2018 11:23 06/15/2018 17:07 06/15/2018 21:06 06/16/2018 00:42 06/16/2018 05:40 06/16/2018 07:29  Glucose-Capillary Latest Ref Range: 70 - 99 mg/dL 261 (H) 245 (H) 301 (H) 262 (H) 251 (H) 234 (H) 212 (H)   Review of Glycemic Control  Diabetes history: DM 2 Outpatient Diabetes medications: Metformin 1000 mg BID Current orders for Inpatient glycemic control: Novolog 0-9 units Q4 hours  A1c 9.1% on 1/30  Inpatient Diabetes Program Recommendations:    Patient with lactic acidosis and on Metformin 1000 mg BID at home. Patient may need alternative oral DM med at time of d/c. Glucose trends in the 200 range. Patient may benefit from Novolog moderate correction 0-15 units Q4 hours. May also need low dose basal insulin, Lantus 14 units (0.1units/kg). Renal function WNL.  Thanks,  Tama Headings RN, MSN, BC-ADM Inpatient Diabetes Coordinator Team Pager [phone] (8a-5p)

## 2018-06-16 NOTE — Discharge Summary (Signed)
Physician Discharge Summary  Tierre Gerard DUK:[id number] DOB: [birth year] DOA: 06/14/2018  PCP: Binnie Rail, MD  Admit date: 06/14/2018 Discharge date: 06/17/2018  Time spent: 45 minutes  Recommendations for Outpatient Follow-up:  Patient will be discharged to home.  Patient will need to follow up with primary care provider within one week of discharge, repeat CMP and lactic acid.  Patient should continue medications as prescribed.  Patient should follow a heart healthy/carb modified diet.   Discharge Diagnoses:  Principal Problem: Lactic acidosis with abdominal pain Transaminitis Essential hypertension Chronic diastolic heart failure COPD Diabetes mellitus, type II  Discharge Condition: stable  Diet recommendation: heart healthy/carb modified  Filed Weights   06/16/18 0547 06/17/18 0442  Weight: (!) 142.2 kg (!) 145.2 kg    History of present illness:  On 06/15/2018 by Dr. Tinnie Gens McBrydeis a 58 y.o.femalewith medical history significant ofDM2, HTN, COPD, chronic diastolic CHF.  Patient presents to the ED with c/o epigastric and RUQ abd pain, nausea, occasional vomiting, no diarrhea. Symptoms ongoing for the past 5 days and are constant. Abd pain worse when she coughs. Also has cough and URI symptoms. No fevers nor chills. No dysuria, no back or flank pain.  Abd pain feels better if she holds abdomen while coughing.  Hospital Course:  Lactic acidosis with abdominal pain -likely due to dehydration vs metformin -lactic acid was 6.1 on admission, currently down to 2.5 (patient refused further testing this morning) -Influenza PCR unremarkable, UA and CXR unremarkable for infection -Blood cultures pending -Anion gap WNL -CBGs 207-261 -RUQ [place]; S/p cholecystectomy. CBD not clearly identified, secondary to habitus. Echogenic liver consistent with steatosis and or hepatocellular disease -CT abdomen pelvis showed no acute abdominal or pelvic  abnormality -Metformin held -?  Gastroparesis, however patient with no vomiting at this point and would like to eat -was advanced to heart healthy diet and tolerated well -Continue pain control -patient placed on ceftriaxone for intra-abdominal coverage -Given increase in LFTs and continued RUQ pain -MRCP: severe diffuse hepatic steatosis.  No overt morphological changes of cirrhosis.  No liver masses.  No biliary ductal dilatation or evidence of choledocholithiasis  Transaminitis -Likely secondary to fatty liver noted on ultrasound -improved -Discontinued atorvastatin- restart when LFTs have normalized -discussed discontinuing Zanaflex with patient, she does not want to discontinue it, but will reduce her dose  Essential hypertension -Continue clonidine, losartan  Chronic diastolic heart failure -Lasix currently held- may restart on discharge -Appears to be euvolemic and compensated -BNP 28.4, trop negative -Monitor intake and output, daily weights closely as patient is receiving IV fluids for lactic acidosis  COPD -Continue home medications, currently stable  Diabetes mellitus, type II -Metformin held- given lactic acidosis- metformin held -started patient on glipizide -hemoglobin A1c 9.1 (prior were in 6 range) -Discussed with PCP, she will follow up with patient on 06/20/2018 at 10am and discuss further -had extensive conversation regarding weight, diet, exercise with patient, she would benefit from nutritionist consultation as an outpatient  Morbid obesity -Discussed weight, diet, exercise -follow up with PCP  Consultants Gastroenterology, via phone   Procedures  RUQ ultrasound  Discharge Exam: Vitals:   06/16/18 2035 06/17/18 0442  BP: 132/90 (!) 147/96  Pulse: 65 (!) 59  Resp: 19   Temp: 98.2 F (36.8 C)   SpO2:  94%     General: Well developed, well nourished, NAD, appears stated age  HEENT: NCAT, mucous membranes moist.  Cardiovascular: S1 S2  auscultated, Regular rate and rhythm.  Respiratory:  Clear to auscultation bilaterally with equal chest rise  Abdomen: Soft, obese, mild RUQ TTP (improved), nondistended, + bowel sounds  Extremities: warm dry without cyanosis clubbing or edema  Neuro: AAOx3, nonfocal  Psych: normal affect and demeaner  Discharge Instructions Discharge Instructions    Discharge instructions   Complete by:  As directed    Patient will be discharged to home.  Patient will need to follow up with primary care provider within one week of discharge, repeat CMP and lactic acid.  Patient should continue medications as prescribed.  Patient should follow a heart healthy/carb modified diet.   Discuss diabetes control. If possible discontinue tizanidine. Hold Lipitor (atorvastatin) until your liver enzymes have improved or are instructed to restart by your primary doctor.     Allergies as of 06/17/2018      Reactions   Bacitracin Hives      Medication List    STOP taking these medications   metFORMIN 500 MG 24 hr tablet Commonly known as:  GLUCOPHAGE-XR   predniSONE 20 MG tablet Commonly known as:  Deltasone     TAKE these medications   amitriptyline 50 MG tablet Commonly known as:  ELAVIL Take 1 tablet by mouth at bedtime   aspirin EC 81 MG tablet Take 1 tablet (81 mg total) by mouth daily.   atorvastatin 20 MG tablet Commonly known as:  LIPITOR Hold until instructed to restart by your primary doctor What changed:  See the new instructions.   cloNIDine 0.3 MG tablet Commonly known as:  CATAPRES Take 1 tablet (0.3 mg total) by mouth daily.   diclofenac sodium 1 % Gel Commonly known as:  VOLTAREN Apply 2 g topically 4 (four) times daily as needed.   fluticasone 50 MCG/ACT nasal spray Commonly known as:  FLONASE Shake liquid & use 1 spray into each nostril every day as needed for allergies   furosemide 80 MG tablet Commonly known as:  LASIX Take 1 tablet (80 mg total) by mouth daily.    gabapentin 600 MG tablet Commonly known as:  NEURONTIN Take 2 tablets by mouth 3 times a day What changed:  when to take this   glipiZIDE 5 MG tablet Commonly known as:  GLUCOTROL Take 1 tablet (5 mg total) by mouth 2 (two) times daily for 30 days.   glucose blood test strip Use as instructed to check sugar three times a day   ipratropium-albuterol 0.5-2.5 (3) MG/3ML Soln Commonly known as:  DUONEB Take 3 mLs by nebulization every 4 (four) hours as needed. What changed:  reasons to take this   Lancets Misc Use as directed twice per day   losartan 50 MG tablet Commonly known as:  COZAAR Take 1 tablet (50 mg total) by mouth 2 (two) times daily.   metoprolol 200 MG 24 hr tablet Commonly known as:  TOPROL-XL Take 1 tablet (200 mg total) by mouth 2 (two) times daily.   mometasone 50 MCG/ACT nasal spray Commonly known as:  Nasonex Place 2 sprays into the nose daily. What changed:    when to take this  reasons to take this   nitroGLYCERIN 0.4 MG SL tablet Commonly known as:  NITROSTAT Place 0.4 mg under the tongue every 5 (five) minutes as needed for chest pain.   omeprazole 40 MG capsule Commonly known as:  PRILOSEC Take 1 capsule (40 mg total) by mouth daily.   oxyCODONE-acetaminophen 10-325 MG tablet Commonly known as:  PERCOCET Take 1 tablet by mouth every 6 (six) hours  as needed for pain.   potassium chloride SA 20 MEQ tablet Commonly known as:  K-DUR,KLOR-CON Take 1 tablet (20 mEq total) by mouth daily.   Symbicort 80-4.5 MCG/ACT inhaler Generic drug:  budesonide-formoterol Inhale 2 Puffs by mouth twice daily What changed:    when to take this  reasons to take this   tiZANidine 4 MG tablet Commonly known as:  ZANAFLEX TAKE FOUR TABLETS BY MOUTH TWICE DAILY What changed:    how much to take  how to take this  when to take this  additional instructions      Allergies  Allergen Reactions  . Bacitracin Hives   Follow-up Information     Binnie Rail, MD. Go on 06/20/2018.   Specialty:  Internal Medicine Why:  at 10am Contact information: [place] [address] [phone]            The results of significant diagnostics from this hospitalization (including imaging, microbiology, ancillary and laboratory) are listed below for reference.    Significant Diagnostic Studies: Dg Chest 2 View  Result Date: 06/15/2018 CLINICAL DATA:  Right chest pain EXAM: CHEST - 2 VIEW COMPARISON:  08/05/2017 FINDINGS: The heart size and mediastinal contours are within normal limits. Both lungs are clear. The visualized skeletal structures are unremarkable. IMPRESSION: No active cardiopulmonary disease. Electronically Signed   By: Ulyses Jarred M.D.   On: 06/15/2018 01:06   Ct Abdomen Pelvis W Contrast  Result Date: 06/15/2018 CLINICAL DATA:  Abdominal pain EXAM: CT ABDOMEN AND PELVIS WITH CONTRAST TECHNIQUE: Multidetector CT imaging of the abdomen and pelvis was performed using the standard protocol following bolus administration of intravenous contrast. CONTRAST:  159mL OMNIPAQUE IOHEXOL 300 MG/ML  SOLN COMPARISON:  None. FINDINGS: LOWER CHEST: Ground-glass opacities in both lung bases. HEPATOBILIARY: Diffuse hypoattenuation of the liver relative to the spleen suggests hepatic steatosis. No focal liver lesion or biliary dilatation. Status post cholecystectomy. PANCREAS: The pancreatic parenchymal contours are normal and there is no ductal dilatation. There is no peripancreatic fluid collection. SPLEEN: Normal. ADRENALS/URINARY TRACT: --Adrenal glands: Normal. --Right kidney/ureter: No hydronephrosis, nephroureterolithiasis, perinephric stranding or solid renal mass. --Left kidney/ureter: No hydronephrosis, nephroureterolithiasis, perinephric stranding or solid renal mass. --Urinary bladder: Normal for degree of distention STOMACH/BOWEL: --Stomach/Duodenum: There is no hiatal hernia or other gastric abnormality. The duodenal course  and caliber are normal. --Small bowel: No dilatation or inflammation. --Colon: No focal abnormality. --Appendix: Surgically absent. VASCULAR/LYMPHATIC: There is aortic atherosclerosis without hemodynamically significant stenosis. No abdominal or pelvic lymphadenopathy. REPRODUCTIVE: Status post hysterectomy. No adnexal mass. MUSCULOSKELETAL. No bony spinal canal stenosis or focal osseous abnormality. OTHER: None. IMPRESSION: 1. No acute abdominal or pelvic abnormality. 2. Bibasilar ground-glass pulmonary opacities, which may indicate pulmonary edema. 3.  Aortic atherosclerosis (ICD10-I70.0).  A Electronically Signed   By: Ulyses Jarred M.D.   On: 06/15/2018 02:27   Mr 3d Recon At Scanner  Result Date: 06/16/2018 CLINICAL DATA:  Inpatient. COPD. CHF. Admitted with several days of epigastric and right upper quadrant abdominal pain, nausea and vomiting. Prior cholecystectomy. EXAM: MRI ABDOMEN WITHOUT AND WITH CONTRAST (INCLUDING MRCP) TECHNIQUE: Multiplanar multisequence MR imaging of the abdomen was performed both before and after the administration of intravenous contrast. Heavily T2-weighted images of the biliary and pancreatic ducts were obtained, and three-dimensional MRCP images were rendered by post processing. CONTRAST:  10 cc Gadavist IV. COMPARISON:  06/15/2018 right upper quadrant abdominal sonogram and CT abdomen/pelvis. FINDINGS: Significantly motion degraded scan, limiting assessment. Lower chest: No acute  abnormality at the lung bases. Hepatobiliary: Mild hepatomegaly. No definite liver surface irregularity. Severe diffuse hepatic steatosis. No liver mass. Cholecystectomy. No biliary ductal dilatation. Common bile duct diameter 5 mm. No biliary filling defects, masses or strictures. Pancreas: No pancreatic mass or duct dilation.  No pancreas divisum. Spleen: Normal size. No mass. Adrenals/Urinary Tract: Normal adrenals. No hydronephrosis. Normal kidneys with no renal mass. Stomach/Bowel: Normal  non-distended stomach. Visualized small and large bowel is normal caliber, with no bowel wall thickening. Vascular/Lymphatic: Atherosclerotic nonaneurysmal abdominal aorta. Patent portal, splenic, hepatic and renal veins. No pathologically enlarged lymph nodes in the abdomen. Other: No abdominal ascites or focal fluid collection. Musculoskeletal: No aggressive appearing focal osseous lesions. IMPRESSION: 1. Severe diffuse hepatic steatosis. Mild hepatomegaly. No overt morphologic changes of cirrhosis. No liver masses. 2. Cholecystectomy. No biliary ductal dilatation. No evidence of choledocholithiasis. 3.  Aortic Atherosclerosis (ICD10-I70.0). Electronically Signed   By: Ilona Sorrel M.D.   On: 06/16/2018 14:25   Mr Abdomen Mrcp Moise Boring Contast  Result Date: 06/16/2018 CLINICAL DATA:  Inpatient. COPD. CHF. Admitted with several days of epigastric and right upper quadrant abdominal pain, nausea and vomiting. Prior cholecystectomy. EXAM: MRI ABDOMEN WITHOUT AND WITH CONTRAST (INCLUDING MRCP) TECHNIQUE: Multiplanar multisequence MR imaging of the abdomen was performed both before and after the administration of intravenous contrast. Heavily T2-weighted images of the biliary and pancreatic ducts were obtained, and three-dimensional MRCP images were rendered by post processing. CONTRAST:  10 cc Gadavist IV. COMPARISON:  06/15/2018 right upper quadrant abdominal sonogram and CT abdomen/pelvis. FINDINGS: Significantly motion degraded scan, limiting assessment. Lower chest: No acute abnormality at the lung bases. Hepatobiliary: Mild hepatomegaly. No definite liver surface irregularity. Severe diffuse hepatic steatosis. No liver mass. Cholecystectomy. No biliary ductal dilatation. Common bile duct diameter 5 mm. No biliary filling defects, masses or strictures. Pancreas: No pancreatic mass or duct dilation.  No pancreas divisum. Spleen: Normal size. No mass. Adrenals/Urinary Tract: Normal adrenals. No hydronephrosis. Normal  kidneys with no renal mass. Stomach/Bowel: Normal non-distended stomach. Visualized small and large bowel is normal caliber, with no bowel wall thickening. Vascular/Lymphatic: Atherosclerotic nonaneurysmal abdominal aorta. Patent portal, splenic, hepatic and renal veins. No pathologically enlarged lymph nodes in the abdomen. Other: No abdominal ascites or focal fluid collection. Musculoskeletal: No aggressive appearing focal osseous lesions. IMPRESSION: 1. Severe diffuse hepatic steatosis. Mild hepatomegaly. No overt morphologic changes of cirrhosis. No liver masses. 2. Cholecystectomy. No biliary ductal dilatation. No evidence of choledocholithiasis. 3.  Aortic Atherosclerosis (ICD10-I70.0). Electronically Signed   By: Ilona Sorrel M.D.   On: 06/16/2018 14:25   US Abdomen Limited Ruq  Result Date: 06/15/2018 CLINICAL DATA:  Abdominal pain EXAM: ULTRASOUND ABDOMEN LIMITED RIGHT UPPER QUADRANT COMPARISON:  CT 06/15/2018 FINDINGS: Gallbladder: Surgically absent Common bile duct: Diameter: Not clearly identified Liver: Echogenic liver without focal hepatic abnormality. Portal vein is patent on color Doppler imaging with normal direction of blood flow towards the liver. IMPRESSION: 1. Status post cholecystectomy. Common bile duct was not clearly identified, secondary to habitus 2. Echogenic liver consistent with steatosis and or hepatocellular disease. Electronically Signed   By: Donavan Foil M.D.   On: 06/15/2018 03:23    Microbiology: Recent Results (from the past 240 hour(s))  Blood culture (routine x 2)     Status: None (Preliminary result)   Collection Time: 06/15/18  4:15 AM  Result Value Ref Range Status   Specimen Description BLOOD RIGHT ARM  Final   Special Requests   Final    BOTTLES  DRAWN AEROBIC AND ANAEROBIC Blood Culture adequate volume   Culture   Final    NO GROWTH 1 DAY Performed at [facility], [place] [address], [place], [place] [address]    Report Status PENDING  Incomplete   Blood culture (routine x 2)     Status: None (Preliminary result)   Collection Time: 06/15/18  4:30 AM  Result Value Ref Range Status   Specimen Description BLOOD LEFT HAND  Final   Special Requests   Final    BOTTLES DRAWN AEROBIC ONLY Blood Culture results may not be optimal due to an excessive volume of blood received in culture bottles   Culture   Final    NO GROWTH 1 DAY Performed at [facility], [place] [address], [place], [place] [address]    Report Status PENDING  Incomplete     Labs: Basic Metabolic Panel: Recent Labs  Lab 06/14/18 1851 06/15/18 0426 06/16/18 0517 06/17/18 0311  NA 137 135 138 135  K 3.6 3.6 4.4 4.5  CL 97* 97* 100 104  CO2 24 24 26 25   GLUCOSE 226* 251* 227* 268*  BUN 7 7 <5* <5*  CREATININE 0.91 0.81 0.84 0.74  CALCIUM 8.4* 8.1* 8.2* 8.2*   Liver Function Tests: Recent Labs  Lab 06/14/18 1851 06/15/18 0426 06/16/18 0517 06/17/18 0311  AST 78* 67* 80* 71*  ALT 65* 58* 53* 49*  ALKPHOS 136* 131* 127* 109  BILITOT 0.6 0.6 0.7 0.4  PROT 7.2 6.8 6.6 5.9*  ALBUMIN 3.6 3.4* 3.1* 2.9*   Recent Labs  Lab 06/14/18 1851  LIPASE 18   No results for input(s): AMMONIA in the last 168 hours. CBC: Recent Labs  Lab 06/14/18 1851 06/15/18 0426  WBC 8.7 7.7  HGB 13.1 12.1  HCT 41.5 38.8  MCV 85.2 84.5  PLT 277 269   Cardiac Enzymes: Recent Labs  Lab 06/15/18 0650 06/15/18 1117 06/15/18 1655  TROPONINI <0.03 <0.03 <0.03   BNP: BNP (last 3 results) Recent Labs    06/15/18 0650  BNP 28.4    ProBNP (last 3 results) No results for input(s): PROBNP in the last 8760 hours.  CBG: Recent Labs  Lab 06/16/18 0540 06/16/18 0729 06/16/18 1249 06/16/18 1644 06/16/18 2121  GLUCAP 234* 212* 230* 254* 250*       Signed:  Keonia Pasko  [facility] 06/17/2018, 8:28 AM

## 2018-06-17 ENCOUNTER — Telehealth: Payer: Self-pay | Admitting: *Deleted

## 2018-06-17 LAB — COMPREHENSIVE METABOLIC PANEL
ALT: 49 U/L — ABNORMAL HIGH (ref 0–44)
AST: 71 U/L — ABNORMAL HIGH (ref 15–41)
Albumin: 2.9 g/dL — ABNORMAL LOW (ref 3.5–5.0)
Alkaline Phosphatase: 109 U/L (ref 38–126)
Anion gap: 6 (ref 5–15)
BUN: <5 mg/dL — ABNORMAL LOW (ref 6–20)
CO2: 25 mmol/L (ref 22–32)
Calcium: 8.2 mg/dL — ABNORMAL LOW (ref 8.9–10.3)
Chloride: 104 mmol/L (ref 98–111)
Creatinine, Ser: 0.74 mg/dL (ref 0.44–1.00)
GFR calc Af Amer: >60 mL/min (ref >60)
GFR calc non Af Amer: >60 mL/min (ref >60)
Glucose, Bld: 268 mg/dL — ABNORMAL HIGH (ref 70–99)
Potassium: 4.5 mmol/L (ref 3.5–5.1)
Sodium: 135 mmol/L (ref 135–145)
Total Bilirubin: 0.4 mg/dL (ref 0.3–1.2)
Total Protein: 5.9 g/dL — ABNORMAL LOW (ref 6.5–8.1)

## 2018-06-17 LAB — GLUCOSE, CAPILLARY: Glucose-Capillary: 250 mg/dL — ABNORMAL HIGH (ref 70–99)

## 2018-06-17 LAB — LACTIC ACID, PLASMA: Lactic Acid, Venous: 2.5 mmol/L (ref 0.5–1.9)

## 2018-06-17 MED ORDER — DICLOFENAC SODIUM 1 % TD GEL: 2.0000 g | Freq: Four times a day (QID) | TRANSDERMAL | 0 refills | Status: DC | PRN

## 2018-06-17 MED ORDER — ATORVASTATIN CALCIUM 20 MG PO TABS: ORAL_TABLET | ORAL | Status: DC

## 2018-06-17 MED ORDER — GLIPIZIDE 5 MG PO TABS: 5.0000 mg | ORAL_TABLET | Freq: Two times a day (BID) | ORAL | 0 refills | Status: DC

## 2018-06-17 NOTE — Telephone Encounter (Signed)
Called pt to verify appt that's been made for 06/20/18. Pt is aware of the appt she states she schedule appt yesterday. Inform pt had some additional questions concerning discharge completed TCM call below.Johny Chess  Transition Care Management Follow-up Telephone Call   Date discharged? 06/17/18   How have you been since you were released from the hospital? Pt states she is doing alright   Do you understand why you were in the hospital? YES, pt states she went to ED for abdominal pain, but hosp did not find anything wrong after testing   Do you understand the discharge instructions? YES   Where were you discharged to? Home   Items Reviewed:  Medications reviewed: YES, Not taking the metformin  Allergies reviewed: YES  Dietary changes reviewed: YES, carb modified and heart healthy  Referrals reviewed: No referral needed   Functional Questionnaire:   Activities of Daily Living (ADLs):   She states she are independent in the following: ambulation, bathing and hygiene, feeding, continence, grooming, toileting and dressing States she doesn't require assistance    Any transportation issues/concerns?: No   Any patient concerns? NO   Confirmed importance and date/time of follow-up visits scheduled YES, appt 06/20/18  Provider Appointment booked with Dr. Quay Burow   Confirmed with patient if condition begins to worsen call PCP or go to the ER.  Patient was given the office number and encouraged to call back with question or concerns.  : YES

## 2018-06-17 NOTE — Progress Notes (Signed)
Pt would not remain in room for me to go over discharge paper work. Pt followed me to nurses station off of printer. Pt stood at desk to go over AVS. Pt did not wait on wheelchair, walked out with daughter. No distress noted. Carroll Kinds RN

## 2018-06-17 NOTE — Progress Notes (Signed)
CRITICAL VALUE ALERT Lactic acid  Critical Value:  2.5  Date & Time Notified: 3/6  0400  Provider Notified: [place]heimer  Orders Received/Actions taken:will continue to monitor patient.

## 2018-06-17 NOTE — Progress Notes (Signed)
Pt refusing am labs. Dr. Ree Kida notified. Carroll Kinds RN

## 2018-06-19 DIAGNOSIS — K760 Fatty (change of) liver, not elsewhere classified: Secondary | ICD-10-CM | POA: Insufficient documentation

## 2018-06-19 NOTE — Patient Instructions (Addendum)
  Tests ordered today. Your results will be released to [facility] (or called to you) after review, usually within 72hours after test completion. If any changes need to be made, you will be notified at that same time.   Medications reviewed and updated.  Changes include :     Stop tizanidine.   Start ozempic weekly - take as prescribed.   Hold atorvastatin until our next visit.     Your prescription(s) have been submitted to your pharmacy. Please take as directed and contact our office if you believe you are having problem(s) with the medication(s).   Please followup in 2 months

## 2018-06-19 NOTE — Progress Notes (Signed)
Subjective:    Patient ID: Tammy Strickland, female    DOB: Mar 03, 1961, 58 y.o.   MRN: [id number]  HPI The patient is here for follow up from the hospital.  Admitted  06/14/18- 06/17/18 for lactic acidosis, transaminitis  She went to the ED for epigastric and RUQ abdominal pain, nausea, occasional vomiting, but no diarrhea.  She had a cough and URI symptoms.  She had no fever.  Symptoms started 5 days prior and were constant.  Abdominal pain was worse with coughing.     Lactic acidosis, abdominal pain: Likely related to dehydration versus metformin Lactic acid 6.1 on admission, decreased to 2.5 Influenza negative, UA and chest x-ray unremarkable for infection Blood cultures-negative to date Anion gap-normal Blood sugars 207-261 Right upper quadrant ultrasound-s/p cholecystectomy.  CBD not clearly seen, echogenic liver consistent with steatosis and/or hepatocellular disease CT abdomen/pelvis-no acute abdominal or pelvic abnormality Metformin held ?  Gastroparesis-had no vomiting Placed on ceftriaxone for intra-abdominal coverage MRCP: Severe diffuse hepatic steatosis, no overt morphological changes of cirrhosis, no liver masses, no biliary duct dilatation or evidence of Coledocholithiasis  Transaminitis: Likely secondary to fatty liver Improved Atorvastatin discontinued-restart when LFTs have normalized Patient did not want to discontinue Zanaflex-recommend reducing dose  Essential hypertension: Continue clonidine, losartan  Chronic diastolic heart failure: Lasix held-restarted on discharge Appears euvolemic BNP 28  COPD: Stable, continue home meds  Diabetes: Metformin held secondary lactic acidosis Started on glipizide   Abdominal pain:  She states that her RUQ pain started suddenly and then she started coughing, vomiting and the pain has persisted.   She has had intermittent pain in her RUQ in the past that sounded like a muscle spasm.  She states the pain is still  there and is a burning type pain.  She has applied topical diclofenac and it helps.  She is concerned about what caused her pain.  HFpEF, Hypertension: She is taking her medication daily. She is compliant with a low sodium diet.  She denies chest pain, palpitations, shortness of breath.. She is not exercising regularly.       Diabetes: She is taking her medication daily as prescribed. Her metformin was stopped and she was on glipizide.  She had been on trulicity in the past.  She is not compliant with a diabetic diet. She is not exercising regularly.   Lactis acidosis:  She is not taking the metformin.  She states a little fevers on and off.  She denies chills.  She denies cough, wheeze, SOB and urinary symptoms.  She denies diarrhea.   Obesity:  She knows she needs to lose weight.  She plans on starting at the gym next week.  She has stopped drinking coffee and soda.     Medications and allergies reviewed with patient and updated if appropriate.  Patient Active Problem List   Diagnosis Date Noted  . Fatty liver 06/19/2018  . Lactic acidosis 06/15/2018  . Transaminitis 06/15/2018  . Epigastric pain 06/15/2018  . Chronic bilateral low back pain without sciatica 05/17/2018  . Body mass index 50.0-59.9, adult ([place]) 05/17/2018  . Acute bronchitis with COPD ([place]) 05/12/2018  . Boil 05/12/2018  . It band syndrome, left 12/29/2017  . Trochanteric bursitis, left hip 12/29/2017  . Cough 08/05/2017  . Dysphagia 04/29/2017  . History of left knee replacement 04/21/2017  . Pain in left ankle and joints of left foot 04/21/2017  . Pain in left hand 04/21/2017  . OSA (obstructive sleep apnea) 02/23/2017  .  Acute gouty arthritis 12/25/2016  . Stroke ([place])   . Chronic fatigue 11/09/2016  . Hypoxia 11/09/2016  . Morbidly obese ([place]) 11/09/2016  . Rash and nonspecific skin eruption 07/21/2016  . Frequent headaches 07/21/2016  . Allergic rhinitis 07/16/2016  . Vitamin D deficiency 04/16/2016  .  Hyperlipidemia 01/15/2016  . Gait disorder 12/04/2015  . Diabetes ([place]) 11/26/2015  . Chronic diastolic congestive heart failure ([place]) 11/06/2015  . TIA (transient ischemic attack)   . Carotid-cavernous fistula   . Chest pain 10/31/2015  . Insomnia 10/10/2015  . Depression 10/10/2015  . Benign essential HTN 08/15/2015  . Fibromyalgia 08/15/2015  . Peripheral neuropathy 08/15/2015  . CAD (coronary artery disease) 08/15/2015  . COPD (chronic obstructive pulmonary disease) ([place]) 08/15/2015  . Osteoporosis 08/15/2015  . 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 DDD (degenerative disc disease), lumbar 08/15/2015  . Occupational exposure to industrial toxins 08/15/2015  . GERD (gastroesophageal reflux disease) 08/15/2015    Current Outpatient Medications on File Prior to Visit  Medication Sig Dispense Refill  . amitriptyline (ELAVIL) 50 MG tablet Take 1 tablet by mouth at bedtime 30 tablet 3  . aspirin EC 81 MG tablet Take 1 tablet (81 mg total) by mouth daily.    Marland Kitchen atorvastatin (LIPITOR) 20 MG tablet Hold until instructed to restart by your primary doctor    . cloNIDine (CATAPRES) 0.3 MG tablet Take 1 tablet (0.3 mg total) by mouth daily. 90 tablet 3  . diclofenac sodium (VOLTAREN) 1 % GEL Apply 2 g topically 4 (four) times daily as needed. 100 g 0  . fluticasone (FLONASE) 50 MCG/ACT nasal spray Shake liquid & use 1 spray into each nostril every day as needed for allergies 16 g 2  . furosemide (LASIX) 80 MG tablet Take 1 tablet (80 mg total) by mouth daily. 90 tablet 3  . gabapentin (NEURONTIN) 600 MG tablet Take 2 tablets by mouth 3 times a day (Patient taking differently: Take 1,200 mg by mouth 2 (two) times daily. ) 180 tablet 3  . glipiZIDE (GLUCOTROL) 5 MG tablet Take 1 tablet (5 mg total) by mouth 2 (two) times daily for 30 days. 60 tablet 0  . glucose blood test strip Use as instructed to check sugar three times a day 200 each 5  . ipratropium-albuterol (DUONEB) 0.5-2.5 (3) MG/3ML SOLN Take 3  mLs by nebulization every 4 (four) hours as needed. (Patient taking differently: Take 3 mLs by nebulization every 4 (four) hours as needed (SOB). ) 120 mL 0  . Lancets MISC Use as directed twice per day 200 each 5  . losartan (COZAAR) 50 MG tablet Take 1 tablet (50 mg total) by mouth 2 (two) times daily. 180 tablet 3  . metoprolol (TOPROL-XL) 200 MG 24 hr tablet Take 1 tablet (200 mg total) by mouth 2 (two) times daily. 180 tablet 3  . mometasone (NASONEX) 50 MCG/ACT nasal spray Place 2 sprays into the nose daily. (Patient taking differently: Place 2 sprays into the nose daily as needed (allergies). ) 51 g 1  . nitroGLYCERIN (NITROSTAT) 0.4 MG SL tablet Place 0.4 mg under the tongue every 5 (five) minutes as needed for chest pain.    Marland Kitchen omeprazole (PRILOSEC) 40 MG capsule Take 1 capsule (40 mg total) by mouth daily. 90 capsule 3  . oxyCODONE-acetaminophen (PERCOCET) 10-325 MG tablet Take 1 tablet by mouth every 6 (six) hours as needed for pain.   0  . potassium chloride SA (K-DUR,KLOR-CON) 20 MEQ tablet Take 1 tablet (  20 mEq total) by mouth daily. 90 tablet 3  . SYMBICORT 80-4.5 MCG/ACT inhaler Inhale 2 Puffs by mouth twice daily (Patient taking differently: Inhale 2 puffs into the lungs daily as needed (SOB). ) 1 Inhaler 3  . tiZANidine (ZANAFLEX) 4 MG tablet TAKE FOUR TABLETS BY MOUTH TWICE DAILY (Patient taking differently: Take 10 mg by mouth 2 (two) times daily. ) 240 tablet 1   No current facility-administered medications on file prior to visit.     Past Medical History:  Diagnosis Date  . Anxiety   . Arthritis   . CHF (congestive heart failure) ([place])   . COPD (chronic obstructive pulmonary disease) ([place])   . Coronary artery disease   . Depression   . Diabetes mellitus without complication ([place])    type 2   . GERD (gastroesophageal reflux disease)   . Hypertension   . Lupus ([place])   . Migraines   . Neuromuscular disorder ([place])   . Osteoporosis   . Oxygen deficiency    pt uses 2.5L  02 at night   . Peripheral neuropathy   . Shingles   . Sleep apnea    had sleep study done recently ; unaware if she will be getting  a CPAP device ; patient states "im pretty sure i have it , i fall alseep all the time "  . Stroke [facility]) 10/2015    Past Surgical History:  Procedure Laterality Date  . ABDOMINAL HYSTERECTOMY     ovaries left  . APPENDECTOMY    . CHOLECYSTECTOMY    . IR GENERIC HISTORICAL  11/07/2015   IR ANGIO INTRA EXTRACRAN SEL COM CAROTID INNOMINATE BILAT MOD SED 11/07/2015 Luanne Bras, MD [facility]  . IR GENERIC HISTORICAL  11/07/2015   IR ANGIO VERTEBRAL SEL SUBCLAVIAN INNOMINATE UNI R MOD SED 11/07/2015 Luanne Bras, MD [facility]  . IR GENERIC HISTORICAL  11/07/2015   IR ANGIO VERTEBRAL SEL VERTEBRAL UNI L MOD SED 11/07/2015 Luanne Bras, MD [facility]  . IR GENERIC HISTORICAL  11/07/2015   IR ANGIOGRAM EXTREMITY LEFT 11/07/2015 Luanne Bras, MD [facility]  . TONSILLECTOMY    . TOTAL KNEE ARTHROPLASTY Left 03/12/2017   Procedure: LEFT TOTAL KNEE ARTHROPLASTY;  Surgeon: Mcarthur Rossetti, MD;  Location: [facility];  Service: Orthopedics;  Laterality: Left;  Adductor Block    Social History   Socioeconomic History  . Marital status: Single    Spouse name: n/a  . Number of children: 3  . Years of education: 12+  . Highest education level: Not on file  Occupational History  . 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 Financial resource strain: Not on file  . Food insecurity:    Worry: Not on file    Inability: Not on file  . Transportation needs:    Medical: Not on file    Non-medical: Not on file  Tobacco Use  . Smoking status: Current Every Day Smoker    Packs/day: 0.25    Years: 35.00    Pack years: 8.75    Types: Cigarettes  . Smokeless tobacco: Never Used  . Tobacco comment: referred  to smoking  cessation  classes. at   [facility]   Substance and Sexual Activity  . Alcohol use: No  . Drug use: No  . Sexual activity: Not Currently    Partners: Female    Birth control/protection: Surgical    Comment: hysterectomy  Lifestyle  .  Physical activity:    Days per week: Not on file    Minutes per session: Not on file  . Stress: Not on file  Relationships  . Social connections:    Talks on phone: Not on file    Gets together: Not on file    Attends religious service: Not on file    Active member of club or organization: Not on file    Attends meetings of clubs or organizations: Not on file    Relationship status: Not on file  Other Topics Concern  . Not on file  Social History Narrative   Moved to [place] from [place], [place] February 2017, to help her daughter.   Lives with her daughter.   Sons live in [place] and [place].   She reports that there were originally 17 children in her family (she is the youngest), the oldest are deceased, some prior to her birth, and she isn't sure which were female/female or how they died.    Family History  Problem Relation Age of Onset  . Hyperlipidemia Mother   . Hypertension Mother   . Stroke Mother   . Thyroid disease Mother   . Heart attack Mother   . Hyperlipidemia Father   . Hypertension Father   . Stroke Father   . Heart attack Father   . Hypertension Sister   . Cancer Sister        breast cancer  . Stroke Sister   . Thyroid disease Sister   . Crohn's disease Sister   . Hypertension Sister   . Hypertension Brother   . Diabetes Brother   . Hypertension Brother     Review of Systems  Constitutional: Positive for fever (a little off and on). Negative for chills.  Respiratory: Negative for cough, shortness of breath and wheezing.   Cardiovascular: Positive for leg swelling. Negative for chest pain and palpitations.  Gastrointestinal: Positive for abdominal pain (RUQ - burning sensation). Negative for blood in stool, constipation, diarrhea and nausea.        GERD controlled  Genitourinary: Negative for dysuria and hematuria.  Musculoskeletal: Negative for myalgias.  Neurological: Positive for light-headedness and headaches.       Objective:   Vitals:   06/22/18 0930  BP: 110/78  Pulse: 67  Resp: 16  Temp: 97.9 F (36.6 C)  SpO2: 95%   BP Readings from Last 3 Encounters:  06/22/18 110/78  06/17/18 (!) 147/96  06/14/18 (!) 133/95   Wt Readings from Last 3 Encounters:  06/22/18 (!) 321 lb (145.6 kg)  06/17/18 (!) 320 lb (145.2 kg)  05/17/18 (!) 310 lb (140.6 kg)   Body mass index is 52.6 kg/m.   Physical Exam    Constitutional: Appears very drowsy and needs to be waken up several times during the visit. No distress.  HENT:  Head: Normocephalic and atraumatic.  Neck: Neck supple. No tracheal deviation present. No thyromegaly present.  No cervical lymphadenopathy Cardiovascular: Normal rate, regular rhythm and normal heart sounds.   No murmur heard. No carotid bruit .  Mild 1+ pitting b/l LE edema Pulmonary/Chest: Effort normal and breath sounds normal. No respiratory distress. No has no wheezes. No rales. Abdomen: soft, obese, tenderness with light palpation in RUQ, no tenderness elsewhere  Skin: Skin is warm and dry. Not diaphoretic.  Psychiatric: Normal mood and affect. Behavior is normal.   MR 3D Recon At Scanner CLINICAL DATA:  Inpatient. COPD. CHF. Admitted with several days of epigastric and right upper quadrant abdominal  pain, nausea and vomiting. Prior cholecystectomy.  EXAM: MRI ABDOMEN WITHOUT AND WITH CONTRAST (INCLUDING MRCP)  TECHNIQUE: Multiplanar multisequence MR imaging of the abdomen was performed both before and after the administration of intravenous contrast. Heavily T2-weighted images of the biliary and pancreatic ducts were obtained, and three-dimensional MRCP images were rendered by post processing.  CONTRAST:  10 cc Gadavist IV.  COMPARISON:  06/15/2018 right upper quadrant abdominal  sonogram and CT abdomen/pelvis.  FINDINGS: Significantly motion degraded scan, limiting assessment.  Lower chest: No acute abnormality at the lung bases.  Hepatobiliary: Mild hepatomegaly. No definite liver surface irregularity. Severe diffuse hepatic steatosis. No liver mass. Cholecystectomy. No biliary ductal dilatation. Common bile duct diameter 5 mm. No biliary filling defects, masses or strictures.  Pancreas: No pancreatic mass or duct dilation.  No pancreas divisum.  Spleen: Normal size. No mass.  Adrenals/Urinary Tract: Normal adrenals. No hydronephrosis. Normal kidneys with no renal mass.  Stomach/Bowel: Normal non-distended stomach. Visualized small and large bowel is normal caliber, with no bowel wall thickening.  Vascular/Lymphatic: Atherosclerotic nonaneurysmal abdominal aorta. Patent portal, splenic, hepatic and renal veins. No pathologically enlarged lymph nodes in the abdomen.  Other: No abdominal ascites or focal fluid collection.  Musculoskeletal: No aggressive appearing focal osseous lesions.  IMPRESSION: 1. Severe diffuse hepatic steatosis. Mild hepatomegaly. No overt morphologic changes of cirrhosis. No liver masses. 2. Cholecystectomy. No biliary ductal dilatation. No evidence of choledocholithiasis. 3.  Aortic Atherosclerosis (ICD10-I70.0).  Electronically Signed   By: Ilona Sorrel M.D.   On: 06/16/2018 14:25 MR ABDOMEN MRCP W WO CONTAST CLINICAL DATA:  Inpatient. COPD. CHF. Admitted with several days of epigastric and right upper quadrant abdominal pain, nausea and vomiting. Prior cholecystectomy.  EXAM: MRI ABDOMEN WITHOUT AND WITH CONTRAST (INCLUDING MRCP)  TECHNIQUE: Multiplanar multisequence MR imaging of the abdomen was performed both before and after the administration of intravenous contrast. Heavily T2-weighted images of the biliary and pancreatic ducts were obtained, and three-dimensional MRCP images were rendered by  post processing.  CONTRAST:  10 cc Gadavist IV.  COMPARISON:  06/15/2018 right upper quadrant abdominal sonogram and CT abdomen/pelvis.  FINDINGS: Significantly motion degraded scan, limiting assessment.  Lower chest: No acute abnormality at the lung bases.  Hepatobiliary: Mild hepatomegaly. No definite liver surface irregularity. Severe diffuse hepatic steatosis. No liver mass. Cholecystectomy. No biliary ductal dilatation. Common bile duct diameter 5 mm. No biliary filling defects, masses or strictures.  Pancreas: No pancreatic mass or duct dilation.  No pancreas divisum.  Spleen: Normal size. No mass.  Adrenals/Urinary Tract: Normal adrenals. No hydronephrosis. Normal kidneys with no renal mass.  Stomach/Bowel: Normal non-distended stomach. Visualized small and large bowel is normal caliber, with no bowel wall thickening.  Vascular/Lymphatic: Atherosclerotic nonaneurysmal abdominal aorta. Patent portal, splenic, hepatic and renal veins. No pathologically enlarged lymph nodes in the abdomen.  Other: No abdominal ascites or focal fluid collection.  Musculoskeletal: No aggressive appearing focal osseous lesions.  IMPRESSION: 1. Severe diffuse hepatic steatosis. Mild hepatomegaly. No overt morphologic changes of cirrhosis. No liver masses. 2. Cholecystectomy. No biliary ductal dilatation. No evidence of choledocholithiasis. 3.  Aortic Atherosclerosis (ICD10-I70.0).  Electronically Signed   By: Ilona Sorrel M.D.   On: 06/16/2018 14:25    Lab Results  Component Value Date   WBC 7.7 06/15/2018   HGB 12.1 06/15/2018   HCT 38.8 06/15/2018   PLT 269 06/15/2018   GLUCOSE 318 (H) 06/22/2018   CHOL 113 05/12/2018   TRIG 124.0 05/12/2018   HDL 36.10 (L) 05/12/2018  LDLCALC 52 05/12/2018   ALT 38 (H) 06/22/2018   AST 80 (H) 06/22/2018   NA 137 06/22/2018   K 4.6 06/22/2018   CL 98 06/22/2018   CREATININE 0.76 06/22/2018   BUN 8 06/22/2018   CO2 31 06/22/2018    INR 1.06 11/07/2015   HGBA1C 11.0 (H) 06/22/2018   MICROALBUR <0.7 01/15/2016     Assessment & Plan:    See Problem List for Assessment and Plan of chronic medical problems.

## 2018-06-20 ENCOUNTER — Inpatient Hospital Stay: Payer: Self-pay | Admitting: Internal Medicine

## 2018-06-20 DIAGNOSIS — G894 Chronic pain syndrome: Secondary | ICD-10-CM | POA: Diagnosis not present

## 2018-06-20 DIAGNOSIS — M545 Low back pain: Secondary | ICD-10-CM | POA: Diagnosis not present

## 2018-06-20 DIAGNOSIS — M25562 Pain in left knee: Secondary | ICD-10-CM | POA: Diagnosis not present

## 2018-06-20 DIAGNOSIS — Z79891 Long term (current) use of opiate analgesic: Secondary | ICD-10-CM | POA: Diagnosis not present

## 2018-06-20 DIAGNOSIS — M25559 Pain in unspecified hip: Secondary | ICD-10-CM | POA: Diagnosis not present

## 2018-06-20 LAB — CULTURE, BLOOD (ROUTINE X 2)
Culture: NO GROWTH
Culture: NO GROWTH
Special Requests: ADEQUATE

## 2018-06-22 ENCOUNTER — Other Ambulatory Visit: Payer: Self-pay

## 2018-06-22 ENCOUNTER — Ambulatory Visit (INDEPENDENT_AMBULATORY_CARE_PROVIDER_SITE_OTHER): Payer: Medicare Other | Admitting: Internal Medicine

## 2018-06-22 ENCOUNTER — Telehealth: Payer: Self-pay

## 2018-06-22 ENCOUNTER — Other Ambulatory Visit (INDEPENDENT_AMBULATORY_CARE_PROVIDER_SITE_OTHER): Payer: Medicare Other

## 2018-06-22 ENCOUNTER — Encounter: Payer: Self-pay | Admitting: Internal Medicine

## 2018-06-22 VITALS — BP 110/78 | HR 67 | Temp 97.9°F | Resp 16 | Ht 65.5 in | Wt 321.0 lb

## 2018-06-22 DIAGNOSIS — K760 Fatty (change of) liver, not elsewhere classified: Secondary | ICD-10-CM | POA: Diagnosis not present

## 2018-06-22 DIAGNOSIS — R7401 Elevation of levels of liver transaminase levels: Secondary | ICD-10-CM

## 2018-06-22 DIAGNOSIS — R1011 Right upper quadrant pain: Secondary | ICD-10-CM | POA: Insufficient documentation

## 2018-06-22 DIAGNOSIS — I10 Essential (primary) hypertension: Secondary | ICD-10-CM

## 2018-06-22 DIAGNOSIS — E872 Acidosis, unspecified: Secondary | ICD-10-CM

## 2018-06-22 DIAGNOSIS — E119 Type 2 diabetes mellitus without complications: Secondary | ICD-10-CM

## 2018-06-22 DIAGNOSIS — E7849 Other hyperlipidemia: Secondary | ICD-10-CM

## 2018-06-22 DIAGNOSIS — M5136 Other intervertebral disc degeneration, lumbar region: Secondary | ICD-10-CM

## 2018-06-22 DIAGNOSIS — R740 Nonspecific elevation of levels of transaminase and lactic acid dehydrogenase [LDH]: Secondary | ICD-10-CM

## 2018-06-22 DIAGNOSIS — I5032 Chronic diastolic (congestive) heart failure: Secondary | ICD-10-CM

## 2018-06-22 LAB — COMPREHENSIVE METABOLIC PANEL
ALT: 38 U/L — ABNORMAL HIGH (ref 0–35)
AST: 80 U/L — ABNORMAL HIGH (ref 0–37)
Albumin: 3.9 g/dL (ref 3.5–5.2)
Alkaline Phosphatase: 141 U/L — ABNORMAL HIGH (ref 39–117)
BUN: 8 mg/dL (ref 6–23)
CO2: 31 mEq/L (ref 19–32)
Calcium: 9 mg/dL (ref 8.4–10.5)
Chloride: 98 mEq/L (ref 96–112)
Creatinine, Ser: 0.76 mg/dL (ref 0.40–1.20)
GFR: 95.18 mL/min (ref >60.00)
Glucose, Bld: 318 mg/dL — ABNORMAL HIGH (ref 70–99)
Potassium: 4.6 mEq/L (ref 3.5–5.1)
Sodium: 137 mEq/L (ref 135–145)
Total Bilirubin: 0.4 mg/dL (ref 0.2–1.2)
Total Protein: 7 g/dL (ref 6.0–8.3)

## 2018-06-22 LAB — HEMOGLOBIN A1C: Hgb A1c MFr Bld: 11 % — ABNORMAL HIGH (ref 4.6–6.5)

## 2018-06-22 MED ORDER — SEMAGLUTIDE(0.25 OR 0.5MG/DOS) 2 MG/1.5ML ~~LOC~~ SOPN: PEN_INJECTOR | SUBCUTANEOUS | 5 refills | Status: DC

## 2018-06-22 NOTE — Assessment & Plan Note (Addendum)
Deferred nutrition Will start at the gym next week Decrease portions Stressed low carbs/sugars -- eat lots of veges, fruits and lean proteins ozempic started for sugars F/u in 2 months

## 2018-06-22 NOTE — Telephone Encounter (Signed)
Per pts request called her pharmacy ([facility]) to let them know medication updates that were done today in the office. The person I spoke with through [facility] advised that her next box of medications have been mailed out to her. I LVM with pt to advise to take the medications out that she is no longer supposed to be taking. Told her to call back with any questions or concerns.

## 2018-06-22 NOTE — Assessment & Plan Note (Signed)
Very drowsy here today and has to be waken up multiple times during our visit - she states she did not sleep last night, but she admits tizanidine causes her drowsiness Will d/c tizanidine

## 2018-06-22 NOTE — Assessment & Plan Note (Signed)
euvolemic on exam Continue lasix at current dose Increase exercise, work on weight loss Low sodium diet

## 2018-06-22 NOTE — Assessment & Plan Note (Signed)
Still having RUQ pain - no cause found on Abd [place], Ct of A/P and MRCP Possible diaphragm spasm She is tender superficially Topical diclofenac is helping - continue

## 2018-06-22 NOTE — Assessment & Plan Note (Signed)
Likely related to fatty liver Working on weight loss cmp today F/u in 2 months

## 2018-06-22 NOTE — Assessment & Plan Note (Signed)
BP well controlled Current regimen effective and well tolerated Continue current medications at current doses  

## 2018-06-22 NOTE — Assessment & Plan Note (Signed)
Discussed importance of weight loss Will start exercising, decrease portions, low carb/sugar diet - increase veges/fruits and lean protein, no soda/coffee cmp

## 2018-06-22 NOTE — Assessment & Plan Note (Signed)
Will continue to hold statin for now Re-evaluate in 2 months

## 2018-06-22 NOTE — Assessment & Plan Note (Signed)
a1c was well controlled except over the past few months Recheck a1c today Will continue glipizide Start ozempic Will start exercising next week Decrease carbs/sugars Deferred nutrition Working on weight loss

## 2018-06-22 NOTE — Assessment & Plan Note (Signed)
Lactic acidosis - likely from metformin and dehydration - no other cause found Metformin stopped - added to allergy list Recheck lactic acid today

## 2018-06-24 ENCOUNTER — Other Ambulatory Visit: Payer: Self-pay | Admitting: Internal Medicine

## 2018-06-24 DIAGNOSIS — R062 Wheezing: Secondary | ICD-10-CM | POA: Diagnosis not present

## 2018-06-24 DIAGNOSIS — I10 Essential (primary) hypertension: Secondary | ICD-10-CM

## 2018-06-24 DIAGNOSIS — J4520 Mild intermittent asthma, uncomplicated: Secondary | ICD-10-CM | POA: Diagnosis not present

## 2018-06-24 LAB — LACTIC ACID, PLASMA: LACTIC ACID: 2.3 mmol/L — ABNORMAL HIGH (ref 0.4–1.8)

## 2018-06-27 ENCOUNTER — Other Ambulatory Visit: Payer: Self-pay | Admitting: Internal Medicine

## 2018-06-27 NOTE — Telephone Encounter (Signed)
Copied from [place] [id number]. Topic: Quick Communication - Rx Refill/Question >> Jun 27, 2018 10:39 AM Sheran Luz wrote: Medication: Semaglutide,0.25 or 0.5MG /DOS, (OZEMPIC, 0.25 OR 0.5 MG/DOSE,) 2 MG/1.5ML SOPN  Patient states this medication was supposed to be sent to [facility], [address] AT [place]  [phone] (Phone) [phone] (Fax)

## 2018-06-29 ENCOUNTER — Ambulatory Visit (INDEPENDENT_AMBULATORY_CARE_PROVIDER_SITE_OTHER): Payer: Medicare Other | Admitting: Orthopaedic Surgery

## 2018-07-18 ENCOUNTER — Other Ambulatory Visit: Payer: Self-pay | Admitting: Internal Medicine

## 2018-07-18 DIAGNOSIS — G894 Chronic pain syndrome: Secondary | ICD-10-CM | POA: Diagnosis not present

## 2018-07-18 DIAGNOSIS — M25562 Pain in left knee: Secondary | ICD-10-CM | POA: Diagnosis not present

## 2018-07-18 DIAGNOSIS — M545 Low back pain: Secondary | ICD-10-CM | POA: Diagnosis not present

## 2018-07-18 DIAGNOSIS — Z79891 Long term (current) use of opiate analgesic: Secondary | ICD-10-CM | POA: Diagnosis not present

## 2018-07-18 DIAGNOSIS — M25559 Pain in unspecified hip: Secondary | ICD-10-CM | POA: Diagnosis not present

## 2018-07-19 MED ORDER — GLIPIZIDE 5 MG PO TABS: 5.0000 mg | ORAL_TABLET | Freq: Two times a day (BID) | ORAL | 1 refills | Status: DC

## 2018-07-19 NOTE — Telephone Encounter (Signed)
Please advise, last filled by a different provider

## 2018-07-20 ENCOUNTER — Telehealth: Payer: Self-pay | Admitting: Emergency Medicine

## 2018-07-20 MED ORDER — FLUCONAZOLE 150 MG PO TABS: 150.0000 mg | ORAL_TABLET | Freq: Once | ORAL | 0 refills | Status: AC

## 2018-07-20 NOTE — Telephone Encounter (Signed)
sent 

## 2018-07-20 NOTE — Telephone Encounter (Signed)
Spoke with pt today, she asked that an RX be sent in for a yeast infection. C/o of itching. Has not been on recent antibiotic. Please send 2 tablets to [facility] on [address].

## 2018-07-21 ENCOUNTER — Other Ambulatory Visit: Payer: Self-pay | Admitting: *Deleted

## 2018-07-21 ENCOUNTER — Other Ambulatory Visit: Payer: Self-pay | Admitting: Internal Medicine

## 2018-07-21 MED ORDER — GLIPIZIDE 5 MG PO TABS: 5.0000 mg | ORAL_TABLET | Freq: Two times a day (BID) | ORAL | 1 refills | Status: DC

## 2018-07-21 NOTE — Telephone Encounter (Signed)
Relation to pt: self  Call back number: [phone] Pharmacy: [facility] [address], [place] [phone] (Phone) [phone] (Fax)     Reason for call:  Patient was informed by her pharmacy glipiZIDE (GLUCOTROL) 5 MG tablet PA is required, please advise

## 2018-07-24 ENCOUNTER — Other Ambulatory Visit: Payer: Self-pay | Admitting: Internal Medicine

## 2018-07-25 DIAGNOSIS — J4520 Mild intermittent asthma, uncomplicated: Secondary | ICD-10-CM | POA: Diagnosis not present

## 2018-07-25 DIAGNOSIS — R062 Wheezing: Secondary | ICD-10-CM | POA: Diagnosis not present

## 2018-07-26 ENCOUNTER — Ambulatory Visit: Payer: Self-pay | Admitting: Internal Medicine

## 2018-08-04 ENCOUNTER — Other Ambulatory Visit: Payer: Self-pay

## 2018-08-04 NOTE — Patient Outreach (Signed)
[facility]) Care Management  08/04/2018  Tammy Strickland [birth year] [id number]   Medication Adherence call to Tammy Strickland [place] Identifiers Verify spoke with patient she is due on Atorvastatin 20 mg she explain she is no longer taking this medication doctor took her off. Tammy Strickland is showing past due under [place].   [place] Management Direct Dial [phone]  Fax [phone] [email or web address]

## 2018-08-15 DIAGNOSIS — M545 Low back pain: Secondary | ICD-10-CM | POA: Diagnosis not present

## 2018-08-15 DIAGNOSIS — M25559 Pain in unspecified hip: Secondary | ICD-10-CM | POA: Diagnosis not present

## 2018-08-15 DIAGNOSIS — M25562 Pain in left knee: Secondary | ICD-10-CM | POA: Diagnosis not present

## 2018-08-15 DIAGNOSIS — Z79891 Long term (current) use of opiate analgesic: Secondary | ICD-10-CM | POA: Diagnosis not present

## 2018-08-15 DIAGNOSIS — G894 Chronic pain syndrome: Secondary | ICD-10-CM | POA: Diagnosis not present

## 2018-08-16 ENCOUNTER — Other Ambulatory Visit: Payer: Self-pay | Admitting: Internal Medicine

## 2018-08-21 NOTE — Progress Notes (Signed)
Virtual Visit via Video Note  I connected with Tammy Strickland on 08/22/18 at  7:45 AM EDT by a video enabled telemedicine application and verified that I am speaking with the correct person using two identifiers.   I discussed the limitations of evaluation and management by telemedicine and the availability of in person appointments. The patient expressed understanding and agreed to proceed.  The patient is currently at home and I am in the office.    No referring provider.    History of Present Illness: She is here for follow up of her chronic medical conditions.   Elevated LFTs:  We held her statin for the past two months and she states she is not taking it.   Diabetes: She is taking her medication daily as prescribed, but only 0.25 mg of ozempic . She is compliant with a diabetic diet.  She monitors her sugars and they have been running 286, 320, 117 - in 200's for the most part. She is exercising minimally.    RUQ abdominal pain:  She denies abdominal pain.  She has not had any nausea, diarrhea or constipation.  Lactic acidosis:  Likely was related to metformin and dehydration.  We stopped the metformin and she is trying to drink more water.       Review of Systems  Constitutional: Negative for chills and fever.  Respiratory: Positive for cough. Negative for shortness of breath and wheezing.   Cardiovascular: Positive for leg swelling. Negative for chest pain and palpitations.  Gastrointestinal: Negative for abdominal pain, constipation, diarrhea and nausea.  Neurological: Positive for headaches. Negative for dizziness.      Social History   Socioeconomic History  . Marital status: Single    Spouse name: n/a  . Number of children: 3  . Years of education: 12+  . Highest education level: Not on file  Occupational History  . 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 Financial  resource strain: Not on file  . Food insecurity:    Worry: Not on file    Inability: Not on file  . Transportation needs:    Medical: Not on file    Non-medical: Not on file  Tobacco Use  . Smoking status: Current Every Day Smoker    Packs/day: 0.25    Years: 35.00    Pack years: 8.75    Types: Cigarettes  . Smokeless tobacco: Never Used  . Tobacco comment: referred  to smoking  cessation  classes. at  [facility]   Substance and Sexual Activity  . Alcohol use: No  . Drug use: No  . Sexual activity: Not Currently    Partners: Female    Birth control/protection: Surgical    Comment: hysterectomy  Lifestyle  . Physical activity:    Days per week: Not on file    Minutes per session: Not on file  . Stress: Not on file  Relationships  . Social connections:    Talks on phone: Not on file    Gets together: Not on file    Attends religious service: Not on file    Active member of club or organization: Not on file    Attends meetings of clubs or organizations: Not on file    Relationship status: Not on file  Other Topics Concern  . Not on file  Social History Narrative   Moved to [place] from [place], [place] February 2017, to help her daughter.  Lives with her daughter.   Sons live in [place] and [place].   She reports that there were originally 17 children in her family (she is the youngest), the oldest are deceased, some prior to her birth, and she isn't sure which were female/female or how they died.     Observations/Objective: Appears well in NAD Sleeping, falling asleep  Assessment and Plan:  See Problem List for Assessment and Plan of chronic medical problems.   Follow Up Instructions:    I discussed the assessment and treatment plan with the patient. The patient was provided an opportunity to ask questions and all were answered. The patient agreed with the plan and demonstrated an understanding of the instructions.   The patient was advised to call back or seek an  in-person evaluation if the symptoms worsen or if the condition fails to improve as anticipated.  Check labs - a1c, lactic acid, cmp now, follow up in 1 month    Binnie Rail, MD

## 2018-08-22 ENCOUNTER — Encounter: Payer: Self-pay | Admitting: Internal Medicine

## 2018-08-22 ENCOUNTER — Ambulatory Visit (INDEPENDENT_AMBULATORY_CARE_PROVIDER_SITE_OTHER): Payer: Medicare Other | Admitting: Internal Medicine

## 2018-08-22 DIAGNOSIS — E872 Acidosis, unspecified: Secondary | ICD-10-CM

## 2018-08-22 DIAGNOSIS — R1011 Right upper quadrant pain: Secondary | ICD-10-CM | POA: Diagnosis not present

## 2018-08-22 DIAGNOSIS — R7401 Elevation of levels of liver transaminase levels: Secondary | ICD-10-CM

## 2018-08-22 DIAGNOSIS — R740 Nonspecific elevation of levels of transaminase and lactic acid dehydrogenase [LDH]: Secondary | ICD-10-CM

## 2018-08-22 DIAGNOSIS — E119 Type 2 diabetes mellitus without complications: Secondary | ICD-10-CM

## 2018-08-22 MED ORDER — SITAGLIPTIN PHOSPHATE 100 MG PO TABS: 100.0000 mg | ORAL_TABLET | Freq: Every day | ORAL | 5 refills | Status: DC

## 2018-08-22 MED ORDER — SEMAGLUTIDE(0.25 OR 0.5MG/DOS) 2 MG/1.5ML ~~LOC~~ SOPN: 0.5000 mg | PEN_INJECTOR | SUBCUTANEOUS | 5 refills | Status: AC

## 2018-08-22 NOTE — Assessment & Plan Note (Signed)
Sugars still not controlled - only taking 0.25 mg of ozempic and should be taking 0.5 mg -- increase now to 0.5 mg F/u in one month - will increase to 1 mg Continue glipizide Start januvia 100 mg daily Encouraged increased activity and weight loss F/u in one month

## 2018-08-22 NOTE — Assessment & Plan Note (Signed)
Resolved W/u during recent hospitalization was neg - [place], CT a /p and mrcp ? diaphragm spasm

## 2018-08-22 NOTE — Assessment & Plan Note (Signed)
Thought to be related to metformin which has been discontinued and dehydration No longer on metformin Recheck lactic acid

## 2018-08-22 NOTE — Assessment & Plan Note (Signed)
Likely from fatty liver - stressed weight loss Atorvastatin held x past 2 months Recheck cmp - if still elevated - will restart statin and monitor

## 2018-08-24 DIAGNOSIS — R062 Wheezing: Secondary | ICD-10-CM | POA: Diagnosis not present

## 2018-08-24 DIAGNOSIS — J4520 Mild intermittent asthma, uncomplicated: Secondary | ICD-10-CM | POA: Diagnosis not present

## 2018-08-30 ENCOUNTER — Encounter: Payer: Self-pay | Admitting: Orthopaedic Surgery

## 2018-08-30 ENCOUNTER — Ambulatory Visit: Payer: Medicare Other

## 2018-08-30 ENCOUNTER — Other Ambulatory Visit: Payer: Self-pay

## 2018-08-30 ENCOUNTER — Ambulatory Visit (INDEPENDENT_AMBULATORY_CARE_PROVIDER_SITE_OTHER): Payer: Medicare Other | Admitting: Orthopaedic Surgery

## 2018-08-30 DIAGNOSIS — M25562 Pain in left knee: Secondary | ICD-10-CM

## 2018-08-30 DIAGNOSIS — G8929 Other chronic pain: Secondary | ICD-10-CM | POA: Diagnosis not present

## 2018-08-30 DIAGNOSIS — M545 Low back pain, unspecified: Secondary | ICD-10-CM

## 2018-08-30 DIAGNOSIS — Z96652 Presence of left artificial knee joint: Secondary | ICD-10-CM

## 2018-08-30 NOTE — Progress Notes (Signed)
Office Visit Note   Patient: Tammy Strickland           Date of Birth: February 02, 1961           MRN: [id number] Visit Date: 08/30/2018              Requested by: Binnie Rail, MD [place], [place] [address] PCP: Binnie Rail, MD   Assessment & Plan: Visit Diagnoses:  1. Chronic bilateral low back pain, unspecified whether sciatica present   2. Left knee pain, unspecified chronicity   3. History of left knee replacement     Plan: Certainly is discouraging for the fact that she has heard every day since her surgery 18 months ago.  Sometimes this is often the setting in someone who is on large doses of any narcotics chronically.  It is difficult to ever get full pain relief.  We will at least obtain a three-phase bone scan to rule out prosthetic loosening.  Will also obtain a lumbar spine MRI to rule out nerve compression.  Based on her clinical exam and what worsening on x-ray we would certainly want to have a strong indication for revision surgery because I would be uncertain as to whether or not that would truly help her from a pain standpoint given her chronic pain and given the fact that that knee is hurt her ever since the day we operated on her.  Since I am not seeing any fusion in the knee does not feel unstable on exam it makes it difficult to consider any other surgery or intervention without at least getting an idea of the stability of the implants with a bone scan.  She will schedule follow-up appointments with [place] when she has those studies.  Follow-Up Instructions: The patient will call for follow-up when she has her radiographic studies with the bone scan and the MRI.  Orders:  Orders Placed This Encounter  Procedures  . XR Knee 1-2 Views Left  . XR Lumbar Spine 2-3 Views   No orders of the defined types were placed in this encounter.     Procedures: No procedures performed   Clinical Data: No additional findings.   Subjective: Chief Complaint  Patient  presents with  . Left Knee - Edema  The patient is a 58 year old morbidly obese female who comes in with a significant amount of low back pain as well as left knee pain.  She says her left knee stays swollen.  We performed a left total knee arthroplasty on her in November 2018.  This was with press-fit components.  She is someone that is a patient in pain management.  She does list taking Percocet tens 4 times a day.  She does take this every day but sometimes not 4 times a day.  She is now on meloxicam due to inflammation.  She says her left knee has hurt her since day 1 of surgery and has not gotten better.  She gets swelling in both of her feet as well.  She reports pain in the lower aspect of the lumbar spine.  She has seen my partner Dr. Lorin Mercy for this before who recommended another imaging study at some point of her lumbar spine.  HPI  Review of Systems She currently denies any headache, chest pain, shortness of breath, fever, chills, nausea, vomiting  Objective: Vital Signs: There were no vitals taken for this visit.  Physical Exam She is alert and orient x3 and in no acute  distress.  She is ambulating using a cane.  She does state sometimes she uses her walker. Ortho Exam Examination of her left knee today shows good range of motion of left knee.  There is no effusion.  There is no swelling about her knee.  Her incisions well-healed.  Her knee is ligamentously intact and stable.  Anywhere I touch her above or below the knee or around the knee she says hurts significantly.  There is no redness about her knee.  Mobility of her lumbar spine does show some flexion extension but limited by her pain but also obesity.  There is otherwise no gross motor deficits in her lower extremities and no sensory deficits along any dermatomal pattern. Specialty Comments:  No specialty comments available.  Imaging: Xr Knee 1-2 Views Left  Result Date: 08/30/2018 2 views of the left knee show a total knee  arthroplasty with no malalignment.  There is no joint effusion at all.  There is slight sclerotic changes around the keel of the implant but no evidence of loosening at the tray-tibia terface.  Xr Lumbar Spine 2-3 Views  Result Date: 08/30/2018 2 views of the lumbar spine showed no acute findings.  There is slight arthritic changes in degenerative disc disease at several levels.  Visualization is limited by the patient's obesity.    PMFS History: Patient Active Problem List   Diagnosis Date Noted  . RUQ pain 06/22/2018  . Fatty liver 06/19/2018  . Lactic acid acidosis 06/15/2018  . Transaminitis 06/15/2018  . Chronic bilateral low back pain without sciatica 05/17/2018  . Body mass index 50.0-59.9, adult ([place]) 05/17/2018  . Acute bronchitis with COPD ([place]) 05/12/2018  . Boil 05/12/2018  . It band syndrome, left 12/29/2017  . Trochanteric bursitis, left hip 12/29/2017  . Cough 08/05/2017  . Dysphagia 04/29/2017  . History of left knee replacement 04/21/2017  . Pain in left ankle and joints of left foot 04/21/2017  . Pain in left hand 04/21/2017  . OSA (obstructive sleep apnea) 02/23/2017  . Acute gouty arthritis 12/25/2016  . Stroke ([place])   . Chronic fatigue 11/09/2016  . Hypoxia 11/09/2016  . Morbidly obese ([place]) 11/09/2016  . Rash and nonspecific skin eruption 07/21/2016  . Frequent headaches 07/21/2016  . Allergic rhinitis 07/16/2016  . Vitamin D deficiency 04/16/2016  . Hyperlipidemia 01/15/2016  . Gait disorder 12/04/2015  . Diabetes ([place]) 11/26/2015  . Chronic diastolic congestive heart failure ([place]) 11/06/2015  . TIA (transient ischemic attack)   . Carotid-cavernous fistula   . Chest pain 10/31/2015  . Insomnia 10/10/2015  . Depression 10/10/2015  . Benign essential HTN 08/15/2015  . Fibromyalgia 08/15/2015  . Peripheral neuropathy 08/15/2015  . CAD (coronary artery disease) 08/15/2015  . COPD (chronic obstructive pulmonary disease) ([place]) 08/15/2015  .  Osteoporosis 08/15/2015  . 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 DDD (degenerative disc disease), lumbar 08/15/2015  . Occupational exposure to industrial toxins 08/15/2015  . GERD (gastroesophageal reflux disease) 08/15/2015   Past Medical History:  Diagnosis Date  . Anxiety   . Arthritis   . CHF (congestive heart failure) ([place])   . COPD (chronic obstructive pulmonary disease) ([place])   . Coronary artery disease   . Depression   . Diabetes mellitus without complication ([place])    type 2   . GERD (gastroesophageal reflux disease)   . Hypertension   . Lupus ([place])   . Migraines   . Neuromuscular disorder ([place])   . Osteoporosis   . Oxygen deficiency  pt uses 2.5L 02 at night   . Peripheral neuropathy   . Shingles   . Sleep apnea    had sleep study done recently ; unaware if she will be getting  a CPAP device ; patient states "im pretty sure i have it , i fall alseep all the time "  . Stroke [facility]) 10/2015    Family History  Problem Relation Age of Onset  . Hyperlipidemia Mother   . Hypertension Mother   . Stroke Mother   . Thyroid disease Mother   . Heart attack Mother   . Hyperlipidemia Father   . Hypertension Father   . Stroke Father   . Heart attack Father   . Hypertension Sister   . Cancer Sister        breast cancer  . Stroke Sister   . Thyroid disease Sister   . Crohn's disease Sister   . Hypertension Sister   . Hypertension Brother   . Diabetes Brother   . Hypertension Brother     Past Surgical History:  Procedure Laterality Date  . ABDOMINAL HYSTERECTOMY     ovaries left  . APPENDECTOMY    . CHOLECYSTECTOMY    . IR GENERIC HISTORICAL  11/07/2015   IR ANGIO INTRA EXTRACRAN SEL COM CAROTID INNOMINATE BILAT MOD SED 11/07/2015 Luanne Bras, MD [facility]  . IR GENERIC HISTORICAL  11/07/2015   IR ANGIO VERTEBRAL SEL SUBCLAVIAN INNOMINATE UNI R MOD SED 11/07/2015 Luanne Bras, MD [facility]  . IR GENERIC HISTORICAL  11/07/2015   IR ANGIO VERTEBRAL SEL  VERTEBRAL UNI L MOD SED 11/07/2015 Luanne Bras, MD [facility]  . IR GENERIC HISTORICAL  11/07/2015   IR ANGIOGRAM EXTREMITY LEFT 11/07/2015 Luanne Bras, MD [facility]  . TONSILLECTOMY    . TOTAL KNEE ARTHROPLASTY Left 03/12/2017   Procedure: LEFT TOTAL KNEE ARTHROPLASTY;  Surgeon: Mcarthur Rossetti, MD;  Location: [facility];  Service: Orthopedics;  Laterality: Left;  Adductor Block   Social History   Occupational History  . 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 Smoking status: Current Every Day Smoker    Packs/day: 0.25    Years: 35.00    Pack years: 8.75    Types: Cigarettes  . Smokeless tobacco: Never Used  . Tobacco comment: referred  to smoking  cessation  classes. at  [facility]   Substance and Sexual Activity  . Alcohol use: No  . Drug use: No  . Sexual activity: Not Currently    Partners: Female    Birth control/protection: Surgical    Comment: hysterectomy

## 2018-09-12 DIAGNOSIS — M25562 Pain in left knee: Secondary | ICD-10-CM | POA: Diagnosis not present

## 2018-09-12 DIAGNOSIS — G894 Chronic pain syndrome: Secondary | ICD-10-CM | POA: Diagnosis not present

## 2018-09-12 DIAGNOSIS — M25559 Pain in unspecified hip: Secondary | ICD-10-CM | POA: Diagnosis not present

## 2018-09-12 DIAGNOSIS — Z79891 Long term (current) use of opiate analgesic: Secondary | ICD-10-CM | POA: Diagnosis not present

## 2018-09-12 DIAGNOSIS — M545 Low back pain: Secondary | ICD-10-CM | POA: Diagnosis not present

## 2018-09-20 ENCOUNTER — Other Ambulatory Visit: Payer: Self-pay

## 2018-09-20 NOTE — Patient Outreach (Signed)
[facility]) Care Management  09/20/2018  Tammy Strickland 06/10/1960 [id number]   Medication Adherence call to Tammy Strickland patients telephone number is disconnected patient is due on Atorvastatin 20 mg last month she explain she is no longer taking this medication.Tammy Strickland is showing past due under [place].   [facility] Direct Dial [phone]  Fax [phone] [email or web address]

## 2018-09-21 DIAGNOSIS — H524 Presbyopia: Secondary | ICD-10-CM | POA: Diagnosis not present

## 2018-09-24 DIAGNOSIS — J4520 Mild intermittent asthma, uncomplicated: Secondary | ICD-10-CM | POA: Diagnosis not present

## 2018-09-24 DIAGNOSIS — R062 Wheezing: Secondary | ICD-10-CM | POA: Diagnosis not present

## 2018-09-25 NOTE — Patient Instructions (Signed)
  Tests ordered today. Your results will be released to [place] (or called to you) after review, usually within 72hours after test completion. If any changes need to be made, you will be notified at that same time.  All other Health Maintenance issues reviewed.   All recommended immunizations and age-appropriate screenings are up-to-date or discussed.  No immunizations administered today.   Medications reviewed and updated.  Changes include :     Your prescription(s) have been submitted to your pharmacy. Please take as directed and contact our office if you believe you are having problem(s) with the medication(s).  A referral was ordered for   Please followup in

## 2018-09-25 NOTE — Progress Notes (Deleted)
Subjective:    Patient ID: Tammy Strickland, female    DOB: [birth year], 58 y.o.   MRN: [id number]  HPI The patient is here for follow up.   Diabetes: She is taking her medication daily as prescribed. She is compliant with a diabetic diet.  She monitors her sugars and they have been running XXX. She denies numbness/tingling in her feet and foot lesions. She is up-to-date with an ophthalmology examination.                                              Medications and allergies reviewed with patient and updated if appropriate.  Patient Active Problem List   Diagnosis Date Noted  . RUQ pain 06/22/2018  . Fatty liver 06/19/2018  . Lactic acid acidosis 06/15/2018  . Transaminitis 06/15/2018  . Chronic bilateral low back pain without sciatica 05/17/2018  . Body mass index 50.0-59.9, adult ([place]) 05/17/2018  . Acute bronchitis with COPD ([place]) 05/12/2018  . Boil 05/12/2018  . It band syndrome, left 12/29/2017  . Trochanteric bursitis, left hip 12/29/2017  . Cough 08/05/2017  . Dysphagia 04/29/2017  . History of left knee replacement 04/21/2017  . Pain in left ankle and joints of left foot 04/21/2017  . Pain in left hand 04/21/2017  . OSA (obstructive sleep apnea) 02/23/2017  . Acute gouty arthritis 12/25/2016  . Stroke ([place])   . Chronic fatigue 11/09/2016  . Hypoxia 11/09/2016  . Morbidly obese ([place]) 11/09/2016  . Rash and nonspecific skin eruption 07/21/2016  . Frequent headaches 07/21/2016  . Allergic rhinitis 07/16/2016  . Vitamin D deficiency 04/16/2016  . Hyperlipidemia 01/15/2016  . Gait disorder 12/04/2015  . Diabetes ([place]) 11/26/2015  . Chronic diastolic congestive heart failure ([place]) 11/06/2015  . TIA (transient ischemic attack)   . Carotid-cavernous fistula   . Chest pain 10/31/2015  . Insomnia 10/10/2015  . Depression 10/10/2015  . Benign essential HTN 08/15/2015  . Fibromyalgia 08/15/2015  . Peripheral neuropathy 08/15/2015  . CAD (coronary artery disease)  08/15/2015  . COPD (chronic obstructive pulmonary disease) ([place]) 08/15/2015  . Osteoporosis 08/15/2015  . 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 DDD (degenerative disc disease), lumbar 08/15/2015  . Occupational exposure to industrial toxins 08/15/2015  . GERD (gastroesophageal reflux disease) 08/15/2015    Current Outpatient Medications on File Prior to Visit  Medication Sig Dispense Refill  . amitriptyline (ELAVIL) 50 MG tablet Take 1 tablet (50 mg total) by mouth at bedtime. Needs follow up for more refills. 30 tablet 1  . aspirin EC 81 MG tablet Take 1 tablet (81 mg total) by mouth daily.    Marland Kitchen atorvastatin (LIPITOR) 20 MG tablet Hold until instructed to restart by your primary doctor    . cloNIDine (CATAPRES) 0.3 MG tablet Take 1 tablet (0.3 mg total) by mouth daily. 30 tablet 11  . diclofenac sodium (VOLTAREN) 1 % GEL Apply 2 g topically 4 (four) times daily as needed. 100 g 0  . fluticasone (FLONASE) 50 MCG/ACT nasal spray Shake liquid & use 1 spray into each nostril every day as needed for allergies 16 g 2  . furosemide (LASIX) 80 MG tablet Take 1 tablet (80 mg total) by mouth daily. 30 tablet 11  . gabapentin (NEURONTIN) 600 MG tablet Take 2 tablets by mouth 3 times a day 180 tablet 3  . glipiZIDE (GLUCOTROL) 5 MG tablet  TAKE 1 TABLET(5 MG) BY MOUTH TWICE DAILY 180 tablet 0  . glucose blood test strip Use as instructed to check sugar three times a day 200 each 5  . ipratropium-albuterol (DUONEB) 0.5-2.5 (3) MG/3ML SOLN Take 3 mLs by nebulization every 4 (four) hours as needed. (Patient taking differently: Take 3 mLs by nebulization every 4 (four) hours as needed (SOB). ) 120 mL 0  . Lancets MISC Use as directed twice per day 200 each 5  . losartan (COZAAR) 50 MG tablet Take 1 tablet by mouth twice daily 60 tablet 11  . metoprolol (TOPROL-XL) 200 MG 24 hr tablet Take 1 tablet by mouth twice daily 60 tablet 11  . mometasone (NASONEX) 50 MCG/ACT nasal spray Place 2 sprays into the nose daily.  (Patient taking differently: Place 2 sprays into the nose daily as needed (allergies). ) 51 g 1  . nitroGLYCERIN (NITROSTAT) 0.4 MG SL tablet Place 0.4 mg under the tongue every 5 (five) minutes as needed for chest pain.    Marland Kitchen omeprazole (PRILOSEC) 40 MG capsule Take 1 tablet by mouth every day 90 capsule 1  . oxyCODONE-acetaminophen (PERCOCET) 10-325 MG tablet Take 1 tablet by mouth every 6 (six) hours as needed for pain.   0  . potassium chloride SA (K-DUR,KLOR-CON) 20 MEQ tablet Take 1 tablet by mouth every day 30 tablet 11  . sitaGLIPtin (JANUVIA) 100 MG tablet Take 1 tablet (100 mg total) by mouth daily. 30 tablet 5  . SYMBICORT 80-4.5 MCG/ACT inhaler Inhale 2 Puffs by mouth twice daily (Patient taking differently: Inhale 2 puffs into the lungs daily as needed (SOB). ) 1 Inhaler 3  . traZODone (DESYREL) 50 MG tablet Take 1 tablet (50 mg total) by mouth at bedtime as needed. for sleep. Need follow up for more refills. 30 tablet 0   No current facility-administered medications on file prior to visit.     Past Medical History:  Diagnosis Date  . Anxiety   . Arthritis   . CHF (congestive heart failure) ([place])   . COPD (chronic obstructive pulmonary disease) ([place])   . Coronary artery disease   . Depression   . Diabetes mellitus without complication ([place])    type 2   . GERD (gastroesophageal reflux disease)   . Hypertension   . Lupus ([place])   . Migraines   . Neuromuscular disorder ([place])   . Osteoporosis   . Oxygen deficiency    pt uses 2.5L 02 at night   . Peripheral neuropathy   . Shingles   . Sleep apnea    had sleep study done recently ; unaware if she will be getting  a CPAP device ; patient states "im pretty sure i have it , i fall alseep all the time "  . Stroke [facility]) 10/2015    Past Surgical History:  Procedure Laterality Date  . ABDOMINAL HYSTERECTOMY     ovaries left  . APPENDECTOMY    . CHOLECYSTECTOMY    . IR GENERIC HISTORICAL  11/07/2015   IR ANGIO INTRA EXTRACRAN  SEL COM CAROTID INNOMINATE BILAT MOD SED 11/07/2015 Luanne Bras, MD [facility]  . IR GENERIC HISTORICAL  11/07/2015   IR ANGIO VERTEBRAL SEL SUBCLAVIAN INNOMINATE UNI R MOD SED 11/07/2015 Luanne Bras, MD [facility]  . IR GENERIC HISTORICAL  11/07/2015   IR ANGIO VERTEBRAL SEL VERTEBRAL UNI L MOD SED 11/07/2015 Luanne Bras, MD [facility]  . IR GENERIC HISTORICAL  11/07/2015   IR ANGIOGRAM EXTREMITY LEFT 11/07/2015  Luanne Bras, MD [facility]  . TONSILLECTOMY    . TOTAL KNEE ARTHROPLASTY Left 03/12/2017   Procedure: LEFT TOTAL KNEE ARTHROPLASTY;  Surgeon: Mcarthur Rossetti, MD;  Location: [facility];  Service: Orthopedics;  Laterality: Left;  Adductor Block    Social History   Socioeconomic History  . Marital status: Single    Spouse name: n/a  . Number of children: 3  . Years of education: 12+  . Highest education level: Not on file  Occupational History  . 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 Financial resource strain: Not on file  . Food insecurity    Worry: Not on file    Inability: Not on file  . Transportation needs    Medical: Not on file    Non-medical: Not on file  Tobacco Use  . Smoking status: Current Every Day Smoker    Packs/day: 0.25    Years: 35.00    Pack years: 8.75    Types: Cigarettes  . Smokeless tobacco: Never Used  . Tobacco comment: referred  to smoking  cessation  classes. at  [facility]   Substance and Sexual Activity  . Alcohol use: No  . Drug use: No  . Sexual activity: Not Currently    Partners: Female    Birth control/protection: Surgical    Comment: hysterectomy  Lifestyle  . Physical activity    Days per week: Not on file    Minutes per session: Not on file  . Stress: Not on file  Relationships  . Social Herbalist on phone: Not on file    Gets together: Not on file    Attends religious service: Not on  file    Active member of club or organization: Not on file    Attends meetings of clubs or organizations: Not on file    Relationship status: Not on file  Other Topics Concern  . Not on file  Social History Narrative   Moved to [place] from [place], [place] February 2017, to help her daughter.   Lives with her daughter.   Sons live in [place] and [place].   She reports that there were originally 17 children in her family (she is the youngest), the oldest are deceased, some prior to her birth, and she isn't sure which were female/female or how they died.    Family History  Problem Relation Age of Onset  . Hyperlipidemia Mother   . Hypertension Mother   . Stroke Mother   . Thyroid disease Mother   . Heart attack Mother   . Hyperlipidemia Father   . Hypertension Father   . Stroke Father   . Heart attack Father   . Hypertension Sister   . Cancer Sister        breast cancer  . Stroke Sister   . Thyroid disease Sister   . Crohn's disease Sister   . Hypertension Sister   . Hypertension Brother   . Diabetes Brother   . Hypertension Brother     Review of Systems     Objective:  There were no vitals filed for this visit. BP Readings from Last 3 Encounters:  06/22/18 110/78  06/17/18 (!) 147/96  06/14/18 (!) 133/95   Wt Readings from Last 3 Encounters:  06/22/18 (!) 321 lb (145.6 kg)  06/17/18 (!) 320 lb (145.2 kg)  05/17/18 (!) 310 lb (140.6 kg)   There is no height  or weight on file to calculate BMI.   Physical Exam    Constitutional: Appears well-developed and well-nourished. No distress.  HENT:  Head: Normocephalic and atraumatic.  Neck: Neck supple. No tracheal deviation present. No thyromegaly present.  No cervical lymphadenopathy Cardiovascular: Normal rate, regular rhythm and normal heart sounds.   No murmur heard. No carotid bruit .  No edema Pulmonary/Chest: Effort normal and breath sounds normal. No respiratory distress. No has no wheezes. No rales.  Skin:  Skin is warm and dry. Not diaphoretic.  Psychiatric: Normal mood and affect. Behavior is normal.      Assessment & Plan:    See Problem List for Assessment and Plan of chronic medical problems.

## 2018-09-26 ENCOUNTER — Encounter: Payer: Self-pay | Admitting: Internal Medicine

## 2018-09-26 ENCOUNTER — Ambulatory Visit (INDEPENDENT_AMBULATORY_CARE_PROVIDER_SITE_OTHER): Payer: Medicare Other | Admitting: Internal Medicine

## 2018-09-26 ENCOUNTER — Other Ambulatory Visit: Payer: Self-pay

## 2018-09-26 DIAGNOSIS — E872 Acidosis, unspecified: Secondary | ICD-10-CM

## 2018-09-26 DIAGNOSIS — E7849 Other hyperlipidemia: Secondary | ICD-10-CM

## 2018-09-26 DIAGNOSIS — E119 Type 2 diabetes mellitus without complications: Secondary | ICD-10-CM | POA: Diagnosis not present

## 2018-09-26 DIAGNOSIS — G4700 Insomnia, unspecified: Secondary | ICD-10-CM

## 2018-09-26 MED ORDER — AMITRIPTYLINE HCL 75 MG PO TABS: 75.0000 mg | ORAL_TABLET | Freq: Every day | ORAL | 5 refills | Status: DC

## 2018-09-26 NOTE — Assessment & Plan Note (Addendum)
Atorvastatin on hold due to elevated LFTs LFT elevation may be related to fatty liver, but this is new recently We will continue to hold-check lipid panel, CMP, TSH

## 2018-09-26 NOTE — Assessment & Plan Note (Signed)
Initially thought to be related to metformin and dehydration, metformin was discontinued Lactic acid has remained elevated, but slowly is decreasing Will recheck

## 2018-09-26 NOTE — Assessment & Plan Note (Signed)
Having difficulty sleeping She does not feel the trazodone works-we will discontinue Increase amitriptyline to 75 mg nightly-can increase further if needed

## 2018-09-26 NOTE — Progress Notes (Signed)
Virtual Visit via Video Note  I connected with Tammy Strickland on 09/26/18 at 10:30 AM EDT by a video enabled telemedicine application and verified that I am speaking with the correct person using two identifiers.   I discussed the limitations of evaluation and management by telemedicine and the availability of in person appointments. The patient expressed understanding and agreed to proceed.  The patient is currently in the car in the passenger seat and I am in the office.    No referring provider.    History of Present Illness: She is here for follow up of her chronic medical conditions.    Diabetes: She is taking her medication daily as prescribed. She is somewhat compliant with a diabetic diet.  She monitors her sugars and they have been running 200's in am.  She is not exercising regularly.  Pulled muscle: She thinks she may have pulled a muscle on her right side.  The pain started after trying to help her sister move her TV table.  Yesterday she had pain with trying to get up and with moving in certain positions.  She does not have pain at rest.  Insomnia: She is currently taking amitriptyline 50 mg nightly and trazodone 50 mg nightly.  She states she does not sleep well.  She has had some deaths in the family and there has been some increase in stress.  She is also no longer taking the tizanidine, which was also helping with her sleep.  She wondered what could be given to her to help her rest.  Lactic acidosis: She had an elevated lactic acid level of the cause was not known.  She denies any muscle pain.  She is concerned that that level is still high.     Review of Systems  Constitutional: Negative for chills and fever.  Respiratory: Positive for cough (smoking). Negative for shortness of breath and wheezing.   Cardiovascular: Positive for leg swelling. Negative for chest pain and palpitations.  Musculoskeletal: Positive for back pain and myalgias.       Pulled muscle on right  side  Neurological: Positive for headaches.     Social History   Socioeconomic History  . Marital status: Single    Spouse name: n/a  . Number of children: 3  . Years of education: 12+  . Highest education level: Not on file  Occupational History  . 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 Financial resource strain: Not on file  . Food insecurity    Worry: Not on file    Inability: Not on file  . Transportation needs    Medical: Not on file    Non-medical: Not on file  Tobacco Use  . Smoking status: Current Every Day Smoker    Packs/day: 0.25    Years: 35.00    Pack years: 8.75    Types: Cigarettes  . Smokeless tobacco: Never Used  . Tobacco comment: referred  to smoking  cessation  classes. at  [facility]   Substance and Sexual Activity  . Alcohol use: No  . Drug use: No  . Sexual activity: Not Currently    Partners: Female    Birth control/protection: Surgical    Comment: hysterectomy  Lifestyle  . Physical activity    Days per week: Not on file    Minutes per session: Not on file  . Stress: Not on file  Relationships  . Social connections  Talks on phone: Not on file    Gets together: Not on file    Attends religious service: Not on file    Active member of club or organization: Not on file    Attends meetings of clubs or organizations: Not on file    Relationship status: Not on file  Other Topics Concern  . Not on file  Social History Narrative   Moved to [place] from [place], [place] February 2017, to help her daughter.   Lives with her daughter.   Sons live in [place] and [place].   She reports that there were originally 17 children in her family (she is the youngest), the oldest are deceased, some prior to her birth, and she isn't sure which were female/female or how they died.     Observations/Objective: Appears well in NAD   Assessment and Plan:  See  Problem List for Assessment and Plan of chronic medical problems.   Follow Up Instructions:    I discussed the assessment and treatment plan with the patient. The patient was provided an opportunity to ask questions and all were answered. The patient agreed with the plan and demonstrated an understanding of the instructions.   The patient was advised to call back or seek an in-person evaluation if the symptoms worsen or if the condition fails to improve as anticipated.  Blood work ordered.  Follow-up to be determined after blood work comes back.  Binnie Rail, MD

## 2018-09-26 NOTE — Assessment & Plan Note (Signed)
Sugars not ideally controlled at home At her last visit 1 month ago we increased the Ozempic to 0.5 mg weekly and most likely she needs 1 mg weekly, but will go ahead and check blood work first Continue glipizide Continue Januvia  Follow-up depending on what A1c shows

## 2018-09-28 ENCOUNTER — Other Ambulatory Visit: Payer: Self-pay | Admitting: Internal Medicine

## 2018-10-10 DIAGNOSIS — G894 Chronic pain syndrome: Secondary | ICD-10-CM | POA: Diagnosis not present

## 2018-10-10 DIAGNOSIS — Z79891 Long term (current) use of opiate analgesic: Secondary | ICD-10-CM | POA: Diagnosis not present

## 2018-10-10 DIAGNOSIS — M25559 Pain in unspecified hip: Secondary | ICD-10-CM | POA: Diagnosis not present

## 2018-10-10 DIAGNOSIS — M25562 Pain in left knee: Secondary | ICD-10-CM | POA: Diagnosis not present

## 2018-10-10 DIAGNOSIS — M545 Low back pain: Secondary | ICD-10-CM | POA: Diagnosis not present

## 2018-10-21 ENCOUNTER — Other Ambulatory Visit: Payer: Self-pay | Admitting: Internal Medicine

## 2018-10-24 DIAGNOSIS — J4520 Mild intermittent asthma, uncomplicated: Secondary | ICD-10-CM | POA: Diagnosis not present

## 2018-10-31 NOTE — Progress Notes (Signed)
Subjective:    Patient ID: Tammy Strickland, female    DOB: 1961-03-06, 58 y.o.   MRN: [id number]  HPI The patient is here for an acute visit.   ?  UTI,?  Yeast infection: She has been experiencing urinary symptoms.  She wet her bed twice in the past couple of weeks.  She has increased urinary frequency.  He has also had vulvar itching and wonders if she has a yeast infection.  She denies any urine odor, blood in the urine and dysuria.  She is also having right lower back pain that is different from her usual chronic back pain was concerned about the possibility of a kidney infection.   Boils:  They come up and go down.  She has two in her right groin.  She has a a couple of small bumps that are still there.  She has been sitting in her bathtub and has been trying to get them to drain.  She thinks they have decreased in size and now only small bumps.  She also has an area of irritation near her anus.  It Azarel Banner when she urinates or if she wipes.  If she gets past the area it does not help.  She has been applying Vaseline.  She thinks both of these she has control over, but wanted to mention them.  Panic attacks:  She has bad panic attacks.  She feels SOB and that makes her panic.    Diabetes: She is taking her medication as prescribed.  She feels she has been eating less and eating better.  The highest her sugars for the past two weeks - 186-210, the lowest 99-105.  Hypertension: She is taking her medication daily. She is compliant with a low sodium diet.  She denies chest pain, palpitations,  and regular headaches. She does not monitor her blood pressure at home.     Medications and allergies reviewed with patient and updated if appropriate.  Patient Active Problem List   Diagnosis Date Noted   RUQ pain 06/22/2018   Fatty liver 06/19/2018   Lactic acid acidosis 06/15/2018   Transaminitis 06/15/2018   Chronic bilateral low back pain without sciatica 05/17/2018   Body mass index  50.0-59.9, adult ([place]) 05/17/2018   It band syndrome, left 12/29/2017   Trochanteric bursitis, left hip 12/29/2017   Cough 08/05/2017   Dysphagia 04/29/2017   History of left knee replacement 04/21/2017   Pain in left ankle and joints of left foot 04/21/2017   Pain in left hand 04/21/2017   OSA (obstructive sleep apnea) 02/23/2017   Acute gouty arthritis 12/25/2016   Stroke ([place])    Chronic fatigue 11/09/2016   Hypoxia 11/09/2016   Morbidly obese ([place]) 11/09/2016   Rash and nonspecific skin eruption 07/21/2016   Frequent headaches 07/21/2016   Allergic rhinitis 07/16/2016   Vitamin D deficiency 04/16/2016   Hyperlipidemia 01/15/2016   Gait disorder 12/04/2015   Diabetes ([place]) 11/26/2015   Chronic diastolic congestive heart failure ([place]) 11/06/2015   TIA (transient ischemic attack)    Carotid-cavernous fistula    Chest pain 10/31/2015   Insomnia 10/10/2015   Depression 10/10/2015   Benign essential HTN 08/15/2015   Fibromyalgia 08/15/2015   Peripheral neuropathy 08/15/2015   CAD (coronary artery disease) 08/15/2015   COPD (chronic obstructive pulmonary disease) ([place]) 08/15/2015   Osteoporosis 08/15/2015   Left knee DJD 08/15/2015   DDD (degenerative disc disease), lumbar 08/15/2015   Occupational exposure to industrial toxins 08/15/2015   GERD (  gastroesophageal reflux disease) 08/15/2015    Current Outpatient Medications on File Prior to Visit  Medication Sig Dispense Refill   amitriptyline (ELAVIL) 75 MG tablet Take 1 tablet (75 mg total) by mouth at bedtime. 30 tablet 5   aspirin EC 81 MG tablet Take 1 tablet (81 mg total) by mouth daily.     atorvastatin (LIPITOR) 20 MG tablet Hold until instructed to restart by your primary doctor     cloNIDine (CATAPRES) 0.3 MG tablet Take 1 tablet (0.3 mg total) by mouth daily. 30 tablet 11   diclofenac sodium (VOLTAREN) 1 % GEL Apply 2 g topically 4 (four) times daily as needed. 100 g 0    fluticasone (FLONASE) 50 MCG/ACT nasal spray Shake liquid & use 1 spray into each nostril every day as needed for allergies 16 g 2   furosemide (LASIX) 80 MG tablet Take 1 tablet (80 mg total) by mouth daily. 30 tablet 11   gabapentin (NEURONTIN) 600 MG tablet Take 2 tablets by mouth 3 times a day 180 tablet 3   glipiZIDE (GLUCOTROL) 5 MG tablet Take 1 tablet by mouth twice daily 60 tablet 5   glucose blood test strip Use as instructed to check sugar three times a day 200 each 5   ipratropium-albuterol (DUONEB) 0.5-2.5 (3) MG/3ML SOLN Take 3 mLs by nebulization every 4 (four) hours as needed. (Patient taking differently: Take 3 mLs by nebulization every 4 (four) hours as needed (SOB). ) 120 mL 0   Lancets MISC Use as directed twice per day 200 each 5   losartan (COZAAR) 50 MG tablet Take 1 tablet by mouth twice daily 60 tablet 11   metoprolol (TOPROL-XL) 200 MG 24 hr tablet Take 1 tablet by mouth twice daily 60 tablet 11   mometasone (NASONEX) 50 MCG/ACT nasal spray Place 2 sprays into the nose daily. (Patient taking differently: Place 2 sprays into the nose daily as needed (allergies). ) 51 g 1   nitroGLYCERIN (NITROSTAT) 0.4 MG SL tablet Place 0.4 mg under the tongue every 5 (five) minutes as needed for chest pain.     omeprazole (PRILOSEC) 40 MG capsule Take 1 tablet by mouth every day 90 capsule 1   oxyCODONE-acetaminophen (PERCOCET) 10-325 MG tablet Take 1 tablet by mouth every 6 (six) hours as needed for pain.   0   potassium chloride SA (K-DUR,KLOR-CON) 20 MEQ tablet Take 1 tablet by mouth every day 30 tablet 11   sitaGLIPtin (JANUVIA) 100 MG tablet Take 1 tablet (100 mg total) by mouth daily. 30 tablet 5   SYMBICORT 80-4.5 MCG/ACT inhaler Inhale 2 Puffs by mouth twice daily (Patient taking differently: Inhale 2 puffs into the lungs daily as needed (SOB). ) 1 Inhaler 3   traZODone (DESYREL) 50 MG tablet Take 1 tablet by mouth at bedtime as needed for sleep 30 tablet 5   No  current facility-administered medications on file prior to visit.     Past Medical History:  Diagnosis Date   Anxiety    Arthritis    CHF (congestive heart failure) (HCC)    COPD (chronic obstructive pulmonary disease) (HCC)    Coronary artery disease    Depression    Diabetes mellitus without complication (HCC)    type 2    GERD (gastroesophageal reflux disease)    Hypertension    Lupus (HCC)    Migraines    Neuromuscular disorder ([place])    Osteoporosis    Oxygen deficiency    pt uses 2.5L  02 at night    Peripheral neuropathy    Shingles    Sleep apnea    had sleep study done recently ; unaware if she will be getting  a CPAP device ; patient states "im pretty sure i have it , i fall alseep all the time "   Stroke ([place]) 10/2015    Past Surgical History:  Procedure Laterality Date   ABDOMINAL HYSTERECTOMY     ovaries left   APPENDECTOMY     CHOLECYSTECTOMY     IR GENERIC HISTORICAL  11/07/2015   IR ANGIO INTRA EXTRACRAN SEL COM CAROTID INNOMINATE BILAT MOD SED 11/07/2015 Luanne Bras, MD [facility]   IR GENERIC HISTORICAL  11/07/2015   IR ANGIO VERTEBRAL SEL SUBCLAVIAN INNOMINATE UNI R MOD SED 11/07/2015 Luanne Bras, MD [facility]   IR GENERIC HISTORICAL  11/07/2015   IR ANGIO VERTEBRAL SEL VERTEBRAL UNI L MOD SED 11/07/2015 Luanne Bras, MD [facility]   IR GENERIC HISTORICAL  11/07/2015   IR ANGIOGRAM EXTREMITY LEFT 11/07/2015 Luanne Bras, MD [facility]   TONSILLECTOMY     TOTAL KNEE ARTHROPLASTY Left 03/12/2017   Procedure: LEFT TOTAL KNEE ARTHROPLASTY;  Surgeon: Mcarthur Rossetti, MD;  Location: [facility];  Service: Orthopedics;  Laterality: Left;  Adductor Block    Social History   Socioeconomic History   Marital status: Single    Spouse name: n/a   Number of children: 3   Years of education: 12+   Highest education level: Not on file  Occupational History   Occupation: disabled-falling, doesn't  recall name of toxin    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 at  [facility]   Substance and Sexual Activity   Alcohol use: No   Drug use: No   Sexual activity: Not Currently    Partners: Female    Birth control/protection: Surgical    Comment: hysterectomy  Lifestyle   Physical activity    Days per week: Not on file    Minutes per session: Not on file   Stress: Not on file  Relationships   Social connections    Talks on phone: Not on file    Gets together: Not on file    Attends religious service: Not on file    Active member of club or organization: Not on file    Attends meetings of clubs or organizations: Not on file    Relationship status: Not on file  Other Topics Concern   Not on file  Social History Narrative   Moved to [place] from [place], [place] February 2017, to help her daughter.   Lives with her daughter.   Sons live in [place] and [place].   She reports that there were originally 17 children in her family (she is the youngest), the oldest are deceased, some prior to her birth, and she isn't sure which were female/female or how they died.    Family History  Problem Relation Age  of Onset   Hyperlipidemia Mother    Hypertension Mother    Stroke Mother    Thyroid disease Mother    Heart attack Mother    Hyperlipidemia Father    Hypertension Father    Stroke Father    Heart attack Father    Hypertension Sister    Cancer Sister        breast cancer   Stroke Sister    Thyroid disease Sister     Crohn's disease Sister    Hypertension Sister    Hypertension Brother    Diabetes Brother    Hypertension Brother     Review of Systems  Constitutional: Negative for fever.  Respiratory: Positive for cough and shortness of breath. Negative for wheezing.   Cardiovascular: Positive for leg swelling. Negative for chest pain and palpitations.  Gastrointestinal: Negative for abdominal pain and nausea.  Genitourinary: Positive for enuresis (x 2 at night over the past week) and frequency. Negative for dysuria and hematuria.       Vulvar itching, no urine odor  Musculoskeletal: Positive for back pain (right lower back).  Skin: Positive for wound.  Neurological: Positive for light-headedness. Negative for headaches.  Psychiatric/Behavioral: Negative for dysphoric mood. The patient is nervous/anxious.        Objective:   Vitals:   11/01/18 1041  BP: (!) 154/92  Pulse: 68  Resp: 18  Temp: 98.5 F (36.9 C)  SpO2: 94%   BP Readings from Last 3 Encounters:  11/01/18 (!) 154/92  06/22/18 110/78  06/17/18 (!) 147/96   Wt Readings from Last 3 Encounters:  11/01/18 (!) 314 lb (142.4 kg)  06/22/18 (!) 321 lb (145.6 kg)  06/17/18 (!) 320 lb (145.2 kg)   Body mass index is 51.46 kg/m.   Physical Exam    Constitutional: Appears well-developed and well-nourished. No distress.  HENT:  Head: Normocephalic and atraumatic.  Neck: Neck supple. No tracheal deviation present. No thyromegaly present.  No cervical lymphadenopathy Cardiovascular: Normal rate, regular rhythm and normal heart sounds.   No murmur heard. No carotid bruit .  No edema Pulmonary/Chest: Effort normal and breath sounds normal. No respiratory distress. No has no wheezes. No rales.  Skin: Skin is warm and dry. Not diaphoretic. deferred looking at boils Psychiatric: Normal mood and affect. Behavior is normal.       Assessment & Plan:    See Problem List for Assessment and Plan of chronic medical problems.

## 2018-11-01 ENCOUNTER — Ambulatory Visit: Payer: Medicare Other | Admitting: Internal Medicine

## 2018-11-01 ENCOUNTER — Encounter: Payer: Self-pay | Admitting: Internal Medicine

## 2018-11-01 ENCOUNTER — Other Ambulatory Visit (INDEPENDENT_AMBULATORY_CARE_PROVIDER_SITE_OTHER): Payer: Medicare Other

## 2018-11-01 ENCOUNTER — Other Ambulatory Visit: Payer: Medicare Other

## 2018-11-01 ENCOUNTER — Ambulatory Visit (INDEPENDENT_AMBULATORY_CARE_PROVIDER_SITE_OTHER): Payer: Medicare Other | Admitting: Internal Medicine

## 2018-11-01 ENCOUNTER — Other Ambulatory Visit: Payer: Self-pay

## 2018-11-01 VITALS — BP 154/92 | HR 68 | Temp 98.5°F | Resp 18 | Ht 65.5 in | Wt 314.0 lb

## 2018-11-01 DIAGNOSIS — I10 Essential (primary) hypertension: Secondary | ICD-10-CM

## 2018-11-01 DIAGNOSIS — E872 Acidosis, unspecified: Secondary | ICD-10-CM

## 2018-11-01 DIAGNOSIS — E119 Type 2 diabetes mellitus without complications: Secondary | ICD-10-CM | POA: Diagnosis not present

## 2018-11-01 DIAGNOSIS — B373 Candidiasis of vulva and vagina: Secondary | ICD-10-CM | POA: Diagnosis not present

## 2018-11-01 DIAGNOSIS — F419 Anxiety disorder, unspecified: Secondary | ICD-10-CM | POA: Insufficient documentation

## 2018-11-01 DIAGNOSIS — R350 Frequency of micturition: Secondary | ICD-10-CM | POA: Insufficient documentation

## 2018-11-01 DIAGNOSIS — E7849 Other hyperlipidemia: Secondary | ICD-10-CM

## 2018-11-01 DIAGNOSIS — B3731 Acute candidiasis of vulva and vagina: Secondary | ICD-10-CM

## 2018-11-01 LAB — POCT URINALYSIS DIPSTICK
Bilirubin, UA: NEGATIVE
Blood, UA: NEGATIVE
Glucose, UA: NEGATIVE
Ketones, UA: NEGATIVE
Leukocytes, UA: NEGATIVE
Nitrite, UA: NEGATIVE
Protein, UA: NEGATIVE
Spec Grav, UA: <=1.005 — AB (ref 1.010–1.025)
Urobilinogen, UA: NEGATIVE E.U./dL — AB
pH, UA: 6 (ref 5.0–8.0)

## 2018-11-01 LAB — COMPREHENSIVE METABOLIC PANEL
ALT: 38 U/L — ABNORMAL HIGH (ref 0–35)
AST: 88 U/L — ABNORMAL HIGH (ref 0–37)
Albumin: 3.8 g/dL (ref 3.5–5.2)
Alkaline Phosphatase: 119 U/L — ABNORMAL HIGH (ref 39–117)
BUN: 9 mg/dL (ref 6–23)
CO2: 31 mEq/L (ref 19–32)
Calcium: 8.9 mg/dL (ref 8.4–10.5)
Chloride: 103 mEq/L (ref 96–112)
Creatinine, Ser: 0.69 mg/dL (ref 0.40–1.20)
GFR: 106.27 mL/min (ref >60.00)
Glucose, Bld: 162 mg/dL — ABNORMAL HIGH (ref 70–99)
Potassium: 4.7 mEq/L (ref 3.5–5.1)
Sodium: 140 mEq/L (ref 135–145)
Total Bilirubin: 0.3 mg/dL (ref 0.2–1.2)
Total Protein: 6.8 g/dL (ref 6.0–8.3)

## 2018-11-01 LAB — LIPID PANEL
Cholesterol: 148 mg/dL (ref 0–200)
HDL: 46.6 mg/dL (ref >39.00)
LDL Cholesterol: 79 mg/dL (ref 0–99)
NonHDL: 100.97
Total CHOL/HDL Ratio: 3
Triglycerides: 108 mg/dL (ref 0.0–149.0)
VLDL: 21.6 mg/dL (ref 0.0–40.0)

## 2018-11-01 LAB — TSH: TSH: 1.67 u[IU]/mL (ref 0.35–4.50)

## 2018-11-01 LAB — HEMOGLOBIN A1C: Hgb A1c MFr Bld: 7.7 % — ABNORMAL HIGH (ref 4.6–6.5)

## 2018-11-01 MED ORDER — FLUCONAZOLE 150 MG PO TABS: 150.0000 mg | ORAL_TABLET | Freq: Once | ORAL | 0 refills | Status: AC

## 2018-11-01 NOTE — Assessment & Plan Note (Signed)
Increased anxiety with panic attacks Given other medications I do not want to start BDZ and she is already on elavil and trazodone so I am concerned about starting an SSRI Advised seeing a psychiatrist for medication advice, but she deferred No change in medications

## 2018-11-01 NOTE — Assessment & Plan Note (Signed)
Will recheck a1c today Taking ozempic and januvia Has lost weight - stressed continue weight loss efforts - increase activity, decrease portions

## 2018-11-01 NOTE — Assessment & Plan Note (Signed)
BP slightly elevated today She is in increased pain with acute issues Will continue current medications

## 2018-11-01 NOTE — Assessment & Plan Note (Signed)
?   UTI Urine dip not suggestive of UTI Will send for culture

## 2018-11-01 NOTE — Assessment & Plan Note (Signed)
Vulvar itching Probable yeast infection dilfucan 150 mg x 1

## 2018-11-01 NOTE — Patient Instructions (Addendum)
  Tests ordered today. Your results will be released to [place] (or called to you) after review.  If any changes need to be made, you will be notified at that same time.   Medications reviewed and updated.  Changes include :  Diflucan once for a yeast infection.   Your prescription(s) have been submitted to your pharmacy. Please take as directed and contact our office if you believe you are having problem(s) with the medication(s).   Please followup in 3 months

## 2018-11-02 LAB — LACTIC ACID, PLASMA: LACTIC ACID: 2.5 mmol/L — ABNORMAL HIGH (ref 0.4–1.8)

## 2018-11-03 LAB — URINE CULTURE
MICRO NUMBER:: 688645
Result:: NO GROWTH
SPECIMEN QUALITY:: ADEQUATE

## 2018-11-06 ENCOUNTER — Other Ambulatory Visit: Payer: Self-pay | Admitting: Internal Medicine

## 2018-11-07 DIAGNOSIS — M545 Low back pain: Secondary | ICD-10-CM | POA: Diagnosis not present

## 2018-11-07 DIAGNOSIS — G894 Chronic pain syndrome: Secondary | ICD-10-CM | POA: Diagnosis not present

## 2018-11-07 DIAGNOSIS — M25562 Pain in left knee: Secondary | ICD-10-CM | POA: Diagnosis not present

## 2018-11-07 DIAGNOSIS — M25559 Pain in unspecified hip: Secondary | ICD-10-CM | POA: Diagnosis not present

## 2018-11-07 DIAGNOSIS — Z79891 Long term (current) use of opiate analgesic: Secondary | ICD-10-CM | POA: Diagnosis not present

## 2018-11-08 ENCOUNTER — Telehealth: Payer: Self-pay | Admitting: Emergency Medicine

## 2018-11-08 MED ORDER — DOXYCYCLINE HYCLATE 100 MG PO TABS: 100.0000 mg | ORAL_TABLET | Freq: Two times a day (BID) | ORAL | 0 refills | Status: DC

## 2018-11-08 NOTE — Telephone Encounter (Signed)
Pt states she has a boil and would like antibiotic sent to [facility]. Per Dr Quay Burow, RX sent to pharmacy.

## 2018-11-16 ENCOUNTER — Other Ambulatory Visit: Payer: Self-pay | Admitting: Internal Medicine

## 2018-11-16 MED ORDER — OZEMPIC (1 MG/DOSE) 2 MG/1.5ML ~~LOC~~ SOPN: 1.0000 mg | PEN_INJECTOR | SUBCUTANEOUS | 5 refills | Status: DC

## 2018-11-24 DIAGNOSIS — J4520 Mild intermittent asthma, uncomplicated: Secondary | ICD-10-CM | POA: Diagnosis not present

## 2018-12-05 DIAGNOSIS — Z79891 Long term (current) use of opiate analgesic: Secondary | ICD-10-CM | POA: Diagnosis not present

## 2018-12-05 DIAGNOSIS — M25562 Pain in left knee: Secondary | ICD-10-CM | POA: Diagnosis not present

## 2018-12-05 DIAGNOSIS — G894 Chronic pain syndrome: Secondary | ICD-10-CM | POA: Diagnosis not present

## 2018-12-05 DIAGNOSIS — M25559 Pain in unspecified hip: Secondary | ICD-10-CM | POA: Diagnosis not present

## 2018-12-05 DIAGNOSIS — M545 Low back pain: Secondary | ICD-10-CM | POA: Diagnosis not present

## 2018-12-25 ENCOUNTER — Other Ambulatory Visit: Payer: Self-pay | Admitting: Internal Medicine

## 2018-12-25 DIAGNOSIS — J4520 Mild intermittent asthma, uncomplicated: Secondary | ICD-10-CM | POA: Diagnosis not present

## 2019-01-04 DIAGNOSIS — Z79891 Long term (current) use of opiate analgesic: Secondary | ICD-10-CM | POA: Diagnosis not present

## 2019-01-04 DIAGNOSIS — M25559 Pain in unspecified hip: Secondary | ICD-10-CM | POA: Diagnosis not present

## 2019-01-04 DIAGNOSIS — M25562 Pain in left knee: Secondary | ICD-10-CM | POA: Diagnosis not present

## 2019-01-04 DIAGNOSIS — G894 Chronic pain syndrome: Secondary | ICD-10-CM | POA: Diagnosis not present

## 2019-01-04 DIAGNOSIS — M545 Low back pain: Secondary | ICD-10-CM | POA: Diagnosis not present

## 2019-01-16 ENCOUNTER — Encounter: Payer: Self-pay | Admitting: Orthopaedic Surgery

## 2019-01-16 ENCOUNTER — Ambulatory Visit: Payer: Self-pay

## 2019-01-16 ENCOUNTER — Ambulatory Visit (INDEPENDENT_AMBULATORY_CARE_PROVIDER_SITE_OTHER): Payer: Medicare Other | Admitting: Orthopaedic Surgery

## 2019-01-16 VITALS — Ht 65.5 in | Wt 314.0 lb

## 2019-01-16 DIAGNOSIS — M1711 Unilateral primary osteoarthritis, right knee: Secondary | ICD-10-CM | POA: Diagnosis not present

## 2019-01-16 DIAGNOSIS — M25561 Pain in right knee: Secondary | ICD-10-CM | POA: Diagnosis not present

## 2019-01-16 DIAGNOSIS — M25552 Pain in left hip: Secondary | ICD-10-CM | POA: Diagnosis not present

## 2019-01-16 DIAGNOSIS — G8929 Other chronic pain: Secondary | ICD-10-CM | POA: Diagnosis not present

## 2019-01-16 DIAGNOSIS — M7062 Trochanteric bursitis, left hip: Secondary | ICD-10-CM

## 2019-01-16 MED ORDER — LIDOCAINE HCL 1 % IJ SOLN
3.0000 mL | INTRAMUSCULAR | Status: AC | PRN
  Administered 2019-01-16: 3 mL

## 2019-01-16 MED ORDER — METHYLPREDNISOLONE ACETATE 40 MG/ML IJ SUSP
40.0000 mg | INTRAMUSCULAR | Status: AC | PRN
  Administered 2019-01-16: 40 mg via INTRA_ARTICULAR

## 2019-01-16 NOTE — Progress Notes (Signed)
Office Visit Note   Patient: Tammy Strickland           Date of Birth: 1961-03-01           MRN: [id number] Visit Date: 01/16/2019              Requested by: Tammy Rail, MD [place],  [place] [address] PCP: Tammy Rail, MD   Assessment & Plan: Visit Diagnoses:  1. Chronic pain of right knee   2. Pain in left hip   3. Unilateral primary osteoarthritis, right knee   4. Trochanteric bursitis, left hip     Plan: I do feel her biggest issue is her right knee and that slowing her gait off on the left side.  I explained with trochanteric bursitis and hip bursitis means on the left side and did recommend a steroid injection in this area which she agreed to and tolerated well.  I did explain the risk and benefits of steroid injections.  As far as her right knee goes, she may end up needing knee replacement.  I recommended certainly weight loss and have also recommended a steroid injection of the right knee.  She did tolerate the steroid injection her right knee well.  I would like to reevaluate her in 4 weeks.  All question concerns were answered and addressed.  Follow-Up Instructions: Return in about 4 weeks (around 02/13/2019).   Orders:  Orders Placed This Encounter  Procedures  . Large Joint Inj  . Large Joint Inj  . XR KNEE 3 VIEW RIGHT  . XR HIP UNILAT W OR W/O PELVIS 2-3 VIEWS LEFT   No orders of the defined types were placed in this encounter.     Procedures: Large Joint Inj: R knee on 01/16/2019 9:05 AM Indications: diagnostic evaluation and pain Details: 22 G 1.5 in needle, superolateral approach  Arthrogram: No  Medications: 3 mL lidocaine 1 %; 40 mg methylPREDNISolone acetate 40 MG/ML Outcome: tolerated well, no immediate complications Procedure, treatment alternatives, risks and benefits explained, specific risks discussed. Consent was given by the patient. Immediately prior to procedure a time out was called to verify the correct patient, procedure,  equipment, support staff and site/side marked as required. Patient was prepped and draped in the usual sterile fashion.   Large Joint Inj: L greater trochanter on 01/16/2019 9:05 AM Indications: pain and diagnostic evaluation Details: 22 G 1.5 in needle, lateral approach  Arthrogram: No  Medications: 3 mL lidocaine 1 %; 40 mg methylPREDNISolone acetate 40 MG/ML Outcome: tolerated well, no immediate complications Procedure, treatment alternatives, risks and benefits explained, specific risks discussed. Consent was given by the patient. Immediately prior to procedure a time out was called to verify the correct patient, procedure, equipment, support staff and site/side marked as required. Patient was prepped and draped in the usual sterile fashion.       Clinical Data: No additional findings.   Subjective: Chief Complaint  Patient presents with  . Right Knee - Pain  . Left Hip - Pain  The patient comes in today with a flareup of left hip pain and right knee pain.  She is someone who is morbidly obese with a BMI of 51.  We actually replaced her left knee 2 years ago and she is done well with that knee but she is experience some knee pain but I believe is coming from her hip on the left side as well as her right knee.  She is on chronic  pain medications.  Prior to a month ago she was not needing to get around with a walker but now she is having to use a walker.  There is no swelling or locking or grinding but just hurts quite a bit and she feels like the right knee is giving way in her left hip is giving way but she denies any groin pain on the left side.  She denies any other acute changes in her medical status.  HPI  Review of Systems She currently denies any headache, chest pain, shortness of breath, fever, chills, nausea, vomiting  Objective: Vital Signs: Ht 5' 5.5" (1.664 m)   Wt (!) 314 lb (142.4 kg)   BMI 51.46 kg/m   Physical Exam She is alert oriented x3 and in no acute  distress Ortho Exam Examination of her left hip shows full range of motion with no pain in the groin at all.  She does have pain to palpation of the trochanteric area and IT band.  Her left total knee shows a stable exam.  Her right knee hurts on the medial joint line and there is some patellofemoral crepitation.  There is varus malalignment is easily correctable.  There is no right knee effusion and her right knee is ligamentously stable. Specialty Comments:  No specialty comments available.  Imaging: Xr Hip Unilat W Or W/o Pelvis 2-3 Views Left  Result Date: 01/16/2019 An AP pelvis and lateral of the left hip shows no acute findings of the left hip.  Both hips have well-maintained joint space.  Xr Knee 3 View Right  Result Date: 01/16/2019 3 views of the right knee show moderate arthritic changes with varus malalignment.  There is medial joint space narrowing and patellofemoral narrowing.    PMFS History: Patient Active Problem List   Diagnosis Date Noted  . Unilateral primary osteoarthritis, right knee 01/16/2019  . Urinary frequency 11/01/2018  . Vaginal yeast infection 11/01/2018  . Anxiety 11/01/2018  . RUQ pain 06/22/2018  . Fatty liver 06/19/2018  . Lactic acid acidosis 06/15/2018  . Transaminitis 06/15/2018  . Chronic bilateral low back pain without sciatica 05/17/2018  . Body mass index 50.0-59.9, adult ([place]) 05/17/2018  . It band syndrome, left 12/29/2017  . Trochanteric bursitis, left hip 12/29/2017  . Cough 08/05/2017  . Dysphagia 04/29/2017  . History of left knee replacement 04/21/2017  . Pain in left ankle and joints of left foot 04/21/2017  . Pain in left hand 04/21/2017  . OSA (obstructive sleep apnea) 02/23/2017  . Acute gouty arthritis 12/25/2016  . Stroke ([place])   . Chronic fatigue 11/09/2016  . Hypoxia 11/09/2016  . Morbidly obese ([place]) 11/09/2016  . Rash and nonspecific skin eruption 07/21/2016  . Frequent headaches 07/21/2016  . Allergic rhinitis  07/16/2016  . Vitamin D deficiency 04/16/2016  . Hyperlipidemia 01/15/2016  . Gait disorder 12/04/2015  . Diabetes ([place]) 11/26/2015  . Chronic diastolic congestive heart failure ([place]) 11/06/2015  . TIA (transient ischemic attack)   . Carotid-cavernous fistula   . Chest pain 10/31/2015  . Insomnia 10/10/2015  . Depression 10/10/2015  . Benign essential HTN 08/15/2015  . Fibromyalgia 08/15/2015  . Peripheral neuropathy 08/15/2015  . CAD (coronary artery disease) 08/15/2015  . COPD (chronic obstructive pulmonary disease) ([place]) 08/15/2015  . Osteoporosis 08/15/2015  . 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 DDD (degenerative disc disease), lumbar 08/15/2015  . Occupational exposure to industrial toxins 08/15/2015  . GERD (gastroesophageal reflux disease) 08/15/2015   Past Medical History:  Diagnosis  Date  . Anxiety   . Arthritis   . CHF (congestive heart failure) ([place])   . COPD (chronic obstructive pulmonary disease) ([place])   . Coronary artery disease   . Depression   . Diabetes mellitus without complication ([place])    type 2   . GERD (gastroesophageal reflux disease)   . Hypertension   . Lupus ([place])   . Migraines   . Neuromuscular disorder ([place])   . Osteoporosis   . Oxygen deficiency    pt uses 2.5L 02 at night   . Peripheral neuropathy   . Shingles   . Sleep apnea    had sleep study done recently ; unaware if she will be getting  a CPAP device ; patient states "im pretty sure i have it , i fall alseep all the time "  . Stroke [facility]) 10/2015    Family History  Problem Relation Age of Onset  . Hyperlipidemia Mother   . Hypertension Mother   . Stroke Mother   . Thyroid disease Mother   . Heart attack Mother   . Hyperlipidemia Father   . Hypertension Father   . Stroke Father   . Heart attack Father   . Hypertension Sister   . Cancer Sister        breast cancer  . Stroke Sister   . Thyroid disease Sister   . Crohn's disease Sister   . Hypertension Sister   . Hypertension  Brother   . Diabetes Brother   . Hypertension Brother     Past Surgical History:  Procedure Laterality Date  . ABDOMINAL HYSTERECTOMY     ovaries left  . APPENDECTOMY    . CHOLECYSTECTOMY    . IR GENERIC HISTORICAL  11/07/2015   IR ANGIO INTRA EXTRACRAN SEL COM CAROTID INNOMINATE BILAT MOD SED 11/07/2015 Luanne Bras, MD [facility]  . IR GENERIC HISTORICAL  11/07/2015   IR ANGIO VERTEBRAL SEL SUBCLAVIAN INNOMINATE UNI R MOD SED 11/07/2015 Luanne Bras, MD [facility]  . IR GENERIC HISTORICAL  11/07/2015   IR ANGIO VERTEBRAL SEL VERTEBRAL UNI L MOD SED 11/07/2015 Luanne Bras, MD [facility]  . IR GENERIC HISTORICAL  11/07/2015   IR ANGIOGRAM EXTREMITY LEFT 11/07/2015 Luanne Bras, MD [facility]  . TONSILLECTOMY    . TOTAL KNEE ARTHROPLASTY Left 03/12/2017   Procedure: LEFT TOTAL KNEE ARTHROPLASTY;  Surgeon: Mcarthur Rossetti, MD;  Location: [facility];  Service: Orthopedics;  Laterality: Left;  Adductor Block   Social History   Occupational History  . 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 Smoking status: Current Every Day Smoker    Packs/day: 0.25    Years: 35.00    Pack years: 8.75    Types: Cigarettes  . Smokeless tobacco: Never Used  . Tobacco comment: referred  to smoking  cessation  classes. at  [facility]   Substance and Sexual Activity  . Alcohol use: No  . Drug use: No  . Sexual activity: Not Currently    Partners: Female    Birth control/protection: Surgical    Comment: hysterectomy

## 2019-01-24 DIAGNOSIS — J4520 Mild intermittent asthma, uncomplicated: Secondary | ICD-10-CM | POA: Diagnosis not present

## 2019-01-29 ENCOUNTER — Other Ambulatory Visit: Payer: Self-pay | Admitting: Internal Medicine

## 2019-01-30 DIAGNOSIS — G894 Chronic pain syndrome: Secondary | ICD-10-CM | POA: Diagnosis not present

## 2019-01-30 DIAGNOSIS — Z79891 Long term (current) use of opiate analgesic: Secondary | ICD-10-CM | POA: Diagnosis not present

## 2019-01-30 DIAGNOSIS — M25562 Pain in left knee: Secondary | ICD-10-CM | POA: Diagnosis not present

## 2019-01-30 DIAGNOSIS — M25559 Pain in unspecified hip: Secondary | ICD-10-CM | POA: Diagnosis not present

## 2019-01-30 DIAGNOSIS — M545 Low back pain: Secondary | ICD-10-CM | POA: Diagnosis not present

## 2019-01-31 ENCOUNTER — Other Ambulatory Visit: Payer: Self-pay | Admitting: Internal Medicine

## 2019-02-02 ENCOUNTER — Telehealth: Payer: Self-pay | Admitting: Internal Medicine

## 2019-02-02 NOTE — Telephone Encounter (Signed)
Patient called requesting an appointment. She states that her body is weak and she doesn't feel right. She also mentioned that her upper body is achy.  I have scheduled her for a virtual visit with Dr Quay Burow in the morning. Would you mind calling her to triage her to make sure she does not need to be seen sooner. (We are full for the afternoon)

## 2019-02-02 NOTE — Telephone Encounter (Signed)
I have talked with patient---patient states she is numb on her entire left side and has had a headache---she took her blood pressure yesterday, reading 158/98---did not take her bp today yet, and could not find her cuff while we were on the phone, patient will continue trying to find her cuff to take another reading---she checked her sugar (finger stick) while I was on the phone with her, she is 156---no vision problems, no n/v/d, afebrile---I asked patient to look in the mirror and smile to see if facial movements were equal---the left side does not match the right side---so considering patient's history of stroke, I have advised patient to call 911 and go to ED for stroke evaluation--patient doesn't want to do that right now, she states if she worsens any further she will call---I have left patient scheduled for virtual visit with dr burns for tomorrow 02/03/19, but still advised patient should go to ED today---routing to dr burns, fyi.Marland KitchenMarland Kitchen

## 2019-02-02 NOTE — Progress Notes (Signed)
Cancelled. This encounter was created in error - please disregard. 

## 2019-02-03 ENCOUNTER — Encounter: Payer: Medicare Other | Admitting: Internal Medicine

## 2019-02-03 ENCOUNTER — Other Ambulatory Visit: Payer: Self-pay

## 2019-02-03 ENCOUNTER — Encounter: Payer: Self-pay | Admitting: Internal Medicine

## 2019-02-03 MED ORDER — AMITRIPTYLINE HCL 75 MG PO TABS: 75.0000 mg | ORAL_TABLET | Freq: Every day | ORAL | 5 refills | Status: DC

## 2019-02-08 ENCOUNTER — Ambulatory Visit: Payer: Medicare Other | Admitting: Internal Medicine

## 2019-02-08 ENCOUNTER — Telehealth: Payer: Self-pay | Admitting: Internal Medicine

## 2019-02-08 MED ORDER — FLUCONAZOLE 150 MG PO TABS: 150.0000 mg | ORAL_TABLET | Freq: Once | ORAL | 0 refills | Status: AC

## 2019-02-08 NOTE — Telephone Encounter (Signed)
Please advise 

## 2019-02-08 NOTE — Telephone Encounter (Signed)
Diflucan sent to  [facility]

## 2019-02-08 NOTE — Telephone Encounter (Signed)
Pt aware.

## 2019-02-08 NOTE — Telephone Encounter (Signed)
Led and stated that she has a yeast infection and would like something called in. Please advise

## 2019-02-13 ENCOUNTER — Encounter: Payer: Self-pay | Admitting: Orthopaedic Surgery

## 2019-02-13 ENCOUNTER — Other Ambulatory Visit: Payer: Self-pay

## 2019-02-13 ENCOUNTER — Ambulatory Visit (INDEPENDENT_AMBULATORY_CARE_PROVIDER_SITE_OTHER): Payer: Medicare Other | Admitting: Orthopaedic Surgery

## 2019-02-13 VITALS — Ht 66.0 in | Wt 290.0 lb

## 2019-02-13 DIAGNOSIS — M25561 Pain in right knee: Secondary | ICD-10-CM

## 2019-02-13 DIAGNOSIS — M1711 Unilateral primary osteoarthritis, right knee: Secondary | ICD-10-CM | POA: Diagnosis not present

## 2019-02-13 DIAGNOSIS — G8929 Other chronic pain: Secondary | ICD-10-CM

## 2019-02-13 DIAGNOSIS — M25552 Pain in left hip: Secondary | ICD-10-CM

## 2019-02-13 DIAGNOSIS — M7062 Trochanteric bursitis, left hip: Secondary | ICD-10-CM | POA: Diagnosis not present

## 2019-02-13 NOTE — Progress Notes (Signed)
The patient is here with continued left knee pain from known osteoarthritis of the left knee.  She did have a BMI of over 50.  It is down to 46.81.  We did replace her left knee in November 2018.  She has tried and failed other forms conservative treatment from this right knee and is frustrated.  She would like to proceed with knee replacement on the right knee if allowed.  Examination right knee is slightly hyperextends.  There is no effusion but there is varus malalignment and medial joint line tenderness with patellofemoral crepitation as well.  Of note we have tried rest, ice, heat, activity modification, steroid injections, and her working on weight loss.  Her left knee is done decent for her.  She still has lateral thigh and knee pain but on examination of her left knee it is likely stable for range of motion no effusion.  Previous x-rays earlier this year of the right knee show severe osteoarthritis in general disease with medial joint space narrowing and varus malalignment as well as periarticular osteophytes.  At this point we will work on getting her scheduled for a right total knee arthroplasty if insurance will allow for this.  I am comfortable proceeding with the surgery given the fact the patient has lost weight and continues to lose weight and the fact that we did replace her left knee before.  Her right knee soft tissue envelope is not huge and I feel comfortable anatomically with performing the surgery.  We will see if we can get this scheduled.  We have recommended continued weight loss as well.The patient meets the AMA guidelines for Morbid (severe) obesity with a BMI > 40.0 and I have recommended weight loss.

## 2019-02-14 ENCOUNTER — Ambulatory Visit (INDEPENDENT_AMBULATORY_CARE_PROVIDER_SITE_OTHER): Payer: Medicare Other | Admitting: Internal Medicine

## 2019-02-14 ENCOUNTER — Encounter: Payer: Self-pay | Admitting: Internal Medicine

## 2019-02-14 DIAGNOSIS — I5032 Chronic diastolic (congestive) heart failure: Secondary | ICD-10-CM

## 2019-02-14 DIAGNOSIS — E119 Type 2 diabetes mellitus without complications: Secondary | ICD-10-CM

## 2019-02-14 DIAGNOSIS — I10 Essential (primary) hypertension: Secondary | ICD-10-CM | POA: Diagnosis not present

## 2019-02-14 DIAGNOSIS — E7849 Other hyperlipidemia: Secondary | ICD-10-CM

## 2019-02-14 DIAGNOSIS — I25119 Atherosclerotic heart disease of native coronary artery with unspecified angina pectoris: Secondary | ICD-10-CM

## 2019-02-14 DIAGNOSIS — G6289 Other specified polyneuropathies: Secondary | ICD-10-CM

## 2019-02-14 DIAGNOSIS — G4700 Insomnia, unspecified: Secondary | ICD-10-CM

## 2019-02-14 MED ORDER — TIZANIDINE HCL 4 MG PO TABS: 4.0000 mg | ORAL_TABLET | Freq: Every day | ORAL | 1 refills | Status: DC

## 2019-02-14 NOTE — Assessment & Plan Note (Signed)
Taking gabapentin 3 times a day-she is unsure if this helps or not Also on Elavil at night Advised if she decides to come off of gabapentin, which she is considering to do with slowly because she is on a high dose

## 2019-02-14 NOTE — Assessment & Plan Note (Signed)
She is dedicated to losing weight-would like to have her knee replaced in February She will try to start exercising She plans on cutting back significantly on carbohydrates and eating mostly meats and vegetables

## 2019-02-14 NOTE — Assessment & Plan Note (Signed)
Taking Elavil nightly Trazodone not effective-discontinue Would like to retry the tizanidine, which did help with her back pain, spasms and help her sleep It does make her drowsy so we will only do it at night 4 mg at bedtime

## 2019-02-14 NOTE — Assessment & Plan Note (Signed)
Last A1c was above goal, but she has been checking her sugars at home they appear to be well controlled She is decreasing her carbs and trying to eat mostly meat and vegetables She plans on starting to do some exercise and is dedicated to lose weight Recheck A1c in 3 months Continue current medications

## 2019-02-14 NOTE — Assessment & Plan Note (Signed)
Well controlled at home Current regimen effective and well tolerated Continue current medications at current doses

## 2019-02-14 NOTE — Progress Notes (Signed)
Virtual Visit via Video Note  I connected with Tammy Strickland on 02/14/19 at  9:45 AM EST by a video enabled telemedicine application and verified that I am speaking with the correct person using two identifiers.   I discussed the limitations of evaluation and management by telemedicine and the availability of in person appointments. The patient expressed understanding and agreed to proceed.  The patient is currently at home and I am in the office.    No referring provider.    History of Present Illness: She is here for follow up of her chronic medical conditions.   Diabetes: She is taking her medication daily as prescribed. She is compliant with a diabetic diet.  She monitors her sugars and they have been running 119, 97, 154.   Peripheral neuropathy:  She is taking gabapentin three times a day.  She is unsure if it helps.  HFpEF, CAD, Hypertension: She is taking her medication daily. She is compliant with a low sodium diet.  She denies chest pain, palpitations, shortness of breath and regular headaches. She does monitor her blood pressure at home - 119/67.    Hyperlipidemia: She is taking her medication daily. She is compliant with a low fat/cholesterol diet.     Insomnia:  She is taking elavil and trazodone nightly.  The trazodone does not help.  Her chronic back pain affects her sleep and she would like to restart the tizanidine - it helps her back pain and helped her sleep.  She just wants it at night.  GERD:  She is taking her medication daily as prescribed.  She denies any GERD symptoms and feels her GERD is well controlled.   Severe knee OA:  She is following with ortho and will have knee replacement next year if she loses more weight.  She is changing her diet and is working on weight loss.     Review of Systems  Constitutional: Negative for fever.  Respiratory: Negative for cough, shortness of breath and wheezing.   Cardiovascular: Positive for leg swelling. Negative for  chest pain and palpitations.  Neurological: Positive for headaches. Negative for dizziness.     Social History   Socioeconomic History  . Marital status: Single    Spouse name: n/a  . Number of children: 3  . Years of education: 12+  . Highest education level: Not on file  Occupational History  . 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 Financial resource strain: Not on file  . Food insecurity    Worry: Not on file    Inability: Not on file  . Transportation needs    Medical: Not on file    Non-medical: Not on file  Tobacco Use  . Smoking status: Current Every Day Smoker    Packs/day: 0.25    Years: 35.00    Pack years: 8.75    Types: Cigarettes  . Smokeless tobacco: Never Used  . Tobacco comment: referred  to smoking  cessation  classes. at  [facility]   Substance and Sexual Activity  . Alcohol use: No  . Drug use: No  . Sexual activity: Not Currently    Partners: Female    Birth control/protection: Surgical    Comment: hysterectomy  Lifestyle  . Physical activity    Days per week: Not on file    Minutes per session: Not on file  . Stress: Not on file  Relationships  . Social connections  Talks on phone: Not on file    Gets together: Not on file    Attends religious service: Not on file    Active member of club or organization: Not on file    Attends meetings of clubs or organizations: Not on file    Relationship status: Not on file  Other Topics Concern  . Not on file  Social History Narrative   Moved to [place] from [place], [place] February 2017, to help her daughter.   Lives with her daughter.   Sons live in [place] and [place].   She reports that there were originally 17 children in her family (she is the youngest), the oldest are deceased, some prior to her birth, and she isn't sure which were female/female or how they died.     Observations/Objective: Appears  well in NAD Breathing normally Skin warm and dry  Assessment and Plan:  See Problem List for Assessment and Plan of chronic medical problems.   Follow Up Instructions:    I discussed the assessment and treatment plan with the patient. The patient was provided an opportunity to ask questions and all were answered. The patient agreed with the plan and demonstrated an understanding of the instructions.   The patient was advised to call back or seek an in-person evaluation if the symptoms worsen or if the condition fails to improve as anticipated.    Binnie Rail, MD

## 2019-02-14 NOTE — Assessment & Plan Note (Signed)
Sounds to be euvolemic Continue lasix 80 mg daly

## 2019-02-14 NOTE — Assessment & Plan Note (Signed)
She is not experiencing any chest pain, shortness of breath, palpitations or lightheadedness No symptoms consistent with ischemia Continue aspirin 81 mg daily, atorvastatin and metoprolol

## 2019-02-14 NOTE — Assessment & Plan Note (Signed)
Continue atorvastatin

## 2019-02-15 ENCOUNTER — Ambulatory Visit: Payer: Medicare Other | Admitting: Internal Medicine

## 2019-02-23 ENCOUNTER — Other Ambulatory Visit: Payer: Self-pay | Admitting: Internal Medicine

## 2019-02-24 DIAGNOSIS — J4520 Mild intermittent asthma, uncomplicated: Secondary | ICD-10-CM | POA: Diagnosis not present

## 2019-02-27 ENCOUNTER — Other Ambulatory Visit: Payer: Self-pay

## 2019-02-27 DIAGNOSIS — M545 Low back pain: Secondary | ICD-10-CM | POA: Diagnosis not present

## 2019-02-27 DIAGNOSIS — M25559 Pain in unspecified hip: Secondary | ICD-10-CM | POA: Diagnosis not present

## 2019-02-27 DIAGNOSIS — G894 Chronic pain syndrome: Secondary | ICD-10-CM | POA: Diagnosis not present

## 2019-02-27 DIAGNOSIS — M25562 Pain in left knee: Secondary | ICD-10-CM | POA: Diagnosis not present

## 2019-02-27 DIAGNOSIS — Z79891 Long term (current) use of opiate analgesic: Secondary | ICD-10-CM | POA: Diagnosis not present

## 2019-03-01 ENCOUNTER — Other Ambulatory Visit: Payer: Self-pay

## 2019-03-01 ENCOUNTER — Encounter: Payer: Self-pay | Admitting: Obstetrics and Gynecology

## 2019-03-01 ENCOUNTER — Other Ambulatory Visit: Payer: Self-pay | Admitting: Internal Medicine

## 2019-03-01 ENCOUNTER — Ambulatory Visit (INDEPENDENT_AMBULATORY_CARE_PROVIDER_SITE_OTHER): Payer: Medicare Other | Admitting: Obstetrics and Gynecology

## 2019-03-01 VITALS — BP 120/80 | HR 84 | Temp 97.1°F | Ht 66.0 in | Wt 302.0 lb

## 2019-03-01 DIAGNOSIS — Z1231 Encounter for screening mammogram for malignant neoplasm of breast: Secondary | ICD-10-CM

## 2019-03-01 DIAGNOSIS — Z01419 Encounter for gynecological examination (general) (routine) without abnormal findings: Secondary | ICD-10-CM | POA: Diagnosis not present

## 2019-03-01 DIAGNOSIS — B372 Candidiasis of skin and nail: Secondary | ICD-10-CM

## 2019-03-01 DIAGNOSIS — L292 Pruritus vulvae: Secondary | ICD-10-CM | POA: Diagnosis not present

## 2019-03-01 DIAGNOSIS — Z6841 Body Mass Index (BMI) 40.0 and over, adult: Secondary | ICD-10-CM | POA: Diagnosis not present

## 2019-03-01 MED ORDER — NYSTATIN 100000 UNIT/GM EX CREA: 1.0000 "application " | TOPICAL_CREAM | Freq: Two times a day (BID) | CUTANEOUS | 0 refills | Status: DC

## 2019-03-01 NOTE — Patient Instructions (Addendum)
EXERCISE AND DIET:  We recommended that you start or continue a regular exercise program for good health. Regular exercise means any activity that makes your heart beat faster and makes you sweat.  We recommend exercising at least 30 minutes per day at least 3 days a week, preferably 4 or 5.  We also recommend a diet low in fat and sugar.  Inactivity, poor dietary choices and obesity can cause diabetes, heart attack, stroke, and kidney damage, among others.    ALCOHOL AND SMOKING:  Women should limit their alcohol intake to no more than 7 drinks/beers/glasses of wine (combined, not each!) per week. Moderation of alcohol intake to this level decreases your risk of breast cancer and liver damage. And of course, no recreational drugs are part of a healthy lifestyle.  And absolutely no smoking or even second hand smoke. Most people know smoking can cause heart and lung diseases, but did you know it also contributes to weakening of your bones? Aging of your skin?  Yellowing of your teeth and nails?  CALCIUM AND VITAMIN D:  Adequate intake of calcium and Vitamin D are recommended.  The recommendations for exact amounts of these supplements seem to change often, but generally speaking 1,200 mg of calcium (between diet and supplement) and 800 units of Vitamin D per day seems prudent. Certain women may benefit from higher intake of Vitamin D.  If you are among these women, your doctor will have told you during your visit.    PAP SMEARS:  Pap smears, to check for cervical cancer or precancers,  have traditionally been done yearly, although recent scientific advances have shown that most women can have pap smears less often.  However, every woman still should have a physical exam from her gynecologist every year. It will include a breast check, inspection of the vulva and vagina to check for abnormal growths or skin changes, a visual exam of the cervix, and then an exam to evaluate the size and shape of the uterus and  ovaries.  And after 57 years of age, a rectal exam is indicated to check for rectal cancers. We will also provide age appropriate advice regarding health maintenance, like when you should have certain vaccines, screening for sexually transmitted diseases, bone density testing, colonoscopy, mammograms, etc.   MAMMOGRAMS:  All women over 40 years old should have a yearly mammogram. Many facilities now offer a "3D" mammogram, which may cost around $50 extra out of pocket. If possible,  we recommend you accept the option to have the 3D mammogram performed.  It both reduces the number of women who will be called back for extra views which then turn out to be normal, and it is better than the routine mammogram at detecting truly abnormal areas.    COLON CANCER SCREENING: Now recommend starting at age 45. At this time colonoscopy is not covered for routine screening until 50. There are take home tests that can be done between 45-49.   COLONOSCOPY:  Colonoscopy to screen for colon cancer is recommended for all women at age 50.  We know, you hate the idea of the prep.  We agree, BUT, having colon cancer and not knowing it is worse!!  Colon cancer so often starts as a polyp that can be seen and removed at colonscopy, which can quite literally save your life!  And if your first colonoscopy is normal and you have no family history of colon cancer, most women don't have to have it again for   10 years.  Once every ten years, you can do something that may end up saving your life, right?  We will be happy to help you get it scheduled when you are ready.  Be sure to check your insurance coverage so you understand how much it will cost.  It may be covered as a preventative service at no cost, but you should check your particular policy.      Breast Self-Awareness Breast self-awareness means being familiar with how your breasts look and feel. It involves checking your breasts regularly and reporting any changes to your  health care provider. Practicing breast self-awareness is important. A change in your breasts can be a sign of a serious medical problem. Being familiar with how your breasts look and feel allows you to find any problems early, when treatment is more likely to be successful. All women should practice breast self-awareness, including women who have had breast implants. How to do a breast self-exam One way to learn what is normal for your breasts and whether your breasts are changing is to do a breast self-exam. To do a breast self-exam: Look for Changes  1. Remove all the clothing above your waist. 2. Stand in front of a mirror in a room with good lighting. 3. Put your hands on your hips. 4. Push your hands firmly downward. 5. Compare your breasts in the mirror. Look for differences between them (asymmetry), such as: ? Differences in shape. ? Differences in size. ? Puckers, dips, and bumps in one breast and not the other. 6. Look at each breast for changes in your skin, such as: ? Redness. ? Scaly areas. 7. Look for changes in your nipples, such as: ? Discharge. ? Bleeding. ? Dimpling. ? Redness. ? A change in position. Feel for Changes Carefully feel your breasts for lumps and changes. It is best to do this while lying on your back on the floor and again while sitting or standing in the shower or tub with soapy water on your skin. Feel each breast in the following way:  Place the arm on the side of the breast you are examining above your head.  Feel your breast with the other hand.  Start in the nipple area and make  inch (2 cm) overlapping circles to feel your breast. Use the pads of your three middle fingers to do this. Apply light pressure, then medium pressure, then firm pressure. The light pressure will allow you to feel the tissue closest to the skin. The medium pressure will allow you to feel the tissue that is a little deeper. The firm pressure will allow you to feel the tissue  close to the ribs.  Continue the overlapping circles, moving downward over the breast until you feel your ribs below your breast.  Move one finger-width toward the center of the body. Continue to use the  inch (2 cm) overlapping circles to feel your breast as you move slowly up toward your collarbone.  Continue the up and down exam using all three pressures until you reach your armpit.  Write Down What You Find  Write down what is normal for each breast and any changes that you find. Keep a written record with breast changes or normal findings for each breast. By writing this information down, you do not need to depend only on memory for size, tenderness, or location. Write down where you are in your menstrual cycle, if you are still menstruating. If you are having trouble noticing differences   in your breasts, do not get discouraged. With time you will become more familiar with the variations in your breasts and more comfortable with the exam. How often should I examine my breasts? Examine your breasts every month. If you are breastfeeding, the best time to examine your breasts is after a feeding or after using a breast pump. If you menstruate, the best time to examine your breasts is 5-7 days after your period is over. During your period, your breasts are lumpier, and it may be more difficult to notice changes. When should I see my health care provider? See your health care provider if you notice:  A change in shape or size of your breasts or nipples.  A change in the skin of your breast or nipples, such as a reddened or scaly area.  Unusual discharge from your nipples.  A lump or thick area that was not there before.  Pain in your breasts.  Anything that concerns you.  

## 2019-03-01 NOTE — Progress Notes (Signed)
58 y.o. [id number] Single Black or African American Not Hispanic or Latino female here for annual exam.  H/O hysterectomy. She c/o vulvar itching. No increase in d/c, no odor. Not sexually active.  She has RUQ abdominal pain for several years. She has elevated LFT's.   She has put herself on a diet.   She also has itching on her lower back and sides.     Multiple medical issues, including: diabetes, HTN, osteoporosis, fibromyalgia and TIA. Followed by Dr Quay Burow. Recent HgbA1C was 7.7.  No LMP recorded. Patient has had a hysterectomy.          Sexually active: No.  The current method of family planning is status post hysterectomy.    Exercising: No.  The patient does not participate in regular exercise at present. Smoker:  Yes  Health Maintenance: Pap:  1989 History of Abnormal Pap: no MMG:  2018 neg per patient Colonoscopy:  2018 normal BMD:   none TDaP:  2019 Gardasil: N/A   reports that she has been smoking cigarettes. She has a 8.75 pack-year smoking history. She has never used smokeless tobacco. She reports that she does not drink alcohol or use drugs. She has 3-4 drinks a weeks. She is on disability. She lives by herself. She has 3 kids, has 12 grand children. Estranged from her oldest child (son), doesn't know if he has any kids.   Past Medical History:  Diagnosis Date  . Anxiety   . Arthritis   . CHF (congestive heart failure) ([place])   . COPD (chronic obstructive pulmonary disease) ([place])   . Coronary artery disease   . Depression   . Diabetes mellitus without complication ([place])    type 2   . GERD (gastroesophageal reflux disease)   . Hypertension   . Lupus ([place])   . Migraines   . Neuromuscular disorder ([place])   . Osteoporosis   . Oxygen deficiency    pt uses 2.5L 02 at night   . Peripheral neuropathy   . Shingles   . Sleep apnea    had sleep study done recently ; unaware if she will be getting  a CPAP device ; patient states "im pretty sure i have it , i fall alseep  all the time "  . Stroke [facility]) 10/2015    Past Surgical History:  Procedure Laterality Date  . ABDOMINAL HYSTERECTOMY     ovaries left  . APPENDECTOMY    . CHOLECYSTECTOMY    . IR GENERIC HISTORICAL  11/07/2015   IR ANGIO INTRA EXTRACRAN SEL COM CAROTID INNOMINATE BILAT MOD SED 11/07/2015 Luanne Bras, MD [facility]  . IR GENERIC HISTORICAL  11/07/2015   IR ANGIO VERTEBRAL SEL SUBCLAVIAN INNOMINATE UNI R MOD SED 11/07/2015 Luanne Bras, MD [facility]  . IR GENERIC HISTORICAL  11/07/2015   IR ANGIO VERTEBRAL SEL VERTEBRAL UNI L MOD SED 11/07/2015 Luanne Bras, MD [facility]  . IR GENERIC HISTORICAL  11/07/2015   IR ANGIOGRAM EXTREMITY LEFT 11/07/2015 Luanne Bras, MD [facility]  . TONSILLECTOMY    . TOTAL KNEE ARTHROPLASTY Left 03/12/2017   Procedure: LEFT TOTAL KNEE ARTHROPLASTY;  Surgeon: Mcarthur Rossetti, MD;  Location: [facility] ORS;  Service: Orthopedics;  Laterality: Left;  Adductor Block    Current Outpatient Medications  Medication Sig Dispense Refill  . amitriptyline (ELAVIL) 75 MG tablet Take 1 tablet (75 mg total) by mouth at bedtime. 30 tablet 5  . aspirin EC 81 MG tablet Take 1 tablet (81 mg total) by  mouth daily.    Marland Kitchen atorvastatin (LIPITOR) 20 MG tablet Hold until instructed to restart by your primary doctor    . budesonide-formoterol (SYMBICORT) 80-4.5 MCG/ACT inhaler Inhale 2 puffs into the lungs daily as needed (SOB). 10.2 g 2  . cloNIDine (CATAPRES) 0.3 MG tablet Take 1 tablet (0.3 mg total) by mouth daily. 30 tablet 11  . diclofenac sodium (VOLTAREN) 1 % GEL Apply 2 g topically 4 (four) times daily as needed. 100 g 0  . fluticasone (FLONASE) 50 MCG/ACT nasal spray Shake liquid & use 1 spray into each nostril every day as needed for allergies 16 g 11  . furosemide (LASIX) 80 MG tablet Take 1 tablet (80 mg total) by mouth daily. 30 tablet 11  . gabapentin (NEURONTIN) 600 MG tablet Take 2 tablets by mouth 3 times a day 180 tablet 11  .  glipiZIDE (GLUCOTROL) 5 MG tablet Take 1 tablet by mouth twice daily 60 tablet 5  . glucose blood test strip Use as instructed to check sugar three times a day 200 each 5  . ipratropium-albuterol (DUONEB) 0.5-2.5 (3) MG/3ML SOLN Take 3 mLs by nebulization every 4 (four) hours as needed. (Patient taking differently: Take 3 mLs by nebulization every 4 (four) hours as needed (SOB). ) 120 mL 0  . Lancets MISC Use as directed twice per day 200 each 5  . losartan (COZAAR) 50 MG tablet Take 1 tablet by mouth twice daily 60 tablet 11  . metoprolol (TOPROL-XL) 200 MG 24 hr tablet Take 1 tablet by mouth twice daily 60 tablet 11  . nitroGLYCERIN (NITROSTAT) 0.4 MG SL tablet Place 0.4 mg under the tongue every 5 (five) minutes as needed for chest pain.    Marland Kitchen omeprazole (PRILOSEC) 40 MG capsule Take 1 capsule by mouth every day 30 capsule 11  . oxyCODONE-acetaminophen (PERCOCET) 10-325 MG tablet Take 1 tablet by mouth every 6 (six) hours as needed for pain.   0  . potassium chloride SA (K-DUR,KLOR-CON) 20 MEQ tablet Take 1 tablet by mouth every day 30 tablet 11  . Semaglutide, 1 MG/DOSE, (OZEMPIC, 1 MG/DOSE,) 2 MG/1.5ML SOPN Inject 1 mg into the skin once a week. 1 pen 5  . sitaGLIPtin (JANUVIA) 100 MG tablet Take 1 tablet (100 mg total) by mouth daily. 30 tablet 5  . tiZANidine (ZANAFLEX) 4 MG tablet Take 1 tablet (4 mg total) by mouth at bedtime. 90 tablet 1   No current facility-administered medications for this visit.     Family History  Problem Relation Age of Onset  . Hyperlipidemia Mother   . Hypertension Mother   . Stroke Mother   . Thyroid disease Mother   . Heart attack Mother   . Hyperlipidemia Father   . Hypertension Father   . Stroke Father   . Heart attack Father   . Hypertension Sister   . Cancer Sister        breast cancer  . Stroke Sister   . Thyroid disease Sister   . Crohn's disease Sister   . Hypertension Sister   . Hypertension Brother   . Diabetes Brother   . Hypertension  Brother     Review of Systems  Constitutional: Negative.   HENT: Negative.   Eyes: Negative.   Respiratory: Negative.   Cardiovascular: Negative.   Gastrointestinal: Negative.   Endocrine: Negative.   Genitourinary: Negative.   Musculoskeletal: Negative.   Skin: Positive for rash.  Allergic/Immunologic: Negative.   Neurological: Negative.   Hematological: Negative.  Psychiatric/Behavioral: Negative.     Exam:   BP 120/80 (BP Location: Right Arm, Patient Position: Sitting, Cuff Size: Large)   Pulse 84   Temp (!) 97.1 F (36.2 C) (Skin)   Ht 5\' 6"  (1.676 m)   Wt (!) 302 lb (137 kg)   BMI 48.74 kg/m   Weight change: @WEIGHTCHANGE @ Height:   Height: 5\' 6"  (167.6 cm)  Ht Readings from Last 3 Encounters:  03/01/19 5\' 6"  (1.676 m)  02/13/19 5\' 6"  (1.676 m)  01/16/19 5' 5.5" (1.664 m)    General appearance: alert, cooperative and appears stated age Head: Normocephalic, without obvious abnormality, atraumatic Neck: no adenopathy, supple, symmetrical, trachea midline and thyroid normal to inspection and palpation Lungs: clear to auscultation bilaterally Cardiovascular: regular rate and rhythm Breasts: normal appearance, no masses or tenderness Abdomen: soft, non-tender; non distended,  no masses,  no organomegaly Extremities: extremities normal, atraumatic, no cyanosis or edema Skin: Skin color, texture, turgor normal. She has a small circular erythematous rash on her left upper thigh. She has some irritation in bilateral groin concerning for candida intertrigo Lymph nodes: Cervical, supraclavicular, and axillary nodes normal. No abnormal inguinal nodes palpated Neurologic: Grossly normal   Pelvic: External genitalia:  no lesions              Urethra:  normal appearing urethra with no masses, tenderness or lesions              Bartholins and Skenes: normal                 Vagina: mildly atrophic appearing vagina with normal color, slight increase in thick, white vaginal  d/c              Cervix: absent               Bimanual Exam:  Uterus:  uterus absent              Adnexa: no mass, fullness, tenderness               Rectovaginal: Confirms               Anus:  normal sphincter tone, no lesions  Chaperone was present for exam.  A:  Well Woman with normal exam  Multiple medical issues, followed by Dr Quay Burow  BMI 48  Candida intertrigo  Genital pruritus  H/O hysterectomy  P:   No pap needed  Discussed breast self exam  Discussed calcium and vit D intake   Mammogram overdue, # given  Colonoscopy UTD  Labs UTD  Affirm sent   Treat with nystatin

## 2019-03-02 LAB — VAGINITIS/VAGINOSIS, DNA PROBE
Candida Species: NEGATIVE
Gardnerella vaginalis: NEGATIVE
Trichomonas vaginosis: NEGATIVE

## 2019-03-10 ENCOUNTER — Other Ambulatory Visit: Payer: Self-pay | Admitting: Internal Medicine

## 2019-03-13 ENCOUNTER — Other Ambulatory Visit: Payer: Self-pay | Admitting: Internal Medicine

## 2019-03-14 ENCOUNTER — Other Ambulatory Visit: Payer: Self-pay

## 2019-03-14 MED ORDER — GLIPIZIDE 5 MG PO TABS: 5.0000 mg | ORAL_TABLET | Freq: Two times a day (BID) | ORAL | 5 refills | Status: DC

## 2019-03-27 DIAGNOSIS — M25559 Pain in unspecified hip: Secondary | ICD-10-CM | POA: Diagnosis not present

## 2019-03-27 DIAGNOSIS — M25562 Pain in left knee: Secondary | ICD-10-CM | POA: Diagnosis not present

## 2019-03-27 DIAGNOSIS — M545 Low back pain: Secondary | ICD-10-CM | POA: Diagnosis not present

## 2019-03-27 DIAGNOSIS — G894 Chronic pain syndrome: Secondary | ICD-10-CM | POA: Diagnosis not present

## 2019-03-27 DIAGNOSIS — Z79891 Long term (current) use of opiate analgesic: Secondary | ICD-10-CM | POA: Diagnosis not present

## 2019-04-12 ENCOUNTER — Other Ambulatory Visit: Payer: Self-pay | Admitting: Internal Medicine

## 2019-04-25 ENCOUNTER — Other Ambulatory Visit: Payer: Self-pay | Admitting: Internal Medicine

## 2019-04-25 DIAGNOSIS — I10 Essential (primary) hypertension: Secondary | ICD-10-CM

## 2019-04-26 ENCOUNTER — Ambulatory Visit
Admission: RE | Admit: 2019-04-26 | Discharge: 2019-04-26 | Disposition: A | Discharge status: Home / Self Care | Payer: Medicare Other | Source: Ambulatory Visit | Attending: Internal Medicine | Admitting: Internal Medicine

## 2019-04-26 ENCOUNTER — Ambulatory Visit (INDEPENDENT_AMBULATORY_CARE_PROVIDER_SITE_OTHER): Payer: Medicare Other | Admitting: Internal Medicine

## 2019-04-26 ENCOUNTER — Encounter: Payer: Self-pay | Admitting: Internal Medicine

## 2019-04-26 ENCOUNTER — Other Ambulatory Visit: Payer: Self-pay

## 2019-04-26 VITALS — BP 122/78 | HR 87 | Temp 98.6°F | Resp 16 | Ht 66.0 in | Wt 303.0 lb

## 2019-04-26 DIAGNOSIS — J029 Acute pharyngitis, unspecified: Secondary | ICD-10-CM | POA: Diagnosis not present

## 2019-04-26 DIAGNOSIS — E119 Type 2 diabetes mellitus without complications: Secondary | ICD-10-CM

## 2019-04-26 DIAGNOSIS — M26621 Arthralgia of right temporomandibular joint: Secondary | ICD-10-CM | POA: Diagnosis not present

## 2019-04-26 DIAGNOSIS — Z1231 Encounter for screening mammogram for malignant neoplasm of breast: Secondary | ICD-10-CM | POA: Diagnosis not present

## 2019-04-26 DIAGNOSIS — M26629 Arthralgia of temporomandibular joint, unspecified side: Secondary | ICD-10-CM | POA: Insufficient documentation

## 2019-04-26 MED ORDER — GLIPIZIDE 5 MG PO TABS: 5.0000 mg | ORAL_TABLET | Freq: Every day | ORAL | 5 refills | Status: DC

## 2019-04-26 NOTE — Progress Notes (Signed)
Subjective:    Patient ID: Tammy Strickland, female    DOB: 10-May-1960, 59 y.o.   MRN: [id number]  HPI The patient is here for an acute visit for right side sore throat, right ears pops when she swallows.   Her symptoms started last Wednesday, one week ago.  After she brushes her teeth and gargles with biotene.  She swallows and she feels something on the right side of her throat.  When she swallows she feels it to.  It feels like something is stuck there.  She denies any difficulty swallowing food or liquid.  She is also noticed that when she chews food she hears a popping in her right ear.  She states it could be clicking.  It only does it when she chews.  She has pain only when the ear pops.    Her body is retaining fluid.  She drinks a lot of water.  She was going to the bathroom frequently last month - starting the end of the month she has been urinating less.  Her legs have been swelling the past few weeks as soon as she gets up in the morning.  She has started to gain weight. She denies changes in her medications or food, but states her sugars dropped twice in the past month-it was 48 one-time and 69 the other.  Both times she had gone on longer time without eating.  When the sugar dropped she gets sweaty and lightheaded.  She has been eating 2 cakes if her sugar drops.      Medications and allergies reviewed with patient and updated if appropriate.  Patient Active Problem List   Diagnosis Date Noted  . Unilateral primary osteoarthritis, right knee 01/16/2019  . Vaginal yeast infection 11/01/2018  . Anxiety 11/01/2018  . RUQ pain 06/22/2018  . Fatty liver 06/19/2018  . Lactic acid acidosis 06/15/2018  . Transaminitis 06/15/2018  . Chronic bilateral low back pain without sciatica 05/17/2018  . Body mass index 50.0-59.9, adult ([place]) 05/17/2018  . It band syndrome, left 12/29/2017  . Trochanteric bursitis, left hip 12/29/2017  . Dysphagia 04/29/2017  . History of left knee  replacement 04/21/2017  . Pain in left ankle and joints of left foot 04/21/2017  . Pain in left hand 04/21/2017  . OSA (obstructive sleep apnea) 02/23/2017  . Acute gouty arthritis 12/25/2016  . Stroke ([place])   . Chronic fatigue 11/09/2016  . Hypoxia 11/09/2016  . Morbidly obese ([place]) 11/09/2016  . Rash and nonspecific skin eruption 07/21/2016  . Frequent headaches 07/21/2016  . Allergic rhinitis 07/16/2016  . Vitamin D deficiency 04/16/2016  . Hyperlipidemia 01/15/2016  . Diabetes ([place]) 11/26/2015  . Chronic diastolic congestive heart failure ([place]) 11/06/2015  . TIA (transient ischemic attack)   . Carotid-cavernous fistula   . Insomnia 10/10/2015  . Depression 10/10/2015  . Benign essential HTN 08/15/2015  . Fibromyalgia 08/15/2015  . Peripheral neuropathy 08/15/2015  . CAD (coronary artery disease) 08/15/2015  . COPD (chronic obstructive pulmonary disease) ([place]) 08/15/2015  . Osteoporosis 08/15/2015  . 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 DDD (degenerative disc disease), lumbar 08/15/2015  . Occupational exposure to industrial toxins 08/15/2015  . GERD (gastroesophageal reflux disease) 08/15/2015    Current Outpatient Medications on File Prior to Visit  Medication Sig Dispense Refill  . amitriptyline (ELAVIL) 75 MG tablet Take 1 tablet (75 mg total) by mouth at bedtime. 30 tablet 5  . aspirin EC 81 MG tablet Take 1 tablet (81 mg total)  by mouth daily.    Marland Kitchen atorvastatin (LIPITOR) 20 MG tablet Take 1 tablet by mouth every day 30 tablet 3  . cloNIDine (CATAPRES) 0.3 MG tablet Take 1 tablet by mouth every day 30 tablet 6  . diclofenac sodium (VOLTAREN) 1 % GEL Apply 2 g topically 4 (four) times daily as needed. 100 g 0  . fluticasone (FLONASE) 50 MCG/ACT nasal spray Shake liquid & use 1 spray into each nostril every day as needed for allergies 16 g 11  . furosemide (LASIX) 80 MG tablet Take 1 tablet by mouth every day 30 tablet 6  . gabapentin (NEURONTIN) 600 MG tablet Take 2 tablets by  mouth 3 times a day 180 tablet 11  . glipiZIDE (GLUCOTROL) 5 MG tablet Take 1 tablet (5 mg total) by mouth 2 (two) times daily. 60 tablet 5  . glucose blood test strip Use as instructed to check sugar three times a day 200 each 5  . ipratropium-albuterol (DUONEB) 0.5-2.5 (3) MG/3ML SOLN Take 3 mLs by nebulization every 4 (four) hours as needed. (Patient taking differently: Take 3 mLs by nebulization every 4 (four) hours as needed (SOB). ) 120 mL 0  . Lancets MISC Use as directed twice per day 200 each 5  . losartan (COZAAR) 50 MG tablet Take 1 tablet by mouth twice daily 60 tablet 6  . metoprolol (TOPROL-XL) 200 MG 24 hr tablet Take 1 tablet by mouth twice daily 60 tablet 6  . nitroGLYCERIN (NITROSTAT) 0.4 MG SL tablet Place 0.4 mg under the tongue every 5 (five) minutes as needed for chest pain.    Marland Kitchen nystatin cream (MYCOSTATIN) Apply 1 application topically 2 (two) times daily. Apply to affected area BID for up to 7 days. 30 g 0  . omeprazole (PRILOSEC) 40 MG capsule Take 1 capsule by mouth every day 30 capsule 11  . oxyCODONE-acetaminophen (PERCOCET) 10-325 MG tablet Take 1 tablet by mouth every 6 (six) hours as needed for pain.   0  . OZEMPIC, 1 MG/DOSE, 2 MG/1.5ML SOPN Inject 1 mg subcutaneously once a week. 3 mL 11  . potassium chloride SA (KLOR-CON) 20 MEQ tablet Take 1 tablet by mouth every day 30 tablet 6  . sitaGLIPtin (JANUVIA) 100 MG tablet Take 1 tablet (100 mg total) by mouth daily. 30 tablet 5  . SYMBICORT 80-4.5 MCG/ACT inhaler Inhale 2 puffs into the lungs daily as needed 1 Inhaler 11  . tiZANidine (ZANAFLEX) 4 MG tablet Take 1 tablet (4 mg total) by mouth at bedtime. 90 tablet 1  . traZODone (DESYREL) 50 MG tablet Take 1 tablet by mouth at bedtime as needed for sleep 30 tablet 0   No current facility-administered medications on file prior to visit.    Past Medical History:  Diagnosis Date  . Anxiety   . Arthritis   . CHF (congestive heart failure) ([place])   . COPD (chronic  obstructive pulmonary disease) ([place])   . Coronary artery disease   . Depression   . Diabetes mellitus without complication ([place])    type 2   . GERD (gastroesophageal reflux disease)   . Hypertension   . Lupus ([place])   . Migraines   . Neuromuscular disorder ([place])   . Osteoporosis   . Oxygen deficiency    pt uses 2.5L 02 at night   . Peripheral neuropathy   . Shingles   . Sleep apnea    had sleep study done recently ; unaware if she will be getting  a  CPAP device ; patient states "im pretty sure i have it , i fall alseep all the time "  . Stroke [facility]) 10/2015    Past Surgical History:  Procedure Laterality Date  . ABDOMINAL HYSTERECTOMY     ovaries left  . APPENDECTOMY    . CHOLECYSTECTOMY    . IR GENERIC HISTORICAL  11/07/2015   IR ANGIO INTRA EXTRACRAN SEL COM CAROTID INNOMINATE BILAT MOD SED 11/07/2015 Luanne Bras, MD [facility]  . IR GENERIC HISTORICAL  11/07/2015   IR ANGIO VERTEBRAL SEL SUBCLAVIAN INNOMINATE UNI R MOD SED 11/07/2015 Luanne Bras, MD [facility]  . IR GENERIC HISTORICAL  11/07/2015   IR ANGIO VERTEBRAL SEL VERTEBRAL UNI L MOD SED 11/07/2015 Luanne Bras, MD [facility]  . IR GENERIC HISTORICAL  11/07/2015   IR ANGIOGRAM EXTREMITY LEFT 11/07/2015 Luanne Bras, MD [facility]  . TONSILLECTOMY    . TOTAL KNEE ARTHROPLASTY Left 03/12/2017   Procedure: LEFT TOTAL KNEE ARTHROPLASTY;  Surgeon: Mcarthur Rossetti, MD;  Location: [facility];  Service: Orthopedics;  Laterality: Left;  Adductor Block    Social History   Socioeconomic History  . Marital status: Single    Spouse name: n/a  . Number of children: 3  . Years of education: 12+  . Highest education level: Not on file  Occupational History  . 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 Smoking status: Current Every Day Smoker    Packs/day: 0.25    Years: 35.00    Pack years: 8.75     Types: Cigarettes  . Smokeless tobacco: Never Used  . Tobacco comment: referred  to smoking  cessation  classes. at  [facility]   Substance and Sexual Activity  . Alcohol use: No  . Drug use: No  . Sexual activity: Not Currently    Partners: Female    Birth control/protection: Surgical    Comment: hysterectomy  Other Topics Concern  . Not on file  Social History Narrative   Moved to [place] from [place], [place] February 2017, to help her daughter.   Lives with her daughter.   Sons live in [place] and [place].   She reports that there were originally 17 children in her family (she is the youngest), the oldest are deceased, some prior to her birth, and she isn't sure which were female/female or how they died.   Social Determinants of Health   Financial Resource Strain:   . Difficulty of Paying Living Expenses: Not on file  Food Insecurity:   . Worried About Charity fundraiser in the Last Year: Not on file  . Ran Out of Food in the Last Year: Not on file  Transportation Needs:   . Lack of Transportation (Medical): Not on file  . Lack of Transportation (Non-Medical): Not on file  Physical Activity:   . Days of Exercise per Week: Not on file  . Minutes of Exercise per Session: Not on file  Stress:   . Feeling of Stress : Not on file  Social Connections:   . Frequency of Communication with Friends and Family: Not on file  . Frequency of Social Gatherings with Friends and Family: Not on file  . Attends Religious Services: Not on file  . Active Member of Clubs or Organizations: Not on file  . Attends Archivist Meetings: Not on file  . Marital Status: Not on file    Family History  Problem  Relation Age of Onset  . Hyperlipidemia Mother   . Hypertension Mother   . Stroke Mother   . Thyroid disease Mother   . Heart attack Mother   . Hyperlipidemia Father   . Hypertension Father   . Stroke Father   . Heart attack Father   . Hypertension Sister   . Cancer Sister         breast cancer  . Stroke Sister   . Thyroid disease Sister   . Crohn's disease Sister   . Hypertension Sister   . Hypertension Brother   . Diabetes Brother   . Hypertension Brother     Review of Systems  Constitutional: Negative for chills and fever.  HENT: Positive for congestion (chronic), ear pain (when it pops) and sore throat (right side only). Negative for sinus pain and trouble swallowing.   Respiratory: Positive for shortness of breath (with moderate exertion - chronic). Negative for cough and wheezing.   Neurological: Positive for headaches. Negative for dizziness.       Objective:   Vitals:   04/26/19 0807  BP: 122/78  Pulse: 87  Resp: 16  Temp: 98.6 F (37 C)  SpO2: 99%   BP Readings from Last 3 Encounters:  04/26/19 122/78  03/01/19 120/80  11/01/18 (!) 154/92   Wt Readings from Last 3 Encounters:  04/26/19 (!) 303 lb (137.4 kg)  03/01/19 (!) 302 lb (137 kg)  02/13/19 290 lb (131.5 kg)   Body mass index is 48.91 kg/m.   Physical Exam    GENERAL APPEARANCE: Appears stated age, well appearing, NAD EYES: conjunctiva clear, no icterus HEENT: bilateral tympanic membranes and ear canals normal, oropharynx with no erythema, tenderness right TMJ joint with jaw movement, no thyromegaly, trachea midline, no cervical or supraclavicular lymphadenopathy LUNGS: Clear to auscultation without wheeze or crackles, unlabored breathing, good air entry bilaterally CARDIOVASCULAR: Normal S1,S2 without murmurs, no edema SKIN: Warm, dry      Assessment & Plan:    See Problem List for Assessment and Plan of chronic medical problems.    This visit occurred during the SARS-CoV-2 public health emergency.  Safety protocols were in place, including screening questions prior to the visit, additional usage of staff PPE, and extensive cleaning of exam room while observing appropriate contact time as indicated for disinfecting solutions.

## 2019-04-26 NOTE — Assessment & Plan Note (Signed)
New problem On right side of throat only No concerns on exam Will refer to ENT-if symptoms resolve she does not need to see them

## 2019-04-26 NOTE — Patient Instructions (Addendum)
Stop eating the cakes.    Medications reviewed and updated.  Changes include :   Decrease glipizide to 5 mg once a day.     Your ear pain and popping is from arthritis in your right jaw.   A referral was ordered for ENT for your sore throat.     Follow up in 1 month for routine follow up

## 2019-04-26 NOTE — Assessment & Plan Note (Addendum)
Chronic Sugars dropped in the past month in 2 locations when she went to long without eating Stressed the importance of eating on a regular basis She has been compensating by eating too small cakes, which may be causing some of the fluid retention and weight gain again Decrease glipizide to once daily Stop eating cakes Advised to call if sugars are low or if there is any questions about her sugars Follow-up in 1 month-we will check blood work at that time

## 2019-04-26 NOTE — Assessment & Plan Note (Addendum)
New Right ear pain and popping in the ear she hears when chewing is related to TMJ Discussed the dentist is typically the one that treats this Advised soft diet, avoid opening mouth wide and excessive chewing

## 2019-04-27 ENCOUNTER — Other Ambulatory Visit: Payer: Self-pay | Admitting: Internal Medicine

## 2019-04-27 DIAGNOSIS — R928 Other abnormal and inconclusive findings on diagnostic imaging of breast: Secondary | ICD-10-CM

## 2019-05-02 ENCOUNTER — Other Ambulatory Visit: Payer: Self-pay | Admitting: Internal Medicine

## 2019-05-02 ENCOUNTER — Ambulatory Visit
Admission: RE | Admit: 2019-05-02 | Discharge: 2019-05-02 | Disposition: A | Discharge status: Home / Self Care | Payer: Medicare Other | Source: Ambulatory Visit | Attending: Internal Medicine | Admitting: Internal Medicine

## 2019-05-02 ENCOUNTER — Other Ambulatory Visit: Payer: Self-pay

## 2019-05-02 DIAGNOSIS — R928 Other abnormal and inconclusive findings on diagnostic imaging of breast: Secondary | ICD-10-CM | POA: Diagnosis not present

## 2019-05-02 DIAGNOSIS — R921 Mammographic calcification found on diagnostic imaging of breast: Secondary | ICD-10-CM

## 2019-05-09 ENCOUNTER — Ambulatory Visit
Admission: RE | Admit: 2019-05-09 | Discharge: 2019-05-09 | Disposition: A | Discharge status: Home / Self Care | Payer: Medicare Other | Source: Ambulatory Visit | Attending: Internal Medicine | Admitting: Internal Medicine

## 2019-05-09 ENCOUNTER — Other Ambulatory Visit: Payer: Self-pay | Admitting: Internal Medicine

## 2019-05-09 ENCOUNTER — Other Ambulatory Visit: Payer: Self-pay

## 2019-05-09 DIAGNOSIS — R921 Mammographic calcification found on diagnostic imaging of breast: Secondary | ICD-10-CM | POA: Diagnosis not present

## 2019-05-09 DIAGNOSIS — N6325 Unspecified lump in the left breast, overlapping quadrants: Secondary | ICD-10-CM | POA: Diagnosis not present

## 2019-05-09 DIAGNOSIS — N6324 Unspecified lump in the left breast, lower inner quadrant: Secondary | ICD-10-CM | POA: Diagnosis not present

## 2019-05-09 DIAGNOSIS — N6489 Other specified disorders of breast: Secondary | ICD-10-CM | POA: Diagnosis not present

## 2019-05-10 ENCOUNTER — Other Ambulatory Visit: Payer: Self-pay

## 2019-05-10 ENCOUNTER — Ambulatory Visit (INDEPENDENT_AMBULATORY_CARE_PROVIDER_SITE_OTHER): Payer: Medicare Other | Admitting: Otolaryngology

## 2019-05-10 ENCOUNTER — Other Ambulatory Visit: Payer: Self-pay | Admitting: Internal Medicine

## 2019-05-10 VITALS — Temp 97.5°F

## 2019-05-10 DIAGNOSIS — M26609 Unspecified temporomandibular joint disorder, unspecified side: Secondary | ICD-10-CM | POA: Diagnosis not present

## 2019-05-10 DIAGNOSIS — J029 Acute pharyngitis, unspecified: Secondary | ICD-10-CM

## 2019-05-10 DIAGNOSIS — R921 Mammographic calcification found on diagnostic imaging of breast: Secondary | ICD-10-CM

## 2019-05-10 NOTE — Progress Notes (Signed)
HPI: Tammy Strickland is a 59 y.o. female who presents is referred by Lavone Orn for evaluation of right-sided throat complaints as well as popping in her right ear when she chews.  She does not notice any hearing problems in the ear.  Just the popping sound.  Her throat is feeling better today as she is not having any sore throat today.  She does take omeprazole for GERD.  She uses Flonase and saline rinses intermittently for nasal congestion. She smokes half a pack a day.  Past Medical History:  Diagnosis Date  . Anxiety   . Arthritis   . CHF (congestive heart failure) ([place])   . COPD (chronic obstructive pulmonary disease) ([place])   . Coronary artery disease   . Depression   . Diabetes mellitus without complication ([place])    type 2   . GERD (gastroesophageal reflux disease)   . Hypertension   . Lupus ([place])   . Migraines   . Neuromuscular disorder ([place])   . Osteoporosis   . Oxygen deficiency    pt uses 2.5L 02 at night   . Peripheral neuropathy   . Shingles   . Sleep apnea    had sleep study done recently ; unaware if she will be getting  a CPAP device ; patient states "im pretty sure i have it , i fall alseep all the time "  . Stroke [facility]) 10/2015   Past Surgical History:  Procedure Laterality Date  . ABDOMINAL HYSTERECTOMY     ovaries left  . APPENDECTOMY    . CHOLECYSTECTOMY    . IR GENERIC HISTORICAL  11/07/2015   IR ANGIO INTRA EXTRACRAN SEL COM CAROTID INNOMINATE BILAT MOD SED 11/07/2015 Luanne Bras, MD [facility]  . IR GENERIC HISTORICAL  11/07/2015   IR ANGIO VERTEBRAL SEL SUBCLAVIAN INNOMINATE UNI R MOD SED 11/07/2015 Luanne Bras, MD [facility]  . IR GENERIC HISTORICAL  11/07/2015   IR ANGIO VERTEBRAL SEL VERTEBRAL UNI L MOD SED 11/07/2015 Luanne Bras, MD [facility]  . IR GENERIC HISTORICAL  11/07/2015   IR ANGIOGRAM EXTREMITY LEFT 11/07/2015 Luanne Bras, MD [facility]  . TONSILLECTOMY    . TOTAL KNEE ARTHROPLASTY Left 03/12/2017    Procedure: LEFT TOTAL KNEE ARTHROPLASTY;  Surgeon: Mcarthur Rossetti, MD;  Location: [facility];  Service: Orthopedics;  Laterality: Left;  Adductor Block   Social History   Socioeconomic History  . Marital status: Single    Spouse name: n/a  . Number of children: 3  . Years of education: 12+  . Highest education level: Not on file  Occupational History  . 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 Smoking status: Current Every Day Smoker    Packs/day: 0.25    Years: 35.00    Pack years: 8.75    Types: Cigarettes  . Smokeless tobacco: Never Used  . Tobacco comment: referred  to smoking  cessation  classes. at  [facility]   Substance and Sexual Activity  . Alcohol use: No  . Drug use: No  . Sexual activity: Not Currently    Partners: Female    Birth control/protection: Surgical    Comment: hysterectomy  Other Topics Concern  . Not on file  Social History Narrative   Moved to [place] from [place], [place] February 2017, to help her daughter.   Lives with her daughter.   Sons live in [place] and [place].   She reports that  there were originally 17 children in her family (she is the youngest), the oldest are deceased, some prior to her birth, and she isn't sure which were female/female or how they died.   Social Determinants of Health   Financial Resource Strain:   . Difficulty of Paying Living Expenses: Not on file  Food Insecurity:   . Worried About Charity fundraiser in the Last Year: Not on file  . Ran Out of Food in the Last Year: Not on file  Transportation Needs:   . Lack of Transportation (Medical): Not on file  . Lack of Transportation (Non-Medical): Not on file  Physical Activity:   . Days of Exercise per Week: Not on file  . Minutes of Exercise per Session: Not on file  Stress:   . Feeling of Stress : Not on file  Social Connections:   . Frequency of Communication with  Friends and Family: Not on file  . Frequency of Social Gatherings with Friends and Family: Not on file  . Attends Religious Services: Not on file  . Active Member of Clubs or Organizations: Not on file  . Attends Archivist Meetings: Not on file  . Marital Status: Not on file   Family History  Problem Relation Age of Onset  . Hyperlipidemia Mother   . Hypertension Mother   . Stroke Mother   . Thyroid disease Mother   . Heart attack Mother   . Hyperlipidemia Father   . Hypertension Father   . Stroke Father   . Heart attack Father   . Hypertension Sister   . Cancer Sister        breast cancer  . Stroke Sister   . Thyroid disease Sister   . Crohn's disease Sister   . Hypertension Sister   . Hypertension Brother   . Diabetes Brother   . Hypertension Brother    Allergies  Allergen Reactions  . Metformin And Related     Lactic acidosis  . Bacitracin Hives   Prior to Admission medications   Medication Sig Start Date End Date Taking? Authorizing Provider  amitriptyline (ELAVIL) 75 MG tablet Take 1 tablet (75 mg total) by mouth at bedtime. 02/03/19   Binnie Rail, MD  aspirin EC 81 MG tablet Take 1 tablet (81 mg total) by mouth daily. 11/01/15   Elgergawy, Silver Huguenin, MD  atorvastatin (LIPITOR) 20 MG tablet Take 1 tablet by mouth every day 03/13/19   Binnie Rail, MD  cloNIDine (CATAPRES) 0.3 MG tablet Take 1 tablet by mouth every day 04/25/19   Binnie Rail, MD  diclofenac sodium (VOLTAREN) 1 % GEL Apply 2 g topically 4 (four) times daily as needed. 06/17/18   Mikhail, Velta Addison, DO  fluticasone (FLONASE) 50 MCG/ACT nasal spray Shake liquid & use 1 spray into each nostril every day as needed for allergies 02/23/19   Binnie Rail, MD  furosemide (LASIX) 80 MG tablet Take 1 tablet by mouth every day 04/25/19   Binnie Rail, MD  gabapentin (NEURONTIN) 600 MG tablet Take 2 tablets by mouth 3 times a day 02/23/19   Binnie Rail, MD  glipiZIDE (GLUCOTROL) 5 MG tablet Take  1 tablet (5 mg total) by mouth daily before breakfast. 04/26/19   Burns, Claudina Lick, MD  glucose blood test strip Use as instructed to check sugar three times a day 01/28/17   Binnie Rail, MD  ipratropium-albuterol (DUONEB) 0.5-2.5 (3) MG/3ML SOLN Take 3 mLs by nebulization every  4 (four) hours as needed. Patient taking differently: Take 3 mLs by nebulization every 4 (four) hours as needed (SOB).  05/26/18   Raylene Everts, MD  Lancets MISC Use as directed twice per day 01/28/17   Binnie Rail, MD  losartan (COZAAR) 50 MG tablet Take 1 tablet by mouth twice daily 04/25/19   Binnie Rail, MD  metoprolol (TOPROL-XL) 200 MG 24 hr tablet Take 1 tablet by mouth twice daily 04/25/19   Binnie Rail, MD  nitroGLYCERIN (NITROSTAT) 0.4 MG SL tablet Place 0.4 mg under the tongue every 5 (five) minutes as needed for chest pain.    [provider]  nystatin cream (MYCOSTATIN) Apply 1 application topically 2 (two) times daily. Apply to affected area BID for up to 7 days. 03/01/19   Salvadore Dom, MD  omeprazole (PRILOSEC) 40 MG capsule Take 1 capsule by mouth every day 02/23/19   Binnie Rail, MD  oxyCODONE-acetaminophen (PERCOCET) 10-325 MG tablet Take 1 tablet by mouth every 6 (six) hours as needed for pain.  01/31/18   [provider]  OZEMPIC, 1 MG/DOSE, 2 MG/1.5ML SOPN Inject 1 mg subcutaneously once a week. 03/13/19   Binnie Rail, MD  potassium chloride SA (KLOR-CON) 20 MEQ tablet Take 1 tablet by mouth every day 04/25/19   Binnie Rail, MD  sitaGLIPtin (JANUVIA) 100 MG tablet Take 1 tablet (100 mg total) by mouth daily. 08/22/18   Binnie Rail, MD  SYMBICORT 80-4.5 MCG/ACT inhaler Inhale 2 puffs into the lungs daily as needed 03/13/19   Binnie Rail, MD  tiZANidine (ZANAFLEX) 4 MG tablet Take 1 tablet (4 mg total) by mouth at bedtime. 02/14/19   Binnie Rail, MD  traZODone (DESYREL) 50 MG tablet Take 1 tablet by mouth at bedtime as needed for sleep 04/12/19   Janith Lima, MD     Positive ROS: Otherwise negative  All other systems have been reviewed and were otherwise negative with the exception of those mentioned in the HPI and as above.  Physical Exam: Constitutional: Alert, well-appearing, no acute distress.  She is having no sore throat today and has no trouble swallowing. Ears: External ears without lesions or tenderness. Ear canals are clear bilaterally with intact, clear TMs.  Hearing screening with a 512 1024 tuning fork revealed good hearing in both ears which was symmetric.  AC > BC bilaterally.  Normal TM exam bilaterally. Nasal: External nose without lesions. Septum with mild deformity.  Moderate rhinitis.  Moderate mucosal edema with clear mucus discharge.  No signs of infection.. Clear nasal passages Oral: Lips and gums without lesions. Tongue and palate mucosa without lesions. Posterior oropharynx clear.  Patient is status post tonsillectomy with normal-appearing oral mucosa and normal-appearing tonsil regions. Fiberoptic laryngoscopy through the right nostril revealed moderate rhinitis with clear mucus discharge.  Eustachian tube area was unobstructed with a clear nasopharynx.  Base of tongue vallecula and epiglottis were normal.  Vocal cords were clear bilaterally with normal vocal cord mobility.  Piriform sinuses were clear bilaterally.. Neck: No palpable adenopathy or masses.  No palpable nodes within the neck. Respiratory: Breathing comfortably  Skin: No facial/neck lesions or rash noted.  Laryngoscopy  Date/Time: 05/10/2019 8:43 AM Performed by: Rozetta Nunnery, MD Authorized by: Rozetta Nunnery, MD   Consent:    Consent obtained:  Verbal   Consent given by:  Patient   Risks discussed:  Pain Procedure details:    Indications: direct visualization  of the upper aerodigestive tract     Medication:  Afrin   Instrument: flexible fiberoptic laryngoscope     Scope location: right nare   Mouth:    Posterior pharyngeal  wall: normal     Vallecula: normal     Base of tongue: normal     Epiglottis: normal   Throat:    Pyriform sinus: normal     False vocal cords: normal     True vocal cords: normal   Comments:     On fiberoptic laryngoscopy patient had mild rhinitis with clear mucus discharge.  Upper airway was otherwise normal to examination with no significant pathology noted.    Assessment: Right-sided sore throat.  Normal upper airway examination otherwise.  No signs of infection or neoplasia. Right ear popping probably related to TMJ as this occurs mostly when she chews. Mild rhinitis  Plan: Reviewed with the patient concerning normal upper airway examination.  Briefly discussed TMJ treatment options including soft diet and NSAIDs as needed pain or discomfort. Suggested using Flonase or Nasacort 2 sprays at night as needed nasal congestion.   Radene Journey, MD   CC:

## 2019-05-11 ENCOUNTER — Telehealth: Payer: Self-pay

## 2019-05-11 NOTE — Telephone Encounter (Signed)
New message     1. Which medications need to be refilled? (please list name of each medication and dose if known) traZODone (DESYREL) 50 MG tablet  2. Which pharmacy/location (including street and city if local pharmacy) is medication to be sent to?  [place]   3. Do they need a 30 day or 90 day supply? 30 days supply

## 2019-05-12 MED ORDER — TRAZODONE HCL 50 MG PO TABS: 50.0000 mg | ORAL_TABLET | Freq: Every evening | ORAL | 1 refills | Status: DC | PRN

## 2019-05-12 NOTE — Telephone Encounter (Signed)
Rx sent 

## 2019-05-18 ENCOUNTER — Ambulatory Visit
Admission: RE | Admit: 2019-05-18 | Discharge: 2019-05-18 | Disposition: A | Discharge status: Home / Self Care | Payer: Medicare Other | Source: Ambulatory Visit | Attending: Internal Medicine | Admitting: Internal Medicine

## 2019-05-18 ENCOUNTER — Other Ambulatory Visit: Payer: Self-pay

## 2019-05-18 DIAGNOSIS — N6322 Unspecified lump in the left breast, upper inner quadrant: Secondary | ICD-10-CM | POA: Diagnosis not present

## 2019-05-18 DIAGNOSIS — R921 Mammographic calcification found on diagnostic imaging of breast: Secondary | ICD-10-CM | POA: Diagnosis not present

## 2019-05-19 ENCOUNTER — Telehealth: Payer: Self-pay

## 2019-05-19 NOTE — Telephone Encounter (Signed)
I have prescribed her atorvastatin 20 mg daily.  This prescription was sent in November.  She has a follow-up and we can ask her if she is taking it or not.  They can also call her and tell her that she should be taking it.

## 2019-05-19 NOTE — Telephone Encounter (Signed)
Butch Penny with [employer] and states that she would like a call back from [place]. States that the patient has CVD and that per [facility] cholesterol guidelines- patient should be on a moderate to high intensity statin. Would like a call back to discuss this. CB#: [phone]

## 2019-05-23 NOTE — Progress Notes (Signed)
Subjective:    Patient ID: Tammy Strickland, female    DOB: 1961/03/05, 59 y.o.   MRN: [id number]  HPI The patient is here for follow up of their chronic medical problems, including diabetes, hyperlipidemia, hypertension, morbid obesity, insomnia,  GERD   We stopped her glipizide one month ago due to low sugars after not eating for hours.  She denies low sugars.  She is trying to eat healthy.  She has not lost any weight.  She is doing a little exercise - rides her bike.    Left breast - where she had the biopsies - still sore, not healing.  She denies any discharge.  She denies any fevers or chills.    Medications and allergies reviewed with patient and updated if appropriate.  Patient Active Problem List   Diagnosis Date Noted  . TMJ arthralgia 04/26/2019  . Sore throat 04/26/2019  . Unilateral primary osteoarthritis, right knee 01/16/2019  . Vaginal yeast infection 11/01/2018  . Anxiety 11/01/2018  . RUQ pain 06/22/2018  . Fatty liver 06/19/2018  . Lactic acid acidosis 06/15/2018  . Transaminitis 06/15/2018  . Chronic bilateral low back pain without sciatica 05/17/2018  . Body mass index 50.0-59.9, adult ([place]) 05/17/2018  . It band syndrome, left 12/29/2017  . Trochanteric bursitis, left hip 12/29/2017  . Dysphagia 04/29/2017  . History of left knee replacement 04/21/2017  . Pain in left ankle and joints of left foot 04/21/2017  . Pain in left hand 04/21/2017  . OSA (obstructive sleep apnea) 02/23/2017  . Acute gouty arthritis 12/25/2016  . Stroke ([place])   . Chronic fatigue 11/09/2016  . Hypoxia 11/09/2016  . Morbidly obese ([place]) 11/09/2016  . Rash and nonspecific skin eruption 07/21/2016  . Frequent headaches 07/21/2016  . Allergic rhinitis 07/16/2016  . Vitamin D deficiency 04/16/2016  . Hyperlipidemia 01/15/2016  . Diabetes ([place]) 11/26/2015  . Chronic diastolic congestive heart failure ([place]) 11/06/2015  . TIA (transient ischemic attack)   . Carotid-cavernous  fistula   . Insomnia 10/10/2015  . Depression 10/10/2015  . Benign essential HTN 08/15/2015  . Fibromyalgia 08/15/2015  . Peripheral neuropathy 08/15/2015  . CAD (coronary artery disease) 08/15/2015  . COPD (chronic obstructive pulmonary disease) ([place]) 08/15/2015  . Osteoporosis 08/15/2015  . 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 DDD (degenerative disc disease), lumbar 08/15/2015  . Occupational exposure to industrial toxins 08/15/2015  . GERD (gastroesophageal reflux disease) 08/15/2015    Current Outpatient Medications on File Prior to Visit  Medication Sig Dispense Refill  . amitriptyline (ELAVIL) 75 MG tablet Take 1 tablet (75 mg total) by mouth at bedtime. 30 tablet 5  . aspirin EC 81 MG tablet Take 1 tablet (81 mg total) by mouth daily.    Marland Kitchen atorvastatin (LIPITOR) 20 MG tablet Take 1 tablet by mouth every day 30 tablet 3  . cloNIDine (CATAPRES) 0.3 MG tablet Take 1 tablet by mouth every day 30 tablet 6  . diclofenac sodium (VOLTAREN) 1 % GEL Apply 2 g topically 4 (four) times daily as needed. 100 g 0  . fluticasone (FLONASE) 50 MCG/ACT nasal spray Shake liquid & use 1 spray into each nostril every day as needed for allergies 16 g 11  . furosemide (LASIX) 80 MG tablet Take 1 tablet by mouth every day 30 tablet 6  . gabapentin (NEURONTIN) 600 MG tablet Take 2 tablets by mouth 3 times a day 180 tablet 11  . glipiZIDE (GLUCOTROL) 5 MG tablet Take 1 tablet (5 mg total)  by mouth daily before breakfast. 30 tablet 5  . glucose blood test strip Use as instructed to check sugar three times a day 200 each 5  . ipratropium-albuterol (DUONEB) 0.5-2.5 (3) MG/3ML SOLN Take 3 mLs by nebulization every 4 (four) hours as needed. (Patient taking differently: Take 3 mLs by nebulization every 4 (four) hours as needed (SOB). ) 120 mL 0  . Lancets MISC Use as directed twice per day 200 each 5  . losartan (COZAAR) 50 MG tablet Take 1 tablet by mouth twice daily 60 tablet 6  . metoprolol (TOPROL-XL) 200 MG 24 hr  tablet Take 1 tablet by mouth twice daily 60 tablet 6  . nitroGLYCERIN (NITROSTAT) 0.4 MG SL tablet Place 0.4 mg under the tongue every 5 (five) minutes as needed for chest pain.    Marland Kitchen nystatin cream (MYCOSTATIN) Apply 1 application topically 2 (two) times daily. Apply to affected area BID for up to 7 days. 30 g 0  . omeprazole (PRILOSEC) 40 MG capsule Take 1 capsule by mouth every day 30 capsule 11  . oxyCODONE-acetaminophen (PERCOCET) 10-325 MG tablet Take 1 tablet by mouth every 6 (six) hours as needed for pain.   0  . OZEMPIC, 1 MG/DOSE, 2 MG/1.5ML SOPN Inject 1 mg subcutaneously once a week. 3 mL 11  . potassium chloride SA (KLOR-CON) 20 MEQ tablet Take 1 tablet by mouth every day 30 tablet 6  . sitaGLIPtin (JANUVIA) 100 MG tablet Take 1 tablet (100 mg total) by mouth daily. 30 tablet 5  . SYMBICORT 80-4.5 MCG/ACT inhaler Inhale 2 puffs into the lungs daily as needed 1 Inhaler 11  . tiZANidine (ZANAFLEX) 4 MG tablet Take 1 tablet (4 mg total) by mouth at bedtime. 90 tablet 1  . traZODone (DESYREL) 50 MG tablet Take 1 tablet (50 mg total) by mouth at bedtime as needed. for sleep 30 tablet 1   No current facility-administered medications on file prior to visit.    Past Medical History:  Diagnosis Date  . Anxiety   . Arthritis   . CHF (congestive heart failure) ([place])   . COPD (chronic obstructive pulmonary disease) ([place])   . Coronary artery disease   . Depression   . Diabetes mellitus without complication ([place])    type 2   . GERD (gastroesophageal reflux disease)   . Hypertension   . Lupus ([place])   . Migraines   . Neuromuscular disorder ([place])   . Osteoporosis   . Oxygen deficiency    pt uses 2.5L 02 at night   . Peripheral neuropathy   . Shingles   . Sleep apnea    had sleep study done recently ; unaware if she will be getting  a CPAP device ; patient states "im pretty sure i have it , i fall alseep all the time "  . Stroke [facility]) 10/2015    Past Surgical History:  Procedure  Laterality Date  . ABDOMINAL HYSTERECTOMY     ovaries left  . APPENDECTOMY    . CHOLECYSTECTOMY    . IR GENERIC HISTORICAL  11/07/2015   IR ANGIO INTRA EXTRACRAN SEL COM CAROTID INNOMINATE BILAT MOD SED 11/07/2015 Luanne Bras, MD [facility]  . IR GENERIC HISTORICAL  11/07/2015   IR ANGIO VERTEBRAL SEL SUBCLAVIAN INNOMINATE UNI R MOD SED 11/07/2015 Luanne Bras, MD [facility]  . IR GENERIC HISTORICAL  11/07/2015   IR ANGIO VERTEBRAL SEL VERTEBRAL UNI L MOD SED 11/07/2015 Luanne Bras, MD [facility]  . IR GENERIC  HISTORICAL  11/07/2015   IR ANGIOGRAM EXTREMITY LEFT 11/07/2015 Luanne Bras, MD [facility]  . TONSILLECTOMY    . TOTAL KNEE ARTHROPLASTY Left 03/12/2017   Procedure: LEFT TOTAL KNEE ARTHROPLASTY;  Surgeon: Mcarthur Rossetti, MD;  Location: [facility] OR[facility];  Service: Orthopedics;  Laterality: Left;  Adductor Block    Social History   Socioeconomic History  . Marital status: Single    Spouse name: n/a  . Number of children: 3  . Years of education: 12+  . Highest education level: Not on file  Occupational History  . 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 Smoking status: Current Every Day Smoker    Packs/day: 0.25    Years: 35.00    Pack years: 8.75    Types: Cigarettes  . Smokeless tobacco: Never Used  . Tobacco comment: referred  to smoking  cessation  classes. at  [facility]   Substance and Sexual Activity  . Alcohol use: No  . Drug use: No  . Sexual activity: Not Currently    Partners: Female    Birth control/protection: Surgical    Comment: hysterectomy  Other Topics Concern  . Not on file  Social History Narrative   Moved to [place] from [place], [place] February 2017, to help her daughter.   Lives with her daughter.   Sons live in [place] and [place].   She reports that there were originally 17 children in her family (she is the youngest), the  oldest are deceased, some prior to her birth, and she isn't sure which were female/female or how they died.   Social Determinants of Health   Financial Resource Strain:   . Difficulty of Paying Living Expenses: Not on file  Food Insecurity:   . Worried About Charity fundraiser in the Last Year: Not on file  . Ran Out of Food in the Last Year: Not on file  Transportation Needs:   . Lack of Transportation (Medical): Not on file  . Lack of Transportation (Non-Medical): Not on file  Physical Activity:   . Days of Exercise per Week: Not on file  . Minutes of Exercise per Session: Not on file  Stress:   . Feeling of Stress : Not on file  Social Connections:   . Frequency of Communication with Friends and Family: Not on file  . Frequency of Social Gatherings with Friends and Family: Not on file  . Attends Religious Services: Not on file  . Active Member of Clubs or Organizations: Not on file  . Attends Archivist Meetings: Not on file  . Marital Status: Not on file    Family History  Problem Relation Age of Onset  . Hyperlipidemia Mother   . Hypertension Mother   . Stroke Mother   . Thyroid disease Mother   . Heart attack Mother   . Hyperlipidemia Father   . Hypertension Father   . Stroke Father   . Heart attack Father   . Hypertension Sister   . Cancer Sister        breast cancer  . Stroke Sister   . Thyroid disease Sister   . Crohn's disease Sister   . Hypertension Sister   . Hypertension Brother   . Diabetes Brother   . Hypertension Brother     Review of Systems  Constitutional: Negative for chills and fever.  Respiratory: Positive for shortness of breath (with panic attacks). Negative for cough  and wheezing.   Cardiovascular: Negative for chest pain, palpitations and leg swelling.  Gastrointestinal:       GERD  Neurological: Negative for light-headedness and headaches.  Psychiatric/Behavioral: Positive for dysphoric mood (sometimes) and sleep disturbance.  The patient is nervous/anxious (panic attacks).        Objective:   Vitals:   05/24/19 0835  BP: 132/86  Pulse: 70  Resp: 16  Temp: 98.4 F (36.9 C)  SpO2: 93%   BP Readings from Last 3 Encounters:  05/24/19 132/86  04/26/19 122/78  03/01/19 120/80   Wt Readings from Last 3 Encounters:  05/24/19 (!) 303 lb (137.4 kg)  04/26/19 (!) 303 lb (137.4 kg)  03/01/19 (!) 302 lb (137 kg)   Body mass index is 48.91 kg/m.   Physical Exam    Constitutional: Appears well-developed and well-nourished. No distress.  HENT:  Head: Normocephalic and atraumatic.  Neck: Neck supple. No tracheal deviation present. No thyromegaly present.  No cervical lymphadenopathy Cardiovascular: Normal rate, regular rhythm and normal heart sounds.  No murmur heard. No carotid bruit .  Trace bilateral lower extremity edema Pulmonary/Chest: Effort normal and breath sounds normal. No respiratory distress. No has no wheezes. No rales.  Skin: Skin is warm and dry. Not diaphoretic. Left breast with three puncture marks from biopsies -medial upper, mid upper and lateral upper breast.  Lateral breast looks like a punch biopsy, no bleeding or active discharge.  Minimal erythema surrounding.  Applied antibacterial ointment and a Band-Aid.  other biopsy marks without discharge or erythema/swelling-minimal tenderness. Psychiatric: Normal mood and affect. Behavior is normal.      Assessment & Plan:    See Problem List for Assessment and Plan of chronic medical problems.    This visit occurred during the SARS-CoV-2 public health emergency.  Safety protocols were in place, including screening questions prior to the visit, additional usage of staff PPE, and extensive cleaning of exam room while observing appropriate contact time as indicated for disinfecting solutions.

## 2019-05-23 NOTE — Patient Instructions (Addendum)
  Blood work was ordered.     Medications reviewed and updated.  Changes include :   Increase ompeprazole to twice a day   Your prescription(s) have been submitted to your pharmacy. Please take as directed and contact our office if you believe you are having problem(s) with the medication(s).    Please followup in 3 months

## 2019-05-23 NOTE — Telephone Encounter (Signed)
Called back to let [facility] know response below. Was on hold for a while. Will discuss meds at follow up tomorrow.

## 2019-05-24 ENCOUNTER — Other Ambulatory Visit: Payer: Self-pay

## 2019-05-24 ENCOUNTER — Ambulatory Visit (INDEPENDENT_AMBULATORY_CARE_PROVIDER_SITE_OTHER): Payer: Medicare Other | Admitting: Orthopaedic Surgery

## 2019-05-24 ENCOUNTER — Encounter: Payer: Self-pay | Admitting: Internal Medicine

## 2019-05-24 ENCOUNTER — Other Ambulatory Visit: Payer: Self-pay | Admitting: Surgery

## 2019-05-24 ENCOUNTER — Ambulatory Visit (INDEPENDENT_AMBULATORY_CARE_PROVIDER_SITE_OTHER): Payer: Medicare Other | Admitting: Internal Medicine

## 2019-05-24 ENCOUNTER — Encounter: Payer: Self-pay | Admitting: Orthopaedic Surgery

## 2019-05-24 VITALS — Wt 302.0 lb

## 2019-05-24 VITALS — BP 132/86 | HR 70 | Temp 98.4°F | Resp 16 | Ht 66.0 in | Wt 303.0 lb

## 2019-05-24 DIAGNOSIS — G4700 Insomnia, unspecified: Secondary | ICD-10-CM

## 2019-05-24 DIAGNOSIS — E7849 Other hyperlipidemia: Secondary | ICD-10-CM | POA: Diagnosis not present

## 2019-05-24 DIAGNOSIS — M25561 Pain in right knee: Secondary | ICD-10-CM

## 2019-05-24 DIAGNOSIS — G8929 Other chronic pain: Secondary | ICD-10-CM

## 2019-05-24 DIAGNOSIS — M1711 Unilateral primary osteoarthritis, right knee: Secondary | ICD-10-CM

## 2019-05-24 DIAGNOSIS — E119 Type 2 diabetes mellitus without complications: Secondary | ICD-10-CM | POA: Diagnosis not present

## 2019-05-24 DIAGNOSIS — N6022 Fibroadenosis of left breast: Secondary | ICD-10-CM

## 2019-05-24 DIAGNOSIS — F419 Anxiety disorder, unspecified: Secondary | ICD-10-CM

## 2019-05-24 DIAGNOSIS — F32A Depression, unspecified: Secondary | ICD-10-CM

## 2019-05-24 DIAGNOSIS — I10 Essential (primary) hypertension: Secondary | ICD-10-CM | POA: Diagnosis not present

## 2019-05-24 DIAGNOSIS — F329 Major depressive disorder, single episode, unspecified: Secondary | ICD-10-CM

## 2019-05-24 DIAGNOSIS — K219 Gastro-esophageal reflux disease without esophagitis: Secondary | ICD-10-CM

## 2019-05-24 LAB — CBC WITH DIFFERENTIAL/PLATELET
Basophils Absolute: 0.1 10*3/uL (ref 0.0–0.1)
Basophils Relative: 0.9 % (ref 0.0–3.0)
Eosinophils Absolute: 0.2 10*3/uL (ref 0.0–0.7)
Eosinophils Relative: 3.2 % (ref 0.0–5.0)
HCT: 42.6 % (ref 36.0–46.0)
Hemoglobin: 14.1 g/dL (ref 12.0–15.0)
Lymphocytes Relative: 43.8 % (ref 12.0–46.0)
Lymphs Abs: 2.9 10*3/uL (ref 0.7–4.0)
MCHC: 33 g/dL (ref 30.0–36.0)
MCV: 87.7 fl (ref 78.0–100.0)
Monocytes Absolute: 0.4 10*3/uL (ref 0.1–1.0)
Monocytes Relative: 5.9 % (ref 3.0–12.0)
Neutro Abs: 3 10*3/uL (ref 1.4–7.7)
Neutrophils Relative %: 46.2 % (ref 43.0–77.0)
Platelets: 242 10*3/uL (ref 150.0–400.0)
RBC: 4.86 Mil/uL (ref 3.87–5.11)
RDW: 15.8 % — ABNORMAL HIGH (ref 11.5–15.5)
WBC: 6.6 10*3/uL (ref 4.0–10.5)

## 2019-05-24 LAB — COMPREHENSIVE METABOLIC PANEL
ALT: 35 U/L (ref 0–35)
AST: 76 U/L — ABNORMAL HIGH (ref 0–37)
Albumin: 3.9 g/dL (ref 3.5–5.2)
Alkaline Phosphatase: 127 U/L — ABNORMAL HIGH (ref 39–117)
BUN: 8 mg/dL (ref 6–23)
CO2: 33 mEq/L — ABNORMAL HIGH (ref 19–32)
Calcium: 9.1 mg/dL (ref 8.4–10.5)
Chloride: 99 mEq/L (ref 96–112)
Creatinine, Ser: 0.73 mg/dL (ref 0.40–1.20)
GFR: 99.38 mL/min (ref >60.00)
Glucose, Bld: 144 mg/dL — ABNORMAL HIGH (ref 70–99)
Potassium: 4 mEq/L (ref 3.5–5.1)
Sodium: 139 mEq/L (ref 135–145)
Total Bilirubin: 0.5 mg/dL (ref 0.2–1.2)
Total Protein: 8 g/dL (ref 6.0–8.3)

## 2019-05-24 LAB — HEMOGLOBIN A1C: Hgb A1c MFr Bld: 7.5 % — ABNORMAL HIGH (ref 4.6–6.5)

## 2019-05-24 LAB — LIPID PANEL
Cholesterol: 161 mg/dL (ref 0–200)
HDL: 55.3 mg/dL (ref >39.00)
LDL Cholesterol: 83 mg/dL (ref 0–99)
NonHDL: 105.84
Total CHOL/HDL Ratio: 3
Triglycerides: 113 mg/dL (ref 0.0–149.0)
VLDL: 22.6 mg/dL (ref 0.0–40.0)

## 2019-05-24 MED ORDER — OMEPRAZOLE 40 MG PO CPDR: 40.0000 mg | DELAYED_RELEASE_CAPSULE | Freq: Two times a day (BID) | ORAL | 5 refills | Status: DC

## 2019-05-24 NOTE — Assessment & Plan Note (Signed)
Chronic She does have some depression-sometimes she states Continue amitriptyline at current dose

## 2019-05-24 NOTE — Assessment & Plan Note (Signed)
Chronic BP well controlled Current regimen effective and well tolerated Continue current medications at current doses cmp  

## 2019-05-24 NOTE — Progress Notes (Signed)
The patient is being seen in follow-up today for her right knee arthritis.  She did have her hemoglobin A1c checked today with her primary care physician.  In July of last year it was 7.7.  She is someone who is morbidly obese with a BMI of over 40.  We have replaced her left knee before when she was thinner.  When we saw her in November 2020 her BMI was down to 46.  Today her BMI is up at 48.74.  She is gaining weight and she feels like it is fluid retention.  She has seen bariatric surgery before and is potentially considering a gastric sleeve.  She said that she would follow-up with those doctors again.  She has worked on weight loss in the past and understands that we cannot proceed with any surgery until we know her blood glucose is under better control and she shows a better trend with losing weight.  Unfortunate she has gained weight.  We counseled her about this extensively and have recommended she see a bariatric specialist.  We can certainly see her back in 3 months for repeat weight.  All question concerns were answered and addressed.  The patient meets the AMA guidelines for Morbid (severe) obesity with a BMI > 40.0 and I have recommended weight loss.

## 2019-05-24 NOTE — Assessment & Plan Note (Signed)
Chronic Check lipid panel  Continue daily statin Regular exercise and healthy diet encouraged  

## 2019-05-24 NOTE — Assessment & Plan Note (Signed)
Chronic She has had some panic attacks recently, which is likely related to increased stress related to having to have biopsies of her left breast At this point I do not want to make any medication changes because she is already on too many medications Can consider buspirone in the near future She has deferred seeing a psychiatrist in the past Continue amitriptyline at current dose

## 2019-05-24 NOTE — Assessment & Plan Note (Signed)
Chronic Not ideally controlled Check A1c May need to increase glipizide back up if sugars are too high Stressed that she needs to be eating regularly to prevent low sugars Stressed regular exercise, weight loss and diabetic diet

## 2019-05-24 NOTE — Assessment & Plan Note (Addendum)
Chronic Not controlled Increase omeprazole 40 mg to BID Stressed weight loss

## 2019-05-24 NOTE — Assessment & Plan Note (Signed)
Chronic Stressed the importance of weight loss for all of her medical problems and to reduce the amount of medication she is taking Advised her to increase her exercise Decrease portions, increase protein and vegetables-this did help her lose weight in the past and she needs to get back to this Follow-up in 3 months

## 2019-05-24 NOTE — Assessment & Plan Note (Signed)
Chronic She has not been sleeping well recently likely related to stress from her breast biopsies Continue amitriptyline at current dose Continue tizanidine at current dose Advised that I will not revise medications and that most likely her sleep will improve as her stress level improves

## 2019-05-26 DIAGNOSIS — M25559 Pain in unspecified hip: Secondary | ICD-10-CM | POA: Diagnosis not present

## 2019-05-26 DIAGNOSIS — Z79891 Long term (current) use of opiate analgesic: Secondary | ICD-10-CM | POA: Diagnosis not present

## 2019-05-26 DIAGNOSIS — M545 Low back pain: Secondary | ICD-10-CM | POA: Diagnosis not present

## 2019-05-26 DIAGNOSIS — G894 Chronic pain syndrome: Secondary | ICD-10-CM | POA: Diagnosis not present

## 2019-05-26 DIAGNOSIS — M25562 Pain in left knee: Secondary | ICD-10-CM | POA: Diagnosis not present

## 2019-05-27 ENCOUNTER — Inpatient Hospital Stay (HOSPITAL_COMMUNITY)
Admission: EM | Admit: 2019-05-27 | Discharge: 2019-05-29 | DRG: 439 | Disposition: A | Discharge status: Home / Self Care | Payer: Medicare Other | Attending: Internal Medicine | Admitting: Internal Medicine

## 2019-05-27 ENCOUNTER — Emergency Department (HOSPITAL_COMMUNITY): Payer: Medicare Other

## 2019-05-27 ENCOUNTER — Other Ambulatory Visit: Payer: Self-pay

## 2019-05-27 ENCOUNTER — Encounter (HOSPITAL_COMMUNITY): Payer: Self-pay

## 2019-05-27 DIAGNOSIS — E119 Type 2 diabetes mellitus without complications: Secondary | ICD-10-CM

## 2019-05-27 DIAGNOSIS — Z96652 Presence of left artificial knee joint: Secondary | ICD-10-CM | POA: Diagnosis present

## 2019-05-27 DIAGNOSIS — M797 Fibromyalgia: Secondary | ICD-10-CM | POA: Diagnosis present

## 2019-05-27 DIAGNOSIS — Z8349 Family history of other endocrine, nutritional and metabolic diseases: Secondary | ICD-10-CM

## 2019-05-27 DIAGNOSIS — Z8249 Family history of ischemic heart disease and other diseases of the circulatory system: Secondary | ICD-10-CM | POA: Diagnosis not present

## 2019-05-27 DIAGNOSIS — G8929 Other chronic pain: Secondary | ICD-10-CM | POA: Diagnosis not present

## 2019-05-27 DIAGNOSIS — E278 Other specified disorders of adrenal gland: Secondary | ICD-10-CM | POA: Diagnosis present

## 2019-05-27 DIAGNOSIS — M810 Age-related osteoporosis without current pathological fracture: Secondary | ICD-10-CM | POA: Diagnosis present

## 2019-05-27 DIAGNOSIS — Z6841 Body Mass Index (BMI) 40.0 and over, adult: Secondary | ICD-10-CM | POA: Diagnosis not present

## 2019-05-27 DIAGNOSIS — Z7982 Long term (current) use of aspirin: Secondary | ICD-10-CM

## 2019-05-27 DIAGNOSIS — Z823 Family history of stroke: Secondary | ICD-10-CM | POA: Diagnosis not present

## 2019-05-27 DIAGNOSIS — I2510 Atherosclerotic heart disease of native coronary artery without angina pectoris: Secondary | ICD-10-CM | POA: Diagnosis not present

## 2019-05-27 DIAGNOSIS — K5730 Diverticulosis of large intestine without perforation or abscess without bleeding: Secondary | ICD-10-CM | POA: Diagnosis not present

## 2019-05-27 DIAGNOSIS — M1990 Unspecified osteoarthritis, unspecified site: Secondary | ICD-10-CM | POA: Diagnosis present

## 2019-05-27 DIAGNOSIS — Z8673 Personal history of transient ischemic attack (TIA), and cerebral infarction without residual deficits: Secondary | ICD-10-CM

## 2019-05-27 DIAGNOSIS — F329 Major depressive disorder, single episode, unspecified: Secondary | ICD-10-CM | POA: Diagnosis present

## 2019-05-27 DIAGNOSIS — E785 Hyperlipidemia, unspecified: Secondary | ICD-10-CM | POA: Diagnosis present

## 2019-05-27 DIAGNOSIS — G4733 Obstructive sleep apnea (adult) (pediatric): Secondary | ICD-10-CM | POA: Diagnosis present

## 2019-05-27 DIAGNOSIS — I152 Hypertension secondary to endocrine disorders: Secondary | ICD-10-CM | POA: Diagnosis present

## 2019-05-27 DIAGNOSIS — Z803 Family history of malignant neoplasm of breast: Secondary | ICD-10-CM | POA: Diagnosis not present

## 2019-05-27 DIAGNOSIS — K859 Acute pancreatitis without necrosis or infection, unspecified: Secondary | ICD-10-CM

## 2019-05-27 DIAGNOSIS — Z79890 Hormone replacement therapy (postmenopausal): Secondary | ICD-10-CM

## 2019-05-27 DIAGNOSIS — Z20822 Contact with and (suspected) exposure to covid-19: Secondary | ICD-10-CM | POA: Diagnosis present

## 2019-05-27 DIAGNOSIS — K219 Gastro-esophageal reflux disease without esophagitis: Secondary | ICD-10-CM | POA: Diagnosis present

## 2019-05-27 DIAGNOSIS — Z833 Family history of diabetes mellitus: Secondary | ICD-10-CM

## 2019-05-27 DIAGNOSIS — K8500 Idiopathic acute pancreatitis without necrosis or infection: Secondary | ICD-10-CM | POA: Diagnosis not present

## 2019-05-27 DIAGNOSIS — R1110 Vomiting, unspecified: Secondary | ICD-10-CM | POA: Diagnosis not present

## 2019-05-27 DIAGNOSIS — R0602 Shortness of breath: Secondary | ICD-10-CM | POA: Diagnosis not present

## 2019-05-27 DIAGNOSIS — Z7984 Long term (current) use of oral hypoglycemic drugs: Secondary | ICD-10-CM

## 2019-05-27 DIAGNOSIS — E1169 Type 2 diabetes mellitus with other specified complication: Secondary | ICD-10-CM | POA: Diagnosis present

## 2019-05-27 DIAGNOSIS — R9431 Abnormal electrocardiogram [ECG] [EKG]: Secondary | ICD-10-CM | POA: Diagnosis not present

## 2019-05-27 DIAGNOSIS — E1142 Type 2 diabetes mellitus with diabetic polyneuropathy: Secondary | ICD-10-CM | POA: Diagnosis not present

## 2019-05-27 DIAGNOSIS — E0840 Diabetes mellitus due to underlying condition with diabetic neuropathy, unspecified: Secondary | ICD-10-CM

## 2019-05-27 DIAGNOSIS — J449 Chronic obstructive pulmonary disease, unspecified: Secondary | ICD-10-CM | POA: Diagnosis present

## 2019-05-27 DIAGNOSIS — G629 Polyneuropathy, unspecified: Secondary | ICD-10-CM

## 2019-05-27 DIAGNOSIS — Z7951 Long term (current) use of inhaled steroids: Secondary | ICD-10-CM

## 2019-05-27 DIAGNOSIS — I110 Hypertensive heart disease with heart failure: Secondary | ICD-10-CM | POA: Diagnosis not present

## 2019-05-27 DIAGNOSIS — I5032 Chronic diastolic (congestive) heart failure: Secondary | ICD-10-CM | POA: Diagnosis present

## 2019-05-27 DIAGNOSIS — I10 Essential (primary) hypertension: Secondary | ICD-10-CM | POA: Diagnosis not present

## 2019-05-27 DIAGNOSIS — E1149 Type 2 diabetes mellitus with other diabetic neurological complication: Secondary | ICD-10-CM

## 2019-05-27 DIAGNOSIS — Z79899 Other long term (current) drug therapy: Secondary | ICD-10-CM

## 2019-05-27 HISTORY — DX: Acute pancreatitis without necrosis or infection, unspecified: K85.90

## 2019-05-27 LAB — URINALYSIS, ROUTINE W REFLEX MICROSCOPIC
Bilirubin Urine: NEGATIVE
Glucose, UA: NEGATIVE mg/dL
Hgb urine dipstick: NEGATIVE
Ketones, ur: NEGATIVE mg/dL
Leukocytes,Ua: NEGATIVE
Nitrite: NEGATIVE
Protein, ur: NEGATIVE mg/dL
Specific Gravity, Urine: 1.005 (ref 1.005–1.030)
pH: 7 (ref 5.0–8.0)

## 2019-05-27 LAB — CBC WITH DIFFERENTIAL/PLATELET
Abs Immature Granulocytes: 0.04 10*3/uL (ref 0.00–0.07)
Basophils Absolute: 0 10*3/uL (ref 0.0–0.1)
Basophils Relative: 0 %
Eosinophils Absolute: 0.2 10*3/uL (ref 0.0–0.5)
Eosinophils Relative: 2 %
HCT: 44.2 % (ref 36.0–46.0)
Hemoglobin: 14.7 g/dL (ref 12.0–15.0)
Immature Granulocytes: 0 %
Lymphocytes Relative: 36 %
Lymphs Abs: 3.7 10*3/uL (ref 0.7–4.0)
MCH: 29.2 pg (ref 26.0–34.0)
MCHC: 33.3 g/dL (ref 30.0–36.0)
MCV: 87.9 fL (ref 80.0–100.0)
Monocytes Absolute: 0.8 10*3/uL (ref 0.1–1.0)
Monocytes Relative: 7 %
Neutro Abs: 5.6 10*3/uL (ref 1.7–7.7)
Neutrophils Relative %: 55 %
Platelets: 240 10*3/uL (ref 150–400)
RBC: 5.03 MIL/uL (ref 3.87–5.11)
RDW: 14.6 % (ref 11.5–15.5)
WBC: 10.4 10*3/uL (ref 4.0–10.5)
nRBC: 0 % (ref 0.0–0.2)

## 2019-05-27 LAB — COMPREHENSIVE METABOLIC PANEL
ALT: 38 U/L (ref 0–44)
AST: 63 U/L — ABNORMAL HIGH (ref 15–41)
Albumin: 3.9 g/dL (ref 3.5–5.0)
Alkaline Phosphatase: 118 U/L (ref 38–126)
Anion gap: 12 (ref 5–15)
BUN: 10 mg/dL (ref 6–20)
CO2: 26 mmol/L (ref 22–32)
Calcium: 9.1 mg/dL (ref 8.9–10.3)
Chloride: 100 mmol/L (ref 98–111)
Creatinine, Ser: 0.62 mg/dL (ref 0.44–1.00)
GFR calc Af Amer: >60 mL/min (ref >60)
GFR calc non Af Amer: >60 mL/min (ref >60)
Glucose, Bld: 131 mg/dL — ABNORMAL HIGH (ref 70–99)
Potassium: 4 mmol/L (ref 3.5–5.1)
Sodium: 138 mmol/L (ref 135–145)
Total Bilirubin: 0.8 mg/dL (ref 0.3–1.2)
Total Protein: 8.2 g/dL — ABNORMAL HIGH (ref 6.5–8.1)

## 2019-05-27 LAB — LIPASE, BLOOD: Lipase: 324 U/L — ABNORMAL HIGH (ref 11–51)

## 2019-05-27 MED ORDER — TIZANIDINE HCL 4 MG PO TABS
4.0000 mg | ORAL_TABLET | Freq: Every day | ORAL | Status: DC
  Administered 2019-05-27 – 2019-05-28 (×2): 4 mg via ORAL
  Filled 2019-05-27 (×2): qty 1

## 2019-05-27 MED ORDER — ASPIRIN EC 81 MG PO TBEC
81.0000 mg | DELAYED_RELEASE_TABLET | Freq: Every day | ORAL | Status: DC
  Administered 2019-05-28 – 2019-05-29 (×2): 81 mg via ORAL
  Filled 2019-05-27 (×2): qty 1

## 2019-05-27 MED ORDER — MORPHINE SULFATE (PF) 4 MG/ML IV SOLN
4.0000 mg | Freq: Once | INTRAVENOUS | Status: AC
  Administered 2019-05-27: 4 mg via INTRAVENOUS
  Filled 2019-05-27: qty 1

## 2019-05-27 MED ORDER — ENOXAPARIN SODIUM 60 MG/0.6ML ~~LOC~~ SOLN
60.0000 mg | SUBCUTANEOUS | Status: DC
  Administered 2019-05-27 – 2019-05-28 (×2): 60 mg via SUBCUTANEOUS
  Filled 2019-05-27 (×3): qty 0.6

## 2019-05-27 MED ORDER — FUROSEMIDE 40 MG PO TABS
80.0000 mg | ORAL_TABLET | Freq: Every day | ORAL | Status: DC
  Administered 2019-05-28: 80 mg via ORAL
  Filled 2019-05-27: qty 2

## 2019-05-27 MED ORDER — ALUM & MAG HYDROXIDE-SIMETH 200-200-20 MG/5ML PO SUSP
30.0000 mL | Freq: Once | ORAL | Status: AC
  Administered 2019-05-27: 30 mL via ORAL
  Filled 2019-05-27: qty 30

## 2019-05-27 MED ORDER — IOHEXOL 300 MG/ML  SOLN
100.0000 mL | Freq: Once | INTRAMUSCULAR | Status: AC | PRN
  Administered 2019-05-27: 100 mL via INTRAVENOUS

## 2019-05-27 MED ORDER — SODIUM CHLORIDE 0.9% FLUSH: 3.0000 mL | Freq: Once | INTRAVENOUS | Status: DC

## 2019-05-27 MED ORDER — CLONIDINE HCL 0.1 MG PO TABS
0.3000 mg | ORAL_TABLET | Freq: Every day | ORAL | Status: DC
  Administered 2019-05-28 – 2019-05-29 (×2): 0.3 mg via ORAL
  Filled 2019-05-27 (×2): qty 3

## 2019-05-27 MED ORDER — MOMETASONE FURO-FORMOTEROL FUM 100-5 MCG/ACT IN AERO
2.0000 | INHALATION_SPRAY | Freq: Two times a day (BID) | RESPIRATORY_TRACT | Status: DC
  Filled 2019-05-27: qty 8.8

## 2019-05-27 MED ORDER — POTASSIUM CHLORIDE CRYS ER 20 MEQ PO TBCR
20.0000 meq | EXTENDED_RELEASE_TABLET | Freq: Every day | ORAL | Status: DC
  Administered 2019-05-28 – 2019-05-29 (×2): 20 meq via ORAL
  Filled 2019-05-27 (×2): qty 1

## 2019-05-27 MED ORDER — TRAZODONE HCL 50 MG PO TABS
50.0000 mg | ORAL_TABLET | Freq: Every evening | ORAL | Status: DC | PRN
  Administered 2019-05-27 – 2019-05-28 (×2): 50 mg via ORAL
  Filled 2019-05-27 (×2): qty 1

## 2019-05-27 MED ORDER — PANTOPRAZOLE SODIUM 40 MG PO TBEC
40.0000 mg | DELAYED_RELEASE_TABLET | Freq: Every day | ORAL | Status: DC
  Administered 2019-05-28: 40 mg via ORAL
  Filled 2019-05-27: qty 1

## 2019-05-27 MED ORDER — LOSARTAN POTASSIUM 50 MG PO TABS
50.0000 mg | ORAL_TABLET | Freq: Every day | ORAL | Status: DC
  Administered 2019-05-28 – 2019-05-29 (×2): 50 mg via ORAL
  Filled 2019-05-27 (×2): qty 1

## 2019-05-27 MED ORDER — METOPROLOL SUCCINATE ER 100 MG PO TB24
200.0000 mg | ORAL_TABLET | Freq: Two times a day (BID) | ORAL | Status: DC
  Administered 2019-05-27 – 2019-05-29 (×4): 200 mg via ORAL
  Filled 2019-05-27 (×4): qty 2

## 2019-05-27 MED ORDER — LIDOCAINE VISCOUS HCL 2 % MT SOLN
15.0000 mL | Freq: Once | OROMUCOSAL | Status: AC
  Administered 2019-05-27: 15 mL via ORAL
  Filled 2019-05-27: qty 15

## 2019-05-27 MED ORDER — AMITRIPTYLINE HCL 25 MG PO TABS
75.0000 mg | ORAL_TABLET | Freq: Every day | ORAL | Status: DC
  Administered 2019-05-27 – 2019-05-28 (×2): 75 mg via ORAL
  Filled 2019-05-27 (×2): qty 3

## 2019-05-27 MED ORDER — ATORVASTATIN CALCIUM 20 MG PO TABS
20.0000 mg | ORAL_TABLET | Freq: Every day | ORAL | Status: DC
  Administered 2019-05-28: 20 mg via ORAL
  Filled 2019-05-27: qty 1

## 2019-05-27 MED ORDER — PANTOPRAZOLE SODIUM 40 MG IV SOLR
40.0000 mg | Freq: Once | INTRAVENOUS | Status: AC
  Administered 2019-05-27: 40 mg via INTRAVENOUS
  Filled 2019-05-27: qty 40

## 2019-05-27 MED ORDER — SODIUM CHLORIDE 0.9 % IV BOLUS
1000.0000 mL | Freq: Once | INTRAVENOUS | Status: AC
  Administered 2019-05-27: 1000 mL via INTRAVENOUS

## 2019-05-27 MED ORDER — SODIUM CHLORIDE 0.9 % IV SOLN: INTRAVENOUS | Status: DC

## 2019-05-27 MED ORDER — GABAPENTIN 300 MG PO CAPS
600.0000 mg | ORAL_CAPSULE | Freq: Two times a day (BID) | ORAL | Status: DC
  Administered 2019-05-27 – 2019-05-29 (×4): 600 mg via ORAL
  Filled 2019-05-27 (×4): qty 2

## 2019-05-27 MED ORDER — SODIUM CHLORIDE (PF) 0.9 % IJ SOLN
INTRAMUSCULAR | Status: AC
  Filled 2019-05-27: qty 50

## 2019-05-27 MED ORDER — MORPHINE SULFATE (PF) 2 MG/ML IV SOLN
2.0000 mg | INTRAVENOUS | Status: DC | PRN
  Administered 2019-05-27 – 2019-05-28 (×4): 2 mg via INTRAVENOUS
  Filled 2019-05-27 (×4): qty 1

## 2019-05-27 NOTE — Progress Notes (Addendum)
Pt refused cpap tonight.  Pt stated she only uses it sometimes at home.  Pt encouraged to call should she change her mind.

## 2019-05-27 NOTE — ED Notes (Signed)
Attempted to call report nurse currently on phone with provider.

## 2019-05-27 NOTE — Progress Notes (Signed)
Lovenox per Pharmacy for DVT Prophylaxis    Pharmacy has been consulted from dosing enoxaparin (lovenox) in this patient for DVT prophylaxis.  The pharmacist has reviewed pertinent labs (Hgb 14.7___; PLT_240__), patient weight (_137__kg) and renal function (CrCl__>90_mL/min) and decided that enoxaparin _60_mg SQ Q24Hrs is appropriate for this patient.  The pharmacy department will sign off at this time.  Please reconsult pharmacy if status changes or for further issues.  Thank you  Cyndia Diver PharmD, BCPS  05/27/2019, 9:53 PM

## 2019-05-27 NOTE — ED Notes (Signed)
Pt provided Chicken broth per admitting MD

## 2019-05-27 NOTE — ED Provider Notes (Signed)
[facility] DEPT Provider Note   CSN: [id number] Arrival date & time: 05/27/19  1600     History Chief Complaint  Patient presents with  . Shortness of Breath  . Abdominal Pain    Tammy Strickland is a 59 y.o. female presenting for evaluation of abdominal pain and nausea.  Patient states for the past 6 days, she has had persistent abdominal pain.  Pain is mostly epigastric, radiates to bilateral upper quadrants.  She has associated nausea, was dry heaving yesterday.  Patient states that when she breathes her abdominal pain feels worse.  She denies fevers, chills, cough, chest pain, urinary symptoms, abnormal bowel movements.  She denies lower abdominal pain.  She is status post cholecystectomy and appendectomy.  She reports tobacco use and intermittent alcohol use.  Patient states she did not drink alcohol the 2 days prior to her symptoms beginning.  She did have 1 glass of alcohol 2 days ago which worsened her symptoms, has not had any since.  She denies history of pancreatitis.  She has not taken any medicine for pain today.  She normally takes oxycodone for chronic pain, has not had this today.  She denies sick contacts.  She does have a history of reflux, takes Protonix for this.  She has never had an EGD.  She denies significant NSAID use.  Additional history obtained from chart review.  Patient with a history of hypertension, COPD, CHF, diabetes, fibromyalgia, peripheral neuropathy, CAD, GERD, CVA, OSA, anxiety, obesity.  HPI     Past Medical History:  Diagnosis Date  . Anxiety   . Arthritis   . CHF (congestive heart failure) ([place])   . COPD (chronic obstructive pulmonary disease) ([place])   . Coronary artery disease   . Depression   . Diabetes mellitus without complication ([place])    type 2   . GERD (gastroesophageal reflux disease)   . Hypertension   . Lupus ([place])   . Migraines   . Neuromuscular disorder ([place])   . Osteoporosis   . Oxygen  deficiency    pt uses 2.5L 02 at night   . Peripheral neuropathy   . Shingles   . Sleep apnea    had sleep study done recently ; unaware if she will be getting  a CPAP device ; patient states "im pretty sure i have it , i fall alseep all the time "  . Stroke [facility]) 10/2015    Patient Active Problem List   Diagnosis Date Noted  . Severe obesity (BMI >= 40) ([place]) 05/24/2019  . TMJ arthralgia 04/26/2019  . Sore throat 04/26/2019  . Unilateral primary osteoarthritis, right knee 01/16/2019  . Vaginal yeast infection 11/01/2018  . Anxiety 11/01/2018  . RUQ pain 06/22/2018  . Fatty liver 06/19/2018  . Lactic acid acidosis 06/15/2018  . Transaminitis 06/15/2018  . Chronic bilateral low back pain without sciatica 05/17/2018  . Body mass index 50.0-59.9, adult ([place]) 05/17/2018  . It band syndrome, left 12/29/2017  . Trochanteric bursitis, left hip 12/29/2017  . Dysphagia 04/29/2017  . History of left knee replacement 04/21/2017  . Pain in left ankle and joints of left foot 04/21/2017  . Pain in left hand 04/21/2017  . OSA (obstructive sleep apnea) 02/23/2017  . Acute gouty arthritis 12/25/2016  . Stroke ([place])   . Chronic fatigue 11/09/2016  . Hypoxia 11/09/2016  . Morbidly obese ([place]) 11/09/2016  . Rash and nonspecific skin eruption 07/21/2016  . Frequent headaches 07/21/2016  . Allergic rhinitis  07/16/2016  . Vitamin D deficiency 04/16/2016  . Hyperlipidemia 01/15/2016  . Diabetes ([place]) 11/26/2015  . Chronic diastolic congestive heart failure ([place]) 11/06/2015  . TIA (transient ischemic attack)   . Carotid-cavernous fistula   . Insomnia 10/10/2015  . Depression 10/10/2015  . Benign essential HTN 08/15/2015  . Fibromyalgia 08/15/2015  . Peripheral neuropathy 08/15/2015  . CAD (coronary artery disease) 08/15/2015  . COPD (chronic obstructive pulmonary disease) ([place]) 08/15/2015  . Osteoporosis 08/15/2015  . 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 DDD (degenerative disc disease), lumbar  08/15/2015  . Occupational exposure to industrial toxins 08/15/2015  . GERD (gastroesophageal reflux disease) 08/15/2015    Past Surgical History:  Procedure Laterality Date  . ABDOMINAL HYSTERECTOMY     ovaries left  . APPENDECTOMY    . CHOLECYSTECTOMY    . IR GENERIC HISTORICAL  11/07/2015   IR ANGIO INTRA EXTRACRAN SEL COM CAROTID INNOMINATE BILAT MOD SED 11/07/2015 Luanne Bras, MD [facility]  . IR GENERIC HISTORICAL  11/07/2015   IR ANGIO VERTEBRAL SEL SUBCLAVIAN INNOMINATE UNI R MOD SED 11/07/2015 Luanne Bras, MD [facility]  . IR GENERIC HISTORICAL  11/07/2015   IR ANGIO VERTEBRAL SEL VERTEBRAL UNI L MOD SED 11/07/2015 Luanne Bras, MD [facility]  . IR GENERIC HISTORICAL  11/07/2015   IR ANGIOGRAM EXTREMITY LEFT 11/07/2015 Luanne Bras, MD [facility]  . TONSILLECTOMY    . TOTAL KNEE ARTHROPLASTY Left 03/12/2017   Procedure: LEFT TOTAL KNEE ARTHROPLASTY;  Surgeon: Mcarthur Rossetti, MD;  Location: [facility] ORS;  Service: Orthopedics;  Laterality: Left;  Adductor Block     OB History    Gravida  3   Para  3   Term  3   Preterm      AB      Living  3     SAB      TAB      Ectopic      Multiple      Live Births  3           Family History  Problem Relation Age of Onset  . Hyperlipidemia Mother   . Hypertension Mother   . Stroke Mother   . Thyroid disease Mother   . Heart attack Mother   . Hyperlipidemia Father   . Hypertension Father   . Stroke Father   . Heart attack Father   . Hypertension Sister   . Cancer Sister        breast cancer  . Stroke Sister   . Thyroid disease Sister   . Crohn's disease Sister   . Hypertension Sister   . Hypertension Brother   . Diabetes Brother   . Hypertension Brother     Social History   Tobacco Use  . Smoking status: Current Every Day Smoker    Packs/day: 0.25    Years: 35.00    Pack years: 8.75    Types: Cigarettes  . Smokeless tobacco: Never Used  . Tobacco comment:  referred  to smoking  cessation  classes. at  [facility]   Substance Use Topics  . Alcohol use: No  . Drug use: No    Home Medications Prior to Admission medications   Medication Sig Start Date End Date Taking? Authorizing Provider  amitriptyline (ELAVIL) 75 MG tablet Take 1 tablet (75 mg total) by mouth at bedtime. 02/03/19   Binnie Rail, MD  aspirin EC 81 MG tablet Take 1 tablet (81 mg total) by mouth daily. 11/01/15  Elgergawy, Silver Huguenin, MD  atorvastatin (LIPITOR) 20 MG tablet Take 1 tablet by mouth every day 03/13/19   Binnie Rail, MD  cloNIDine (CATAPRES) 0.3 MG tablet Take 1 tablet by mouth every day 04/25/19   Binnie Rail, MD  diclofenac sodium (VOLTAREN) 1 % GEL Apply 2 g topically 4 (four) times daily as needed. 06/17/18   Mikhail, Velta Addison, DO  fluticasone (FLONASE) 50 MCG/ACT nasal spray Shake liquid & use 1 spray into each nostril every day as needed for allergies 02/23/19   Binnie Rail, MD  furosemide (LASIX) 80 MG tablet Take 1 tablet by mouth every day 04/25/19   Binnie Rail, MD  gabapentin (NEURONTIN) 600 MG tablet Take 2 tablets by mouth 3 times a day 02/23/19   Binnie Rail, MD  glipiZIDE (GLUCOTROL) 5 MG tablet Take 1 tablet (5 mg total) by mouth daily before breakfast. 04/26/19   Burns, Claudina Lick, MD  glucose blood test strip Use as instructed to check sugar three times a day 01/28/17   Binnie Rail, MD  ipratropium-albuterol (DUONEB) 0.5-2.5 (3) MG/3ML SOLN Take 3 mLs by nebulization every 4 (four) hours as needed. Patient taking differently: Take 3 mLs by nebulization every 4 (four) hours as needed (SOB).  05/26/18   Raylene Everts, MD  Lancets MISC Use as directed twice per day 01/28/17   Binnie Rail, MD  losartan (COZAAR) 50 MG tablet Take 1 tablet by mouth twice daily 04/25/19   Binnie Rail, MD  metoprolol (TOPROL-XL) 200 MG 24 hr tablet Take 1 tablet by mouth twice daily 04/25/19   Binnie Rail, MD  nitroGLYCERIN (NITROSTAT) 0.4 MG SL tablet Place 0.4  mg under the tongue every 5 (five) minutes as needed for chest pain.    [provider]  nystatin cream (MYCOSTATIN) Apply 1 application topically 2 (two) times daily. Apply to affected area BID for up to 7 days. 03/01/19   Salvadore Dom, MD  omeprazole (PRILOSEC) 40 MG capsule Take 1 capsule (40 mg total) by mouth 2 (two) times daily before a meal. 05/24/19   Burns, Claudina Lick, MD  oxyCODONE-acetaminophen (PERCOCET) 10-325 MG tablet Take 1 tablet by mouth every 6 (six) hours as needed for pain.  01/31/18   [provider]  OZEMPIC, 1 MG/DOSE, 2 MG/1.5ML SOPN Inject 1 mg subcutaneously once a week. 03/13/19   Binnie Rail, MD  potassium chloride SA (KLOR-CON) 20 MEQ tablet Take 1 tablet by mouth every day 04/25/19   Binnie Rail, MD  sitaGLIPtin (JANUVIA) 100 MG tablet Take 1 tablet (100 mg total) by mouth daily. 08/22/18   Binnie Rail, MD  SYMBICORT 80-4.5 MCG/ACT inhaler Inhale 2 puffs into the lungs daily as needed 03/13/19   Binnie Rail, MD  tiZANidine (ZANAFLEX) 4 MG tablet Take 1 tablet (4 mg total) by mouth at bedtime. 02/14/19   Binnie Rail, MD  traZODone (DESYREL) 50 MG tablet Take 1 tablet (50 mg total) by mouth at bedtime as needed. for sleep 05/12/19   Binnie Rail, MD    Allergies    Metformin and related and Bacitracin  Review of Systems   Review of Systems  Gastrointestinal: Positive for abdominal pain and nausea.  All other systems reviewed and are negative.   Physical Exam Updated Vital Signs BP (!) 143/97   Pulse 73   Temp 98.8 F (37.1 C) (Oral)   Resp 18   SpO2 96%  Physical Exam Vitals and nursing note reviewed.  Constitutional:      General: She is not in acute distress.    Appearance: She is well-developed. She is obese.     Comments: Obese female who appears uncomfortable due to pain, otherwise nontoxic  HENT:     Head: Normocephalic and atraumatic.  Eyes:     Conjunctiva/sclera: Conjunctivae normal.     Pupils: Pupils  are equal, round, and reactive to light.  Cardiovascular:     Rate and Rhythm: Normal rate and regular rhythm.     Pulses: Normal pulses.  Pulmonary:     Effort: Pulmonary effort is normal. No respiratory distress.     Breath sounds: Normal breath sounds. No wheezing.  Abdominal:     General: There is no distension.     Palpations: Abdomen is soft. There is no mass.     Tenderness: There is abdominal tenderness. There is no guarding or rebound.     Comments: Obese abdomen.  Tenderness palpation of epigastric abdomen, right upper quadrant, and left upper quadrants.  Patient is palpation of lower abdomen.  No rigidity, guarding, distention.  Negative rebound.  No peritonitis.  No CVA tenderness.  Musculoskeletal:        General: Normal range of motion.     Cervical back: Normal range of motion and neck supple.     Right lower leg: No edema.     Left lower leg: No edema.  Skin:    General: Skin is warm and dry.  Neurological:     Mental Status: She is alert and oriented to person, place, and time.     ED Results / Procedures / Treatments   Labs (all labs ordered are listed, but only abnormal results are displayed) Labs Reviewed  LIPASE, BLOOD - Abnormal; Notable for the following components:      Result Value   Lipase 324 (*)    All other components within normal limits  COMPREHENSIVE METABOLIC PANEL - Abnormal; Notable for the following components:   Glucose, Bld 131 (*)    Total Protein 8.2 (*)    AST 63 (*)    All other components within normal limits  URINALYSIS, ROUTINE W REFLEX MICROSCOPIC - Abnormal; Notable for the following components:   Bacteria, UA RARE (*)    All other components within normal limits  SARS CORONAVIRUS 2 (TAT 6-24 HRS)  CBC WITH DIFFERENTIAL/PLATELET    EKG None  Radiology DG Chest 2 View  Result Date: 05/27/2019 CLINICAL DATA:  SOB, nausea, abdomen pain x 1 week, hx hypertension EXAM: CHEST - 2 VIEW COMPARISON:  Chest radiograph 06/15/2018  FINDINGS: Stable cardiomediastinal contours. Chronic coarse bilateral interstitial thickening. No new focal pulmonary opacity. No pneumothorax or pleural effusion. Multilevel degenerative changes in the thoracic spine. IMPRESSION: No acute cardiopulmonary abnormality.  Chronic bronchitic change. Electronically Signed   By: Audie Pinto M.D.   On: 05/27/2019 16:39   CT ABDOMEN PELVIS W CONTRAST  Result Date: 05/27/2019 CLINICAL DATA:  Abdominal distension, nausea vomiting and upper abdominal pain EXAM: CT ABDOMEN AND PELVIS WITH CONTRAST TECHNIQUE: Multidetector CT imaging of the abdomen and pelvis was performed using the standard protocol following bolus administration of intravenous contrast. CONTRAST:  131mL OMNIPAQUE IOHEXOL 300 MG/ML  SOLN COMPARISON:  CT 06/15/2018 FINDINGS: Lower chest: Lung bases are clear. Normal heart size. No pericardial effusion. Hepatobiliary: No focal liver abnormality is seen. Patient is post cholecystectomy. Slight prominence of the biliary tree likely related to reservoir effect. No  calcified intraductal gallstones. Pancreas: Mild edematous changes are noted about the pancreatic head and uncinate. No pancreatic ductal dilatation, necrotic pancreatic tissue, or peripancreatic collections. Spleen: Normal in size without focal abnormality. Adrenals/Urinary Tract: Stable 1 cm nodule in the left adrenal gland. No worrisome new or suspicious nodules. Kidneys enhance and excrete symmetrically. No visible, focal lesion, or hydronephrosis. Bladder is unremarkable. Stomach/Bowel: Distal esophagus, stomach and duodenal sweep are unremarkable. No small bowel wall thickening or dilatation. No evidence of obstruction. The appendix is surgically absent. No colonic dilatation or wall thickening. Scattered colonic diverticula without focal pericolonic inflammation to suggest diverticulitis. Vascular/Lymphatic: Atherosclerotic plaque within the normal caliber aorta. No suspicious or enlarged  lymph nodes in the included lymphatic chains. Reproductive: Uterus is surgically absent. No concerning adnexal lesions. Other: Faint edematous changes near the pancreatic head and uncinate. Small fat containing umbilical hernia. Mild posterior body wall edema. Musculoskeletal: Multilevel degenerative changes are present in the imaged portions of the spine. No acute osseous abnormality or suspicious osseous lesion. IMPRESSION: 1. Mild edematous changes about the pancreatic head and uncinate process may represent early pancreatitis. No pancreatic ductal dilatation, necrotic pancreatic tissue or peripancreatic collections. 2. Colonic diverticulosis without evidence of diverticulitis. 3. Stable 1 cm left adrenal nodule. Stability favors benign etiology such as adenoma. 4. Aortic Atherosclerosis (ICD10-I70.0). Electronically Signed   By: Lovena Le M.D.   On: 05/27/2019 19:06    Procedures Procedures (including critical care time)  Medications Ordered in ED Medications  sodium chloride flush (NS) 0.9 % injection 3 mL (3 mLs Intravenous Not Given 05/27/19 1649)  sodium chloride (PF) 0.9 % injection (  Not Given 05/27/19 1855)  pantoprazole (PROTONIX) injection 40 mg (40 mg Intravenous Given 05/27/19 1716)  alum & mag hydroxide-simeth (MAALOX/MYLANTA) 200-200-20 MG/5ML suspension 30 mL (30 mLs Oral Given 05/27/19 1716)    And  lidocaine (XYLOCAINE) 2 % viscous mouth solution 15 mL (15 mLs Oral Given 05/27/19 1716)  morphine 4 MG/ML injection 4 mg (4 mg Intravenous Given 05/27/19 1817)  sodium chloride 0.9 % bolus 1,000 mL (1,000 mLs Intravenous New Bag/Given 05/27/19 1830)  iohexol (OMNIPAQUE) 300 MG/ML solution 100 mL (100 mLs Intravenous Contrast Given 05/27/19 1845)    ED Course  I have reviewed the triage vital signs and the nursing notes.  Pertinent labs & imaging results that were available during my care of the patient were reviewed by me and considered in my medical decision making (see chart for  details).    MDM Rules/Calculators/A&P                      Patient presenting for evaluation of abdominal pain and nausea.  Physical exam shows patient who appears uncomfortable, but otherwise nontoxic.  She does have tenderness palpation of the upper abdomen.  Consider PUD versus gastritis versus reflux versus pancreatitis.  Consider retained stone, although cholecystectomy was over 20 years ago, less likely.  Will obtain labs and urine.  Chest x-ray and EKG obtained from triage.  Chest x-ray viewed interpreted by me, no pneumonia pneumothorax, effusion, cardiomegaly.  Will start treatment with Protonix and GI cocktail and reassess.  On reassessment, patient reports no improvement of symptoms.  Will give morphine for pain control.  Labs interpreted by me.  Elevation in lipase 324.  White count high normal at 10.4.  Otherwise reassuring.  Likely pancreatitis.  CT pending.  CT consistent with early acute pancreatitis.  Discussed findings in plan with patient, who is agreeable.  Will  call for admission.  Discussed with Dr. Flossie Buffy from [facility]ad hospitalist service, pt to be admitted.   Final Clinical Impression(s) / ED Diagnoses Final diagnoses:  Acute pancreatitis, unspecified complication status, unspecified pancreatitis type    Rx / DC Orders ED Discharge Orders    None       Erick Alley 05/27/19 2012    Dorie Rank, MD 05/28/19 1313

## 2019-05-27 NOTE — ED Notes (Signed)
Admitting MD at bedside.

## 2019-05-27 NOTE — H&P (Signed)
History and Physical    Tammy Strickland [id number] DOB: 1960-05-25 DOA: 05/27/2019  PCP: Binnie Rail, MD  Patient coming from: Home, patient lives alone but has daughter that checks on her  I have personally briefly reviewed patient's old medical records in [place]  Chief Complaint: Persistent epigastric abdominal pain  HPI: Tammy Strickland is a 59 y.o. female with medical history significant for diastolic congestive heart failure, CAD, CVA, hypertension, COPD, OSA, type 2 diabetes with neuropathy and morbid obesity who presents with concerns of persistent epigastric abdominal pain. Symptoms started acutely about 5 days ago.  Pain is in the epigastric area and would radiate lower to bilateral anterior ribs with deep respiration.  Pain is constant and crampy in nature.  It feels better only when she sits up and walk rocks her body back and forth.  She denies any associated nausea or vomiting although she did have 1 episode of vomiting yesterday.  Pain is not initially worse with food but would hurt several hours afterwards.  She continues to have normal bowel movements and denies any diarrhea. She had called EMS last night but decided to wait it out and came in today since symptoms were not improved.  In the ER, she was afebrile and hypertensive up to systolic [id number] on room air.  CBC was unremarkable.  Lipase of 324.  CT abdomen pelvis showed mild edematous changes about the pancreatic head and also neck process representing early pancreatitis.  Patient is s/p cholecystectomy and she endorsed only occasional alcohol use of about half a cup at a time.  Her last use was about 2 days ago which was after her symptoms have already started.  She also recently had a lipid panel done on 05/23/18/2021 and her triglycerides were noted to be normal at 113.  She denies any recent changes or new medication.  She received 1L NS, protonix 40mg , morphine 4mg  IV, Maalox in the  ED.  Review of Systems:  Constitutional: No Weight Change, No Fever ENT/Mouth: No sore throat, No Rhinorrhea Eyes: No Eye Pain, No Vision Changes Cardiovascular: No Chest Pain, no SOB Respiratory: No Cough, No Sputum Gastrointestinal: No Nausea, + Vomiting, No Diarrhea, No Constipation,+ Pain Genitourinary: no Urinary Incontinence Musculoskeletal: No Arthralgias, No Myalgias Skin: No Skin Lesions, No Pruritus, Neuro: no Weakness, No Numbness,  Psych: No Anxiety/Panic, No Depression, no decrease appetite Heme/Lymph: No Bruising, No Bleeding   Past Medical History:  Diagnosis Date  . Anxiety   . Arthritis   . CHF (congestive heart failure) ([place])   . COPD (chronic obstructive pulmonary disease) ([place])   . Coronary artery disease   . Depression   . Diabetes mellitus without complication ([place])    type 2   . GERD (gastroesophageal reflux disease)   . Hypertension   . Lupus ([place])   . Migraines   . Neuromuscular disorder ([place])   . Osteoporosis   . Oxygen deficiency    pt uses 2.5L 02 at night   . Peripheral neuropathy   . Shingles   . Sleep apnea    had sleep study done recently ; unaware if she will be getting  a CPAP device ; patient states "im pretty sure i have it , i fall alseep all the time "  . Stroke [facility]) 10/2015    Past Surgical History:  Procedure Laterality Date  . ABDOMINAL HYSTERECTOMY     ovaries left  . APPENDECTOMY    . CHOLECYSTECTOMY    .  IR GENERIC HISTORICAL  11/07/2015   IR ANGIO INTRA EXTRACRAN SEL COM CAROTID INNOMINATE BILAT MOD SED 11/07/2015 Luanne Bras, MD [facility]  . IR GENERIC HISTORICAL  11/07/2015   IR ANGIO VERTEBRAL SEL SUBCLAVIAN INNOMINATE UNI R MOD SED 11/07/2015 Luanne Bras, MD [facility]  . IR GENERIC HISTORICAL  11/07/2015   IR ANGIO VERTEBRAL SEL VERTEBRAL UNI L MOD SED 11/07/2015 Luanne Bras, MD [facility]  . IR GENERIC HISTORICAL  11/07/2015   IR ANGIOGRAM EXTREMITY LEFT 11/07/2015 Luanne Bras, MD  [facility]  . TONSILLECTOMY    . TOTAL KNEE ARTHROPLASTY Left 03/12/2017   Procedure: LEFT TOTAL KNEE ARTHROPLASTY;  Surgeon: Mcarthur Rossetti, MD;  Location: [facility];  Service: Orthopedics;  Laterality: Left;  Adductor Block     reports that she has been smoking cigarettes. She has a 8.75 pack-year smoking history. She has never used smokeless tobacco. She reports that she does not drink alcohol or use drugs.  Allergies  Allergen Reactions  . Metformin And Related     Lactic acidosis  . Bacitracin Hives    Family History  Problem Relation Age of Onset  . Hyperlipidemia Mother   . Hypertension Mother   . Stroke Mother   . Thyroid disease Mother   . Heart attack Mother   . Hyperlipidemia Father   . Hypertension Father   . Stroke Father   . Heart attack Father   . Hypertension Sister   . Cancer Sister        breast cancer  . Stroke Sister   . Thyroid disease Sister   . Crohn's disease Sister   . Hypertension Sister   . Hypertension Brother   . Diabetes Brother   . Hypertension Brother      Prior to Admission medications   Medication Sig Start Date End Date Taking? Authorizing Provider  amitriptyline (ELAVIL) 75 MG tablet Take 1 tablet (75 mg total) by mouth at bedtime. 02/03/19   Binnie Rail, MD  aspirin EC 81 MG tablet Take 1 tablet (81 mg total) by mouth daily. 11/01/15   Elgergawy, Silver Huguenin, MD  atorvastatin (LIPITOR) 20 MG tablet Take 1 tablet by mouth every day 03/13/19   Binnie Rail, MD  cloNIDine (CATAPRES) 0.3 MG tablet Take 1 tablet by mouth every day 04/25/19   Binnie Rail, MD  diclofenac sodium (VOLTAREN) 1 % GEL Apply 2 g topically 4 (four) times daily as needed. 06/17/18   Mikhail, Velta Addison, DO  fluticasone (FLONASE) 50 MCG/ACT nasal spray Shake liquid & use 1 spray into each nostril every day as needed for allergies 02/23/19   Binnie Rail, MD  furosemide (LASIX) 80 MG tablet Take 1 tablet by mouth every day 04/25/19   Binnie Rail, MD   gabapentin (NEURONTIN) 600 MG tablet Take 2 tablets by mouth 3 times a day 02/23/19   Binnie Rail, MD  glipiZIDE (GLUCOTROL) 5 MG tablet Take 1 tablet (5 mg total) by mouth daily before breakfast. 04/26/19   Burns, Claudina Lick, MD  glucose blood test strip Use as instructed to check sugar three times a day 01/28/17   Binnie Rail, MD  ipratropium-albuterol (DUONEB) 0.5-2.5 (3) MG/3ML SOLN Take 3 mLs by nebulization every 4 (four) hours as needed. Patient taking differently: Take 3 mLs by nebulization every 4 (four) hours as needed (SOB).  05/26/18   Raylene Everts, MD  Lancets MISC Use as directed twice per day 01/28/17   Billey Gosling  J, MD  losartan (COZAAR) 50 MG tablet Take 1 tablet by mouth twice daily 04/25/19   Binnie Rail, MD  metoprolol (TOPROL-XL) 200 MG 24 hr tablet Take 1 tablet by mouth twice daily 04/25/19   Binnie Rail, MD  nitroGLYCERIN (NITROSTAT) 0.4 MG SL tablet Place 0.4 mg under the tongue every 5 (five) minutes as needed for chest pain.    [provider]  nystatin cream (MYCOSTATIN) Apply 1 application topically 2 (two) times daily. Apply to affected area BID for up to 7 days. 03/01/19   Salvadore Dom, MD  omeprazole (PRILOSEC) 40 MG capsule Take 1 capsule (40 mg total) by mouth 2 (two) times daily before a meal. 05/24/19   Burns, Claudina Lick, MD  oxyCODONE-acetaminophen (PERCOCET) 10-325 MG tablet Take 1 tablet by mouth every 6 (six) hours as needed for pain.  01/31/18   [provider]  OZEMPIC, 1 MG/DOSE, 2 MG/1.5ML SOPN Inject 1 mg subcutaneously once a week. 03/13/19   Binnie Rail, MD  potassium chloride SA (KLOR-CON) 20 MEQ tablet Take 1 tablet by mouth every day 04/25/19   Binnie Rail, MD  sitaGLIPtin (JANUVIA) 100 MG tablet Take 1 tablet (100 mg total) by mouth daily. 08/22/18   Binnie Rail, MD  SYMBICORT 80-4.5 MCG/ACT inhaler Inhale 2 puffs into the lungs daily as needed 03/13/19   Binnie Rail, MD  tiZANidine (ZANAFLEX) 4 MG  tablet Take 1 tablet (4 mg total) by mouth at bedtime. 02/14/19   Binnie Rail, MD  traZODone (DESYREL) 50 MG tablet Take 1 tablet (50 mg total) by mouth at bedtime as needed. for sleep 05/12/19   Binnie Rail, MD    Physical Exam: Vitals:   05/27/19 1700 05/27/19 1819 05/27/19 1914 05/27/19 2027  BP: (!) 175/106 (!) 161/104 (!) 143/97 (!) 154/94  Pulse: 73 75 73 69  Resp: 16 16 18 18   Temp:      TempSrc:      SpO2: 93% 94% 96% 95%    Constitutional: NAD, calm, comfortable, well-appearing obese female sitting upright in chair and rocking body back-and-forth while playing games on cell phone. Vitals:   05/27/19 1700 05/27/19 1819 05/27/19 1914 05/27/19 2027  BP: (!) 175/106 (!) 161/104 (!) 143/97 (!) 154/94  Pulse: 73 75 73 69  Resp: 16 16 18 18   Temp:      TempSrc:      SpO2: 93% 94% 96% 95%   Eyes: PERRL, lids and conjunctivae normal ENMT: Mucous membranes are moist.  Neck: normal, supple Respiratory: clear to auscultation bilaterally, no wheezing, no crackles. Normal respiratory effort on room air. Cardiovascular: Regular rate and rhythm, no murmurs / rubs / gallops. No extremity edema. 2+ pedal pulses.   Abdomen: Moderate tenderness with palpation of epigastric area with guarding, mild tenderness with palpation of bilateral anterior lower ribs.  Bowel sounds positive.  Musculoskeletal: no clubbing / cyanosis. No joint deformity upper and lower extremities. Good ROM, no contractures. Normal muscle tone.  Skin: no rashes, lesions, ulcers. No induration Neurologic: CN 2-12 grossly intact. Sensation intact,Strength 5/5 in all 4.  Psychiatric: Normal judgment and insight. Alert and oriented x 3. Normal mood.    Labs on Admission: I have personally reviewed following labs and imaging studies  CBC: Recent Labs  Lab 05/24/19 0936 05/27/19 1654  WBC 6.6 10.4  NEUTROABS 3.0 5.6  HGB 14.1 14.7  HCT 42.6 44.2  MCV 87.7 87.9  PLT 242.0 240  Basic Metabolic Panel: Recent  Labs  Lab 05/24/19 0936 05/27/19 1619  NA 139 138  K 4.0 4.0  CL 99 100  CO2 33* 26  GLUCOSE 144* 131*  BUN 8 10  CREATININE 0.73 0.62  CALCIUM 9.1 9.1   GFR: Estimated Creatinine Clearance: 110.7 mL/min (by C-G formula based on SCr of 0.62 mg/dL). Liver Function Tests: Recent Labs  Lab 05/24/19 0936 05/27/19 1619  AST 76* 63*  ALT 35 38  ALKPHOS 127* 118  BILITOT 0.5 0.8  PROT 8.0 8.2*  ALBUMIN 3.9 3.9   Recent Labs  Lab 05/27/19 1619  LIPASE 324*   No results for input(s): AMMONIA in the last 168 hours. Coagulation Profile: No results for input(s): INR, PROTIME in the last 168 hours. Cardiac Enzymes: No results for input(s): CKTOTAL, CKMB, CKMBINDEX, TROPONINI in the last 168 hours. BNP (last 3 results) No results for input(s): PROBNP in the last 8760 hours. HbA1C: No results for input(s): HGBA1C in the last 72 hours. CBG: No results for input(s): GLUCAP in the last 168 hours. Lipid Profile: No results for input(s): CHOL, HDL, LDLCALC, TRIG, CHOLHDL, LDLDIRECT in the last 72 hours. Thyroid Function Tests: No results for input(s): TSH, T4TOTAL, FREET4, T3FREE, THYROIDAB in the last 72 hours. Anemia Panel: No results for input(s): VITAMINB12, FOLATE, FERRITIN, TIBC, IRON, RETICCTPCT in the last 72 hours. Urine analysis:    Component Value Date/Time   COLORURINE YELLOW 05/27/2019 [place] 05/27/2019 1655   LABSPEC 1.005 05/27/2019 1655   PHURINE 7.0 05/27/2019 1655   GLUCOSEU NEGATIVE 05/27/2019 1655   GLUCOSEU >=1000 (A) 07/21/2016 1039   HGBUR NEGATIVE 05/27/2019 1655   BILIRUBINUR NEGATIVE 05/27/2019 1655   BILIRUBINUR negative 11/01/2018 1123   KETONESUR NEGATIVE 05/27/2019 1655   PROTEINUR NEGATIVE 05/27/2019 1655   UROBILINOGEN negative (A) 11/01/2018 1123   UROBILINOGEN 0.2 07/21/2016 1039   NITRITE NEGATIVE 05/27/2019 1655   LEUKOCYTESUR NEGATIVE 05/27/2019 1655    Radiological Exams on Admission: DG Chest 2 View  Result  Date: 05/27/2019 CLINICAL DATA:  SOB, nausea, abdomen pain x 1 week, hx hypertension EXAM: CHEST - 2 VIEW COMPARISON:  Chest radiograph 06/15/2018 FINDINGS: Stable cardiomediastinal contours. Chronic coarse bilateral interstitial thickening. No new focal pulmonary opacity. No pneumothorax or pleural effusion. Multilevel degenerative changes in the thoracic spine. IMPRESSION: No acute cardiopulmonary abnormality.  Chronic bronchitic change. Electronically Signed   By: Audie Pinto M.D.   On: 05/27/2019 16:39   CT ABDOMEN PELVIS W CONTRAST  Result Date: 05/27/2019 CLINICAL DATA:  Abdominal distension, nausea vomiting and upper abdominal pain EXAM: CT ABDOMEN AND PELVIS WITH CONTRAST TECHNIQUE: Multidetector CT imaging of the abdomen and pelvis was performed using the standard protocol following bolus administration of intravenous contrast. CONTRAST:  162mL OMNIPAQUE IOHEXOL 300 MG/ML  SOLN COMPARISON:  CT 06/15/2018 FINDINGS: Lower chest: Lung bases are clear. Normal heart size. No pericardial effusion. Hepatobiliary: No focal liver abnormality is seen. Patient is post cholecystectomy. Slight prominence of the biliary tree likely related to reservoir effect. No calcified intraductal gallstones. Pancreas: Mild edematous changes are noted about the pancreatic head and uncinate. No pancreatic ductal dilatation, necrotic pancreatic tissue, or peripancreatic collections. Spleen: Normal in size without focal abnormality. Adrenals/Urinary Tract: Stable 1 cm nodule in the left adrenal gland. No worrisome new or suspicious nodules. Kidneys enhance and excrete symmetrically. No visible, focal lesion, or hydronephrosis. Bladder is unremarkable. Stomach/Bowel: Distal esophagus, stomach and duodenal sweep are unremarkable. No small bowel wall thickening or dilatation. No  evidence of obstruction. The appendix is surgically absent. No colonic dilatation or wall thickening. Scattered colonic diverticula without focal  pericolonic inflammation to suggest diverticulitis. Vascular/Lymphatic: Atherosclerotic plaque within the normal caliber aorta. No suspicious or enlarged lymph nodes in the included lymphatic chains. Reproductive: Uterus is surgically absent. No concerning adnexal lesions. Other: Faint edematous changes near the pancreatic head and uncinate. Small fat containing umbilical hernia. Mild posterior body wall edema. Musculoskeletal: Multilevel degenerative changes are present in the imaged portions of the spine. No acute osseous abnormality or suspicious osseous lesion. IMPRESSION: 1. Mild edematous changes about the pancreatic head and uncinate process may represent early pancreatitis. No pancreatic ductal dilatation, necrotic pancreatic tissue or peripancreatic collections. 2. Colonic diverticulosis without evidence of diverticulitis. 3. Stable 1 cm left adrenal nodule. Stability favors benign etiology such as adenoma. 4. Aortic Atherosclerosis (ICD10-I70.0). Electronically Signed   By: Lovena Le M.D.   On: 05/27/2019 19:06    EKG: Independently reviewed.   Assessment/Plan  Acute pancreatitis Possibly idiopathic.  Patient is s/p cholecystectomy, has normal triglyceride and only endorses occasional alcohol use. She has received 1 L normal saline bolus in the ED.  We will continue IV 100 cc fluid infusion.  Rate is lower due to her history of diastolic CHF. As needed morphine for pain Clear liquid diet-advance as tolerated  Diastolic CHF Monitor for fluid overload with continuous IV fluid infusion Continue Lasix and potassium  Hypertension Continue losartan, metoprolol, clonidine  COPD Not in exacerbation Continue prn bronchodilator  Type 2 diabetes with peripheral neuropathy Hemoglobin A1c of 7.5 on 05/24/2019 BG of 131 on admit Will monitor without sliding scale for now  Chronic back pain/fibromyalgia Continue amitriptyline, as needed tizanidine Hold as needed Percocet as she has as  needed IV morphine  History of CAD Stable.  No ischemic changes on EKG.  History of CVA Continue aspirin  [facility] Reports that she rarely wears her CPAP but would like one tonight  Hyperlipidemia Continue statin  Morbid obesity BMI greater than 48  DVT prophylaxis:.Lovenox Code Status: Full Family Communication: Plan discussed with patient at bedside  disposition Plan: Home with at least 2 midnight stays  Consults called:  Admission status: inpatient  The appropriate patient status for this patient is INPATIENT. Inpatient status is judged to be reasonable and necessary in order to provide the required intensity of service to ensure the patient's safety. The patient's presenting symptoms, physical exam findings, and initial radiographic and laboratory data in the context of their chronic comorbidities is felt to place them at high risk for further clinical deterioration. Furthermore, it is not anticipated that the patient will be medically stable for discharge from the hospital within 2 midnights of admission. The following factors support the patient status of inpatient.    Patient presenting with persistent epigastric abdominal pain with findings on CT abdomen consistent with acute pancreatitis.  Patient will need to be admitted for continuous IV fluid and pain management and be evaluated daily for ability to tolerate p.o. intake.     * I certify that at the point of admission it is my clinical judgment that the patient will require inpatient hospital care spanning beyond 2 midnights from the point of admission due to high intensity of service, high risk for further deterioration and high frequency of surveillance required.Royal Piedra T Philomena Buttermore DO [facility]   If 7PM-7AM, please contact night-coverage [email or web address]   05/27/2019, 9:08 PM

## 2019-05-27 NOTE — ED Triage Notes (Signed)
Pt presents with c/o shortness of breath and abdominal pain since Monday. Pt denies any N/V/D.

## 2019-05-28 ENCOUNTER — Other Ambulatory Visit: Payer: Self-pay

## 2019-05-28 DIAGNOSIS — K859 Acute pancreatitis without necrosis or infection, unspecified: Principal | ICD-10-CM

## 2019-05-28 DIAGNOSIS — K8500 Idiopathic acute pancreatitis without necrosis or infection: Secondary | ICD-10-CM

## 2019-05-28 DIAGNOSIS — I10 Essential (primary) hypertension: Secondary | ICD-10-CM

## 2019-05-28 LAB — CBC
HCT: 39.4 % (ref 36.0–46.0)
Hemoglobin: 12.7 g/dL (ref 12.0–15.0)
MCH: 29 pg (ref 26.0–34.0)
MCHC: 32.2 g/dL (ref 30.0–36.0)
MCV: 90 fL (ref 80.0–100.0)
Platelets: 217 10*3/uL (ref 150–400)
RBC: 4.38 MIL/uL (ref 3.87–5.11)
RDW: 14.9 % (ref 11.5–15.5)
WBC: 10.5 10*3/uL (ref 4.0–10.5)
nRBC: 0 % (ref 0.0–0.2)

## 2019-05-28 LAB — BASIC METABOLIC PANEL
Anion gap: 9 (ref 5–15)
BUN: 8 mg/dL (ref 6–20)
CO2: 26 mmol/L (ref 22–32)
Calcium: 8.5 mg/dL — ABNORMAL LOW (ref 8.9–10.3)
Chloride: 103 mmol/L (ref 98–111)
Creatinine, Ser: 0.67 mg/dL (ref 0.44–1.00)
GFR calc Af Amer: >60 mL/min (ref >60)
GFR calc non Af Amer: >60 mL/min (ref >60)
Glucose, Bld: 153 mg/dL — ABNORMAL HIGH (ref 70–99)
Potassium: 3.6 mmol/L (ref 3.5–5.1)
Sodium: 138 mmol/L (ref 135–145)

## 2019-05-28 LAB — GLUCOSE, CAPILLARY
Glucose-Capillary: 158 mg/dL — ABNORMAL HIGH (ref 70–99)
Glucose-Capillary: 115 mg/dL — ABNORMAL HIGH (ref 70–99)
Glucose-Capillary: 154 mg/dL — ABNORMAL HIGH (ref 70–99)

## 2019-05-28 LAB — SARS CORONAVIRUS 2 (TAT 6-24 HRS): SARS Coronavirus 2: NEGATIVE

## 2019-05-28 MED ORDER — PANTOPRAZOLE SODIUM 40 MG PO TBEC
40.0000 mg | DELAYED_RELEASE_TABLET | Freq: Two times a day (BID) | ORAL | Status: DC
  Administered 2019-05-28 – 2019-05-29 (×2): 40 mg via ORAL
  Filled 2019-05-28 (×2): qty 1

## 2019-05-28 MED ORDER — MUPIROCIN 2 % EX OINT
TOPICAL_OINTMENT | Freq: Two times a day (BID) | CUTANEOUS | Status: DC
  Administered 2019-05-28 (×2): 1 via TOPICAL
  Filled 2019-05-28: qty 22

## 2019-05-28 MED ORDER — NEOMYCIN-POLYMYXIN B GU 40-200000 IR SOLN: Freq: Once | Status: DC

## 2019-05-28 MED ORDER — INSULIN ASPART 100 UNIT/ML ~~LOC~~ SOLN
0.0000 [IU] | Freq: Three times a day (TID) | SUBCUTANEOUS | Status: DC
  Administered 2019-05-28: 2 [IU] via SUBCUTANEOUS
  Administered 2019-05-29: 1 [IU] via SUBCUTANEOUS

## 2019-05-28 NOTE — Progress Notes (Signed)
PROGRESS NOTE    Tammy Strickland  [id number] DOB: January 27, 1961 DOA: 05/27/2019 PCP: Binnie Rail, MD    Brief Narrative:58 y.o. female with medical history significant for diastolic congestive heart failure, CAD, CVA, hypertension, COPD, OSA, type 2 diabetes with neuropathy and morbid obesity who presents with concerns of persistent epigastric abdominal pain. Symptoms started acutely about 5 days ago.  Pain is in the epigastric area and would radiate lower to bilateral anterior ribs with deep respiration.  Pain is constant and crampy in nature.  It feels better only when she sits up and walk rocks her body back and forth.  She denies any associated nausea or vomiting although she did have 1 episode of vomiting yesterday.  Pain is not initially worse with food but would hurt several hours afterwards.  She continues to have normal bowel movements and denies any diarrhea. She had called EMS last night but decided to wait it out and came in today since symptoms were not improved.  In the ER, she was afebrile and hypertensive up to systolic [id number] over [id number] on room air.  CBC was unremarkable.  Lipase of 324.  CT abdomen pelvis showed mild edematous changes about the pancreatic head and also neck process representing early pancreatitis.  Patient is s/p cholecystectomy and she endorsed only occasional alcohol use of about half a cup at a time.  Her last use was about 2 days ago which was after her symptoms have already started.  She also recently had a lipid panel done on 05/23/18/2021 and her triglycerides were noted to be normal at 113.  She denies any recent changes or new medication.  She received 1L NS, protonix 40mg , morphine 4mg  IV, Maalox in the ED.  Assessment & Plan:   Principal Problem:   Pancreatitis Active Problems:   Benign essential HTN   Fibromyalgia   Peripheral neuropathy   CAD (coronary artery disease)   COPD (chronic obstructive pulmonary disease) (HCC)   Chronic  diastolic congestive heart failure (HCC)   Diabetes (HCC)   Hyperlipidemia   Morbidly obese (HCC)   OSA (obstructive sleep apnea)  #1 acute pancreatitis of unclear etiology-patient admitted with abdominal pain and nausea and vomiting.  Found to have a lipase of 324.   CT of the abdomen and pelvis-mild edematous changes pancreatic head and uncinate process may represent early pancreatitis.  No pancreatic ductal dilatation necrotic pancreatic tissue peripancreatic collections noted.  Colonic diverticulosis without evidence of diverticulitis.  1 cm left adrenal nodule favors benign etiology. She seems to be tolerating a clear liquid diet which will be continued for today. Continue IV fluids Increase Protonix to twice a day patient is very tender in her epigastrium likely has gastritis no history of peptic ulcer disease.  #2 history of essential hypertension blood pressure 124/78.  Continue Catapres, Toprol XL Cozaar.  Hold Lasix since she is euvolemic and on IV fluids and needs IV fluids.  #3 history of COPD stable  #4 type 2 diabetes with peripheral neuropathy-continue amitriptyline CBG (last 3)  No results for input(s): GLUCAP in the last 72 hours.   #5 chronic diastolic CHF stable, patient is euvolemic currently Lasix on hold.  #6 history of chronic pain followed by pain clinic for fibromyalgia continue home meds  #7 history of CAD continue aspirin  #8 history of stroke on aspirin  #9 history of obstructive sleep apnea on CPAP at home she does not use CPAP on a regular basis  #10 hyperlipidemia on statin  #11  morbid obesity BMI greater 48.58  #12 status post biopsy of the left breast-recent-the punch biopsy site in the left upper breast looks clean with no drainage.   Estimated body mass index is 48.58 kg/m as calculated from the following:   Height as of this encounter: 5\' 6"  (1.676 m).   Weight as of this encounter: 136.5 kg.  DVT prophylaxis: lovenox Code  Status:full Family Communication: none Disposition Plan:. Patient came from home Plan is to discharge her back home when she is medically ready Barriers to discharge ongoing abdominal pain unable to tolerate p.o. intake admitted with acute pancreatitis  Consultants: None   Procedures none Antimicrobials: None Subjective:  Patient resting in bed complains of epigastric pain no nausea vomiting had a normal large BM today Objective: Vitals:   05/27/19 2027 05/27/19 2135 05/28/19 0546 05/28/19 0627  BP: (!) 154/94 (!) 154/87 124/78   Pulse: 69 72 72   Resp: 18 16 (!) 22   Temp:   98.5 F (36.9 C)   TempSrc:   Oral   SpO2: 95% 96% 100%   Weight:    (!) 136.5 kg  Height:    5\' 6"  (1.676 m)    Intake/Output Summary (Last 24 hours) at 05/28/2019 1131 Last data filed at 05/28/2019 0600 Gross per 24 hour  Intake 1652.75 ml  Output --  Net 1652.75 ml   Filed Weights   05/28/19 0627  Weight: (!) 136.5 kg    Examination:  General exam: Appears calm and comfortable  Respiratory system: Clear to auscultation. Respiratory effort normal. Cardiovascular system: S1 & S2 heard, RRR. No JVD, murmurs, rubs, gallops or clicks. No pedal edema. Gastrointestinal system: Abdomen is nondistended, soft and epigastric tenderness. No organomegaly or masses felt. Normal bowel sounds heard. Central nervous system: Alert and oriented. No focal neurological deficits. Extremities: Symmetric 5 x 5 power. Skin: 3 biopsies from the left breast left upper area with looks like a punch biopsy site looks clean and dry with no evidence of drainage or infection psychiatry: Judgement and insight appear normal. Mood & affect appropriate.     Data Reviewed: I have personally reviewed following labs and imaging studies  CBC: Recent Labs  Lab 05/24/19 0936 05/27/19 1654 05/28/19 0333  WBC 6.6 10.4 10.5  NEUTROABS 3.0 5.6  --   HGB 14.1 14.7 12.7  HCT 42.6 44.2 39.4  MCV 87.7 87.9 90.0  PLT 242.0 240  [id number]   Basic Metabolic Panel: Recent Labs  Lab 05/24/19 0936 05/27/19 1619 05/28/19 0333  NA 139 138 138  K 4.0 4.0 3.6  CL 99 100 103  CO2 33* 26 26  GLUCOSE 144* 131* 153*  BUN 8 10 8   CREATININE 0.73 0.62 0.67  CALCIUM 9.1 9.1 8.5*   GFR: Estimated Creatinine Clearance: 110.5 mL/min (by C-G formula based on SCr of 0.67 mg/dL). Liver Function Tests: Recent Labs  Lab 05/24/19 0936 05/27/19 1619  AST 76* 63*  ALT 35 38  ALKPHOS 127* 118  BILITOT 0.5 0.8  PROT 8.0 8.2*  ALBUMIN 3.9 3.9   Recent Labs  Lab 05/27/19 1619  LIPASE 324*   No results for input(s): AMMONIA in the last 168 hours. Coagulation Profile: No results for input(s): INR, PROTIME in the last 168 hours. Cardiac Enzymes: No results for input(s): CKTOTAL, CKMB, CKMBINDEX, TROPONINI in the last 168 hours. BNP (last 3 results) No results for input(s): PROBNP in the last 8760 hours. HbA1C: No results for input(s): HGBA1C in the last 72 hours.  CBG: No results for input(s): GLUCAP in the last 168 hours. Lipid Profile: No results for input(s): CHOL, HDL, LDLCALC, TRIG, CHOLHDL, LDLDIRECT in the last 72 hours. Thyroid Function Tests: No results for input(s): TSH, T4TOTAL, FREET4, T3FREE, THYROIDAB in the last 72 hours. Anemia Panel: No results for input(s): VITAMINB12, FOLATE, FERRITIN, TIBC, IRON, RETICCTPCT in the last 72 hours. Sepsis Labs: No results for input(s): PROCALCITON, LATICACIDVEN in the last 168 hours.  Recent Results (from the past 240 hour(s))  SARS CORONAVIRUS 2 (TAT 6-24 HRS) Nasopharyngeal Nasopharyngeal Swab     Status: None   Collection Time: 05/27/19  6:25 PM   Specimen: Nasopharyngeal Swab  Result Value Ref Range Status   SARS Coronavirus 2 NEGATIVE NEGATIVE Final    Comment: (NOTE) SARS-CoV-2 target nucleic acids are NOT DETECTED. The SARS-CoV-2 RNA is generally detectable in upper and lower respiratory specimens during the acute phase of infection. Negative results do not  preclude SARS-CoV-2 infection, do not rule out co-infections with other pathogens, and should not be used as the sole basis for treatment or other patient management decisions. Negative results must be combined with clinical observations, patient history, and epidemiological information. The expected result is Negative. Fact Sheet for Patients: SugarRoll[employer]be Fact Sheet for Healthcare Providers: https://www.[facility] [employer].com/ This test is not yet approved or cleared by the [place] FDA and  has been authorized for detection and/or diagnosis of SARS-CoV-2 by FDA under an Emergency Use Authorization (EUA). This EUA will remain  in effect (meaning this test can be used) for the duration of the COVID-19 declaration under Section 56 4(b)(1) of the Act, 21 U.S.C. section 360bbb-3(b)(1), unless the authorization is terminated or revoked sooner. Performed at [facility], [place] [address], [place], [place] [address]          Radiology Studies: DG Chest 2 View  Result Date: 05/27/2019 CLINICAL DATA:  SOB, nausea, abdomen pain x 1 week, hx hypertension EXAM: CHEST - 2 VIEW COMPARISON:  Chest radiograph 06/15/2018 FINDINGS: Stable cardiomediastinal contours. Chronic coarse bilateral interstitial thickening. No new focal pulmonary opacity. No pneumothorax or pleural effusion. Multilevel degenerative changes in the thoracic spine. IMPRESSION: No acute cardiopulmonary abnormality.  Chronic bronchitic change. Electronically Signed   By: Audie Pinto M.D.   On: 05/27/2019 16:39   CT ABDOMEN PELVIS W CONTRAST  Result Date: 05/27/2019 CLINICAL DATA:  Abdominal distension, nausea vomiting and upper abdominal pain EXAM: CT ABDOMEN AND PELVIS WITH CONTRAST TECHNIQUE: Multidetector CT imaging of the abdomen and pelvis was performed using the standard protocol following bolus administration of intravenous contrast. CONTRAST:  189mL OMNIPAQUE IOHEXOL 300  MG/ML  SOLN COMPARISON:  CT 06/15/2018 FINDINGS: Lower chest: Lung bases are clear. Normal heart size. No pericardial effusion. Hepatobiliary: No focal liver abnormality is seen. Patient is post cholecystectomy. Slight prominence of the biliary tree likely related to reservoir effect. No calcified intraductal gallstones. Pancreas: Mild edematous changes are noted about the pancreatic head and uncinate. No pancreatic ductal dilatation, necrotic pancreatic tissue, or peripancreatic collections. Spleen: Normal in size without focal abnormality. Adrenals/Urinary Tract: Stable 1 cm nodule in the left adrenal gland. No worrisome new or suspicious nodules. Kidneys enhance and excrete symmetrically. No visible, focal lesion, or hydronephrosis. Bladder is unremarkable. Stomach/Bowel: Distal esophagus, stomach and duodenal sweep are unremarkable. No small bowel wall thickening or dilatation. No evidence of obstruction. The appendix is surgically absent. No colonic dilatation or wall thickening. Scattered colonic diverticula without focal pericolonic inflammation to suggest diverticulitis. Vascular/Lymphatic: Atherosclerotic plaque within the  normal caliber aorta. No suspicious or enlarged lymph nodes in the included lymphatic chains. Reproductive: Uterus is surgically absent. No concerning adnexal lesions. Other: Faint edematous changes near the pancreatic head and uncinate. Small fat containing umbilical hernia. Mild posterior body wall edema. Musculoskeletal: Multilevel degenerative changes are present in the imaged portions of the spine. No acute osseous abnormality or suspicious osseous lesion. IMPRESSION: 1. Mild edematous changes about the pancreatic head and uncinate process may represent early pancreatitis. No pancreatic ductal dilatation, necrotic pancreatic tissue or peripancreatic collections. 2. Colonic diverticulosis without evidence of diverticulitis. 3. Stable 1 cm left adrenal nodule. Stability favors benign  etiology such as adenoma. 4. Aortic Atherosclerosis (ICD10-I70.0). Electronically Signed   By: Lovena Le M.D.   On: 05/27/2019 19:06        Scheduled Meds: . amitriptyline  75 mg Oral QHS  . aspirin EC  81 mg Oral Daily  . atorvastatin  20 mg Oral q1800  . cloNIDine  0.3 mg Oral Daily  . enoxaparin (LOVENOX) injection  60 mg Subcutaneous Q24H  . furosemide  80 mg Oral Daily  . gabapentin  600 mg Oral BID  . losartan  50 mg Oral Daily  . metoprolol  200 mg Oral BID  . mometasone-formoterol  2 puff Inhalation BID  . mupirocin ointment   Topical BID  . pantoprazole  40 mg Oral Daily  . potassium chloride SA  20 mEq Oral Daily  . sodium chloride flush  3 mL Intravenous Once  . tiZANidine  4 mg Oral QHS   Continuous Infusions: . sodium chloride 100 mL/hr at 05/28/19 0600     LOS: 1 day     Georgette Shell, MD  05/28/2019, 11:31 AM

## 2019-05-28 NOTE — Progress Notes (Signed)
Pt declined a cpap machine for tonight.

## 2019-05-29 LAB — GLUCOSE, CAPILLARY: Glucose-Capillary: 127 mg/dL — ABNORMAL HIGH (ref 70–99)

## 2019-05-29 MED ORDER — MUPIROCIN 2 % EX OINT: TOPICAL_OINTMENT | Freq: Two times a day (BID) | CUTANEOUS | 0 refills | Status: DC

## 2019-05-29 NOTE — Discharge Summary (Signed)
Physician Discharge Summary  Tammy Strickland [id number] DOB: [birth year] DOA: 05/27/2019  PCP: Binnie Rail, MD  Admit date: 05/27/2019 Discharge date: 05/29/2019  Admitted From: home Disposition:  Home Recommendations for Outpatient Follow-up:  1. Follow up with PCP in 1-2 weeks 2. Please obtain BMP/CBC in one week   Home Health:none Equipment/Devices:none Discharge Condition stable  CODE STATUS:full Diet recommendation: cardiac Brief/Interim Summary:59 y.o.femalewith medical history significant fordiastolic congestive heart failure, CAD, CVA, hypertension, COPD, OSA, type 2 diabetes with neuropathy and morbid obesity who presents with concerns of persistent epigastric abdominal pain. Symptoms started acutely about 5 days ago. Pain is in the epigastric area and would radiate lower to bilateralanterior ribs with deep respiration. Pain is constant and crampy in nature. It feels better only when she sits up and walk rocks her bodyback and forth. She denies any associated nausea orvomiting although she did have 1 episode of vomiting yesterday. Pain is not initially worse with food but would hurt several hours afterwards. She continues to have normal bowel movements and denies any diarrhea. She had called EMS last night but decided to wait it out and came in today since symptoms were not improved.  In the ER, she was afebrile and hypertensive up to systolic [id number] over [id number] on room air.  CBC was unremarkable. Lipase of 324.  CT abdomen pelvis showed mild edematous changes about the pancreatic head and also neck process representing early pancreatitis.  Patient is s/p cholecystectomyand she endorsed only occasional alcohol use of about half a cup at a time. Her last use was about 2 days ago which was after her symptoms have already started. She also recently had a lipid panel done on 05/23/18/2021 and her triglycerides were noted to be normal at 113. She denies any recent  changes or new medication.  She received 1L NS, protonix 40mg , morphine 4mg  IV, Maalox in the ED.   Discharge Diagnoses:  Principal Problem:   Pancreatitis Active Problems:   Benign essential HTN   Fibromyalgia   Peripheral neuropathy   CAD (coronary artery disease)   COPD (chronic obstructive pulmonary disease) (HCC)   Chronic diastolic congestive heart failure (HCC)   Diabetes (HCC)   Hyperlipidemia   Morbidly obese (HCC)   OSA (obstructive sleep apnea)    #1 acute pancreatitis of unclear etiology-patient admitted with abdominal pain and nausea and vomiting.  Found to have a lipase of 324.   CT of the abdomen and pelvis-mild edematous changes pancreatic head and uncinate process may represent early pancreatitis.  No pancreatic ductal dilatation necrotic pancreatic tissue peripancreatic collections noted.  Colonic diverticulosis without evidence of diverticulitis.  1 cm left adrenal nodule favors benign etiology. She tolerated regular diet prior to discharge.    #2 history of essential hypertension blood pressure 124/78.  Continue Catapres, Toprol XL Cozaar.    #3 history of COPD stable  #4 type 2 diabetes with peripheral neuropathy-continue amitriptyline CBG (last 3)  Recent Labs (last 2 labs)   No results for input(s): GLUCAP in the last 72 hours.     #5 chronic diastolic CHF stable   #6 history of chronic pain followed by pain clinic for fibromyalgia continue home meds  #7 history of CAD continue aspirin  #8 history of stroke on aspirin  #9 history of obstructive sleep apnea on CPAP at home she does not use CPAP on a regular basis  #10 hyperlipidemia on statin  #11 morbid obesity BMI greater 48.58  #12 status post biopsy of the left  breast-recent-the punch biopsy site in the left upper breast looks clean with no drainage.  Estimated body mass index is 48.58 kg/m as calculated from the following:   Height as of this encounter: 5\' 6"  (1.676 m).    Weight as of this encounter: 136.5 kg.  Discharge Instructions  Discharge Instructions    Call MD for:  difficulty breathing, headache or visual disturbances   Complete by: As directed    Call MD for:  persistant dizziness or light-headedness   Complete by: As directed    Call MD for:  persistant nausea and vomiting   Complete by: As directed    Call MD for:  severe uncontrolled pain   Complete by: As directed    Diet - low sodium heart healthy   Complete by: As directed    Increase activity slowly   Complete by: As directed      Allergies as of 05/29/2019      Reactions   Metformin And Related Other (See Comments)   Lactic acidosis   Bacitracin Hives      Medication List    STOP taking these medications   diclofenac sodium 1 % Gel Commonly known as: VOLTAREN   ipratropium-albuterol 0.5-2.5 (3) MG/3ML Soln Commonly known as: DUONEB   nystatin cream Commonly known as: MYCOSTATIN   sitaGLIPtin 100 MG tablet Commonly known as: Januvia     TAKE these medications   amitriptyline 75 MG tablet Commonly known as: ELAVIL Take 1 tablet (75 mg total) by mouth at bedtime.   aspirin EC 81 MG tablet Take 1 tablet (81 mg total) by mouth daily.   atorvastatin 20 MG tablet Commonly known as: LIPITOR Take 1 tablet by mouth every day   cloNIDine 0.3 MG tablet Commonly known as: CATAPRES Take 1 tablet by mouth every day   fluticasone 50 MCG/ACT nasal spray Commonly known as: FLONASE Shake liquid & use 1 spray into each nostril every day as needed for allergies What changed: See the new instructions.   furosemide 80 MG tablet Commonly known as: LASIX Take 1 tablet by mouth every day   gabapentin 600 MG tablet Commonly known as: NEURONTIN Take 2 tablets by mouth 3 times a day What changed:   how much to take  when to take this   glipiZIDE 5 MG tablet Commonly known as: GLUCOTROL Take 1 tablet (5 mg total) by mouth daily before breakfast.   glucose blood test  strip Use as instructed to check sugar three times a day   Lancets Misc Use as directed twice per day   losartan 50 MG tablet Commonly known as: COZAAR Take 1 tablet by mouth twice daily What changed: when to take this   metoprolol 200 MG 24 hr tablet Commonly known as: TOPROL-XL Take 1 tablet by mouth twice daily   mupirocin ointment 2 % Commonly known as: BACTROBAN Apply topically 2 (two) times daily.   nitroGLYCERIN 0.4 MG SL tablet Commonly known as: NITROSTAT Place 0.4 mg under the tongue every 5 (five) minutes as needed for chest pain.   omeprazole 40 MG capsule Commonly known as: PRILOSEC Take 1 capsule (40 mg total) by mouth 2 (two) times daily before a meal.   oxyCODONE-acetaminophen 10-325 MG tablet Commonly known as: PERCOCET Take 1 tablet by mouth every 6 (six) hours as needed for pain.   Ozempic (1 MG/DOSE) 2 MG/1.5ML Sopn Generic drug: Semaglutide (1 MG/DOSE) Inject 1 mg subcutaneously once a week. What changed: See the new instructions.  potassium chloride SA 20 MEQ tablet Commonly known as: KLOR-CON Take 1 tablet by mouth every day   Symbicort 80-4.5 MCG/ACT inhaler Generic drug: budesonide-formoterol Inhale 2 puffs into the lungs daily as needed What changed: See the new instructions.   tiZANidine 4 MG tablet Commonly known as: ZANAFLEX Take 1 tablet (4 mg total) by mouth at bedtime. What changed: how much to take   traZODone 50 MG tablet Commonly known as: DESYREL Take 1 tablet (50 mg total) by mouth at bedtime as needed. for sleep What changed: reasons to take this      Follow-up Information    Burns, Claudina Lick, MD Follow up.   Specialty: Internal Medicine Contact information: [place] [id number] [phone]          Allergies  Allergen Reactions  . Metformin And Related Other (See Comments)    Lactic acidosis  . Bacitracin Hives    Consultations: none  Procedures/Studies: DG Chest 2 View  Result  Date: 05/27/2019 CLINICAL DATA:  SOB, nausea, abdomen pain x 1 week, hx hypertension EXAM: CHEST - 2 VIEW COMPARISON:  Chest radiograph 06/15/2018 FINDINGS: Stable cardiomediastinal contours. Chronic coarse bilateral interstitial thickening. No new focal pulmonary opacity. No pneumothorax or pleural effusion. Multilevel degenerative changes in the thoracic spine. IMPRESSION: No acute cardiopulmonary abnormality.  Chronic bronchitic change. Electronically Signed   By: Audie Pinto M.D.   On: 05/27/2019 16:39   CT ABDOMEN PELVIS W CONTRAST  Result Date: 05/27/2019 CLINICAL DATA:  Abdominal distension, nausea vomiting and upper abdominal pain EXAM: CT ABDOMEN AND PELVIS WITH CONTRAST TECHNIQUE: Multidetector CT imaging of the abdomen and pelvis was performed using the standard protocol following bolus administration of intravenous contrast. CONTRAST:  136mL OMNIPAQUE IOHEXOL 300 MG/ML  SOLN COMPARISON:  CT 06/15/2018 FINDINGS: Lower chest: Lung bases are clear. Normal heart size. No pericardial effusion. Hepatobiliary: No focal liver abnormality is seen. Patient is post cholecystectomy. Slight prominence of the biliary tree likely related to reservoir effect. No calcified intraductal gallstones. Pancreas: Mild edematous changes are noted about the pancreatic head and uncinate. No pancreatic ductal dilatation, necrotic pancreatic tissue, or peripancreatic collections. Spleen: Normal in size without focal abnormality. Adrenals/Urinary Tract: Stable 1 cm nodule in the left adrenal gland. No worrisome new or suspicious nodules. Kidneys enhance and excrete symmetrically. No visible, focal lesion, or hydronephrosis. Bladder is unremarkable. Stomach/Bowel: Distal esophagus, stomach and duodenal sweep are unremarkable. No small bowel wall thickening or dilatation. No evidence of obstruction. The appendix is surgically absent. No colonic dilatation or wall thickening. Scattered colonic diverticula without focal  pericolonic inflammation to suggest diverticulitis. Vascular/Lymphatic: Atherosclerotic plaque within the normal caliber aorta. No suspicious or enlarged lymph nodes in the included lymphatic chains. Reproductive: Uterus is surgically absent. No concerning adnexal lesions. Other: Faint edematous changes near the pancreatic head and uncinate. Small fat containing umbilical hernia. Mild posterior body wall edema. Musculoskeletal: Multilevel degenerative changes are present in the imaged portions of the spine. No acute osseous abnormality or suspicious osseous lesion. IMPRESSION: 1. Mild edematous changes about the pancreatic head and uncinate process may represent early pancreatitis. No pancreatic ductal dilatation, necrotic pancreatic tissue or peripancreatic collections. 2. Colonic diverticulosis without evidence of diverticulitis. 3. Stable 1 cm left adrenal nodule. Stability favors benign etiology such as adenoma. 4. Aortic Atherosclerosis (ICD10-I70.0). Electronically Signed   By: Lovena Le M.D.   On: 05/27/2019 19:06   MM Digital Diagnostic Unilat L  Result Date: 05/02/2019 CLINICAL DATA:  Patient recalled from  screening for left breast calcifications. EXAM: DIGITAL DIAGNOSTIC LEFT MAMMOGRAM WITH CAD COMPARISON:  Previous exam(s). ACR Breast Density Category b: There are scattered areas of fibroglandular density. FINDINGS: Magnification cc and true lateral views of the left breast were obtained. Within the central aspect of the left breast posterior depth slightly inferiorly there are two adjacent groups of coarse heterogeneous calcifications measuring 7 mm and 8 mm respectively. Mammographic images were processed with CAD. IMPRESSION: There are two indeterminate adjacent groups of calcifications within the left breast. RECOMMENDATION: Stereotactic guided core needle biopsy of both groups of indeterminate left breast calcifications. I have discussed the findings and recommendations with the patient. If  applicable, a reminder letter will be sent to the patient regarding the next appointment. BI-RADS CATEGORY  4: Suspicious. Electronically Signed   By: Lovey Newcomer M.D.   On: 05/02/2019 07:58   MM CLIP PLACEMENT LEFT  Result Date: 05/18/2019 CLINICAL DATA:  Status post stereotactic guided core biopsy of calcifications in the UPPER INNER QUADRANT LEFT breast. Recent stereotactic guided core biopsies of calcifications so complex sclerosing lesion at both sites. EXAM: DIAGNOSTIC LEFT MAMMOGRAM POST ULTRASOUND BIOPSY COMPARISON:  Previous exam(s). FINDINGS: Mammographic images were obtained following stereotactic guided biopsy of calcifications in the UPPER INNER QUADRANT LEFT breast and placement of a ribbon shaped clip. The biopsy marking clip is in expected position at the site of biopsy. The ribbon clip is 5.1 centimeters from the X clip. The ribbon clip is 5.7 centimeters from the coil clip. IMPRESSION: Appropriate positioning of the ribbon shaped biopsy marking clip at the site of biopsy in the UPPER INNER QUADRANT LEFT breast. Final Assessment: Post Procedure Mammograms for Marker Placement Electronically Signed   By: Nolon Nations M.D.   On: 05/18/2019 10:37   MM CLIP PLACEMENT LEFT  Result Date: 05/09/2019 CLINICAL DATA:  Patient status post stereotactic guided core needle biopsy left breast calcifications, 2 sites. EXAM: DIAGNOSTIC LEFT MAMMOGRAM POST STEREOTACTIC BIOPSY COMPARISON:  Previous exam(s). FINDINGS: Mammographic images were obtained following stereotactic guided biopsy of left breast calcifications 2 sites. Site 1: Inferior posterior left breast: X shaped clip: X shaped marking clip located approximately 3.2 cm medial to the site of biopsied calcifications. A small amount of residual calcifications are present at the site. Site 2: Superior central left breast: Coil shaped clip: In appropriate position. Note there is a third area of calcifications which are identified on the original  diagnostic on the magnification ML view. These are located medially on the cc clip film. IMPRESSION: Approximate 3.2 cm medial migration of the X shaped marking clip relative to the site of biopsied calcifications within the inferior posterior left breast. Appropriate position coil shaped marking clip at the site of biopsied calcifications superior central left breast. Final Assessment: Post Procedure Mammograms for Marker Placement Electronically Signed   By: Lovey Newcomer M.D.   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 This was found to be concordant by Dr. Nolon Nations. Pathology results were discussed with the patient by telephone. The patient reported doing well after the biopsy with tenderness at the site. Post biopsy instructions and care were reviewed and questions were answered. The patient was encouraged to call [facility] for any additional concerns. The patient has biopsy  proven COMPLEX SCLEROSING LESION WITH CALCIFICATIONS of the LEFT breast, inferior posterior (x clip), and COMPLEX SCLEROSING LESION WITH CALCIFICATIONS of the LEFT breast, superior central (coil clip), with excision recommended of both areas. Surgical consultation has been arranged with Dr. Nedra Hai at [facility] on May 23, 2018. Pathology results reported by Terie Purser, RN on 05/19/2019. Electronically Signed   By: Nolon Nations M.D.   On: 05/19/2019 14:28   Result Date: 05/19/2019 CLINICAL DATA:  Patient presents for stereotactic guided core biopsy of a third site calcifications in the LEFT breast. EXAM: LEFT BREAST STEREOTACTIC CORE NEEDLE BIOPSY COMPARISON:  Previous exams. FINDINGS: The patient and I discussed the  procedure of stereotactic-guided biopsy including benefits and alternatives. We discussed the high likelihood of a successful procedure. We discussed the risks of the procedure including infection, bleeding, tissue injury, clip migration, and inadequate sampling. Informed written consent was given. The usual time out protocol was performed immediately prior to the procedure. Using sterile technique and 1% lidocaine and 2% lidocaine with epinephrine as local anesthetic, under stereotactic guidance, a 9 gauge vacuum assisted device was used to perform core needle biopsy of calcifications in the UPPER INNER QUADRANT of the LEFT breast using a MEDIAL to LATERAL approach. Specimen radiograph was performed showing calcifications and numerous samples. Specimens with calcifications are identified for pathology. Lesion quadrant: UPPER INNER QUADRANT LEFT breast At the conclusion of the procedure, ribbon shaped tissue marker clip was deployed into the biopsy cavity. Follow-up 2-view mammogram was performed and dictated separately. IMPRESSION: Stereotactic-guided biopsy of LEFT breast calcifications. No apparent complications. Electronically Signed: By: Nolon Nations M.D. On: 05/18/2019 10:36   MM LT BREAST BX W LOC DEV 1ST LESION IMAGE BX SPEC STEREO GUIDE  Addendum Date: 05/10/2019   ADDENDUM REPORT: 05/10/2019 14:33 ADDENDUM: Pathology revealed COMPLEX SCLEROSING LESION WITH CALCIFICATIONS of the LEFT breast, inferior posterior (x clip). This was found to be concordant by Dr. Lovey Newcomer, with excision recommended. Pathology revealed COMPLEX SCLEROSING LESION WITH CALCIFICATIONS of the LEFT breast, superior central (coil clip). This was found to be concordant by Dr. Lovey Newcomer, with excision recommended. Pathology results were discussed with the patient by telephone. The patient reported doing well after the biopsies with tenderness at the sites. Post biopsy instructions and care were reviewed and questions were  answered. The patient was encouraged to call [facility] for any additional concerns. The patient is scheduled for biopsy of 3rd area LEFT breast calcifications on May 18, 2019. Surgical consultation has been arranged with Dr. Coralie Keens at [facility] on May 24, 2019. Pathology results reported by Stacie Acres RN on 05/10/2019. Electronically Signed   By: Lovey Newcomer M.D.   On: 05/10/2019 14:33   Result Date: 05/10/2019 CLINICAL DATA:  Patient with indeterminate left breast calcifications. EXAM: LEFT BREAST STEREOTACTIC CORE NEEDLE BIOPSY COMPARISON:  Previous exams. FINDINGS: The patient and I discussed the procedure of stereotactic-guided biopsy including benefits and alternatives. We discussed the high likelihood of a successful procedure. We discussed the risks of the procedure including infection, bleeding, tissue injury, clip migration, and inadequate sampling. Informed written consent was given. The usual time out protocol was performed immediately prior to the procedure. Note additional imaging was performed prior to the start of the biopsy as the originally evaluated group of calcifications centrally within the left breast on the true lateral view did not correspond with the superiorly located calcifications on the cc view. Multiple additional CC images  were obtained in this group of calcifications was unable to be identified on the magnification cc views. This group of calcification however was identified on the full paddle cc views and are located medial. Site 1: Left breast inferior posterior: X shaped clip. Using sterile technique and 1% Lidocaine as local anesthetic, under stereotactic guidance, a 9 gauge vacuum assisted device was used to perform core needle biopsy of calcifications within the inferior posterior left breast using a lateral approach. Specimen radiograph was performed showing calcifications. Specimens with calcifications are  identified for pathology. Lesion quadrant: Lower inner quadrant At the conclusion of the procedure, X shaped tissue marker clip was deployed into the biopsy cavity. Follow-up 2-view mammogram was performed and dictated separately. Site 2: Left breast superior central: Coil shaped clip: Using sterile technique and 1% Lidocaine as local anesthetic, under stereotactic guidance, a 9 gauge vacuum assisted device was used to perform core needle biopsy of calcifications within the left breast superior central using a cranial approach. Specimen radiograph was performed showing calcifications. Specimens with calcifications are identified for pathology. Lesion quadrant: Upper inner quadrant At the conclusion of the procedure, coil shaped tissue marker clip was deployed into the biopsy cavity. Follow-up 2-view mammogram was performed and dictated separately. IMPRESSION: Stereotactic-guided biopsy of left breast calcifications, 2 sites. No apparent complications. Note there is a third group of calcifications which were identified predominately on the magnification ML view on original diagnostic examination. The calcifications were not able to be evaluated on magnification cc view however are apparent on the full paddle post clip cc image and are located medially. If both of these biopsies are benign, recommend six-month follow-up of these probably benign left breast calcifications. Electronically Signed: By: Lovey Newcomer M.D. On: 05/09/2019 11:53   MM LT BREAST BX W LOC DEV EA AD LESION IMG BX SPEC STEREO GUIDE  Addendum Date: 05/10/2019   ADDENDUM REPORT: 05/10/2019 14:33 ADDENDUM: Pathology revealed COMPLEX SCLEROSING LESION WITH CALCIFICATIONS of the LEFT breast, inferior posterior (x clip). This was found to be concordant by Dr. Lovey Newcomer, with excision recommended. Pathology revealed COMPLEX SCLEROSING LESION WITH CALCIFICATIONS of the LEFT breast, superior central (coil clip). This was found to be concordant by Dr.  Lovey Newcomer, with excision recommended. Pathology results were discussed with the patient by telephone. The patient reported doing well after the biopsies with tenderness at the sites. Post biopsy instructions and care were reviewed and questions were answered. The patient was encouraged to call [facility] for any additional concerns. The patient is scheduled for biopsy of 3rd area LEFT breast calcifications on May 18, 2019. Surgical consultation has been arranged with Dr. Coralie Keens at [facility] on May 24, 2019. Pathology results reported by Stacie Acres RN on 05/10/2019. Electronically Signed   By: Lovey Newcomer M.D.   On: 05/10/2019 14:33   Result Date: 05/10/2019 CLINICAL DATA:  Patient with indeterminate left breast calcifications. EXAM: LEFT BREAST STEREOTACTIC CORE NEEDLE BIOPSY COMPARISON:  Previous exams. FINDINGS: The patient and I discussed the procedure of stereotactic-guided biopsy including benefits and alternatives. We discussed the high likelihood of a successful procedure. We discussed the risks of the procedure including infection, bleeding, tissue injury, clip migration, and inadequate sampling. Informed written consent was given. The usual time out protocol was performed immediately prior to the procedure. Note additional imaging was performed prior to the start of the biopsy as the originally evaluated group of calcifications centrally within the left breast on the true  lateral view did not correspond with the superiorly located calcifications on the cc view. Multiple additional CC images were obtained in this group of calcifications was unable to be identified on the magnification cc views. This group of calcification however was identified on the full paddle cc views and are located medial. Site 1: Left breast inferior posterior: X shaped clip. Using sterile technique and 1% Lidocaine as local anesthetic, under stereotactic guidance, a 9  gauge vacuum assisted device was used to perform core needle biopsy of calcifications within the inferior posterior left breast using a lateral approach. Specimen radiograph was performed showing calcifications. Specimens with calcifications are identified for pathology. Lesion quadrant: Lower inner quadrant At the conclusion of the procedure, X shaped tissue marker clip was deployed into the biopsy cavity. Follow-up 2-view mammogram was performed and dictated separately. Site 2: Left breast superior central: Coil shaped clip: Using sterile technique and 1% Lidocaine as local anesthetic, under stereotactic guidance, a 9 gauge vacuum assisted device was used to perform core needle biopsy of calcifications within the left breast superior central using a cranial approach. Specimen radiograph was performed showing calcifications. Specimens with calcifications are identified for pathology. Lesion quadrant: Upper inner quadrant At the conclusion of the procedure, coil shaped tissue marker clip was deployed into the biopsy cavity. Follow-up 2-view mammogram was performed and dictated separately. IMPRESSION: Stereotactic-guided biopsy of left breast calcifications, 2 sites. No apparent complications. Note there is a third group of calcifications which were identified predominately on the magnification ML view on original diagnostic examination. The calcifications were not able to be evaluated on magnification cc view however are apparent on the full paddle post clip cc image and are located medially. If both of these biopsies are benign, recommend six-month follow-up of these probably benign left breast calcifications. Electronically Signed: By: Lovey Newcomer M.D. On: 05/09/2019 11:53    (Echo, Carotid, EGD, Colonoscopy, ERCP)    Subjective:  Feels well anxious to go home had a regular breakfast without any nausea vomiting Discharge Exam: Vitals:   05/28/19 2221 05/29/19 0529  BP: 121/74 135/87  Pulse: 66 60   Resp: 18 18  Temp: 98.9 F (37.2 C) 98.6 F (37 C)  SpO2: 98% 94%   Vitals:   05/28/19 0627 05/28/19 1325 05/28/19 2221 05/29/19 0529  BP:  123/72 121/74 135/87  Pulse:  67 66 60  Resp:  19 18 18   Temp:  98.8 F (37.1 C) 98.9 F (37.2 C) 98.6 F (37 C)  TempSrc:  Oral Oral Oral  SpO2:  100% 98% 94%  Weight: (!) 136.5 kg     Height: 5\' 6"  (1.676 m)       General: Pt is alert, awake, not in acute distress Cardiovascular: RRR, S1/S2 +, no rubs, no gallops Respiratory: CTA bilaterally, no wheezing, no rhonchi Abdominal: Soft, NT, ND, bowel sounds + Extremities: no edema, no cyanosis    The results of significant diagnostics from this hospitalization (including imaging, microbiology, ancillary and laboratory) are listed below for reference.     Microbiology: Recent Results (from the past 240 hour(s))  SARS CORONAVIRUS 2 (TAT 6-24 HRS) Nasopharyngeal Nasopharyngeal Swab     Status: None   Collection Time: 05/27/19  6:25 PM   Specimen: Nasopharyngeal Swab  Result Value Ref Range Status   SARS Coronavirus 2 NEGATIVE NEGATIVE Final    Comment: (NOTE) SARS-CoV-2 target nucleic acids are NOT DETECTED. The SARS-CoV-2 RNA is generally detectable in upper and lower respiratory specimens during the acute phase  of infection. Negative results do not preclude SARS-CoV-2 infection, do not rule out co-infections with other pathogens, and should not be used as the sole basis for treatment or other patient management decisions. Negative results must be combined with clinical observations, patient history, and epidemiological information. The expected result is Negative. Fact Sheet for Patients: SugarRoll.be Fact Sheet for Healthcare Providers: https://www.woods-Corinthian Mizrahi.com/ This test is not yet approved or cleared by the [place] FDA and  has been authorized for detection and/or diagnosis of SARS-CoV-2 by FDA under an Emergency Use  Authorization (EUA). This EUA will remain  in effect (meaning this test can be used) for the duration of the COVID-19 declaration under Section 56 4(b)(1) of the Act, 21 U.S.C. section 360bbb-3(b)(1), unless the authorization is terminated or revoked sooner. Performed at [facility], [place] [address] [place], [place] [address]      Labs: BNP (last 3 results) Recent Labs    06/15/18 0650  BNP [id number]   Basic Metabolic Panel: Recent Labs  Lab 05/24/19 0936 05/27/19 1619 05/28/19 0333  NA 139 138 138  K 4.0 4.0 3.6  CL 99 100 103  CO2 33* 26 26  GLUCOSE 144* 131* 153*  BUN 8 10 8   CREATININE 0.73 0.62 0.67  CALCIUM 9.1 9.1 8.5*   Liver Function Tests: Recent Labs  Lab 05/24/19 0936 05/27/19 1619  AST 76* 63*  ALT 35 38  ALKPHOS 127* 118  BILITOT 0.5 0.8  PROT 8.0 8.2*  ALBUMIN 3.9 3.9   Recent Labs  Lab 05/27/19 1619  LIPASE 324*   No results for input(s): AMMONIA in the last 168 hours. CBC: Recent Labs  Lab 05/24/19 0936 05/27/19 1654 05/28/19 0333  WBC 6.6 10.4 10.5  NEUTROABS 3.0 5.6  --   HGB 14.1 14.7 12.7  HCT 42.6 44.2 39.4  MCV 87.7 87.9 90.0  PLT 242.0 240 217   Cardiac Enzymes: No results for input(s): CKTOTAL, CKMB, CKMBINDEX, TROPONINI in the last 168 hours. BNP: Invalid input(s): POCBNP CBG: Recent Labs  Lab 05/28/19 1224 05/28/19 1703 05/28/19 2217 05/29/19 0737  GLUCAP 158* 115* 154* 127*   D-Dimer No results for input(s): DDIMER in the last 72 hours. Hgb A1c No results for input(s): HGBA1C in the last 72 hours. Lipid Profile No results for input(s): CHOL, HDL, LDLCALC, TRIG, CHOLHDL, LDLDIRECT in the last 72 hours. Thyroid function studies No results for input(s): TSH, T4TOTAL, T3FREE, THYROIDAB in the last 72 hours.  Invalid input(s): FREET3 Anemia work up No results for input(s): VITAMINB12, FOLATE, FERRITIN, TIBC, IRON, RETICCTPCT in the last 72 hours. Urinalysis    Component Value Date/Time   COLORURINE  YELLOW 05/27/2019 1655   APPEARANCEUR CLEAR 05/27/2019 1655   LABSPEC 1.005 05/27/2019 1655   PHURINE 7.0 05/27/2019 1655   GLUCOSEU NEGATIVE 05/27/2019 1655   GLUCOSEU >=1000 (A) 07/21/2016 1039   HGBUR NEGATIVE 05/27/2019 1655   BILIRUBINUR NEGATIVE 05/27/2019 1655   BILIRUBINUR negative 11/01/2018 1123   KETONESUR NEGATIVE 05/27/2019 1655   PROTEINUR NEGATIVE 05/27/2019 1655   UROBILINOGEN negative (A) 11/01/2018 1123   UROBILINOGEN 0.2 07/21/2016 1039   NITRITE NEGATIVE 05/27/2019 1655   LEUKOCYTESUR NEGATIVE 05/27/2019 1655   Sepsis Labs Invalid input(s): PROCALCITONIN,  WBC,  LACTICIDVEN Microbiology Recent Results (from the past 240 hour(s))  SARS CORONAVIRUS 2 (TAT 6-24 HRS) Nasopharyngeal Nasopharyngeal Swab     Status: None   Collection Time: 05/27/19  6:25 PM   Specimen: Nasopharyngeal Swab  Result Value Ref Range Status  SARS Coronavirus 2 NEGATIVE NEGATIVE Final    Comment: (NOTE) SARS-CoV-2 target nucleic acids are NOT DETECTED. The SARS-CoV-2 RNA is generally detectable in upper and lower respiratory specimens during the acute phase of infection. Negative results do not preclude SARS-CoV-2 infection, do not rule out co-infections with other pathogens, and should not be used as the sole basis for treatment or other patient management decisions. Negative results must be combined with clinical observations, patient history, and epidemiological information. The expected result is Negative. Fact Sheet for Patients: SugarRoll.be Fact Sheet for Healthcare Providers: https://www.woods-Keeli Roberg.com/ This test is not yet approved or cleared by the [place] FDA and  has been authorized for detection and/or diagnosis of SARS-CoV-2 by FDA under an Emergency Use Authorization (EUA). This EUA will remain  in effect (meaning this test can be used) for the duration of the COVID-19 declaration under Section 56 4(b)(1) of the Act, 21  U.S.C. section 360bbb-3(b)(1), unless the authorization is terminated or revoked sooner. Performed at [facility], [place] [address] [place], [place] [address]      Time coordinating discharge:  38 minutes  SIGNED:   Georgette Shell, MD  [facility] 05/29/2019, 10:00 AM Pager   If 7PM-7AM, please contact night-coverage [email or web address] Password [email or web address]

## 2019-05-30 ENCOUNTER — Other Ambulatory Visit: Payer: Self-pay | Admitting: Surgery

## 2019-05-30 DIAGNOSIS — N6022 Fibroadenosis of left breast: Secondary | ICD-10-CM

## 2019-06-06 NOTE — Progress Notes (Signed)
[facility] DRUG STORE [place], [place] AT [place] [address] Phone: [phone] Fax: [place], [place] [address] [place] [address] Phone: [phone] Fax: [phone]      Your procedure is scheduled on Wednesday, May 17, 2019.  Report to [facility] Main Entrance "A" at 9:00 A.M., and check in at the Admitting office.  Call this number if you have problems the morning of surgery:  [phone]  Call [phone] if you have any questions prior to your surgery date Monday-Friday 8am-4pm    Remember:  Do not eat or drink after midnight the night before your surgery  You may drink clear liquids until 8:00 AM the morning of your surgery.   Clear liquids allowed are: Water, Non-Citrus Juices (without pulp), Carbonated Beverages, Clear Tea, Black Coffee Only, and Gatorade  Please complete your PRE-SURGERY ENSURE that was provided to you by 8:00 AM the morning of surgery.  Please, if able, drink it in one setting. DO NOT SIP.     Take these medicines the morning of surgery with A SIP OF WATER: atorvastatin (LIPITOR) cloNIDine (CATAPRES) gabapentin (NEURONTIN) metoprolol (TOPROL-XL) mupirocin ointment (BACTROBAN) 2 % omeprazole (PRILOSEC) fluticasone (FLONASE) - if needed nitroGLYCERIN (NITROSTAT) - if needed oxyCODONE-acetaminophen (PERCOCET) - if needed SYMBICORT 80-4.5 MCG/ACT inhaler - if needed  Please bring all inhalers with you the day of surgery.    7 days prior to surgery STOP taking any Aspirin (unless otherwise instructed by your surgeon), Aleve, Naproxen, Ibuprofen, Motrin, Advil, Goody's, BC's, all herbal medications, fish oil, and all vitamins.  Follow your surgeon's instructions on when to stop Aspirin.  If no instructions were given by your surgeon then you will need to call the office to get those instructions.     WHAT DO I DO  ABOUT MY DIABETES MEDICATION?   Marland Kitchen Do not take oral diabetic medications, (glipiZIDE (GLUCOTROL), the evening before or the morning of surgery.  . The day of surgery, do not take diabetes injectables, including Semaglutide (Ozempic).   HOW TO MANAGE YOUR DIABETES BEFORE AND AFTER SURGERY  Why is it important to control my blood sugar before and after surgery? . Improving blood sugar levels before and after surgery helps healing and can limit problems. . A way of improving blood sugar control is eating a healthy diet by: o  Eating less sugar and carbohydrates o  Increasing activity/exercise o  Talking with your doctor about reaching your blood sugar goals . High blood sugars (greater than 180 mg/dL) can raise your risk of infections and slow your recovery, so you will need to focus on controlling your diabetes during the weeks before surgery. . Make sure that the doctor who takes care of your diabetes knows about your planned surgery including the date and location.  How do I manage my blood sugar before surgery? . Check your blood sugar at least 4 times a day, starting 2 days before surgery, to make sure that the level is not too high or low. . Check your blood sugar the morning of your surgery when you wake up and every 2 hours until you get to the Short Stay unit. o If your blood sugar is less than 70 mg/dL, you will need to treat for low blood sugar: - Do not take insulin. - Treat a low blood sugar (less than 70 mg/dL) with  cup  of clear juice (cranberry or apple), 4 glucose tablets, OR glucose gel. - Recheck blood sugar in 15 minutes after treatment (to make sure it is greater than 70 mg/dL). If your blood sugar is not greater than 70 mg/dL on recheck, call [phone] for further instructions. . Report your blood sugar to the short stay nurse when you get to Short Stay.  . If you are admitted to the hospital after surgery: o Your blood sugar will be checked by the staff and you  will probably be given insulin after surgery (instead of oral diabetes medicines) to make sure you have good blood sugar levels. o The goal for blood sugar control after surgery is 80-180 mg/dL.    The Morning of Surgery  Do not wear jewelry, make-up or nail polish.  Do not wear lotions, powders, perfumes or deodorant  Do not shave 48 hours prior to surgery.  Do not bring valuables to the hospital.  [facility] is not responsible for any belongings or valuables.  If you are a smoker, DO NOT Smoke 24 hours prior to surgery  If you wear a CPAP at night please bring your mask the morning of surgery   Remember that you must have someone to transport you home after your surgery, and remain with you for 24 hours if you are discharged the same day.   Please bring cases for contacts, glasses, hearing aids, dentures or bridgework because it cannot be worn into surgery.    Leave your suitcase in the car.  After surgery it may be brought to your room.  For patients admitted to the hospital, discharge time will be determined by your treatment team.  Patients discharged the day of surgery will not be allowed to drive home.    Special instructions:   [place]- Preparing For Surgery  Before surgery, you can play an important role. Because skin is not sterile, your skin needs to be as free of germs as possible. You can reduce the number of germs on your skin by washing with CHG (chlorahexidine gluconate) Soap before surgery.  CHG is an antiseptic cleaner which kills germs and bonds with the skin to continue killing germs even after washing.    Oral Hygiene is also important to reduce your risk of infection.  Remember - BRUSH YOUR TEETH THE MORNING OF SURGERY WITH YOUR REGULAR TOOTHPASTE  Please do not use if you have an allergy to CHG or antibacterial soaps. If your skin becomes reddened/irritated stop using the CHG.  Do not shave (including legs and underarms) for at least 48 hours prior to  first CHG shower. It is OK to shave your face.  Please follow these instructions carefully.   1. Shower the NIGHT BEFORE SURGERY and the MORNING OF SURGERY with CHG Soap.   2. If you chose to wash your hair, wash your hair first as usual with your normal shampoo.  3. After you shampoo, rinse your hair and body thoroughly to remove the shampoo.  4. Use CHG as you would any other liquid soap. You can apply CHG directly to the skin and wash gently with a scrungie or a clean washcloth.   5. Apply the CHG Soap to your body ONLY FROM THE NECK DOWN.  Do not use on open wounds or open sores. Avoid contact with your eyes, ears, mouth and genitals (private parts). Wash Face and genitals (private parts)  with your normal soap.   6. Wash thoroughly, paying special attention to the area  where your surgery will be performed.  7. Thoroughly rinse your body with warm water from the neck down.  8. DO NOT shower/wash with your normal soap after using and rinsing off the CHG Soap.  9. Pat yourself dry with a CLEAN TOWEL.  10. Wear CLEAN PAJAMAS to bed the night before surgery, wear comfortable clothes the morning of surgery  11. Place CLEAN SHEETS on your bed the night of your first shower and DO NOT SLEEP WITH PETS.    Day of Surgery:  Please shower the morning of surgery with the CHG soap Do not apply any deodorants/lotions. Please wear clean clothes to the hospital/surgery center.   Remember to brush your teeth WITH YOUR REGULAR TOOTHPASTE.   Please read over the following fact sheets that you were given.

## 2019-06-07 ENCOUNTER — Encounter (HOSPITAL_COMMUNITY)
Admission: RE | Admit: 2019-06-07 | Discharge: 2019-06-07 | Disposition: A | Discharge status: Home / Self Care | Payer: Medicare Other | Source: Ambulatory Visit | Attending: Surgery | Admitting: Surgery

## 2019-06-07 ENCOUNTER — Encounter (HOSPITAL_COMMUNITY): Payer: Self-pay

## 2019-06-07 ENCOUNTER — Other Ambulatory Visit: Payer: Self-pay

## 2019-06-07 DIAGNOSIS — N6022 Fibroadenosis of left breast: Secondary | ICD-10-CM | POA: Insufficient documentation

## 2019-06-07 DIAGNOSIS — I110 Hypertensive heart disease with heart failure: Secondary | ICD-10-CM | POA: Insufficient documentation

## 2019-06-07 DIAGNOSIS — Z9981 Dependence on supplemental oxygen: Secondary | ICD-10-CM | POA: Insufficient documentation

## 2019-06-07 DIAGNOSIS — J449 Chronic obstructive pulmonary disease, unspecified: Secondary | ICD-10-CM | POA: Diagnosis not present

## 2019-06-07 DIAGNOSIS — Z79899 Other long term (current) drug therapy: Secondary | ICD-10-CM | POA: Diagnosis not present

## 2019-06-07 DIAGNOSIS — E119 Type 2 diabetes mellitus without complications: Secondary | ICD-10-CM | POA: Insufficient documentation

## 2019-06-07 DIAGNOSIS — Z01812 Encounter for preprocedural laboratory examination: Secondary | ICD-10-CM | POA: Diagnosis not present

## 2019-06-07 DIAGNOSIS — G4730 Sleep apnea, unspecified: Secondary | ICD-10-CM | POA: Diagnosis not present

## 2019-06-07 DIAGNOSIS — I5032 Chronic diastolic (congestive) heart failure: Secondary | ICD-10-CM | POA: Insufficient documentation

## 2019-06-07 LAB — BASIC METABOLIC PANEL
Anion gap: 11 (ref 5–15)
BUN: 6 mg/dL (ref 6–20)
CO2: 28 mmol/L (ref 22–32)
Calcium: 9.4 mg/dL (ref 8.9–10.3)
Chloride: 100 mmol/L (ref 98–111)
Creatinine, Ser: 0.73 mg/dL (ref 0.44–1.00)
GFR calc Af Amer: >60 mL/min (ref >60)
GFR calc non Af Amer: >60 mL/min (ref >60)
Glucose, Bld: 151 mg/dL — ABNORMAL HIGH (ref 70–99)
Potassium: 4.7 mmol/L (ref 3.5–5.1)
Sodium: 139 mmol/L (ref 135–145)

## 2019-06-07 LAB — CBC
HCT: 45.6 % (ref 36.0–46.0)
Hemoglobin: 14.7 g/dL (ref 12.0–15.0)
MCH: 28.7 pg (ref 26.0–34.0)
MCHC: 32.2 g/dL (ref 30.0–36.0)
MCV: 88.9 fL (ref 80.0–100.0)
Platelets: 309 10*3/uL (ref 150–400)
RBC: 5.13 MIL/uL — ABNORMAL HIGH (ref 3.87–5.11)
RDW: 14.1 % (ref 11.5–15.5)
WBC: 7.6 10*3/uL (ref 4.0–10.5)
nRBC: 0 % (ref 0.0–0.2)

## 2019-06-07 LAB — GLUCOSE, CAPILLARY: Glucose-Capillary: 141 mg/dL — ABNORMAL HIGH (ref 70–99)

## 2019-06-07 NOTE — Progress Notes (Signed)
PCP - Billey Gosling Cardiologist - denies  Chest x-ray - 05/26/13 EKG - 05/29/19 Stress Test - possibly in 2016 in [place] ECHO - 06/04/15 Cardiac Cath - denies  Sleep Study - h/o OSA CPAP - does not use  Fasting Blood Sugar - 90's Checks Blood Sugar 2 times a day  Aspirin Instructions: Patient instructed to hold all Aspirin, NSAID's, herbal medications, fish oil and vitamins 7 days prior to surgery.   Covid test: 06/10/19 @ 9:15 at [facility]. Pt instructed to remain in their car. Educated on Transport planner until [employer].  Anesthesia review: h/o CAD  Patient denies shortness of breath, fever, cough and chest pain at PAT appointment   Patient verbalized understanding of instructions that were given to them at the PAT appointment. Patient was also instructed that they will need to review over the PAT instructions again at home before surgery.

## 2019-06-07 NOTE — Progress Notes (Signed)
[facility] DRUG STORE [place], [place] AT [place] [address] Phone: [phone] Fax: [place], [place] [address] [place] [address] Phone: [phone] Fax: [phone]      Your procedure is scheduled on Wednesday, May 17, 2019.  Report to [facility] Main Entrance "A" at 9:00 A.M., and check in at the Admitting office.  Call this number if you have problems the morning of surgery:  [phone]  Call [phone] if you have any questions prior to your surgery date Monday-Friday 8am-4pm    Remember:  Do not eat after midnight the night before your surgery  You may drink clear liquids until 8:00 AM the morning of your surgery.   Clear liquids allowed are: Water, Non-Citrus Juices (without pulp), Carbonated Beverages, Clear Tea, Black Coffee Only, and Gatorade  Please complete your provided bottle of water by 8:00 AM the morning of surgery.  Please, if able, drink it in one setting. DO NOT SIP.     Take these medicines the morning of surgery with A SIP OF WATER: atorvastatin (LIPITOR) cloNIDine (CATAPRES) gabapentin (NEURONTIN) metoprolol (TOPROL-XL) mupirocin ointment (BACTROBAN) 2 % omeprazole (PRILOSEC) fluticasone (FLONASE) - if needed nitroGLYCERIN (NITROSTAT) - if needed oxyCODONE-acetaminophen (PERCOCET) - if needed SYMBICORT 80-4.5 MCG/ACT inhaler - if needed  Please bring all inhalers with you the day of surgery.    7 days prior to surgery STOP taking any Aspirin (unless otherwise instructed by your surgeon), Aleve, Naproxen, Ibuprofen, Motrin, Advil, Goody's, BC's, all herbal medications, fish oil, and all vitamins.  Follow your surgeon's instructions on when to stop Aspirin.  If no instructions were given by your surgeon then you will need to call the office to get those instructions.     WHAT DO I DO ABOUT MY DIABETES  MEDICATION?   Marland Kitchen Do not take oral diabetic medications, (glipiZIDE (GLUCOTROL), the evening before or the morning of surgery.  . The day of surgery, do not take diabetes injectables, including Semaglutide (Ozempic).   HOW TO MANAGE YOUR DIABETES BEFORE AND AFTER SURGERY  Why is it important to control my blood sugar before and after surgery? . Improving blood sugar levels before and after surgery helps healing and can limit problems. . A way of improving blood sugar control is eating a healthy diet by: o  Eating less sugar and carbohydrates o  Increasing activity/exercise o  Talking with your doctor about reaching your blood sugar goals . High blood sugars (greater than 180 mg/dL) can raise your risk of infections and slow your recovery, so you will need to focus on controlling your diabetes during the weeks before surgery. . Make sure that the doctor who takes care of your diabetes knows about your planned surgery including the date and location.  How do I manage my blood sugar before surgery? . Check your blood sugar at least 4 times a day, starting 2 days before surgery, to make sure that the level is not too high or low. . Check your blood sugar the morning of your surgery when you wake up and every 2 hours until you get to the Short Stay unit. o If your blood sugar is less than 70 mg/dL, you will need to treat for low blood sugar: - Do not take insulin. - Treat a low blood sugar (less than 70 mg/dL) with  cup of clear juice (cranberry or  apple), 4 glucose tablets, OR glucose gel. - Recheck blood sugar in 15 minutes after treatment (to make sure it is greater than 70 mg/dL). If your blood sugar is not greater than 70 mg/dL on recheck, call [phone] for further instructions. . Report your blood sugar to the short stay nurse when you get to Short Stay.  . If you are admitted to the hospital after surgery: o Your blood sugar will be checked by the staff and you will probably be  given insulin after surgery (instead of oral diabetes medicines) to make sure you have good blood sugar levels. o The goal for blood sugar control after surgery is 80-180 mg/dL.    The Morning of Surgery  Do not wear jewelry, make-up or nail polish.  Do not wear lotions, powders, perfumes or deodorant  Do not shave 48 hours prior to surgery.  Do not bring valuables to the hospital.  [facility] is not responsible for any belongings or valuables.  If you are a smoker, DO NOT Smoke 24 hours prior to surgery  If you wear a CPAP at night please bring your mask the morning of surgery   Remember that you must have someone to transport you home after your surgery, and remain with you for 24 hours if you are discharged the same day.   Please bring cases for contacts, glasses, hearing aids, dentures or bridgework because it cannot be worn into surgery.    Leave your suitcase in the car.  After surgery it may be brought to your room.  For patients admitted to the hospital, discharge time will be determined by your treatment team.  Patients discharged the day of surgery will not be allowed to drive home.    Special instructions:   [place]- Preparing For Surgery  Before surgery, you can play an important role. Because skin is not sterile, your skin needs to be as free of germs as possible. You can reduce the number of germs on your skin by washing with CHG (chlorahexidine gluconate) Soap before surgery.  CHG is an antiseptic cleaner which kills germs and bonds with the skin to continue killing germs even after washing.    Oral Hygiene is also important to reduce your risk of infection.  Remember - BRUSH YOUR TEETH THE MORNING OF SURGERY WITH YOUR REGULAR TOOTHPASTE  Please do not use if you have an allergy to CHG or antibacterial soaps. If your skin becomes reddened/irritated stop using the CHG.  Do not shave (including legs and underarms) for at least 48 hours prior to first CHG shower.  It is OK to shave your face.  Please follow these instructions carefully.   1. Shower the NIGHT BEFORE SURGERY and the MORNING OF SURGERY with CHG Soap.   2. If you chose to wash your hair, wash your hair first as usual with your normal shampoo.  3. After you shampoo, rinse your hair and body thoroughly to remove the shampoo.  4. Use CHG as you would any other liquid soap. You can apply CHG directly to the skin and wash gently with a scrungie or a clean washcloth.   5. Apply the CHG Soap to your body ONLY FROM THE NECK DOWN.  Do not use on open wounds or open sores. Avoid contact with your eyes, ears, mouth and genitals (private parts). Wash Face and genitals (private parts)  with your normal soap.   6. Wash thoroughly, paying special attention to the area where your surgery will be  performed.  7. Thoroughly rinse your body with warm water from the neck down.  8. DO NOT shower/wash with your normal soap after using and rinsing off the CHG Soap.  9. Pat yourself dry with a CLEAN TOWEL.  10. Wear CLEAN PAJAMAS to bed the night before surgery, wear comfortable clothes the morning of surgery  11. Place CLEAN SHEETS on your bed the night of your first shower and DO NOT SLEEP WITH PETS.    Day of Surgery:  Please shower the morning of surgery with the CHG soap Do not apply any deodorants/lotions. Please wear clean clothes to the hospital/surgery center.   Remember to brush your teeth WITH YOUR REGULAR TOOTHPASTE.   Please read over the following fact sheets that you were given.

## 2019-06-08 ENCOUNTER — Other Ambulatory Visit: Payer: Self-pay | Admitting: Internal Medicine

## 2019-06-08 NOTE — Anesthesia Preprocedure Evaluation (Addendum)
Anesthesia Evaluation  Patient identified by MRN, date of birth, ID band Patient awake    Reviewed: Allergy & Precautions, NPO status , Patient's Chart, lab work & pertinent test results, reviewed documented beta blocker date and time   Airway Mallampati: II  TM Distance: >3 FB Neck ROM: Full    Dental no notable dental hx.    Pulmonary sleep apnea , COPD,  oxygen dependent, Current Smoker,  2.5L oxygen at night per [place]  8.75 pack year history   Noncompliant with CPAP   Pulmonary exam normal breath sounds clear to auscultation       Cardiovascular hypertension, Pt. on medications and Pt. on home beta blockers +CHF  Normal cardiovascular exam Rhythm:Regular Rate:Normal  HLD  Last echo 2017: - Left ventricle: The cavity size was normal. Wall thickness wasincreased in a pattern of mild LVH. Systolic function was normal. The estimated ejection fraction was in the range of 55% to 60%.Wall motion was normal; there were no regional wall motionabnormalities. Doppler parameters are consistent with abnormalleft ventricular relaxation (grade 1 diastolic dysfunction).  - Aortic root: The aortic root was mildly dilated.  - Mitral valve: Calcified annulus.    Neuro/Psych  Headaches, PSYCHIATRIC DISORDERS Anxiety Depression CVA    GI/Hepatic Neg liver ROS, GERD  Medicated and Controlled, Recent admission 2/13-2/15 for abdominal pain and nausea and vomiting. Found to have a lipase of 324. CT of the abdomen and pelvis-mild edematous changes pancreatic head and uncinate process may represent early pancreatitis.   Endo/Other  diabetes, Type 2, Oral Hypoglycemic AgentsMorbid obesityBMI 48  Renal/GU negative Renal ROS  negative genitourinary   Musculoskeletal  (+) Arthritis , Osteoarthritis,  Fibromyalgia -peripheral neuropathy    Abdominal (+) + obese,   Peds negative pediatric ROS (+)  Hematology negative hematology ROS (+)    Anesthesia Other Findings Left breast complex sclerosing lesion x 2  Reproductive/Obstetrics negative OB ROS                                                             Anesthesia Evaluation  Patient identified by MRN, date of birth, ID band Patient awake    Reviewed: Allergy & Precautions, NPO status , Patient's Chart, lab work & pertinent test results, reviewed documented beta blocker date and time   History of Anesthesia Complications Negative for: history of anesthetic complications  Airway Mallampati: III  TM Distance: >3 FB Neck ROM: Full    Dental  (+) Teeth Intact,    Pulmonary sleep apnea , COPD, Current Smoker,    breath sounds clear to auscultation       Cardiovascular hypertension, Pt. on medications and Pt. on home beta blockers + CAD, + Peripheral Vascular Disease and +CHF   Rhythm:Regular     Neuro/Psych  Headaches, PSYCHIATRIC DISORDERS Anxiety Depression TIA Neuromuscular disease    GI/Hepatic Neg liver ROS, GERD  Medicated and Controlled,  Endo/Other  diabetesMorbid obesity  Renal/GU      Musculoskeletal  (+) Arthritis , Fibromyalgia -  Abdominal   Peds  Hematology negative hematology ROS (+)   Anesthesia Other Findings   Reproductive/Obstetrics                             Anesthesia Physical Anesthesia Plan  ASA: III  Anesthesia Plan: MAC, Regional and Spinal   Post-op Pain Management:    Induction:   PONV Risk Score and Plan: 1 and Ondansetron  Airway Management Planned: Nasal Cannula  Additional Equipment: None  Intra-op Plan:   Post-operative Plan:   Informed Consent: I have reviewed the patients History and Physical, chart, labs and discussed the procedure including the risks, benefits and alternatives for the proposed anesthesia with the patient or authorized representative who has indicated his/her understanding and acceptance.   Dental advisory  given  Plan Discussed with: CRNA and Surgeon  Anesthesia Plan Comments:         Anesthesia Quick Evaluation  Anesthesia Physical Anesthesia Plan  ASA: III  Anesthesia Plan: General   Post-op Pain Management:    Induction: Intravenous  PONV Risk Score and Plan: 3 and Ondansetron, Dexamethasone, Midazolam and Treatment may vary due to age or medical condition  Airway Management Planned: LMA and Oral ETT  Additional Equipment: None  Intra-op Plan:   Post-operative Plan: Extubation in OR  Informed Consent: I have reviewed the patients History and Physical, chart, labs and discussed the procedure including the risks, benefits and alternatives for the proposed anesthesia with the patient or authorized representative who has indicated his/her understanding and acceptance.     Dental advisory given  Plan Discussed with: CRNA  Anesthesia Plan Comments: ( )       Anesthesia Quick Evaluation

## 2019-06-08 NOTE — Progress Notes (Signed)
Anesthesia Chart Review:  59 y.o.femalewith medical history significant fordiastolic congestive heart failure, CAD, CVA, hypertension, COPD, OSA, type 2 diabetes with neuropathy and morbid obesity.  Recent admission 2/13-2/15 for abdominal pain and nausea and vomiting. Found to have a lipase of 324. CT of the abdomen and pelvis-mild edematous changes pancreatic head and uncinate process may represent early pancreatitis. No pancreatic ductal dilatation necrotic pancreatic tissue peripancreatic collections noted. Colonic diverticulosis without evidence of diverticulitis. 1 cm left adrenal nodule favors benign etiology. She tolerated regular diet prior to discharge.    Hx of OSA with nocturnal oxygen requirement, on O2 2L QHS.  Does not use CPAP.  Hx of DMII, last A1c 7.5 05/24/19.  Discussed case with Dr. Valma Cava due to recent admission for possible pancreatitis. He advised no special labs/testing given that she has symptomatically recovered.  Preop labs reviewed, unremarkable.   EKG 05/27/19: Sinus rhythm. Rate 75. Low voltage, precordial leads. Borderline T abnormalities, inferior leads. Borderline prolonged QT interval  TTE 11/01/15: - Left ventricle: The cavity size was normal. Wall thickness was  increased in a pattern of mild LVH. Systolic function was normal.  The estimated ejection fraction was in the range of 55% to 60%.  Wall motion was normal; there were no regional wall motion  abnormalities. Doppler parameters are consistent with abnormal  left ventricular relaxation (grade 1 diastolic dysfunction).  - Aortic root: The aortic root was mildly dilated.  - Mitral valve: Calcified annulus.   Impressions:   - Technically difficult; definity used; normal LV systolic  function; mild LVH; grade 1 diastolic dysfunction.    Wynonia Musty [facility] Short Stay Center/Anesthesiology Phone [phone] 06/08/2019 1:57 PM

## 2019-06-10 ENCOUNTER — Other Ambulatory Visit (HOSPITAL_COMMUNITY)
Admission: RE | Admit: 2019-06-10 | Discharge: 2019-06-10 | Disposition: A | Discharge status: Home / Self Care | Payer: Medicare Other | Source: Ambulatory Visit | Attending: Surgery | Admitting: Surgery

## 2019-06-10 DIAGNOSIS — Z20822 Contact with and (suspected) exposure to covid-19: Secondary | ICD-10-CM | POA: Insufficient documentation

## 2019-06-10 DIAGNOSIS — Z01812 Encounter for preprocedural laboratory examination: Secondary | ICD-10-CM | POA: Insufficient documentation

## 2019-06-10 LAB — SARS CORONAVIRUS 2 (TAT 6-24 HRS): SARS Coronavirus 2: NEGATIVE

## 2019-06-13 ENCOUNTER — Ambulatory Visit
Admission: RE | Admit: 2019-06-13 | Discharge: 2019-06-13 | Disposition: A | Discharge status: Home / Self Care | Payer: Medicare Other | Source: Ambulatory Visit | Attending: Surgery | Admitting: Surgery

## 2019-06-13 ENCOUNTER — Other Ambulatory Visit: Payer: Self-pay

## 2019-06-13 DIAGNOSIS — R928 Other abnormal and inconclusive findings on diagnostic imaging of breast: Secondary | ICD-10-CM | POA: Diagnosis not present

## 2019-06-13 DIAGNOSIS — N6022 Fibroadenosis of left breast: Secondary | ICD-10-CM

## 2019-06-13 DIAGNOSIS — R921 Mammographic calcification found on diagnostic imaging of breast: Secondary | ICD-10-CM | POA: Diagnosis not present

## 2019-06-13 MED ORDER — DEXTROSE 5 % IV SOLN
3.0000 g | INTRAVENOUS | Status: AC
  Administered 2019-06-14: 3 g via INTRAVENOUS
  Filled 2019-06-13: qty 3
  Filled 2019-06-13: qty 3000

## 2019-06-13 NOTE — H&P (Signed)
  Aliviya Basten Kitko Documented: 05/24/2019 3:16 PM Location: [facility] Patient #: [id number] DOB: Jan 27, 1961 Single / Language: Cleophus Molt / Race: Black or African American Female   History of Present Illness (Shadavia Dampier A. Ninfa Linden MD; 05/24/2019 3:52 PM) The patient is a 59 year old female who presents with a complaint of Breast problems. Chief complaint: Left breast abnormality  This is a 59 year old female who was recently found to have several abnormal areas in the left breast on screening mammography. She ended up having 3 areas biopsied by stereotactic guidance. One showed just fibrocystic changes. The other 2 showed 2 separate complex sclerosing lesions. Removal of these areas is been recommended by radiology. She has a family history of breast cancer sister in her 43s. She reports that her sister was also genetically positive. She has had no previous problems with her breasts herself. She denies nipple discharge. She is otherwise without complaints.   Allergies Malachi Bonds, CMA; 05/24/2019 3:17 PM) No Known Allergies [07/29/2016]:  Medication History Malachi Bonds, CMA; 05/24/2019 3:19 PM) glipiZIDE (5MG  Tablet, Oral) Active. Ipratropium-Albuterol (0.5-2.5 (3)MG/3ML Solution, Inhalation) Active. Ozempic (1 MG/DOSE) (2MG /1.5ML Soln Pen-inj, Subcutaneous) Active. Potassium Chloride (20MEQ Packet, Oral) Active. Atorvastatin Calcium (40MG  Tablet, Oral) Active. TiZANidine HCl (4MG  Tablet, Oral) Active. TraZODone HCl (50MG  Tablet, Oral) Active. Omeprazole (20MG  Capsule DR, Oral) Active. Metoprolol Succinate ER (200MG  Tablet ER 24HR, Oral) Active. Losartan Potassium (50MG  Tablet, Oral) Active. Gabapentin (800MG  Tablet, Oral) Active. Furosemide (80MG  Tablet, Oral) Active. Fluticasone Propionate (50MCG/ACT Suspension, Nasal) Active. Amitriptyline HCl (25MG  Tablet, Oral) Active. Medications Reconciled  Vitals (Chemira Jones CMA; 05/24/2019 3:17  PM) 05/24/2019 3:16 PM Weight: 302.4 lb Height: 65.75in Body Surface Area: 2.38 m Body Mass Index: 49.18 kg/m  BP: 130/84 (Sitting, Left Arm, Standard)       Physical Exam (Landon Bassford A. Ninfa Linden MD; 05/24/2019 3:53 PM) The physical exam findings are as follows: Note:She appears well.  There are no palpable breast masses. There is no axillary adenopathy.    Assessment & Plan (Bellamarie Pflug A. Ninfa Linden MD; 05/24/2019 3:55 PM) SCLEROSING ADENOSIS OF BREAST, LEFT (N60.22) Impression: This is a patient with 2 separate complex sclerosing lesions of the left breast. I have reviewed the patient's mammograms and ultrasound as well as the pathology results. I gave her a copy of the pathology results. We discussed the diagnosis in detail. A radioactive seed guided left breast lumpectomy 2 is strongly recommended to remove both these areas for complete histologic evaluation to rule out malignancy. I strongly recommend this also given her family history of breast cancer. I discussed the surgical procedure with her in detail. We discussed the risks which includes but is not limited to bleeding, infection, need for further surgery if malignancy is found, cardiopulmonary issues, etc. I would also strongly recommended we centered to the cancer center for genetic counseling postoperatively even if there are benign findings. She is at increased risk of surgical problems given her obesity. Prolonged discussion regarding surgery, she agrees to proceed the surgery will be planned.

## 2019-06-14 ENCOUNTER — Ambulatory Visit
Admission: RE | Admit: 2019-06-14 | Discharge: 2019-06-14 | Disposition: A | Discharge status: Home / Self Care | Payer: Medicare Other | Source: Ambulatory Visit | Attending: Surgery | Admitting: Surgery

## 2019-06-14 ENCOUNTER — Ambulatory Visit (HOSPITAL_COMMUNITY): Payer: Medicare Other | Admitting: Anesthesiology

## 2019-06-14 ENCOUNTER — Ambulatory Visit (HOSPITAL_COMMUNITY): Payer: Medicare Other | Admitting: Physician Assistant

## 2019-06-14 ENCOUNTER — Encounter (HOSPITAL_COMMUNITY): Payer: Self-pay | Admitting: Surgery

## 2019-06-14 ENCOUNTER — Ambulatory Visit (HOSPITAL_COMMUNITY)
Admission: RE | Admit: 2019-06-14 | Discharge: 2019-06-14 | Disposition: A | Discharge status: Home / Self Care | Payer: Medicare Other | Attending: Surgery | Admitting: Surgery

## 2019-06-14 ENCOUNTER — Encounter (HOSPITAL_COMMUNITY)
Admission: RE | Disposition: A | Discharge status: Home / Self Care | Payer: Self-pay | Source: Home / Self Care | Attending: Surgery

## 2019-06-14 DIAGNOSIS — Z7984 Long term (current) use of oral hypoglycemic drugs: Secondary | ICD-10-CM | POA: Insufficient documentation

## 2019-06-14 DIAGNOSIS — E785 Hyperlipidemia, unspecified: Secondary | ICD-10-CM | POA: Diagnosis not present

## 2019-06-14 DIAGNOSIS — N6022 Fibroadenosis of left breast: Secondary | ICD-10-CM | POA: Diagnosis not present

## 2019-06-14 DIAGNOSIS — Z803 Family history of malignant neoplasm of breast: Secondary | ICD-10-CM | POA: Diagnosis not present

## 2019-06-14 DIAGNOSIS — R921 Mammographic calcification found on diagnostic imaging of breast: Secondary | ICD-10-CM | POA: Diagnosis not present

## 2019-06-14 DIAGNOSIS — Z79899 Other long term (current) drug therapy: Secondary | ICD-10-CM | POA: Diagnosis not present

## 2019-06-14 DIAGNOSIS — R928 Other abnormal and inconclusive findings on diagnostic imaging of breast: Secondary | ICD-10-CM | POA: Diagnosis not present

## 2019-06-14 DIAGNOSIS — I5032 Chronic diastolic (congestive) heart failure: Secondary | ICD-10-CM | POA: Diagnosis not present

## 2019-06-14 DIAGNOSIS — Z6841 Body Mass Index (BMI) 40.0 and over, adult: Secondary | ICD-10-CM | POA: Diagnosis not present

## 2019-06-14 DIAGNOSIS — E669 Obesity, unspecified: Secondary | ICD-10-CM | POA: Insufficient documentation

## 2019-06-14 DIAGNOSIS — I110 Hypertensive heart disease with heart failure: Secondary | ICD-10-CM | POA: Diagnosis not present

## 2019-06-14 DIAGNOSIS — N6489 Other specified disorders of breast: Secondary | ICD-10-CM | POA: Diagnosis not present

## 2019-06-14 HISTORY — PX: BREAST LUMPECTOMY WITH RADIOACTIVE SEED LOCALIZATION: SHX6424

## 2019-06-14 LAB — GLUCOSE, CAPILLARY
Glucose-Capillary: 205 mg/dL — ABNORMAL HIGH (ref 70–99)
Glucose-Capillary: 104 mg/dL — ABNORMAL HIGH (ref 70–99)

## 2019-06-14 SURGERY — BREAST LUMPECTOMY WITH RADIOACTIVE SEED LOCALIZATION
Anesthesia: General | Site: Breast | Laterality: Left

## 2019-06-14 MED ORDER — BUPIVACAINE HCL (PF) 0.25 % IJ SOLN
INTRAMUSCULAR | Status: AC
  Filled 2019-06-14: qty 30

## 2019-06-14 MED ORDER — OXYCODONE HCL 5 MG PO TABS
ORAL_TABLET | ORAL | Status: AC
  Filled 2019-06-14: qty 1

## 2019-06-14 MED ORDER — CHLORHEXIDINE GLUCONATE CLOTH 2 % EX PADS: 6.0000 | MEDICATED_PAD | Freq: Once | CUTANEOUS | Status: DC

## 2019-06-14 MED ORDER — PROMETHAZINE HCL 25 MG/ML IJ SOLN: 6.2500 mg | INTRAMUSCULAR | Status: DC | PRN

## 2019-06-14 MED ORDER — HYDROMORPHONE HCL 1 MG/ML IJ SOLN
INTRAMUSCULAR | Status: AC
  Administered 2019-06-14: 0.5 mg via INTRAVENOUS
  Filled 2019-06-14: qty 1

## 2019-06-14 MED ORDER — FENTANYL CITRATE (PF) 250 MCG/5ML IJ SOLN
INTRAMUSCULAR | Status: AC
  Filled 2019-06-14: qty 5

## 2019-06-14 MED ORDER — OXYCODONE HCL 5 MG PO TABS: 5.0000 mg | ORAL_TABLET | Freq: Four times a day (QID) | ORAL | 0 refills | Status: DC | PRN

## 2019-06-14 MED ORDER — MIDAZOLAM HCL 2 MG/2ML IJ SOLN
INTRAMUSCULAR | Status: AC
  Filled 2019-06-14: qty 2

## 2019-06-14 MED ORDER — SODIUM CHLORIDE (PF) 0.9 % IJ SOLN
INTRAMUSCULAR | Status: AC
  Filled 2019-06-14: qty 10

## 2019-06-14 MED ORDER — BUPIVACAINE HCL (PF) 0.25 % IJ SOLN
INTRAMUSCULAR | Status: DC | PRN
  Administered 2019-06-14: 20 mL

## 2019-06-14 MED ORDER — ACETAMINOPHEN 500 MG PO TABS
1000.0000 mg | ORAL_TABLET | ORAL | Status: AC
  Administered 2019-06-14: 1000 mg via ORAL
  Filled 2019-06-14: qty 2

## 2019-06-14 MED ORDER — LACTATED RINGERS IV SOLN: INTRAVENOUS | Status: DC

## 2019-06-14 MED ORDER — CELECOXIB 200 MG PO CAPS
400.0000 mg | ORAL_CAPSULE | ORAL | Status: AC
  Administered 2019-06-14: 400 mg via ORAL
  Filled 2019-06-14: qty 2

## 2019-06-14 MED ORDER — LIDOCAINE HCL (CARDIAC) PF 100 MG/5ML IV SOSY
PREFILLED_SYRINGE | INTRAVENOUS | Status: DC | PRN
  Administered 2019-06-14: 60 mg via INTRATRACHEAL

## 2019-06-14 MED ORDER — OXYCODONE HCL 5 MG PO TABS
5.0000 mg | ORAL_TABLET | Freq: Once | ORAL | Status: AC | PRN
  Administered 2019-06-14: 5 mg via ORAL

## 2019-06-14 MED ORDER — SUGAMMADEX SODIUM 500 MG/5ML IV SOLN
INTRAVENOUS | Status: DC | PRN
  Administered 2019-06-14: 400 mg via INTRAVENOUS

## 2019-06-14 MED ORDER — DEXAMETHASONE SODIUM PHOSPHATE 10 MG/ML IJ SOLN
INTRAMUSCULAR | Status: DC | PRN
  Administered 2019-06-14: 5 mg via INTRAVENOUS

## 2019-06-14 MED ORDER — ALBUTEROL SULFATE (2.5 MG/3ML) 0.083% IN NEBU
INHALATION_SOLUTION | RESPIRATORY_TRACT | Status: AC
  Administered 2019-06-14: 2.5 mg via RESPIRATORY_TRACT
  Filled 2019-06-14: qty 3

## 2019-06-14 MED ORDER — HYDROMORPHONE HCL 1 MG/ML IJ SOLN
0.2500 mg | INTRAMUSCULAR | Status: DC | PRN
  Administered 2019-06-14: 0.5 mg via INTRAVENOUS

## 2019-06-14 MED ORDER — ENSURE PRE-SURGERY PO LIQD: 296.0000 mL | Freq: Once | ORAL | Status: DC

## 2019-06-14 MED ORDER — ACETAMINOPHEN 500 MG PO TABS: 1000.0000 mg | ORAL_TABLET | Freq: Once | ORAL | Status: DC

## 2019-06-14 MED ORDER — PHENYLEPHRINE 40 MCG/ML (10ML) SYRINGE FOR IV PUSH (FOR BLOOD PRESSURE SUPPORT)
PREFILLED_SYRINGE | INTRAVENOUS | Status: DC | PRN
  Administered 2019-06-14: 80 ug via INTRAVENOUS

## 2019-06-14 MED ORDER — ONDANSETRON HCL 4 MG/2ML IJ SOLN
INTRAMUSCULAR | Status: DC | PRN
  Administered 2019-06-14: 4 mg via INTRAVENOUS

## 2019-06-14 MED ORDER — ALBUTEROL SULFATE (2.5 MG/3ML) 0.083% IN NEBU: 2.5000 mg | INHALATION_SOLUTION | Freq: Once | RESPIRATORY_TRACT | Status: AC

## 2019-06-14 MED ORDER — METHYLENE BLUE 0.5 % INJ SOLN
INTRAVENOUS | Status: AC
  Filled 2019-06-14: qty 10

## 2019-06-14 MED ORDER — MEPERIDINE HCL 25 MG/ML IJ SOLN: 6.2500 mg | INTRAMUSCULAR | Status: DC | PRN

## 2019-06-14 MED ORDER — 0.9 % SODIUM CHLORIDE (POUR BTL) OPTIME
TOPICAL | Status: DC | PRN
  Administered 2019-06-14: 1000 mL

## 2019-06-14 MED ORDER — KETOROLAC TROMETHAMINE 30 MG/ML IJ SOLN: 30.0000 mg | Freq: Once | INTRAMUSCULAR | Status: AC | PRN

## 2019-06-14 MED ORDER — PROPOFOL 10 MG/ML IV BOLUS
INTRAVENOUS | Status: DC | PRN
  Administered 2019-06-14: 200 mg via INTRAVENOUS

## 2019-06-14 MED ORDER — OXYCODONE HCL 5 MG/5ML PO SOLN: 5.0000 mg | Freq: Once | ORAL | Status: AC | PRN

## 2019-06-14 MED ORDER — FENTANYL CITRATE (PF) 100 MCG/2ML IJ SOLN
INTRAMUSCULAR | Status: DC | PRN
  Administered 2019-06-14: 25 ug via INTRAVENOUS
  Administered 2019-06-14: 50 ug via INTRAVENOUS
  Administered 2019-06-14: 25 ug via INTRAVENOUS

## 2019-06-14 MED ORDER — GABAPENTIN 300 MG PO CAPS
300.0000 mg | ORAL_CAPSULE | ORAL | Status: DC
  Filled 2019-06-14: qty 1

## 2019-06-14 MED ORDER — ROCURONIUM BROMIDE 50 MG/5ML IV SOSY
PREFILLED_SYRINGE | INTRAVENOUS | Status: DC | PRN
  Administered 2019-06-14: 30 mg via INTRAVENOUS

## 2019-06-14 MED ORDER — KETOROLAC TROMETHAMINE 30 MG/ML IJ SOLN
INTRAMUSCULAR | Status: AC
  Administered 2019-06-14: 30 mg via INTRAVENOUS
  Filled 2019-06-14: qty 1

## 2019-06-14 SURGICAL SUPPLY — 31 items
APPLIER CLIP 9.375 MED OPEN (MISCELLANEOUS) ×2
BINDER BREAST LRG (GAUZE/BANDAGES/DRESSINGS) IMPLANT
BINDER BREAST XLRG (GAUZE/BANDAGES/DRESSINGS) IMPLANT
CANISTER SUCT 3000ML PPV (MISCELLANEOUS) ×2 IMPLANT
CHLORAPREP W/TINT 26 (MISCELLANEOUS) ×2 IMPLANT
CLIP APPLIE 9.375 MED OPEN (MISCELLANEOUS) ×1 IMPLANT
COVER PROBE W GEL 5X96 (DRAPES) ×2 IMPLANT
COVER SURGICAL LIGHT HANDLE (MISCELLANEOUS) ×2 IMPLANT
COVER WAND RF STERILE (DRAPES) ×2 IMPLANT
DERMABOND ADVANCED (GAUZE/BANDAGES/DRESSINGS) ×1
DERMABOND ADVANCED .7 DNX12 (GAUZE/BANDAGES/DRESSINGS) ×1 IMPLANT
DEVICE DUBIN SPECIMEN MAMMOGRA (MISCELLANEOUS) ×2 IMPLANT
DRAPE CHEST BREAST 15X10 FENES (DRAPES) ×2 IMPLANT
ELECT CAUTERY BLADE 6.4 (BLADE) ×2 IMPLANT
ELECT REM PT RETURN 9FT ADLT (ELECTROSURGICAL) ×2
ELECTRODE REM PT RTRN 9FT ADLT (ELECTROSURGICAL) ×1 IMPLANT
GLOVE SURG SIGNA 7.5 PF LTX (GLOVE) ×2 IMPLANT
GOWN STRL REUS W/ TWL LRG LVL3 (GOWN DISPOSABLE) ×1 IMPLANT
GOWN STRL REUS W/ TWL XL LVL3 (GOWN DISPOSABLE) ×1 IMPLANT
GOWN STRL REUS W/TWL LRG LVL3 (GOWN DISPOSABLE) ×1
GOWN STRL REUS W/TWL XL LVL3 (GOWN DISPOSABLE) ×1
KIT BASIN OR (CUSTOM PROCEDURE TRAY) ×2 IMPLANT
KIT MARKER MARGIN INK (KITS) ×2 IMPLANT
NEEDLE HYPO 25GX1X1/2 BEV (NEEDLE) ×2 IMPLANT
NS IRRIG 1000ML POUR BTL (IV SOLUTION) IMPLANT
PACK GENERAL/GYN (CUSTOM PROCEDURE TRAY) ×2 IMPLANT
SUT MNCRL AB 4-0 PS2 18 (SUTURE) ×2 IMPLANT
SUT VIC AB 3-0 SH 18 (SUTURE) ×2 IMPLANT
SYR CONTROL 10ML LL (SYRINGE) ×2 IMPLANT
TOWEL GREEN STERILE (TOWEL DISPOSABLE) ×2 IMPLANT
TOWEL GREEN STERILE FF (TOWEL DISPOSABLE) ×2 IMPLANT

## 2019-06-14 NOTE — Anesthesia Procedure Notes (Signed)
Procedure Name: Intubation Date/Time: 06/14/2019 11:16 AM Performed by: Griffin Dakin, CRNA Pre-anesthesia Checklist: Patient identified, Emergency Drugs available, Suction available and Patient being monitored Patient Re-evaluated:Patient Re-evaluated prior to induction Oxygen Delivery Method: Circle system utilized Preoxygenation: Pre-oxygenation with 100% oxygen Induction Type: IV induction Ventilation: Two handed mask ventilation required and Oral airway inserted - appropriate to patient size Laryngoscope Size: Mac and 4 Grade View: Grade II Tube type: Oral Number of attempts: 1 Airway Equipment and Method: Stylet and Oral airway Placement Confirmation: ETT inserted through vocal cords under direct vision,  positive ETCO2 and breath sounds checked- equal and bilateral Secured at: 21 cm Tube secured with: Tape Dental Injury: Teeth and Oropharynx as per pre-operative assessment

## 2019-06-14 NOTE — Anesthesia Postprocedure Evaluation (Signed)
Anesthesia Post Note  Patient: Tammy Strickland  Procedure(s) Performed: LEFT BREAST LUMPECTOMY WITH RADIOACTIVE SEED LOCALIZATION X 2 (Left Breast)     Patient location during evaluation: PACU Anesthesia Type: General Level of consciousness: awake and alert, oriented and patient cooperative Pain management: pain level controlled Vital Signs Assessment: post-procedure vital signs reviewed and stable Respiratory status: spontaneous breathing, nonlabored ventilation and respiratory function stable Cardiovascular status: blood pressure returned to baseline and stable Postop Assessment: no apparent nausea or vomiting Anesthetic complications: no    Last Vitals:  Vitals:   06/14/19 0858  BP: 120/79  Pulse: 75  Resp: 20  Temp: 36.9 C  SpO2: 97%    Last Pain:  Vitals:   06/14/19 0913  TempSrc:   PainSc: 0-No pain                 Pervis Hocking

## 2019-06-14 NOTE — Progress Notes (Signed)
Nursing staff having a difficult time obtaining peripheral IV on right arm. Dr. Doroteo Glassman stated "ok to place IV on left arm since she is not having lymph nodes removed". IV placed in right hand per anesthesia.

## 2019-06-14 NOTE — Interval H&P Note (Signed)
History and Physical Interval Note:no change in H and P  06/14/2019 9:49 AM  Tammy Strickland  has presented today for surgery, with the diagnosis of LEFT BREAST COMPLEX SCLEROSING LESION X 2.  The various methods of treatment have been discussed with the patient and family. After consideration of risks, benefits and other options for treatment, the patient has consented to  Procedure(s) with comments: LEFT BREAST LUMPECTOMY WITH RADIOACTIVE SEED LOCALIZATION X 2 (Left) - LMA as a surgical intervention.  The patient's history has been reviewed, patient examined, no change in status, stable for surgery.  I have reviewed the patient's chart and labs.  Questions were answered to the patient's satisfaction.     Coralie Keens

## 2019-06-14 NOTE — Discharge Instructions (Signed)
[place] Office Phone Number [phone]  BREAST BIOPSY/ PARTIAL MASTECTOMY: POST OP INSTRUCTIONS  Always review your discharge instruction sheet given to you by the facility where your surgery was performed.  IF YOU HAVE DISABILITY OR FAMILY LEAVE FORMS, YOU MUST BRING THEM TO THE OFFICE FOR PROCESSING.  DO NOT GIVE THEM TO YOUR DOCTOR.  1. A prescription for pain medication may be given to you upon discharge.  Take your pain medication as prescribed, if needed.  If narcotic pain medicine is not needed, then you may take acetaminophen (Tylenol) or ibuprofen (Advil) as needed. 2. Take your usually prescribed medications unless otherwise directed 3. If you need a refill on your pain medication, please contact your pharmacy.  They will contact our office to request authorization.  Prescriptions will not be filled after 5pm or on week-ends. 4. You should eat very light the first 24 hours after surgery, such as soup, crackers, pudding, etc.  Resume your normal diet the day after surgery. 5. Most patients will experience some swelling and bruising in the breast.  Ice packs and a good support bra will help.  Swelling and bruising can take several days to resolve.  6. It is common to experience some constipation if taking pain medication after surgery.  Increasing fluid intake and taking a stool softener will usually help or prevent this problem from occurring.  A mild laxative (Milk of Magnesia or Miralax) should be taken according to package directions if there are no bowel movements after 48 hours. 7. Unless discharge instructions indicate otherwise, you may remove your bandages 24-48 hours after surgery, and you may shower at that time.  You may have steri-strips (small skin tapes) in place directly over the incision.  These strips should be left on the skin for 7-10 days.  If your surgeon used skin glue on the incision, you may shower in 24 hours.  The glue will flake off over the  next 2-3 weeks.  Any sutures or staples will be removed at the office during your follow-up visit. 8. ACTIVITIES:  You may resume regular daily activities (gradually increasing) beginning the next day.  Wearing a good support bra or sports bra minimizes pain and swelling.  You may have sexual intercourse when it is comfortable. a. You may drive when you no longer are taking prescription pain medication, you can comfortably wear a seatbelt, and you can safely maneuver your car and apply brakes. b. RETURN TO WORK:  ______________________________________________________________________________________ 9. You should see your doctor in the office for a follow-up appointment approximately two weeks after your surgery.  Your doctor's nurse will typically make your follow-up appointment when she calls you with your pathology report.  Expect your pathology report 2-3 business days after your surgery.  You may call to check if you do not hear from [place] after three days. 10. OTHER INSTRUCTIONS:OK TO SHOWER STARTING TOMORROW 11. ICE PACK, TYLENOL, IBUPROFEN ALSO FOR PAIN 12. NO VIGOROUS ACTIVITY FOR ONE WEEK _______________________________________________________________________________________________ _____________________________________________________________________________________________________________________________________ _____________________________________________________________________________________________________________________________________ _____________________________________________________________________________________________________________________________________  WHEN TO CALL YOUR DOCTOR: 1. Fever over 101.0 2. Nausea and/or vomiting. 3. Extreme swelling or bruising. 4. Continued bleeding from incision. 5. Increased pain, redness, or drainage from the incision.  The clinic staff is available to answer your questions during regular business hours.  Please don't hesitate to call  and ask to speak to one of the nurses for clinical concerns.  If you have a medical emergency, go to the nearest emergency room or call 911.  A surgeon  from [facility] is always on call at the hospital.  For further questions, please visit [email or web address]

## 2019-06-14 NOTE — Transfer of Care (Signed)
Immediate Anesthesia Transfer of Care Note  Patient: Tammy Strickland  Procedure(s) Performed: LEFT BREAST LUMPECTOMY WITH RADIOACTIVE SEED LOCALIZATION X 2 (Left Breast)  Patient Location: PACU  Anesthesia Type:General  Level of Consciousness: awake, alert  and oriented  Airway & Oxygen Therapy: Patient Spontanous Breathing and Patient connected to face mask oxygen  Post-op Assessment: Report given to RN and Post -op Vital signs reviewed and stable  Post vital signs: Reviewed and stable  Last Vitals:  Vitals Value Taken Time  BP 134/89 06/14/19 1207  Temp    Pulse 78 06/14/19 1210  Resp 20 06/14/19 1210  SpO2 98 % 06/14/19 1210  Vitals shown include unvalidated device data.  Last Pain:  Vitals:   06/14/19 0913  TempSrc:   PainSc: 0-No pain         Complications: No apparent anesthesia complications

## 2019-06-14 NOTE — Op Note (Signed)
LEFT BREAST LUMPECTOMY WITH RADIOACTIVE SEED LOCALIZATION X 2  Procedure Note  Tammy Strickland 06/14/2019   Pre-op Diagnosis: LEFT BREAST COMPLEX SCLEROSING LESION X 2     Post-op Diagnosis: same  Procedure(s): LEFT BREAST LUMPECTOMY WITH RADIOACTIVE SEED LOCALIZATION X 2  Surgeon(s): Coralie Keens, MD  Anesthesia: General  Staff:  Circulator: Rometta Emery, RN Scrub Person: Dollene Cleveland T  Estimated Blood Loss: Minimal               Specimens: sent to path  Indication: This is a 59 year old female who was found to have 2 separate abnormalities in the left breast on mammogram.  Biopsy of these 2 masses showed 2 separate complex grossing lesions.  The decision was made to proceed with a radioactive seed guided lumpectomy x2  Procedure: The patient was brought to the operating room identifies correct patient.  She is placed upon the operating table general anesthesia was induced.  Her left breast was then prepped and draped in usual sterile fashion.  One of the seeds was located in the upper breast at the 12 o'clock position and the other was located in the lower left breast in the 6 o'clock position.  I anesthetized skin for the suspicious area at the 12 o'clock position with Marcaine.  I then made incision with a scalpel.  I dissected down to the breast tissue with the aid of the neoprobe staying widely around the radioactive seed.  I then completed the lumpectomy removing the suspicious area.  Margins were marked with paint and x-rays performed on specimen confirming that the radioactive seed and previous tissue marker were in the specimen.  This was then sent to pathology for evaluation.  I next anesthetized the skin of 6 o'clock position of the left breast over the second seed.  I made incision with a scalpel and then dissected into the deep breast tissue with electrocautery.  I can performed a wide lumpectomy staying around the radioactive seed with aid of the neoprobe.  Once  the specimen was removed I marked the margins with marker paint and we x-rayed the specimen.  The radioactive seed and suspicious calcifications were in the specimen but the previous tissue marker was not.  This was sent to pathology.  I did take some more lateral tissue to this and send it separately.  At this point hemostasis appeared to be achieved at both sites.  I then closed both incisions with interrupted 3-0 Vicryl sutures and 4-0 Monocryl.  Dermabond was then applied.  Patient tolerated procedure well.  All the counts were correct at the end the procedure.  The patient was then extubated in the operating room and taken in stable condition to the recovery room.          Coralie Keens   Date: 06/14/2019  Time: 12:01 PM

## 2019-06-16 LAB — SURGICAL PATHOLOGY

## 2019-06-26 ENCOUNTER — Encounter: Payer: Self-pay | Admitting: *Deleted

## 2019-07-04 DIAGNOSIS — E11319 Type 2 diabetes mellitus with unspecified diabetic retinopathy without macular edema: Secondary | ICD-10-CM | POA: Diagnosis not present

## 2019-07-05 ENCOUNTER — Encounter: Payer: Self-pay | Admitting: Certified Nurse Midwife

## 2019-07-08 ENCOUNTER — Other Ambulatory Visit: Payer: Self-pay | Admitting: Internal Medicine

## 2019-07-21 DIAGNOSIS — M25562 Pain in left knee: Secondary | ICD-10-CM | POA: Diagnosis not present

## 2019-07-21 DIAGNOSIS — M25559 Pain in unspecified hip: Secondary | ICD-10-CM | POA: Diagnosis not present

## 2019-07-21 DIAGNOSIS — Z79891 Long term (current) use of opiate analgesic: Secondary | ICD-10-CM | POA: Diagnosis not present

## 2019-07-21 DIAGNOSIS — M545 Low back pain: Secondary | ICD-10-CM | POA: Diagnosis not present

## 2019-07-21 DIAGNOSIS — G894 Chronic pain syndrome: Secondary | ICD-10-CM | POA: Diagnosis not present

## 2019-08-12 ENCOUNTER — Emergency Department (HOSPITAL_COMMUNITY): Payer: Medicare Other

## 2019-08-12 ENCOUNTER — Encounter (HOSPITAL_COMMUNITY): Payer: Self-pay | Admitting: Emergency Medicine

## 2019-08-12 ENCOUNTER — Emergency Department (HOSPITAL_COMMUNITY)
Admission: EM | Admit: 2019-08-12 | Discharge: 2019-08-12 | Disposition: A | Discharge status: Home / Self Care | Payer: Medicare Other | Attending: Emergency Medicine | Admitting: Emergency Medicine

## 2019-08-12 DIAGNOSIS — Z743 Need for continuous supervision: Secondary | ICD-10-CM | POA: Diagnosis not present

## 2019-08-12 DIAGNOSIS — R1013 Epigastric pain: Secondary | ICD-10-CM | POA: Diagnosis not present

## 2019-08-12 DIAGNOSIS — J449 Chronic obstructive pulmonary disease, unspecified: Secondary | ICD-10-CM | POA: Insufficient documentation

## 2019-08-12 DIAGNOSIS — F17210 Nicotine dependence, cigarettes, uncomplicated: Secondary | ICD-10-CM | POA: Diagnosis not present

## 2019-08-12 DIAGNOSIS — R110 Nausea: Secondary | ICD-10-CM | POA: Insufficient documentation

## 2019-08-12 DIAGNOSIS — I110 Hypertensive heart disease with heart failure: Secondary | ICD-10-CM | POA: Diagnosis not present

## 2019-08-12 DIAGNOSIS — R1011 Right upper quadrant pain: Secondary | ICD-10-CM | POA: Diagnosis not present

## 2019-08-12 DIAGNOSIS — E1140 Type 2 diabetes mellitus with diabetic neuropathy, unspecified: Secondary | ICD-10-CM | POA: Insufficient documentation

## 2019-08-12 DIAGNOSIS — R1084 Generalized abdominal pain: Secondary | ICD-10-CM | POA: Diagnosis not present

## 2019-08-12 DIAGNOSIS — Z79899 Other long term (current) drug therapy: Secondary | ICD-10-CM | POA: Insufficient documentation

## 2019-08-12 DIAGNOSIS — Z8673 Personal history of transient ischemic attack (TIA), and cerebral infarction without residual deficits: Secondary | ICD-10-CM | POA: Diagnosis not present

## 2019-08-12 DIAGNOSIS — R197 Diarrhea, unspecified: Secondary | ICD-10-CM | POA: Diagnosis not present

## 2019-08-12 DIAGNOSIS — M549 Dorsalgia, unspecified: Secondary | ICD-10-CM | POA: Diagnosis not present

## 2019-08-12 DIAGNOSIS — Z7984 Long term (current) use of oral hypoglycemic drugs: Secondary | ICD-10-CM | POA: Diagnosis not present

## 2019-08-12 DIAGNOSIS — R0902 Hypoxemia: Secondary | ICD-10-CM | POA: Diagnosis not present

## 2019-08-12 DIAGNOSIS — I5032 Chronic diastolic (congestive) heart failure: Secondary | ICD-10-CM | POA: Insufficient documentation

## 2019-08-12 DIAGNOSIS — Z96652 Presence of left artificial knee joint: Secondary | ICD-10-CM | POA: Insufficient documentation

## 2019-08-12 DIAGNOSIS — R109 Unspecified abdominal pain: Secondary | ICD-10-CM | POA: Diagnosis not present

## 2019-08-12 DIAGNOSIS — Z7982 Long term (current) use of aspirin: Secondary | ICD-10-CM | POA: Diagnosis not present

## 2019-08-12 DIAGNOSIS — J029 Acute pharyngitis, unspecified: Secondary | ICD-10-CM | POA: Diagnosis not present

## 2019-08-12 DIAGNOSIS — R0789 Other chest pain: Secondary | ICD-10-CM | POA: Insufficient documentation

## 2019-08-12 DIAGNOSIS — I10 Essential (primary) hypertension: Secondary | ICD-10-CM | POA: Diagnosis not present

## 2019-08-12 LAB — CBC
HCT: 47.3 % — ABNORMAL HIGH (ref 36.0–46.0)
Hemoglobin: 15.4 g/dL — ABNORMAL HIGH (ref 12.0–15.0)
MCH: 29.2 pg (ref 26.0–34.0)
MCHC: 32.6 g/dL (ref 30.0–36.0)
MCV: 89.8 fL (ref 80.0–100.0)
Platelets: 237 10*3/uL (ref 150–400)
RBC: 5.27 MIL/uL — ABNORMAL HIGH (ref 3.87–5.11)
RDW: 14.6 % (ref 11.5–15.5)
WBC: 9.1 10*3/uL (ref 4.0–10.5)
nRBC: 0 % (ref 0.0–0.2)

## 2019-08-12 LAB — URINALYSIS, ROUTINE W REFLEX MICROSCOPIC
Bilirubin Urine: NEGATIVE
Glucose, UA: NEGATIVE mg/dL
Hgb urine dipstick: NEGATIVE
Ketones, ur: NEGATIVE mg/dL
Leukocytes,Ua: NEGATIVE
Nitrite: NEGATIVE
Protein, ur: NEGATIVE mg/dL
Specific Gravity, Urine: 1.019 (ref 1.005–1.030)
pH: 5 (ref 5.0–8.0)

## 2019-08-12 LAB — LIPASE, BLOOD: Lipase: 21 U/L (ref 11–51)

## 2019-08-12 LAB — COMPREHENSIVE METABOLIC PANEL
ALT: 62 U/L — ABNORMAL HIGH (ref 0–44)
AST: 100 U/L — ABNORMAL HIGH (ref 15–41)
Albumin: 3.8 g/dL (ref 3.5–5.0)
Alkaline Phosphatase: 116 U/L (ref 38–126)
Anion gap: 11 (ref 5–15)
BUN: 13 mg/dL (ref 6–20)
CO2: 24 mmol/L (ref 22–32)
Calcium: 8.8 mg/dL — ABNORMAL LOW (ref 8.9–10.3)
Chloride: 104 mmol/L (ref 98–111)
Creatinine, Ser: 0.73 mg/dL (ref 0.44–1.00)
GFR calc Af Amer: >60 mL/min (ref >60)
GFR calc non Af Amer: >60 mL/min (ref >60)
Glucose, Bld: 164 mg/dL — ABNORMAL HIGH (ref 70–99)
Potassium: 3.8 mmol/L (ref 3.5–5.1)
Sodium: 139 mmol/L (ref 135–145)
Total Bilirubin: 0.9 mg/dL (ref 0.3–1.2)
Total Protein: 8 g/dL (ref 6.5–8.1)

## 2019-08-12 MED ORDER — SODIUM CHLORIDE 0.9% FLUSH
3.0000 mL | Freq: Once | INTRAVENOUS | Status: AC
  Administered 2019-08-12: 3 mL via INTRAVENOUS

## 2019-08-12 MED ORDER — FAMOTIDINE IN NACL 20-0.9 MG/50ML-% IV SOLN
20.0000 mg | Freq: Once | INTRAVENOUS | Status: AC
  Administered 2019-08-12: 20 mg via INTRAVENOUS
  Filled 2019-08-12: qty 50

## 2019-08-12 MED ORDER — ALUM & MAG HYDROXIDE-SIMETH 200-200-20 MG/5ML PO SUSP
30.0000 mL | Freq: Once | ORAL | Status: AC
  Administered 2019-08-12: 30 mL via ORAL
  Filled 2019-08-12: qty 30

## 2019-08-12 MED ORDER — ONDANSETRON HCL 4 MG/2ML IJ SOLN
4.0000 mg | Freq: Once | INTRAMUSCULAR | Status: AC
  Administered 2019-08-12: 4 mg via INTRAVENOUS
  Filled 2019-08-12: qty 2

## 2019-08-12 MED ORDER — MORPHINE SULFATE (PF) 4 MG/ML IV SOLN
4.0000 mg | Freq: Once | INTRAVENOUS | Status: AC
  Administered 2019-08-12: 4 mg via INTRAVENOUS
  Filled 2019-08-12: qty 1

## 2019-08-12 MED ORDER — MORPHINE SULFATE (PF) 4 MG/ML IV SOLN
4.0000 mg | Freq: Once | INTRAVENOUS | Status: DC
  Filled 2019-08-12: qty 1

## 2019-08-12 MED ORDER — DROPERIDOL 2.5 MG/ML IJ SOLN
1.2500 mg | Freq: Once | INTRAMUSCULAR | Status: AC
  Administered 2019-08-12: 1.25 mg via INTRAVENOUS
  Filled 2019-08-12: qty 2

## 2019-08-12 MED ORDER — SODIUM CHLORIDE (PF) 0.9 % IJ SOLN
INTRAMUSCULAR | Status: AC
  Filled 2019-08-12: qty 50

## 2019-08-12 MED ORDER — IOHEXOL 300 MG/ML  SOLN
100.0000 mL | Freq: Once | INTRAMUSCULAR | Status: AC | PRN
  Administered 2019-08-12: 100 mL via INTRAVENOUS

## 2019-08-12 MED ORDER — SODIUM CHLORIDE 0.9 % IV BOLUS
1000.0000 mL | Freq: Once | INTRAVENOUS | Status: AC
  Administered 2019-08-12: 1000 mL via INTRAVENOUS

## 2019-08-12 MED ORDER — LIDOCAINE VISCOUS HCL 2 % MT SOLN
15.0000 mL | Freq: Once | OROMUCOSAL | Status: AC
  Administered 2019-08-12: 15 mL via ORAL
  Filled 2019-08-12: qty 15

## 2019-08-12 MED ORDER — DIPHENHYDRAMINE HCL 50 MG/ML IJ SOLN
25.0000 mg | Freq: Once | INTRAMUSCULAR | Status: AC
  Administered 2019-08-12: 25 mg via INTRAVENOUS
  Filled 2019-08-12: qty 1

## 2019-08-12 MED ORDER — PANTOPRAZOLE SODIUM 20 MG PO TBEC
20.0000 mg | DELAYED_RELEASE_TABLET | Freq: Two times a day (BID) | ORAL | 0 refills | Status: DC
Start: 2019-08-12 — End: 2019-08-25

## 2019-08-12 MED ORDER — PANTOPRAZOLE SODIUM 40 MG IV SOLR
40.0000 mg | Freq: Once | INTRAVENOUS | Status: AC
  Administered 2019-08-12: 40 mg via INTRAVENOUS
  Filled 2019-08-12: qty 40

## 2019-08-12 NOTE — ED Triage Notes (Signed)
Pt reports "when I burp it smells like eggs".

## 2019-08-12 NOTE — Discharge Instructions (Signed)
Try to avoid things that may make this worse, most commonly these are spicy foods tomato based products fatty foods chocolate and peppermint.  Alcohol and tobacco can also make this worse.  Return to the emergency department for sudden worsening pain fever or inability to eat or drink.  Take either the Protonix or the omeprazole.  Please call your family doctor on Monday and discuss your visit here see if they want you to see a gastroenterologist to have a endoscopy performed if you are continue to have symptoms despite your use of a PPI.

## 2019-08-12 NOTE — ED Provider Notes (Signed)
[facility] DEPT Provider Note   CSN: [id number] Arrival date & time: 08/12/19  1157     History Chief Complaint  Patient presents with  . Abdominal Pain  . Diarrhea    Tammy Strickland is a 59 y.o. female medical history significant for arthritis, CHF, COPD, CAD, type 2 diabetes, hypertension, CVA, lupus presents to emergency department today with chief complaint of progressively worsening abdominal pain and diarrhea x 1 week. She states her abdominal pain is constant.  It is located in her epigastric area and radiates to her right upper quadrant and back pain.  She states the pain is sharp and severe.  She rates the pain 10 out of 10 in severity.  She she is endorsing nausea without emesis.  She is also having nonbloody diarrhea.  She has had 3 episodes of diarrhea today.  She takes Percocet for chronic pain and states it is not helping her abdominal pain. She also reports her frequent burping. She admits to chest pain and sore throat after burping. She describes the pain as burning sensation in her chest. No chest pain currently. She admits to drinking to mixed drinks x 2 weeks ago at a birthday celebration.  She denies fever, chills, shortness of breath, wheezing, nausea, diaphoresis, lower extremity edema, urinary symptoms, black tarry stool. Abdominal surgical history includes cholecystectomy and appendectomy.     Past Medical History:  Diagnosis Date  . Anxiety   . Arthritis   . CHF (congestive heart failure) ([place])   . COPD (chronic obstructive pulmonary disease) ([place])   . Coronary artery disease   . Depression   . Diabetes mellitus without complication ([place])    type 2   . GERD (gastroesophageal reflux disease)   . Hypertension   . Lupus ([place])   . Migraines   . Neuromuscular disorder ([place])   . Osteoporosis   . Oxygen deficiency    pt uses 2.5L 02 at night   . Peripheral neuropathy   . Shingles   . Sleep apnea    had sleep study done recently ;  unaware if she will be getting  a CPAP device ; patient states "im pretty sure i have it , i fall alseep all the time "  . Stroke [facility]) 10/2015    Patient Active Problem List   Diagnosis Date Noted  . Pancreatitis 05/27/2019  . Severe obesity (BMI >= 40) ([place]) 05/24/2019  . TMJ arthralgia 04/26/2019  . Sore throat 04/26/2019  . Unilateral primary osteoarthritis, right knee 01/16/2019  . Vaginal yeast infection 11/01/2018  . Anxiety 11/01/2018  . RUQ pain 06/22/2018  . Fatty liver 06/19/2018  . Lactic acid acidosis 06/15/2018  . Transaminitis 06/15/2018  . Chronic bilateral low back pain without sciatica 05/17/2018  . Body mass index 50.0-59.9, adult ([place]) 05/17/2018  . It band syndrome, left 12/29/2017  . Trochanteric bursitis, left hip 12/29/2017  . Dysphagia 04/29/2017  . History of left knee replacement 04/21/2017  . Pain in left ankle and joints of left foot 04/21/2017  . Pain in left hand 04/21/2017  . OSA (obstructive sleep apnea) 02/23/2017  . Acute gouty arthritis 12/25/2016  . Stroke ([place])   . Chronic fatigue 11/09/2016  . Hypoxia 11/09/2016  . Morbidly obese ([place]) 11/09/2016  . Rash and nonspecific skin eruption 07/21/2016  . Frequent headaches 07/21/2016  . Allergic rhinitis 07/16/2016  . Vitamin D deficiency 04/16/2016  . Hyperlipidemia 01/15/2016  . Diabetes ([place]) 11/26/2015  . Chronic diastolic congestive heart  failure ([place]) 11/06/2015  . TIA (transient ischemic attack)   . Carotid-cavernous fistula   . Insomnia 10/10/2015  . Depression 10/10/2015  . Benign essential HTN 08/15/2015  . Fibromyalgia 08/15/2015  . Peripheral neuropathy 08/15/2015  . CAD (coronary artery disease) 08/15/2015  . COPD (chronic obstructive pulmonary disease) ([place]) 08/15/2015  . Osteoporosis 08/15/2015  . 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 DDD (degenerative disc disease), lumbar 08/15/2015  . Occupational exposure to industrial toxins 08/15/2015  . GERD (gastroesophageal reflux  disease) 08/15/2015    Past Surgical History:  Procedure Laterality Date  . ABDOMINAL HYSTERECTOMY     ovaries left  . APPENDECTOMY    . BREAST LUMPECTOMY WITH RADIOACTIVE SEED LOCALIZATION Left 06/14/2019   Procedure: LEFT BREAST LUMPECTOMY WITH RADIOACTIVE SEED LOCALIZATION X 2;  Surgeon: Coralie Keens, MD;  Location: [facility];  Service: General;  Laterality: Left;  LMA  . CHOLECYSTECTOMY    . IR GENERIC HISTORICAL  11/07/2015   IR ANGIO INTRA EXTRACRAN SEL COM CAROTID INNOMINATE BILAT MOD SED 11/07/2015 Luanne Bras, MD [facility]  . IR GENERIC HISTORICAL  11/07/2015   IR ANGIO VERTEBRAL SEL SUBCLAVIAN INNOMINATE UNI R MOD SED 11/07/2015 Luanne Bras, MD [facility]  . IR GENERIC HISTORICAL  11/07/2015   IR ANGIO VERTEBRAL SEL VERTEBRAL UNI L MOD SED 11/07/2015 Luanne Bras, MD [facility]  . IR GENERIC HISTORICAL  11/07/2015   IR ANGIOGRAM EXTREMITY LEFT 11/07/2015 Luanne Bras, MD [facility]  . TONSILLECTOMY    . TOTAL KNEE ARTHROPLASTY Left 03/12/2017   Procedure: LEFT TOTAL KNEE ARTHROPLASTY;  Surgeon: Mcarthur Rossetti, MD;  Location: [facility];  Service: Orthopedics;  Laterality: Left;  Adductor Block     OB History    Gravida  3   Para  3   Term  3   Preterm      AB      Living  3     SAB      TAB      Ectopic      Multiple      Live Births  3           Family History  Problem Relation Age of Onset  . Hyperlipidemia Mother   . Hypertension Mother   . Stroke Mother   . Thyroid disease Mother   . Heart attack Mother   . Hyperlipidemia Father   . Hypertension Father   . Stroke Father   . Heart attack Father   . Hypertension Sister   . Cancer Sister        breast cancer  . Stroke Sister   . Thyroid disease Sister   . Crohn's disease Sister   . Hypertension Sister   . Hypertension Brother   . Diabetes Brother   . Hypertension Brother     Social History   Tobacco Use  . Smoking status: Current Every Day Smoker     Packs/day: 0.25    Years: 35.00    Pack years: 8.75    Types: Cigarettes  . Smokeless tobacco: Never Used  . Tobacco comment: referred  to smoking  cessation  classes. at  [id number]   Substance Use Topics  . Alcohol use: No  . Drug use: No    Home Medications Prior to Admission medications   Medication Sig Start Date End Date Taking? Authorizing Provider  amitriptyline (ELAVIL) 75 MG tablet Take 1 tablet by mouth at bedtime Patient taking differently: Take 75 mg by mouth at bedtime.  07/10/19  Yes Burns, Claudina Lick, MD  aspirin EC 81 MG tablet Take 1 tablet (81 mg total) by mouth daily. 11/01/15  Yes Elgergawy, Silver Huguenin, MD  cloNIDine (CATAPRES) 0.3 MG tablet Take 1 tablet by mouth every day Patient taking differently: Take 0.3 mg by mouth daily.  04/25/19  Yes Burns, Claudina Lick, MD  fluticasone (FLONASE) 50 MCG/ACT nasal spray Shake liquid & use 1 spray into each nostril every day as needed for allergies Patient taking differently: Place 1 spray into both nostrils daily as needed for allergies or rhinitis.  02/23/19  Yes Burns, Claudina Lick, MD  furosemide (LASIX) 80 MG tablet Take 1 tablet by mouth every day Patient taking differently: Take 80 mg by mouth daily.  04/25/19  Yes Burns, Claudina Lick, MD  gabapentin (NEURONTIN) 600 MG tablet Take 2 tablets by mouth 3 times a day Patient taking differently: Take 1,200 mg by mouth 2 (two) times daily.  02/23/19  Yes Burns, Claudina Lick, MD  glipiZIDE (GLUCOTROL) 5 MG tablet Take 1 tablet (5 mg total) by mouth daily before breakfast. 04/26/19  Yes Burns, Claudina Lick, MD  losartan (COZAAR) 50 MG tablet Take 1 tablet by mouth twice daily Patient taking differently: Take 50 mg by mouth daily.  04/25/19  Yes Burns, Claudina Lick, MD  metoprolol (TOPROL-XL) 200 MG 24 hr tablet Take 1 tablet by mouth twice daily Patient taking differently: Take 200 mg by mouth in the morning and at bedtime.  04/25/19  Yes Burns, Claudina Lick, MD  omeprazole (PRILOSEC) 40 MG capsule Take 1 capsule (40 mg  total) by mouth 2 (two) times daily before a meal. 05/24/19  Yes Burns, Claudina Lick, MD  OVER THE COUNTER MEDICATION Take 1 tablet by mouth at bedtime as needed (sleep). Sleep Aid   Yes [provider]  oxyCODONE-acetaminophen (PERCOCET) 10-325 MG tablet Take 1 tablet by mouth every 4 (four) hours as needed for pain.  01/31/18  Yes [provider]  OZEMPIC, 1 MG/DOSE, 2 MG/1.5ML SOPN Inject 1 mg subcutaneously once a week. Patient taking differently: Inject 1 mg into the skin once a week.  03/13/19  Yes Burns, Claudina Lick, MD  potassium chloride SA (KLOR-CON) 20 MEQ tablet Take 1 tablet by mouth every day Patient taking differently: Take 20 mEq by mouth daily.  04/25/19  Yes Burns, Claudina Lick, MD  SYMBICORT 80-4.5 MCG/ACT inhaler Inhale 2 puffs into the lungs daily as needed Patient taking differently: Inhale 2 puffs into the lungs daily as needed (wheezing/SOB).  03/13/19  Yes Burns, Claudina Lick, MD  tiZANidine (ZANAFLEX) 4 MG tablet Take 1 tablet by mouth at bedtime Patient taking differently: Take 4 mg by mouth in the morning and at bedtime.  06/08/19  Yes Burns, Claudina Lick, MD  atorvastatin (LIPITOR) 20 MG tablet Take 1 tablet by mouth every day Patient not taking: Reported on 08/12/2019 03/13/19   Binnie Rail, MD  glucose blood test strip Use as instructed to check sugar three times a day 01/28/17   Binnie Rail, MD  Lancets MISC Use as directed twice per day 01/28/17   Binnie Rail, MD  mupirocin ointment (BACTROBAN) 2 % Apply topically 2 (two) times daily. Patient not taking: Reported on 08/12/2019 05/29/19   Georgette Shell, MD  nitroGLYCERIN (NITROSTAT) 0.4 MG SL tablet Place 0.4 mg under the tongue every 5 (five) minutes as needed for chest pain.    [provider]  oxyCODONE (OXY IR/ROXICODONE) 5 MG immediate release  tablet Take 1 tablet (5 mg total) by mouth every 6 (six) hours as needed for moderate pain or severe pain. Patient not taking: Reported on 08/12/2019 06/14/19    Coralie Keens, MD  traZODone (DESYREL) 50 MG tablet Take 1 tablet (50 mg total) by mouth at bedtime as needed. for sleep Patient not taking: Reported on 08/12/2019 05/12/19   Binnie Rail, MD    Allergies    Metformin and related and Bacitracin  Review of Systems   Review of Systems All other systems are reviewed and are negative for acute change except as noted in the HPI.  Physical Exam Updated Vital Signs BP (!) 138/92 (BP Location: Right Leg)   Pulse 76   Temp 98 F (36.7 C) (Oral)   Resp 18   SpO2 97%   Physical Exam Vitals and nursing note reviewed.  Constitutional:      General: She is not in acute distress.    Appearance: She is obese. She is not ill-appearing.  HENT:     Head: Normocephalic and atraumatic.     Right Ear: Tympanic membrane and external ear normal.     Left Ear: Tympanic membrane and external ear normal.     Nose: Nose normal.     Mouth/Throat:     Mouth: Mucous membranes are dry.     Pharynx: Oropharynx is clear.  Eyes:     General: No scleral icterus.       Right eye: No discharge.        Left eye: No discharge.     Extraocular Movements: Extraocular movements intact.     Conjunctiva/sclera: Conjunctivae normal.     Pupils: Pupils are equal, round, and reactive to light.  Neck:     Vascular: No JVD.  Cardiovascular:     Rate and Rhythm: Normal rate and regular rhythm.     Pulses: Normal pulses.          Radial pulses are 2+ on the right side and 2+ on the left side.     Heart sounds: Normal heart sounds.  Pulmonary:     Comments: Lungs clear to auscultation in all fields. Symmetric chest rise. No wheezing, rales, or rhonchi. Chest:     Chest wall: No tenderness.  Abdominal:     Palpations: There is no mass.     Hernia: No hernia is present.     Comments: Abdomen is soft, non-distended. Tenderness to palpation of epigastric area and RUQ, No rigidity, no guarding. No peritoneal signs. No CVA tenderness.  Musculoskeletal:         General: Normal range of motion.     Cervical back: Normal range of motion.     Right lower leg: No edema.     Left lower leg: No edema.  Skin:    General: Skin is warm and dry.     Capillary Refill: Capillary refill takes less than 2 seconds.  Neurological:     Mental Status: She is oriented to person, place, and time.     GCS: GCS eye subscore is 4. GCS verbal subscore is 5. GCS motor subscore is 6.     Comments: Fluent speech, no facial droop.  Psychiatric:        Behavior: Behavior normal.     ED Results / Procedures / Treatments   Labs (all labs ordered are listed, but only abnormal results are displayed) Labs Reviewed  CBC - Abnormal; Notable for the following components:  Result Value   RBC 5.27 (*)    Hemoglobin 15.4 (*)    HCT 47.3 (*)    All other components within normal limits  COMPREHENSIVE METABOLIC PANEL - Abnormal; Notable for the following components:   Glucose, Bld 164 (*)    Calcium 8.8 (*)    AST 100 (*)    ALT 62 (*)    All other components within normal limits  LIPASE, BLOOD  URINALYSIS, ROUTINE W REFLEX MICROSCOPIC    EKG None  Radiology No results found.  Procedures Procedures (including critical care time)  Medications Ordered in ED Medications  sodium chloride (PF) 0.9 % injection (has no administration in time range)  morphine 4 MG/ML injection 4 mg (has no administration in time range)  sodium chloride flush (NS) 0.9 % injection 3 mL (3 mLs Intravenous Given 08/12/19 1624)  sodium chloride 0.9 % bolus 1,000 mL (1,000 mLs Intravenous New Bag/Given 08/12/19 1623)  pantoprazole (PROTONIX) injection 40 mg (40 mg Intravenous Given 08/12/19 1623)  ondansetron (ZOFRAN) injection 4 mg (4 mg Intravenous Given 08/12/19 1623)  alum & mag hydroxide-simeth (MAALOX/MYLANTA) 200-200-20 MG/5ML suspension 30 mL (30 mLs Oral Given 08/12/19 1639)    And  lidocaine (XYLOCAINE) 2 % viscous mouth solution 15 mL (15 mLs Oral Given 08/12/19 1639)  iohexol  (OMNIPAQUE) 300 MG/ML solution 100 mL (100 mLs Intravenous Contrast Given 08/12/19 1730)    ED Course  I have reviewed the triage vital signs and the nursing notes.  Pertinent labs & imaging results that were available during my care of the patient were reviewed by me and considered in my medical decision making (see chart for details).  Vitals:   08/12/19 1203 08/12/19 1523  BP: (!) 179/155 (!) 138/92  Pulse: 73 76  Resp: 16 18  Temp: 98 F (36.7 C)   TempSrc: Oral   SpO2: 93% 97%      MDM Rules/Calculators/A&P                      History provided by patient with additional history obtained from chart review.    Patient presents to the ED with complaints of abdominal pain. Patient nontoxic appearing, in no apparent distress.  Patient was noted to have blood pressure of 179/155 in triage, blood pressure was rechecked when patient first arrived in exam room and is 138/192.  I question the accuracy of the pressure documented in triage and am reassured by the rechecked pressure.  On exam patient tender to epigastric and right upper quadrant area, no peritoneal signs. Will evaluate with labs and CT A/P given the severity of her pain. She appears dehydrated, mucus membranes are dry.  Analgesics, anti-emetics, and fluids administered.   Labs collected in triage. I viewed results. No leukocytosis. CBC is hemoconcentrated suggestive of dehydration. Lipase is within normal range. She does have mildly elevated liver enzymes with AST 100 and ALT of 62 . Urinalysis without obvious infection.   Patient care transferred to Dr. Tyrone Nine at the end of my shift pending CT A/P, EKG and PO challenge. Patient presentation, ED course, and plan of care discussed with review of all pertinent labs and imaging. Please see his note for further details regarding further ED course and disposition.   Final Clinical Impression(s) / ED Diagnoses Final diagnoses:  None    Rx / DC Orders ED Discharge Orders     None       Cherre Robins, PA-C 08/12/19 1805  Deno Etienne, DO 08/12/19 2011

## 2019-08-12 NOTE — ED Triage Notes (Signed)
Per [facility] pt from home for RUQ pain and diarrhea and dry heaves x week. Was seen here before for pancreatitis. And feels the same but pain worse as well as acid reflux worse.  150palpated BP, 76HR, 92% CBG 123,

## 2019-08-14 ENCOUNTER — Emergency Department (HOSPITAL_COMMUNITY)
Admission: EM | Admit: 2019-08-14 | Discharge: 2019-08-14 | Disposition: A | Discharge status: Home / Self Care | Payer: Medicare Other | Attending: Emergency Medicine | Admitting: Emergency Medicine

## 2019-08-14 ENCOUNTER — Encounter: Payer: Self-pay | Admitting: Nurse Practitioner

## 2019-08-14 ENCOUNTER — Encounter (HOSPITAL_COMMUNITY): Payer: Self-pay | Admitting: Emergency Medicine

## 2019-08-14 DIAGNOSIS — R109 Unspecified abdominal pain: Secondary | ICD-10-CM | POA: Diagnosis present

## 2019-08-14 DIAGNOSIS — Z79899 Other long term (current) drug therapy: Secondary | ICD-10-CM | POA: Insufficient documentation

## 2019-08-14 DIAGNOSIS — Z7982 Long term (current) use of aspirin: Secondary | ICD-10-CM | POA: Insufficient documentation

## 2019-08-14 DIAGNOSIS — R748 Abnormal levels of other serum enzymes: Secondary | ICD-10-CM | POA: Diagnosis not present

## 2019-08-14 DIAGNOSIS — Z8673 Personal history of transient ischemic attack (TIA), and cerebral infarction without residual deficits: Secondary | ICD-10-CM | POA: Insufficient documentation

## 2019-08-14 DIAGNOSIS — E1140 Type 2 diabetes mellitus with diabetic neuropathy, unspecified: Secondary | ICD-10-CM | POA: Insufficient documentation

## 2019-08-14 DIAGNOSIS — R112 Nausea with vomiting, unspecified: Secondary | ICD-10-CM | POA: Diagnosis not present

## 2019-08-14 DIAGNOSIS — Z7984 Long term (current) use of oral hypoglycemic drugs: Secondary | ICD-10-CM | POA: Insufficient documentation

## 2019-08-14 DIAGNOSIS — R1084 Generalized abdominal pain: Secondary | ICD-10-CM | POA: Diagnosis not present

## 2019-08-14 DIAGNOSIS — R1011 Right upper quadrant pain: Secondary | ICD-10-CM | POA: Diagnosis not present

## 2019-08-14 DIAGNOSIS — I5032 Chronic diastolic (congestive) heart failure: Secondary | ICD-10-CM | POA: Diagnosis not present

## 2019-08-14 DIAGNOSIS — J449 Chronic obstructive pulmonary disease, unspecified: Secondary | ICD-10-CM | POA: Insufficient documentation

## 2019-08-14 DIAGNOSIS — Z96652 Presence of left artificial knee joint: Secondary | ICD-10-CM | POA: Insufficient documentation

## 2019-08-14 DIAGNOSIS — I110 Hypertensive heart disease with heart failure: Secondary | ICD-10-CM | POA: Insufficient documentation

## 2019-08-14 DIAGNOSIS — F17210 Nicotine dependence, cigarettes, uncomplicated: Secondary | ICD-10-CM | POA: Insufficient documentation

## 2019-08-14 DIAGNOSIS — Z743 Need for continuous supervision: Secondary | ICD-10-CM | POA: Diagnosis not present

## 2019-08-14 DIAGNOSIS — R945 Abnormal results of liver function studies: Secondary | ICD-10-CM | POA: Diagnosis not present

## 2019-08-14 DIAGNOSIS — I959 Hypotension, unspecified: Secondary | ICD-10-CM | POA: Diagnosis not present

## 2019-08-14 LAB — COMPREHENSIVE METABOLIC PANEL
ALT: 61 U/L — ABNORMAL HIGH (ref 0–44)
AST: 95 U/L — ABNORMAL HIGH (ref 15–41)
Albumin: 3.4 g/dL — ABNORMAL LOW (ref 3.5–5.0)
Alkaline Phosphatase: 111 U/L (ref 38–126)
Anion gap: 9 (ref 5–15)
BUN: 5 mg/dL — ABNORMAL LOW (ref 6–20)
CO2: 23 mmol/L (ref 22–32)
Calcium: 8.7 mg/dL — ABNORMAL LOW (ref 8.9–10.3)
Chloride: 107 mmol/L (ref 98–111)
Creatinine, Ser: 0.71 mg/dL (ref 0.44–1.00)
GFR calc Af Amer: >60 mL/min (ref >60)
GFR calc non Af Amer: >60 mL/min (ref >60)
Glucose, Bld: 132 mg/dL — ABNORMAL HIGH (ref 70–99)
Potassium: 3.5 mmol/L (ref 3.5–5.1)
Sodium: 139 mmol/L (ref 135–145)
Total Bilirubin: 0.5 mg/dL (ref 0.3–1.2)
Total Protein: 7.5 g/dL (ref 6.5–8.1)

## 2019-08-14 LAB — CBC WITH DIFFERENTIAL/PLATELET
Abs Immature Granulocytes: 0.02 10*3/uL (ref 0.00–0.07)
Basophils Absolute: 0.1 10*3/uL (ref 0.0–0.1)
Basophils Relative: 2 %
Eosinophils Absolute: 0.3 10*3/uL (ref 0.0–0.5)
Eosinophils Relative: 5 %
HCT: 42.4 % (ref 36.0–46.0)
Hemoglobin: 14.1 g/dL (ref 12.0–15.0)
Immature Granulocytes: 0 %
Lymphocytes Relative: 38 %
Lymphs Abs: 2 10*3/uL (ref 0.7–4.0)
MCH: 29.5 pg (ref 26.0–34.0)
MCHC: 33.3 g/dL (ref 30.0–36.0)
MCV: 88.7 fL (ref 80.0–100.0)
Monocytes Absolute: 0.4 10*3/uL (ref 0.1–1.0)
Monocytes Relative: 7 %
Neutro Abs: 2.5 10*3/uL (ref 1.7–7.7)
Neutrophils Relative %: 48 %
Platelets: 240 10*3/uL (ref 150–400)
RBC: 4.78 MIL/uL (ref 3.87–5.11)
RDW: 14.5 % (ref 11.5–15.5)
WBC: 5.2 10*3/uL (ref 4.0–10.5)
nRBC: 0 % (ref 0.0–0.2)

## 2019-08-14 LAB — LIPASE, BLOOD: Lipase: 22 U/L (ref 11–51)

## 2019-08-14 LAB — URINALYSIS, ROUTINE W REFLEX MICROSCOPIC
Bilirubin Urine: NEGATIVE
Glucose, UA: NEGATIVE mg/dL
Hgb urine dipstick: NEGATIVE
Ketones, ur: NEGATIVE mg/dL
Leukocytes,Ua: NEGATIVE
Nitrite: NEGATIVE
Protein, ur: NEGATIVE mg/dL
Specific Gravity, Urine: 1.018 (ref 1.005–1.030)
pH: 5 (ref 5.0–8.0)

## 2019-08-14 MED ORDER — LIDOCAINE VISCOUS HCL 2 % MT SOLN
15.0000 mL | Freq: Once | OROMUCOSAL | Status: AC
  Administered 2019-08-14: 15 mL via ORAL
  Filled 2019-08-14: qty 15

## 2019-08-14 MED ORDER — ALUM & MAG HYDROXIDE-SIMETH 200-200-20 MG/5ML PO SUSP
30.0000 mL | Freq: Once | ORAL | Status: AC
  Administered 2019-08-14: 30 mL via ORAL
  Filled 2019-08-14: qty 30

## 2019-08-14 MED ORDER — ONDANSETRON 4 MG PO TBDP
8.0000 mg | ORAL_TABLET | Freq: Once | ORAL | Status: AC
  Administered 2019-08-14: 8 mg via ORAL
  Filled 2019-08-14: qty 2

## 2019-08-14 MED ORDER — ONDANSETRON 8 MG PO TBDP
8.0000 mg | ORAL_TABLET | Freq: Three times a day (TID) | ORAL | 0 refills | Status: DC | PRN
Start: 2019-08-14 — End: 2019-09-19

## 2019-08-14 NOTE — Progress Notes (Signed)
Virtual Visit via Video Note  I connected with Tammy Strickland on 08/15/19 at  3:15 PM EDT by a video enabled telemedicine application and verified that I am speaking with the correct person using two identifiers.   I discussed the limitations of evaluation and management by telemedicine and the availability of in person appointments. The patient expressed understanding and agreed to proceed.  Present for the visit:  Myself, Dr Billey Gosling, Tammy Strickland.  The patient is currently at home and I am in the office.    No referring provider.    History of Present Illness: This is an acute visit for diarrhea nad follow up from the ED   Ed 5/1 for epigastric pain, nausea and vomiting.  Blood work and CT scan unremarkable.  Her omeprazole was changed to protonix.    ED 5/3 - abdominal pain x 1 week.  She was still having abdominal pain, nausea, vomiting.  She was vomiting 3 times a day - water.  Cbc, UA, lipase normal.  Her RUQ was tender on exam.  Her abdomen was soft.   Her GERD is better on the protonix.   She is still experiencing right upper quadrant pain.  She states the pain is constant.  She continues to have diarrhea.  She has it multiple times a day, may be 8-10 times a day.  There is no blood in the stool.  She has tried taking Imodium A-D and it does help until she eats something else and then she will have diarrhea.  She is afraid to eat too much because of the diarrhea.  She has not had any fevers.  She is still experiencing nausea and has used the antinausea medication.  She is trying to drink plenty of fluids.  She is concerned she may have Crohn's colitis like her twin sister.  She states she did have a colonoscopy a couple of years ago, but there is no record of that in the chart.  She states she had this at [facility].  She does have an appointment with GI 5/13.   Reviewed with her recent blood work and CT scan from the emergency room.  Review of Systems   Constitutional: Negative for fever and malaise/fatigue.  Respiratory: Negative for shortness of breath.   Cardiovascular: Negative for chest pain and palpitations.  Gastrointestinal: Positive for abdominal pain, diarrhea, heartburn, nausea and vomiting. Negative for blood in stool and melena.  Musculoskeletal: Positive for back pain, joint pain and myalgias.  Neurological: Positive for headaches.      Social History   Socioeconomic History  . Marital status: Single    Spouse name: n/a  . Number of children: 3  . Years of education: 12+  . Highest education level: Not on file  Occupational History  . 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 Smoking status: Current Every Day Smoker    Packs/day: 0.25    Years: 35.00    Pack years: 8.75    Types: Cigarettes  . Smokeless tobacco: Never Used  . Tobacco comment: referred  to smoking  cessation  classes. at  [facility]   Substance and Sexual Activity  . Alcohol use: No  . Drug use: No  . Sexual activity: Not Currently    Partners: Female    Birth control/protection: Surgical    Comment: hysterectomy  Other Topics Concern  . Not on file  Social History Narrative  Moved to [place] from [place], [place] February 2017, to help her daughter.   Lives with her daughter.   Sons live in [place] and [place].   She reports that there were originally 17 children in her family (she is the youngest), the oldest are deceased, some prior to her birth, and she isn't sure which were female/female or how they died.   Social Determinants of Health   Financial Resource Strain:   . Difficulty of Paying Living Expenses:   Food Insecurity:   . Worried About Charity fundraiser in the Last Year:   . Arboriculturist in the Last Year:   Transportation Needs:   . Film/video editor (Medical):   Marland Kitchen Lack of Transportation (Non-Medical):   Physical Activity:    . Days of Exercise per Week:   . Minutes of Exercise per Session:   Stress:   . Feeling of Stress :   Social Connections:   . Frequency of Communication with Friends and Family:   . Frequency of Social Gatherings with Friends and Family:   . Attends Religious Services:   . Active Member of Clubs or Organizations:   . Attends Archivist Meetings:   Marland Kitchen Marital Status:      Observations/Objective: Appears well in NAD Breathing normally Skin appears warm and dry  Assessment and Plan:  See Problem List for Assessment and Plan of chronic medical problems.   Follow Up Instructions:    I discussed the assessment and treatment plan with the patient. The patient was provided an opportunity to ask questions and all were answered. The patient agreed with the plan and demonstrated an understanding of the instructions.   The patient was advised to call back or seek an in-person evaluation if the symptoms worsen or if the condition fails to improve as anticipated.    Binnie Rail, MD

## 2019-08-14 NOTE — Discharge Instructions (Addendum)
Please use Zofran as needed for nausea Please began drinking clear liquids and advance your diet only as tolerated Call the gastroenterology today for follow-up appointment Please call your primary care doctor for reevaluation this week Return to the emergency department if you are unable to tolerate fluids or worse at any time

## 2019-08-14 NOTE — ED Triage Notes (Signed)
Pt back today with c/o abd pain , pt was seen 2 days ago with same complaint

## 2019-08-14 NOTE — ED Notes (Signed)
Patient Alert and oriented to baseline. Stable and ambulatory to baseline. Patient verbalized understanding of the discharge instructions.  Patient belongings were taken by the patient.   

## 2019-08-14 NOTE — ED Provider Notes (Signed)
[facility] EMERGENCY DEPARTMENT Provider Note   CSN: [id number] Arrival date & time: 08/14/19  [id number]     History No chief complaint on file.   Exie Heikkila is a 59 y.o. female.  HPI  59 year old female history of CHF, COPD, recent admission for pancreatitis, depression, type 2 diabetes, GERD, morbid obesity, stroke presents today complaining of ongoing abdominal pain for the past week.  She states that this is similar to the pain that she had when she was admitted for pancreatitis.  It has been present for a week and she was seen last Monday in the emergency department.  She reports that since that time, she has had ongoing pain, nausea, and vomiting.  She reports that she vomits water about 3 times a day.  She states she has not been eating or drinking since that time.  She denies alcohol use.  She is a smoker but has not smoked over the past week.  She denies any headache, head injury, chest pain, cough, fever, chills, UTI symptoms.     Past Medical History:  Diagnosis Date  . Anxiety   . Arthritis   . CHF (congestive heart failure) ([place])   . COPD (chronic obstructive pulmonary disease) ([place])   . Coronary artery disease   . Depression   . Diabetes mellitus without complication ([place])    type 2   . GERD (gastroesophageal reflux disease)   . Hypertension   . Lupus ([place])   . Migraines   . Neuromuscular disorder ([place])   . Osteoporosis   . Oxygen deficiency    pt uses 2.5L 02 at night   . Peripheral neuropathy   . Shingles   . Sleep apnea    had sleep study done recently ; unaware if she will be getting  a CPAP device ; patient states "im pretty sure i have it , i fall alseep all the time "  . Stroke [facility]) 10/2015    Patient Active Problem List   Diagnosis Date Noted  . Pancreatitis 05/27/2019  . Severe obesity (BMI >= 40) ([place]) 05/24/2019  . TMJ arthralgia 04/26/2019  . Sore throat 04/26/2019  . Unilateral primary osteoarthritis, right knee 01/16/2019   . Vaginal yeast infection 11/01/2018  . Anxiety 11/01/2018  . RUQ pain 06/22/2018  . Fatty liver 06/19/2018  . Lactic acid acidosis 06/15/2018  . Transaminitis 06/15/2018  . Chronic bilateral low back pain without sciatica 05/17/2018  . Body mass index 50.0-59.9, adult ([place]) 05/17/2018  . It band syndrome, left 12/29/2017  . Trochanteric bursitis, left hip 12/29/2017  . Dysphagia 04/29/2017  . History of left knee replacement 04/21/2017  . Pain in left ankle and joints of left foot 04/21/2017  . Pain in left hand 04/21/2017  . OSA (obstructive sleep apnea) 02/23/2017  . Acute gouty arthritis 12/25/2016  . Stroke ([place])   . Chronic fatigue 11/09/2016  . Hypoxia 11/09/2016  . Morbidly obese ([place]) 11/09/2016  . Rash and nonspecific skin eruption 07/21/2016  . Frequent headaches 07/21/2016  . Allergic rhinitis 07/16/2016  . Vitamin D deficiency 04/16/2016  . Hyperlipidemia 01/15/2016  . Diabetes ([place]) 11/26/2015  . Chronic diastolic congestive heart failure ([place]) 11/06/2015  . TIA (transient ischemic attack)   . Carotid-cavernous fistula   . Insomnia 10/10/2015  . Depression 10/10/2015  . Benign essential HTN 08/15/2015  . Fibromyalgia 08/15/2015  . Peripheral neuropathy 08/15/2015  . CAD (coronary artery disease) 08/15/2015  . COPD (chronic obstructive pulmonary disease) ([place]) 08/15/2015  .  Osteoporosis 08/15/2015  . 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 DDD (degenerative disc disease), lumbar 08/15/2015  . Occupational exposure to industrial toxins 08/15/2015  . GERD (gastroesophageal reflux disease) 08/15/2015    Past Surgical History:  Procedure Laterality Date  . ABDOMINAL HYSTERECTOMY     ovaries left  . APPENDECTOMY    . BREAST LUMPECTOMY WITH RADIOACTIVE SEED LOCALIZATION Left 06/14/2019   Procedure: LEFT BREAST LUMPECTOMY WITH RADIOACTIVE SEED LOCALIZATION X 2;  Surgeon: Coralie Keens, MD;  Location: [facility];  Service: General;  Laterality: Left;  LMA  . CHOLECYSTECTOMY     . IR GENERIC HISTORICAL  11/07/2015   IR ANGIO INTRA EXTRACRAN SEL COM CAROTID INNOMINATE BILAT MOD SED 11/07/2015 Luanne Bras, MD [facility]  . IR GENERIC HISTORICAL  11/07/2015   IR ANGIO VERTEBRAL SEL SUBCLAVIAN INNOMINATE UNI R MOD SED 11/07/2015 Luanne Bras, MD [facility]  . IR GENERIC HISTORICAL  11/07/2015   IR ANGIO VERTEBRAL SEL VERTEBRAL UNI L MOD SED 11/07/2015 Luanne Bras, MD [facility]  . IR GENERIC HISTORICAL  11/07/2015   IR ANGIOGRAM EXTREMITY LEFT 11/07/2015 Luanne Bras, MD [facility]  . TONSILLECTOMY    . TOTAL KNEE ARTHROPLASTY Left 03/12/2017   Procedure: LEFT TOTAL KNEE ARTHROPLASTY;  Surgeon: Mcarthur Rossetti, MD;  Location: [facility];  Service: Orthopedics;  Laterality: Left;  Adductor Block     OB History    Gravida  3   Para  3   Term  3   Preterm      AB      Living  3     SAB      TAB      Ectopic      Multiple      Live Births  3           Family History  Problem Relation Age of Onset  . Hyperlipidemia Mother   . Hypertension Mother   . Stroke Mother   . Thyroid disease Mother   . Heart attack Mother   . Hyperlipidemia Father   . Hypertension Father   . Stroke Father   . Heart attack Father   . Hypertension Sister   . Cancer Sister        breast cancer  . Stroke Sister   . Thyroid disease Sister   . Crohn's disease Sister   . Hypertension Sister   . Hypertension Brother   . Diabetes Brother   . Hypertension Brother     Social History   Tobacco Use  . Smoking status: Current Every Day Smoker    Packs/day: 0.25    Years: 35.00    Pack years: 8.75    Types: Cigarettes  . Smokeless tobacco: Never Used  . Tobacco comment: referred  to smoking  cessation  classes. at  [facility]   Substance Use Topics  . Alcohol use: No  . Drug use: No    Home Medications Prior to Admission medications   Medication Sig Start Date End Date Taking? Authorizing Provider  amitriptyline (ELAVIL) 75 MG  tablet Take 1 tablet by mouth at bedtime Patient taking differently: Take 75 mg by mouth at bedtime.  07/10/19   Binnie Rail, MD  aspirin EC 81 MG tablet Take 1 tablet (81 mg total) by mouth daily. 11/01/15   Elgergawy, Silver Huguenin, MD  atorvastatin (LIPITOR) 20 MG tablet Take 1 tablet by mouth every day Patient not taking: Reported on 08/12/2019 03/13/19   Binnie Rail, MD  cloNIDine (CATAPRES) 0.3 MG tablet Take 1 tablet by mouth every day Patient taking differently: Take 0.3 mg by mouth daily.  04/25/19   Binnie Rail, MD  fluticasone (FLONASE) 50 MCG/ACT nasal spray Shake liquid & use 1 spray into each nostril every day as needed for allergies Patient taking differently: Place 1 spray into both nostrils daily as needed for allergies or rhinitis.  02/23/19   Binnie Rail, MD  furosemide (LASIX) 80 MG tablet Take 1 tablet by mouth every day Patient taking differently: Take 80 mg by mouth daily.  04/25/19   Binnie Rail, MD  gabapentin (NEURONTIN) 600 MG tablet Take 2 tablets by mouth 3 times a day Patient taking differently: Take 1,200 mg by mouth 2 (two) times daily.  02/23/19   Binnie Rail, MD  glipiZIDE (GLUCOTROL) 5 MG tablet Take 1 tablet (5 mg total) by mouth daily before breakfast. 04/26/19   Burns, Claudina Lick, MD  glucose blood test strip Use as instructed to check sugar three times a day 01/28/17   Binnie Rail, MD  Lancets MISC Use as directed twice per day 01/28/17   Binnie Rail, MD  losartan (COZAAR) 50 MG tablet Take 1 tablet by mouth twice daily Patient taking differently: Take 50 mg by mouth daily.  04/25/19   Binnie Rail, MD  metoprolol (TOPROL-XL) 200 MG 24 hr tablet Take 1 tablet by mouth twice daily Patient taking differently: Take 200 mg by mouth in the morning and at bedtime.  04/25/19   Binnie Rail, MD  mupirocin ointment (BACTROBAN) 2 % Apply topically 2 (two) times daily. Patient not taking: Reported on 08/12/2019 05/29/19   Georgette Shell, MD    nitroGLYCERIN (NITROSTAT) 0.4 MG SL tablet Place 0.4 mg under the tongue every 5 (five) minutes as needed for chest pain.    [provider]  omeprazole (PRILOSEC) 40 MG capsule Take 1 capsule (40 mg total) by mouth 2 (two) times daily before a meal. 05/24/19   Burns, Claudina Lick, MD  OVER THE COUNTER MEDICATION Take 1 tablet by mouth at bedtime as needed (sleep). Sleep Aid    [provider]  oxyCODONE (OXY IR/ROXICODONE) 5 MG immediate release tablet Take 1 tablet (5 mg total) by mouth every 6 (six) hours as needed for moderate pain or severe pain. Patient not taking: Reported on 08/12/2019 06/14/19   Coralie Keens, MD  oxyCODONE-acetaminophen (PERCOCET) 10-325 MG tablet Take 1 tablet by mouth every 4 (four) hours as needed for pain.  01/31/18   [provider]  OZEMPIC, 1 MG/DOSE, 2 MG/1.5ML SOPN Inject 1 mg subcutaneously once a week. Patient taking differently: Inject 1 mg into the skin once a week.  03/13/19   Binnie Rail, MD  pantoprazole (PROTONIX) 20 MG tablet Take 1 tablet (20 mg total) by mouth 2 (two) times daily. 08/12/19   Deno Etienne, DO  potassium chloride SA (KLOR-CON) 20 MEQ tablet Take 1 tablet by mouth every day Patient taking differently: Take 20 mEq by mouth daily.  04/25/19   Binnie Rail, MD  SYMBICORT 80-4.5 MCG/ACT inhaler Inhale 2 puffs into the lungs daily as needed Patient taking differently: Inhale 2 puffs into the lungs daily as needed (wheezing/SOB).  03/13/19   Binnie Rail, MD  tiZANidine (ZANAFLEX) 4 MG tablet Take 1 tablet by mouth at bedtime Patient taking differently: Take 4 mg by mouth in the morning and at bedtime.  06/08/19   Billey Gosling  J, MD  traZODone (DESYREL) 50 MG tablet Take 1 tablet (50 mg total) by mouth at bedtime as needed. for sleep Patient not taking: Reported on 08/12/2019 05/12/19   Binnie Rail, MD    Allergies    Metformin and related and Bacitracin  Review of Systems   Review of Systems  Physical  Exam Updated Vital Signs BP 121/64 (BP Location: Left Arm)   Pulse 73   Temp 98.8 F (37.1 C) (Oral)   Resp 20   SpO2 97%   Physical Exam Vitals and nursing note reviewed.  Constitutional:      General: She is not in acute distress.    Appearance: She is well-developed. She is obese. She is not ill-appearing or diaphoretic.  HENT:     Head: Normocephalic and atraumatic.     Right Ear: External ear normal.     Left Ear: External ear normal.     Nose: Nose normal.  Eyes:     Conjunctiva/sclera: Conjunctivae normal.     Pupils: Pupils are equal, round, and reactive to light.  Cardiovascular:     Rate and Rhythm: Normal rate and regular rhythm.     Heart sounds: Normal heart sounds.  Pulmonary:     Effort: Pulmonary effort is normal.     Breath sounds: Normal breath sounds.  Abdominal:     General: Bowel sounds are normal. There is no distension.     Palpations: Abdomen is soft.     Tenderness: There is abdominal tenderness. There is no guarding.     Comments: Tenderness to palpation right upper quadrant with mild touching of skin Abdomen is soft and no rebound is noted throughout  Musculoskeletal:        General: Normal range of motion.     Cervical back: Normal range of motion and neck supple.  Skin:    General: Skin is warm and dry.  Neurological:     Mental Status: She is alert and oriented to person, place, and time.     Deep Tendon Reflexes: Reflexes are normal and symmetric.  Psychiatric:        Behavior: Behavior normal.        Thought Content: Thought content normal.        Judgment: Judgment normal.     ED Results / Procedures / Treatments   Labs (all labs ordered are listed, but only abnormal results are displayed) Labs Reviewed  COMPREHENSIVE METABOLIC PANEL - Abnormal; Notable for the following components:      Result Value   Glucose, Bld 132 (*)    BUN 5 (*)    Calcium 8.7 (*)    Albumin 3.4 (*)    AST 95 (*)    ALT 61 (*)    All other components  within normal limits  LIPASE, BLOOD  CBC WITH DIFFERENTIAL/PLATELET  URINALYSIS, ROUTINE W REFLEX MICROSCOPIC    EKG None  Radiology CT ABDOMEN PELVIS W CONTRAST  Result Date: 08/12/2019 CLINICAL DATA:  Epigastric pain. Right upper quadrant abdominal pain with diarrhea. History of pancreatitis. EXAM: CT ABDOMEN AND PELVIS WITH CONTRAST TECHNIQUE: Multidetector CT imaging of the abdomen and pelvis was performed using the standard protocol following bolus administration of intravenous contrast. CONTRAST:  179mL OMNIPAQUE IOHEXOL 300 MG/ML  SOLN COMPARISON:  May 27, 2019 FINDINGS: Lower chest: The lung bases are clear. The heart size is normal. Hepatobiliary: There is decreased hepatic attenuation suggestive of hepatic steatosis. Status post cholecystectomy.There is no biliary ductal dilation. Pancreas: Normal  contours without ductal dilatation. No peripancreatic fluid collection. Spleen: Unremarkable. Adrenals/Urinary Tract: --Adrenal glands: There is a small stable left adrenal nodule. --Right kidney/ureter: No hydronephrosis or radiopaque kidney stones. --Left kidney/ureter: No hydronephrosis or radiopaque kidney stones. --Urinary bladder: Unremarkable. Stomach/Bowel: --Stomach/Duodenum: No hiatal hernia or other gastric abnormality. Normal duodenal course and caliber. --Small bowel: Unremarkable. --Colon: There is scattered colonic diverticula without CT evidence for diverticulitis. --Appendix: Not visualized. No right lower quadrant inflammation or free fluid. Vascular/Lymphatic: Atherosclerotic calcification is present within the non-aneurysmal abdominal aorta, without hemodynamically significant stenosis. --No retroperitoneal lymphadenopathy. --No mesenteric lymphadenopathy. --No pelvic or inguinal lymphadenopathy. Reproductive: Status post hysterectomy. No adnexal mass. Other: No ascites or free air. The abdominal wall is normal. Musculoskeletal. No acute displaced fractures. IMPRESSION: 1. No  acute abdominopelvic abnormality. 2. Hepatic steatosis. 3. Scattered colonic diverticula without CT evidence for diverticulitis. Aortic Atherosclerosis (ICD10-I70.0). Electronically Signed   By: Constance Holster M.D.   On: 08/12/2019 18:08    Procedures Procedures (including critical care time)  Medications Ordered in ED Medications  alum & mag hydroxide-simeth (MAALOX/MYLANTA) 200-200-20 MG/5ML suspension 30 mL (has no administration in time range)    And  lidocaine (XYLOCAINE) 2 % viscous mouth solution 15 mL (has no administration in time range)  ondansetron (ZOFRAN-ODT) disintegrating tablet 8 mg (has no administration in time range)    ED Course  I have reviewed the triage vital signs and the nursing notes.  Pertinent labs & imaging results that were available during my care of the patient were reviewed by me and considered in my medical decision making (see chart for details).    MDM Rules/Calculators/A&P                     59 year old female history of pancreatitis several months ago, presents today complaining of ongoing abdominal pain, nausea, and vomiting.  She reports vomiting multiple times times a day over the past week.  Labs reveal no evidence of volume depletion or leukocytosis.  Lipase is normal.  She had a CT scan within the past several days with this episode that did not show any acute abnormality.  This was reviewed by myself.  Patient had a GI cocktail given here in the ED as well as Zofran 8 mg.  She has been tolerating liquids here with no difficulty.  Patient reexamined has mild tenderness in the right upper quadrant.  She has had her gallbladder removed previously.  She does have elevated liver enzymes which have been elevated over the past year.  I have discussed this with the patient and plan referral outpatient to GI.  Discussed return precautions need for close follow-up and she voices understanding. Final Clinical Impression(s) / ED Diagnoses Final diagnoses:   Right upper quadrant abdominal pain  Nausea and vomiting, intractability of vomiting not specified, unspecified vomiting type  Elevated liver enzymes    Rx / DC Orders ED Discharge Orders    None       Pattricia Boss, MD 08/14/19 1351

## 2019-08-15 ENCOUNTER — Telehealth (INDEPENDENT_AMBULATORY_CARE_PROVIDER_SITE_OTHER): Payer: Medicare Other | Admitting: Internal Medicine

## 2019-08-15 ENCOUNTER — Encounter: Payer: Self-pay | Admitting: Internal Medicine

## 2019-08-15 DIAGNOSIS — K219 Gastro-esophageal reflux disease without esophagitis: Secondary | ICD-10-CM | POA: Diagnosis not present

## 2019-08-15 DIAGNOSIS — R197 Diarrhea, unspecified: Secondary | ICD-10-CM | POA: Insufficient documentation

## 2019-08-15 DIAGNOSIS — R112 Nausea with vomiting, unspecified: Secondary | ICD-10-CM | POA: Insufficient documentation

## 2019-08-15 DIAGNOSIS — M8949 Other hypertrophic osteoarthropathy, multiple sites: Secondary | ICD-10-CM | POA: Diagnosis not present

## 2019-08-15 DIAGNOSIS — M159 Polyosteoarthritis, unspecified: Secondary | ICD-10-CM

## 2019-08-15 DIAGNOSIS — R1011 Right upper quadrant pain: Secondary | ICD-10-CM

## 2019-08-15 DIAGNOSIS — M5136 Other intervertebral disc degeneration, lumbar region: Secondary | ICD-10-CM | POA: Diagnosis not present

## 2019-08-15 NOTE — Assessment & Plan Note (Addendum)
Acute-started approximately 1 week ago She did have similar symptoms almost 1 year ago and was hospitalized at that time.  At that time a CT scan, ultrasound and MRCP was all negative Still having right upper quadrant pain-to see GI 5/13 Continue symptomatic treatment until that time

## 2019-08-15 NOTE — Assessment & Plan Note (Addendum)
Acute Started approximately 1 week ago and has been to the emergency room twice Having right upper quadrant pain, nausea/vomiting and diarrhea GERD was uncontrolled, but has improved with changing her medication Blood work and CT scan have not revealed a cause-no evidence of pancreatitis or biliary disease ?  Gastroenteritis Has an appointment with GI 5/13 Advised bland diet and to try to eat some food Will continue Imodium, which does help transiently and Zofran for nausea Advised her to hold the Ozempic to make sure that is not contributing-can restart if stopping it does not improve symptoms

## 2019-08-15 NOTE — Assessment & Plan Note (Signed)
Chronic Was uncontrolled Omeprazole 40 mg twice daily changed to pantoprazole 20 mg twice daily and that has improved her GERD Continue pantoprazole 20 mg twice daily

## 2019-08-15 NOTE — Assessment & Plan Note (Signed)
Acute She is still experiencing nausea, but no recent vomiting She is using the Zofran as needed-prescribed in the ED Possibly related to GERD or diarrhea Reviewed blood work, CT scan-no obvious cause, no evidence of pancreatitis Continue symptomatic treatment with Zofran Continue pantoprazole Continue increased fluids

## 2019-08-19 NOTE — Assessment & Plan Note (Signed)
Chronic back pain Currently following with pain management, but that practice will be closing and needs to establish with a new clinic Limited by chronic back and knee pain Taking gabapentin 1200 mg BID - I prescribe Tizanidine at bedtime - I prescribe Percocet 10-325 mg 1 tab Q 4 hr prn - prescribed by pain clinic

## 2019-08-19 NOTE — Addendum Note (Signed)
Addended by: Binnie Rail on: 08/19/2019 10:10 AM   Modules accepted: Orders

## 2019-08-24 ENCOUNTER — Ambulatory Visit: Payer: Medicare Other | Admitting: Nurse Practitioner

## 2019-08-24 ENCOUNTER — Telehealth: Payer: Self-pay

## 2019-08-24 DIAGNOSIS — R197 Diarrhea, unspecified: Secondary | ICD-10-CM

## 2019-08-24 NOTE — Telephone Encounter (Signed)
F/u  Call the patient for additional information   Reason : GI due to stomach issues

## 2019-08-24 NOTE — Progress Notes (Deleted)
08/24/2019 Tammy Strickland [id number] 1960-11-26   CHIEF COMPLAINT:   HISTORY OF PRESENT ILLNESS:  Tammy Strickland is a 59 year old female with a past medical history of anxiety, depression, hypertension, coronary artery disease, CHF, COPD, DM II, neuropathy, Lups, migraine headaches, osteoporosis, chronic back pain and sleep apnea. Past partial hysterectomy, cholecystectomy and appendectomy.  ? Colonoscopy   Left breast lumpectomy with radioactive seed implantation 06/14/2019 LEFT BREAST COMPLEX SCLEROSING LESION X 2  FINAL MICROSCOPIC DIAGNOSIS:   A. BREAST, LEFT UPPER, LUMPECTOMY:  - Benign breast parenchyma with previous procedure-related changes  - Negative for carcinoma   B. BREAST, LEFT INFERIOR, LUMPECTOMY:  - Benign breast parenchyma with previous procedure-related changes  - Negative for carcinoma   She presented to [facility] ED on 08/12/2019 with N/V, epigastric pain and diarrhea x 1 week. Labs and CTAP were normal. Omeprazole was switched to Pantoprazole 40mg  daily.   Abdominal/pelvic CT with contrast 08/12/2019: 1. No acute abdominopelvic abnormality. 2. Hepatic steatosis. 3. Scattered colonic diverticula without CT evidence for diverticulitis.   Past Medical History:  Diagnosis Date   Anxiety    Arthritis    CHF (congestive heart failure) (HCC)    COPD (chronic obstructive pulmonary disease) (HCC)    Coronary artery disease    Depression    Diabetes mellitus without complication (HCC)    type 2    GERD (gastroesophageal reflux disease)    Hypertension    Lupus (HCC)    Migraines    Neuromuscular disorder ([place])    Osteoporosis    Oxygen deficiency    pt uses 2.5L 02 at night    Peripheral neuropathy    Shingles    Sleep apnea    had sleep study done recently ; unaware if she will be getting  a CPAP device ; patient states "im pretty sure i have it , i fall alseep all the time "   Stroke ([place]) 10/2015   Past Surgical History:    Procedure Laterality Date   ABDOMINAL HYSTERECTOMY     ovaries left   APPENDECTOMY     BREAST LUMPECTOMY WITH RADIOACTIVE SEED LOCALIZATION Left 06/14/2019   Procedure: LEFT BREAST LUMPECTOMY WITH RADIOACTIVE SEED LOCALIZATION X 2;  Surgeon: Coralie Keens, MD;  Location: [place];  Service: General;  Laterality: Left;  LMA   CHOLECYSTECTOMY     IR GENERIC HISTORICAL  11/07/2015   IR ANGIO INTRA EXTRACRAN SEL COM CAROTID INNOMINATE BILAT MOD SED 11/07/2015 Luanne Bras, MD [facility]   IR GENERIC HISTORICAL  11/07/2015   IR ANGIO VERTEBRAL SEL SUBCLAVIAN INNOMINATE UNI R MOD SED 11/07/2015 Luanne Bras, MD [facility]   IR GENERIC HISTORICAL  11/07/2015   IR ANGIO VERTEBRAL SEL VERTEBRAL UNI L MOD SED 11/07/2015 Luanne Bras, MD [facility]   IR GENERIC HISTORICAL  11/07/2015   IR ANGIOGRAM EXTREMITY LEFT 11/07/2015 Luanne Bras, MD [facility]   TONSILLECTOMY     TOTAL KNEE ARTHROPLASTY Left 03/12/2017   Procedure: LEFT TOTAL KNEE ARTHROPLASTY;  Surgeon: Mcarthur Rossetti, MD;  [facility];  Service: Orthopedics;  Laterality: Left;  Adductor Block    Social History:  Family History:    reports that she has been smoking cigarettes. She has a 8.75 pack-year smoking history. She has never used smokeless tobacco. She reports that she does not drink alcohol or use drugs. family history includes Cancer in her sister; Crohn's disease in her sister; Diabetes in her brother; Heart attack in her father and  mother; Hyperlipidemia in her father and mother; Hypertension in her brother, brother, father, mother, sister, and sister; Stroke in her father, mother, and sister; Thyroid disease in her mother and sister. Allergies  Allergen Reactions   Metformin And Related Other (See Comments)    Lactic acidosis   Bacitracin Hives      Outpatient Encounter Medications as of 08/24/2019  Medication Sig   amitriptyline (ELAVIL) 75 MG tablet Take 1 tablet by  mouth at bedtime (Patient taking differently: Take 75 mg by mouth at bedtime. )   aspirin EC 81 MG tablet Take 1 tablet (81 mg total) by mouth daily.   atorvastatin (LIPITOR) 20 MG tablet Take 1 tablet by mouth every day (Patient not taking: Reported on 08/12/2019)   cloNIDine (CATAPRES) 0.3 MG tablet Take 1 tablet by mouth every day (Patient taking differently: Take 0.3 mg by mouth daily. )   fluticasone (FLONASE) 50 MCG/ACT nasal spray Shake liquid & use 1 spray into each nostril every day as needed for allergies (Patient taking differently: Place 1 spray into both nostrils daily as needed for allergies or rhinitis. )   furosemide (LASIX) 80 MG tablet Take 1 tablet by mouth every day (Patient taking differently: Take 80 mg by mouth daily. )   gabapentin (NEURONTIN) 600 MG tablet Take 2 tablets by mouth 3 times a day (Patient taking differently: Take 1,200 mg by mouth 2 (two) times daily. )   glipiZIDE (GLUCOTROL) 5 MG tablet Take 1 tablet (5 mg total) by mouth daily before breakfast.   glucose blood test strip Use as instructed to check sugar three times a day   Lancets MISC Use as directed twice per day   losartan (COZAAR) 50 MG tablet Take 1 tablet by mouth twice daily (Patient taking differently: Take 50 mg by mouth daily. )   metoprolol (TOPROL-XL) 200 MG 24 hr tablet Take 1 tablet by mouth twice daily (Patient taking differently: Take 200 mg by mouth in the morning and at bedtime. )   nitroGLYCERIN (NITROSTAT) 0.4 MG SL tablet Place 0.4 mg under the tongue every 5 (five) minutes as needed for chest pain.   ondansetron (ZOFRAN ODT) 8 MG disintegrating tablet Take 1 tablet (8 mg total) by mouth every 8 (eight) hours as needed for nausea or vomiting.   OVER THE COUNTER MEDICATION Take 1 tablet by mouth at bedtime as needed (sleep). Sleep Aid   oxyCODONE-acetaminophen (PERCOCET) 10-325 MG tablet Take 1 tablet by mouth every 4 (four) hours as needed for pain.    OZEMPIC, 1 MG/DOSE, 2  MG/1.5ML SOPN Inject 1 mg subcutaneously once a week. (Patient taking differently: Inject 1 mg into the skin once a week. Friday)   pantoprazole (PROTONIX) 20 MG tablet Take 1 tablet (20 mg total) by mouth 2 (two) times daily.   potassium chloride SA (KLOR-CON) 20 MEQ tablet Take 1 tablet by mouth every day (Patient taking differently: Take 20 mEq by mouth daily. )   SYMBICORT 80-4.5 MCG/ACT inhaler Inhale 2 puffs into the lungs daily as needed (Patient taking differently: Inhale 2 puffs into the lungs daily as needed (wheezing/SOB). )   tiZANidine (ZANAFLEX) 4 MG tablet Take 1 tablet by mouth at bedtime (Patient taking differently: Take 4 mg by mouth at bedtime. )   No facility-administered encounter medications on file as of 08/24/2019.     REVIEW OF SYSTEMS: All other systems reviewed and negative except where noted in the History of Present Illness.   PHYSICAL EXAM: There were  no vitals taken for this visit. General: Well developed ... in no acute distress. Head: Normocephalic and atraumatic. Eyes:  Sclerae non-icteric, conjunctive pink. Ears: Normal auditory acuity. Mouth: Dentition intact. No ulcers or lesions.  Neck: Supple, no lymphadenopathy or thyromegaly.  Lungs: Clear bilaterally to auscultation without wheezes, crackles or rhonchi. Heart: Regular rate and rhythm. No murmur, rub or gallop appreciated.  Abdomen: Soft, nontender, non distended. No masses. No hepatosplenomegaly. Normoactive bowel sounds x 4 quadrants.  Rectal:  Musculoskeletal: Symmetrical with no gross deformities. Skin: Warm and dry. No rash or lesions on visible extremities. Extremities: No edema. Neurological: Alert oriented x 4, no focal deficits.  Psychological:  Alert and cooperative. Normal mood and affect.  ASSESSMENT AND PLAN:    CC:  Binnie Rail, MD

## 2019-08-24 NOTE — Telephone Encounter (Signed)
Do you know what the referral is for?

## 2019-08-24 NOTE — Telephone Encounter (Signed)
Patient is requesting to speak with nurse about her symptoms states she can not eat etc

## 2019-08-24 NOTE — Telephone Encounter (Signed)
Spoke with the patient. She depends on county transportation. They picked her up late and she was 30+ minutes late for her appointment 08/24/19. She did call to let us know and asked if she could be seen later. She is having pain in her RUQ area and diarrhea.  Appointment scheduled for the patient tomorrow. She thanks me for the call. States she will tell the transportation people her appointment is 2:00pm. She is scheduled for 2:30pm.

## 2019-08-24 NOTE — Telephone Encounter (Signed)
New message    The patient needs referral to sent in today.     Referral : Dr. Carol Ada  Office # [phone]  Reason: GI

## 2019-08-25 ENCOUNTER — Ambulatory Visit (INDEPENDENT_AMBULATORY_CARE_PROVIDER_SITE_OTHER): Payer: Medicare Other | Admitting: Gastroenterology

## 2019-08-25 ENCOUNTER — Other Ambulatory Visit: Payer: Self-pay

## 2019-08-25 ENCOUNTER — Encounter: Payer: Self-pay | Admitting: Gastroenterology

## 2019-08-25 VITALS — BP 114/74 | HR 60 | Temp 98.0°F | Ht 65.75 in | Wt 299.0 lb

## 2019-08-25 DIAGNOSIS — Z01818 Encounter for other preprocedural examination: Secondary | ICD-10-CM

## 2019-08-25 DIAGNOSIS — R1013 Epigastric pain: Secondary | ICD-10-CM | POA: Diagnosis not present

## 2019-08-25 DIAGNOSIS — R112 Nausea with vomiting, unspecified: Secondary | ICD-10-CM | POA: Diagnosis not present

## 2019-08-25 MED ORDER — PANTOPRAZOLE SODIUM 40 MG PO TBEC
40.0000 mg | DELAYED_RELEASE_TABLET | Freq: Two times a day (BID) | ORAL | 3 refills | Status: DC
Start: 2019-08-25 — End: 2020-01-24

## 2019-08-25 NOTE — Progress Notes (Addendum)
08/25/2019 Tammy Strickland [id number] Aug 21, 1960   HISTORY OF PRESENT ILLNESS: This is a 59 year old female who is new to our office.  She is here today with complaints of epigastric abdominal pain with nausea and vomiting.  She says "there is something wrong with my stomach".  She had what appeared to be a mild episode of pancreatitis back in February.  Lipase is elevated to just above 300 and CT scan showed some findings of mild or early pancreatitis.  She says that that was thought to be due to alcohol and her poor diet.  Then about 3 weeks ago she developed upper abdominal pain again and this time it was initially associated with nausea, vomiting, and diarrhea as well.  The diarrhea lasted for 8 days, but that has since stopped.  She is continued to have pain.  It is in the epigastrium just to the right of the epigastrium.  She does not have a gallbladder.  She had a CT scan of the abdomen and pelvis with contrast performed again on May 1 that did not show any pancreatitis or any findings to account for her pain/symptoms.  Lipase was normal.  She tells me that her twin sister has Crohn's disease that was diagnosed in the 72s.  She reports that she has had a colonoscopy herself less than 10 years ago in [place], [place].  She says that it was normal at that time.  She says that her bowel movements have returned to normal and she does not have any issues moving her bowels on a regular basis.  She was under the impression that we were going to do some type of endoscopic evaluation today.  She is on pantoprazole 20 mg twice daily.  She says that that was just started recently and prior to that she had been on omeprazole.  She definitely feels like the pantoprazole has been more helpful.  Past Medical History:  Diagnosis Date  . Anxiety   . Arthritis   . Asthma   . CHF (congestive heart failure) ([place])   . COPD (chronic obstructive pulmonary disease) ([place])   . Coronary artery  disease   . Depression   . Diabetes mellitus without complication ([place])    type 2   . Fibromyalgia   . Gallstones   . GERD (gastroesophageal reflux disease)   . HLD (hyperlipidemia)   . Hypertension   . Lupus ([place])   . Migraines   . Neuromuscular disorder ([place])   . Osteoporosis   . Oxygen deficiency    pt uses 2.5L 02 at night,(08/25/2019)pt states she no longer uses oxygen, hasn't used in 7-8 years  . Pancreatitis   . Peripheral neuropathy   . Shingles   . Sleep apnea    had sleep study done recently ; unaware if she will be getting  a CPAP device ; patient states "im pretty sure i have it , i fall alseep all the time "  . Stroke [facility]) 10/2015   Past Surgical History:  Procedure Laterality Date  . ABDOMINAL HYSTERECTOMY     ovaries left  . APPENDECTOMY    . BREAST LUMPECTOMY WITH RADIOACTIVE SEED LOCALIZATION Left 06/14/2019   Procedure: LEFT BREAST LUMPECTOMY WITH RADIOACTIVE SEED LOCALIZATION X 2;  Surgeon: Coralie Keens, MD;  Location: [place];  Service: General;  Laterality: Left;  LMA  . CHOLECYSTECTOMY    . IR GENERIC HISTORICAL  11/07/2015   IR ANGIO INTRA EXTRACRAN SEL COM CAROTID INNOMINATE BILAT MOD  SED 11/07/2015 Luanne Bras, MD [facility]  . IR GENERIC HISTORICAL  11/07/2015   IR ANGIO VERTEBRAL SEL SUBCLAVIAN INNOMINATE UNI R MOD SED 11/07/2015 Luanne Bras, MD [facility]  . IR GENERIC HISTORICAL  11/07/2015   IR ANGIO VERTEBRAL SEL VERTEBRAL UNI L MOD SED 11/07/2015 Luanne Bras, MD [facility]  . IR GENERIC HISTORICAL  11/07/2015   IR ANGIOGRAM EXTREMITY LEFT 11/07/2015 Luanne Bras, MD [facility]  . TONSILLECTOMY    . TOTAL KNEE ARTHROPLASTY Left 03/12/2017   Procedure: LEFT TOTAL KNEE ARTHROPLASTY;  Surgeon: Mcarthur Rossetti, MD;  [facility];  Service: Orthopedics;  Laterality: Left;  Adductor Block    reports that she has been smoking cigarettes. She has a 8.75 pack-year smoking history. She has never used smokeless  tobacco. She reports current alcohol use. She reports that she does not use drugs. family history includes Breast cancer in her sister; Crohn's disease in her sister; Diabetes in her brother; Heart attack in her father and mother; Hyperlipidemia in her father and mother; Hypertension in her brother, brother, father, mother, sister, and sister; Stroke in her father, mother, and sister; Thyroid disease in her mother and sister. Allergies  Allergen Reactions  . Metformin And Related Other (See Comments)    Lactic acidosis  . Bacitracin Hives      Outpatient Encounter Medications as of 08/25/2019  Medication Sig  . amitriptyline (ELAVIL) 75 MG tablet Take 1 tablet by mouth at bedtime (Patient taking differently: Take 75 mg by mouth at bedtime. )  . aspirin EC 81 MG tablet Take 1 tablet (81 mg total) by mouth daily.  Marland Kitchen atorvastatin (LIPITOR) 20 MG tablet Take 1 tablet by mouth every day  . cloNIDine (CATAPRES) 0.3 MG tablet Take 1 tablet by mouth every day (Patient taking differently: Take 0.3 mg by mouth daily. )  . fluticasone (FLONASE) 50 MCG/ACT nasal spray Shake liquid & use 1 spray into each nostril every day as needed for allergies (Patient taking differently: Place 1 spray into both nostrils daily as needed for allergies or rhinitis. )  . furosemide (LASIX) 80 MG tablet Take 1 tablet by mouth every day (Patient taking differently: Take 80 mg by mouth daily. )  . gabapentin (NEURONTIN) 600 MG tablet Take 2 tablets by mouth 3 times a day (Patient taking differently: Take 1,200 mg by mouth 2 (two) times daily. )  . glipiZIDE (GLUCOTROL) 5 MG tablet Take 1 tablet (5 mg total) by mouth daily before breakfast.  . glucose blood test strip Use as instructed to check sugar three times a day  . Lancets MISC Use as directed twice per day  . losartan (COZAAR) 50 MG tablet Take 1 tablet by mouth twice daily (Patient taking differently: Take 50 mg by mouth daily. )  . metoprolol (TOPROL-XL) 200 MG 24 hr  tablet Take 1 tablet by mouth twice daily (Patient taking differently: Take 200 mg by mouth in the morning and at bedtime. )  . nitroGLYCERIN (NITROSTAT) 0.4 MG SL tablet Place 0.4 mg under the tongue every 5 (five) minutes as needed for chest pain.  Marland Kitchen ondansetron (ZOFRAN ODT) 8 MG disintegrating tablet Take 1 tablet (8 mg total) by mouth every 8 (eight) hours as needed for nausea or vomiting.  Marland Kitchen OVER THE COUNTER MEDICATION Take 1 tablet by mouth at bedtime as needed (sleep). Sleep Aid  . oxyCODONE-acetaminophen (PERCOCET) 10-325 MG tablet Take 1 tablet by mouth every 4 (four) hours as needed for pain.   Marland Kitchen  OZEMPIC, 1 MG/DOSE, 2 MG/1.5ML SOPN Inject 1 mg subcutaneously once a week. (Patient taking differently: Inject 1 mg into the skin once a week. Friday)  . potassium chloride SA (KLOR-CON) 20 MEQ tablet Take 1 tablet by mouth every day (Patient taking differently: Take 20 mEq by mouth daily. )  . SYMBICORT 80-4.5 MCG/ACT inhaler Inhale 2 puffs into the lungs daily as needed (Patient taking differently: Inhale 2 puffs into the lungs daily as needed (wheezing/SOB). )  . tiZANidine (ZANAFLEX) 4 MG tablet Take 1 tablet by mouth at bedtime (Patient taking differently: Take 4 mg by mouth at bedtime. )  . [DISCONTINUED] pantoprazole (PROTONIX) 20 MG tablet Take 1 tablet (20 mg total) by mouth 2 (two) times daily.  . pantoprazole (PROTONIX) 40 MG tablet Take 1 tablet (40 mg total) by mouth 2 (two) times daily.   No facility-administered encounter medications on file as of 08/25/2019.     REVIEW OF SYSTEMS  : All other systems reviewed and negative except where noted in the History of Present Illness.   PHYSICAL EXAM: BP 114/74 (BP Location: Left Arm, Patient Position: Sitting, Cuff Size: Large)   Pulse 60   Temp 98 F (36.7 C)   Ht 5' 5.75" (1.67 m) Comment: height measured without shoes  Wt 299 lb (135.6 kg)   BMI 48.63 kg/m  General: Well developed AA female in no acute distress Head:  Normocephalic and atraumatic Eyes:  Sclerae anicteric, conjunctiva pink. Ears: Normal auditory acuity Lungs: Clear throughout to auscultation; no increased WOB. Heart: Regular rate and rhythm; no M/R/G. Abdomen: Soft, non-distended.  BS present.  Epigastric TTP. Musculoskeletal: Symmetrical with no gross deformities  Skin: No lesions on visible extremities Extremities: No edema  Neurological: Alert oriented x 4, grossly non-focal Psychological:  Alert and cooperative. Normal mood and affect  ASSESSMENT AND PLAN: *59 year old female with complaints of epigastric and right upper quadrant abdominal pain with associated nausea and vomiting for the past 3 weeks.  Had what appeared to be a mild episode of pancreatitis back in February, but CT scan earlier this month did not show any evidence of pancreatitis on this occasion and lipase is normal.  No other explanation for pain on CT. Question reflux related versus ulcer disease, etc.  She was started on pantoprazole 20 mg twice daily.  She was previously on omeprazole.  I am going to increase her pantoprazole to 40 mg twice daily.  Prescription sent to her pharmacy.  We will plan for EGD early next week with Dr. Silverio Decamp.  The risks, benefits, and alternatives to EGD were discussed with the patient and she consents to proceed.   **We are requesting records from her previous colonoscopy in [place], [place].  **Addendum: Colonoscopy reports received from [facility] in [place], [place].  Colonoscopy was performed on February 22, 2014 at which time she was found to have mild diverticulosis in the sigmoid colon and grade 2 hemorrhoids with repeat recommended in 10 years.  Records are being sent for scanning.   CC:  Binnie Rail, MD

## 2019-08-25 NOTE — Patient Instructions (Signed)
If you are age 58 or older, your body mass index should be between 23-30. Your Body mass index is 48.63 kg/m. If this is out of the aforementioned range listed, please consider follow up with your Primary Care Provider.  If you are age 64 or younger, your body mass index should be between 19-25. Your Body mass index is 48.63 kg/m. If this is out of the aformentioned range listed, please consider follow up with your Primary Care Provider.   You have been scheduled for an endoscopy. Please follow written instructions given to you at your visit today. If you use inhalers (even only as needed), please bring them with you on the day of your procedure. Your physician has requested that you go to www.startemmi.com and enter the access code given to you at your visit today. This web site gives a general overview about your procedure. However, you should still follow specific instructions given to you by our office regarding your preparation for the procedure.  We have sent the following medications to your pharmacy for you to pick up at your convenience: Pantoprazole 40 mg twice daily.  We are requesting records from GI in [place].   Thank you for choosing me and [facility].   Alonza Bogus, PA-C

## 2019-08-25 NOTE — Telephone Encounter (Signed)
I ordered the referral for Dr Benson Norway, but she does have an appt schedule with [facility] on 5/18 and should cancel that if she is not going to see Dr Silverio Decamp.

## 2019-08-25 NOTE — Telephone Encounter (Signed)
Same thing you saw her for on 5/4. Diarrhea

## 2019-08-28 ENCOUNTER — Ambulatory Visit (INDEPENDENT_AMBULATORY_CARE_PROVIDER_SITE_OTHER): Payer: Medicare Other

## 2019-08-28 ENCOUNTER — Telehealth: Payer: Self-pay | Admitting: Gastroenterology

## 2019-08-28 DIAGNOSIS — Z1159 Encounter for screening for other viral diseases: Secondary | ICD-10-CM

## 2019-08-28 NOTE — Telephone Encounter (Signed)
Pt is scheduled to see Dr. Silverio Decamp on 5/18 at 10am. Pt called wanting to know if she could arrive after 10am for procedure. After speaking with the patient she wanted to know if her daughter could drop her off and then come back to get her. Explained to the patient that our policy is that her care partner has to be on our campus for the entire time in case we need them. Patient then asked if her granddaughter could sit with her while her driver leaves. Her granddaughter is 56yrs of age, and unfortunately our policy states that the care partner has to be at last 70yrs of age. The patient wanted to reschedule for a later time with Dr. Silverio Decamp for the same day, but I explained to the patient that Dr. Silverio Decamp does not have any openings for a later time tomorrow. Pt stated she would like to reschedule for a later date with Dr. Silverio Decamp, when transferring to schedulers the patient hung up. So the patient will need to be called and rescheduled to a later date.

## 2019-08-28 NOTE — Addendum Note (Signed)
Addended by: Binnie Rail on: 08/28/2019 04:23 PM   Modules accepted: Orders

## 2019-08-28 NOTE — Telephone Encounter (Signed)
Pt is scheduled for an egd tomorrow at 10:00am. She wants to know if it is ok to arrive a little late around 10ish. Pls call her.

## 2019-08-28 NOTE — Telephone Encounter (Signed)
Ordered

## 2019-08-28 NOTE — Telephone Encounter (Signed)
Unfortunately our schedules are fixed and we cannot make last-minute changes without effecting other patients or providers.  Patient should try to find alternative care partner to help her come to the appointment on time.  Thank you

## 2019-08-28 NOTE — Telephone Encounter (Signed)
Pt called back and wants another referral put in for Dr. Benson Norway. Will you please put referral back in. Thank you

## 2019-08-28 NOTE — Telephone Encounter (Signed)
Pt wants to keep appointment tomorrow. Sent message to Cecille Rubin to cancel new referral for Dr. Benson Norway.

## 2019-08-29 ENCOUNTER — Encounter: Payer: Medicare Other | Admitting: Gastroenterology

## 2019-08-29 LAB — SARS CORONAVIRUS 2 (TAT 6-24 HRS): SARS Coronavirus 2: NEGATIVE

## 2019-09-01 NOTE — Progress Notes (Signed)
Reviewed and agree with documentation and assessment and plan. K. Veena Dandrea Medders , MD   

## 2019-09-06 ENCOUNTER — Other Ambulatory Visit: Payer: Self-pay | Admitting: Internal Medicine

## 2019-09-06 ENCOUNTER — Telehealth: Payer: Self-pay

## 2019-09-06 NOTE — Telephone Encounter (Signed)
New message    The patient calling to check on the status of the GI referral.

## 2019-09-06 NOTE — Telephone Encounter (Signed)
Spoke to pt  regarding referral

## 2019-09-14 ENCOUNTER — Ambulatory Visit: Payer: Medicare Other | Admitting: Nurse Practitioner

## 2019-09-15 NOTE — Telephone Encounter (Signed)
[facility] to follow up - they are sending message to the provider

## 2019-09-16 ENCOUNTER — Other Ambulatory Visit: Payer: Self-pay

## 2019-09-16 ENCOUNTER — Emergency Department (HOSPITAL_COMMUNITY)
Admission: EM | Admit: 2019-09-16 | Discharge: 2019-09-16 | Disposition: A | Discharge status: Home / Self Care | Payer: Medicare Other | Attending: Emergency Medicine | Admitting: Emergency Medicine

## 2019-09-16 DIAGNOSIS — I110 Hypertensive heart disease with heart failure: Secondary | ICD-10-CM | POA: Diagnosis not present

## 2019-09-16 DIAGNOSIS — L02211 Cutaneous abscess of abdominal wall: Secondary | ICD-10-CM

## 2019-09-16 DIAGNOSIS — E0865 Diabetes mellitus due to underlying condition with hyperglycemia: Secondary | ICD-10-CM | POA: Diagnosis not present

## 2019-09-16 DIAGNOSIS — E1140 Type 2 diabetes mellitus with diabetic neuropathy, unspecified: Secondary | ICD-10-CM | POA: Insufficient documentation

## 2019-09-16 DIAGNOSIS — R739 Hyperglycemia, unspecified: Secondary | ICD-10-CM | POA: Diagnosis present

## 2019-09-16 DIAGNOSIS — Z8673 Personal history of transient ischemic attack (TIA), and cerebral infarction without residual deficits: Secondary | ICD-10-CM | POA: Insufficient documentation

## 2019-09-16 DIAGNOSIS — Z7984 Long term (current) use of oral hypoglycemic drugs: Secondary | ICD-10-CM | POA: Diagnosis not present

## 2019-09-16 DIAGNOSIS — Z96652 Presence of left artificial knee joint: Secondary | ICD-10-CM | POA: Diagnosis not present

## 2019-09-16 DIAGNOSIS — R1010 Upper abdominal pain, unspecified: Secondary | ICD-10-CM | POA: Diagnosis not present

## 2019-09-16 DIAGNOSIS — Z79899 Other long term (current) drug therapy: Secondary | ICD-10-CM | POA: Insufficient documentation

## 2019-09-16 DIAGNOSIS — J441 Chronic obstructive pulmonary disease with (acute) exacerbation: Secondary | ICD-10-CM | POA: Insufficient documentation

## 2019-09-16 DIAGNOSIS — I5032 Chronic diastolic (congestive) heart failure: Secondary | ICD-10-CM | POA: Insufficient documentation

## 2019-09-16 DIAGNOSIS — F17210 Nicotine dependence, cigarettes, uncomplicated: Secondary | ICD-10-CM | POA: Diagnosis not present

## 2019-09-16 LAB — URINALYSIS, ROUTINE W REFLEX MICROSCOPIC
Bacteria, UA: NONE SEEN
Bilirubin Urine: NEGATIVE
Glucose, UA: >=500 mg/dL — AB
Hgb urine dipstick: NEGATIVE
Ketones, ur: NEGATIVE mg/dL
Leukocytes,Ua: NEGATIVE
Nitrite: NEGATIVE
Protein, ur: NEGATIVE mg/dL
Specific Gravity, Urine: 1.015 (ref 1.005–1.030)
pH: 5 (ref 5.0–8.0)

## 2019-09-16 LAB — CBC
HCT: 45.5 % (ref 36.0–46.0)
Hemoglobin: 14.8 g/dL (ref 12.0–15.0)
MCH: 28.7 pg (ref 26.0–34.0)
MCHC: 32.5 g/dL (ref 30.0–36.0)
MCV: 88.2 fL (ref 80.0–100.0)
Platelets: 242 10*3/uL (ref 150–400)
RBC: 5.16 MIL/uL — ABNORMAL HIGH (ref 3.87–5.11)
RDW: 13.8 % (ref 11.5–15.5)
WBC: 9.5 10*3/uL (ref 4.0–10.5)
nRBC: 0 % (ref 0.0–0.2)

## 2019-09-16 LAB — HEPATIC FUNCTION PANEL
ALT: 75 U/L — ABNORMAL HIGH (ref 0–44)
AST: 90 U/L — ABNORMAL HIGH (ref 15–41)
Albumin: 3.6 g/dL (ref 3.5–5.0)
Alkaline Phosphatase: 168 U/L — ABNORMAL HIGH (ref 38–126)
Bilirubin, Direct: <0.1 mg/dL (ref 0.0–0.2)
Total Bilirubin: 0.7 mg/dL (ref 0.3–1.2)
Total Protein: 8.4 g/dL — ABNORMAL HIGH (ref 6.5–8.1)

## 2019-09-16 LAB — CBG MONITORING, ED
Glucose-Capillary: 533 mg/dL (ref 70–99)
Glucose-Capillary: 387 mg/dL — ABNORMAL HIGH (ref 70–99)

## 2019-09-16 LAB — BASIC METABOLIC PANEL
Anion gap: 14 (ref 5–15)
BUN: 10 mg/dL (ref 6–20)
CO2: 26 mmol/L (ref 22–32)
Calcium: 9.4 mg/dL (ref 8.9–10.3)
Chloride: 92 mmol/L — ABNORMAL LOW (ref 98–111)
Creatinine, Ser: 0.85 mg/dL (ref 0.44–1.00)
GFR calc Af Amer: >60 mL/min (ref >60)
GFR calc non Af Amer: >60 mL/min (ref >60)
Glucose, Bld: 524 mg/dL (ref 70–99)
Potassium: 4.6 mmol/L (ref 3.5–5.1)
Sodium: 132 mmol/L — ABNORMAL LOW (ref 135–145)

## 2019-09-16 LAB — LIPASE, BLOOD: Lipase: 27 U/L (ref 11–51)

## 2019-09-16 MED ORDER — INSULIN ASPART 100 UNIT/ML IV SOLN: 8.0000 [IU] | Freq: Once | INTRAVENOUS | Status: DC

## 2019-09-16 MED ORDER — SULFAMETHOXAZOLE-TRIMETHOPRIM 800-160 MG PO TABS
1.0000 | ORAL_TABLET | Freq: Two times a day (BID) | ORAL | 0 refills | Status: AC
Start: 2019-09-16 — End: 2019-09-23

## 2019-09-16 MED ORDER — INSULIN ASPART 100 UNIT/ML ~~LOC~~ SOLN
6.0000 [IU] | Freq: Once | SUBCUTANEOUS | Status: AC
  Administered 2019-09-16: 6 [IU] via SUBCUTANEOUS

## 2019-09-16 NOTE — Discharge Instructions (Signed)
Take antibiotics as directed for your small abscess.  Soak it regularly to help it drain.  Increase your glipizide to 7.5 mg twice daily until you see your doctor this week. Return for new or worsening symptoms.  Follow-up with gastroenterology this week.

## 2019-09-16 NOTE — ED Triage Notes (Signed)
Pt. Stated, Tammy Strickland had my sugar up all week and just cant get it right.And you know I have arthritis and it hurts all over.

## 2019-09-16 NOTE — ED Provider Notes (Signed)
[facility] EMERGENCY DEPARTMENT Provider Note   CSN: [id number] Arrival date & time: 09/16/19  1213     History Chief Complaint  Patient presents with  . Hyperglycemia    Tammy Strickland is a 59 y.o. female.  Patient with multiple medical problems including diabetes, congestive heart failure and asthma presents with intermittent abdominal pain for 3 weeks.  Patient has been trying daily to get in to a gastroenterologist to discuss a possible scope or further work-up however she has been unsuccessful.  Patient been taking medications as prescribed and her Protonix has been helping her upper abdominal pain.  Patient had pancreatitis thought to be due to diabetic medication as she was changed to and she feels since then her sugars been running high while she is on glipizide.  No fevers chills or vomiting.  No worsening pain.  Patient had CT scan recently.  Patient has also had a small boil lower abdomen area similar to previous, no fevers, no spreading redness.        Past Medical History:  Diagnosis Date  . Anxiety   . Arthritis   . Asthma   . CHF (congestive heart failure) ([place])   . COPD (chronic obstructive pulmonary disease) ([place])   . Coronary artery disease   . Depression   . Diabetes mellitus without complication ([place])    type 2   . Fibromyalgia   . Gallstones   . GERD (gastroesophageal reflux disease)   . HLD (hyperlipidemia)   . Hypertension   . Lupus ([place])   . Migraines   . Neuromuscular disorder ([place])   . Osteoporosis   . Oxygen deficiency    pt uses 2.5L 02 at night,(08/25/2019)pt states she no longer uses oxygen, hasn't used in 7-8 years  . Pancreatitis   . Peripheral neuropathy   . Shingles   . Sleep apnea    had sleep study done recently ; unaware if she will be getting  a CPAP device ; patient states "im pretty sure i have it , i fall alseep all the time "  . Stroke [facility]) 10/2015    Patient Active Problem List   Diagnosis Date Noted    . Nausea & vomiting 08/15/2019  . Diarrhea 08/15/2019  . Pancreatitis 05/27/2019  . Severe obesity (BMI >= 40) ([place]) 05/24/2019  . TMJ arthralgia 04/26/2019  . Unilateral primary osteoarthritis, right knee 01/16/2019  . Vaginal yeast infection 11/01/2018  . Anxiety 11/01/2018  . RUQ pain 06/22/2018  . Fatty liver 06/19/2018  . Lactic acid acidosis 06/15/2018  . Transaminitis 06/15/2018  . Epigastric pain 06/15/2018  . Chronic bilateral low back pain without sciatica 05/17/2018  . Body mass index 50.0-59.9, adult ([place]) 05/17/2018  . It band syndrome, left 12/29/2017  . Trochanteric bursitis, left hip 12/29/2017  . Dysphagia 04/29/2017  . History of left knee replacement 04/21/2017  . Pain in left ankle and joints of left foot 04/21/2017  . Pain in left hand 04/21/2017  . OSA (obstructive sleep apnea) 02/23/2017  . Acute gouty arthritis 12/25/2016  . Stroke ([place])   . Chronic fatigue 11/09/2016  . Hypoxia 11/09/2016  . Morbidly obese ([place]) 11/09/2016  . Rash and nonspecific skin eruption 07/21/2016  . Frequent headaches 07/21/2016  . Allergic rhinitis 07/16/2016  . Vitamin D deficiency 04/16/2016  . Encounter for other preprocedural examination 03/12/2016  . Hyperlipidemia 01/15/2016  . Diabetes ([place]) 11/26/2015  . Chronic diastolic congestive heart failure ([place]) 11/06/2015  . TIA (transient ischemic attack)   .  Carotid-cavernous fistula   . Insomnia 10/10/2015  . Depression 10/10/2015  . Benign essential HTN 08/15/2015  . Fibromyalgia 08/15/2015  . Peripheral neuropathy 08/15/2015  . CAD (coronary artery disease) 08/15/2015  . COPD (chronic obstructive pulmonary disease) ([place]) 08/15/2015  . Osteoporosis 08/15/2015  . 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 DDD (degenerative disc disease), lumbar 08/15/2015  . Occupational exposure to industrial toxins 08/15/2015  . GERD (gastroesophageal reflux disease) 08/15/2015    Past Surgical History:  Procedure Laterality Date  .  ABDOMINAL HYSTERECTOMY     ovaries left  . APPENDECTOMY    . BREAST LUMPECTOMY WITH RADIOACTIVE SEED LOCALIZATION Left 06/14/2019   Procedure: LEFT BREAST LUMPECTOMY WITH RADIOACTIVE SEED LOCALIZATION X 2;  Surgeon: Coralie Keens, MD;  Location: [facility];  Service: General;  Laterality: Left;  LMA  . CHOLECYSTECTOMY    . IR GENERIC HISTORICAL  11/07/2015   IR ANGIO INTRA EXTRACRAN SEL COM CAROTID INNOMINATE BILAT MOD SED 11/07/2015 Luanne Bras, MD [facility]  . IR GENERIC HISTORICAL  11/07/2015   IR ANGIO VERTEBRAL SEL SUBCLAVIAN INNOMINATE UNI R MOD SED 11/07/2015 Luanne Bras, MD [facility]  . IR GENERIC HISTORICAL  11/07/2015   IR ANGIO VERTEBRAL SEL VERTEBRAL UNI L MOD SED 11/07/2015 Luanne Bras, MD [facility]  . IR GENERIC HISTORICAL  11/07/2015   IR ANGIOGRAM EXTREMITY LEFT 11/07/2015 Luanne Bras, MD [facility]  . TONSILLECTOMY    . TOTAL KNEE ARTHROPLASTY Left 03/12/2017   Procedure: LEFT TOTAL KNEE ARTHROPLASTY;  Surgeon: Mcarthur Rossetti, MD;  Location: [facility];  Service: Orthopedics;  Laterality: Left;  Adductor Block     OB History    Gravida  3   Para  3   Term  3   Preterm      AB      Living  3     SAB      TAB      Ectopic      Multiple      Live Births  3           Family History  Problem Relation Age of Onset  . Hyperlipidemia Mother   . Hypertension Mother   . Stroke Mother   . Thyroid disease Mother   . Heart attack Mother   . Hyperlipidemia Father   . Hypertension Father   . Stroke Father   . Heart attack Father   . Hypertension Sister   . Stroke Sister   . Thyroid disease Sister   . Breast cancer Sister   . Crohn's disease Sister   . Hypertension Sister   . Hypertension Brother   . Diabetes Brother   . Hypertension Brother     Social History   Tobacco Use  . Smoking status: Current Every Day Smoker    Packs/day: 0.25    Years: 35.00    Pack years: 8.75    Types: Cigarettes  . Smokeless  tobacco: Never Used  . Tobacco comment: referred  to smoking  cessation  classes. at  [facility]   Substance Use Topics  . Alcohol use: Yes    Comment: occasional  . Drug use: No    Home Medications Prior to Admission medications   Medication Sig Start Date End Date Taking? Authorizing Provider  amitriptyline (ELAVIL) 75 MG tablet Take 1 tablet by mouth at bedtime Patient taking differently: Take 75 mg by mouth at bedtime.  07/10/19   Binnie Rail, MD  aspirin EC 81 MG tablet Take  1 tablet (81 mg total) by mouth daily. 11/01/15   Elgergawy, Silver Huguenin, MD  atorvastatin (LIPITOR) 20 MG tablet Take 1 tablet by mouth every day 03/13/19   Binnie Rail, MD  cloNIDine (CATAPRES) 0.3 MG tablet Take 1 tablet by mouth every day Patient taking differently: Take 0.3 mg by mouth daily.  04/25/19   Binnie Rail, MD  fluticasone (FLONASE) 50 MCG/ACT nasal spray Shake liquid & use 1 spray into each nostril every day as needed for allergies Patient taking differently: Place 1 spray into both nostrils daily as needed for allergies or rhinitis.  02/23/19   Binnie Rail, MD  furosemide (LASIX) 80 MG tablet Take 1 tablet by mouth every day Patient taking differently: Take 80 mg by mouth daily.  04/25/19   Binnie Rail, MD  gabapentin (NEURONTIN) 600 MG tablet Take 2 tablets by mouth 3 times a day Patient taking differently: Take 1,200 mg by mouth 2 (two) times daily.  02/23/19   Binnie Rail, MD  glipiZIDE (GLUCOTROL) 5 MG tablet Take 1 tablet by mouth twice daily 09/06/19   Binnie Rail, MD  glucose blood test strip Use as instructed to check sugar three times a day 01/28/17   Binnie Rail, MD  Lancets MISC Use as directed twice per day 01/28/17   Binnie Rail, MD  losartan (COZAAR) 50 MG tablet Take 1 tablet by mouth twice daily Patient taking differently: Take 50 mg by mouth daily.  04/25/19   Binnie Rail, MD  metoprolol (TOPROL-XL) 200 MG 24 hr tablet Take 1 tablet by mouth twice daily Patient  taking differently: Take 200 mg by mouth in the morning and at bedtime.  04/25/19   Binnie Rail, MD  nitroGLYCERIN (NITROSTAT) 0.4 MG SL tablet Place 0.4 mg under the tongue every 5 (five) minutes as needed for chest pain.    [provider]  ondansetron (ZOFRAN ODT) 8 MG disintegrating tablet Take 1 tablet (8 mg total) by mouth every 8 (eight) hours as needed for nausea or vomiting. 08/14/19   Pattricia Boss, MD  OVER THE COUNTER MEDICATION Take 1 tablet by mouth at bedtime as needed (sleep). Sleep Aid    [provider]  oxyCODONE-acetaminophen (PERCOCET) 10-325 MG tablet Take 1 tablet by mouth every 4 (four) hours as needed for pain.  01/31/18   [provider]  OZEMPIC, 1 MG/DOSE, 2 MG/1.5ML SOPN Inject 1 mg subcutaneously once a week. Patient taking differently: Inject 1 mg into the skin once a week. Friday 03/13/19   Binnie Rail, MD  pantoprazole (PROTONIX) 40 MG tablet Take 1 tablet (40 mg total) by mouth 2 (two) times daily. 08/25/19   Zehr, Laban Emperor, PA-C  potassium chloride SA (KLOR-CON) 20 MEQ tablet Take 1 tablet by mouth every day Patient taking differently: Take 20 mEq by mouth daily.  04/25/19   Binnie Rail, MD  sulfamethoxazole-trimethoprim (BACTRIM DS) 800-160 MG tablet Take 1 tablet by mouth 2 (two) times daily for 7 days. 09/16/19 09/23/19  Elnora Morrison, MD  SYMBICORT 80-4.5 MCG/ACT inhaler Inhale 2 puffs into the lungs daily as needed Patient taking differently: Inhale 2 puffs into the lungs daily as needed (wheezing/SOB).  03/13/19   Binnie Rail, MD  tiZANidine (ZANAFLEX) 4 MG tablet Take 1 tablet by mouth at bedtime Patient taking differently: Take 4 mg by mouth at bedtime.  06/08/19   Binnie Rail, MD    Allergies  Metformin and related and Bacitracin  Review of Systems   Review of Systems  Constitutional: Negative for chills and fever.  HENT: Negative for congestion.   Eyes: Negative for visual disturbance.  Respiratory: Negative  for shortness of breath.   Cardiovascular: Negative for chest pain.  Gastrointestinal: Positive for abdominal pain. Negative for vomiting.  Genitourinary: Negative for dysuria and flank pain.  Musculoskeletal: Negative for back pain, neck pain and neck stiffness.  Skin: Positive for rash.  Neurological: Negative for light-headedness and headaches.    Physical Exam Updated Vital Signs BP 120/68 (BP Location: Left Arm)   Pulse 64   Temp 98.3 F (36.8 C) (Oral)   Resp 18   Ht 5' 6.5" (1.689 m)   Wt 129.7 kg   SpO2 98%   BMI 45.47 kg/m   Physical Exam Vitals and nursing note reviewed.  Constitutional:      Appearance: She is well-developed.  HENT:     Head: Normocephalic and atraumatic.  Eyes:     General:        Right eye: No discharge.        Left eye: No discharge.     Conjunctiva/sclera: Conjunctivae normal.  Neck:     Trachea: No tracheal deviation.  Cardiovascular:     Rate and Rhythm: Normal rate and regular rhythm.  Pulmonary:     Effort: Pulmonary effort is normal.     Breath sounds: Normal breath sounds.  Abdominal:     General: There is no distension.     Palpations: Abdomen is soft.     Tenderness: There is no abdominal tenderness. There is no guarding.  Musculoskeletal:     Cervical back: Normal range of motion and neck supple.  Skin:    General: Skin is warm.     Findings: No rash.     Comments: Patient has 2 cm boil with mild fluctuance right lower abdomen pelvis area, no surrounding erythema or warmth, no crepitus.  Neurological:     Mental Status: She is alert and oriented to person, place, and time.     ED Results / Procedures / Treatments   Labs (all labs ordered are listed, but only abnormal results are displayed) Labs Reviewed  BASIC METABOLIC PANEL - Abnormal; Notable for the following components:      Result Value   Sodium 132 (*)    Chloride 92 (*)    Glucose, Bld 524 (*)    All other components within normal limits  CBC - Abnormal;  Notable for the following components:   RBC 5.16 (*)    All other components within normal limits  URINALYSIS, ROUTINE W REFLEX MICROSCOPIC - Abnormal; Notable for the following components:   Color, Urine STRAW (*)    Glucose, UA >=500 (*)    All other components within normal limits  CBG MONITORING, ED - Abnormal; Notable for the following components:   Glucose-Capillary 533 (*)    All other components within normal limits  CBG MONITORING, ED - Abnormal; Notable for the following components:   Glucose-Capillary 387 (*)    All other components within normal limits  LIPASE, BLOOD  HEPATIC FUNCTION PANEL  CBG MONITORING, ED    EKG None  Radiology No results found.  Procedures .Marland KitchenIncision and Drainage  Date/Time: 09/16/2019 4:25 PM Performed by: Elnora Morrison, MD Authorized by: Elnora Morrison, MD   Consent:    Consent obtained:  Verbal   Consent given by:  Patient   Risks discussed:  Bleeding,  infection and damage to other organs   Alternatives discussed:  No treatment Location:    Type:  Abscess   Size:  2 cm   Location:  Trunk   Trunk location:  Abdomen Pre-procedure details:    Skin preparation:  Chloraprep Anesthesia (see MAR for exact dosages):    Anesthesia method:  None Procedure type:    Complexity:  Simple Procedure details:    Needle aspiration: yes     Needle size:  18 G   Incision depth:  Dermal   Drainage:  Purulent and serosanguinous   Drainage amount:  Scant   Wound treatment:  Wound left open   Packing materials:  None Post-procedure details:    Patient tolerance of procedure:  Tolerated well, no immediate complications   (including critical care time)  Medications Ordered in ED Medications  insulin aspart (novoLOG) injection 6 Units (6 Units Subcutaneous Given 09/16/19 1559)    ED Course  I have reviewed the triage vital signs and the nursing notes.  Pertinent labs & imaging results that were available during my care of the patient were  reviewed by me and considered in my medical decision making (see chart for details).    MDM Rules/Calculators/A&P                      Patient presents with recurrent abdominal pain similar to previous.  Reviewed CT scan which was done recently and no acute abnormalities.  Patient instructed to continue to call and work on an appointment with gastroenterology.  No new concerns.  Abdominal exam mild tenderness no guarding.  I suspect similar cause to abdominal pain for when she was last seen as no significant changes.  Discussed with lab as LFTs and lipase were not sent, they are able to add on.  Patient has to leave as she has a graduation to attend and will follow up results with her primary doctor.  524 glucose and sodium 132 appropriate decrease for hyperglycemia.  Urinalysis showed glucose no sign of infection.  Patient stable for outpatient follow-up. Insulin given in the ER, patient's glucose had improved to 300s. To improve glucose control discussed increasing to 7.5 mg twice daily glipizide until she sees her doctor this week and we also gave her 6 units of insulin in the ER.   Final Clinical Impression(s) / ED Diagnoses Final diagnoses:  Diabetes mellitus due to underlying condition with hyperglycemia, without long-term current use of insulin ([place])  Abdominal wall abscess    Rx / DC Orders ED Discharge Orders         Ordered    sulfamethoxazole-trimethoprim (BACTRIM DS) 800-160 MG tablet  2 times daily     09/16/19 1622           Elnora Morrison, MD 09/16/19 [id number]

## 2019-09-18 ENCOUNTER — Telehealth: Payer: Self-pay | Admitting: Internal Medicine

## 2019-09-18 NOTE — Telephone Encounter (Signed)
She needs an appt tomorrow.     Is she taking the ozempic?  I think she stopped it due to diarrhea but was going to restart it if the diarrhea did not change.

## 2019-09-18 NOTE — Progress Notes (Signed)
Subjective:    Patient ID: Tammy Strickland, female    DOB: April 06, 1961, 59 y.o.   MRN: [id number]  HPI The patient is here for an acute visit for elevated sugars.   Diabetes: She is compliant with a diabetic diet. Her sugar here today 584 here in our office.  At home 379, 424, 352, 399, 403, 448, HI, 584 over the past couple of days.  She is only taking her glipizide because she had side effects from the Ozempic and does have a history of pancreatitis.  Right back pain:  She denies heavy lifting.  This started a while ago.  The pain is worse when laying on that side, sometimes deep breaths and sometimes twisting or bending.  She is concerned it may be lung pain.  She is smoking.  She has chronic shortness of breath, but she states this is slightly worse than her baseline.  She does have a primarily dry cough and some wheeze.  She has not had any fevers.  Overall she does not feel well.  She has headaches and blurry vision at times.  He is concerned about her blood pressure, which is low.   Medications and allergies reviewed with patient and updated if appropriate.  Patient Active Problem List   Diagnosis Date Noted  . Nausea & vomiting 08/15/2019  . Diarrhea 08/15/2019  . Pancreatitis 05/27/2019  . Severe obesity (BMI >= 40) ([place]) 05/24/2019  . TMJ arthralgia 04/26/2019  . Unilateral primary osteoarthritis, right knee 01/16/2019  . Vaginal yeast infection 11/01/2018  . Anxiety 11/01/2018  . RUQ pain 06/22/2018  . Fatty liver 06/19/2018  . Lactic acid acidosis 06/15/2018  . Transaminitis 06/15/2018  . Epigastric pain 06/15/2018  . Chronic bilateral low back pain without sciatica 05/17/2018  . Body mass index 50.0-59.9, adult ([place]) 05/17/2018  . It band syndrome, left 12/29/2017  . Trochanteric bursitis, left hip 12/29/2017  . Dysphagia 04/29/2017  . History of left knee replacement 04/21/2017  . Pain in left ankle and joints of left foot 04/21/2017  . Pain in left hand  04/21/2017  . OSA (obstructive sleep apnea) 02/23/2017  . Acute gouty arthritis 12/25/2016  . Stroke ([place])   . Chronic fatigue 11/09/2016  . Hypoxia 11/09/2016  . Morbidly obese ([place]) 11/09/2016  . Rash and nonspecific skin eruption 07/21/2016  . Frequent headaches 07/21/2016  . Allergic rhinitis 07/16/2016  . Vitamin D deficiency 04/16/2016  . Encounter for other preprocedural examination 03/12/2016  . Hyperlipidemia 01/15/2016  . Diabetes ([place]) 11/26/2015  . Chronic diastolic congestive heart failure ([place]) 11/06/2015  . TIA (transient ischemic attack)   . Carotid-cavernous fistula   . Insomnia 10/10/2015  . Depression 10/10/2015  . Benign essential HTN 08/15/2015  . Fibromyalgia 08/15/2015  . Peripheral neuropathy 08/15/2015  . CAD (coronary artery disease) 08/15/2015  . COPD (chronic obstructive pulmonary disease) ([place]) 08/15/2015  . Osteoporosis 08/15/2015  . 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 DDD (degenerative disc disease), lumbar 08/15/2015  . Occupational exposure to industrial toxins 08/15/2015  . GERD (gastroesophageal reflux disease) 08/15/2015    Current Outpatient Medications on File Prior to Visit  Medication Sig Dispense Refill  . amitriptyline (ELAVIL) 75 MG tablet Take 1 tablet by mouth at bedtime (Patient taking differently: Take 75 mg by mouth at bedtime. ) 30 tablet 3  . aspirin EC 81 MG tablet Take 1 tablet (81 mg total) by mouth daily.    Marland Kitchen atorvastatin (LIPITOR) 20 MG tablet Take 1 tablet by mouth  every day 30 tablet 3  . cloNIDine (CATAPRES) 0.3 MG tablet Take 1 tablet by mouth every day (Patient taking differently: Take 0.3 mg by mouth daily. ) 30 tablet 6  . fluticasone (FLONASE) 50 MCG/ACT nasal spray Shake liquid & use 1 spray into each nostril every day as needed for allergies (Patient taking differently: Place 1 spray into both nostrils daily as needed for allergies or rhinitis. ) 16 g 11  . furosemide (LASIX) 80 MG tablet Take 1 tablet by mouth every day  (Patient taking differently: Take 80 mg by mouth daily. ) 30 tablet 6  . gabapentin (NEURONTIN) 600 MG tablet Take 2 tablets by mouth 3 times a day (Patient taking differently: Take 1,200 mg by mouth 2 (two) times daily. ) 180 tablet 11  . glucose blood test strip Use as instructed to check sugar three times a day 200 each 5  . Lancets MISC Use as directed twice per day 200 each 5  . losartan (COZAAR) 50 MG tablet Take 1 tablet by mouth twice daily (Patient taking differently: Take 50 mg by mouth daily. ) 60 tablet 6  . metoprolol (TOPROL-XL) 200 MG 24 hr tablet Take 1 tablet by mouth twice daily (Patient taking differently: Take 200 mg by mouth in the morning and at bedtime. ) 60 tablet 6  . nitroGLYCERIN (NITROSTAT) 0.4 MG SL tablet Place 0.4 mg under the tongue every 5 (five) minutes as needed for chest pain.    Marland Kitchen omeprazole (PRILOSEC) 40 MG capsule Take 40 mg by mouth 2 (two) times daily.    . ondansetron (ZOFRAN ODT) 8 MG disintegrating tablet Take 1 tablet (8 mg total) by mouth every 8 (eight) hours as needed for nausea or vomiting. 20 tablet 0  . OVER THE COUNTER MEDICATION Take 1 tablet by mouth at bedtime as needed (sleep). Sleep Aid    . oxyCODONE-acetaminophen (PERCOCET) 10-325 MG tablet Take 1 tablet by mouth every 4 (four) hours as needed for pain.   0  . pantoprazole (PROTONIX) 40 MG tablet Take 1 tablet (40 mg total) by mouth 2 (two) times daily. 60 tablet 3  . potassium chloride SA (KLOR-CON) 20 MEQ tablet Take 1 tablet by mouth every day (Patient taking differently: Take 20 mEq by mouth daily. ) 30 tablet 6  . sulfamethoxazole-trimethoprim (BACTRIM DS) 800-160 MG tablet Take 1 tablet by mouth 2 (two) times daily for 7 days. 14 tablet 0  . SYMBICORT 80-4.5 MCG/ACT inhaler Inhale 2 puffs into the lungs daily as needed (Patient taking differently: Inhale 2 puffs into the lungs daily as needed (wheezing/SOB). ) 1 Inhaler 11  . tiZANidine (ZANAFLEX) 4 MG tablet Take 1 tablet by mouth at  bedtime (Patient taking differently: Take 4 mg by mouth at bedtime. ) 30 tablet 3   No current facility-administered medications on file prior to visit.    Past Medical History:  Diagnosis Date  . Anxiety   . Arthritis   . Asthma   . CHF (congestive heart failure) ([place])   . COPD (chronic obstructive pulmonary disease) ([place])   . Coronary artery disease   . Depression   . Diabetes mellitus without complication ([place])    type 2   . Fibromyalgia   . Gallstones   . GERD (gastroesophageal reflux disease)   . HLD (hyperlipidemia)   . Hypertension   . Lupus ([place])   . Migraines   . Neuromuscular disorder ([place])   . Osteoporosis   . Oxygen deficiency  pt uses 2.5L 02 at night,(08/25/2019)pt states she no longer uses oxygen, hasn't used in 7-8 years  . Pancreatitis   . Peripheral neuropathy   . Shingles   . Sleep apnea    had sleep study done recently ; unaware if she will be getting  a CPAP device ; patient states "im pretty sure i have it , i fall alseep all the time "  . Stroke [facility]) 10/2015    Past Surgical History:  Procedure Laterality Date  . ABDOMINAL HYSTERECTOMY     ovaries left  . APPENDECTOMY    . BREAST LUMPECTOMY WITH RADIOACTIVE SEED LOCALIZATION Left 06/14/2019   Procedure: LEFT BREAST LUMPECTOMY WITH RADIOACTIVE SEED LOCALIZATION X 2;  Surgeon: Coralie Keens, MD;  Location: [facility];  Service: General;  Laterality: Left;  LMA  . CHOLECYSTECTOMY    . IR GENERIC HISTORICAL  11/07/2015   IR ANGIO INTRA EXTRACRAN SEL COM CAROTID INNOMINATE BILAT MOD SED 11/07/2015 Luanne Bras, MD [facility]  . IR GENERIC HISTORICAL  11/07/2015   IR ANGIO VERTEBRAL SEL SUBCLAVIAN INNOMINATE UNI R MOD SED 11/07/2015 Luanne Bras, MD [facility]  . IR GENERIC HISTORICAL  11/07/2015   IR ANGIO VERTEBRAL SEL VERTEBRAL UNI L MOD SED 11/07/2015 Luanne Bras, MD [facility]  . IR GENERIC HISTORICAL  11/07/2015   IR ANGIOGRAM EXTREMITY LEFT 11/07/2015 Luanne Bras, MD  [facility]  . TONSILLECTOMY    . TOTAL KNEE ARTHROPLASTY Left 03/12/2017   Procedure: LEFT TOTAL KNEE ARTHROPLASTY;  Surgeon: Mcarthur Rossetti, MD;  Location: [facility];  Service: Orthopedics;  Laterality: Left;  Adductor Block    Social History   Socioeconomic History  . Marital status: Single    Spouse name: n/a  . Number of children: 3  . Years of education: 12+  . Highest education level: Not on file  Occupational History  . 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 Smoking status: Current Every Day Smoker    Packs/day: 0.25    Years: 35.00    Pack years: 8.75    Types: Cigarettes  . Smokeless tobacco: Never Used  . Tobacco comment: referred  to smoking  cessation  classes. at  [facility]   Substance and Sexual Activity  . Alcohol use: Yes    Comment: occasional  . Drug use: No  . Sexual activity: Not Currently    Partners: Female    Birth control/protection: Surgical    Comment: hysterectomy  Other Topics Concern  . Not on file  Social History Narrative   Moved to [place] from [place], [place] February 2017, to help her daughter.   Lives with her daughter.   Sons live in [place] and [place].   She reports that there were originally 17 children in her family (she is the youngest), the oldest are deceased, some prior to her birth, and she isn't sure which were female/female or how they died.   Social Determinants of Health   Financial Resource Strain:   . Difficulty of Paying Living Expenses:   Food Insecurity:   . Worried About Charity fundraiser in the Last Year:   . Arboriculturist in the Last Year:   Transportation Needs:   . Film/video editor (Medical):   Marland Kitchen Lack of Transportation (Non-Medical):   Physical Activity:   . Days of Exercise per Week:   . Minutes of Exercise per Session:   Stress:   .  Feeling of Stress :   Social Connections:   . Frequency of  Communication with Friends and Family:   . Frequency of Social Gatherings with Friends and Family:   . Attends Religious Services:   . Active Member of Clubs or Organizations:   . Attends Archivist Meetings:   Marland Kitchen Marital Status:     Family History  Problem Relation Age of Onset  . Hyperlipidemia Mother   . Hypertension Mother   . Stroke Mother   . Thyroid disease Mother   . Heart attack Mother   . Hyperlipidemia Father   . Hypertension Father   . Stroke Father   . Heart attack Father   . Hypertension Sister   . Stroke Sister   . Thyroid disease Sister   . Breast cancer Sister   . Crohn's disease Sister   . Hypertension Sister   . Hypertension Brother   . Diabetes Brother   . Hypertension Brother     Review of Systems  Constitutional: Negative for chills and fever.  Eyes: Positive for visual disturbance.  Respiratory: Positive for cough (dry), shortness of breath (worse than baseline) and wheezing.   Cardiovascular: Positive for chest pain and palpitations. Negative for leg swelling.  Gastrointestinal: Positive for nausea (rare).  Endocrine: Positive for polydipsia and polyuria.  Neurological: Positive for dizziness, light-headedness and headaches.       Objective:   Vitals:   09/19/19 0804  BP: 104/80  Pulse: 69  Temp: 98.4 F (36.9 C)  SpO2: 97%   BP Readings from Last 3 Encounters:  09/19/19 104/80  09/16/19 120/68  08/25/19 114/74   Wt Readings from Last 3 Encounters:  09/19/19 286 lb 6.4 oz (129.9 kg)  09/16/19 286 lb (129.7 kg)  08/25/19 299 lb (135.6 kg)   Body mass index is 45.53 kg/m.   Physical Exam    Constitutional: Appears well-developed and well-nourished. No distress.  Head: Normocephalic and atraumatic.  Neck: Neck supple. No tracheal deviation present. No thyromegaly present.  No cervical lymphadenopathy Cardiovascular: Normal rate, regular rhythm and normal heart sounds.  No murmur heard. No carotid bruit .  No  edema Pulmonary/Chest: Effort normal and breath sounds normal. No respiratory distress. No has no wheezes. No rales. Abd: Soft, obese, right upper quadrant tenderness with palpation, minimal epigastric tenderness, no left upper quadrant tenderness or tenderness elsewhere Skin: Skin is warm and dry. Not diaphoretic.  Psychiatric: Normal mood and affect. Behavior is normal.       Assessment & Plan:    See Problem List for Assessment and Plan of chronic medical problems.    This visit occurred during the SARS-CoV-2 public health emergency.  Safety protocols were in place, including screening questions prior to the visit, additional usage of staff PPE, and extensive cleaning of exam room while observing appropriate contact time as indicated for disinfecting solutions.

## 2019-09-18 NOTE — Telephone Encounter (Signed)
Dr. Benson Norway has declined to see pt - referral sent to [facility] and pt is aware

## 2019-09-18 NOTE — Telephone Encounter (Signed)
    Patient calling to report she went to ED over the weekend due to elevated blood sugar. Patient states at 7am her blood sugar was 365 and  399 at 10:20 thismorning Ms. Grenda wants to know what medication/dosage she should take this morning, she states she has already taken an extra dose of Glipizide

## 2019-09-19 ENCOUNTER — Telehealth: Payer: Self-pay

## 2019-09-19 ENCOUNTER — Telehealth: Payer: Self-pay | Admitting: Internal Medicine

## 2019-09-19 ENCOUNTER — Ambulatory Visit (INDEPENDENT_AMBULATORY_CARE_PROVIDER_SITE_OTHER): Payer: Medicare Other

## 2019-09-19 ENCOUNTER — Ambulatory Visit (INDEPENDENT_AMBULATORY_CARE_PROVIDER_SITE_OTHER): Payer: Medicare Other | Admitting: Internal Medicine

## 2019-09-19 ENCOUNTER — Encounter: Payer: Self-pay | Admitting: Internal Medicine

## 2019-09-19 ENCOUNTER — Other Ambulatory Visit: Payer: Self-pay

## 2019-09-19 VITALS — BP 104/80 | HR 69 | Temp 98.4°F | Ht 66.5 in | Wt 286.4 lb

## 2019-09-19 DIAGNOSIS — E1165 Type 2 diabetes mellitus with hyperglycemia: Secondary | ICD-10-CM

## 2019-09-19 DIAGNOSIS — I10 Essential (primary) hypertension: Secondary | ICD-10-CM

## 2019-09-19 DIAGNOSIS — R05 Cough: Secondary | ICD-10-CM

## 2019-09-19 DIAGNOSIS — J069 Acute upper respiratory infection, unspecified: Secondary | ICD-10-CM | POA: Insufficient documentation

## 2019-09-19 DIAGNOSIS — Z794 Long term (current) use of insulin: Secondary | ICD-10-CM | POA: Diagnosis not present

## 2019-09-19 DIAGNOSIS — R1010 Upper abdominal pain, unspecified: Secondary | ICD-10-CM | POA: Diagnosis not present

## 2019-09-19 DIAGNOSIS — R059 Cough, unspecified: Secondary | ICD-10-CM

## 2019-09-19 LAB — COMPREHENSIVE METABOLIC PANEL
ALT: 68 U/L — ABNORMAL HIGH (ref 0–35)
AST: 101 U/L — ABNORMAL HIGH (ref 0–37)
Albumin: 4 g/dL (ref 3.5–5.2)
Alkaline Phosphatase: 178 U/L — ABNORMAL HIGH (ref 39–117)
BUN: 12 mg/dL (ref 6–23)
CO2: 29 mEq/L (ref 19–32)
Calcium: 9.7 mg/dL (ref 8.4–10.5)
Chloride: 94 mEq/L — ABNORMAL LOW (ref 96–112)
Creatinine, Ser: 0.83 mg/dL (ref 0.40–1.20)
GFR: 85.6 mL/min (ref >60.00)
Glucose, Bld: 592 mg/dL (ref 70–99)
Potassium: 4.7 mEq/L (ref 3.5–5.1)
Sodium: 131 mEq/L — ABNORMAL LOW (ref 135–145)
Total Bilirubin: 0.3 mg/dL (ref 0.2–1.2)
Total Protein: 7.9 g/dL (ref 6.0–8.3)

## 2019-09-19 LAB — LIPID PANEL
Cholesterol: 170 mg/dL (ref 0–200)
HDL: 39.9 mg/dL (ref >39.00)
NonHDL: 130.08
Total CHOL/HDL Ratio: 4
Triglycerides: 221 mg/dL — ABNORMAL HIGH (ref 0.0–149.0)
VLDL: 44.2 mg/dL — ABNORMAL HIGH (ref 0.0–40.0)

## 2019-09-19 LAB — CBC WITH DIFFERENTIAL/PLATELET
Basophils Absolute: 0 10*3/uL (ref 0.0–0.1)
Basophils Relative: 0.5 % (ref 0.0–3.0)
Eosinophils Absolute: 0.3 10*3/uL (ref 0.0–0.7)
Eosinophils Relative: 4 % (ref 0.0–5.0)
HCT: 43.3 % (ref 36.0–46.0)
Hemoglobin: 14.3 g/dL (ref 12.0–15.0)
Lymphocytes Relative: 41.5 % (ref 12.0–46.0)
Lymphs Abs: 3.2 10*3/uL (ref 0.7–4.0)
MCHC: 33 g/dL (ref 30.0–36.0)
MCV: 87.9 fl (ref 78.0–100.0)
Monocytes Absolute: 0.5 10*3/uL (ref 0.1–1.0)
Monocytes Relative: 6.8 % (ref 3.0–12.0)
Neutro Abs: 3.6 10*3/uL (ref 1.4–7.7)
Neutrophils Relative %: 47.2 % (ref 43.0–77.0)
Platelets: 188 10*3/uL (ref 150.0–400.0)
RBC: 4.93 Mil/uL (ref 3.87–5.11)
RDW: 15 % (ref 11.5–15.5)
WBC: 7.7 10*3/uL (ref 4.0–10.5)

## 2019-09-19 LAB — LIPASE: Lipase: 27 U/L (ref 11.0–59.0)

## 2019-09-19 LAB — HEMOGLOBIN A1C: Hgb A1c MFr Bld: 12.3 % — ABNORMAL HIGH (ref 4.6–6.5)

## 2019-09-19 LAB — LDL CHOLESTEROL, DIRECT: Direct LDL: 101 mg/dL

## 2019-09-19 MED ORDER — FREESTYLE LIBRE 14 DAY SENSOR MISC: 5 refills | Status: DC

## 2019-09-19 MED ORDER — FREESTYLE LIBRE 14 DAY READER DEVI: 0 refills | Status: DC

## 2019-09-19 MED ORDER — INSULIN GLARGINE 100 UNIT/ML SOLOSTAR PEN: 20.0000 [IU] | PEN_INJECTOR | Freq: Every day | SUBCUTANEOUS | 11 refills | Status: DC

## 2019-09-19 MED ORDER — PEN NEEDLES 32G X 5 MM MISC: 1 refills | Status: DC

## 2019-09-19 MED ORDER — LOSARTAN POTASSIUM 25 MG PO TABS: 25.0000 mg | ORAL_TABLET | Freq: Every day | ORAL | 1 refills | Status: DC

## 2019-09-19 NOTE — Assessment & Plan Note (Signed)
Chronic Uncontrolled Currently taking glipizide only Has not tolerated Metformin or Ozempic Sugars ranging from 300s-500s Start Lantus 20 units daily Stressed diabetic diet, exercise and weight loss Follow-up in 2 weeks, but call sooner with questions regarding sugars or medication Will try to get freestyle [place] approved

## 2019-09-19 NOTE — Telephone Encounter (Signed)
New Message:    Pt's daughter is calling and states the pt is needing a prior authorization for Continuous Blood Gluc Receiver (FREESTYLE LIBRE 14 DAY READER) DEVI Continuous Blood Gluc Sensor (FREESTYLE LIBRE 14 DAY SENSOR) MISC in order for Medicaid to pay for this medication. Pt would also like a call from the assistant. Please advise.

## 2019-09-19 NOTE — Assessment & Plan Note (Signed)
Acute on chronic She does have somewhat of a chronic cough, but states this is worse and also has increased shortness of breath and some wheezing She is an active smoker-stressed smoking cessation-she states she is trying She is having some right back pain and is concerned this is her lungs We will get a chest x-ray to rule out pneumonia Discussed this may also be muscular skeletal in nature

## 2019-09-19 NOTE — Telephone Encounter (Signed)
CRITICAL VALUE STICKER  CRITICAL VALUE: Glucose 592  RECEIVER (on-site recipient of call): Tammy Strickland  Tammy Strickland NOTIFIED: 09/19/19 at 1042   MESSENGER (representative from lab): Hope  MD NOTIFIED: dr Quay Burow  TIME OF NOTIFICATION: 0228  RESPONSE: MD expected hyperglycemia & is aware. No further orders for RN at this time.

## 2019-09-19 NOTE — Assessment & Plan Note (Signed)
Chronic Blood pressure on the low side here today and overall not feeling well which is likely multifactorial We will continue metoprolol at current dose Decrease losartan to 25 mg daily Follow-up in 2 weeks

## 2019-09-19 NOTE — Assessment & Plan Note (Addendum)
Acute on chronic Has been experiencing upper abdominal pain-in epigastrium and primarily in the right upper quadrant Will call to schedule GI appointment GERD currently controlled Has fatty liver, which could be causing some discomfort Has had her gallbladder removed CT scan 08/12/2019 did not show any cause for the pain To see GI, continue pantoprazole

## 2019-09-19 NOTE — Patient Instructions (Addendum)
Call Dr Earlean Shawl (GI) for an appointment -- Phone: [phone]  Blood work was ordered.  A chest xray was ordered.    Medications reviewed and updated.  Changes include :     Decrease losartan to 25 mg daily.  Stop glipizide.  Start insulin 20 units daily.   Your prescription(s) have been submitted to your pharmacy. Please take as directed and contact our office if you believe you are having problem(s) with the medication(s).    Please followup in 2 weeks

## 2019-09-20 ENCOUNTER — Telehealth: Payer: Self-pay | Admitting: Internal Medicine

## 2019-09-20 DIAGNOSIS — G894 Chronic pain syndrome: Secondary | ICD-10-CM | POA: Diagnosis not present

## 2019-09-20 DIAGNOSIS — M5136 Other intervertebral disc degeneration, lumbar region: Secondary | ICD-10-CM | POA: Diagnosis not present

## 2019-09-20 DIAGNOSIS — M48061 Spinal stenosis, lumbar region without neurogenic claudication: Secondary | ICD-10-CM | POA: Diagnosis not present

## 2019-09-20 DIAGNOSIS — M1711 Unilateral primary osteoarthritis, right knee: Secondary | ICD-10-CM | POA: Diagnosis not present

## 2019-09-20 DIAGNOSIS — M19042 Primary osteoarthritis, left hand: Secondary | ICD-10-CM | POA: Diagnosis not present

## 2019-09-20 DIAGNOSIS — Z471 Aftercare following joint replacement surgery: Secondary | ICD-10-CM | POA: Diagnosis not present

## 2019-09-20 DIAGNOSIS — M5442 Lumbago with sciatica, left side: Secondary | ICD-10-CM | POA: Diagnosis not present

## 2019-09-20 DIAGNOSIS — M25559 Pain in unspecified hip: Secondary | ICD-10-CM | POA: Diagnosis not present

## 2019-09-20 DIAGNOSIS — Z96652 Presence of left artificial knee joint: Secondary | ICD-10-CM | POA: Diagnosis not present

## 2019-09-20 DIAGNOSIS — M19041 Primary osteoarthritis, right hand: Secondary | ICD-10-CM | POA: Diagnosis not present

## 2019-09-20 DIAGNOSIS — M79641 Pain in right hand: Secondary | ICD-10-CM | POA: Diagnosis not present

## 2019-09-20 DIAGNOSIS — M4316 Spondylolisthesis, lumbar region: Secondary | ICD-10-CM | POA: Diagnosis not present

## 2019-09-20 DIAGNOSIS — M25561 Pain in right knee: Secondary | ICD-10-CM | POA: Diagnosis not present

## 2019-09-20 DIAGNOSIS — M1612 Unilateral primary osteoarthritis, left hip: Secondary | ICD-10-CM | POA: Diagnosis not present

## 2019-09-20 NOTE — Telephone Encounter (Signed)
Tried to reach daughter but number listed is a non working number. Will call patient to advise.

## 2019-09-20 NOTE — Telephone Encounter (Signed)
    Patient states she is having back pain, requesting medication be prescribed for pain. She is unable to receive other pain management until 7/8

## 2019-09-20 NOTE — Telephone Encounter (Signed)
Tammy Strickland (Key: [id number])  Your information has been sent to [facility].

## 2019-09-21 ENCOUNTER — Telehealth: Payer: Self-pay | Admitting: Internal Medicine

## 2019-09-21 ENCOUNTER — Other Ambulatory Visit: Payer: Self-pay | Admitting: Internal Medicine

## 2019-09-21 MED ORDER — OXYCODONE-ACETAMINOPHEN 10-325 MG PO TABS: 1.0000 | ORAL_TABLET | ORAL | 0 refills | Status: DC | PRN

## 2019-09-21 NOTE — Telephone Encounter (Signed)
Please call [facility] on file - I sent in the percocet yesterday - if she has not picked that up I would like it cancelled.     She did see pain management already which I did not realize.

## 2019-09-21 NOTE — Telephone Encounter (Signed)
New message:   Pt is calling to check on the status of her medication she would like for her back pain. Please advise.

## 2019-09-21 NOTE — Telephone Encounter (Signed)
I will prescribe 1 month only on the oxycodone.  After that she will need to get it from pain management-no exceptions.  Prescription sent to [facility]

## 2019-09-22 NOTE — Telephone Encounter (Signed)
Pharmacy opens at 9:00

## 2019-09-22 NOTE — Addendum Note (Signed)
Addended by: Binnie Rail on: 09/22/2019 12:37 PM   Modules accepted: Orders

## 2019-09-22 NOTE — Telephone Encounter (Signed)
No need to appeal - they will not cover it.   She should have a glucometer at home.  She comes in 6/22

## 2019-09-26 ENCOUNTER — Telehealth: Payer: Self-pay | Admitting: Internal Medicine

## 2019-09-26 MED ORDER — BLOOD GLUCOSE MONITOR KIT: PACK | 0 refills | Status: DC

## 2019-09-26 NOTE — Telephone Encounter (Signed)
glucometer rx printed - not able to e-prescribe.  She was referred to [facility] - it looks like they have already called her......Marland Kitchen

## 2019-09-26 NOTE — Telephone Encounter (Signed)
Called and spoke with Rolinda Roan fro [facility]. Ref# for call is [id number]. She states no precert needed for codes [id number], [id number], [id number] for [facility]. I left message for patient with info today. Accu-check meter was faxed in for her earlier today with supplies.

## 2019-09-26 NOTE — Telephone Encounter (Signed)
New message:   Pt is calling and states she needs to know what GI doctor she was referred to. She also states she is still unable to check her sugar due to the device that was ordered her insurance doesn't want to pay for it. Please advise.

## 2019-09-29 ENCOUNTER — Other Ambulatory Visit: Payer: Self-pay | Admitting: Family

## 2019-09-29 MED ORDER — LANCETS MISC: 1 refills | Status: DC

## 2019-10-02 NOTE — Patient Instructions (Addendum)
Medications reviewed and updated.  Changes include :   Increase insulin to 35 units daily.    Call me in one week with your sugars numbers.    Your prescription(s) have been submitted to your pharmacy. Please take as directed and contact our office if you believe you are having problem(s) with the medication(s).    Please followup in 1 month

## 2019-10-02 NOTE — Progress Notes (Signed)
Subjective:    Patient ID: Tammy Strickland, female    DOB: [birth year], 59 y.o.   MRN: [id number]  HPI The patient is here for follow up of their chronic medical problems, including DM and htn  Elevated liver enzymes.  She has a lot of gas.  She has it every day, all day.  She has BM 3-4 times a day.  She denies GERD. She denies abdominal pain but has some discomfort.  She is eager to see GI for further evaluation of the gas.  She does not feel that the gas worsened after increasing pantoprazole.  She is taking the insulin daily.  She has been 344, 393, 395.  She is eating a lot of veges.  She does not eat a lot of carbs.  She snacks on ice cream or cookies and milk.     We decreased her losartan dose two weeks ago.    She has a boil on her lower abdomen.  It has a pus discharge and it it painful.  She completed the bactrim w/o improvement.   Medications and allergies reviewed with patient and updated if appropriate.  Patient Active Problem List   Diagnosis Date Noted  . Upper abdominal pain 09/19/2019  . Cough 09/19/2019  . Nausea & vomiting 08/15/2019  . Diarrhea 08/15/2019  . Pancreatitis 05/27/2019  . Severe obesity (BMI >= 40) ([place]) 05/24/2019  . TMJ arthralgia 04/26/2019  . Unilateral primary osteoarthritis, right knee 01/16/2019  . Vaginal yeast infection 11/01/2018  . Anxiety 11/01/2018  . RUQ pain 06/22/2018  . Fatty liver 06/19/2018  . Lactic acid acidosis 06/15/2018  . Transaminitis 06/15/2018  . Chronic bilateral low back pain without sciatica 05/17/2018  . Body mass index 50.0-59.9, adult ([place]) 05/17/2018  . It band syndrome, left 12/29/2017  . Trochanteric bursitis, left hip 12/29/2017  . Dysphagia 04/29/2017  . History of left knee replacement 04/21/2017  . Pain in left ankle and joints of left foot 04/21/2017  . Pain in left hand 04/21/2017  . OSA (obstructive sleep apnea) 02/23/2017  . Acute gouty arthritis 12/25/2016  . Stroke ([place])   . Chronic  fatigue 11/09/2016  . Hypoxia 11/09/2016  . Morbidly obese ([place]) 11/09/2016  . Rash and nonspecific skin eruption 07/21/2016  . Frequent headaches 07/21/2016  . Allergic rhinitis 07/16/2016  . Vitamin D deficiency 04/16/2016  . Encounter for other preprocedural examination 03/12/2016  . Hyperlipidemia 01/15/2016  . Uncontrolled diabetes mellitus ([place]) 11/26/2015  . Chronic diastolic congestive heart failure ([place]) 11/06/2015  . TIA (transient ischemic attack)   . Carotid-cavernous fistula   . Insomnia 10/10/2015  . Depression 10/10/2015  . Benign essential HTN 08/15/2015  . Fibromyalgia 08/15/2015  . Peripheral neuropathy 08/15/2015  . CAD (coronary artery disease) 08/15/2015  . COPD (chronic obstructive pulmonary disease) ([place]) 08/15/2015  . Osteoporosis 08/15/2015  . 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 DDD (degenerative disc disease), lumbar 08/15/2015  . Occupational exposure to industrial toxins 08/15/2015  . GERD (gastroesophageal reflux disease) 08/15/2015    Current Outpatient Medications on File Prior to Visit  Medication Sig Dispense Refill  . amitriptyline (ELAVIL) 75 MG tablet Take 1 tablet by mouth at bedtime (Patient taking differently: Take 75 mg by mouth at bedtime. ) 30 tablet 3  . aspirin EC 81 MG tablet Take 1 tablet (81 mg total) by mouth daily.    Marland Kitchen atorvastatin (LIPITOR) 20 MG tablet Take 1 tablet by mouth every day 30 tablet 3  . blood glucose  meter kit and supplies KIT Accu-check. Use up to four times daily as directed. (FOR E11.65). 1 each 0  . cloNIDine (CATAPRES) 0.3 MG tablet Take 1 tablet by mouth every day (Patient taking differently: Take 0.3 mg by mouth daily. ) 30 tablet 6  . fluticasone (FLONASE) 50 MCG/ACT nasal spray Shake liquid & use 1 spray into each nostril every day as needed for allergies (Patient taking differently: Place 1 spray into both nostrils daily as needed for allergies or rhinitis. ) 16 g 11  . furosemide (LASIX) 80 MG tablet Take 1 tablet  by mouth every day (Patient taking differently: Take 80 mg by mouth daily. ) 30 tablet 6  . gabapentin (NEURONTIN) 600 MG tablet Take 2 tablets by mouth 3 times a day (Patient taking differently: Take 1,200 mg by mouth 2 (two) times daily. ) 180 tablet 11  . glucose blood test strip Use as instructed to check sugar three times a day 200 each 5  . insulin glargine (LANTUS) 100 UNIT/ML Solostar Pen Inject 20 Units into the skin daily. 15 mL 11  . Insulin Pen Needle (PEN NEEDLES) 32G X 5 MM MISC UAD daily for insulin 90 each 1  . Lancets MISC Test four times per day as directed 300 each 1  . losartan (COZAAR) 25 MG tablet Take 1 tablet (25 mg total) by mouth daily. 90 tablet 1  . metoprolol (TOPROL-XL) 200 MG 24 hr tablet Take 1 tablet by mouth twice daily (Patient taking differently: Take 200 mg by mouth in the morning and at bedtime. ) 60 tablet 6  . nitroGLYCERIN (NITROSTAT) 0.4 MG SL tablet Place 0.4 mg under the tongue every 5 (five) minutes as needed for chest pain.    Marland Kitchen OVER THE COUNTER MEDICATION Take 1 tablet by mouth at bedtime as needed (sleep). Sleep Aid    . pantoprazole (PROTONIX) 40 MG tablet Take 1 tablet (40 mg total) by mouth 2 (two) times daily. 60 tablet 3  . potassium chloride SA (KLOR-CON) 20 MEQ tablet Take 1 tablet by mouth every day (Patient taking differently: Take 20 mEq by mouth daily. ) 30 tablet 6  . SYMBICORT 80-4.5 MCG/ACT inhaler Inhale 2 puffs into the lungs daily as needed (Patient taking differently: Inhale 2 puffs into the lungs daily as needed (wheezing/SOB). ) 1 Inhaler 11  . tiZANidine (ZANAFLEX) 4 MG tablet Take 1 tablet by mouth at bedtime (Patient taking differently: Take 4 mg by mouth at bedtime. ) 30 tablet 3   No current facility-administered medications on file prior to visit.    Past Medical History:  Diagnosis Date  . Anxiety   . Arthritis   . Asthma   . CHF (congestive heart failure) ([place])   . COPD (chronic obstructive pulmonary disease) ([place])     . Coronary artery disease   . Depression   . Diabetes mellitus without complication ([place])    type 2   . Fibromyalgia   . Gallstones   . GERD (gastroesophageal reflux disease)   . HLD (hyperlipidemia)   . Hypertension   . Lupus ([place])   . Migraines   . Neuromuscular disorder ([place])   . Osteoporosis   . Oxygen deficiency    pt uses 2.5L 02 at night,(08/25/2019)pt states she no longer uses oxygen, hasn't used in 7-8 years  . Pancreatitis   . Peripheral neuropathy   . Shingles   . Sleep apnea    had sleep study done recently ; unaware if she will  be getting  a CPAP device ; patient states "im pretty sure i have it , i fall alseep all the time "  . Stroke [facility]) 10/2015    Past Surgical History:  Procedure Laterality Date  . ABDOMINAL HYSTERECTOMY     ovaries left  . APPENDECTOMY    . BREAST LUMPECTOMY WITH RADIOACTIVE SEED LOCALIZATION Left 06/14/2019   Procedure: LEFT BREAST LUMPECTOMY WITH RADIOACTIVE SEED LOCALIZATION X 2;  Surgeon: Coralie Keens, MD;  Location: [facility];  Service: General;  Laterality: Left;  LMA  . CHOLECYSTECTOMY    . IR GENERIC HISTORICAL  11/07/2015   IR ANGIO INTRA EXTRACRAN SEL COM CAROTID INNOMINATE BILAT MOD SED 11/07/2015 Luanne Bras, MD [facility]  . IR GENERIC HISTORICAL  11/07/2015   IR ANGIO VERTEBRAL SEL SUBCLAVIAN INNOMINATE UNI R MOD SED 11/07/2015 Luanne Bras, MD [facility]  . IR GENERIC HISTORICAL  11/07/2015   IR ANGIO VERTEBRAL SEL VERTEBRAL UNI L MOD SED 11/07/2015 Luanne Bras, MD [facility]  . IR GENERIC HISTORICAL  11/07/2015   IR ANGIOGRAM EXTREMITY LEFT 11/07/2015 Luanne Bras, MD [facility]  . TONSILLECTOMY    . TOTAL KNEE ARTHROPLASTY Left 03/12/2017   Procedure: LEFT TOTAL KNEE ARTHROPLASTY;  Surgeon: Mcarthur Rossetti, MD;  Location: [facility];  Service: Orthopedics;  Laterality: Left;  Adductor Block    Social History   Socioeconomic History  . Marital status: Single    Spouse name: n/a  .  Number of children: 3  . Years of education: 12+  . Highest education level: Not on file  Occupational History  . 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 Smoking status: Current Every Day Smoker    Packs/day: 0.25    Years: 35.00    Pack years: 8.75    Types: Cigarettes  . Smokeless tobacco: Never Used  . Tobacco comment: referred  to smoking  cessation  classes. at  [facility] Use  . Vaping Use: Never used  Substance and Sexual Activity  . Alcohol use: Yes    Comment: occasional  . Drug use: No  . Sexual activity: Not Currently    Partners: Female    Birth control/protection: Surgical    Comment: hysterectomy  Other Topics Concern  . Not on file  Social History Narrative   Moved to [place] from [place], [place] February 2017, to help her daughter.   Lives with her daughter.   Sons live in [place] and [place].   She reports that there were originally 17 children in her family (she is the youngest), the oldest are deceased, some prior to her birth, and she isn't sure which were female/female or how they died.   Social Determinants of Health   Financial Resource Strain:   . Difficulty of Paying Living Expenses:   Food Insecurity:   . Worried About Charity fundraiser in the Last Year:   . Arboriculturist in the Last Year:   Transportation Needs:   . Film/video editor (Medical):   Marland Kitchen Lack of Transportation (Non-Medical):   Physical Activity:   . Days of Exercise per Week:   . Minutes of Exercise per Session:   Stress:   . Feeling of Stress :   Social Connections:   . Frequency of Communication with Friends and Family:   . Frequency of Social Gatherings with Friends and Family:   . Attends Religious Services:   .  Active Member of Clubs or Organizations:   . Attends Archivist Meetings:   Marland Kitchen Marital Status:     Family History  Problem Relation Age of Onset   . Hyperlipidemia Mother   . Hypertension Mother   . Stroke Mother   . Thyroid disease Mother   . Heart attack Mother   . Hyperlipidemia Father   . Hypertension Father   . Stroke Father   . Heart attack Father   . Hypertension Sister   . Stroke Sister   . Thyroid disease Sister   . Breast cancer Sister   . Crohn's disease Sister   . Hypertension Sister   . Hypertension Brother   . Diabetes Brother   . Hypertension Brother     Review of Systems  Constitutional: Negative for chills and fever.  Respiratory: Positive for cough. Negative for shortness of breath and wheezing.   Cardiovascular: Positive for palpitations (occ) and leg swelling (chronic). Negative for chest pain.  Gastrointestinal: Negative for abdominal distention, abdominal pain, blood in stool, constipation, diarrhea and nausea.       Increased gas.  gerd controlled  Neurological: Positive for dizziness (with high sugars), light-headedness and headaches.  Psychiatric/Behavioral: Positive for dysphoric mood. The patient is nervous/anxious.        Objective:   Vitals:   10/03/19 0846  BP: (!) 144/80  Pulse: 69  Temp: 98.4 F (36.9 C)  SpO2: 93%   BP Readings from Last 3 Encounters:  10/03/19 (!) 144/80  09/19/19 104/80  09/16/19 120/68   Wt Readings from Last 3 Encounters:  10/03/19 289 lb (131.1 kg)  09/19/19 286 lb 6.4 oz (129.9 kg)  09/16/19 286 lb (129.7 kg)   Body mass index is 47.22 kg/m.   Physical Exam    Constitutional: Appears well-developed and well-nourished. No distress.  HENT:  Head: Normocephalic and atraumatic.  Neck: Neck supple. No tracheal deviation present. No thyromegaly present.  No cervical lymphadenopathy Cardiovascular: Normal rate, regular rhythm and normal heart sounds.   No murmur heard. No carotid bruit .  No edema Pulmonary/Chest: Effort normal and breath sounds normal. No respiratory distress. No has no wheezes. No rales.  Skin: Skin is warm and dry. Not  diaphoretic.  Psychiatric: Normal mood and affect. Behavior is normal.   Lab Results  Component Value Date   WBC 7.7 09/19/2019   HGB 14.3 09/19/2019   HCT 43.3 09/19/2019   PLT 188.0 09/19/2019   GLUCOSE 592 (HH) 09/19/2019   CHOL 170 09/19/2019   TRIG 221.0 (H) 09/19/2019   HDL 39.90 09/19/2019   LDLDIRECT 101.0 09/19/2019   LDLCALC 83 05/24/2019   ALT 68 (H) 09/19/2019   AST 101 (H) 09/19/2019   NA 131 (L) 09/19/2019   K 4.7 09/19/2019   CL 94 (L) 09/19/2019   CREATININE 0.83 09/19/2019   BUN 12 09/19/2019   CO2 29 09/19/2019   TSH 1.67 11/01/2018   INR 1.06 11/07/2015   HGBA1C 12.3 (H) 09/19/2019   MICROALBUR <0.7 01/15/2016      Assessment & Plan:    See Problem List for Assessment and Plan of chronic medical problems.    This visit occurred during the SARS-CoV-2 public health emergency.  Safety protocols were in place, including screening questions prior to the visit, additional usage of staff PPE, and extensive cleaning of exam room while observing appropriate contact time as indicated for disinfecting solutions.

## 2019-10-03 ENCOUNTER — Other Ambulatory Visit: Payer: Self-pay

## 2019-10-03 ENCOUNTER — Encounter: Payer: Self-pay | Admitting: Internal Medicine

## 2019-10-03 ENCOUNTER — Ambulatory Visit (INDEPENDENT_AMBULATORY_CARE_PROVIDER_SITE_OTHER): Payer: Medicare Other | Admitting: Internal Medicine

## 2019-10-03 VITALS — BP 144/80 | HR 69 | Temp 98.4°F | Ht 65.6 in | Wt 289.0 lb

## 2019-10-03 DIAGNOSIS — G6289 Other specified polyneuropathies: Secondary | ICD-10-CM | POA: Diagnosis not present

## 2019-10-03 DIAGNOSIS — R7401 Elevation of levels of liver transaminase levels: Secondary | ICD-10-CM

## 2019-10-03 DIAGNOSIS — I10 Essential (primary) hypertension: Secondary | ICD-10-CM | POA: Diagnosis not present

## 2019-10-03 DIAGNOSIS — L0292 Furuncle, unspecified: Secondary | ICD-10-CM

## 2019-10-03 DIAGNOSIS — K219 Gastro-esophageal reflux disease without esophagitis: Secondary | ICD-10-CM | POA: Diagnosis not present

## 2019-10-03 DIAGNOSIS — E1165 Type 2 diabetes mellitus with hyperglycemia: Secondary | ICD-10-CM | POA: Diagnosis not present

## 2019-10-03 MED ORDER — INSULIN GLARGINE 100 UNIT/ML SOLOSTAR PEN: 35.0000 [IU] | PEN_INJECTOR | Freq: Every day | SUBCUTANEOUS | 5 refills | Status: DC

## 2019-10-03 MED ORDER — DOXYCYCLINE HYCLATE 100 MG PO TABS: 100.0000 mg | ORAL_TABLET | Freq: Two times a day (BID) | ORAL | 0 refills | Status: DC

## 2019-10-03 MED ORDER — FREESTYLE LIBRE 14 DAY READER DEVI: 0 refills | Status: DC

## 2019-10-03 MED ORDER — FREESTYLE LIBRE 14 DAY SENSOR MISC: 5 refills | Status: DC

## 2019-10-03 NOTE — Assessment & Plan Note (Addendum)
Chronic Uncontrolled insulin-dependent diabetes with hyperglycemia Sugars still very elevated at home Increase Lantus 35 units once daily Stressed decreasing sugars-she does eat cookies and ice cream more than she should Stressed weight loss We will call her in a few days to see how sugars are and adjust the insulin further  She will check sugars 1-2 times daily and as needed for concerning symptoms

## 2019-10-03 NOTE — Assessment & Plan Note (Signed)
Chronic LFTs high  -- likely from fatty liver To see GI lfts did not improve off statin so it was restarted Stressed weight loss

## 2019-10-03 NOTE — Assessment & Plan Note (Signed)
Subacute The boil on her abdomen still appears to be if infected and there is drainage Bactrim did not seem to be effective Start doxycycline twice daily x10 days

## 2019-10-03 NOTE — Assessment & Plan Note (Signed)
Chronic Losartan dose decreased 2 weeks ago because of low blood pressure and lightheadedness Blood pressure okay here today No change in medications-we will monitor BP

## 2019-10-03 NOTE — Assessment & Plan Note (Signed)
Chronic Stressed the importance of weight loss for several reasons-uncontrolled sugars, blood pressure, arthritis, chronic back pain, GERD, etc. Stressed as much exercise as possible Stop eating ice cream, cookies Decrease portions

## 2019-10-03 NOTE — Assessment & Plan Note (Signed)
Chronic She does not feel the gabapentin is helping and wonders if she needs it Advised that we can taper off slowly and she can reevaluate how much it is helping Decrease gabapentin by 1 pill every few days and when she is taking 1 pill 3 times a week take that for 1 week and then decrease further 1 pill every few days until off

## 2019-10-03 NOTE — Assessment & Plan Note (Addendum)
Chronic GERD controlled Continue daily medication-pantoprazole 40 mg twice daily

## 2019-10-06 ENCOUNTER — Other Ambulatory Visit: Payer: Self-pay | Admitting: Internal Medicine

## 2019-10-10 ENCOUNTER — Telehealth: Payer: Self-pay | Admitting: Internal Medicine

## 2019-10-10 DIAGNOSIS — M5136 Other intervertebral disc degeneration, lumbar region: Secondary | ICD-10-CM

## 2019-10-10 NOTE — Telephone Encounter (Signed)
CGM Hassan Rowan with [facility] called. She needs an addendum to chart note from October 03, 2019 re: CGM  1. Number of time patient checks BS daily 2. Number of time patient injects insulin daily  Neither of those can be in the medications list (insurance will not accept) 3. Insulin is taken on a sliding scale  Need signed copy of the note, current note does not reflect signed  Please fax to [phone]

## 2019-10-10 NOTE — Telephone Encounter (Addendum)
    Patient calling for status of diabetic meter/supplies Patient requesting prescription for supplies be fax to [facility] [phone] Phone [phone]

## 2019-10-10 NOTE — Telephone Encounter (Signed)
Waiting for Dr. Quay Burow to update requested info so I can fax it in.

## 2019-10-10 NOTE — Telephone Encounter (Signed)
    Patient requesting referral to pain management clinic. Patient currently going to [facility] , but no longer wants to receive services at that location

## 2019-10-11 DIAGNOSIS — E119 Type 2 diabetes mellitus without complications: Secondary | ICD-10-CM | POA: Diagnosis not present

## 2019-10-11 NOTE — Telephone Encounter (Signed)
ordered

## 2019-10-11 NOTE — Telephone Encounter (Signed)
Note addended

## 2019-10-11 NOTE — Telephone Encounter (Signed)
Message left for patient today 

## 2019-10-11 NOTE — Telephone Encounter (Signed)
Faxed today

## 2019-10-17 ENCOUNTER — Telehealth: Payer: Self-pay | Admitting: Internal Medicine

## 2019-10-17 NOTE — Telephone Encounter (Signed)
Error

## 2019-10-17 NOTE — Telephone Encounter (Signed)
Patient is requesting a referral to be sent to pain clinic. She states they have not received it yet. Fax number :[phone] Patient requesting a call once faxed.  She states they can get her in this week if sent.

## 2019-10-17 NOTE — Telephone Encounter (Addendum)
Form has been completed, But the second page where the provider signs is missing.  LVM to [employer] to call and inform we need the second page of this form.

## 2019-10-17 NOTE — Telephone Encounter (Signed)
[employer] dropped off [employer] paper work. Placed in Tammy Strickland's box.

## 2019-10-19 ENCOUNTER — Telehealth: Payer: Self-pay

## 2019-10-19 DIAGNOSIS — M79641 Pain in right hand: Secondary | ICD-10-CM | POA: Diagnosis not present

## 2019-10-19 DIAGNOSIS — M7062 Trochanteric bursitis, left hip: Secondary | ICD-10-CM | POA: Diagnosis not present

## 2019-10-19 DIAGNOSIS — M25562 Pain in left knee: Secondary | ICD-10-CM | POA: Diagnosis not present

## 2019-10-19 DIAGNOSIS — M5136 Other intervertebral disc degeneration, lumbar region: Secondary | ICD-10-CM | POA: Diagnosis not present

## 2019-10-19 DIAGNOSIS — M25561 Pain in right knee: Secondary | ICD-10-CM | POA: Diagnosis not present

## 2019-10-19 NOTE — Telephone Encounter (Signed)
-----   Message from Carlynn Purl sent at 10/18/2019 12:08 PM EDT ----- Regarding: diabetes Can you call Tammy Strickland she is asking about something that Dr Quay Burow told her to wear for her diabetes at last office visit.

## 2019-10-19 NOTE — Telephone Encounter (Signed)
I returned call to patient today and left message for her to return call to clinic. I let her know that I did have a free-style libre for her if she is not able to get it from her insurance yet. She can just let me know if she wants to pick it up or not.

## 2019-10-19 NOTE — Telephone Encounter (Signed)
[employer] dropped off the other form. It has been completed & Placed in providers box to review and sign.

## 2019-10-20 NOTE — Telephone Encounter (Signed)
Forms have been signed, Copy sent to scan.   LVM for [employer] to inform the forms are ready to be picked up.

## 2019-10-23 ENCOUNTER — Other Ambulatory Visit: Payer: Self-pay | Admitting: Internal Medicine

## 2019-10-25 ENCOUNTER — Telehealth: Payer: Self-pay

## 2019-10-25 DIAGNOSIS — E1165 Type 2 diabetes mellitus with hyperglycemia: Secondary | ICD-10-CM

## 2019-10-25 MED ORDER — INSULIN GLARGINE 100 UNIT/ML SOLOSTAR PEN: 40.0000 [IU] | PEN_INJECTOR | Freq: Every day | SUBCUTANEOUS | 5 refills | Status: DC

## 2019-10-25 NOTE — Telephone Encounter (Signed)
F/u  The patient is aware someone will call her back with a recommendation from Dr. Quay Burow.  Patient verbalized understanding.

## 2019-10-25 NOTE — Telephone Encounter (Signed)
Hard to say which one is more accurate - probably only needs to use one    Either way sugars are too high.  Increase insulin to 40 units daily.  Referral ordered for endocrine to help with diabetes   She should all next week with an update of her sugars

## 2019-10-25 NOTE — Telephone Encounter (Signed)
F/u  The sensor is saying she needs a new sensor.   The patient is aware that both MD/ CMA are in a room with patient I am unable to pull them out .  Aware a message will be sent around

## 2019-10-25 NOTE — Telephone Encounter (Signed)
New message    The patient voiced getting two different readings when checking her blood sugar. The patient is aware the MD / CMA is in the clinic now seeing patients.    Fingerstick reading 428  Blood sugar machine 302   The patient is aware the call will be transfer over to the [facility] Triage nurse for assessment.   Call transfer and spoke with [facility]

## 2019-10-25 NOTE — Telephone Encounter (Signed)
Message left of patient with Dr. Eilleen Kempf instructions.

## 2019-11-01 NOTE — Telephone Encounter (Signed)
Left message for patient.  Sensor should be fine as this was a new testing kit/sample unit she was given.  We can try and send in a new testing kit to her insurance with the sensor if she likes.

## 2019-11-01 NOTE — Telephone Encounter (Signed)
New message:   Pt is calling and states that her meter is still saying that she needs a new sensor in her machine. Please advise.

## 2019-11-03 ENCOUNTER — Telehealth: Payer: Self-pay | Admitting: Internal Medicine

## 2019-11-03 NOTE — Telephone Encounter (Signed)
New Message:   Pt's friend is calling and asking about a [employer] form to help the pt get cap hours for the pt. She states she dropped the form off more than 2 weeks ago. Please advise.

## 2019-11-05 ENCOUNTER — Other Ambulatory Visit: Payer: Self-pay | Admitting: Internal Medicine

## 2019-11-05 DIAGNOSIS — I10 Essential (primary) hypertension: Secondary | ICD-10-CM

## 2019-11-06 NOTE — Progress Notes (Signed)
Subjective:    Patient ID: Tammy Strickland, female    DOB: October 28, 1960, 59 y.o.   MRN: [id number]  HPI The patient is here for follow up of their chronic medical problems, including diabetes, htn, elevated lfts  She is taking 20 units of insulin twice daily.  Her sugars at home have been in the 300-400's.  She states she has been fairly compliant with a diabetic diet, but admits to eating an oatmeal pie yesterday.  Her right side hurts - it started a couple of months ago.  The pain is at her lateral-posterior ribs is sore when she is still and increases with coughing and movement.  The pain is constant.  She has pain in her right groin area at times.  It is worse with changing positions and coughing.  She feels like she has to hold the area in.  She is concerned at all related to her kidney.  She does state urinary frequency.  Medications and allergies reviewed with patient and updated if appropriate.  Patient Active Problem List   Diagnosis Date Noted  . Upper abdominal pain 09/19/2019  . Cough 09/19/2019  . Nausea & vomiting 08/15/2019  . Diarrhea 08/15/2019  . Pancreatitis 05/27/2019  . Severe obesity (BMI >= 40) ([place]) 05/24/2019  . TMJ arthralgia 04/26/2019  . Unilateral primary osteoarthritis, right knee 01/16/2019  . Vaginal yeast infection 11/01/2018  . Anxiety 11/01/2018  . RUQ pain 06/22/2018  . Fatty liver 06/19/2018  . Lactic acid acidosis 06/15/2018  . Transaminitis 06/15/2018  . Chronic bilateral low back pain without sciatica 05/17/2018  . Boil 05/12/2018  . It band syndrome, left 12/29/2017  . Trochanteric bursitis, left hip 12/29/2017  . Dysphagia 04/29/2017  . History of left knee replacement 04/21/2017  . Pain in left ankle and joints of left foot 04/21/2017  . Pain in left hand 04/21/2017  . OSA (obstructive sleep apnea) 02/23/2017  . Acute gouty arthritis 12/25/2016  . Stroke ([place])   . Chronic fatigue 11/09/2016  . Hypoxia 11/09/2016  . Morbidly  obese ([place]) 11/09/2016  . Rash and nonspecific skin eruption 07/21/2016  . Frequent headaches 07/21/2016  . Allergic rhinitis 07/16/2016  . Vitamin D deficiency 04/16/2016  . Encounter for other preprocedural examination 03/12/2016  . Hyperlipidemia 01/15/2016  . Uncontrolled diabetes mellitus ([place]) 11/26/2015  . Chronic diastolic congestive heart failure ([place]) 11/06/2015  . TIA (transient ischemic attack)   . Carotid-cavernous fistula   . Insomnia 10/10/2015  . Depression 10/10/2015  . Benign essential HTN 08/15/2015  . Fibromyalgia 08/15/2015  . Peripheral neuropathy 08/15/2015  . CAD (coronary artery disease) 08/15/2015  . COPD (chronic obstructive pulmonary disease) ([place]) 08/15/2015  . Osteoporosis 08/15/2015  . 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 DDD (degenerative disc disease), lumbar 08/15/2015  . Occupational exposure to industrial toxins 08/15/2015  . GERD (gastroesophageal reflux disease) 08/15/2015    Current Outpatient Medications on File Prior to Visit  Medication Sig Dispense Refill  . ACCU-CHEK GUIDE test strip TEST FOUR TIMES DAILY 100 strip 2  . amitriptyline (ELAVIL) 75 MG tablet Take 1 tablet by mouth at bedtime (Patient taking differently: Take 75 mg by mouth at bedtime. ) 30 tablet 3  . aspirin EC 81 MG tablet Take 1 tablet (81 mg total) by mouth daily.    Marland Kitchen atorvastatin (LIPITOR) 20 MG tablet Take 1 tablet by mouth every day 30 tablet 3  . blood glucose meter kit and supplies KIT Accu-check. Use up to four times daily  as directed. (FOR E11.65). 1 each 0  . cloNIDine (CATAPRES) 0.3 MG tablet Take 1 tablet by mouth every day (Patient taking differently: Take 0.3 mg by mouth daily. ) 30 tablet 6  . Continuous Blood Gluc Receiver (FREESTYLE LIBRE 14 DAY READER) DEVI UAD for IDDM with hyperglycemia 1 each 0  . Continuous Blood Gluc Sensor (FREESTYLE LIBRE 14 DAY SENSOR) MISC UAD for IDDM with hyperglycemia 2 each 5  . fluticasone (FLONASE) 50 MCG/ACT nasal spray Shake  liquid & use 1 spray into each nostril every day as needed for allergies (Patient taking differently: Place 1 spray into both nostrils daily as needed for allergies or rhinitis. ) 16 g 11  . furosemide (LASIX) 80 MG tablet Take 1 tablet by mouth every day (Patient taking differently: Take 80 mg by mouth daily. ) 30 tablet 6  . gabapentin (NEURONTIN) 600 MG tablet Take 2 tablets by mouth 3 times a day (Patient taking differently: Take 1,200 mg by mouth 2 (two) times daily. ) 180 tablet 11  . glipiZIDE (GLUCOTROL) 5 MG tablet Take 5 mg by mouth 2 (two) times daily.    . insulin glargine (LANTUS) 100 UNIT/ML Solostar Pen Inject 40 Units into the skin daily. 15 mL 5  . Insulin Pen Needle (PEN NEEDLES) 32G X 5 MM MISC UAD daily for insulin 90 each 1  . Lancets MISC Test four times per day as directed 300 each 1  . losartan (COZAAR) 25 MG tablet Take 1 tablet (25 mg total) by mouth daily. 90 tablet 1  . losartan (COZAAR) 50 MG tablet Take 50 mg by mouth 2 (two) times daily.    . metoprolol (TOPROL-XL) 200 MG 24 hr tablet Take 1 tablet by mouth twice daily (Patient taking differently: Take 200 mg by mouth in the morning and at bedtime. ) 60 tablet 6  . nitroGLYCERIN (NITROSTAT) 0.4 MG SL tablet Place 0.4 mg under the tongue every 5 (five) minutes as needed for chest pain.    Marland Kitchen omeprazole (PRILOSEC) 40 MG capsule Take 40 mg by mouth daily.    Marland Kitchen OVER THE COUNTER MEDICATION Take 1 tablet by mouth at bedtime as needed (sleep). Sleep Aid    . oxyCODONE-acetaminophen (PERCOCET) 10-325 MG tablet Take 1 tablet by mouth every 4 (four) hours as needed.    . pantoprazole (PROTONIX) 40 MG tablet Take 1 tablet (40 mg total) by mouth 2 (two) times daily. 60 tablet 3  . potassium chloride SA (KLOR-CON) 20 MEQ tablet Take 1 tablet by mouth every day (Patient taking differently: Take 20 mEq by mouth daily. ) 30 tablet 6  . SYMBICORT 80-4.5 MCG/ACT inhaler Inhale 2 puffs into the lungs daily as needed (Patient taking  differently: Inhale 2 puffs into the lungs daily as needed (wheezing/SOB). ) 1 Inhaler 11  . tiZANidine (ZANAFLEX) 4 MG tablet Take 1 tablet by mouth at bedtime 30 tablet 11   No current facility-administered medications on file prior to visit.    Past Medical History:  Diagnosis Date  . Anxiety   . Arthritis   . Asthma   . CHF (congestive heart failure) ([place])   . COPD (chronic obstructive pulmonary disease) ([place])   . Coronary artery disease   . Depression   . Diabetes mellitus without complication ([place])    type 2   . Fibromyalgia   . Gallstones   . GERD (gastroesophageal reflux disease)   . HLD (hyperlipidemia)   . Hypertension   . Lupus ([place])   .  Migraines   . Neuromuscular disorder ([place])   . Osteoporosis   . Oxygen deficiency    pt uses 2.5L 02 at night,(08/25/2019)pt states she no longer uses oxygen, hasn't used in 7-8 years  . Pancreatitis   . Peripheral neuropathy   . Shingles   . Sleep apnea    had sleep study done recently ; unaware if she will be getting  a CPAP device ; patient states "im pretty sure i have it , i fall alseep all the time "  . Stroke [facility]) 10/2015    Past Surgical History:  Procedure Laterality Date  . ABDOMINAL HYSTERECTOMY     ovaries left  . APPENDECTOMY    . BREAST LUMPECTOMY WITH RADIOACTIVE SEED LOCALIZATION Left 06/14/2019   Procedure: LEFT BREAST LUMPECTOMY WITH RADIOACTIVE SEED LOCALIZATION X 2;  Surgeon: Coralie Keens, MD;  Location: [facility];  Service: General;  Laterality: Left;  LMA  . CHOLECYSTECTOMY    . IR GENERIC HISTORICAL  11/07/2015   IR ANGIO INTRA EXTRACRAN SEL COM CAROTID INNOMINATE BILAT MOD SED 11/07/2015 Luanne Bras, MD [facility]  . IR GENERIC HISTORICAL  11/07/2015   IR ANGIO VERTEBRAL SEL SUBCLAVIAN INNOMINATE UNI R MOD SED 11/07/2015 Luanne Bras, MD [facility]  . IR GENERIC HISTORICAL  11/07/2015   IR ANGIO VERTEBRAL SEL VERTEBRAL UNI L MOD SED 11/07/2015 Luanne Bras, MD [facility]  . IR  GENERIC HISTORICAL  11/07/2015   IR ANGIOGRAM EXTREMITY LEFT 11/07/2015 Luanne Bras, MD [facility]  . TONSILLECTOMY    . TOTAL KNEE ARTHROPLASTY Left 03/12/2017   Procedure: LEFT TOTAL KNEE ARTHROPLASTY;  Surgeon: Mcarthur Rossetti, MD;  Location: [facility];  Service: Orthopedics;  Laterality: Left;  Adductor Block    Social History   Socioeconomic History  . Marital status: Single    Spouse name: n/a  . Number of children: 3  . Years of education: 12+  . Highest education level: Not on file  Occupational History  . 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 Smoking status: Current Every Day Smoker    Packs/day: 0.25    Years: 35.00    Pack years: 8.75    Types: Cigarettes  . Smokeless tobacco: Never Used  . Tobacco comment: referred  to smoking  cessation  classes. at  [facility] Use  . Vaping Use: Never used  Substance and Sexual Activity  . Alcohol use: Yes    Comment: occasional  . Drug use: No  . Sexual activity: Not Currently    Partners: Female    Birth control/protection: Surgical    Comment: hysterectomy  Other Topics Concern  . Not on file  Social History Narrative   Moved to [place] from [place], [place] February 2017, to help her daughter.   Lives with her daughter.   Sons live in [place] and [place].   She reports that there were originally 17 children in her family (she is the youngest), the oldest are deceased, some prior to her birth, and she isn't sure which were female/female or how they died.   Social Determinants of Health   Financial Resource Strain:   . Difficulty of Paying Living Expenses:   Food Insecurity:   . Worried About Charity fundraiser in the Last Year:   . Arboriculturist in the Last Year:   Transportation Needs:   . Film/video editor (Medical):   Marland Kitchen  Lack of Transportation (Non-Medical):   Physical Activity:   . Days of  Exercise per Week:   . Minutes of Exercise per Session:   Stress:   . Feeling of Stress :   Social Connections:   . Frequency of Communication with Friends and Family:   . Frequency of Social Gatherings with Friends and Family:   . Attends Religious Services:   . Active Member of Clubs or Organizations:   . Attends Archivist Meetings:   Marland Kitchen Marital Status:     Family History  Problem Relation Age of Onset  . Hyperlipidemia Mother   . Hypertension Mother   . Stroke Mother   . Thyroid disease Mother   . Heart attack Mother   . Hyperlipidemia Father   . Hypertension Father   . Stroke Father   . Heart attack Father   . Hypertension Sister   . Stroke Sister   . Thyroid disease Sister   . Breast cancer Sister   . Crohn's disease Sister   . Hypertension Sister   . Hypertension Brother   . Diabetes Brother   . Hypertension Brother     Review of Systems  Constitutional: Negative for chills and fever.  HENT: Negative for trouble swallowing.   Respiratory: Positive for cough (dry). Negative for shortness of breath and wheezing.   Cardiovascular: Negative for palpitations and leg swelling.  Gastrointestinal: Positive for abdominal pain (upper abdomen) and nausea (occ). Negative for constipation and diarrhea.       Gerd controlled  Genitourinary: Positive for frequency. Negative for dysuria and hematuria.       No urine discoloration or odor  Psychiatric/Behavioral:       Panic attacks       Objective:   Vitals:   11/07/19 0753  BP: (!) 144/82  Pulse: 62  Temp: 98.2 F (36.8 C)  SpO2: 99%   BP Readings from Last 3 Encounters:  11/07/19 (!) 144/82  10/03/19 (!) 144/80  09/19/19 104/80   Wt Readings from Last 3 Encounters:  11/07/19 (!) 294 lb (133.4 kg)  10/03/19 289 lb (131.1 kg)  09/19/19 286 lb 6.4 oz (129.9 kg)   Body mass index is 48.03 kg/m.   Physical Exam    Constitutional: Appears well-developed and well-nourished. No distress.  HENT:    Head: Normocephalic and atraumatic.  Neck: Neck supple. No tracheal deviation present. No thyromegaly present.  No cervical lymphadenopathy Cardiovascular: Normal rate, regular rhythm and normal heart sounds.   No murmur heard. No carotid bruit .  No edema Pulmonary/Chest: Effort normal and breath sounds normal. No respiratory distress. No has no wheezes. No rales. Abdomen: Obese, soft, no obvious hernia in right groin, no increased tenderness in the upper abdomen with palpation Skin: Skin is warm and dry. Not diaphoretic.  Psychiatric: Normal mood and affect. Behavior is normal.      Assessment & Plan:    See Problem List for Assessment and Plan of chronic medical problems.    This visit occurred during the SARS-CoV-2 public health emergency.  Safety protocols were in place, including screening questions prior to the visit, additional usage of staff PPE, and extensive cleaning of exam room while observing appropriate contact time as indicated for disinfecting solutions.

## 2019-11-06 NOTE — Patient Instructions (Addendum)
Call Dr Loanne Drilling to schedule an appointment for your diabetes - [phone]    Blood work was ordered.     Medications reviewed and updated.  Changes include :   Increase insulin to 30 units once daily and 20  Units in the evening then after 3 days increase to 30 units twice daily.   Your prescription(s) have been submitted to your pharmacy. Please take as directed and contact our office if you believe you are having problem(s) with the medication(s).   A referral was ordered for surgery.     Please followup in 3 months

## 2019-11-07 ENCOUNTER — Encounter: Payer: Self-pay | Admitting: Internal Medicine

## 2019-11-07 ENCOUNTER — Telehealth: Payer: Self-pay

## 2019-11-07 ENCOUNTER — Other Ambulatory Visit: Payer: Self-pay

## 2019-11-07 ENCOUNTER — Ambulatory Visit (INDEPENDENT_AMBULATORY_CARE_PROVIDER_SITE_OTHER): Payer: Medicare Other | Admitting: Internal Medicine

## 2019-11-07 VITALS — BP 144/82 | HR 62 | Temp 98.2°F | Ht 65.6 in | Wt 294.0 lb

## 2019-11-07 DIAGNOSIS — R1031 Right lower quadrant pain: Secondary | ICD-10-CM | POA: Diagnosis not present

## 2019-11-07 DIAGNOSIS — R350 Frequency of micturition: Secondary | ICD-10-CM

## 2019-11-07 DIAGNOSIS — E1165 Type 2 diabetes mellitus with hyperglycemia: Secondary | ICD-10-CM | POA: Diagnosis not present

## 2019-11-07 DIAGNOSIS — I10 Essential (primary) hypertension: Secondary | ICD-10-CM | POA: Diagnosis not present

## 2019-11-07 DIAGNOSIS — R0781 Pleurodynia: Secondary | ICD-10-CM | POA: Diagnosis not present

## 2019-11-07 DIAGNOSIS — R7401 Elevation of levels of liver transaminase levels: Secondary | ICD-10-CM | POA: Diagnosis not present

## 2019-11-07 LAB — POC URINALSYSI DIPSTICK (AUTOMATED)
Bilirubin, UA: NEGATIVE
Blood, UA: NEGATIVE
Glucose, UA: NEGATIVE
Ketones, UA: NEGATIVE
Leukocytes, UA: NEGATIVE
Nitrite, UA: NEGATIVE
Protein, UA: NEGATIVE
Spec Grav, UA: 1.015 (ref 1.010–1.025)
Urobilinogen, UA: 0.2 E.U./dL
pH, UA: 6 (ref 5.0–8.0)

## 2019-11-07 MED ORDER — INSULIN GLARGINE 100 UNIT/ML SOLOSTAR PEN: 30.0000 [IU] | PEN_INJECTOR | Freq: Two times a day (BID) | SUBCUTANEOUS | 5 refills | Status: DC

## 2019-11-07 NOTE — Telephone Encounter (Signed)
Called and left message for patient. Paperwork faxed today at 3:22 and fax conformation received. Paperwork had already been completed on 10/19/19.  Also for sensor not sure what she needs to do as I have given her the last sample of the free style Elenor Legato that we had.  We can fax in a new set of sensors to see if insurance will cover it or not.

## 2019-11-07 NOTE — Assessment & Plan Note (Signed)
Acute ?  Hernia-has pain in that area that is worse with changes in movement, coughing, straining Will refer to surgery for further evaluation Nothing seen on CT scan 2 months ago

## 2019-11-07 NOTE — Assessment & Plan Note (Signed)
Chronic Blood pressure adequately controlled Will recheck at her next visit and if it is still slightly elevated will increase her medication again-this was decreased couple of months ago for low BP For now continue current medications CMP

## 2019-11-07 NOTE — Telephone Encounter (Signed)
New message    The patient was seen today asking the CMA to call her back today.

## 2019-11-07 NOTE — Assessment & Plan Note (Signed)
Chronic CMP today Has known hepatic steatosis Stressed weight loss

## 2019-11-07 NOTE — Assessment & Plan Note (Signed)
Subacute Still experiencing pain on right side of ribs from posterior-lateral ribs Worse with coughing, movement No trauma Possibly thoracic radiculopathy, definitely muscular skeletal in nature Already on gabapentin, muscle relaxer and narcotic Will discuss with pain management

## 2019-11-07 NOTE — Assessment & Plan Note (Signed)
Acute Urine dip here today negative for infection-we will not send off for culture Other pain symptoms not consistent with UTI or kidney problem

## 2019-11-07 NOTE — Telephone Encounter (Signed)
   Patient states sensor is saying " sensor no good, start new sensor"  Also, patients wants to know if "paperwork" has been faxed

## 2019-11-07 NOTE — Assessment & Plan Note (Signed)
Chronic Uncontrolled I do think she is having more diet indiscretions than she is stating Stressed compliance with a diabetic diet Sugars still in 300s-400s Increase insulin by 10 units over the next few days and then increase another 10 units after that-to be taking 30 units twice daily Encouraged as much exercise as possible We will call endocrine to set up an appointment

## 2019-11-07 NOTE — Telephone Encounter (Signed)
Patient was seen in the office today. Paperwork brought in with her.

## 2019-11-08 LAB — COMPLETE METABOLIC PANEL WITH GFR
AG Ratio: 1.1 (calc) (ref 1.0–2.5)
ALT: 52 U/L — ABNORMAL HIGH (ref 6–29)
AST: 100 U/L — ABNORMAL HIGH (ref 10–35)
Albumin: 3.8 g/dL (ref 3.6–5.1)
Alkaline phosphatase (APISO): 155 U/L — ABNORMAL HIGH (ref 37–153)
BUN: 9 mg/dL (ref 7–25)
CO2: 33 mmol/L — ABNORMAL HIGH (ref 20–32)
Calcium: 9 mg/dL (ref 8.6–10.4)
Chloride: 96 mmol/L — ABNORMAL LOW (ref 98–110)
Creat: 0.64 mg/dL (ref 0.50–1.05)
GFR, Est African American: 115 mL/min/{1.73_m2} (ref >=60)
GFR, Est Non African American: 99 mL/min/{1.73_m2} (ref >=60)
Globulin: 3.4 g/dL (calc) (ref 1.9–3.7)
Glucose, Bld: 265 mg/dL — ABNORMAL HIGH (ref 65–99)
Potassium: 4.5 mmol/L (ref 3.5–5.3)
Sodium: 137 mmol/L (ref 135–146)
Total Bilirubin: 0.6 mg/dL (ref 0.2–1.2)
Total Protein: 7.2 g/dL (ref 6.1–8.1)

## 2019-11-08 LAB — HEMOGLOBIN A1C
Hgb A1c MFr Bld: 13.5 % of total Hgb — ABNORMAL HIGH (ref <5.7)
Mean Plasma Glucose: 341 (calc)
eAG (mmol/L): 18.9 (calc)

## 2019-11-09 ENCOUNTER — Telehealth: Payer: Self-pay

## 2019-11-09 ENCOUNTER — Encounter: Payer: Self-pay | Admitting: Endocrinology

## 2019-11-09 ENCOUNTER — Other Ambulatory Visit: Payer: Self-pay

## 2019-11-09 ENCOUNTER — Ambulatory Visit (INDEPENDENT_AMBULATORY_CARE_PROVIDER_SITE_OTHER): Payer: Medicare Other | Admitting: Endocrinology

## 2019-11-09 VITALS — BP 124/86 | HR 62 | Ht 65.0 in | Wt 291.4 lb

## 2019-11-09 DIAGNOSIS — Z1159 Encounter for screening for other viral diseases: Secondary | ICD-10-CM | POA: Diagnosis not present

## 2019-11-09 DIAGNOSIS — M542 Cervicalgia: Secondary | ICD-10-CM | POA: Diagnosis not present

## 2019-11-09 DIAGNOSIS — Z794 Long term (current) use of insulin: Secondary | ICD-10-CM

## 2019-11-09 DIAGNOSIS — Z79899 Other long term (current) drug therapy: Secondary | ICD-10-CM | POA: Diagnosis not present

## 2019-11-09 DIAGNOSIS — M546 Pain in thoracic spine: Secondary | ICD-10-CM | POA: Diagnosis not present

## 2019-11-09 DIAGNOSIS — F17210 Nicotine dependence, cigarettes, uncomplicated: Secondary | ICD-10-CM | POA: Diagnosis not present

## 2019-11-09 DIAGNOSIS — M545 Low back pain: Secondary | ICD-10-CM | POA: Diagnosis not present

## 2019-11-09 DIAGNOSIS — E1142 Type 2 diabetes mellitus with diabetic polyneuropathy: Secondary | ICD-10-CM | POA: Diagnosis not present

## 2019-11-09 DIAGNOSIS — E109 Type 1 diabetes mellitus without complications: Secondary | ICD-10-CM | POA: Diagnosis not present

## 2019-11-09 DIAGNOSIS — M25561 Pain in right knee: Secondary | ICD-10-CM | POA: Diagnosis not present

## 2019-11-09 DIAGNOSIS — M129 Arthropathy, unspecified: Secondary | ICD-10-CM | POA: Diagnosis not present

## 2019-11-09 DIAGNOSIS — Z20822 Contact with and (suspected) exposure to covid-19: Secondary | ICD-10-CM | POA: Diagnosis not present

## 2019-11-09 DIAGNOSIS — M25562 Pain in left knee: Secondary | ICD-10-CM | POA: Diagnosis not present

## 2019-11-09 DIAGNOSIS — E559 Vitamin D deficiency, unspecified: Secondary | ICD-10-CM | POA: Diagnosis not present

## 2019-11-09 DIAGNOSIS — G8929 Other chronic pain: Secondary | ICD-10-CM | POA: Diagnosis not present

## 2019-11-09 MED ORDER — TOUJEO MAX SOLOSTAR 300 UNIT/ML ~~LOC~~ SOPN: 100.0000 [IU] | PEN_INJECTOR | SUBCUTANEOUS | 11 refills | Status: DC

## 2019-11-09 NOTE — Progress Notes (Signed)
Subjective:    Patient ID: Tammy Strickland, female    DOB: [birth year], 58 y.o.   MRN: [id number]  HPI pt is referred by Dr Quay Burow, for diabetes.  Pt states DM was dx'ed in 5697; it is complicated by PN; she has been on insulin since 2017; pt says her diet is fair; exercise is limited by OA; she has never had GDM, pancreatic surgery, severe hypoglycemia or DKA. she brings her meter with her cbg's which I have reviewed today.  cbg varies from 259-531.   Past Medical History:  Diagnosis Date  . Anxiety   . Arthritis   . Asthma   . CHF (congestive heart failure) ([place])   . COPD (chronic obstructive pulmonary disease) ([place])   . Coronary artery disease   . Depression   . Diabetes mellitus without complication ([place])    type 2   . Fibromyalgia   . Gallstones   . GERD (gastroesophageal reflux disease)   . HLD (hyperlipidemia)   . Hypertension   . Lupus ([place])   . Migraines   . Neuromuscular disorder ([place])   . Osteoporosis   . Oxygen deficiency    pt uses 2.5L 02 at night,(08/25/2019)pt states she no longer uses oxygen, hasn't used in 7-8 years  . Pancreatitis   . Peripheral neuropathy   . Shingles   . Sleep apnea    had sleep study done recently ; unaware if she will be getting  a CPAP device ; patient states "im pretty sure i have it , i fall alseep all the time "  . Stroke [facility]) 10/2015    Past Surgical History:  Procedure Laterality Date  . ABDOMINAL HYSTERECTOMY     ovaries left  . APPENDECTOMY    . BREAST LUMPECTOMY WITH RADIOACTIVE SEED LOCALIZATION Left 06/14/2019   Procedure: LEFT BREAST LUMPECTOMY WITH RADIOACTIVE SEED LOCALIZATION X 2;  Surgeon: Coralie Keens, MD;  Location: [facility];  Service: General;  Laterality: Left;  LMA  . CHOLECYSTECTOMY    . IR GENERIC HISTORICAL  11/07/2015   IR ANGIO INTRA EXTRACRAN SEL COM CAROTID INNOMINATE BILAT MOD SED 11/07/2015 Luanne Bras, MD [facility]  . IR GENERIC HISTORICAL  11/07/2015   IR ANGIO VERTEBRAL SEL SUBCLAVIAN  INNOMINATE UNI R MOD SED 11/07/2015 Luanne Bras, MD [facility]  . IR GENERIC HISTORICAL  11/07/2015   IR ANGIO VERTEBRAL SEL VERTEBRAL UNI L MOD SED 11/07/2015 Luanne Bras, MD [facility]  . IR GENERIC HISTORICAL  11/07/2015   IR ANGIOGRAM EXTREMITY LEFT 11/07/2015 Luanne Bras, MD [facility]  . TONSILLECTOMY    . TOTAL KNEE ARTHROPLASTY Left 03/12/2017   Procedure: LEFT TOTAL KNEE ARTHROPLASTY;  Surgeon: Mcarthur Rossetti, MD;  Location: [facility];  Service: Orthopedics;  Laterality: Left;  Adductor Block    Social History   Socioeconomic History  . Marital status: Single    Spouse name: n/a  . Number of children: 3  . Years of education: 12+  . Highest education level: Not on file  Occupational History  . 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 Smoking status: Current Every Day Smoker    Packs/day: 0.25    Years: 35.00    Pack years: 8.75    Types: Cigarettes  . Smokeless tobacco: Never Used  . Tobacco comment: referred  to smoking  cessation  classes. at  [facility] Use  . Vaping Use: Never used  Substance and Sexual Activity  . Alcohol use: Yes    Comment: occasional  . Drug use: No  . Sexual activity: Not Currently    Partners: Female    Birth control/protection: Surgical    Comment: hysterectomy  Other Topics Concern  . Not on file  Social History Narrative   Moved to [place] from [place], [place] February 2017, to help her daughter.   Lives with her daughter.   Sons live in [place] and [place].   She reports that there were originally 17 children in her family (she is the youngest), the oldest are deceased, some prior to her birth, and she isn't sure which were female/female or how they died.   Social Determinants of Health   Financial Resource Strain:   . Difficulty of Paying Living Expenses:   Food Insecurity:   . Worried About Ship broker in the Last Year:   . Arboriculturist in the Last Year:   Transportation Needs:   . Film/video editor (Medical):   Marland Kitchen Lack of Transportation (Non-Medical):   Physical Activity:   . Days of Exercise per Week:   . Minutes of Exercise per Session:   Stress:   . Feeling of Stress :   Social Connections:   . Frequency of Communication with Friends and Family:   . Frequency of Social Gatherings with Friends and Family:   . Attends Religious Services:   . Active Member of Clubs or Organizations:   . Attends Archivist Meetings:   Marland Kitchen Marital Status:   Intimate Partner Violence:   . Fear of Current or Ex-Partner:   . Emotionally Abused:   Marland Kitchen Physically Abused:   . Sexually Abused:     Current Outpatient Medications on File Prior to Visit  Medication Sig Dispense Refill  . ACCU-CHEK GUIDE test strip TEST FOUR TIMES DAILY (Patient taking differently: 1 each by Other route 4 (four) times daily. E11.65) 100 strip 2  . Accu-Chek Softclix Lancets lancets 1 each by Other route 4 (four) times daily. E11.65    . amitriptyline (ELAVIL) 75 MG tablet Take 1 tablet by mouth at bedtime 30 tablet 5  . aspirin EC 81 MG tablet Take 1 tablet (81 mg total) by mouth daily.    . Blood Glucose Monitoring Suppl (ACCU-CHEK GUIDE) w/Device KIT 1 each by Does not apply route 4 (four) times daily. E11.65    . cloNIDine (CATAPRES) 0.3 MG tablet Take 1 tablet by mouth every day 30 tablet 5  . fluticasone (FLONASE) 50 MCG/ACT nasal spray Shake liquid & use 1 spray into each nostril every day as needed for allergies (Patient taking differently: Place 1 spray into both nostrils as needed for allergies or rhinitis. ) 16 g 11  . furosemide (LASIX) 80 MG tablet Take 1 tablet by mouth every day 30 tablet 5  . gabapentin (NEURONTIN) 600 MG tablet Take 2 tablets by mouth 3 times a day (Patient taking differently: Take 1,200 mg by mouth 3 (three) times daily. ) 180 tablet 11  . Insulin Pen Needle (PEN  NEEDLES) 32G X 5 MM MISC UAD daily for insulin (Patient taking differently: 1 each by Other route daily. E11.65) 90 each 1  . losartan (COZAAR) 25 MG tablet Take 1 tablet (25 mg total) by mouth daily. 90 tablet 1  . losartan (COZAAR) 50 MG tablet Take 1 tablet by mouth twice daily 60 tablet 5  . metoprolol (TOPROL-XL) 200 MG 24 hr tablet Take  1 tablet by mouth twice daily 60 tablet 5  . nitroGLYCERIN (NITROSTAT) 0.4 MG SL tablet Place 0.4 mg under the tongue every 5 (five) minutes as needed for chest pain.    Marland Kitchen oxyCODONE-acetaminophen (PERCOCET) 10-325 MG tablet Take 1 tablet by mouth every 4 (four) hours as needed.    . pantoprazole (PROTONIX) 40 MG tablet Take 1 tablet (40 mg total) by mouth 2 (two) times daily. 60 tablet 3  . potassium chloride SA (KLOR-CON) 20 MEQ tablet Take 1 tablet by mouth every day 30 tablet 5  . SYMBICORT 80-4.5 MCG/ACT inhaler Inhale 2 puffs into the lungs daily as needed (Patient taking differently: Inhale 2 puffs into the lungs as needed (wheezing/SOB). ) 1 Inhaler 11  . tiZANidine (ZANAFLEX) 4 MG tablet Take 1 tablet by mouth at bedtime 30 tablet 11   No current facility-administered medications on file prior to visit.    Allergies  Allergen Reactions  . Metformin And Related Other (See Comments)    Lactic acidosis  . Other Other (See Comments)    H/o pancreatitis  . Ozempic (0.25 Or 0.5 Mg-Dose) [Semaglutide(0.25 Or 0.50m-Dos)]     H/o pancreatitis  . Bacitracin Hives    Family History  Problem Relation Age of Onset  . Hyperlipidemia Mother   . Hypertension Mother   . Stroke Mother   . Thyroid disease Mother   . Heart attack Mother   . Hyperlipidemia Father   . Hypertension Father   . Stroke Father   . Heart attack Father   . Hypertension Sister   . Stroke Sister   . Thyroid disease Sister   . Breast cancer Sister   . Crohn's disease Sister   . Hypertension Sister   . Hypertension Brother   . Diabetes Brother   . Hypertension Brother      BP (!) 124/86   Pulse 62   Ht _0  (1.651 m)   Wt (!) 291 lb 6.4 oz (132.2 kg)   SpO2 92%   BMI 48.49 kg/m   Review of Systems denies weight loss, blurry vision, chest pain, sob, n/v, memory loss, and depression.  She has chronic arthralgias.  She has polyuria.      Objective:   Physical Exam VS: see vs page GEN: no distress HEAD: head: no deformity eyes: no periorbital swelling, no proptosis external nose and ears are normal NECK: supple, thyroid is not enlarged CHEST WALL: no deformity LUNGS: clear to auscultation CV: reg rate and rhythm, no murmur MUSCULOSKELETAL: muscle bulk and strength are grossly normal.  no obvious joint swelling.  gait is normal and steady EXTEMITIES: no deformity.  no ulcer on the feet.  feet are of normal color and temp.  1+ bilat leg edema.  Ext: there is bilateral onychomycosis of the toenails.  Several toenails are absent PULSES: dorsalis pedis intact bilat.  no carotid bruit NEURO:  cn 2-12 grossly intact.   readily moves all 4's.  sensation is intact to touch on the feet, but decreased from normal SKIN:  Normal texture and temperature.  No rash or suspicious lesion is visible.   NODES:  None palpable at the neck PSYCH: alert, well-oriented.  Does not appear anxious nor depressed.   Lab Results  Component Value Date   HGBA1C 13.5 (H) 11/07/2019    I have reviewed outside records, and summarized: Pt was noted to have elevated A1c, and referred here.  She was admitted 2/21 with pancreatitis.  She was on Ozempic at the  time.      Assessment & Plan:  Insulin-requiring type 2 DM: severe exacerbation Pancreatitis: uncertain if This limits rx options, but we'll need to avoid GLP rx.   Patient Instructions  good diet and exercise significantly improve the control of your diabetes.  please let me know if you wish to be referred to a dietician.  high blood sugar is very risky to your health.  you should see an eye doctor and dentist every  year.  It is very important to get all recommended vaccinations.  Controlling your blood pressure and cholesterol drastically reduces the damage diabetes does to your body.  Those who smoke should quit.  Please discuss these with your doctor.  check your blood sugar twice a day.  vary the time of day when you check, between before the 3 meals, and at bedtime.  also check if you have symptoms of your blood sugar being too high or too low.  please keep a record of the readings and bring it to your next appointment here (or you can bring the meter itself).  You can write it on any piece of paper.  please call us sooner if your blood sugar goes below 70, or if you have a lot of readings over 200. I have sent a prescription to your pharmacy, to increase the insulin to 100 units each morning (the new pens are super-concentrated).  Please call or message [place] next week, to tell us how the blood sugar is doing.   Please come back for a follow-up appointment in 6 weeks.

## 2019-11-09 NOTE — Telephone Encounter (Signed)
New message    Checking on the status of referral.

## 2019-11-09 NOTE — Patient Instructions (Signed)
good diet and exercise significantly improve the control of your diabetes.  please let me know if you wish to be referred to a dietician.  high blood sugar is very risky to your health.  you should see an eye doctor and dentist every year.  It is very important to get all recommended vaccinations.  Controlling your blood pressure and cholesterol drastically reduces the damage diabetes does to your body.  Those who smoke should quit.  Please discuss these with your doctor.  check your blood sugar twice a day.  vary the time of day when you check, between before the 3 meals, and at bedtime.  also check if you have symptoms of your blood sugar being too high or too low.  please keep a record of the readings and bring it to your next appointment here (or you can bring the meter itself).  You can write it on any piece of paper.  please call us sooner if your blood sugar goes below 70, or if you have a lot of readings over 200. I have sent a prescription to your pharmacy, to increase the insulin to 100 units each morning (the new pens are super-concentrated).  Please call or message [place] next week, to tell us how the blood sugar is doing.   Please come back for a follow-up appointment in 6 weeks.

## 2019-11-13 ENCOUNTER — Telehealth: Payer: Self-pay

## 2019-11-13 NOTE — Telephone Encounter (Signed)
New message    Dr Loanne Drilling changed insulin insulin glargine, 2 Unit Dial, (TOUJEO MAX SOLOSTAR) 300 UNIT/ML Solostar Pen.  To monitor if blood sugar goes down.   He advise the patient to finished up Dr. Quay Burow insulin pens   Asking for a call back.

## 2019-11-13 NOTE — Telephone Encounter (Signed)
Message left for patient to follow [place]'s recommendations.

## 2019-11-14 NOTE — Telephone Encounter (Signed)
Left a vm at [facility] for them to call pt

## 2019-11-16 DIAGNOSIS — E109 Type 1 diabetes mellitus without complications: Secondary | ICD-10-CM | POA: Diagnosis not present

## 2019-11-16 DIAGNOSIS — M1711 Unilateral primary osteoarthritis, right knee: Secondary | ICD-10-CM | POA: Diagnosis not present

## 2019-11-16 DIAGNOSIS — M47816 Spondylosis without myelopathy or radiculopathy, lumbar region: Secondary | ICD-10-CM | POA: Diagnosis not present

## 2019-11-16 DIAGNOSIS — F17210 Nicotine dependence, cigarettes, uncomplicated: Secondary | ICD-10-CM | POA: Diagnosis not present

## 2019-11-16 DIAGNOSIS — M47814 Spondylosis without myelopathy or radiculopathy, thoracic region: Secondary | ICD-10-CM | POA: Diagnosis not present

## 2019-11-16 DIAGNOSIS — R945 Abnormal results of liver function studies: Secondary | ICD-10-CM | POA: Diagnosis not present

## 2019-11-16 DIAGNOSIS — M47812 Spondylosis without myelopathy or radiculopathy, cervical region: Secondary | ICD-10-CM | POA: Diagnosis not present

## 2019-11-16 DIAGNOSIS — Z79899 Other long term (current) drug therapy: Secondary | ICD-10-CM | POA: Diagnosis not present

## 2019-11-20 ENCOUNTER — Other Ambulatory Visit: Payer: Self-pay | Admitting: Gastroenterology

## 2019-11-20 DIAGNOSIS — R768 Other specified abnormal immunological findings in serum: Secondary | ICD-10-CM | POA: Diagnosis not present

## 2019-11-20 DIAGNOSIS — K7400 Hepatic fibrosis, unspecified: Secondary | ICD-10-CM | POA: Diagnosis not present

## 2019-11-20 DIAGNOSIS — R945 Abnormal results of liver function studies: Secondary | ICD-10-CM | POA: Diagnosis not present

## 2019-11-20 DIAGNOSIS — R760 Raised antibody titer: Secondary | ICD-10-CM | POA: Diagnosis not present

## 2019-11-20 DIAGNOSIS — R1013 Epigastric pain: Secondary | ICD-10-CM | POA: Diagnosis not present

## 2019-11-28 DIAGNOSIS — R760 Raised antibody titer: Secondary | ICD-10-CM | POA: Diagnosis not present

## 2019-11-29 DIAGNOSIS — Z01818 Encounter for other preprocedural examination: Secondary | ICD-10-CM | POA: Diagnosis not present

## 2019-11-30 DIAGNOSIS — Z1211 Encounter for screening for malignant neoplasm of colon: Secondary | ICD-10-CM | POA: Diagnosis not present

## 2019-11-30 DIAGNOSIS — R1013 Epigastric pain: Secondary | ICD-10-CM | POA: Diagnosis not present

## 2019-11-30 DIAGNOSIS — K2970 Gastritis, unspecified, without bleeding: Secondary | ICD-10-CM | POA: Diagnosis not present

## 2019-11-30 DIAGNOSIS — E119 Type 2 diabetes mellitus without complications: Secondary | ICD-10-CM | POA: Diagnosis not present

## 2019-11-30 DIAGNOSIS — K317 Polyp of stomach and duodenum: Secondary | ICD-10-CM | POA: Diagnosis not present

## 2019-11-30 DIAGNOSIS — K2270 Barrett's esophagus without dysplasia: Secondary | ICD-10-CM | POA: Diagnosis not present

## 2019-11-30 DIAGNOSIS — A048 Other specified bacterial intestinal infections: Secondary | ICD-10-CM | POA: Diagnosis not present

## 2019-11-30 DIAGNOSIS — Z01818 Encounter for other preprocedural examination: Secondary | ICD-10-CM | POA: Diagnosis not present

## 2019-11-30 DIAGNOSIS — K900 Celiac disease: Secondary | ICD-10-CM | POA: Diagnosis not present

## 2019-12-05 ENCOUNTER — Other Ambulatory Visit: Payer: Self-pay | Admitting: Internal Medicine

## 2019-12-07 ENCOUNTER — Telehealth: Payer: Self-pay

## 2019-12-07 NOTE — Telephone Encounter (Signed)
Returned pts call, LVM, needing clarification on what refill she needs.

## 2019-12-07 NOTE — Telephone Encounter (Signed)
-----   Message from Carlynn Purl sent at 12/07/2019  9:25 AM EDT ----- Please call patient about refill on medication . Here phone number is [phone].  Thanks

## 2019-12-14 DIAGNOSIS — J3089 Other allergic rhinitis: Secondary | ICD-10-CM | POA: Diagnosis not present

## 2019-12-14 DIAGNOSIS — Z79891 Long term (current) use of opiate analgesic: Secondary | ICD-10-CM | POA: Diagnosis not present

## 2019-12-14 DIAGNOSIS — G8929 Other chronic pain: Secondary | ICD-10-CM | POA: Diagnosis not present

## 2019-12-14 DIAGNOSIS — J0110 Acute frontal sinusitis, unspecified: Secondary | ICD-10-CM | POA: Diagnosis not present

## 2019-12-14 DIAGNOSIS — J45991 Cough variant asthma: Secondary | ICD-10-CM | POA: Diagnosis not present

## 2019-12-14 DIAGNOSIS — G894 Chronic pain syndrome: Secondary | ICD-10-CM | POA: Diagnosis not present

## 2019-12-14 DIAGNOSIS — G43909 Migraine, unspecified, not intractable, without status migrainosus: Secondary | ICD-10-CM | POA: Diagnosis not present

## 2019-12-14 DIAGNOSIS — J0111 Acute recurrent frontal sinusitis: Secondary | ICD-10-CM | POA: Diagnosis not present

## 2019-12-22 ENCOUNTER — Ambulatory Visit: Payer: Medicare Other | Admitting: Endocrinology

## 2019-12-25 ENCOUNTER — Other Ambulatory Visit: Payer: Self-pay | Admitting: Internal Medicine

## 2020-01-02 ENCOUNTER — Telehealth: Payer: Self-pay

## 2020-01-02 NOTE — Telephone Encounter (Signed)
New message     Patient voiced insurance will paid for Omni-podd will need an prescription from Dr. Loanne Drilling .   Phone # [phone]- attention Sam.

## 2020-01-04 ENCOUNTER — Other Ambulatory Visit: Payer: Self-pay | Admitting: Internal Medicine

## 2020-01-04 NOTE — Telephone Encounter (Signed)
We'll need to address this at next ov.  Please move up appt to next available

## 2020-01-05 NOTE — Telephone Encounter (Signed)
Left message informing patient omnipod rx cannot be done until she is seen in the office.  Told her to call and reschedule appointment if she wants to come in sooner.

## 2020-01-11 DIAGNOSIS — G43909 Migraine, unspecified, not intractable, without status migrainosus: Secondary | ICD-10-CM | POA: Diagnosis not present

## 2020-01-11 DIAGNOSIS — J0111 Acute recurrent frontal sinusitis: Secondary | ICD-10-CM | POA: Diagnosis not present

## 2020-01-11 DIAGNOSIS — G894 Chronic pain syndrome: Secondary | ICD-10-CM | POA: Diagnosis not present

## 2020-01-11 DIAGNOSIS — G8929 Other chronic pain: Secondary | ICD-10-CM | POA: Diagnosis not present

## 2020-01-11 DIAGNOSIS — Z79891 Long term (current) use of opiate analgesic: Secondary | ICD-10-CM | POA: Diagnosis not present

## 2020-01-11 DIAGNOSIS — J3089 Other allergic rhinitis: Secondary | ICD-10-CM | POA: Diagnosis not present

## 2020-01-11 DIAGNOSIS — M545 Low back pain: Secondary | ICD-10-CM | POA: Diagnosis not present

## 2020-01-11 DIAGNOSIS — J45991 Cough variant asthma: Secondary | ICD-10-CM | POA: Diagnosis not present

## 2020-01-12 ENCOUNTER — Other Ambulatory Visit: Payer: Self-pay | Admitting: Internal Medicine

## 2020-01-12 DIAGNOSIS — H04123 Dry eye syndrome of bilateral lacrimal glands: Secondary | ICD-10-CM | POA: Diagnosis not present

## 2020-01-16 DIAGNOSIS — R1013 Epigastric pain: Secondary | ICD-10-CM | POA: Diagnosis not present

## 2020-01-17 ENCOUNTER — Other Ambulatory Visit: Payer: Self-pay

## 2020-01-17 ENCOUNTER — Telehealth: Payer: Self-pay | Admitting: Internal Medicine

## 2020-01-17 MED ORDER — PEN NEEDLES 32G X 5 MM MISC: 1 refills | Status: DC

## 2020-01-17 NOTE — Telephone Encounter (Signed)
Insulin Pen Needle (PEN NEEDLES) 32G X 5 MM MISC  [id number] - [place], [address] AT [address] Phone:  [phone]  Fax:  [phone]

## 2020-01-17 NOTE — Telephone Encounter (Signed)
Faxed in today. 

## 2020-01-18 DIAGNOSIS — E119 Type 2 diabetes mellitus without complications: Secondary | ICD-10-CM | POA: Diagnosis not present

## 2020-01-24 ENCOUNTER — Other Ambulatory Visit: Payer: Self-pay | Admitting: Internal Medicine

## 2020-01-25 ENCOUNTER — Other Ambulatory Visit: Payer: Self-pay

## 2020-01-25 ENCOUNTER — Encounter: Payer: Self-pay | Admitting: Endocrinology

## 2020-01-25 ENCOUNTER — Ambulatory Visit (INDEPENDENT_AMBULATORY_CARE_PROVIDER_SITE_OTHER): Payer: Medicare Other | Admitting: Endocrinology

## 2020-01-25 VITALS — BP 106/70 | HR 58 | Ht 65.0 in | Wt 294.0 lb

## 2020-01-25 DIAGNOSIS — Z794 Long term (current) use of insulin: Secondary | ICD-10-CM | POA: Diagnosis not present

## 2020-01-25 DIAGNOSIS — E1142 Type 2 diabetes mellitus with diabetic polyneuropathy: Secondary | ICD-10-CM | POA: Diagnosis not present

## 2020-01-25 LAB — POCT GLYCOSYLATED HEMOGLOBIN (HGB A1C): Hemoglobin A1C: 12.8 % — AB (ref 4.0–5.6)

## 2020-01-25 MED ORDER — TOUJEO MAX SOLOSTAR 300 UNIT/ML ~~LOC~~ SOPN
140.0000 [IU] | PEN_INJECTOR | SUBCUTANEOUS | 3 refills | Status: DC
Start: 2020-01-25 — End: 2020-05-31

## 2020-01-25 NOTE — Patient Instructions (Addendum)
check your blood sugar twice a day.  vary the time of day when you check, between before the 3 meals, and at bedtime.  also check if you have symptoms of your blood sugar being too high or too low.  please keep a record of the readings and bring it to your next appointment here (or you can bring the meter itself).  You can write it on any piece of paper.  please call us sooner if your blood sugar goes below 70, or if you have a lot of readings over 200. I have sent a prescription to your pharmacy, to increase the insulin to 140 units each morning.   Please come back for a follow-up appointment in 6 weeks.

## 2020-01-25 NOTE — Progress Notes (Signed)
Subjective:    Patient ID: Tammy Strickland, female    DOB: [birth year], 59 y.o.   MRN: [id number]  HPI Pt returns for f/u of diabetes mellitus: DM type: Insulin-requiring type 2 Dx'ed: 1497 Complications: PN Therapy: insulin since 2017 GDM: never DKA: never Severe hypoglycemia: never Pancreatitis: once, in 2021, while in Ozempic Pancreatic imaging: normal on 2021 CT SDOH: none Other: she takes QD insulin, at least for now Interval history: Pt says she never misses the insulin.  she brings her meter with her cbg's which I have reviewed today. cbg varies from 195-448.  pt states she feels well in general. Past Medical History:  Diagnosis Date  . Anxiety   . Arthritis   . Asthma   . CHF (congestive heart failure) ([place])   . COPD (chronic obstructive pulmonary disease) ([place])   . Coronary artery disease   . Depression   . Diabetes mellitus without complication ([place])    type 2   . Fibromyalgia   . Gallstones   . GERD (gastroesophageal reflux disease)   . HLD (hyperlipidemia)   . Hypertension   . Lupus ([place])   . Migraines   . Neuromuscular disorder ([place])   . Osteoporosis   . Oxygen deficiency    pt uses 2.5L 02 at night,(08/25/2019)pt states she no longer uses oxygen, hasn't used in 7-8 years  . Pancreatitis   . Peripheral neuropathy   . Shingles   . Sleep apnea    had sleep study done recently ; unaware if she will be getting  a CPAP device ; patient states "im pretty sure i have it , i fall alseep all the time "  . Stroke [facility]) 10/2015    Past Surgical History:  Procedure Laterality Date  . ABDOMINAL HYSTERECTOMY     ovaries left  . APPENDECTOMY    . BREAST LUMPECTOMY WITH RADIOACTIVE SEED LOCALIZATION Left 06/14/2019   Procedure: LEFT BREAST LUMPECTOMY WITH RADIOACTIVE SEED LOCALIZATION X 2;  Surgeon: Coralie Keens, MD;  Location: [facility];  Service: General;  Laterality: Left;  LMA  . CHOLECYSTECTOMY    . IR GENERIC HISTORICAL  11/07/2015   IR ANGIO INTRA EXTRACRAN  SEL COM CAROTID INNOMINATE BILAT MOD SED 11/07/2015 Luanne Bras, MD [facility]  . IR GENERIC HISTORICAL  11/07/2015   IR ANGIO VERTEBRAL SEL SUBCLAVIAN INNOMINATE UNI R MOD SED 11/07/2015 Luanne Bras, MD [facility]  . IR GENERIC HISTORICAL  11/07/2015   IR ANGIO VERTEBRAL SEL VERTEBRAL UNI L MOD SED 11/07/2015 Luanne Bras, MD [facility]  . IR GENERIC HISTORICAL  11/07/2015   IR ANGIOGRAM EXTREMITY LEFT 11/07/2015 Luanne Bras, MD [facility]  . TONSILLECTOMY    . TOTAL KNEE ARTHROPLASTY Left 03/12/2017   Procedure: LEFT TOTAL KNEE ARTHROPLASTY;  Surgeon: Mcarthur Rossetti, MD;  Location[facility];  Service: Orthopedics;  Laterality: Left;  Adductor Block    Social History   Socioeconomic History  . Marital status: Single    Spouse name: n/a  . Number of children: 3  . Years of education: 12+  . Highest education level: Not on file  Occupational History  . 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 Smoking status: Current Every Day Smoker    Packs/day: 0.25    Years: 35.00    Pack years: 8.75    Types: Cigarettes  . Smokeless tobacco: Never Used  . Tobacco comment: referred  to smoking  cessation  classes. at  [facility] Use  . Vaping Use: Never used  Substance and Sexual Activity  . Alcohol use: Yes    Comment: occasional  . Drug use: No  . Sexual activity: Not Currently    Partners: Female    Birth control/protection: Surgical    Comment: hysterectomy  Other Topics Concern  . Not on file  Social History Narrative   Moved to [place] from [place], [place] February 2017, to help her daughter.   Lives with her daughter.   Sons live in [place] and [place].   She reports that there were originally 17 children in her family (she is the youngest), the oldest are deceased, some prior to her birth, and she isn't sure which were female/female or how they  died.   Social Determinants of Health   Financial Resource Strain:   . Difficulty of Paying Living Expenses: Not on file  Food Insecurity:   . Worried About Charity fundraiser in the Last Year: Not on file  . Ran Out of Food in the Last Year: Not on file  Transportation Needs:   . Lack of Transportation (Medical): Not on file  . Lack of Transportation (Non-Medical): Not on file  Physical Activity:   . Days of Exercise per Week: Not on file  . Minutes of Exercise per Session: Not on file  Stress:   . Feeling of Stress : Not on file  Social Connections:   . Frequency of Communication with Friends and Family: Not on file  . Frequency of Social Gatherings with Friends and Family: Not on file  . Attends Religious Services: Not on file  . Active Member of Clubs or Organizations: Not on file  . Attends Archivist Meetings: Not on file  . Marital Status: Not on file  Intimate Partner Violence:   . Fear of Current or Ex-Partner: Not on file  . Emotionally Abused: Not on file  . Physically Abused: Not on file  . Sexually Abused: Not on file    Current Outpatient Medications on File Prior to Visit  Medication Sig Dispense Refill  . ACCU-CHEK GUIDE test strip TEST FOUR TIMES DAILY (Patient taking differently: 1 each by Other route 4 (four) times daily. E11.65) 100 strip 2  . Accu-Chek Softclix Lancets lancets 1 each by Other route 4 (four) times daily. E11.65    . amitriptyline (ELAVIL) 75 MG tablet Take 1 tablet by mouth at bedtime 30 tablet 5  . aspirin EC 81 MG tablet Take 1 tablet (81 mg total) by mouth daily.    Marland Kitchen atorvastatin (LIPITOR) 20 MG tablet Take 1 tablet by mouth every day 30 tablet 11  . Blood Glucose Monitoring Suppl (ACCU-CHEK GUIDE) w/Device KIT 1 each by Does not apply route 4 (four) times daily. E11.65    . budesonide-formoterol (SYMBICORT) 80-4.5 MCG/ACT inhaler Inhale 2 puffs into the lungs as needed (wheezing/SOB). 10.2 g 11  . cloNIDine (CATAPRES) 0.3 MG  tablet Take 1 tablet by mouth every day 30 tablet 5  . fluticasone (FLONASE) 50 MCG/ACT nasal spray Shake liquid & use 1 spray into each nostril every day as needed for allergies 48 g 0  . furosemide (LASIX) 80 MG tablet Take 1 tablet by mouth every day 30 tablet 5  . gabapentin (NEURONTIN) 600 MG tablet Take 2 tablets (1,200 mg total) by mouth 3 (three) times daily. Follow-up appt due in Oct must see provider for future refills 180 tablet 0  .  glipiZIDE (GLUCOTROL) 5 MG tablet Take 1 tablet by mouth twice daily 60 tablet 11  . Insulin Pen Needle (PEN NEEDLES) 32G X 5 MM MISC UAD daily for insulin 90 each 1  . losartan (COZAAR) 25 MG tablet Take 1 tablet (25 mg total) by mouth daily. 90 tablet 1  . losartan (COZAAR) 50 MG tablet Take 1 tablet by mouth twice daily 60 tablet 5  . metoprolol (TOPROL-XL) 200 MG 24 hr tablet Take 1 tablet by mouth twice daily 60 tablet 5  . nitroGLYCERIN (NITROSTAT) 0.4 MG SL tablet Place 0.4 mg under the tongue every 5 (five) minutes as needed for chest pain.    Marland Kitchen omeprazole (PRILOSEC) 40 MG capsule Take 1 capsule (40 mg total) by mouth daily. Follow-up appt due in Oct must see provider for future refills 90 capsule 0  . oxyCODONE-acetaminophen (PERCOCET) 10-325 MG tablet Take 1 tablet by mouth every 4 (four) hours as needed.    Marland Kitchen OZEMPIC, 1 MG/DOSE, 2 MG/1.5ML SOPN Inject 1 mg subcutaneously once a week. 3 mL 11  . pantoprazole (PROTONIX) 40 MG tablet Take one tablet by mouth twice daily (am and pm) 60 tablet 11  . potassium chloride SA (KLOR-CON) 20 MEQ tablet Take 1 tablet by mouth every day 30 tablet 5  . tiZANidine (ZANAFLEX) 4 MG tablet Take 1 tablet by mouth at bedtime 30 tablet 11   No current facility-administered medications on file prior to visit.    Allergies  Allergen Reactions  . Metformin And Related Other (See Comments)    Lactic acidosis  . Other Other (See Comments)    H/o pancreatitis  . Ozempic (0.25 Or 0.5 Mg-Dose) [Semaglutide(0.25 Or  0.70m-Dos)]     H/o pancreatitis  . Bacitracin Hives    Family History  Problem Relation Age of Onset  . Hyperlipidemia Mother   . Hypertension Mother   . Stroke Mother   . Thyroid disease Mother   . Heart attack Mother   . Hyperlipidemia Father   . Hypertension Father   . Stroke Father   . Heart attack Father   . Hypertension Sister   . Stroke Sister   . Thyroid disease Sister   . Breast cancer Sister   . Crohn's disease Sister   . Hypertension Sister   . Hypertension Brother   . Diabetes Brother   . Hypertension Brother     BP 106/70   Pulse (!) 58   Ht '5\' 5"'  (1.651 m)   Wt 294 lb (133.4 kg)   SpO2 93%   BMI 48.92 kg/m    Review of Systems She denies hypoglycemia.      Objective:   Physical Exam VITAL SIGNS:  See vs page GENERAL: no distress Pulses: dorsalis pedis intact bilat.   MSK: no deformity of the feet CV: trace bilat leg edema Skin:  no ulcer on the feet.  normal color and temp on the feet. Neuro: sensation is intact to touch on the feet Ext: there is bilateral onychomycosis of the toenails.  Both great toenails are absent.    Lab Results  Component Value Date   HGBA1C 12.8 (A) 01/25/2020       Assessment & Plan:  Insulin-requiring type 2 DM: uncontrolled  Patient Instructions  check your blood sugar twice a day.  vary the time of day when you check, between before the 3 meals, and at bedtime.  also check if you have symptoms of your blood sugar being too high or too low.  please keep a record of the readings and bring it to your next appointment here (or you can bring the meter itself).  You can write it on any piece of paper.  please call us sooner if your blood sugar goes below 70, or if you have a lot of readings over 200. I have sent a prescription to your pharmacy, to increase the insulin to 140 units each morning.   Please come back for a follow-up appointment in 6 weeks.

## 2020-01-26 ENCOUNTER — Telehealth: Payer: Self-pay | Admitting: Endocrinology

## 2020-01-26 NOTE — Telephone Encounter (Signed)
Daneil Dan from pharmacy called asking about clarification on dose for patient's Toujeo - she said it says for patient to take 140 units in the morning and she thought that was a little higher than usual so just wanted to be sure that was correct. Also, patient used to take Lantus 35 units (prescribed by Dr Quay Burow) - pharmacy just wanted to make sure patient was needing to switch from Lantus to [facility].  Ref# [id number]

## 2020-01-26 NOTE — Telephone Encounter (Signed)
Yes, please increase to 140 units qam.  Either Lantus or Toujeo is fine with me, but toujeo is easier.

## 2020-01-30 ENCOUNTER — Encounter (HOSPITAL_COMMUNITY): Payer: Self-pay

## 2020-02-08 ENCOUNTER — Telehealth: Payer: Self-pay | Admitting: Internal Medicine

## 2020-02-08 DIAGNOSIS — J0111 Acute recurrent frontal sinusitis: Secondary | ICD-10-CM | POA: Diagnosis not present

## 2020-02-08 DIAGNOSIS — J45991 Cough variant asthma: Secondary | ICD-10-CM | POA: Diagnosis not present

## 2020-02-08 DIAGNOSIS — G894 Chronic pain syndrome: Secondary | ICD-10-CM | POA: Diagnosis not present

## 2020-02-08 DIAGNOSIS — M545 Low back pain, unspecified: Secondary | ICD-10-CM | POA: Diagnosis not present

## 2020-02-08 DIAGNOSIS — G43909 Migraine, unspecified, not intractable, without status migrainosus: Secondary | ICD-10-CM | POA: Diagnosis not present

## 2020-02-08 DIAGNOSIS — G8929 Other chronic pain: Secondary | ICD-10-CM | POA: Diagnosis not present

## 2020-02-08 DIAGNOSIS — Z79891 Long term (current) use of opiate analgesic: Secondary | ICD-10-CM | POA: Diagnosis not present

## 2020-02-08 DIAGNOSIS — J3089 Other allergic rhinitis: Secondary | ICD-10-CM | POA: Diagnosis not present

## 2020-02-08 NOTE — Telephone Encounter (Signed)
   Patient would like office to contact [facility]  to advise if gabapentin (NEURONTIN) 600 MG tablet needs to be continued.  Provider is considering taking patient off medication and adding Meloxicam Patient requesting Dr Sharol Roussel be called at [phone]

## 2020-02-08 NOTE — Telephone Encounter (Signed)
Ok to try stopping gabapentin

## 2020-02-08 NOTE — Telephone Encounter (Signed)
Called and left message with info today on answering machine for [facility].

## 2020-03-04 ENCOUNTER — Other Ambulatory Visit: Payer: Self-pay | Admitting: Internal Medicine

## 2020-03-04 NOTE — Progress Notes (Signed)
59 y.o. [id number] Single Black or African American Not Hispanic or Latino female here for annual exam. H/O hysterectomy. Not sexually active.   She has lower abdomen skin irritation. She says that her hands also peel, just the first 3 fingers on both hands.  She has big bumps on her pannus, sores.     Multiple medical issues, including: diabetes, HTN, osteoporosis, fibromyalgia and TIA. Followed by Dr Quay Burow and Endocrinology. Last HgbA1C was 12.8 in 10/21.   No LMP recorded. Patient has had a hysterectomy.          Sexually active: No.  The current method of family planning is post menopausal status.    Exercising: Yes.    walking riding bike Smoker:  Yes, 1/2 PPD  Health Maintenance: Pap:  1989  History of abnormal Pap:  no MMG:  05/02/19 Bi-rads 4 suspicious Pathology 1/26/21COMPLEX SCLEROSING LESION WITH CALCIFICATIONS of the LEFT breast, inferior posterior  05/18/19 Pathology: left breast FIBROADENOMATOID NODULE WITH CALCIFICATIONS of the Left breast, upper inner quadrant 06/14/19 she had a lumpectomy with Dr Ninfa Linden, benign.  BMD:   None  Colonoscopy: 2018 normal, f/u 10 years.   TDaP:  2019  Gardasil: none    reports that she has been smoking cigarettes. She has a 8.75 pack-year smoking history. She has never used smokeless tobacco. She reports current alcohol use. She reports that she does not use drugs. 2 drinks a week. She is on disability. She lives by herself. She has 3 kids, has 12 grand children. Estranged from her oldest child (son), doesn't know if he has any kids  Past Medical History:  Diagnosis Date  . Anxiety   . Arthritis   . Asthma   . CHF (congestive heart failure) ([place])   . COPD (chronic obstructive pulmonary disease) ([place])   . Coronary artery disease   . Depression   . Diabetes mellitus without complication ([place])    type 2   . Fibromyalgia   . Gallstones   . GERD (gastroesophageal reflux disease)   . HLD (hyperlipidemia)   . Hypertension   . Lupus ([place])    . Migraines   . Neuromuscular disorder ([place])   . Osteoporosis   . Oxygen deficiency    pt uses 2.5L 02 at night,(08/25/2019)pt states she no longer uses oxygen, hasn't used in 7-8 years  . Pancreatitis   . Peripheral neuropathy   . Shingles   . Sleep apnea    had sleep study done recently ; unaware if she will be getting  a CPAP device ; patient states "im pretty sure i have it , i fall alseep all the time "  . Stroke [facility]) 10/2015    Past Surgical History:  Procedure Laterality Date  . ABDOMINAL HYSTERECTOMY     ovaries left  . APPENDECTOMY    . BREAST LUMPECTOMY WITH RADIOACTIVE SEED LOCALIZATION Left 06/14/2019   Procedure: LEFT BREAST LUMPECTOMY WITH RADIOACTIVE SEED LOCALIZATION X 2;  Surgeon: Coralie Keens, MD;  Location: [facility];  Service: General;  Laterality: Left;  LMA  . CHOLECYSTECTOMY    . IR GENERIC HISTORICAL  11/07/2015   IR ANGIO INTRA EXTRACRAN SEL COM CAROTID INNOMINATE BILAT MOD SED 11/07/2015 Luanne Bras, MD [facility]  . IR GENERIC HISTORICAL  11/07/2015   IR ANGIO VERTEBRAL SEL SUBCLAVIAN INNOMINATE UNI R MOD SED 11/07/2015 Luanne Bras, MD [facility]  . IR GENERIC HISTORICAL  11/07/2015   IR ANGIO VERTEBRAL SEL VERTEBRAL UNI L MOD SED 11/07/2015 Luanne Bras,  MD [facility]  . IR GENERIC HISTORICAL  11/07/2015   IR ANGIOGRAM EXTREMITY LEFT 11/07/2015 Luanne Bras, MD [facility]  . TONSILLECTOMY    . TOTAL KNEE ARTHROPLASTY Left 03/12/2017   Procedure: LEFT TOTAL KNEE ARTHROPLASTY;  Surgeon: Mcarthur Rossetti, MD;  [facility];  Service: Orthopedics;  Laterality: Left;  Adductor Block    Current Outpatient Medications  Medication Sig Dispense Refill  . ACCU-CHEK GUIDE test strip TEST FOUR TIMES DAILY (Patient taking differently: 1 each by Other route 4 (four) times daily. E11.65) 100 strip 2  . Accu-Chek Softclix Lancets lancets 1 each by Other route 4 (four) times daily. E11.65    . amitriptyline (ELAVIL) 75 MG tablet  Take 1 tablet by mouth at bedtime 30 tablet 5  . aspirin EC 81 MG tablet Take 1 tablet (81 mg total) by mouth daily.    Marland Kitchen atorvastatin (LIPITOR) 20 MG tablet Take 1 tablet by mouth every day 30 tablet 11  . Blood Glucose Monitoring Suppl (ACCU-CHEK GUIDE) w/Device KIT 1 each by Does not apply route 4 (four) times daily. E11.65    . budesonide-formoterol (SYMBICORT) 80-4.5 MCG/ACT inhaler Inhale 2 puffs into the lungs as needed (wheezing/SOB). 10.2 g 11  . cloNIDine (CATAPRES) 0.3 MG tablet Take 1 tablet by mouth every day 30 tablet 5  . fluticasone (FLONASE) 50 MCG/ACT nasal spray Shake liquid & use 1 spray into each nostril every day as needed for allergies 48 g 0  . furosemide (LASIX) 80 MG tablet Take 1 tablet by mouth every day 30 tablet 5  . gabapentin (NEURONTIN) 600 MG tablet Take 2 tablets by mouth 3 times a day, need appt 180 tablet 2  . insulin glargine, 2 Unit Dial, (TOUJEO MAX SOLOSTAR) 300 UNIT/ML Solostar Pen Inject 140 Units into the skin every morning. And pen needles 1/day 45 mL 3  . Insulin Pen Needle (PEN NEEDLES) 32G X 5 MM MISC UAD daily for insulin 90 each 1  . losartan (COZAAR) 50 MG tablet Take 1 tablet by mouth twice daily 60 tablet 5  . metoprolol (TOPROL-XL) 200 MG 24 hr tablet Take 1 tablet by mouth twice daily 60 tablet 5  . nitroGLYCERIN (NITROSTAT) 0.4 MG SL tablet Place 0.4 mg under the tongue every 5 (five) minutes as needed for chest pain.    . pantoprazole (PROTONIX) 40 MG tablet Take one tablet by mouth twice daily (am and pm) 60 tablet 11  . potassium chloride SA (KLOR-CON) 20 MEQ tablet Take 1 tablet by mouth every day 30 tablet 5  . tiZANidine (ZANAFLEX) 4 MG tablet Take 1 tablet by mouth at bedtime 30 tablet 11  . meloxicam (MOBIC) 15 MG tablet Take 15 mg by mouth daily.    Marland Kitchen NARCAN 4 MG/0.1ML LIQD nasal spray kit SMARTSIG:1 Spray(s) Both Nares Once PRN    . oxyCODONE-acetaminophen (PERCOCET) 10-325 MG tablet Take 1 tablet by mouth every 4 (four) hours as  needed.     No current facility-administered medications for this visit.    Family History  Problem Relation Age of Onset  . Hyperlipidemia Mother   . Hypertension Mother   . Stroke Mother   . Thyroid disease Mother   . Heart attack Mother   . Hyperlipidemia Father   . Hypertension Father   . Stroke Father   . Heart attack Father   . Hypertension Sister   . Stroke Sister   . Thyroid disease Sister   . Breast cancer Sister   .  Crohn's disease Sister   . Hypertension Sister   . Hypertension Brother   . Diabetes Brother   . Hypertension Brother     Review of Systems  All other systems reviewed and are negative.   Exam:   BP 122/64   Pulse 88   Ht '5\' 5"'  (1.651 m)   Wt (!) 302 lb (137 kg)   SpO2 98%   BMI 50.26 kg/m   Weight change: '@WEIGHTCHANGE' @ Height:   Height: '5\' 5"'  (165.1 cm)  Ht Readings from Last 3 Encounters:  03/06/20 '5\' 5"'  (1.651 m)  01/25/20 '5\' 5"'  (1.651 m)  11/09/19 '5\' 5"'  (1.651 m)    General appearance: alert, cooperative and appears stated age Head: Normocephalic, without obvious abnormality, atraumatic Neck: no adenopathy, supple, symmetrical, trachea midline and thyroid normal to inspection and palpation Lungs: clear to auscultation bilaterally Cardiovascular: regular rate and rhythm Breasts: normal appearance, no masses or tenderness Abdomen: soft, non-tender; non distended,  no masses,  no organomegaly Extremities: extremities normal, atraumatic, no cyanosis or edema Skin: Skin color, texture, turgor normal. No rashes or lesions Lymph nodes: Cervical, supraclavicular, and axillary nodes normal. No abnormal inguinal nodes palpated Neurologic: Grossly normal   Pelvic: External genitalia:  no lesions              Urethra:  normal appearing urethra with no masses, tenderness or lesions              Bartholins and Skenes: normal                 Vagina: normal appearing vagina with normal color and discharge, no lesions              Cervix:  absent               Bimanual Exam:  Uterus:  uterus absent              Adnexa: no mass, fullness, tenderness               Rectovaginal: Confirms               Anus:  normal sphincter tone, no lesions  Gae Dry chaperoned for the exam.  A:  Well Woman with normal exam  Boil on lower abdomen, small  C/O frequent flatus for years. No change in her diet, negative colonoscopy  Poorly controlled DM, urged her to f/u with Endocrinology  P:   No pap needed  Mammogram in 1/22  Colonoscopy UTD  Labs with primary  Warm compresses and hot soaks to the boil, information given  Discussed increased gas, can be diet related. Can try Gas-X or Beano

## 2020-03-06 ENCOUNTER — Ambulatory Visit (INDEPENDENT_AMBULATORY_CARE_PROVIDER_SITE_OTHER): Payer: Medicare Other | Admitting: Obstetrics and Gynecology

## 2020-03-06 ENCOUNTER — Other Ambulatory Visit: Payer: Self-pay

## 2020-03-06 ENCOUNTER — Encounter: Payer: Self-pay | Admitting: Obstetrics and Gynecology

## 2020-03-06 VITALS — BP 122/64 | HR 88 | Ht 65.0 in | Wt 302.0 lb

## 2020-03-06 DIAGNOSIS — R143 Flatulence: Secondary | ICD-10-CM

## 2020-03-06 DIAGNOSIS — L0292 Furuncle, unspecified: Secondary | ICD-10-CM | POA: Diagnosis not present

## 2020-03-06 DIAGNOSIS — Z01419 Encounter for gynecological examination (general) (routine) without abnormal findings: Secondary | ICD-10-CM

## 2020-03-06 NOTE — Patient Instructions (Addendum)
You can try Gas-X or Beano for excessive gas  Abdominal Bloating When you have abdominal bloating, your abdomen may feel full, tight, or painful. It may also look bigger than normal or swollen (distended). Common causes of abdominal bloating include:  Swallowing air.  Constipation.  Problems digesting food.  Eating too much.  Irritable bowel syndrome. This is a condition that affects the large intestine.  Lactose intolerance. This is an inability to digest lactose, a natural sugar in dairy products.  Celiac disease. This is a condition that affects the ability to digest gluten, a protein found in some grains.  Gastroparesis. This is a condition that slows down the movement of food in the stomach and small intestine. It is more common in people with diabetes mellitus.  Gastroesophageal reflux disease (GERD). This is a digestive condition that makes stomach acid flow back into the esophagus.  Urinary retention. This means that the body is holding onto urine, and the bladder cannot be emptied all the way. Follow these instructions at home: Eating and drinking  Avoid eating too much.  Try not to swallow air while talking or eating.  Avoid eating while lying down.  Avoid these foods and drinks: ? Foods that cause gas, such as broccoli, cabbage, cauliflower, and baked beans. ? Carbonated drinks. ? Hard candy. ? Chewing gum. Medicines  Take over-the-counter and prescription medicines only as told by your health care provider.  Take probiotic medicines. These medicines contain live bacteria or yeasts that can help digestion.  Take coated peppermint oil capsules. Activity  Try to exercise regularly. Exercise may help to relieve bloating that is caused by gas and relieve constipation. General instructions  Keep all follow-up visits as told by your health care provider. This is important. Contact a health care provider if:  You have nausea and vomiting.  You have  diarrhea.  You have abdominal pain.  You have unusual weight loss or weight gain.  You have severe pain, and medicines do not help. Get help right away if:  You have severe chest pain.  You have trouble breathing.  You have shortness of breath.  You have trouble urinating.  You have darker urine than normal.  You have blood in your stools or have dark, tarry stools. Summary  Abdominal bloating means that the abdomen is swollen.  Common causes of abdominal bloating are swallowing air, constipation, and problems digesting food.  Avoid eating too much and avoid swallowing air.  Avoid foods that cause gas, carbonated drinks, hard candy, and chewing gum. This information is not intended to replace advice given to you by your health care provider. Make sure you discuss any questions you have with your health care provider. Document Revised: 07/18/2018 Document Reviewed: 05/01/2016 Elsevier Patient Education  [place].   Skin Abscess  A skin abscess is an infected area on or under your skin that contains a collection of pus and other material. An abscess may also be called a furuncle, carbuncle, or boil. An abscess can occur in or on almost any part of your body. Some abscesses break open (rupture) on their own. Most continue to get worse unless they are treated. The infection can spread deeper into the body and eventually into your blood, which can make you feel ill. Treatment usually involves draining the abscess. What are the causes? An abscess occurs when germs, like bacteria, pass through your skin and cause an infection. This may be caused by:  A scrape or cut on your skin.  A puncture wound through your skin, including a needle injection or insect bite.  Blocked oil or sweat glands.  Blocked and infected hair follicles.  A cyst that forms beneath your skin (sebaceous cyst) and becomes infected. What increases the risk? This condition is more likely to  develop in people who:  Have a weak body defense system (immune system).  Have diabetes.  Have dry and irritated skin.  Get frequent injections or use illegal IV drugs.  Have a foreign body in a wound, such as a splinter.  Have problems with their lymph system or veins. What are the signs or symptoms? Symptoms of this condition include:  A painful, firm bump under the skin.  A bump with pus at the top. This may break through the skin and drain. Other symptoms include:  Redness surrounding the abscess site.  Warmth.  Swelling of the lymph nodes (glands) near the abscess.  Tenderness.  A sore on the skin. How is this diagnosed? This condition may be diagnosed based on:  A physical exam.  Your medical history.  A sample of pus. This may be used to find out what is causing the infection.  Blood tests.  Imaging tests, such as an ultrasound, CT scan, or MRI. How is this treated? A small abscess that drains on its own may not need treatment. Treatment for larger abscesses may include:  Moist heat or heat pack applied to the area several times a day.  A procedure to drain the abscess (incision and drainage).  Antibiotic medicines. For a severe abscess, you may first get antibiotics through an IV and then change to antibiotics by mouth. Follow these instructions at home: Medicines   Take over-the-counter and prescription medicines only as told by your health care provider.  If you were prescribed an antibiotic medicine, take it as told by your health care provider. Do not stop taking the antibiotic even if you start to feel better. Abscess care   If you have an abscess that has not drained, apply heat to the affected area. Use the heat source that your health care provider recommends, such as a moist heat pack or a heating pad. ? Place a towel between your skin and the heat source. ? Leave the heat on for 20-30 minutes. ? Remove the heat if your skin turns bright  red. This is especially important if you are unable to feel pain, heat, or cold. You may have a greater risk of getting burned.  Follow instructions from your health care provider about how to take care of your abscess. Make sure you: ? Cover the abscess with a bandage (dressing). ? Change your dressing or gauze as told by your health care provider. ? Wash your hands with soap and water before you change the dressing or gauze. If soap and water are not available, use hand sanitizer.  Check your abscess every day for signs of a worsening infection. Check for: ? More redness, swelling, or pain. ? More fluid or blood. ? Warmth. ? More pus or a bad smell. General instructions  To avoid spreading the infection: ? Do not share personal care items, towels, or hot tubs with others. ? Avoid making skin contact with other people.  Keep all follow-up visits as told by your health care provider. This is important. Contact a health care provider if you have:  More redness, swelling, or pain around your abscess.  More fluid or blood coming from your abscess.  Warm skin around your  abscess.  More pus or a bad smell coming from your abscess.  A fever.  Muscle aches.  Chills or a general ill feeling. Get help right away if you:  Have severe pain.  See red streaks on your skin spreading away from the abscess. Summary  A skin abscess is an infected area on or under your skin that contains a collection of pus and other material.  A small abscess that drains on its own may not need treatment.  Treatment for larger abscesses may include having a procedure to drain the abscess and taking an antibiotic. This information is not intended to replace advice given to you by your health care provider. Make sure you discuss any questions you have with your health care provider. Document Revised: 07/21/2018 Document Reviewed: 05/13/2017 Elsevier Patient Education  [place]   We  recommended that you start or continue a regular exercise program for good health. Physical activity is anything that gets your body moving, some is better than none. The CDC recommends 150 minutes per week of Moderate-Intensity Aerobic Activity and 2 or more days of Muscle Strengthening Activity.  Benefits of exercise are limitless: helps weight loss/weight maintenance, improves mood and energy, helps with depression and anxiety, improves sleep, tones and strengthens muscles, improves balance, improves bone density, protects from chronic conditions such as heart disease, high blood pressure and diabetes and so much more. To learn more visit: WhyNotPoker.uy  DIET: Good nutrition starts with a healthy diet of fruits, vegetables, whole grains, and lean protein sources. Drink plenty of water for hydration. Minimize empty calories, sodium, sweets. For more information about dietary recommendations visit: GeekRegister.com.ee and http://schaefer-mitchell.com/  ALCOHOL:  Women should limit their alcohol intake to no more than 7 drinks/beers/glasses of wine (combined, not each!) per week. Moderation of alcohol intake to this level decreases your risk of breast cancer and liver damage.  If you are concerned that you may have a problem, or your friends have told you they are concerned about your drinking, there are many resources to help. A well-known program that is free, effective, and available to all people all over the nation is Alcoholics Anonymous.  Check out this site to learn more: BlockTaxes.se   CALCIUM AND VITAMIN D:  Adequate intake of calcium and Vitamin D are recommended for bone health.  The recommendations for exact amounts of these supplements seem to change often, but generally speaking 1000-1500 mg of calcium (between diet and supplement) and 800 units of Vitamin D per day seems prudent.     PAP SMEARS:   Pap smears, to check for cervical cancer or precancers,  have traditionally been done yearly, although recent scientific advances have shown that most women can have pap smears less often.  However, every woman still should have a physical exam from her gynecologist every year. It will include a breast check, inspection of the vulva and vagina to check for abnormal growths or skin changes, a visual exam of the cervix, and then an exam to evaluate the size and shape of the uterus and ovaries.  And after 58 years of age, a rectal exam is indicated to check for rectal cancers. We will also provide age appropriate advice regarding health maintenance, like when you should have certain vaccines, screening for sexually transmitted diseases, bone density testing, colonoscopy, mammograms, etc.   MAMMOGRAMS:  All women over 72 years old should have a routine mammogram.   COLON CANCER SCREENING: Now recommend starting at age 26.  At this time colonoscopy is not covered for routine screening until 50. There are take home tests that can be done between 45-49.   COLONOSCOPY:  Colonoscopy to screen for colon cancer is recommended for all women at age 18.  We know, you hate the idea of the prep.  We agree, BUT, having colon cancer and not knowing it is worse!!  Colon cancer so often starts as a polyp that can be seen and removed at colonscopy, which can quite literally save your life!  And if your first colonoscopy is normal and you have no family history of colon cancer, most women don't have to have it again for 10 years.  Once every ten years, you can do something that may end up saving your life, right?  We will be happy to help you get it scheduled when you are ready.  Be sure to check your insurance coverage so you understand how much it will cost.  It may be covered as a preventative service at no cost, but you should check your particular policy.      Breast Self-Awareness Breast self-awareness means being  familiar with how your breasts look and feel. It involves checking your breasts regularly and reporting any changes to your health care provider. Practicing breast self-awareness is important. A change in your breasts can be a sign of a serious medical problem. Being familiar with how your breasts look and feel allows you to find any problems early, when treatment is more likely to be successful. All women should practice breast self-awareness, including women who have had breast implants. How to do a breast self-exam One way to learn what is normal for your breasts and whether your breasts are changing is to do a breast self-exam. To do a breast self-exam: Look for Changes  1. Remove all the clothing above your waist. 2. Stand in front of a mirror in a room with good lighting. 3. Put your hands on your hips. 4. Push your hands firmly downward. 5. Compare your breasts in the mirror. Look for differences between them (asymmetry), such as: ? Differences in shape. ? Differences in size. ? Puckers, dips, and bumps in one breast and not the other. 6. Look at each breast for changes in your skin, such as: ? Redness. ? Scaly areas. 7. Look for changes in your nipples, such as: ? Discharge. ? Bleeding. ? Dimpling. ? Redness. ? A change in position. Feel for Changes Carefully feel your breasts for lumps and changes. It is best to do this while lying on your back on the floor and again while sitting or standing in the shower or tub with soapy water on your skin. Feel each breast in the following way:  Place the arm on the side of the breast you are examining above your head.  Feel your breast with the other hand.  Start in the nipple area and make  inch (2 cm) overlapping circles to feel your breast. Use the pads of your three middle fingers to do this. Apply light pressure, then medium pressure, then firm pressure. The light pressure will allow you to feel the tissue closest to the skin. The  medium pressure will allow you to feel the tissue that is a little deeper. The firm pressure will allow you to feel the tissue close to the ribs.  Continue the overlapping circles, moving downward over the breast until you feel your ribs below your breast.  Move one finger-width toward the center of  the body. Continue to use the  inch (2 cm) overlapping circles to feel your breast as you move slowly up toward your collarbone.  Continue the up and down exam using all three pressures until you reach your armpit.  Write Down What You Find  Write down what is normal for each breast and any changes that you find. Keep a written record with breast changes or normal findings for each breast. By writing this information down, you do not need to depend only on memory for size, tenderness, or location. Write down where you are in your menstrual cycle, if you are still menstruating. If you are having trouble noticing differences in your breasts, do not get discouraged. With time you will become more familiar with the variations in your breasts and more comfortable with the exam. How often should I examine my breasts? Examine your breasts every month. If you are breastfeeding, the best time to examine your breasts is after a feeding or after using a breast pump. If you menstruate, the best time to examine your breasts is 5-7 days after your period is over. During your period, your breasts are lumpier, and it may be more difficult to notice changes. When should I see my health care provider? See your health care provider if you notice:  A change in shape or size of your breasts or nipples.  A change in the skin of your breast or nipples, such as a reddened or scaly area.  Unusual discharge from your nipples.  A lump or thick area that was not there before.  Pain in your breasts.  Anything that concerns you.

## 2020-03-11 DIAGNOSIS — M25562 Pain in left knee: Secondary | ICD-10-CM | POA: Diagnosis not present

## 2020-03-11 DIAGNOSIS — M25549 Pain in joints of unspecified hand: Secondary | ICD-10-CM | POA: Diagnosis not present

## 2020-03-11 DIAGNOSIS — M25552 Pain in left hip: Secondary | ICD-10-CM | POA: Diagnosis not present

## 2020-03-11 DIAGNOSIS — M25579 Pain in unspecified ankle and joints of unspecified foot: Secondary | ICD-10-CM | POA: Diagnosis not present

## 2020-03-11 DIAGNOSIS — G8929 Other chronic pain: Secondary | ICD-10-CM | POA: Diagnosis not present

## 2020-03-11 DIAGNOSIS — G894 Chronic pain syndrome: Secondary | ICD-10-CM | POA: Diagnosis not present

## 2020-03-11 DIAGNOSIS — M545 Low back pain, unspecified: Secondary | ICD-10-CM | POA: Diagnosis not present

## 2020-03-11 DIAGNOSIS — Z79891 Long term (current) use of opiate analgesic: Secondary | ICD-10-CM | POA: Diagnosis not present

## 2020-03-28 ENCOUNTER — Other Ambulatory Visit: Payer: Self-pay | Admitting: Internal Medicine

## 2020-04-08 DIAGNOSIS — G8929 Other chronic pain: Secondary | ICD-10-CM | POA: Diagnosis not present

## 2020-04-08 DIAGNOSIS — M25552 Pain in left hip: Secondary | ICD-10-CM | POA: Diagnosis not present

## 2020-04-08 DIAGNOSIS — M25549 Pain in joints of unspecified hand: Secondary | ICD-10-CM | POA: Diagnosis not present

## 2020-04-08 DIAGNOSIS — E1149 Type 2 diabetes mellitus with other diabetic neurological complication: Secondary | ICD-10-CM | POA: Diagnosis not present

## 2020-04-08 DIAGNOSIS — G894 Chronic pain syndrome: Secondary | ICD-10-CM | POA: Diagnosis not present

## 2020-04-08 DIAGNOSIS — Z79891 Long term (current) use of opiate analgesic: Secondary | ICD-10-CM | POA: Diagnosis not present

## 2020-04-08 DIAGNOSIS — M5137 Other intervertebral disc degeneration, lumbosacral region: Secondary | ICD-10-CM | POA: Diagnosis not present

## 2020-04-08 DIAGNOSIS — M25579 Pain in unspecified ankle and joints of unspecified foot: Secondary | ICD-10-CM | POA: Diagnosis not present

## 2020-04-08 DIAGNOSIS — G43909 Migraine, unspecified, not intractable, without status migrainosus: Secondary | ICD-10-CM | POA: Diagnosis not present

## 2020-04-08 DIAGNOSIS — M25562 Pain in left knee: Secondary | ICD-10-CM | POA: Diagnosis not present

## 2020-04-10 ENCOUNTER — Telehealth: Payer: Self-pay | Admitting: Internal Medicine

## 2020-04-10 DIAGNOSIS — M1712 Unilateral primary osteoarthritis, left knee: Secondary | ICD-10-CM

## 2020-04-10 DIAGNOSIS — M5136 Other intervertebral disc degeneration, lumbar region: Secondary | ICD-10-CM

## 2020-04-10 NOTE — Telephone Encounter (Signed)
Patient requesting new pain management referral to [facility] Phone [phone]

## 2020-04-11 NOTE — Telephone Encounter (Signed)
ordered

## 2020-04-14 ENCOUNTER — Encounter: Payer: Self-pay | Admitting: Internal Medicine

## 2020-04-14 DIAGNOSIS — K2270 Barrett's esophagus without dysplasia: Secondary | ICD-10-CM | POA: Insufficient documentation

## 2020-04-15 ENCOUNTER — Other Ambulatory Visit: Payer: Self-pay

## 2020-04-15 ENCOUNTER — Emergency Department (HOSPITAL_COMMUNITY)
Admission: EM | Admit: 2020-04-15 | Discharge: 2020-04-16 | Disposition: A | Discharge status: Home / Self Care | Payer: Medicare HMO | Source: Home / Self Care

## 2020-04-15 ENCOUNTER — Encounter (HOSPITAL_COMMUNITY): Payer: Self-pay | Admitting: *Deleted

## 2020-04-15 DIAGNOSIS — Z5321 Procedure and treatment not carried out due to patient leaving prior to being seen by health care provider: Secondary | ICD-10-CM | POA: Insufficient documentation

## 2020-04-15 DIAGNOSIS — J45909 Unspecified asthma, uncomplicated: Secondary | ICD-10-CM | POA: Diagnosis not present

## 2020-04-15 DIAGNOSIS — I5032 Chronic diastolic (congestive) heart failure: Secondary | ICD-10-CM | POA: Diagnosis not present

## 2020-04-15 DIAGNOSIS — M791 Myalgia, unspecified site: Secondary | ICD-10-CM | POA: Diagnosis not present

## 2020-04-15 DIAGNOSIS — R110 Nausea: Secondary | ICD-10-CM | POA: Insufficient documentation

## 2020-04-15 DIAGNOSIS — R5383 Other fatigue: Secondary | ICD-10-CM | POA: Insufficient documentation

## 2020-04-15 DIAGNOSIS — Z20822 Contact with and (suspected) exposure to covid-19: Secondary | ICD-10-CM | POA: Diagnosis not present

## 2020-04-15 DIAGNOSIS — E119 Type 2 diabetes mellitus without complications: Secondary | ICD-10-CM | POA: Diagnosis not present

## 2020-04-15 DIAGNOSIS — Z794 Long term (current) use of insulin: Secondary | ICD-10-CM | POA: Diagnosis not present

## 2020-04-15 DIAGNOSIS — F17210 Nicotine dependence, cigarettes, uncomplicated: Secondary | ICD-10-CM | POA: Diagnosis not present

## 2020-04-15 DIAGNOSIS — R509 Fever, unspecified: Secondary | ICD-10-CM | POA: Diagnosis not present

## 2020-04-15 DIAGNOSIS — Z7982 Long term (current) use of aspirin: Secondary | ICD-10-CM | POA: Diagnosis not present

## 2020-04-15 DIAGNOSIS — R059 Cough, unspecified: Secondary | ICD-10-CM | POA: Insufficient documentation

## 2020-04-15 DIAGNOSIS — I110 Hypertensive heart disease with heart failure: Secondary | ICD-10-CM | POA: Diagnosis not present

## 2020-04-15 DIAGNOSIS — R519 Headache, unspecified: Secondary | ICD-10-CM | POA: Diagnosis not present

## 2020-04-15 DIAGNOSIS — Z8673 Personal history of transient ischemic attack (TIA), and cerebral infarction without residual deficits: Secondary | ICD-10-CM | POA: Diagnosis not present

## 2020-04-15 DIAGNOSIS — Z96652 Presence of left artificial knee joint: Secondary | ICD-10-CM | POA: Diagnosis not present

## 2020-04-15 DIAGNOSIS — J449 Chronic obstructive pulmonary disease, unspecified: Secondary | ICD-10-CM | POA: Diagnosis not present

## 2020-04-15 DIAGNOSIS — Z79899 Other long term (current) drug therapy: Secondary | ICD-10-CM | POA: Diagnosis not present

## 2020-04-15 DIAGNOSIS — I2510 Atherosclerotic heart disease of native coronary artery without angina pectoris: Secondary | ICD-10-CM | POA: Diagnosis not present

## 2020-04-15 NOTE — ED Triage Notes (Signed)
Pt complains of nausea, cough, fatigue. She was with someone 2 nights ago who tested positive for covid.

## 2020-04-16 ENCOUNTER — Other Ambulatory Visit: Payer: Self-pay

## 2020-04-16 ENCOUNTER — Encounter (HOSPITAL_COMMUNITY): Payer: Self-pay | Admitting: *Deleted

## 2020-04-16 ENCOUNTER — Emergency Department (HOSPITAL_COMMUNITY)
Admission: EM | Admit: 2020-04-16 | Discharge: 2020-04-16 | Disposition: A | Discharge status: Home / Self Care | Payer: Medicare HMO | Attending: Emergency Medicine | Admitting: Emergency Medicine

## 2020-04-16 ENCOUNTER — Emergency Department (HOSPITAL_COMMUNITY): Payer: Medicare HMO

## 2020-04-16 DIAGNOSIS — Z20822 Contact with and (suspected) exposure to covid-19: Secondary | ICD-10-CM

## 2020-04-16 DIAGNOSIS — J449 Chronic obstructive pulmonary disease, unspecified: Secondary | ICD-10-CM | POA: Insufficient documentation

## 2020-04-16 DIAGNOSIS — Z794 Long term (current) use of insulin: Secondary | ICD-10-CM | POA: Insufficient documentation

## 2020-04-16 DIAGNOSIS — Z8673 Personal history of transient ischemic attack (TIA), and cerebral infarction without residual deficits: Secondary | ICD-10-CM | POA: Insufficient documentation

## 2020-04-16 DIAGNOSIS — M791 Myalgia, unspecified site: Secondary | ICD-10-CM | POA: Diagnosis not present

## 2020-04-16 DIAGNOSIS — R059 Cough, unspecified: Secondary | ICD-10-CM | POA: Diagnosis not present

## 2020-04-16 DIAGNOSIS — I110 Hypertensive heart disease with heart failure: Secondary | ICD-10-CM | POA: Insufficient documentation

## 2020-04-16 DIAGNOSIS — R519 Headache, unspecified: Secondary | ICD-10-CM | POA: Diagnosis not present

## 2020-04-16 DIAGNOSIS — I5032 Chronic diastolic (congestive) heart failure: Secondary | ICD-10-CM | POA: Insufficient documentation

## 2020-04-16 DIAGNOSIS — I2510 Atherosclerotic heart disease of native coronary artery without angina pectoris: Secondary | ICD-10-CM | POA: Insufficient documentation

## 2020-04-16 DIAGNOSIS — F17210 Nicotine dependence, cigarettes, uncomplicated: Secondary | ICD-10-CM | POA: Insufficient documentation

## 2020-04-16 DIAGNOSIS — R509 Fever, unspecified: Secondary | ICD-10-CM | POA: Diagnosis not present

## 2020-04-16 DIAGNOSIS — J45909 Unspecified asthma, uncomplicated: Secondary | ICD-10-CM | POA: Insufficient documentation

## 2020-04-16 DIAGNOSIS — Z79899 Other long term (current) drug therapy: Secondary | ICD-10-CM | POA: Insufficient documentation

## 2020-04-16 DIAGNOSIS — E119 Type 2 diabetes mellitus without complications: Secondary | ICD-10-CM | POA: Insufficient documentation

## 2020-04-16 DIAGNOSIS — Z7982 Long term (current) use of aspirin: Secondary | ICD-10-CM | POA: Insufficient documentation

## 2020-04-16 DIAGNOSIS — Z96652 Presence of left artificial knee joint: Secondary | ICD-10-CM | POA: Insufficient documentation

## 2020-04-16 LAB — I-STAT CHEM 8, ED
BUN: 11 mg/dL (ref 6–20)
Calcium, Ion: 1.22 mmol/L (ref 1.15–1.40)
Chloride: 98 mmol/L (ref 98–111)
Creatinine, Ser: 0.7 mg/dL (ref 0.44–1.00)
Glucose, Bld: 165 mg/dL — ABNORMAL HIGH (ref 70–99)
HCT: 47 % — ABNORMAL HIGH (ref 36.0–46.0)
Hemoglobin: 16 g/dL — ABNORMAL HIGH (ref 12.0–15.0)
Potassium: 4.5 mmol/L (ref 3.5–5.1)
Sodium: 141 mmol/L (ref 135–145)
TCO2: 34 mmol/L — ABNORMAL HIGH (ref 22–32)

## 2020-04-16 LAB — POC SARS CORONAVIRUS 2 AG -  ED: SARS Coronavirus 2 Ag: NEGATIVE

## 2020-04-16 MED ORDER — ACETAMINOPHEN 500 MG PO TABS
1000.0000 mg | ORAL_TABLET | Freq: Once | ORAL | Status: AC
  Administered 2020-04-16: 1000 mg via ORAL
  Filled 2020-04-16: qty 2

## 2020-04-16 MED ORDER — TRAMADOL HCL 50 MG PO TABS
50.0000 mg | ORAL_TABLET | Freq: Once | ORAL | Status: AC
  Administered 2020-04-16: 50 mg via ORAL
  Filled 2020-04-16: qty 1

## 2020-04-16 NOTE — ED Provider Notes (Signed)
[facility] DEPT Provider Note   CSN: [id number] Arrival date & time: 04/16/20  1110     History Chief Complaint  Patient presents with  . Nausea  . Weakness    Tammy Strickland is a 60 y.o. female.  HPI Patient with multiple medical issues including COPD, ongoing cigarette addiction presents with 2 days of cough, headache, myalgia. Patient was well prior to the onset of illness, take medication as directed.  Now, in spite of taking ibuprofen, which only has transient relief, she is worsening overall symptoms. No confusion, disorientation, vision loss, chest pain, vomiting, diarrhea. She is accompanied by 2 grandchildren, with similar symptoms. She has 1 prior recent sick contact with positive coronavirus.    Past Medical History:  Diagnosis Date  . Anxiety   . Arthritis   . Asthma   . CHF (congestive heart failure) ([place])   . COPD (chronic obstructive pulmonary disease) ([place])   . Coronary artery disease   . Depression   . Diabetes mellitus without complication ([place])    type 2   . Fibromyalgia   . Gallstones   . GERD (gastroesophageal reflux disease)   . HLD (hyperlipidemia)   . Hypertension   . Lupus ([place])   . Migraines   . Neuromuscular disorder ([place])   . Osteoporosis   . Oxygen deficiency    pt uses 2.5L 02 at night,(08/25/2019)pt states she no longer uses oxygen, hasn't used in 7-8 years  . Pancreatitis   . Peripheral neuropathy   . Shingles   . Sleep apnea    had sleep study done recently ; unaware if she will be getting  a CPAP device ; patient states "im pretty sure i have it , i fall alseep all the time "  . Stroke [facility]) 10/2015    Patient Active Problem List   Diagnosis Date Noted  . Barrett's esophagus 04/14/2020  . Right groin pain 11/07/2019  . Rib pain on right side 11/07/2019  . Upper abdominal pain 09/19/2019  . Cough 09/19/2019  . Nausea & vomiting 08/15/2019  . Pancreatitis 05/27/2019  . Severe obesity (BMI >=  40) ([place]) 05/24/2019  . TMJ arthralgia 04/26/2019  . Unilateral primary osteoarthritis, right knee 01/16/2019  . Urinary frequency 11/01/2018  . Vaginal yeast infection 11/01/2018  . Anxiety 11/01/2018  . RUQ pain 06/22/2018  . Fatty liver 06/19/2018  . Lactic acid acidosis 06/15/2018  . Transaminitis 06/15/2018  . Chronic bilateral low back pain without sciatica 05/17/2018  . Boil 05/12/2018  . It band syndrome, left 12/29/2017  . Trochanteric bursitis, left hip 12/29/2017  . History of left knee replacement 04/21/2017  . Pain in left ankle and joints of left foot 04/21/2017  . Pain in left hand 04/21/2017  . OSA (obstructive sleep apnea) 02/23/2017  . Acute gouty arthritis 12/25/2016  . Stroke ([place])   . Chronic fatigue 11/09/2016  . Hypoxia 11/09/2016  . Morbidly obese ([place]) 11/09/2016  . Rash and nonspecific skin eruption 07/21/2016  . Frequent headaches 07/21/2016  . Allergic rhinitis 07/16/2016  . Vitamin D deficiency 04/16/2016  . Encounter for other preprocedural examination 03/12/2016  . Hyperlipidemia 01/15/2016  . Diabetes ([place]) 11/26/2015  . Chronic diastolic congestive heart failure ([place]) 11/06/2015  . TIA (transient ischemic attack)   . Carotid-cavernous fistula   . Insomnia 10/10/2015  . Depression 10/10/2015  . Benign essential HTN 08/15/2015  . Fibromyalgia 08/15/2015  . Peripheral neuropathy 08/15/2015  . CAD (coronary artery disease) 08/15/2015  .  COPD (chronic obstructive pulmonary disease) ([place]) 08/15/2015  . Osteoporosis 08/15/2015  . 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 DDD (degenerative disc disease), lumbar 08/15/2015  . Occupational exposure to industrial toxins 08/15/2015  . GERD (gastroesophageal reflux disease) 08/15/2015    Past Surgical History:  Procedure Laterality Date  . ABDOMINAL HYSTERECTOMY     ovaries left  . APPENDECTOMY    . BREAST LUMPECTOMY WITH RADIOACTIVE SEED LOCALIZATION Left 06/14/2019   Procedure: LEFT BREAST LUMPECTOMY WITH  RADIOACTIVE SEED LOCALIZATION X 2;  Surgeon: Coralie Keens, MD;  Location: [facility];  Service: General;  Laterality: Left;  LMA  . CHOLECYSTECTOMY    . IR GENERIC HISTORICAL  11/07/2015   IR ANGIO INTRA EXTRACRAN SEL COM CAROTID INNOMINATE BILAT MOD SED 11/07/2015 Luanne Bras, MD [facility]  . IR GENERIC HISTORICAL  11/07/2015   IR ANGIO VERTEBRAL SEL SUBCLAVIAN INNOMINATE UNI R MOD SED 11/07/2015 Luanne Bras, MD [facility]  . IR GENERIC HISTORICAL  11/07/2015   IR ANGIO VERTEBRAL SEL VERTEBRAL UNI L MOD SED 11/07/2015 Luanne Bras, MD [facility]  . IR GENERIC HISTORICAL  11/07/2015   IR ANGIOGRAM EXTREMITY LEFT 11/07/2015 Luanne Bras, MD [facility]  . TONSILLECTOMY    . TOTAL KNEE ARTHROPLASTY Left 03/12/2017   Procedure: LEFT TOTAL KNEE ARTHROPLASTY;  Surgeon: Mcarthur Rossetti, MD;  Location: [facility];  Service: Orthopedics;  Laterality: Left;  Adductor Block     OB History    Gravida  3   Para  3   Term  3   Preterm      AB      Living  3     SAB      IAB      Ectopic      Multiple      Live Births  3           Family History  Problem Relation Age of Onset  . Hyperlipidemia Mother   . Hypertension Mother   . Stroke Mother   . Thyroid disease Mother   . Heart attack Mother   . Hyperlipidemia Father   . Hypertension Father   . Stroke Father   . Heart attack Father   . Hypertension Sister   . Stroke Sister   . Thyroid disease Sister   . Breast cancer Sister   . Crohn's disease Sister   . Hypertension Sister   . Hypertension Brother   . Diabetes Brother   . Hypertension Brother     Social History   Tobacco Use  . Smoking status: Current Every Day Smoker    Packs/day: 0.25    Years: 35.00    Pack years: 8.75    Types: Cigarettes  . Smokeless tobacco: Never Used  . Tobacco comment: referred  to smoking  cessation  classes. at  [facility] Use  . Vaping Use: Never used  Substance Use Topics  . Alcohol use:  Yes    Comment: occasional  . Drug use: No    Home Medications Prior to Admission medications   Medication Sig Start Date End Date Taking? Authorizing Provider  ACCU-CHEK GUIDE test strip TEST FOUR TIMES DAILY Patient taking differently: 1 each by Other route 4 (four) times daily. E11.65 10/24/19   Binnie Rail, MD  Accu-Chek Softclix Lancets lancets 1 each by Other route 4 (four) times daily. E11.65    [provider]  amitriptyline (ELAVIL) 75 MG tablet Take 1 tablet by mouth at bedtime 11/08/19  Binnie Rail, MD  aspirin EC 81 MG tablet Take 1 tablet (81 mg total) by mouth daily. 11/01/15   Elgergawy, Silver Huguenin, MD  atorvastatin (LIPITOR) 20 MG tablet Take 1 tablet by mouth every day 01/12/20   Binnie Rail, MD  Blood Glucose Monitoring Suppl (ACCU-CHEK GUIDE) w/Device KIT 1 each by Does not apply route 4 (four) times daily. E11.65    [provider]  budesonide-formoterol (SYMBICORT) 80-4.5 MCG/ACT inhaler Inhale 2 puffs into the lungs as needed (wheezing/SOB). 01/12/20   Binnie Rail, MD  cloNIDine (CATAPRES) 0.3 MG tablet Take 1 tablet by mouth every day 11/08/19   Binnie Rail, MD  fluticasone [facility]) 50 MCG/ACT nasal spray Shake liquid & use 1 spray into each nostril every day as needed for allergies 12/25/19   Binnie Rail, MD  furosemide (LASIX) 80 MG tablet Take 1 tablet by mouth every day 11/08/19   Binnie Rail, MD  gabapentin (NEURONTIN) 600 MG tablet Take 2 tablets by mouth 3 times a day, need appt 03/04/20   Binnie Rail, MD  insulin glargine, 2 Unit Dial, (TOUJEO MAX SOLOSTAR) 300 UNIT/ML Solostar Pen Inject 140 Units into the skin every morning. And pen needles 1/day 01/25/20   Renato Shin, MD  Insulin Pen Needle (PEN NEEDLES) 32G X 5 MM MISC UAD daily for insulin 01/17/20   Binnie Rail, MD  losartan (COZAAR) 50 MG tablet Take 1 tablet by mouth twice daily 11/08/19   Binnie Rail, MD  meloxicam (MOBIC) 15 MG tablet Take 15 mg by mouth daily.  02/09/20   [provider]  metoprolol (TOPROL-XL) 200 MG 24 hr tablet Take 1 tablet by mouth twice daily 11/08/19   Binnie Rail, MD  [facility] 4 MG/0.1ML LIQD nasal spray kit SMARTSIG:1 Spray(s) Both Nares Once PRN 11/17/19   [provider]  nitroGLYCERIN (NITROSTAT) 0.4 MG SL tablet Place 0.4 mg under the tongue every 5 (five) minutes as needed for chest pain.    [provider]  oxyCODONE-acetaminophen (PERCOCET) 10-325 MG tablet Take 1 tablet by mouth every 4 (four) hours as needed. 10/19/19   [provider]  pantoprazole (PROTONIX) 40 MG tablet Take one tablet by mouth twice daily (am and pm) 01/24/20   Burns, Claudina Lick, MD  potassium chloride SA (KLOR-CON) 20 MEQ tablet Take 1 tablet by mouth every day 11/08/19   Binnie Rail, MD  tiZANidine (ZANAFLEX) 4 MG tablet Take 1 tablet by mouth at bedtime 10/06/19   Binnie Rail, MD    Allergies    Metformin and related, Other, Ozempic (0.25 or 0.5 mg-dose) [semaglutide(0.25 or 0.8m-dos)], and Bacitracin  Review of Systems   Review of Systems  Constitutional:       Per HPI, otherwise negative  HENT:       Per HPI, otherwise negative  Respiratory:       Per HPI, otherwise negative  Cardiovascular:       Per HPI, otherwise negative  Gastrointestinal: Negative for vomiting.  Endocrine:       Negative aside from HPI  Genitourinary:       Neg aside from HPI   Musculoskeletal:       Per HPI, otherwise negative  Skin: Negative.   Allergic/Immunologic: Negative for immunocompromised state.  Neurological: Positive for headaches. Negative for syncope.    Physical Exam Updated Vital Signs BP 138/82 (BP Location: Left Arm)   Pulse 70   Temp 98.8 F (37.1  C) (Oral)   Resp 18   Ht '5\' 6"'  (1.676 m)   Wt (!) 138.3 kg   SpO2 96%   BMI 49.23 kg/m   Physical Exam Vitals and nursing note reviewed.  Constitutional:      General: She is not in acute distress.    Appearance: She is well-developed and  well-nourished.  HENT:     Head: Normocephalic and atraumatic.  Eyes:     Extraocular Movements: EOM normal.     Conjunctiva/sclera: Conjunctivae normal.  Cardiovascular:     Rate and Rhythm: Normal rate and regular rhythm.  Pulmonary:     Effort: Pulmonary effort is normal. No respiratory distress.     Breath sounds: Normal breath sounds. No stridor.  Abdominal:     General: There is no distension.  Musculoskeletal:        General: No edema.  Skin:    General: Skin is warm and dry.  Neurological:     Mental Status: She is alert and oriented to person, place, and time.     Cranial Nerves: No cranial nerve deficit.     Motor: No weakness.  Psychiatric:        Mood and Affect: Mood and affect and mood normal.        Behavior: Behavior normal.     ED Results / Procedures / Treatments   Labs (all labs ordered are listed, but only abnormal results are displayed) Labs Reviewed  I-STAT CHEM 8, ED - Abnormal; Notable for the following components:      Result Value   Glucose, Bld 165 (*)    TCO2 34 (*)    Hemoglobin 16.0 (*)    HCT 47.0 (*)    All other components within normal limits  POC SARS CORONAVIRUS 2 AG -  ED    EKG None  Radiology DG Chest Port 1 View  Result Date: 04/16/2020 CLINICAL DATA:  Cough, fever.  Exposure to COVID-19 EXAM: PORTABLE CHEST 1 VIEW COMPARISON:  09/19/2019 FINDINGS: The heart size and mediastinal contours are within normal limits. Subtly increased perihilar and bibasilar interstitial markings. No lobar airspace consolidation. No pleural effusion or pneumothorax. The visualized skeletal structures are unremarkable. IMPRESSION: Subtly increased perihilar and bibasilar interstitial markings, nonspecific, but could represent developing atypical/viral infection. Electronically Signed   By: Davina Poke D.O.   On: 04/16/2020 16:31    Procedures Procedures (including critical care time)  Medications Ordered in ED Medications  traMADol (ULTRAM)  tablet 50 mg (has no administration in time range)  acetaminophen (TYLENOL) tablet 1,000 mg (1,000 mg Oral Given 04/16/20 1659)    ED Course  I have reviewed the triage vital signs and the nursing notes.  Pertinent labs & imaging results that were available during my care of the patient were reviewed by me and considered in my medical decision making (see chart for details).    MDM Rules/Calculators/A&P                          6:30 PM Patient in no distress, sitting upright, speaking clearly.  Lunch remains hemodynamically unremarkable.  She has requested discharge. I have reviewed the patient's labs, x-ray, cussed them with her She has no new oxygen requirement, no hemodynamic instability. Some suspicion for Covid, with false negative given her symptoms, exposure, but absent evidence for bacteremia, sepsis, she was counseled on home care, return precautions, follow-up instructions, and need for repeat testing should she change. Other labs  reassuring, mild hyperglycemia, no evidence for DKA, no other substantial electrolyte abnormalities.  Patient's request for discharge was accommodated, she was discharged in stable condition.   Final Clinical Impression(s) / ED Diagnoses Final diagnoses:  Bad headache  Myalgia  Exposure to COVID-19 virus     Carmin Muskrat, MD 04/16/20 [phone]

## 2020-04-16 NOTE — ED Triage Notes (Signed)
Exposed to Covid over the weekend, nausea, fatigue, general body aches.

## 2020-04-16 NOTE — Discharge Instructions (Signed)
As discussed, your evaluation today has been largely reassuring.  But, it is important that you monitor your condition carefully, and do not hesitate to return to the ED if you develop new, or concerning changes in your condition.  Your Covid test was negative, though this may represent a false result, as your close exposure to someone with the disease, and ongoing symptoms are consistent with the illness.  Please follow-up with your physician for appropriate ongoing care.

## 2020-04-18 ENCOUNTER — Telehealth: Payer: Self-pay | Admitting: Internal Medicine

## 2020-04-18 ENCOUNTER — Ambulatory Visit: Payer: Medicare Other | Admitting: Internal Medicine

## 2020-04-18 ENCOUNTER — Encounter (HOSPITAL_COMMUNITY): Payer: Self-pay | Admitting: Emergency Medicine

## 2020-04-18 ENCOUNTER — Other Ambulatory Visit: Payer: Self-pay

## 2020-04-18 ENCOUNTER — Emergency Department (HOSPITAL_COMMUNITY): Payer: Medicare HMO

## 2020-04-18 ENCOUNTER — Emergency Department (HOSPITAL_COMMUNITY)
Admission: EM | Admit: 2020-04-18 | Discharge: 2020-04-18 | Disposition: A | Discharge status: Home / Self Care | Payer: Medicare HMO | Attending: Emergency Medicine | Admitting: Emergency Medicine

## 2020-04-18 DIAGNOSIS — Z20822 Contact with and (suspected) exposure to covid-19: Secondary | ICD-10-CM | POA: Insufficient documentation

## 2020-04-18 DIAGNOSIS — Z20828 Contact with and (suspected) exposure to other viral communicable diseases: Secondary | ICD-10-CM | POA: Diagnosis not present

## 2020-04-18 DIAGNOSIS — R6883 Chills (without fever): Secondary | ICD-10-CM | POA: Diagnosis not present

## 2020-04-18 DIAGNOSIS — Z5321 Procedure and treatment not carried out due to patient leaving prior to being seen by health care provider: Secondary | ICD-10-CM | POA: Diagnosis not present

## 2020-04-18 DIAGNOSIS — R0789 Other chest pain: Secondary | ICD-10-CM | POA: Diagnosis not present

## 2020-04-18 DIAGNOSIS — R5383 Other fatigue: Secondary | ICD-10-CM | POA: Insufficient documentation

## 2020-04-18 DIAGNOSIS — R6889 Other general symptoms and signs: Secondary | ICD-10-CM | POA: Diagnosis not present

## 2020-04-18 DIAGNOSIS — R0602 Shortness of breath: Secondary | ICD-10-CM | POA: Diagnosis not present

## 2020-04-18 DIAGNOSIS — R079 Chest pain, unspecified: Secondary | ICD-10-CM | POA: Diagnosis not present

## 2020-04-18 DIAGNOSIS — Z743 Need for continuous supervision: Secondary | ICD-10-CM | POA: Diagnosis not present

## 2020-04-18 LAB — BASIC METABOLIC PANEL
Anion gap: 10 (ref 5–15)
BUN: 10 mg/dL (ref 6–20)
CO2: 28 mmol/L (ref 22–32)
Calcium: 8.8 mg/dL — ABNORMAL LOW (ref 8.9–10.3)
Chloride: 99 mmol/L (ref 98–111)
Creatinine, Ser: 0.84 mg/dL (ref 0.44–1.00)
GFR, Estimated: >60 mL/min (ref >60)
Glucose, Bld: 209 mg/dL — ABNORMAL HIGH (ref 70–99)
Potassium: 4.6 mmol/L (ref 3.5–5.1)
Sodium: 137 mmol/L (ref 135–145)

## 2020-04-18 LAB — TROPONIN I (HIGH SENSITIVITY)
Troponin I (High Sensitivity): 5 ng/L (ref <18)
Troponin I (High Sensitivity): 5 ng/L (ref <18)

## 2020-04-18 LAB — CBC
HCT: 43.3 % (ref 36.0–46.0)
Hemoglobin: 13.6 g/dL (ref 12.0–15.0)
MCH: 27.4 pg (ref 26.0–34.0)
MCHC: 31.4 g/dL (ref 30.0–36.0)
MCV: 87.3 fL (ref 80.0–100.0)
Platelets: 210 10*3/uL (ref 150–400)
RBC: 4.96 MIL/uL (ref 3.87–5.11)
RDW: 15.4 % (ref 11.5–15.5)
WBC: 9.4 10*3/uL (ref 4.0–10.5)
nRBC: 0 % (ref 0.0–0.2)

## 2020-04-18 LAB — RESP PANEL BY RT-PCR (FLU A&B, COVID) ARPGX2
Influenza A by PCR: NEGATIVE
Influenza B by PCR: NEGATIVE
SARS Coronavirus 2 by RT PCR: NEGATIVE

## 2020-04-18 NOTE — ED Notes (Signed)
No answer for VS x3

## 2020-04-18 NOTE — Telephone Encounter (Signed)
[facility] FYI   Caller states she tested negative for covid but she had a small gettogether over new years. Felt bad the next morning and her daughter and grandbaby started having problems-- her grandkids tested positive for Covid and they all went to ER where she tested negative but Dr said she would be positive. Caller states the whole top of her chest has pressure from her L shoulder. she can breath in but cant exhale well. Mouth is so dry tongue feels like sand. Has a very bad headache too. No fever. Using zofran for nausea.   [facility] advised: Go to ED now  Patient understood and complied. Patient went to the ED 1.6.22.

## 2020-04-18 NOTE — Telephone Encounter (Signed)
Patient calling complaining of left shoulder to hand pressure and chest pain, states she also is covid positive. Transferred to [facility] for further evaluation.

## 2020-04-18 NOTE — ED Triage Notes (Signed)
Pt arrives to ED via [facility] with c/o of COVID exposure and chest pressure. Pt states that yesterday she developed genralzied chest pressure that is intermittent. States it radiates to neck and left arm. Pt has chills and fatigue over this week. Was seen on 1/6. Pt was exposed to COVID positive family members over the weekend.

## 2020-04-24 ENCOUNTER — Telehealth: Payer: Self-pay | Admitting: Internal Medicine

## 2020-04-24 ENCOUNTER — Ambulatory Visit: Payer: Medicare Other | Admitting: Endocrinology

## 2020-04-24 NOTE — Telephone Encounter (Signed)
LVM for pt to rtn my call to schedule AWV with [facility]. Please schedule this appt if pt calls the office.  °

## 2020-04-29 ENCOUNTER — Ambulatory Visit: Payer: Medicare HMO | Admitting: Internal Medicine

## 2020-05-01 ENCOUNTER — Ambulatory Visit: Payer: Medicare HMO | Admitting: Endocrinology

## 2020-05-02 ENCOUNTER — Encounter: Payer: Self-pay | Admitting: Internal Medicine

## 2020-05-02 ENCOUNTER — Other Ambulatory Visit: Payer: Self-pay

## 2020-05-02 NOTE — Progress Notes (Signed)
Virtual Visit via Video Note  I connected with Tammy Strickland on 05/02/20 at  8:45 AM EST by a video enabled telemedicine application and verified that I am speaking with the correct person using two identifiers.   I discussed the limitations of evaluation and management by telemedicine and the availability of in person appointments. The patient expressed understanding and agreed to proceed.  Present for the visit:  Myself, Dr Billey Gosling, Garnette Gunner.  The patient is currently at home and I am in the office.    No referring provider.    History of Present Illness: This is an acute visit to discuss referrals.  [facility] She is frustrated that the pain doctor does not examine her and try to figure out what is going on with her. He also has decreased the dose of her pain medication.  She has had multiple falls.     The bones in her hands hurt.  Her back and knees hurt.  She has a lot of muscle spasms across her back.  She is having panic attacks 6-7 times a day.    She typically gets these because of the back spasms and pain. She denies anxiety.  She is frustrated by the chronic pain.  She states she has lost a whole bunch of weight and has gained back relatively quickly. Her sugars are not controlled. She denies eating much.  She also states foot pain. It hurts to walk and her feet hurt and feel funny on the time. She thinks it could be neuropathy. She has been told that she has arthritis in her feet as well.  Social History   Socioeconomic History  . Marital status: Single    Spouse name: n/a  . Number of children: 3  . Years of education: 12+  . Highest education level: Not on file  Occupational History  . 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 Smoking status: Current Every Day Smoker    Packs/day: 0.25    Years: 35.00    Pack years: 8.75    Types:  Cigarettes  . Smokeless tobacco: Never Used  . Tobacco comment: referred  to smoking  cessation  classes. at  [facility] Use  . Vaping Use: Never used  Substance and Sexual Activity  . Alcohol use: Yes    Comment: occasional  . Drug use: No  . Sexual activity: Not Currently    Partners: Female    Birth control/protection: Surgical    Comment: hysterectomy  Other Topics Concern  . Not on file  Social History Narrative   Moved to [place] from [place], [place] February 2017, to help her daughter.   Lives with her daughter.   Sons live in [place].   She reports that there were originally 17 children in her family (she is the youngest), the oldest are deceased, some prior to her birth, and she isn't sure which were female/female or how they died.   Social Determinants of Health   Financial Resource Strain: Not on file  Food Insecurity: Not on file  Transportation Needs: Not on file  Physical Activity: Not on file  Stress: Not on file  Social Connections: Not on file     Observations/Objective: Appears well in NAD Breathing normally  Assessment and Plan:  See Problem List for Assessment and Plan of chronic medical problems.   Follow Up Instructions:  I discussed the assessment and treatment plan with the patient. The patient was provided an opportunity to ask questions and all were answered. The patient agreed with the plan and demonstrated an understanding of the instructions.   The patient was advised to call back or seek an in-person evaluation if the symptoms worsen or if the condition fails to improve as anticipated.    Binnie Rail, MD

## 2020-05-03 ENCOUNTER — Telehealth (INDEPENDENT_AMBULATORY_CARE_PROVIDER_SITE_OTHER): Payer: Medicare HMO | Admitting: Internal Medicine

## 2020-05-03 DIAGNOSIS — G8929 Other chronic pain: Secondary | ICD-10-CM | POA: Diagnosis not present

## 2020-05-03 DIAGNOSIS — M545 Low back pain, unspecified: Secondary | ICD-10-CM

## 2020-05-03 DIAGNOSIS — G6289 Other specified polyneuropathies: Secondary | ICD-10-CM | POA: Diagnosis not present

## 2020-05-03 MED ORDER — METHOCARBAMOL 500 MG PO TABS: 500.0000 mg | ORAL_TABLET | Freq: Four times a day (QID) | ORAL | 5 refills | Status: DC

## 2020-05-03 NOTE — Assessment & Plan Note (Signed)
Chronic Lower back pain and following with pain management Discussed the role of pain management and advised her to follow-up with the orthopedic she saw in the past for her back who may be able to evaluate more on what is causing her back pain and see if there is anything else they can recommend Deferred PT Having frequent muscle spasms Will stop tizanidine 4 mg at night and start methocarbamol 500 mg 4 times daily to see if that helps with some of the spasms

## 2020-05-03 NOTE — Assessment & Plan Note (Signed)
Chronic Having foot pain that is likely neuropathy related Stressed better control of her sugars-she does have an upcoming appointment with her endocrinologist Continue gabapentin 1200 mg 3 times daily

## 2020-05-06 DIAGNOSIS — G894 Chronic pain syndrome: Secondary | ICD-10-CM | POA: Diagnosis not present

## 2020-05-06 DIAGNOSIS — F1120 Opioid dependence, uncomplicated: Secondary | ICD-10-CM | POA: Diagnosis not present

## 2020-05-06 DIAGNOSIS — M25579 Pain in unspecified ankle and joints of unspecified foot: Secondary | ICD-10-CM | POA: Diagnosis not present

## 2020-05-06 DIAGNOSIS — J0111 Acute recurrent frontal sinusitis: Secondary | ICD-10-CM | POA: Diagnosis not present

## 2020-05-06 DIAGNOSIS — M545 Low back pain, unspecified: Secondary | ICD-10-CM | POA: Diagnosis not present

## 2020-05-06 DIAGNOSIS — M25549 Pain in joints of unspecified hand: Secondary | ICD-10-CM | POA: Diagnosis not present

## 2020-05-06 DIAGNOSIS — Z79891 Long term (current) use of opiate analgesic: Secondary | ICD-10-CM | POA: Diagnosis not present

## 2020-05-06 DIAGNOSIS — G43909 Migraine, unspecified, not intractable, without status migrainosus: Secondary | ICD-10-CM | POA: Diagnosis not present

## 2020-05-06 DIAGNOSIS — M797 Fibromyalgia: Secondary | ICD-10-CM | POA: Diagnosis not present

## 2020-05-06 DIAGNOSIS — J45991 Cough variant asthma: Secondary | ICD-10-CM | POA: Diagnosis not present

## 2020-05-06 DIAGNOSIS — J0110 Acute frontal sinusitis, unspecified: Secondary | ICD-10-CM | POA: Diagnosis not present

## 2020-05-06 DIAGNOSIS — M25552 Pain in left hip: Secondary | ICD-10-CM | POA: Diagnosis not present

## 2020-05-06 DIAGNOSIS — G8929 Other chronic pain: Secondary | ICD-10-CM | POA: Diagnosis not present

## 2020-05-06 DIAGNOSIS — M25562 Pain in left knee: Secondary | ICD-10-CM | POA: Diagnosis not present

## 2020-05-06 DIAGNOSIS — J3089 Other allergic rhinitis: Secondary | ICD-10-CM | POA: Diagnosis not present

## 2020-05-31 ENCOUNTER — Other Ambulatory Visit: Payer: Self-pay

## 2020-05-31 ENCOUNTER — Ambulatory Visit (INDEPENDENT_AMBULATORY_CARE_PROVIDER_SITE_OTHER): Payer: Medicare Other

## 2020-05-31 ENCOUNTER — Ambulatory Visit (INDEPENDENT_AMBULATORY_CARE_PROVIDER_SITE_OTHER): Payer: Medicare Other | Admitting: Endocrinology

## 2020-05-31 ENCOUNTER — Encounter: Payer: Self-pay | Admitting: Endocrinology

## 2020-05-31 VITALS — BP 130/90 | HR 67 | Ht 66.0 in | Wt 303.6 lb

## 2020-05-31 VITALS — BP 118/78 | HR 77 | Temp 98.2°F | Ht 65.0 in | Wt 300.0 lb

## 2020-05-31 DIAGNOSIS — Z0000 Encounter for general adult medical examination without abnormal findings: Secondary | ICD-10-CM | POA: Diagnosis not present

## 2020-05-31 DIAGNOSIS — Z794 Long term (current) use of insulin: Secondary | ICD-10-CM | POA: Diagnosis not present

## 2020-05-31 DIAGNOSIS — E1142 Type 2 diabetes mellitus with diabetic polyneuropathy: Secondary | ICD-10-CM

## 2020-05-31 LAB — POCT GLYCOSYLATED HEMOGLOBIN (HGB A1C): Hemoglobin A1C: 10.7 % — AB (ref 4.0–5.6)

## 2020-05-31 MED ORDER — TOUJEO MAX SOLOSTAR 300 UNIT/ML ~~LOC~~ SOPN: 170.0000 [IU] | PEN_INJECTOR | SUBCUTANEOUS | 3 refills | Status: DC

## 2020-05-31 MED ORDER — GLUCERNA HUNGER SMART SHAKE PO LIQD: 1.0000 | Freq: Two times a day (BID) | ORAL | 3 refills | Status: DC

## 2020-05-31 NOTE — Progress Notes (Signed)
Subjective:   Tammy Strickland is a 60 y.o. female who presents for Medicare Annual (Subsequent) preventive examination.  Review of Systems    No ROS. Medicare Wellness Visit. Additional risk factors are reflected in social history. Cardiac Risk Factors include: diabetes mellitus;dyslipidemia;family history of premature cardiovascular disease;hypertension;obesity (BMI >30kg/m2);smoking/ tobacco exposure     Objective:    Today's Vitals   05/31/20 0804  BP: 118/78  Pulse: 77  Temp: 98.2 F (36.8 C)  SpO2: 93%  Weight: 300 lb (136.1 kg)  Height: _0  (1.651 m)  PainSc: 0-No pain   Body mass index is 49.92 kg/m.  Advanced Directives 05/31/2020 04/16/2020 08/12/2019 06/14/2019 06/07/2019 05/27/2019 05/27/2019  Does Patient Have a Medical Advance Directive? _1  No No  Would patient like information on creating a medical advance directive? No - Patient declined - Yes (ED - Information included in AVS) No - Patient declined No - Patient declined No - Patient declined No - Patient declined    Current Medications (verified) Outpatient Encounter Medications as of 05/31/2020  Medication Sig  . ACCU-CHEK GUIDE test strip TEST FOUR TIMES DAILY (Patient taking differently: 1 each by Other route 4 (four) times daily. E11.65)  . Accu-Chek Softclix Lancets lancets 1 each by Other route 4 (four) times daily. E11.65  . amitriptyline (ELAVIL) 75 MG tablet Take 1 tablet by mouth at bedtime  . aspirin EC 81 MG tablet Take 1 tablet (81 mg total) by mouth daily.  Marland Kitchen atorvastatin (LIPITOR) 20 MG tablet Take 1 tablet by mouth every day  . Blood Glucose Monitoring Suppl (ACCU-CHEK GUIDE) w/Device KIT 1 each by Does not apply route 4 (four) times daily. E11.65  . budesonide-formoterol (SYMBICORT) 80-4.5 MCG/ACT inhaler Inhale 2 puffs into the lungs as needed (wheezing/SOB).  . cloNIDine (CATAPRES) 0.3 MG tablet Take 1 tablet by mouth every day  . fluticasone (FLONASE) 50 MCG/ACT nasal spray Shake  liquid & use 1 spray into each nostril every day as needed for allergies  . furosemide (LASIX) 80 MG tablet Take 1 tablet by mouth every day  . gabapentin (NEURONTIN) 600 MG tablet Take 2 tablets by mouth 3 times a day, need appt  . insulin glargine, 2 Unit Dial, (TOUJEO MAX SOLOSTAR) 300 UNIT/ML Solostar Pen Inject 140 Units into the skin every morning. And pen needles 1/day  . Insulin Pen Needle (PEN NEEDLES) 32G X 5 MM MISC UAD daily for insulin  . losartan (COZAAR) 50 MG tablet Take 1 tablet by mouth twice daily  . meloxicam (MOBIC) 15 MG tablet Take 15 mg by mouth daily.  . methocarbamol (ROBAXIN) 500 MG tablet Take 1 tablet (500 mg total) by mouth 4 (four) times daily.  . metoprolol (TOPROL-XL) 200 MG 24 hr tablet Take 1 tablet by mouth twice daily  . NARCAN 4 MG/0.1ML LIQD nasal spray kit SMARTSIG:1 Spray(s) Both Nares Once PRN  . nitroGLYCERIN (NITROSTAT) 0.4 MG SL tablet Place 0.4 mg under the tongue every 5 (five) minutes as needed for chest pain.  Marland Kitchen oxyCODONE-acetaminophen (PERCOCET) 10-325 MG tablet Take 1 tablet by mouth every 4 (four) hours as needed.  . pantoprazole (PROTONIX) 40 MG tablet Take one tablet by mouth twice daily (am and pm)  . potassium chloride SA (KLOR-CON) 20 MEQ tablet Take 1 tablet by mouth every day   No facility-administered encounter medications on file as of 05/31/2020.    Allergies (verified) Metformin and related, Other, Ozempic (0.25 or 0.5 mg-dose) [semaglutide(0.25 or 0.15m-dos)], and  Bacitracin   History: Past Medical History:  Diagnosis Date  . Anxiety   . Arthritis   . Asthma   . CHF (congestive heart failure) ([place])   . COPD (chronic obstructive pulmonary disease) ([place])   . Coronary artery disease   . Depression   . Diabetes mellitus without complication ([place])    type 2   . Fibromyalgia   . Gallstones   . GERD (gastroesophageal reflux disease)   . HLD (hyperlipidemia)   . Hypertension   . Lupus ([place])   . Migraines   . Neuromuscular  disorder ([place])   . Osteoporosis   . Oxygen deficiency    pt uses 2.5L 02 at night,(08/25/2019)pt states she no longer uses oxygen, hasn't used in 7-8 years  . Pancreatitis   . Peripheral neuropathy   . Shingles   . Sleep apnea    had sleep study done recently ; unaware if she will be getting  a CPAP device ; patient states "im pretty sure i have it , i fall alseep all the time "  . Stroke [facility]) 10/2015   Past Surgical History:  Procedure Laterality Date  . ABDOMINAL HYSTERECTOMY     ovaries left  . APPENDECTOMY    . BREAST LUMPECTOMY WITH RADIOACTIVE SEED LOCALIZATION Left 06/14/2019   Procedure: LEFT BREAST LUMPECTOMY WITH RADIOACTIVE SEED LOCALIZATION X 2;  Surgeon: Coralie Keens, MD;  Location: [facility];  Service: General;  Laterality: Left;  LMA  . CHOLECYSTECTOMY    . IR GENERIC HISTORICAL  11/07/2015   IR ANGIO INTRA EXTRACRAN SEL COM CAROTID INNOMINATE BILAT MOD SED 11/07/2015 Luanne Bras, MD [facility]  . IR GENERIC HISTORICAL  11/07/2015   IR ANGIO VERTEBRAL SEL SUBCLAVIAN INNOMINATE UNI R MOD SED 11/07/2015 Luanne Bras, MD [facility]  . IR GENERIC HISTORICAL  11/07/2015   IR ANGIO VERTEBRAL SEL VERTEBRAL UNI L MOD SED 11/07/2015 Luanne Bras, MD [facility]  . IR GENERIC HISTORICAL  11/07/2015   IR ANGIOGRAM EXTREMITY LEFT 11/07/2015 Luanne Bras, MD [facility]  . TONSILLECTOMY    . TOTAL KNEE ARTHROPLASTY Left 03/12/2017   Procedure: LEFT TOTAL KNEE ARTHROPLASTY;  Surgeon: Mcarthur Rossetti, MD;  Location: [facility];  Service: Orthopedics;  Laterality: Left;  Adductor Block   Family History  Problem Relation Age of Onset  . Hyperlipidemia Mother   . Hypertension Mother   . Stroke Mother   . Thyroid disease Mother   . Heart attack Mother   . Hyperlipidemia Father   . Hypertension Father   . Stroke Father   . Heart attack Father   . Hypertension Sister   . Stroke Sister   . Thyroid disease Sister   . Breast cancer Sister   . Crohn's  disease Sister   . Hypertension Sister   . Hypertension Brother   . Diabetes Brother   . Hypertension Brother    Social History   Socioeconomic History  . Marital status: Single    Spouse name: n/a  . Number of children: 3  . Years of education: 12+  . Highest education level: Not on file  Occupational History  . 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 Smoking status: Current Every Day Smoker    Packs/day: 0.25    Years: 35.00    Pack years: 8.75    Types: Cigarettes  . Smokeless tobacco: Never Used  . Tobacco comment: referred  to smoking  cessation  classes. at  [facility] Use  . Vaping Use: Never used  Substance and Sexual Activity  . Alcohol use: Yes    Comment: occasional  . Drug use: No  . Sexual activity: Not Currently    Partners: Female    Birth control/protection: Surgical    Comment: hysterectomy  Other Topics Concern  . Not on file  Social History Narrative   Moved to [place] from [place], [place] February 2017, to help her daughter.   Lives with her daughter.   Sons live in [place].   She reports that there were originally 17 children in her family (she is the youngest), the oldest are deceased, some prior to her birth, and she isn't sure which were female/female or how they died.   Social Determinants of Health   Financial Resource Strain: Low Risk   . Difficulty of Paying Living Expenses: Not hard at all  Food Insecurity: No Food Insecurity  . Worried About Charity fundraiser in the Last Year: Never true  . Ran Out of Food in the Last Year: Never true  Transportation Needs: No Transportation Needs  . Lack of Transportation (Medical): No  . Lack of Transportation (Non-Medical): No  Physical Activity: Inactive  . Days of Exercise per Week: 0 days  . Minutes of Exercise per Session: 0 min  Stress: No Stress Concern Present  . Feeling of  Stress : Not at all  Social Connections: Socially Isolated  . Frequency of Communication with Friends and Family: More than three times a week  . Frequency of Social Gatherings with Friends and Family: Once a week  . Attends Religious Services: Never  . Active Member of Clubs or Organizations: No  . Attends Archivist Meetings: Never  . Marital Status: Never married    Tobacco Counseling Ready to quit: Not Answered Counseling given: Not Answered Comment: referred  to smoking  cessation  classes. at  [facility]    Clinical Intake:  Pre-visit preparation completed: Yes  Pain : No/denies pain Pain Score: 0-No pain     BMI - recorded: 49.92 Nutritional Status: BMI > 30  Obese Nutritional Risks: None Diabetes: Yes CBG done?: No Did pt. bring in CBG monitor from home?: Yes Glucose Meter Downloaded?: No  How often do you need to have someone help you when you read instructions, pamphlets, or other written materials from your doctor or pharmacy?: 1 - Never What is the last grade level you completed in school?: 11th grade  Diabetic? yes  Interpreter Needed?: No  Information entered by :: Lisette Abu, LPN   Activities of Daily Living In your present state of health, do you have any difficulty performing the following activities: 05/31/2020 06/07/2019  Hearing? N -  Vision? N -  Difficulty concentrating or making decisions? N -  Walking or climbing stairs? Y Y  Dressing or bathing? N -  Doing errands, shopping? N N  Preparing Food and eating ? N -  Using the Toilet? N -  In the past six months, have you accidently leaked urine? N -  Do you have problems with loss of bowel control? N -  Managing your Medications? N -  Managing your Finances? N -  Housekeeping or managing your Housekeeping? N -  Some recent data might be hidden    Patient Care Team: Binnie Rail, MD as PCP - General (Internal Medicine) Greer Pickerel, MD as Consulting Physician (General  Surgery)  Indicate  any recent Medical Services you may have received from other than [facility] providers in the past year (date may be approximate).     Assessment:   This is a routine wellness examination for Brenly.  Hearing/Vision screen No exam data present  Dietary issues and exercise activities discussed: Current Exercise Habits: The patient does not participate in regular exercise at present, Exercise limited by: orthopedic condition(s);cardiac condition(s);respiratory conditions(s)  Goals    . <enter goal here>    . quit smoking, exercise more, do better at counting carbs, do portion control with food      Depression Screen PHQ 2/9 Scores 05/31/2020 05/24/2019 10/30/2016 01/03/2016 11/26/2015 08/15/2015  PHQ - 2 Score 0 0 2 1 0 0  PHQ- 9 Score - _0 - -    Fall Risk Fall Risk  05/31/2020 12/24/2016 10/30/2016 11/26/2015 08/15/2015  Falls in the past year? 1 No Yes Yes Yes  Number falls in past yr: 1 - 2 or more 1 2 or more  Injury with Fall? 1 - No - -  Risk for fall due to : Impaired balance/gait;Orthopedic patient;Medication side effect - Impaired vision;Impaired mobility;Impaired balance/gait - -    FALL RISK PREVENTION PERTAINING TO THE HOME:  Any stairs in or around the home? No  If so, are there any without handrails? No  Home free of loose throw rugs in walkways, pet beds, electrical cords, etc? Yes  Adequate lighting in your home to reduce risk of falls? Yes   ASSISTIVE DEVICES UTILIZED TO PREVENT FALLS:  Life alert? No  Use of a cane, walker or w/c? Yes  Grab bars in the bathroom? Yes  Shower chair or bench in shower? No  Elevated toilet seat or a handicapped toilet? No   TIMED UP AND GO:  Was the test performed? No .  Length of time to ambulate 10 feet: 0 sec.   Gait slow and steady without use of assistive device  Cognitive Function: Normal cognitive status assessed by direct observation by this Nurse Health Advisor. No abnormalities found.           Immunizations Immunization History  Administered Date(s) Administered  . Influenza,inj,Quad PF,6+ Mos 01/15/2016, 12/25/2016, 01/24/2018  . Pneumococcal Polysaccharide-23 04/16/2016  . Tdap 01/11/2018    TDAP status: Up to date  Flu Vaccine status: Declined, Education has been provided regarding the importance of this vaccine but patient still declined. Advised may receive this vaccine at local pharmacy or Health Dept. Aware to provide a copy of the vaccination record if obtained from local pharmacy or Health Dept. Verbalized acceptance and understanding.  Pneumococcal vaccine status: Declined,  Education has been provided regarding the importance of this vaccine but patient still declined. Advised may receive this vaccine at local pharmacy or Health Dept. Aware to provide a copy of the vaccination record if obtained from local pharmacy or Health Dept. Verbalized acceptance and understanding.   Covid-19 vaccine status: Declined, Education has been provided regarding the importance of this vaccine but patient still declined. Advised may receive this vaccine at local pharmacy or Health Dept.or vaccine clinic. Aware to provide a copy of the vaccination record if obtained from local pharmacy or Health Dept. Verbalized acceptance and understanding.  Qualifies for Shingles Vaccine? No   Zostavax completed No   Shingrix Completed?: No.    Education has been provided regarding the importance of this vaccine. Patient has been advised to call insurance company to determine out of pocket expense if they have not yet  received this vaccine. Advised may also receive vaccine at local pharmacy or Health Dept. Verbalized acceptance and understanding.  Screening Tests Health Maintenance  Topic Date Due  . OPHTHALMOLOGY EXAM  05/27/2019  . COVID-19 Vaccine (1) 06/16/2020 (Originally 04/03/1966)  . INFLUENZA VACCINE  07/11/2020 (Originally 11/12/2019)  . HEMOGLOBIN A1C  07/25/2020  . FOOT EXAM  01/24/2021   . MAMMOGRAM  05/01/2021  . COLONOSCOPY (Pts 45-11yr Insurance coverage will need to be confirmed)  02/23/2024  . TETANUS/TDAP  01/12/2028  . PNEUMOCOCCAL POLYSACCHARIDE VACCINE AGE 65-64 HIGH RISK  Completed  . Hepatitis C Screening  Completed  . HIV Screening  Completed    Health Maintenance  Health Maintenance Due  Topic Date Due  . OPHTHALMOLOGY EXAM  05/27/2019    Colorectal cancer screening: Type of screening: Colonoscopy. Completed 2021. Repeat every 10 years  Mammogram status: Completed 05/18/2019. Repeat every year  Lung Cancer Screening: (Low Dose CT Chest recommended if Age 59-80years, 30 pack-year currently smoking OR have quit w/in 15years.) does qualify.   Lung Cancer Screening Referral: no  Additional Screening:  Hepatitis C Screening: does qualify; Completed yes  Vision Screening: Recommended annual ophthalmology exams for early detection of glaucoma and other disorders of the eye. Is the patient up to date with their annual eye exam?  Yes  Who is the provider or what is the name of the office in which the patient attends annual eye exams? [facility] Cliche [facility]. If pt is not established with a provider, would they like to be referred to a provider to establish care? No .   Dental Screening: Recommended annual dental exams for proper oral hygiene  Community Resource Referral / Chronic Care Management: CRR required this visit?  No   CCM required this visit?  No      Plan:     I have personally reviewed and noted the following in the patient's chart:   . Medical and social history . Use of alcohol, tobacco or illicit drugs  . Current medications and supplements . Functional ability and status . Nutritional status . Physical activity . Advanced directives . List of other physicians . Hospitalizations, surgeries, and ER visits in previous 12 months . Vitals . Screenings to include cognitive, depression, and falls . Referrals and appointments  In  addition, I have reviewed and discussed with patient certain preventive protocols, quality metrics, and best practice recommendations. A written personalized care plan for preventive services as well as general preventive health recommendations were provided to patient.     SSheral Flow LPN   26/77/0340  Nurse Notes:  Medications reviewed with patient; patient is on opioids for pain.

## 2020-05-31 NOTE — Progress Notes (Signed)
Subjective:    Patient ID: Tammy Strickland, female    DOB: 1960/08/11, 60 y.o.   MRN: [id number]  HPI Pt returns for f/u of diabetes mellitus: DM type: Insulin-requiring type 2 Dx'ed: 3086 Complications: PN Therapy: insulin since 2017 GDM: never DKA: never Severe hypoglycemia: never Pancreatitis: once, in 2021, while on Ozempic Pancreatic imaging: normal on 2021 CT SDOH: none Other: she takes QD insulin, at least for now.   Interval history: Pt says she never misses the insulin.  Meter is downloaded today, and the printout is scanned into the record.  cbg varies from 201-384.  Main symptom is left foot pain.   Past Medical History:  Diagnosis Date  . Anxiety   . Arthritis   . Asthma   . CHF (congestive heart failure) ([place])   . COPD (chronic obstructive pulmonary disease) ([place])   . Coronary artery disease   . Depression   . Diabetes mellitus without complication ([place])    type 2   . Fibromyalgia   . Gallstones   . GERD (gastroesophageal reflux disease)   . HLD (hyperlipidemia)   . Hypertension   . Lupus ([place])   . Migraines   . Neuromuscular disorder ([place])   . Osteoporosis   . Oxygen deficiency    pt uses 2.5L 02 at night,(08/25/2019)pt states she no longer uses oxygen, hasn't used in 7-8 years  . Pancreatitis   . Peripheral neuropathy   . Shingles   . Sleep apnea    had sleep study done recently ; unaware if she will be getting  a CPAP device ; patient states "im pretty sure i have it , i fall alseep all the time "  . Stroke [facility]) 10/2015    Past Surgical History:  Procedure Laterality Date  . ABDOMINAL HYSTERECTOMY     ovaries left  . APPENDECTOMY    . BREAST LUMPECTOMY WITH RADIOACTIVE SEED LOCALIZATION Left 06/14/2019   Procedure: LEFT BREAST LUMPECTOMY WITH RADIOACTIVE SEED LOCALIZATION X 2;  Surgeon: Coralie Keens, MD;  Location: [place];  Service: General;  Laterality: Left;  LMA  . CHOLECYSTECTOMY    . IR GENERIC HISTORICAL  11/07/2015   IR ANGIO INTRA  EXTRACRAN SEL COM CAROTID INNOMINATE BILAT MOD SED 11/07/2015 Luanne Bras, MD [facility]  . IR GENERIC HISTORICAL  11/07/2015   IR ANGIO VERTEBRAL SEL SUBCLAVIAN INNOMINATE UNI R MOD SED 11/07/2015 Luanne Bras, MD [facility]  . IR GENERIC HISTORICAL  11/07/2015   IR ANGIO VERTEBRAL SEL VERTEBRAL UNI L MOD SED 11/07/2015 Luanne Bras, MD [facility]  . IR GENERIC HISTORICAL  11/07/2015   IR ANGIOGRAM EXTREMITY LEFT 11/07/2015 Luanne Bras, MD [facility]  . TONSILLECTOMY    . TOTAL KNEE ARTHROPLASTY Left 03/12/2017   Procedure: LEFT TOTAL KNEE ARTHROPLASTY;  Surgeon: Mcarthur Rossetti, MD;  Location: [facility];  Service: Orthopedics;  Laterality: Left;  Adductor Block    Social History   Socioeconomic History  . Marital status: Single    Spouse name: n/a  . Number of children: 3  . Years of education: 12+  . Highest education level: Not on file  Occupational History  . 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 Smoking status: Current Every Day Smoker    Packs/day: 0.25    Years: 35.00    Pack years: 8.75    Types: Cigarettes  . Smokeless tobacco: Never Used  . Tobacco comment: referred  to smoking  cessation  classes. at  [facility] Use  . Vaping Use: Never used  Substance and Sexual Activity  . Alcohol use: Yes    Comment: occasional  . Drug use: No  . Sexual activity: Not Currently    Partners: Female    Birth control/protection: Surgical    Comment: hysterectomy  Other Topics Concern  . Not on file  Social History Narrative   Moved to [place] from [place], [place] February 2017, to help her daughter.   Lives with her daughter.   Sons live in [place] and [place].   She reports that there were originally 17 children in her family (she is the youngest), the oldest are deceased, some prior to her birth, and she isn't sure which were female/female or how  they died.   Social Determinants of Health   Financial Resource Strain: Low Risk   . Difficulty of Paying Living Expenses: Not hard at all  Food Insecurity: No Food Insecurity  . Worried About Charity fundraiser in the Last Year: Never true  . Ran Out of Food in the Last Year: Never true  Transportation Needs: No Transportation Needs  . Lack of Transportation (Medical): No  . Lack of Transportation (Non-Medical): No  Physical Activity: Inactive  . Days of Exercise per Week: 0 days  . Minutes of Exercise per Session: 0 min  Stress: No Stress Concern Present  . Feeling of Stress : Not at all  Social Connections: Socially Isolated  . Frequency of Communication with Friends and Family: More than three times a week  . Frequency of Social Gatherings with Friends and Family: Once a week  . Attends Religious Services: Never  . Active Member of Clubs or Organizations: No  . Attends Archivist Meetings: Never  . Marital Status: Never married  Intimate Partner Violence: Not on file    Current Outpatient Medications on File Prior to Visit  Medication Sig Dispense Refill  . ACCU-CHEK GUIDE test strip TEST FOUR TIMES DAILY (Patient taking differently: 1 each by Other route 4 (four) times daily. E11.65) 100 strip 2  . Accu-Chek Softclix Lancets lancets 1 each by Other route 4 (four) times daily. E11.65    . amitriptyline (ELAVIL) 75 MG tablet Take 1 tablet by mouth at bedtime 30 tablet 5  . aspirin EC 81 MG tablet Take 1 tablet (81 mg total) by mouth daily.    Marland Kitchen atorvastatin (LIPITOR) 20 MG tablet Take 1 tablet by mouth every day 30 tablet 11  . Blood Glucose Monitoring Suppl (ACCU-CHEK GUIDE) w/Device KIT 1 each by Does not apply route 4 (four) times daily. E11.65    . budesonide-formoterol (SYMBICORT) 80-4.5 MCG/ACT inhaler Inhale 2 puffs into the lungs as needed (wheezing/SOB). 10.2 g 11  . cloNIDine (CATAPRES) 0.3 MG tablet Take 1 tablet by mouth every day 30 tablet 5  .  fluticasone (FLONASE) 50 MCG/ACT nasal spray Shake liquid & use 1 spray into each nostril every day as needed for allergies 48 g 0  . furosemide (LASIX) 80 MG tablet Take 1 tablet by mouth every day 30 tablet 5  . gabapentin (NEURONTIN) 600 MG tablet Take 2 tablets by mouth 3 times a day, need appt 180 tablet 2  . Insulin Pen Needle (PEN NEEDLES) 32G X 5 MM MISC UAD daily for insulin 90 each 1  . losartan (COZAAR) 50 MG tablet Take 1 tablet by mouth twice daily 60 tablet 5  . meloxicam (MOBIC)  15 MG tablet Take 15 mg by mouth daily.    . methocarbamol (ROBAXIN) 500 MG tablet Take 1 tablet (500 mg total) by mouth 4 (four) times daily. 120 tablet 5  . metoprolol (TOPROL-XL) 200 MG 24 hr tablet Take 1 tablet by mouth twice daily 60 tablet 5  . NARCAN 4 MG/0.1ML LIQD nasal spray kit SMARTSIG:1 Spray(s) Both Nares Once PRN    . nitroGLYCERIN (NITROSTAT) 0.4 MG SL tablet Place 0.4 mg under the tongue every 5 (five) minutes as needed for chest pain.    Marland Kitchen oxyCODONE-acetaminophen (PERCOCET) 10-325 MG tablet Take 1 tablet by mouth every 4 (four) hours as needed.    . pantoprazole (PROTONIX) 40 MG tablet Take one tablet by mouth twice daily (am and pm) 60 tablet 11  . potassium chloride SA (KLOR-CON) 20 MEQ tablet Take 1 tablet by mouth every day 30 tablet 5   No current facility-administered medications on file prior to visit.    Allergies  Allergen Reactions  . Metformin And Related Other (See Comments)    Lactic acidosis  . Other Other (See Comments)    H/o pancreatitis  . Ozempic (0.25 Or 0.5 Mg-Dose) [Semaglutide(0.25 Or 0.43m-Dos)]     H/o pancreatitis  . Bacitracin Hives    Family History  Problem Relation Age of Onset  . Hyperlipidemia Mother   . Hypertension Mother   . Stroke Mother   . Thyroid disease Mother   . Heart attack Mother   . Hyperlipidemia Father   . Hypertension Father   . Stroke Father   . Heart attack Father   . Hypertension Sister   . Stroke Sister   . Thyroid  disease Sister   . Breast cancer Sister   . Crohn's disease Sister   . Hypertension Sister   . Hypertension Brother   . Diabetes Brother   . Hypertension Brother     BP 130/90 (BP Location: Right Arm, Patient Position: Sitting, Cuff Size: Large)   Pulse 67   Ht '5\' 6"'  (1.676 m)   Wt (!) 303 lb 9.6 oz (137.7 kg)   SpO2 94%   BMI 49.00 kg/m    Review of Systems She denies hypoglycemia    Objective:   Physical Exam VITAL SIGNS:  See vs page GENERAL: no distress Pulses: dorsalis pedis intact bilat.   MSK: no deformity of the feet CV: 1+ bilat leg edema Skin:  no ulcer on the feet.  normal color and temp on the feet. Neuro: sensation is intact to touch on the feet Ext: there is bilateral onychomycosis of the toenails.  Both great toenails are absent.  No cause for the left foot pain is seen.    Lab Results  Component Value Date   HGBA1C 10.7 (A) 05/31/2020       Assessment & Plan:  Insulin-requiring type 2 DM, with PN: uncontrolled.   Patient Instructions  check your blood sugar twice a day.  vary the time of day when you check, between before the 3 meals, and at bedtime.  also check if you have symptoms of your blood sugar being too high or too low.  please keep a record of the readings and bring it to your next appointment here (or you can bring the meter itself).  You can write it on any piece of paper.  please call [facility] if your blood sugar goes below 70, or if you have a lot of readings over 200. I have sent a prescription to your  pharmacy, to increase the insulin to 170 units each morning.   I have also sent a prescription to your pharmacy, for the glucerna shakes Please come back for a follow-up appointment in 6 weeks.

## 2020-05-31 NOTE — Patient Instructions (Addendum)
check your blood sugar twice a day.  vary the time of day when you check, between before the 3 meals, and at bedtime.  also check if you have symptoms of your blood sugar being too high or too low.  please keep a record of the readings and bring it to your next appointment here (or you can bring the meter itself).  You can write it on any piece of paper.  please call us sooner if your blood sugar goes below 70, or if you have a lot of readings over 200. I have sent a prescription to your pharmacy, to increase the insulin to 170 units each morning.   I have also sent a prescription to your pharmacy, for the glucerna shakes Please come back for a follow-up appointment in 6 weeks.

## 2020-05-31 NOTE — Patient Instructions (Addendum)
Ms. Tammy Strickland , Thank you for taking time to come for your Medicare Wellness Visit. I appreciate your ongoing commitment to your health goals. Please review the following plan we discussed and let me know if I can assist you in the future.   Screening recommendations/referrals: Colonoscopy: done last 2021 Mammogram: 05/18/2019 Bone Density: never done Recommended yearly ophthalmology/optometry visit for glaucoma screening and checkup Recommended yearly dental visit for hygiene and checkup  Vaccinations: Influenza vaccine: declined Pneumococcal vaccine: declined Tdap vaccine: 01/11/2018; due every 10 years Shingles vaccine: declined  Covid-19: declined  Advanced directives: Advance directive discussed with you today. Even though you declined this today please call our office should you change your mind and we can give you the proper paperwork for you to fill out.  Conditions/risks identified: Yes; Reviewed health maintenance screenings with patient today and relevant education, vaccines, and/or referrals were provided. Please continue to do your personal lifestyle choices by: daily care of teeth and gums, regular physical activity (goal should be 5 days a week for 30 minutes), eat a healthy diet, avoid tobacco and drug use, limiting any alcohol intake, taking a low-dose aspirin (if not allergic or have been advised by your provider otherwise) and taking vitamins and minerals as recommended by your provider. Continue doing brain stimulating activities (puzzles, reading, adult coloring books, staying active) to keep memory sharp. Continue to eat heart healthy diet (full of fruits, vegetables, whole grains, lean protein, water--limit salt, fat, and sugar intake) and increase physical activity as tolerated.  Next appointment: Please schedule your next Medicare Wellness Visit with your Nurse Health Advisor in 1 year by calling [phone].  Preventive Care 40-64 Years, Female Preventive care refers to  lifestyle choices and visits with your health care provider that can promote health and wellness. What does preventive care include?  A yearly physical exam. This is also called an annual well check.  Dental exams once or twice a year.  Routine eye exams. Ask your health care provider how often you should have your eyes checked.  Personal lifestyle choices, including:  Daily care of your teeth and gums.  Regular physical activity.  Eating a healthy diet.  Avoiding tobacco and drug use.  Limiting alcohol use.  Practicing safe sex.  Taking low-dose aspirin daily starting at age 48.  Taking vitamin and mineral supplements as recommended by your health care provider. What happens during an annual well check? The services and screenings done by your health care provider during your annual well check will depend on your age, overall health, lifestyle risk factors, and family history of disease. Counseling  Your health care provider may ask you questions about your:  Alcohol use.  Tobacco use.  Drug use.  Emotional well-being.  Home and relationship well-being.  Sexual activity.  Eating habits.  Work and work Statistician.  Method of birth control.  Menstrual cycle.  Pregnancy history. Screening  You may have the following tests or measurements:  Height, weight, and BMI.  Blood pressure.  Lipid and cholesterol levels. These may be checked every 5 years, or more frequently if you are over 36 years old.  Skin check.  Lung cancer screening. You may have this screening every year starting at age 14 if you have a 30-pack-year history of smoking and currently smoke or have quit within the past 15 years.  Fecal occult blood test (FOBT) of the stool. You may have this test every year starting at age 73.  Flexible sigmoidoscopy or colonoscopy. You may have  a sigmoidoscopy every 5 years or a colonoscopy every 10 years starting at age 2.  Hepatitis C blood  test.  Hepatitis B blood test.  Sexually transmitted disease (STD) testing.  Diabetes screening. This is done by checking your blood sugar (glucose) after you have not eaten for a while (fasting). You may have this done every 1-3 years.  Mammogram. This may be done every 1-2 years. Talk to your health care provider about when you should start having regular mammograms. This may depend on whether you have a family history of breast cancer.  BRCA-related cancer screening. This may be done if you have a family history of breast, ovarian, tubal, or peritoneal cancers.  Pelvic exam and Pap test. This may be done every 3 years starting at age 46. Starting at age 6, this may be done every 5 years if you have a Pap test in combination with an HPV test.  Bone density scan. This is done to screen for osteoporosis. You may have this scan if you are at high risk for osteoporosis. Discuss your test results, treatment options, and if necessary, the need for more tests with your health care provider. Vaccines  Your health care provider may recommend certain vaccines, such as:  Influenza vaccine. This is recommended every year.  Tetanus, diphtheria, and acellular pertussis (Tdap, Td) vaccine. You may need a Td booster every 10 years.  Zoster vaccine. You may need this after age 89.  Pneumococcal 13-valent conjugate (PCV13) vaccine. You may need this if you have certain conditions and were not previously vaccinated.  Pneumococcal polysaccharide (PPSV23) vaccine. You may need one or two doses if you smoke cigarettes or if you have certain conditions. Talk to your health care provider about which screenings and vaccines you need and how often you need them. This information is not intended to replace advice given to you by your health care provider. Make sure you discuss any questions you have with your health care provider. Document Released: 04/26/2015 Document Revised: 12/18/2015 Document Reviewed:  01/29/2015 Elsevier Interactive Patient Education  2017 [employer] Prevention in the Home Falls can cause injuries. They can happen to people of all ages. There are many things you can do to make your home safe and to help prevent falls. What can I do on the outside of my home?  Regularly fix the edges of walkways and driveways and fix any cracks.  Remove anything that might make you trip as you walk through a door, such as a raised step or threshold.  Trim any bushes or trees on the path to your home.  Use bright outdoor lighting.  Clear any walking paths of anything that might make someone trip, such as rocks or tools.  Regularly check to see if handrails are loose or broken. Make sure that both sides of any steps have handrails.  Any raised decks and porches should have guardrails on the edges.  Have any leaves, snow, or ice cleared regularly.  Use sand or salt on walking paths during winter.  Clean up any spills in your garage right away. This includes oil or grease spills. What can I do in the bathroom?  Use night lights.  Install grab bars by the toilet and in the tub and shower. Do not use towel bars as grab bars.  Use non-skid mats or decals in the tub or shower.  If you need to sit down in the shower, use a plastic, non-slip stool.  Keep  the floor dry. Clean up any water that spills on the floor as soon as it happens.  Remove soap buildup in the tub or shower regularly.  Attach bath mats securely with double-sided non-slip rug tape.  Do not have throw rugs and other things on the floor that can make you trip. What can I do in the bedroom?  Use night lights.  Make sure that you have a light by your bed that is easy to reach.  Do not use any sheets or blankets that are too big for your bed. They should not hang down onto the floor.  Have a firm chair that has side arms. You can use this for support while you get dressed.  Do not have throw  rugs and other things on the floor that can make you trip. What can I do in the kitchen?  Clean up any spills right away.  Avoid walking on wet floors.  Keep items that you use a lot in easy-to-reach places.  If you need to reach something above you, use a strong step stool that has a grab bar.  Keep electrical cords out of the way.  Do not use floor polish or wax that makes floors slippery. If you must use wax, use non-skid floor wax.  Do not have throw rugs and other things on the floor that can make you trip. What can I do with my stairs?  Do not leave any items on the stairs.  Make sure that there are handrails on both sides of the stairs and use them. Fix handrails that are broken or loose. Make sure that handrails are as long as the stairways.  Check any carpeting to make sure that it is firmly attached to the stairs. Fix any carpet that is loose or worn.  Avoid having throw rugs at the top or bottom of the stairs. If you do have throw rugs, attach them to the floor with carpet tape.  Make sure that you have a light switch at the top of the stairs and the bottom of the stairs. If you do not have them, ask someone to add them for you. What else can I do to help prevent falls?  Wear shoes that:  Do not have high heels.  Have rubber bottoms.  Are comfortable and fit you well.  Are closed at the toe. Do not wear sandals.  If you use a stepladder:  Make sure that it is fully opened. Do not climb a closed stepladder.  Make sure that both sides of the stepladder are locked into place.  Ask someone to hold it for you, if possible.  Clearly mark and make sure that you can see:  Any grab bars or handrails.  First and last steps.  Where the edge of each step is.  Use tools that help you move around (mobility aids) if they are needed. These include:  Canes.  Walkers.  Scooters.  Crutches.  Turn on the lights when you go into a dark area. Replace any light  bulbs as soon as they burn out.  Set up your furniture so you have a clear path. Avoid moving your furniture around.  If any of your floors are uneven, fix them.  If there are any pets around you, be aware of where they are.  Review your medicines with your doctor. Some medicines can make you feel dizzy. This can increase your chance of falling. Ask your doctor what other things that you can do  to help prevent falls. This information is not intended to replace advice given to you by your health care provider. Make sure you discuss any questions you have with your health care provider. Document Released: 01/24/2009 Document Revised: 09/05/2015 Document Reviewed: 05/04/2014 Elsevier Interactive Patient Education  2017 [employer].

## 2020-06-02 ENCOUNTER — Other Ambulatory Visit: Payer: Self-pay | Admitting: Internal Medicine

## 2020-06-02 DIAGNOSIS — I10 Essential (primary) hypertension: Secondary | ICD-10-CM

## 2020-06-03 DIAGNOSIS — M797 Fibromyalgia: Secondary | ICD-10-CM | POA: Diagnosis not present

## 2020-06-03 DIAGNOSIS — M25552 Pain in left hip: Secondary | ICD-10-CM | POA: Diagnosis not present

## 2020-06-03 DIAGNOSIS — M545 Low back pain, unspecified: Secondary | ICD-10-CM | POA: Diagnosis not present

## 2020-06-03 DIAGNOSIS — J3089 Other allergic rhinitis: Secondary | ICD-10-CM | POA: Diagnosis not present

## 2020-06-03 DIAGNOSIS — G894 Chronic pain syndrome: Secondary | ICD-10-CM | POA: Diagnosis not present

## 2020-06-03 DIAGNOSIS — G43909 Migraine, unspecified, not intractable, without status migrainosus: Secondary | ICD-10-CM | POA: Diagnosis not present

## 2020-06-03 DIAGNOSIS — G8929 Other chronic pain: Secondary | ICD-10-CM | POA: Diagnosis not present

## 2020-06-03 DIAGNOSIS — M25562 Pain in left knee: Secondary | ICD-10-CM | POA: Diagnosis not present

## 2020-06-03 DIAGNOSIS — M25549 Pain in joints of unspecified hand: Secondary | ICD-10-CM | POA: Diagnosis not present

## 2020-06-03 DIAGNOSIS — J0110 Acute frontal sinusitis, unspecified: Secondary | ICD-10-CM | POA: Diagnosis not present

## 2020-06-03 DIAGNOSIS — M25579 Pain in unspecified ankle and joints of unspecified foot: Secondary | ICD-10-CM | POA: Diagnosis not present

## 2020-06-03 DIAGNOSIS — Z79891 Long term (current) use of opiate analgesic: Secondary | ICD-10-CM | POA: Diagnosis not present

## 2020-06-03 DIAGNOSIS — J0111 Acute recurrent frontal sinusitis: Secondary | ICD-10-CM | POA: Diagnosis not present

## 2020-06-04 DIAGNOSIS — R5383 Other fatigue: Secondary | ICD-10-CM | POA: Diagnosis not present

## 2020-06-04 DIAGNOSIS — E559 Vitamin D deficiency, unspecified: Secondary | ICD-10-CM | POA: Diagnosis not present

## 2020-06-05 ENCOUNTER — Other Ambulatory Visit: Payer: Self-pay | Admitting: Internal Medicine

## 2020-06-06 ENCOUNTER — Encounter: Payer: Self-pay | Admitting: Podiatrist

## 2020-06-06 ENCOUNTER — Other Ambulatory Visit: Payer: Self-pay | Admitting: Podiatrist

## 2020-06-06 ENCOUNTER — Other Ambulatory Visit: Payer: Self-pay

## 2020-06-06 ENCOUNTER — Ambulatory Visit (INDEPENDENT_AMBULATORY_CARE_PROVIDER_SITE_OTHER): Payer: Medicare Other

## 2020-06-06 ENCOUNTER — Ambulatory Visit (INDEPENDENT_AMBULATORY_CARE_PROVIDER_SITE_OTHER): Payer: Medicare Other | Admitting: Podiatrist

## 2020-06-06 DIAGNOSIS — M79672 Pain in left foot: Secondary | ICD-10-CM

## 2020-06-06 DIAGNOSIS — L84 Corns and callosities: Secondary | ICD-10-CM | POA: Diagnosis not present

## 2020-06-06 DIAGNOSIS — M79671 Pain in right foot: Secondary | ICD-10-CM | POA: Diagnosis not present

## 2020-06-06 DIAGNOSIS — Z794 Long term (current) use of insulin: Secondary | ICD-10-CM

## 2020-06-06 DIAGNOSIS — M216X1 Other acquired deformities of right foot: Secondary | ICD-10-CM

## 2020-06-06 DIAGNOSIS — B351 Tinea unguium: Secondary | ICD-10-CM

## 2020-06-06 DIAGNOSIS — M216X2 Other acquired deformities of left foot: Secondary | ICD-10-CM

## 2020-06-06 DIAGNOSIS — M79609 Pain in unspecified limb: Secondary | ICD-10-CM | POA: Diagnosis not present

## 2020-06-06 DIAGNOSIS — M7752 Other enthesopathy of left foot: Secondary | ICD-10-CM

## 2020-06-06 DIAGNOSIS — E0840 Diabetes mellitus due to underlying condition with diabetic neuropathy, unspecified: Secondary | ICD-10-CM | POA: Diagnosis not present

## 2020-06-06 NOTE — Patient Instructions (Signed)
Corns and Calluses Corns are small areas of thickened skin that form on the top, sides, or tip of a toe. Corns have a cone-shaped core with a point that can press on a nerve below. This causes pain. Calluses are areas of thickened skin that can form anywhere on the body, including the hands, fingers, palms, soles of the feet, and heels. Calluses are usually larger than corns. What are the causes? Corns and calluses are caused by rubbing (friction) or pressure, such as from shoes that are too tight or do not fit properly. What increases the risk? Corns are more likely to develop in people who have misshapen toes (toe deformities), such as hammer toes. Calluses can form with friction to any area of the skin. They are more likely to develop in people who:  Work with their hands.  Wear shoes that fit poorly, are too tight, or are high-heeled.  Have toe deformities. What are the signs or symptoms? Symptoms of a corn or callus include:  A hard growth on the skin.  Pain or tenderness under the skin.  Redness and swelling.  Increased discomfort while wearing tight-fitting shoes, if your feet are affected. If a corn or callus becomes infected, symptoms may include:  Redness and swelling that gets worse.  Pain.  Fluid, blood, or pus draining from the corn or callus.   How is this diagnosed? Corns and calluses may be diagnosed based on your symptoms, your medical history, and a physical exam. How is this treated? Treatment for corns and calluses may include:  Removing the cause of the friction or pressure. This may involve: ? Changing your shoes. ? Wearing shoe inserts (orthotics) or other protective layers in your shoes, such as a corn pad. ? Wearing gloves.  Applying medicine to the skin (topical medicine) to help soften skin in the hardened, thickened areas.  Removing layers of dead skin with a file to reduce the size of the corn or callus.  Removing the corn or callus with a  scalpel or laser.  Taking antibiotic medicines, if your corn or callus is infected.  Having surgery, if a toe deformity is the cause. Follow these instructions at home:  Take over-the-counter and prescription medicines only as told by your health care provider.  If you were prescribed an antibiotic medicine, take it as told by your health care provider. Do not stop taking it even if your condition improves.  Wear shoes that fit well. Avoid wearing high-heeled shoes and shoes that are too tight or too loose.  Wear any padding, protective layers, gloves, or orthotics as told by your health care provider.  Soak your hands or feet. Then use a file or pumice stone to soften your corn or callus. Do this as told by your health care provider.  Check your corn or callus every day for signs of infection.   Contact a health care provider if:  Your symptoms do not improve with treatment.  You have redness or swelling that gets worse.  Your corn or callus becomes painful.  You have fluid, blood, or pus coming from your corn or callus.  You have new symptoms. Get help right away if:  You develop severe pain with redness. Summary  Corns are small areas of thickened skin that form on the top, sides, or tip of a toe. These can be painful.  Calluses are areas of thickened skin that can form anywhere on the body, including the hands, fingers, palms, and soles of the   feet. Calluses are usually larger than corns.  Corns and calluses are caused by rubbing (friction) or pressure, such as from shoes that are too tight or do not fit properly.  Treatment may include wearing padding, protective layers, gloves, or orthotics as told by your health care provider. This information is not intended to replace advice given to you by your health care provider. Make sure you discuss any questions you have with your health care provider. Document Revised: 07/27/2019 Document Reviewed: 07/27/2019 Elsevier  Patient Education  2021 [employer]  

## 2020-06-10 MED ORDER — DEXAMETHASONE SODIUM PHOSPHATE 120 MG/30ML IJ SOLN
4.0000 mg | Freq: Once | INTRAMUSCULAR | Status: AC
  Administered 2020-06-06: 4 mg via INTRA_ARTICULAR

## 2020-06-10 NOTE — Progress Notes (Signed)
HPI: Patient is 60 y.o. [place] who presents today for pain submet 4/5  And lateral foot pain left greater than right. For the last couple weeks.  She denies any injury.  Relates 8/10 sharp pain when walking at times.  She has tried pain medication with no relief in symptoms.    Patient Active Problem List   Diagnosis Date Noted  . Prominent metatarsal head of left foot 06/06/2020  . Barrett's esophagus 04/14/2020  . Right groin pain 07/Tammy/2021  . Rib pain on right side 07/Tammy/2021  . Upper abdominal pain 09/19/2019  . Cough 09/19/2019  . Nausea & vomiting 08/15/2019  . Pancreatitis 05/27/2019  . Severe obesity (BMI >= 40) ([place]) 05/24/2019  . TMJ arthralgia 04/26/2019  . Unilateral primary osteoarthritis, right knee 01/16/2019  . Anxiety 11/01/2018  . RUQ pain 06/22/2018  . Fatty liver 06/19/2018  . Lactic acid acidosis 06/15/2018  . Transaminitis 06/15/2018  . Chronic bilateral low back pain without sciatica 05/17/2018  . Boil 05/12/2018  . It band syndrome, left 12/29/2017  . Trochanteric bursitis, left hip 12/29/2017  . History of left knee replacement 04/21/2017  . Pain in left ankle and joints of left foot 04/21/2017  . Pain in left hand 04/21/2017  . OSA (obstructive sleep apnea) 02/23/2017  . Acute gouty arthritis 12/25/2016  . Stroke ([place])   . Chronic fatigue 11/09/2016  . Hypoxia 11/09/2016  . Morbidly obese ([place]) 11/09/2016  . Rash and nonspecific skin eruption 07/21/2016  . Frequent headaches 07/21/2016  . Allergic rhinitis 07/16/2016  . Vitamin D deficiency 04/16/2016  . Hyperlipidemia 01/15/2016  . Diabetes ([place]) 11/26/2015  . Chronic diastolic congestive heart failure ([place]) 11/06/2015  . TIA (transient ischemic attack)   . Carotid-cavernous fistula   . Insomnia 10/10/2015  . Depression 10/10/2015  . Benign essential HTN 08/15/2015  . Fibromyalgia 08/15/2015  . Peripheral neuropathy 08/15/2015  . CAD (coronary artery disease) 08/15/2015  . COPD (chronic  obstructive pulmonary disease) ([place]) 08/15/2015  . Osteoporosis 08/15/2015  . 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 DDD (degenerative disc disease), lumbar 08/15/2015  . Occupational exposure to industrial toxins 08/15/2015  . GERD (gastroesophageal reflux disease) 08/15/2015    Current Outpatient Medications on File Prior to Visit  Medication Sig Dispense Refill  . ACCU-CHEK GUIDE test strip TEST FOUR TIMES DAILY (Patient taking differently: 1 each by Other route 4 (four) times daily. E11.65) 100 strip 2  . Accu-Chek Softclix Lancets lancets 1 each by Other route 4 (four) times daily. E11.65    . amitriptyline (ELAVIL) 75 MG tablet Take 1 tablet by mouth at bedtime 30 tablet 11  . aspirin EC 81 MG tablet Take 1 tablet (81 mg total) by mouth daily.    Marland Kitchen atorvastatin (LIPITOR) 20 MG tablet Take 1 tablet by mouth every day 30 tablet 11  . Blood Glucose Monitoring Suppl (ACCU-CHEK GUIDE) w/Device KIT 1 each by Does not apply route 4 (four) times daily. E11.65    . budesonide-formoterol (SYMBICORT) 80-4.5 MCG/ACT inhaler Inhale 2 puffs into the lungs as needed (wheezing/SOB). 10.2 g 11  . cloNIDine (CATAPRES) 0.3 MG tablet Take 1 tablet by mouth every day 30 tablet 11  . fluticasone (FLONASE) 50 MCG/ACT nasal spray Shake liquid & use 1 spray into each nostril every day as needed for allergies 16 g 11  . furosemide (LASIX) 80 MG tablet Take 1 tablet by mouth every day 30 tablet 11  . gabapentin (NEURONTIN) 600 MG tablet Take 2 tablets by  mouth 3 times a day, need appt 180 tablet 11  . hydrOXYzine (ATARAX/VISTARIL) 25 MG tablet Take 25-50 mg by mouth at bedtime as needed.    . insulin glargine, 2 Unit Dial, (TOUJEO MAX SOLOSTAR) 300 UNIT/ML Solostar Pen Inject 170 Units into the skin every morning. And pen needles 1/day 60 mL 3  . Insulin Pen Needle (PEN NEEDLES) 32G X 5 MM MISC UAD daily for insulin 90 each 1  . ketorolac (TORADOL) 10 MG tablet Take 10 mg by mouth every 8 (eight) hours.    Marland Kitchen losartan  (COZAAR) 50 MG tablet Take 1 tablet by mouth twice daily 60 tablet 11  . meloxicam (MOBIC) 15 MG tablet Take 15 mg by mouth daily.    . methocarbamol (ROBAXIN) 500 MG tablet Take 1 tablet (500 mg total) by mouth 4 (four) times daily. 120 tablet 5  . methylPREDNISolone (MEDROL DOSEPAK) 4 MG TBPK tablet Take by mouth as directed.    . metoprolol (TOPROL-XL) 200 MG 24 hr tablet Take 1 tablet by mouth twice daily 60 tablet 11  . NARCAN 4 MG/0.1ML LIQD nasal spray kit SMARTSIG:1 Spray(s) Both Nares Once PRN    . nitroGLYCERIN (NITROSTAT) 0.4 MG SL tablet Place 0.4 mg under the tongue every 5 (five) minutes as needed for chest pain.    . Nutritional Supplements (GLUCERNA HUNGER SMART SHAKE) LIQD Take 1 each by mouth 2 (two) times daily. 47300 mL 3  . oxyCODONE-acetaminophen (PERCOCET) 10-325 MG tablet Take 1 tablet by mouth every 4 (four) hours as needed.    . pantoprazole (PROTONIX) 40 MG tablet Take one tablet by mouth twice daily (am and pm) 60 tablet 11  . potassium chloride SA (KLOR-CON) 20 MEQ tablet Take 1 tablet by mouth every day 30 tablet 11  . tiZANidine (ZANAFLEX) 4 MG tablet Take 4 mg by mouth at bedtime.     No current facility-administered medications on file prior to visit.    Allergies  Allergen Reactions  . Metformin And Related Other (See Comments)    Lactic acidosis  . Other Other (See Comments)    H/o pancreatitis  . Ozempic (0.25 Or 0.5 Mg-Dose) [Semaglutide(0.25 Or 0.32m-Dos)]     H/o pancreatitis  . Bacitracin Hives    Review of Systems No fevers, chills, nausea, muscle aches, no difficulty breathing, no calf pain, no chest pain or shortness of breath.   Physical Exam  GENERAL APPEARANCE: Alert, conversant. Appropriately groomed. No acute distress.   VASCULAR: Pedal pulses palpable DP and PT bilateral.  Capillary refill time is immediate to all digits,  Proximal to distal cooling it warm to warm.  Digital perfusion adequate.   NEUROLOGIC: sensation is intact to  5.07 monofilament at 2/5 sites bilateral.  Light touch is intact bilateral, vibratory sensation intact bilateral. Subjective numbness and tingling reported.    MUSCULOSKELETAL: acceptable muscle strength, tone and stability bilateral.  No gross boney pedal deformities noted.  No pain, crepitus or limitation noted with foot and ankle range of motion bilateral.   DERMATOLOGIC: skin is warm, supple, and dry.  Calluses present bilateral fifth metatarsal heads with associated pain left greater than right. . .  Bilateral hallux nails where removed are healed nicely.  No subjective complaints reported.  Remainder of toenails are thick, discolored, dystrophic and clinically mycotic with subungual debris present.    Assessment     ICD-10-CM   1. Right foot pain  M79.671 CANCELED: DG Foot Complete Right  2. Prominent metatarsal head of left  foot  M21.6X2   3. Prominent metatarsal head of right foot  M21.6X1   4. Callus of foot  L84   5. Pain due to onychomycosis of nail  B35.1    M79.609   6. Diabetes mellitus due to underlying condition with diabetic neuropathy, with long-term current use of insulin (HCC)  E08.40    Z79.4   7. Bursitis of left foot  M77.52      Plan  Discussed exam and treatment options.  I recommended paring the callus and an injection along the fitth metatarsal head to decrease the inflammation and pain in the area.  The patient agreed and a sterile skin prep was applied.  An injection consisting of dexamethasone and marcaine mixture was infiltrated laterally along the fifth metatarsal.   The patient tolerated this well and was given instructions for aftercare.   Her toenails were also debrided x 8 without complication.  She will see how she does with the injection.  She will return for a recheck in 2-3 weeks if symptoms have not resolved.

## 2020-07-17 ENCOUNTER — Encounter: Payer: Self-pay | Admitting: Internal Medicine

## 2020-07-17 ENCOUNTER — Ambulatory Visit: Payer: Medicare Other | Admitting: Endocrinology

## 2020-07-17 NOTE — Progress Notes (Addendum)
Subjective:    Patient ID: Tammy Strickland, female    DOB: Oct 01, 1960, 60 y.o.   MRN: [id number]  HPI The patient is here for follow up of their chronic medical problems, including htn, DM, neuropathy, hyperlipidemia, elevated lfts, gerd, fatty liver with probably early cirrhosis  She is seeing Dr Loanne Drilling for her DM.    She has itching from her vaginal area to her anus.  She tried a monistat suppository.  She also did the three day monistat.  Neither has helped.  She has states a little vagina discharge - white.  She denies odor.    She has dry spots on her hands.  They come up as hard, dry spots and then peel and become dark spots. They eventually go away.  She denies general hand dryness.  They do not itch.    She has a bump in her right lower eyelid and a bump in her lip.    She thinks she is having anxiety attacks - occurs 2-3 times a day.  When she bends over and ties her shoe - she feels sob and it triggers it.  It is always when she is bending over.    Medications and allergies reviewed with patient and updated if appropriate.  Patient Active Problem List   Diagnosis Date Noted  . Mucous retention cyst of lip 07/18/2020  . Hordeolum externum of right lower eyelid 07/18/2020  . Pica in adults 07/18/2020  . Prominent metatarsal head of left foot 06/06/2020  . Barrett's esophagus 04/14/2020  . Right groin pain 11/07/2019  . Rib pain on right side 11/07/2019  . Upper abdominal pain 09/19/2019  . Cough 09/19/2019  . Nausea & vomiting 08/15/2019  . Pancreatitis 05/27/2019  . Severe obesity (BMI >= 40) ([place]) 05/24/2019  . TMJ arthralgia 04/26/2019  . Unilateral primary osteoarthritis, right knee 01/16/2019  . Vaginal candidiasis 11/01/2018  . Anxiety 11/01/2018  . RUQ pain 06/22/2018  . Fatty liver 06/19/2018  . Lactic acid acidosis 06/15/2018  . Transaminitis 06/15/2018  . Chronic bilateral low back pain without sciatica 05/17/2018  . Boil 05/12/2018  . It band  syndrome, left 12/29/2017  . Trochanteric bursitis, left hip 12/29/2017  . History of left knee replacement 04/21/2017  . Pain in left ankle and joints of left foot 04/21/2017  . Pain in left hand 04/21/2017  . OSA (obstructive sleep apnea) 02/23/2017  . Acute gouty arthritis 12/25/2016  . Stroke ([place])   . Chronic fatigue 11/09/2016  . Hypoxia 11/09/2016  . Morbidly obese ([place]) 11/09/2016  . Rash and nonspecific skin eruption 07/21/2016  . Frequent headaches 07/21/2016  . Allergic rhinitis 07/16/2016  . Vitamin D deficiency 04/16/2016  . Hyperlipidemia 01/15/2016  . Diabetes with neurologic complications ([place]) 70/96/2836  . Chronic diastolic congestive heart failure ([place]) 11/06/2015  . TIA (transient ischemic attack)   . Carotid-cavernous fistula   . Insomnia 10/10/2015  . Depression 10/10/2015  . Benign essential HTN 08/15/2015  . Fibromyalgia 08/15/2015  . Peripheral neuropathy 08/15/2015  . CAD (coronary artery disease) 08/15/2015  . COPD (chronic obstructive pulmonary disease) ([place]) 08/15/2015  . Osteoporosis 08/15/2015  . 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 DDD (degenerative disc disease), lumbar 08/15/2015  . Occupational exposure to industrial toxins 08/15/2015  . GERD (gastroesophageal reflux disease) 08/15/2015    Current Outpatient Medications on File Prior to Visit  Medication Sig Dispense Refill  . ACCU-CHEK GUIDE test strip TEST FOUR TIMES DAILY (Patient taking differently: 1 each by Other  route 4 (four) times daily. E11.65) 100 strip 2  . Accu-Chek Softclix Lancets lancets 1 each by Other route 4 (four) times daily. E11.65    . amitriptyline (ELAVIL) 75 MG tablet Take 1 tablet by mouth at bedtime 30 tablet 11  . aspirin EC 81 MG tablet Take 1 tablet (81 mg total) by mouth daily.    . atorvastatin (LIPITOR) 20 MG tablet Take 1 tablet by mouth every day 30 tablet 11  . Blood Glucose Monitoring Suppl (ACCU-CHEK GUIDE) w/Device KIT 1 each by Does not apply route 4 (four)  times daily. E11.65    . budesonide-formoterol (SYMBICORT) 80-4.5 MCG/ACT inhaler Inhale 2 puffs into the lungs as needed (wheezing/SOB). 10.2 g 11  . cloNIDine (CATAPRES) 0.3 MG tablet Take 1 tablet by mouth every day 30 tablet 11  . fluticasone (FLONASE) 50 MCG/ACT nasal spray Shake liquid & use 1 spray into each nostril every day as needed for allergies 16 g 11  . furosemide (LASIX) 80 MG tablet Take 1 tablet by mouth every day 30 tablet 11  . gabapentin (NEURONTIN) 600 MG tablet Take 2 tablets by mouth 3 times a day, need appt 180 tablet 11  . hydrOXYzine (ATARAX/VISTARIL) 25 MG tablet Take 25-50 mg by mouth at bedtime as needed.    . insulin glargine, 2 Unit Dial, (TOUJEO MAX SOLOSTAR) 300 UNIT/ML Solostar Pen Inject 170 Units into the skin every morning. And pen needles 1/day 60 mL 3  . Insulin Pen Needle (PEN NEEDLES) 32G X 5 MM MISC UAD daily for insulin 90 each 1  . ketorolac (TORADOL) 10 MG tablet Take 10 mg by mouth every 8 (eight) hours.    . losartan (COZAAR) 50 MG tablet Take 1 tablet by mouth twice daily 60 tablet 11  . meloxicam (MOBIC) 15 MG tablet Take 15 mg by mouth daily.    . methocarbamol (ROBAXIN) 500 MG tablet Take 1 tablet (500 mg total) by mouth 4 (four) times daily. 120 tablet 5  . metoprolol (TOPROL-XL) 200 MG 24 hr tablet Take 1 tablet by mouth twice daily 60 tablet 11  . NARCAN 4 MG/0.1ML LIQD nasal spray kit SMARTSIG:1 Spray(s) Both Nares Once PRN    . nitroGLYCERIN (NITROSTAT) 0.4 MG SL tablet Place 0.4 mg under the tongue every 5 (five) minutes as needed for chest pain.    . Nutritional Supplements (GLUCERNA HUNGER SMART SHAKE) LIQD Take 1 each by mouth 2 (two) times daily. 47300 mL 3  . oxyCODONE-acetaminophen (PERCOCET) 10-325 MG tablet Take 1 tablet by mouth every 4 (four) hours as needed.    . pantoprazole (PROTONIX) 40 MG tablet Take one tablet by mouth twice daily (am and pm) 60 tablet 11  . potassium chloride SA (KLOR-CON) 20 MEQ tablet Take 1 tablet by  mouth every day 30 tablet 11  . tiZANidine (ZANAFLEX) 4 MG tablet Take 4 mg by mouth at bedtime.    . hydrOXYzine (ATARAX/VISTARIL) 10 MG tablet SMARTSIG:1 Tablet(s) By Mouth Every 12 Hours    . OZEMPIC, 1 MG/DOSE, 4 MG/3ML SOPN Inject into the skin.    . TOUJEO SOLOSTAR 300 UNIT/ML Solostar Pen Inject into the skin.    . ZTLIDO 1.8 % PTCH SMARTSIG:1 Patch(s) Topical 3 Times Daily     No current facility-administered medications on file prior to visit.    Past Medical History:  Diagnosis Date  . Anxiety   . Arthritis   . Asthma   . CHF (congestive heart failure) ([facility])   .   COPD (chronic obstructive pulmonary disease) ([place])   . Coronary artery disease   . Depression   . Diabetes mellitus without complication ([place])    type 2   . Fibromyalgia   . Gallstones   . GERD (gastroesophageal reflux disease)   . HLD (hyperlipidemia)   . Hypertension   . Lupus ([place])   . Migraines   . Neuromuscular disorder ([place])   . Osteoporosis   . Oxygen deficiency    pt uses 2.5L 02 at night,(08/25/2019)pt states she no longer uses oxygen, hasn't used in 7-8 years  . Pancreatitis   . Peripheral neuropathy   . Shingles   . Sleep apnea    had sleep study done recently ; unaware if she will be getting  a CPAP device ; patient states "im pretty sure i have it , i fall alseep all the time "  . Stroke [facility]) 10/2015    Past Surgical History:  Procedure Laterality Date  . ABDOMINAL HYSTERECTOMY     ovaries left  . APPENDECTOMY    . BREAST LUMPECTOMY WITH RADIOACTIVE SEED LOCALIZATION Left 06/14/2019   Procedure: LEFT BREAST LUMPECTOMY WITH RADIOACTIVE SEED LOCALIZATION X 2;  Surgeon: Coralie Keens, MD;  Location: [place];  Service: General;  Laterality: Left;  LMA  . CHOLECYSTECTOMY    . IR GENERIC HISTORICAL  11/07/2015   IR ANGIO INTRA EXTRACRAN SEL COM CAROTID INNOMINATE BILAT MOD SED 11/07/2015 Luanne Bras, MD [facility]  . IR GENERIC HISTORICAL  11/07/2015   IR ANGIO VERTEBRAL SEL SUBCLAVIAN  INNOMINATE UNI R MOD SED 11/07/2015 Luanne Bras, MD [facility]  . IR GENERIC HISTORICAL  11/07/2015   IR ANGIO VERTEBRAL SEL VERTEBRAL UNI L MOD SED 11/07/2015 Luanne Bras, MD [facility]  . IR GENERIC HISTORICAL  11/07/2015   IR ANGIOGRAM EXTREMITY LEFT 11/07/2015 Luanne Bras, MD [facility]  . TONSILLECTOMY    . TOTAL KNEE ARTHROPLASTY Left 03/12/2017   Procedure: LEFT TOTAL KNEE ARTHROPLASTY;  Surgeon: Mcarthur Rossetti, MD;  Location: [facility];  Service: Orthopedics;  Laterality: Left;  Adductor Block    Social History   Socioeconomic History  . Marital status: Single    Spouse name: n/a  . Number of children: 3  . Years of education: 12+  . Highest education level: Not on file  Occupational History  . 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 Smoking status: Current Every Day Smoker    Packs/day: 0.25    Years: 35.00    Pack years: 8.75    Types: Cigarettes  . Smokeless tobacco: Never Used  . Tobacco comment: referred  to smoking  cessation  classes. at  [facility] Use  . Vaping Use: Never used  Substance and Sexual Activity  . Alcohol use: Yes    Comment: occasional  . Drug use: No  . Sexual activity: Not Currently    Partners: Female    Birth control/protection: Surgical    Comment: hysterectomy  Other Topics Concern  . Not on file  Social History Narrative   Moved to [place] from [place], [place] February 2017, to help her daughter.   Lives with her daughter.   Sons live in [place] and [place].   She reports that there were originally 17 children in her family (she is the youngest), the oldest are deceased, some prior to her birth, and she isn't sure which were female/female or how they died.  Social Determinants of Health   Financial Resource Strain: Low Risk   . Difficulty of Paying Living Expenses: Not hard at all  Food Insecurity: No Food  Insecurity  . Worried About Running Out of Food in the Last Year: Never true  . Ran Out of Food in the Last Year: Never true  Transportation Needs: No Transportation Needs  . Lack of Transportation (Medical): No  . Lack of Transportation (Non-Medical): No  Physical Activity: Inactive  . Days of Exercise per Week: 0 days  . Minutes of Exercise per Session: 0 min  Stress: No Stress Concern Present  . Feeling of Stress : Not at all  Social Connections: Socially Isolated  . Frequency of Communication with Friends and Family: More than three times a week  . Frequency of Social Gatherings with Friends and Family: Once a week  . Attends Religious Services: Never  . Active Member of Clubs or Organizations: No  . Attends Club or Organization Meetings: Never  . Marital Status: Never married    Family History  Problem Relation Age of Onset  . Hyperlipidemia Mother   . Hypertension Mother   . Stroke Mother   . Thyroid disease Mother   . Heart attack Mother   . Hyperlipidemia Father   . Hypertension Father   . Stroke Father   . Heart attack Father   . Hypertension Sister   . Stroke Sister   . Thyroid disease Sister   . Breast cancer Sister   . Crohn's disease Sister   . Hypertension Sister   . Hypertension Brother   . Diabetes Brother   . Hypertension Brother     Review of Systems  Constitutional: Negative for chills and fever.  Respiratory: Positive for cough (occ), shortness of breath (a little with exertion) and wheezing (occ).   Cardiovascular: Negative for chest pain, palpitations and leg swelling.  Gastrointestinal: Negative for abdominal pain.  Neurological: Positive for headaches (left posterior head). Negative for light-headedness.       Objective:   Vitals:   07/18/20 0855  BP: 134/80  Pulse: 70  Resp: 18  Temp: 98.3 F (36.8 C)   BP Readings from Last 3 Encounters:  07/18/20 134/80  05/31/20 130/90  05/31/20 118/78   Wt Readings from Last 3 Encounters:   07/18/20 (!) 305 lb (138.3 kg)  05/31/20 (!) 303 lb 9.6 oz (137.7 kg)  05/31/20 300 lb (136.1 kg)   Body mass index is 49.23 kg/m.   Physical Exam    Constitutional: Appears well-developed and well-nourished. No distress.  HENT:  Head: Normocephalic and atraumatic.  Neck: Neck supple. No tracheal deviation present. No thyromegaly present.  No cervical lymphadenopathy Cardiovascular: Normal rate, regular rhythm and normal heart sounds.   No murmur heard. No carotid bruit .  No edema Pulmonary/Chest: Effort normal and breath sounds normal. No respiratory distress. No has no wheezes. No rales.  Skin: Skin is warm and dry. Not diaphoretic. A few dry callus looking area on b/l hands.  mucuos cyst left lower lip, stye or cyst in right lower lid Psychiatric: Normal mood and affect. Behavior is normal.      Assessment & Plan:    See Problem List for Assessment and Plan of chronic medical problems.    This visit occurred during the SARS-CoV-2 public health emergency.  Safety protocols were in place, including screening questions prior to the visit, additional usage of staff PPE, and extensive cleaning of exam room while observing appropriate contact time as   indicated for disinfecting solutions.    

## 2020-07-17 NOTE — Patient Instructions (Addendum)
  Blood work was ordered.     Medications changes include :   Diflucan for the yeast infection and eye ointment   Do warm compresses for your eye   Your prescription(s) have been submitted to your pharmacy. Please take as directed and contact our office if you believe you are having problem(s) with the medication(s).     Please followup in 6 months

## 2020-07-18 ENCOUNTER — Other Ambulatory Visit: Payer: Self-pay

## 2020-07-18 ENCOUNTER — Ambulatory Visit (INDEPENDENT_AMBULATORY_CARE_PROVIDER_SITE_OTHER): Payer: 59 | Admitting: Internal Medicine

## 2020-07-18 VITALS — BP 134/80 | HR 70 | Temp 98.3°F | Resp 18 | Ht 66.0 in | Wt 305.0 lb

## 2020-07-18 DIAGNOSIS — Z794 Long term (current) use of insulin: Secondary | ICD-10-CM

## 2020-07-18 DIAGNOSIS — G6289 Other specified polyneuropathies: Secondary | ICD-10-CM | POA: Diagnosis not present

## 2020-07-18 DIAGNOSIS — E782 Mixed hyperlipidemia: Secondary | ICD-10-CM | POA: Diagnosis not present

## 2020-07-18 DIAGNOSIS — I10 Essential (primary) hypertension: Secondary | ICD-10-CM

## 2020-07-18 DIAGNOSIS — E559 Vitamin D deficiency, unspecified: Secondary | ICD-10-CM

## 2020-07-18 DIAGNOSIS — B3731 Acute candidiasis of vulva and vagina: Secondary | ICD-10-CM

## 2020-07-18 DIAGNOSIS — H00012 Hordeolum externum right lower eyelid: Secondary | ICD-10-CM

## 2020-07-18 DIAGNOSIS — K219 Gastro-esophageal reflux disease without esophagitis: Secondary | ICD-10-CM

## 2020-07-18 DIAGNOSIS — F5089 Other specified eating disorder: Secondary | ICD-10-CM

## 2020-07-18 DIAGNOSIS — E1142 Type 2 diabetes mellitus with diabetic polyneuropathy: Secondary | ICD-10-CM

## 2020-07-18 DIAGNOSIS — K130 Diseases of lips: Secondary | ICD-10-CM

## 2020-07-18 DIAGNOSIS — B373 Candidiasis of vulva and vagina: Secondary | ICD-10-CM

## 2020-07-18 LAB — VITAMIN D 25 HYDROXY (VIT D DEFICIENCY, FRACTURES): VITD: 19.65 ng/mL — ABNORMAL LOW (ref 30.00–100.00)

## 2020-07-18 LAB — CBC WITH DIFFERENTIAL/PLATELET
Basophils Absolute: 0.1 10*3/uL (ref 0.0–0.1)
Basophils Relative: 0.8 % (ref 0.0–3.0)
Eosinophils Absolute: 0.3 10*3/uL (ref 0.0–0.7)
Eosinophils Relative: 3.3 % (ref 0.0–5.0)
HCT: 44.2 % (ref 36.0–46.0)
Hemoglobin: 14.5 g/dL (ref 12.0–15.0)
Lymphocytes Relative: 47.8 % — ABNORMAL HIGH (ref 12.0–46.0)
Lymphs Abs: 4.9 10*3/uL — ABNORMAL HIGH (ref 0.7–4.0)
MCHC: 32.9 g/dL (ref 30.0–36.0)
MCV: 83.4 fl (ref 78.0–100.0)
Monocytes Absolute: 0.8 10*3/uL (ref 0.1–1.0)
Monocytes Relative: 7.8 % (ref 3.0–12.0)
Neutro Abs: 4.1 10*3/uL (ref 1.4–7.7)
Neutrophils Relative %: 40.3 % — ABNORMAL LOW (ref 43.0–77.0)
Platelets: 270 10*3/uL (ref 150.0–400.0)
RBC: 5.3 Mil/uL — ABNORMAL HIGH (ref 3.87–5.11)
RDW: 15.7 % — ABNORMAL HIGH (ref 11.5–15.5)
WBC: 10.2 10*3/uL (ref 4.0–10.5)

## 2020-07-18 LAB — LIPID PANEL
Cholesterol: 191 mg/dL (ref 0–200)
HDL: 54.3 mg/dL (ref >39.00)
LDL Cholesterol: 108 mg/dL — ABNORMAL HIGH (ref 0–99)
NonHDL: 136.48
Total CHOL/HDL Ratio: 4
Triglycerides: 143 mg/dL (ref 0.0–149.0)
VLDL: 28.6 mg/dL (ref 0.0–40.0)

## 2020-07-18 LAB — COMPREHENSIVE METABOLIC PANEL
ALT: 66 U/L — ABNORMAL HIGH (ref 0–35)
AST: 109 U/L — ABNORMAL HIGH (ref 0–37)
Albumin: 4 g/dL (ref 3.5–5.2)
Alkaline Phosphatase: 159 U/L — ABNORMAL HIGH (ref 39–117)
BUN: 11 mg/dL (ref 6–23)
CO2: 35 mEq/L — ABNORMAL HIGH (ref 19–32)
Calcium: 9.5 mg/dL (ref 8.4–10.5)
Chloride: 96 mEq/L (ref 96–112)
Creatinine, Ser: 0.77 mg/dL (ref 0.40–1.20)
GFR: 85.11 mL/min (ref >60.00)
Glucose, Bld: 160 mg/dL — ABNORMAL HIGH (ref 70–99)
Potassium: 4.9 mEq/L (ref 3.5–5.1)
Sodium: 138 mEq/L (ref 135–145)
Total Bilirubin: 0.5 mg/dL (ref 0.2–1.2)
Total Protein: 7.5 g/dL (ref 6.0–8.3)

## 2020-07-18 MED ORDER — FLUCONAZOLE 150 MG PO TABS: ORAL_TABLET | ORAL | 0 refills | Status: DC

## 2020-07-18 MED ORDER — ERYTHROMYCIN 5 MG/GM OP OINT: 1.0000 "application " | TOPICAL_OINTMENT | Freq: Every day | OPHTHALMIC | 0 refills | Status: DC

## 2020-07-18 NOTE — Assessment & Plan Note (Signed)
Acute Mucous cyst left lower lip Reassured  May go away on its own - it not could have it removed

## 2020-07-18 NOTE — Assessment & Plan Note (Addendum)
Chronic GERD controlled Continue protonix 40 mg bid

## 2020-07-18 NOTE — Assessment & Plan Note (Signed)
Acute Diflucan 150 mg Q 72 hrs x 3 doses Stressed getting sugars controlled

## 2020-07-18 NOTE — Assessment & Plan Note (Signed)
Chronic Eats ice all day long every day ? Iron def Check iron levels

## 2020-07-18 NOTE — Assessment & Plan Note (Signed)
Acute Stye vs cyst Non-tender Warm compresses Can try erythromycin ointment at night

## 2020-07-18 NOTE — Assessment & Plan Note (Signed)
Chronic Management per Dr Loanne Drilling Encouraged improving diet and working harder on weight loss

## 2020-07-18 NOTE — Assessment & Plan Note (Addendum)
Chronic BP well controlled Continue clonidine 0.3 mg daily, lasix 80 mg qd, losartan 50 mg qd, metoprolol xl 200 mg bid,  cmp

## 2020-07-18 NOTE — Assessment & Plan Note (Signed)
Chronic Check lipid panel  Continue atorvastatin 20 mg daily Regular exercise and healthy diet encouraged  

## 2020-07-18 NOTE — Assessment & Plan Note (Addendum)
Chronic Stable Continue gabapentin 1200 mg TID Stressed importance of getting sugars controlled

## 2020-07-18 NOTE — Assessment & Plan Note (Signed)
Chronic Taking vitamin D daily Check vitamin D level  

## 2020-07-19 LAB — IRON,TIBC AND FERRITIN PANEL
%SAT: 14 % (calc) — ABNORMAL LOW (ref 16–45)
Ferritin: 66 ng/mL (ref 16–232)
Iron: 56 ug/dL (ref 45–160)
TIBC: 411 mcg/dL (calc) (ref 250–450)

## 2020-07-24 ENCOUNTER — Ambulatory Visit: Payer: 59 | Admitting: Endocrinology

## 2020-08-06 ENCOUNTER — Other Ambulatory Visit: Payer: Self-pay | Admitting: Internal Medicine

## 2020-08-31 ENCOUNTER — Other Ambulatory Visit: Payer: Self-pay

## 2020-08-31 ENCOUNTER — Encounter (HOSPITAL_COMMUNITY): Payer: Self-pay | Admitting: Emergency Medicine

## 2020-08-31 ENCOUNTER — Emergency Department (HOSPITAL_COMMUNITY): Payer: 59

## 2020-08-31 ENCOUNTER — Inpatient Hospital Stay (HOSPITAL_COMMUNITY)
Admission: EM | Admit: 2020-08-31 | Discharge: 2020-09-03 | DRG: 177 | Disposition: A | Discharge status: Home / Self Care | Payer: 59 | Attending: Internal Medicine | Admitting: Internal Medicine

## 2020-08-31 DIAGNOSIS — Z28310 Unvaccinated for covid-19: Secondary | ICD-10-CM

## 2020-08-31 DIAGNOSIS — Z6841 Body Mass Index (BMI) 40.0 and over, adult: Secondary | ICD-10-CM

## 2020-08-31 DIAGNOSIS — M25561 Pain in right knee: Secondary | ICD-10-CM

## 2020-08-31 DIAGNOSIS — I10 Essential (primary) hypertension: Secondary | ICD-10-CM | POA: Diagnosis present

## 2020-08-31 DIAGNOSIS — M797 Fibromyalgia: Secondary | ICD-10-CM | POA: Diagnosis present

## 2020-08-31 DIAGNOSIS — Z716 Tobacco abuse counseling: Secondary | ICD-10-CM

## 2020-08-31 DIAGNOSIS — Z86718 Personal history of other venous thrombosis and embolism: Secondary | ICD-10-CM

## 2020-08-31 DIAGNOSIS — Z79899 Other long term (current) drug therapy: Secondary | ICD-10-CM

## 2020-08-31 DIAGNOSIS — J9601 Acute respiratory failure with hypoxia: Secondary | ICD-10-CM | POA: Diagnosis present

## 2020-08-31 DIAGNOSIS — U071 COVID-19: Secondary | ICD-10-CM | POA: Diagnosis not present

## 2020-08-31 DIAGNOSIS — Z833 Family history of diabetes mellitus: Secondary | ICD-10-CM

## 2020-08-31 DIAGNOSIS — I5032 Chronic diastolic (congestive) heart failure: Secondary | ICD-10-CM | POA: Diagnosis present

## 2020-08-31 DIAGNOSIS — Z90710 Acquired absence of both cervix and uterus: Secondary | ICD-10-CM

## 2020-08-31 DIAGNOSIS — Z8249 Family history of ischemic heart disease and other diseases of the circulatory system: Secondary | ICD-10-CM

## 2020-08-31 DIAGNOSIS — M810 Age-related osteoporosis without current pathological fracture: Secondary | ICD-10-CM | POA: Diagnosis present

## 2020-08-31 DIAGNOSIS — K760 Fatty (change of) liver, not elsewhere classified: Secondary | ICD-10-CM | POA: Diagnosis present

## 2020-08-31 DIAGNOSIS — Z888 Allergy status to other drugs, medicaments and biological substances status: Secondary | ICD-10-CM

## 2020-08-31 DIAGNOSIS — F419 Anxiety disorder, unspecified: Secondary | ICD-10-CM | POA: Diagnosis present

## 2020-08-31 DIAGNOSIS — Z794 Long term (current) use of insulin: Secondary | ICD-10-CM

## 2020-08-31 DIAGNOSIS — E1165 Type 2 diabetes mellitus with hyperglycemia: Secondary | ICD-10-CM

## 2020-08-31 DIAGNOSIS — Z83438 Family history of other disorder of lipoprotein metabolism and other lipidemia: Secondary | ICD-10-CM

## 2020-08-31 DIAGNOSIS — J302 Other seasonal allergic rhinitis: Secondary | ICD-10-CM | POA: Diagnosis present

## 2020-08-31 DIAGNOSIS — M79606 Pain in leg, unspecified: Secondary | ICD-10-CM

## 2020-08-31 DIAGNOSIS — E1142 Type 2 diabetes mellitus with diabetic polyneuropathy: Secondary | ICD-10-CM | POA: Diagnosis present

## 2020-08-31 DIAGNOSIS — F17210 Nicotine dependence, cigarettes, uncomplicated: Secondary | ICD-10-CM | POA: Diagnosis present

## 2020-08-31 DIAGNOSIS — I7121 Aneurysm of the ascending aorta, without rupture: Secondary | ICD-10-CM | POA: Diagnosis present

## 2020-08-31 DIAGNOSIS — I152 Hypertension secondary to endocrine disorders: Secondary | ICD-10-CM | POA: Diagnosis present

## 2020-08-31 DIAGNOSIS — M1711 Unilateral primary osteoarthritis, right knee: Secondary | ICD-10-CM | POA: Diagnosis present

## 2020-08-31 DIAGNOSIS — Z7982 Long term (current) use of aspirin: Secondary | ICD-10-CM

## 2020-08-31 DIAGNOSIS — Z8673 Personal history of transient ischemic attack (TIA), and cerebral infarction without residual deficits: Secondary | ICD-10-CM

## 2020-08-31 DIAGNOSIS — I2510 Atherosclerotic heart disease of native coronary artery without angina pectoris: Secondary | ICD-10-CM | POA: Diagnosis present

## 2020-08-31 DIAGNOSIS — Z823 Family history of stroke: Secondary | ICD-10-CM

## 2020-08-31 DIAGNOSIS — I712 Thoracic aortic aneurysm, without rupture: Secondary | ICD-10-CM | POA: Diagnosis present

## 2020-08-31 DIAGNOSIS — Z96652 Presence of left artificial knee joint: Secondary | ICD-10-CM | POA: Diagnosis present

## 2020-08-31 DIAGNOSIS — M329 Systemic lupus erythematosus, unspecified: Secondary | ICD-10-CM | POA: Diagnosis present

## 2020-08-31 DIAGNOSIS — F32A Depression, unspecified: Secondary | ICD-10-CM | POA: Diagnosis present

## 2020-08-31 DIAGNOSIS — I110 Hypertensive heart disease with heart failure: Secondary | ICD-10-CM | POA: Diagnosis present

## 2020-08-31 DIAGNOSIS — T380X5A Adverse effect of glucocorticoids and synthetic analogues, initial encounter: Secondary | ICD-10-CM | POA: Diagnosis not present

## 2020-08-31 DIAGNOSIS — E785 Hyperlipidemia, unspecified: Secondary | ICD-10-CM | POA: Diagnosis present

## 2020-08-31 DIAGNOSIS — G4733 Obstructive sleep apnea (adult) (pediatric): Secondary | ICD-10-CM | POA: Diagnosis present

## 2020-08-31 DIAGNOSIS — K219 Gastro-esophageal reflux disease without esophagitis: Secondary | ICD-10-CM | POA: Diagnosis present

## 2020-08-31 DIAGNOSIS — Z9049 Acquired absence of other specified parts of digestive tract: Secondary | ICD-10-CM

## 2020-08-31 DIAGNOSIS — Z883 Allergy status to other anti-infective agents status: Secondary | ICD-10-CM

## 2020-08-31 DIAGNOSIS — J449 Chronic obstructive pulmonary disease, unspecified: Secondary | ICD-10-CM | POA: Diagnosis present

## 2020-08-31 LAB — CBC WITH DIFFERENTIAL/PLATELET
Abs Immature Granulocytes: 0.03 10*3/uL (ref 0.00–0.07)
Basophils Absolute: 0.1 10*3/uL (ref 0.0–0.1)
Basophils Relative: 1 %
Eosinophils Absolute: 0.1 10*3/uL (ref 0.0–0.5)
Eosinophils Relative: 1 %
HCT: 42.4 % (ref 36.0–46.0)
Hemoglobin: 13.3 g/dL (ref 12.0–15.0)
Immature Granulocytes: 1 %
Lymphocytes Relative: 39 %
Lymphs Abs: 2.3 10*3/uL (ref 0.7–4.0)
MCH: 27.1 pg (ref 26.0–34.0)
MCHC: 31.4 g/dL (ref 30.0–36.0)
MCV: 86.5 fL (ref 80.0–100.0)
Monocytes Absolute: 1 10*3/uL (ref 0.1–1.0)
Monocytes Relative: 16 %
Neutro Abs: 2.5 10*3/uL (ref 1.7–7.7)
Neutrophils Relative %: 42 %
Platelets: 190 10*3/uL (ref 150–400)
RBC: 4.9 MIL/uL (ref 3.87–5.11)
RDW: 15.3 % (ref 11.5–15.5)
WBC: 6 10*3/uL (ref 4.0–10.5)
nRBC: 0 % (ref 0.0–0.2)

## 2020-08-31 LAB — BASIC METABOLIC PANEL
Anion gap: 6 (ref 5–15)
BUN: 8 mg/dL (ref 6–20)
CO2: 32 mmol/L (ref 22–32)
Calcium: 8.8 mg/dL — ABNORMAL LOW (ref 8.9–10.3)
Chloride: 97 mmol/L — ABNORMAL LOW (ref 98–111)
Creatinine, Ser: 0.93 mg/dL (ref 0.44–1.00)
GFR, Estimated: >60 mL/min (ref >60)
Glucose, Bld: 247 mg/dL — ABNORMAL HIGH (ref 70–99)
Potassium: 4.3 mmol/L (ref 3.5–5.1)
Sodium: 135 mmol/L (ref 135–145)

## 2020-08-31 LAB — RESP PANEL BY RT-PCR (FLU A&B, COVID) ARPGX2
Influenza A by PCR: NEGATIVE
Influenza B by PCR: NEGATIVE
SARS Coronavirus 2 by RT PCR: POSITIVE — AB

## 2020-08-31 LAB — D-DIMER, QUANTITATIVE: D-Dimer, Quant: 1.28 ug/mL-FEU — ABNORMAL HIGH (ref 0.00–0.50)

## 2020-08-31 LAB — MAGNESIUM: Magnesium: 1.9 mg/dL (ref 1.7–2.4)

## 2020-08-31 MED ORDER — LOSARTAN POTASSIUM 25 MG PO TABS
50.0000 mg | ORAL_TABLET | Freq: Once | ORAL | Status: AC
  Administered 2020-09-01: 50 mg via ORAL
  Filled 2020-08-31: qty 2

## 2020-08-31 MED ORDER — HYDROCODONE-ACETAMINOPHEN 5-325 MG PO TABS
1.0000 | ORAL_TABLET | Freq: Once | ORAL | Status: AC
Start: 2020-08-31 — End: 2020-08-31
  Administered 2020-08-31: 1 via ORAL
  Filled 2020-08-31: qty 1

## 2020-08-31 MED ORDER — METOPROLOL SUCCINATE ER 50 MG PO TB24: 100.0000 mg | ORAL_TABLET | Freq: Every day | ORAL | Status: DC

## 2020-08-31 MED ORDER — BENZONATATE 100 MG PO CAPS
100.0000 mg | ORAL_CAPSULE | Freq: Once | ORAL | Status: AC
  Administered 2020-08-31: 100 mg via ORAL
  Filled 2020-08-31: qty 1

## 2020-08-31 MED ORDER — IOHEXOL 350 MG/ML SOLN
80.0000 mL | Freq: Once | INTRAVENOUS | Status: AC | PRN
  Administered 2020-08-31: 80 mL via INTRAVENOUS

## 2020-08-31 MED ORDER — ENOXAPARIN SODIUM 150 MG/ML IJ SOSY
138.0000 mg | PREFILLED_SYRINGE | Freq: Once | INTRAMUSCULAR | Status: AC
  Administered 2020-09-01: 138 mg via SUBCUTANEOUS
  Filled 2020-08-31: qty 0.92

## 2020-08-31 MED ORDER — CLONIDINE HCL 0.1 MG PO TABS
0.2000 mg | ORAL_TABLET | Freq: Once | ORAL | Status: AC
  Administered 2020-08-31: 0.2 mg via ORAL
  Filled 2020-08-31: qty 2

## 2020-08-31 NOTE — ED Triage Notes (Signed)
Reports bilateral leg & foot pain as well as swelling. She describes the pain as cramping. Hx DM, COPD, CHF.

## 2020-08-31 NOTE — ED Notes (Signed)
Pt sit to stand with one person assist to bedside commode

## 2020-08-31 NOTE — ED Provider Notes (Signed)
[facility] DEPT Provider Note   CSN: [id number] Arrival date & time: 08/31/20  1846     History Chief Complaint  Patient presents with  . Leg Pain    Tammy Strickland is a 60 y.o. female.  The history is provided by the patient and medical records.  Leg Pain  Tammy Strickland is a 60 y.o. female who presents to the Emergency Department complaining of leg pain and cough. She presents the emergency department complaining of two days of pain and bilateral legs, cough and low-grade fever. She states she thinks she has pneumonia. Cough is productive of yellow sputum. Pain in her legs is located in the posterior thighs, behind the knees and the dorsal feet bilaterally. No reports of recent injuries. She has a history of diabetes, CHF, COPD, remote history of DVT. She is not currently anticoagulated. No hormone use.     Past Medical History:  Diagnosis Date  . Anxiety   . Arthritis   . Asthma   . CHF (congestive heart failure) ([place])   . COPD (chronic obstructive pulmonary disease) ([place])   . Coronary artery disease   . Depression   . Diabetes mellitus without complication ([place])    type 2   . Fibromyalgia   . Gallstones   . GERD (gastroesophageal reflux disease)   . HLD (hyperlipidemia)   . Hypertension   . Lupus ([place])   . Migraines   . Neuromuscular disorder ([place])   . Osteoporosis   . Oxygen deficiency    pt uses 2.5L 02 at night,(08/25/2019)pt states she no longer uses oxygen, hasn't used in 7-8 years  . Pancreatitis   . Peripheral neuropathy   . Shingles   . Sleep apnea    had sleep study done recently ; unaware if she will be getting  a CPAP device ; patient states "im pretty sure i have it , i fall alseep all the time "  . Stroke [facility]) 10/2015    Patient Active Problem List   Diagnosis Date Noted  . Mucous retention cyst of lip 07/18/2020  . Hordeolum externum of right lower eyelid 07/18/2020  . Pica in adults 07/18/2020  . Prominent  metatarsal head of left foot 06/06/2020  . Barrett's esophagus 04/14/2020  . Right groin pain 11/07/2019  . Rib pain on right side 11/07/2019  . Upper abdominal pain 09/19/2019  . Cough 09/19/2019  . Nausea & vomiting 08/15/2019  . Pancreatitis 05/27/2019  . Severe obesity (BMI >= 40) ([place]) 05/24/2019  . TMJ arthralgia 04/26/2019  . Unilateral primary osteoarthritis, right knee 01/16/2019  . Vaginal candidiasis 11/01/2018  . Anxiety 11/01/2018  . RUQ pain 06/22/2018  . Fatty liver 06/19/2018  . Lactic acid acidosis 06/15/2018  . Transaminitis 06/15/2018  . Chronic bilateral low back pain without sciatica 05/17/2018  . Boil 05/12/2018  . It band syndrome, left 12/29/2017  . Trochanteric bursitis, left hip 12/29/2017  . History of left knee replacement 04/21/2017  . Pain in left ankle and joints of left foot 04/21/2017  . Pain in left hand 04/21/2017  . OSA (obstructive sleep apnea) 02/23/2017  . Acute gouty arthritis 12/25/2016  . Stroke ([place])   . Chronic fatigue 11/09/2016  . Hypoxia 11/09/2016  . Morbidly obese ([place]) 11/09/2016  . Rash and nonspecific skin eruption 07/21/2016  . Frequent headaches 07/21/2016  . Allergic rhinitis 07/16/2016  . Vitamin D deficiency 04/16/2016  . Hyperlipidemia 01/15/2016  . Diabetes with neurologic complications ([place]) 57/97/2820  . Chronic  diastolic congestive heart failure ([place]) 11/06/2015  . TIA (transient ischemic attack)   . Carotid-cavernous fistula   . Insomnia 10/10/2015  . Depression 10/10/2015  . Benign essential HTN 08/15/2015  . Fibromyalgia 08/15/2015  . Peripheral neuropathy 08/15/2015  . CAD (coronary artery disease) 08/15/2015  . COPD (chronic obstructive pulmonary disease) ([place]) 08/15/2015  . Osteoporosis 08/15/2015  . 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 DDD (degenerative disc disease), lumbar 08/15/2015  . Occupational exposure to industrial toxins 08/15/2015  . GERD (gastroesophageal reflux disease) 08/15/2015    Past  Surgical History:  Procedure Laterality Date  . ABDOMINAL HYSTERECTOMY     ovaries left  . APPENDECTOMY    . BREAST LUMPECTOMY WITH RADIOACTIVE SEED LOCALIZATION Left 06/14/2019   Procedure: LEFT BREAST LUMPECTOMY WITH RADIOACTIVE SEED LOCALIZATION X 2;  Surgeon: Coralie Keens, MD;  Location: [facility];  Service: General;  Laterality: Left;  LMA  . CHOLECYSTECTOMY    . IR GENERIC HISTORICAL  11/07/2015   IR ANGIO INTRA EXTRACRAN SEL COM CAROTID INNOMINATE BILAT MOD SED 11/07/2015 Luanne Bras, MD [facility]  . IR GENERIC HISTORICAL  11/07/2015   IR ANGIO VERTEBRAL SEL SUBCLAVIAN INNOMINATE UNI R MOD SED 11/07/2015 Luanne Bras, MD [facility]  . IR GENERIC HISTORICAL  11/07/2015   IR ANGIO VERTEBRAL SEL VERTEBRAL UNI L MOD SED 11/07/2015 Luanne Bras, MD [facility]  . IR GENERIC HISTORICAL  11/07/2015   IR ANGIOGRAM EXTREMITY LEFT 11/07/2015 Luanne Bras, MD [facility]  . TONSILLECTOMY    . TOTAL KNEE ARTHROPLASTY Left 03/12/2017   Procedure: LEFT TOTAL KNEE ARTHROPLASTY;  Surgeon: Mcarthur Rossetti, MD;  Location: [facility];  Service: Orthopedics;  Laterality: Left;  Adductor Block     OB History    Gravida  3   Para  3   Term  3   Preterm      AB      Living  3     SAB      IAB      Ectopic      Multiple      Live Births  3           Family History  Problem Relation Age of Onset  . Hyperlipidemia Mother   . Hypertension Mother   . Stroke Mother   . Thyroid disease Mother   . Heart attack Mother   . Hyperlipidemia Father   . Hypertension Father   . Stroke Father   . Heart attack Father   . Hypertension Sister   . Stroke Sister   . Thyroid disease Sister   . Breast cancer Sister   . Crohn's disease Sister   . Hypertension Sister   . Hypertension Brother   . Diabetes Brother   . Hypertension Brother     Social History   Tobacco Use  . Smoking status: Current Every Day Smoker    Packs/day: 0.25    Years: 35.00    Pack  years: 8.75    Types: Cigarettes  . Smokeless tobacco: Never Used  . Tobacco comment: referred  to smoking  cessation  classes. at  [facility] Use  . Vaping Use: Never used  Substance Use Topics  . Alcohol use: Yes    Comment: occasional  . Drug use: No    Home Medications Prior to Admission medications   Medication Sig Start Date End Date Taking? Authorizing Provider  ACCU-CHEK GUIDE test strip TEST FOUR TIMES DAILY Patient taking differently: 1 each  by Other route 4 (four) times daily. E11.65 10/24/19   Binnie Rail, MD  Accu-Chek Softclix Lancets lancets 1 each by Other route 4 (four) times daily. E11.65    [provider]  amitriptyline (ELAVIL) 75 MG tablet Take 1 tablet by mouth at bedtime 06/05/20   Binnie Rail, MD  aspirin EC 81 MG tablet Take 1 tablet (81 mg total) by mouth daily. 11/01/15   Elgergawy, Silver Huguenin, MD  atorvastatin (LIPITOR) 20 MG tablet Take 1 tablet by mouth every day 01/12/20   Binnie Rail, MD  Blood Glucose Monitoring Suppl (ACCU-CHEK GUIDE) w/Device KIT 1 each by Does not apply route 4 (four) times daily. E11.65    [provider]  budesonide-formoterol (SYMBICORT) 80-4.5 MCG/ACT inhaler Inhale 2 puffs into the lungs as needed (wheezing/SOB). 01/12/20   Binnie Rail, MD  cloNIDine (CATAPRES) 0.3 MG tablet Take 1 tablet by mouth every day 06/05/20   Binnie Rail, MD  erythromycin ophthalmic ointment Place 1 application into the right eye at bedtime. 07/18/20   Binnie Rail, MD  fluconazole (DIFLUCAN) 150 MG tablet Take one tablet every 72 hrs 07/18/20   Binnie Rail, MD  fluticasone (FLONASE) 50 MCG/ACT nasal spray Shake liquid & use 1 spray into each nostril every day as needed for allergies 06/04/20   Binnie Rail, MD  furosemide (LASIX) 80 MG tablet Take 1 tablet by mouth every day 06/04/20   Binnie Rail, MD  gabapentin (NEURONTIN) 600 MG tablet Take 2 tablets by mouth 3 times a day, need appt 06/04/20   Binnie Rail, MD   hydrOXYzine (ATARAX/VISTARIL) 10 MG tablet SMARTSIG:1 Tablet(s) By Mouth Every 12 Hours 07/02/20   [provider]  hydrOXYzine (ATARAX/VISTARIL) 25 MG tablet Take 25-50 mg by mouth at bedtime as needed. 05/07/20   [provider]  insulin glargine, 2 Unit Dial, (TOUJEO MAX SOLOSTAR) 300 UNIT/ML Solostar Pen Inject 170 Units into the skin every morning. And pen needles 1/day 05/31/20   Renato Shin, MD  Insulin Pen Needle (PEN NEEDLES) 32G X 5 MM MISC UAD daily for insulin 01/17/20   Burns, Claudina Lick, MD  ketorolac (TORADOL) 10 MG tablet Take 10 mg by mouth every 8 (eight) hours. 05/07/20   [provider]  losartan (COZAAR) 50 MG tablet Take 1 tablet by mouth twice daily 06/04/20   Binnie Rail, MD  meloxicam (MOBIC) 15 MG tablet Take 15 mg by mouth daily. 02/09/20   [provider]  methocarbamol (ROBAXIN) 500 MG tablet Take 1 tablet (500 mg total) by mouth 4 (four) times daily. 05/03/20   Binnie Rail, MD  metoprolol (TOPROL-XL) 200 MG 24 hr tablet Take 1 tablet by mouth twice daily 06/04/20   Binnie Rail, MD  [facility] 4 MG/0.1ML LIQD nasal spray kit SMARTSIG:1 Spray(s) Both Nares Once PRN 11/17/19   [provider]  nitroGLYCERIN (NITROSTAT) 0.4 MG SL tablet Place 0.4 mg under the tongue every 5 (five) minutes as needed for chest pain.    [provider]  Nutritional Supplements (GLUCERNA HUNGER SMART SHAKE) LIQD Take 1 each by mouth 2 (two) times daily. 05/31/20   Renato Shin, MD  oxyCODONE-acetaminophen (PERCOCET) 10-325 MG tablet Take 1 tablet by mouth every 4 (four) hours as needed. 10/19/19   [provider]  OZEMPIC, 1 MG/DOSE, 4 MG/3ML SOPN Inject into the skin. 07/10/20   [provider]  pantoprazole (PROTONIX) 40 MG tablet Take one tablet by  mouth twice daily (am and pm) 01/24/20   Burns, Claudina Lick, MD  potassium chloride SA (KLOR-CON) 20 MEQ tablet Take 1 tablet by mouth every day 06/04/20   Binnie Rail, MD  tiZANidine  (ZANAFLEX) 4 MG tablet Take 4 mg by mouth at bedtime. 05/16/20   [provider]  TOUJEO SOLOSTAR 300 UNIT/ML Solostar Pen Inject into the skin. 07/15/20   [provider]  ZTLIDO 1.8 % PTCH SMARTSIG:1 Patch(s) Topical 3 Times Daily 06/05/20   [provider]    Allergies    Metformin and related, Other, Ozempic (0.25 or 0.5 mg-dose) [semaglutide(0.25 or 0.93m-dos)], and Bacitracin  Review of Systems   Review of Systems  All other systems reviewed and are negative.   Physical Exam Updated Vital Signs BP (!) 169/101   Pulse 60   Temp (!) 100.8 F (38.2 C) (Oral)   Resp (!) 27   SpO2 93%   Physical Exam Vitals and nursing note reviewed.  Constitutional:      Appearance: She is well-developed.  HENT:     Head: Normocephalic and atraumatic.  Cardiovascular:     Rate and Rhythm: Normal rate and regular rhythm.     Heart sounds: No murmur heard.   Pulmonary:     Effort: Pulmonary effort is normal. No respiratory distress.     Breath sounds: Normal breath sounds.  Abdominal:     Palpations: Abdomen is soft.     Tenderness: There is no abdominal tenderness. There is no guarding or rebound.  Musculoskeletal:     Comments: 2+ DP pulses bilaterally. Trace edema to the right lower extremity. No pinpoint tenderness to palpation throughout bilateral lower extremities.  Skin:    General: Skin is warm and dry.  Neurological:     Mental Status: She is alert and oriented to person, place, and time.     Comments: 5/5 strength in all four extremities.    Psychiatric:        Behavior: Behavior normal.     ED Results / Procedures / Treatments   Labs (all labs ordered are listed, but only abnormal results are displayed) Labs Reviewed  RESP PANEL BY RT-PCR (FLU A&B, COVID) ARPGX2 - Abnormal; Notable for the following components:      Result Value   SARS Coronavirus 2 by RT PCR POSITIVE (*)    All other components within normal limits  BASIC METABOLIC PANEL -  Abnormal; Notable for the following components:   Chloride 97 (*)    Glucose, Bld 247 (*)    Calcium 8.8 (*)    All other components within normal limits  D-DIMER, QUANTITATIVE - Abnormal; Notable for the following components:   D-Dimer, Quant 1.28 (*)    All other components within normal limits  CBC WITH DIFFERENTIAL/PLATELET  MAGNESIUM    EKG None  Radiology DG Chest Port 1 View  Result Date: 08/31/2020 CLINICAL DATA:  Fever, cough. EXAM: PORTABLE CHEST 1 VIEW COMPARISON:  April 18, 2020. FINDINGS: The heart size and mediastinal contours are within normal limits. Both lungs are clear. The visualized skeletal structures are unremarkable. IMPRESSION: No active disease. Electronically Signed   By: JMarijo ConceptionM.D.   On: 08/31/2020 21:10    Procedures Procedures   Medications Ordered in ED Medications  losartan (COZAAR) tablet 50 mg (has no administration in time range)  metoprolol succinate (TOPROL-XL) 24 hr tablet 100 mg (has no administration in time range)  enoxaparin (LOVENOX) injection 138 mg (has no administration  in time range)  HYDROcodone-acetaminophen (NORCO/VICODIN) 5-325 MG per tablet 1 tablet (1 tablet Oral Given 08/31/20 2117)  benzonatate (TESSALON) capsule 100 mg (100 mg Oral Given 08/31/20 2117)  cloNIDine (CATAPRES) tablet 0.2 mg (0.2 mg Oral Given 08/31/20 2247)  iohexol (OMNIPAQUE) 350 MG/ML injection 80 mL (80 mLs Intravenous Contrast Given 08/31/20 2338)    ED Course  I have reviewed the triage vital signs and the nursing notes.  Pertinent labs & imaging results that were available during my care of the patient were reviewed by me and considered in my medical decision making (see chart for details).    MDM Rules/Calculators/A&P                         Pt with hx/o CHF, COPD, DVT not anticoagulated here for fever, cough, sob and leg pain. She is uncomfortable but not toxic appearing on exam.  She is positive for COVID 19.  Concern for DVT/PE.  Will  obtain CTA to further evaluate.  Pt care transferred pending CTA.    Final Clinical Impression(s) / ED Diagnoses Final diagnoses:  None    Rx / DC Orders ED Discharge Orders    None       Quintella Reichert, MD 09/01/20 0009

## 2020-08-31 NOTE — ED Provider Notes (Signed)
Emergency Medicine Provider Triage Evaluation Note  Tammy Strickland , a 60 y.o. female  was evaluated in triage.  Pt complains of bilateral leg pain to posterior thighs and dorsum of feet/ankles. Onset yesterday.  Took a spoonful of mustard which helped with her cramping.  Patient reports compliance with her medications including her furosemide, has not increased use, states she takes 1 daily.  Review of Systems  Positive: Leg pain, cramping Negative: Injuries  Physical Exam  BP (!) 184/116 (BP Location: Right Arm)   Pulse 64   Temp (!) 100.8 F (38.2 C) (Oral)   Resp 18   SpO2 98%  Gen:   Awake, no distress   Resp:  Normal effort  MSK:   Moves extremities without difficulty  Other:  DP pulses present, trace edema to bilateral lower extremities.  No calf tenderness.  Medical Decision Making  Medically screening exam initiated at 6:58 PM.  Appropriate orders placed.  Hortencia Martire was informed that the remainder of the evaluation will be completed by another provider, this initial triage assessment does not replace that evaluation, and the importance of remaining in the ED until their evaluation is complete.     Tacy Learn, PA-C 08/31/20 1900    Charlesetta Shanks, MD 09/17/20 1048

## 2020-09-01 ENCOUNTER — Encounter (HOSPITAL_COMMUNITY): Payer: Self-pay | Admitting: Internal Medicine

## 2020-09-01 DIAGNOSIS — E785 Hyperlipidemia, unspecified: Secondary | ICD-10-CM | POA: Diagnosis present

## 2020-09-01 DIAGNOSIS — E1142 Type 2 diabetes mellitus with diabetic polyneuropathy: Secondary | ICD-10-CM | POA: Diagnosis present

## 2020-09-01 DIAGNOSIS — U071 COVID-19: Secondary | ICD-10-CM | POA: Diagnosis present

## 2020-09-01 DIAGNOSIS — M797 Fibromyalgia: Secondary | ICD-10-CM | POA: Diagnosis present

## 2020-09-01 DIAGNOSIS — I5032 Chronic diastolic (congestive) heart failure: Secondary | ICD-10-CM

## 2020-09-01 DIAGNOSIS — J449 Chronic obstructive pulmonary disease, unspecified: Secondary | ICD-10-CM | POA: Diagnosis present

## 2020-09-01 DIAGNOSIS — K760 Fatty (change of) liver, not elsewhere classified: Secondary | ICD-10-CM | POA: Diagnosis present

## 2020-09-01 DIAGNOSIS — G4733 Obstructive sleep apnea (adult) (pediatric): Secondary | ICD-10-CM | POA: Diagnosis present

## 2020-09-01 DIAGNOSIS — I10 Essential (primary) hypertension: Secondary | ICD-10-CM | POA: Diagnosis not present

## 2020-09-01 DIAGNOSIS — F17210 Nicotine dependence, cigarettes, uncomplicated: Secondary | ICD-10-CM | POA: Diagnosis present

## 2020-09-01 DIAGNOSIS — I712 Thoracic aortic aneurysm, without rupture: Secondary | ICD-10-CM | POA: Diagnosis present

## 2020-09-01 DIAGNOSIS — M79652 Pain in left thigh: Secondary | ICD-10-CM | POA: Diagnosis not present

## 2020-09-01 DIAGNOSIS — M1711 Unilateral primary osteoarthritis, right knee: Secondary | ICD-10-CM | POA: Diagnosis present

## 2020-09-01 DIAGNOSIS — J9601 Acute respiratory failure with hypoxia: Secondary | ICD-10-CM | POA: Diagnosis present

## 2020-09-01 DIAGNOSIS — I110 Hypertensive heart disease with heart failure: Secondary | ICD-10-CM | POA: Diagnosis present

## 2020-09-01 DIAGNOSIS — J302 Other seasonal allergic rhinitis: Secondary | ICD-10-CM | POA: Diagnosis present

## 2020-09-01 DIAGNOSIS — M329 Systemic lupus erythematosus, unspecified: Secondary | ICD-10-CM | POA: Diagnosis present

## 2020-09-01 DIAGNOSIS — M79606 Pain in leg, unspecified: Secondary | ICD-10-CM | POA: Diagnosis present

## 2020-09-01 DIAGNOSIS — Z833 Family history of diabetes mellitus: Secondary | ICD-10-CM | POA: Diagnosis not present

## 2020-09-01 DIAGNOSIS — M79651 Pain in right thigh: Secondary | ICD-10-CM | POA: Diagnosis not present

## 2020-09-01 DIAGNOSIS — F419 Anxiety disorder, unspecified: Secondary | ICD-10-CM | POA: Diagnosis present

## 2020-09-01 DIAGNOSIS — E1165 Type 2 diabetes mellitus with hyperglycemia: Secondary | ICD-10-CM

## 2020-09-01 DIAGNOSIS — F32A Depression, unspecified: Secondary | ICD-10-CM | POA: Diagnosis present

## 2020-09-01 DIAGNOSIS — Z794 Long term (current) use of insulin: Secondary | ICD-10-CM

## 2020-09-01 DIAGNOSIS — K219 Gastro-esophageal reflux disease without esophagitis: Secondary | ICD-10-CM

## 2020-09-01 DIAGNOSIS — I7121 Aneurysm of the ascending aorta, without rupture: Secondary | ICD-10-CM | POA: Diagnosis present

## 2020-09-01 DIAGNOSIS — Z6841 Body Mass Index (BMI) 40.0 and over, adult: Secondary | ICD-10-CM | POA: Diagnosis not present

## 2020-09-01 DIAGNOSIS — I2510 Atherosclerotic heart disease of native coronary artery without angina pectoris: Secondary | ICD-10-CM | POA: Diagnosis present

## 2020-09-01 DIAGNOSIS — M810 Age-related osteoporosis without current pathological fracture: Secondary | ICD-10-CM | POA: Diagnosis present

## 2020-09-01 LAB — GLUCOSE, CAPILLARY
Glucose-Capillary: 348 mg/dL — ABNORMAL HIGH (ref 70–99)
Glucose-Capillary: 421 mg/dL — ABNORMAL HIGH (ref 70–99)

## 2020-09-01 LAB — CBG MONITORING, ED
Glucose-Capillary: 400 mg/dL — ABNORMAL HIGH (ref 70–99)
Glucose-Capillary: 386 mg/dL — ABNORMAL HIGH (ref 70–99)
Glucose-Capillary: 376 mg/dL — ABNORMAL HIGH (ref 70–99)
Glucose-Capillary: 331 mg/dL — ABNORMAL HIGH (ref 70–99)

## 2020-09-01 LAB — C-REACTIVE PROTEIN: CRP: 1.2 mg/dL — ABNORMAL HIGH (ref <1.0)

## 2020-09-01 LAB — HIV ANTIBODY (ROUTINE TESTING W REFLEX): HIV Screen 4th Generation wRfx: NONREACTIVE

## 2020-09-01 LAB — PROCALCITONIN: Procalcitonin: <0.1 ng/mL

## 2020-09-01 LAB — FIBRINOGEN: Fibrinogen: 472 mg/dL (ref 210–475)

## 2020-09-01 LAB — LACTATE DEHYDROGENASE: LDH: 307 U/L — ABNORMAL HIGH (ref 98–192)

## 2020-09-01 LAB — GLUCOSE, RANDOM: Glucose, Bld: 421 mg/dL — ABNORMAL HIGH (ref 70–99)

## 2020-09-01 LAB — HEMOGLOBIN A1C
Hgb A1c MFr Bld: 9.7 % — ABNORMAL HIGH (ref 4.8–5.6)
Mean Plasma Glucose: 231.69 mg/dL

## 2020-09-01 LAB — TRIGLYCERIDES: Triglycerides: 54 mg/dL (ref <150)

## 2020-09-01 LAB — FERRITIN: Ferritin: 79 ng/mL (ref 11–307)

## 2020-09-01 MED ORDER — ENOXAPARIN SODIUM 150 MG/ML IJ SOSY
138.0000 mg | PREFILLED_SYRINGE | Freq: Two times a day (BID) | INTRAMUSCULAR | Status: DC
  Administered 2020-09-01 – 2020-09-02 (×3): 138 mg via SUBCUTANEOUS
  Filled 2020-09-01 (×4): qty 0.92

## 2020-09-01 MED ORDER — NITROGLYCERIN 0.4 MG SL SUBL: 0.4000 mg | SUBLINGUAL_TABLET | SUBLINGUAL | Status: DC | PRN

## 2020-09-01 MED ORDER — SODIUM CHLORIDE 0.9 % IV SOLN
100.0000 mg | Freq: Once | INTRAVENOUS | Status: AC
  Administered 2020-09-01: 100 mg via INTRAVENOUS
  Filled 2020-09-01: qty 20

## 2020-09-01 MED ORDER — ASCORBIC ACID 500 MG PO TABS
500.0000 mg | ORAL_TABLET | Freq: Every day | ORAL | Status: DC
  Administered 2020-09-01 – 2020-09-03 (×3): 500 mg via ORAL
  Filled 2020-09-01 (×3): qty 1

## 2020-09-01 MED ORDER — METOPROLOL SUCCINATE ER 100 MG PO TB24
200.0000 mg | ORAL_TABLET | Freq: Two times a day (BID) | ORAL | Status: DC
  Administered 2020-09-01 – 2020-09-02 (×4): 200 mg via ORAL
  Filled 2020-09-01: qty 2
  Filled 2020-09-01: qty 4
  Filled 2020-09-01 (×2): qty 2

## 2020-09-01 MED ORDER — ACETAMINOPHEN 325 MG PO TABS: 650.0000 mg | ORAL_TABLET | Freq: Four times a day (QID) | ORAL | Status: DC | PRN

## 2020-09-01 MED ORDER — LABETALOL HCL 5 MG/ML IV SOLN: 10.0000 mg | INTRAVENOUS | Status: DC | PRN

## 2020-09-01 MED ORDER — AMITRIPTYLINE HCL 25 MG PO TABS
75.0000 mg | ORAL_TABLET | Freq: Every day | ORAL | Status: DC
  Administered 2020-09-01 – 2020-09-02 (×2): 75 mg via ORAL
  Filled 2020-09-01 (×2): qty 3

## 2020-09-01 MED ORDER — GABAPENTIN 300 MG PO CAPS
600.0000 mg | ORAL_CAPSULE | Freq: Three times a day (TID) | ORAL | Status: DC
  Administered 2020-09-01 – 2020-09-03 (×7): 600 mg via ORAL
  Filled 2020-09-01 (×7): qty 2

## 2020-09-01 MED ORDER — PREDNISONE 50 MG PO TABS: 50.0000 mg | ORAL_TABLET | Freq: Every day | ORAL | Status: DC

## 2020-09-01 MED ORDER — PANTOPRAZOLE SODIUM 40 MG PO TBEC
40.0000 mg | DELAYED_RELEASE_TABLET | Freq: Two times a day (BID) | ORAL | Status: DC
  Administered 2020-09-01 – 2020-09-03 (×5): 40 mg via ORAL
  Filled 2020-09-01 (×5): qty 1

## 2020-09-01 MED ORDER — METHOCARBAMOL 500 MG PO TABS
500.0000 mg | ORAL_TABLET | Freq: Four times a day (QID) | ORAL | Status: DC
  Administered 2020-09-01 – 2020-09-03 (×10): 500 mg via ORAL
  Filled 2020-09-01 (×10): qty 1

## 2020-09-01 MED ORDER — ACETAMINOPHEN 325 MG PO TABS
650.0000 mg | ORAL_TABLET | ORAL | Status: DC | PRN
  Administered 2020-09-01: 650 mg via ORAL
  Filled 2020-09-01: qty 2

## 2020-09-01 MED ORDER — ONDANSETRON HCL 4 MG/2ML IJ SOLN: 4.0000 mg | Freq: Four times a day (QID) | INTRAMUSCULAR | Status: DC | PRN

## 2020-09-01 MED ORDER — FLUTICASONE PROPIONATE 50 MCG/ACT NA SUSP
1.0000 | Freq: Every day | NASAL | Status: DC
  Administered 2020-09-02 – 2020-09-03 (×2): 1 via NASAL
  Filled 2020-09-01 (×2): qty 16

## 2020-09-01 MED ORDER — FLUTICASONE FUROATE-VILANTEROL 100-25 MCG/INH IN AEPB
1.0000 | INHALATION_SPRAY | Freq: Every day | RESPIRATORY_TRACT | Status: DC
  Administered 2020-09-02 – 2020-09-03 (×2): 1 via RESPIRATORY_TRACT
  Filled 2020-09-01 (×2): qty 28

## 2020-09-01 MED ORDER — INSULIN GLARGINE 100 UNIT/ML ~~LOC~~ SOLN
100.0000 [IU] | Freq: Every day | SUBCUTANEOUS | Status: DC
  Administered 2020-09-01: 100 [IU] via SUBCUTANEOUS
  Filled 2020-09-01 (×3): qty 1

## 2020-09-01 MED ORDER — CLONIDINE HCL 0.3 MG PO TABS
0.3000 mg | ORAL_TABLET | Freq: Every day | ORAL | Status: DC
  Administered 2020-09-01 – 2020-09-03 (×3): 0.3 mg via ORAL
  Filled 2020-09-01 (×2): qty 1
  Filled 2020-09-01: qty 3

## 2020-09-01 MED ORDER — ATORVASTATIN CALCIUM 20 MG PO TABS
20.0000 mg | ORAL_TABLET | Freq: Every day | ORAL | Status: DC
  Administered 2020-09-01 – 2020-09-03 (×3): 20 mg via ORAL
  Filled 2020-09-01: qty 2
  Filled 2020-09-01 (×2): qty 1

## 2020-09-01 MED ORDER — FUROSEMIDE 40 MG PO TABS
80.0000 mg | ORAL_TABLET | Freq: Every day | ORAL | Status: DC
  Administered 2020-09-01 – 2020-09-03 (×3): 80 mg via ORAL
  Filled 2020-09-01 (×3): qty 2

## 2020-09-01 MED ORDER — GUAIFENESIN-DM 100-10 MG/5ML PO SYRP
10.0000 mL | ORAL_SOLUTION | ORAL | Status: DC | PRN
  Administered 2020-09-01 – 2020-09-03 (×3): 10 mL via ORAL
  Filled 2020-09-01 (×3): qty 10

## 2020-09-01 MED ORDER — SODIUM CHLORIDE 0.9 % IV SOLN
100.0000 mg | Freq: Every day | INTRAVENOUS | Status: DC
  Administered 2020-09-02 – 2020-09-03 (×2): 100 mg via INTRAVENOUS
  Filled 2020-09-01 (×2): qty 20

## 2020-09-01 MED ORDER — LOSARTAN POTASSIUM 50 MG PO TABS
50.0000 mg | ORAL_TABLET | Freq: Two times a day (BID) | ORAL | Status: DC
  Administered 2020-09-01 – 2020-09-03 (×4): 50 mg via ORAL
  Filled 2020-09-01 (×2): qty 1
  Filled 2020-09-01: qty 2
  Filled 2020-09-01 (×2): qty 1

## 2020-09-01 MED ORDER — ONDANSETRON HCL 4 MG PO TABS: 4.0000 mg | ORAL_TABLET | Freq: Four times a day (QID) | ORAL | Status: DC | PRN

## 2020-09-01 MED ORDER — HYDROXYZINE HCL 25 MG PO TABS
25.0000 mg | ORAL_TABLET | Freq: Every evening | ORAL | Status: DC | PRN
  Administered 2020-09-01 – 2020-09-02 (×2): 25 mg via ORAL
  Filled 2020-09-01 (×2): qty 1

## 2020-09-01 MED ORDER — INSULIN ASPART 100 UNIT/ML IJ SOLN
20.0000 [IU] | Freq: Three times a day (TID) | INTRAMUSCULAR | Status: DC
  Administered 2020-09-01 – 2020-09-02 (×5): 20 [IU] via SUBCUTANEOUS
  Filled 2020-09-01: qty 0.2

## 2020-09-01 MED ORDER — ALBUTEROL SULFATE HFA 108 (90 BASE) MCG/ACT IN AERS
2.0000 | INHALATION_SPRAY | RESPIRATORY_TRACT | Status: DC | PRN
  Administered 2020-09-01 (×2): 2 via RESPIRATORY_TRACT
  Filled 2020-09-01: qty 6.7

## 2020-09-01 MED ORDER — DEXAMETHASONE SODIUM PHOSPHATE 10 MG/ML IJ SOLN
6.0000 mg | Freq: Once | INTRAMUSCULAR | Status: AC
  Administered 2020-09-01: 6 mg via INTRAVENOUS
  Filled 2020-09-01: qty 1

## 2020-09-01 MED ORDER — INSULIN ASPART 100 UNIT/ML IJ SOLN
22.0000 [IU] | Freq: Once | INTRAMUSCULAR | Status: AC
  Administered 2020-09-01: 22 [IU] via SUBCUTANEOUS

## 2020-09-01 MED ORDER — ASPIRIN EC 81 MG PO TBEC
81.0000 mg | DELAYED_RELEASE_TABLET | Freq: Every day | ORAL | Status: DC
  Administered 2020-09-01 – 2020-09-03 (×3): 81 mg via ORAL
  Filled 2020-09-01 (×3): qty 1

## 2020-09-01 MED ORDER — INSULIN ASPART 100 UNIT/ML IJ SOLN
0.0000 [IU] | Freq: Three times a day (TID) | INTRAMUSCULAR | Status: DC
  Administered 2020-09-01 (×2): 20 [IU] via SUBCUTANEOUS
  Administered 2020-09-01 – 2020-09-02 (×2): 15 [IU] via SUBCUTANEOUS
  Administered 2020-09-02: 20 [IU] via SUBCUTANEOUS
  Administered 2020-09-02: 11 [IU] via SUBCUTANEOUS
  Administered 2020-09-02: 20 [IU] via SUBCUTANEOUS
  Administered 2020-09-03: 7 [IU] via SUBCUTANEOUS
  Administered 2020-09-03: 4 [IU] via SUBCUTANEOUS
  Filled 2020-09-01: qty 0.2

## 2020-09-01 MED ORDER — METHYLPREDNISOLONE SODIUM SUCC 125 MG IJ SOLR
70.0000 mg | Freq: Two times a day (BID) | INTRAMUSCULAR | Status: DC
  Administered 2020-09-01 – 2020-09-02 (×3): 70 mg via INTRAVENOUS
  Filled 2020-09-01 (×3): qty 2

## 2020-09-01 MED ORDER — OXYCODONE-ACETAMINOPHEN 5-325 MG PO TABS
2.0000 | ORAL_TABLET | Freq: Four times a day (QID) | ORAL | Status: DC | PRN
  Administered 2020-09-01 – 2020-09-02 (×4): 2 via ORAL
  Filled 2020-09-01 (×4): qty 2

## 2020-09-01 MED ORDER — POLYETHYLENE GLYCOL 3350 17 G PO PACK: 17.0000 g | PACK | Freq: Every day | ORAL | Status: DC | PRN

## 2020-09-01 MED ORDER — ZINC SULFATE 220 (50 ZN) MG PO CAPS
220.0000 mg | ORAL_CAPSULE | Freq: Every day | ORAL | Status: DC
  Administered 2020-09-01 – 2020-09-03 (×3): 220 mg via ORAL
  Filled 2020-09-01 (×3): qty 1

## 2020-09-01 MED ORDER — ENOXAPARIN SODIUM 80 MG/0.8ML IJ SOSY: 70.0000 mg | PREFILLED_SYRINGE | INTRAMUSCULAR | Status: DC

## 2020-09-01 NOTE — Plan of Care (Signed)
  Problem: Education: Goal: Knowledge of General Education information will improve Description: Including pain rating scale, medication(s)/side effects and non-pharmacologic comfort measures Outcome: Progressing   Problem: Education: Goal: Knowledge of risk factors and measures for prevention of condition will improve Outcome: Progressing   Problem: Respiratory: Goal: Will maintain a patent airway Outcome: Progressing Goal: Complications related to the disease process, condition or treatment will be avoided or minimized Outcome: Progressing

## 2020-09-01 NOTE — ED Notes (Signed)
Bedside report given to Tommy Rainwater, RN

## 2020-09-01 NOTE — ED Notes (Signed)
Pt placed on 2L [place] for slight O2 desat while sleeping

## 2020-09-01 NOTE — H&P (Addendum)
History and Physical    Tammy Strickland HAF:[id number] DOB: [birth year] DOA: 08/31/2020  PCP: Binnie Rail, MD  Patient coming from: Home   Chief Complaint:  Chief Complaint  Patient presents with  . Leg Pain     HPI:    60 year old female with past medical history of diabetes mellitus type 2, coronary artery disease, hypertension, COPD, obstructive sleep apnea, diabetic polyneuropathy, diastolic congestive heart failure (Echo 10/2015 EF 55-60% with G1DD), hyperlipidemia and nicotine dependence who presents to [facility] emergency department with complaints of cough, congestion and leg pain.  Patient explains that approximately 3 days ago she began to develop pain in her posterior right thigh.  This pain is severe in intensity, "tight" in quality.  Radiates into the posterior right knee.  Pain is worse with movement of the affected extremity or weightbearing.    Over this 3-day span of time patient also experienced shortness of breath.  The shortness of breath was moderate to severe in intensity, worse with exertion and improved with rest.  Shortness of breath is associated with cough intermittently productive with yellow sputum.  Patient also complains of associated myalgias and intermittent fevers.  Patient denies any changes in appetite or sick contacts or recent travel.  Due to this progressive constellation of symptoms the patient eventually presented to [facility] emergency department for evaluation.  Upon evaluation in the emergency department patient was found to be COVID-positive.  Patient was initially saturating at 98% on arrival to the emergency department but throughout her emergency department stay began to exhibit hypoxia and therefore was placed on submental oxygen via nasal cannula.  D-dimer was found to be elevated and therefore CT angiogram of the chest was performed which was negative for pulmonary embolism but did find a 4.4 cm ascending thoracic  aortic aneurysm without dissection.  The hospitalist group was then called to assess the patient for admission to the hospital.  Review of Systems:   Review of Systems  Constitutional: Positive for fever and malaise/fatigue.  Respiratory: Positive for cough, sputum production and shortness of breath.   Musculoskeletal: Positive for myalgias.  Neurological: Positive for weakness.  All other systems reviewed and are negative.   Past Medical History:  Diagnosis Date  . Anxiety   . Arthritis   . Asthma   . CHF (congestive heart failure) ([place])   . COPD (chronic obstructive pulmonary disease) ([place])   . Coronary artery disease   . Depression   . Diabetes mellitus without complication ([place])    type 2   . Fibromyalgia   . Gallstones   . GERD (gastroesophageal reflux disease)   . HLD (hyperlipidemia)   . Hypertension   . Lupus ([place])   . Migraines   . Neuromuscular disorder ([place])   . Osteoporosis   . Oxygen deficiency    pt uses 2.5L 02 at night,(08/25/2019)pt states she no longer uses oxygen, hasn't used in 7-8 years  . Pancreatitis   . Peripheral neuropathy   . Shingles   . Sleep apnea    had sleep study done recently ; unaware if she will be getting  a CPAP device ; patient states "im pretty sure i have it , i fall alseep all the time "  . Stroke [facility]) 10/2015    Past Surgical History:  Procedure Laterality Date  . ABDOMINAL HYSTERECTOMY     ovaries left  . APPENDECTOMY    . BREAST LUMPECTOMY WITH RADIOACTIVE SEED LOCALIZATION Left 06/14/2019   Procedure:  LEFT BREAST LUMPECTOMY WITH RADIOACTIVE SEED LOCALIZATION X 2;  Surgeon: Coralie Keens, MD;  Location: [place];  Service: General;  Laterality: Left;  LMA  . CHOLECYSTECTOMY    . IR GENERIC HISTORICAL  11/07/2015   IR ANGIO INTRA EXTRACRAN SEL COM CAROTID INNOMINATE BILAT MOD SED 11/07/2015 Luanne Bras, MD [facility]INTER[facility]  . IR GENERIC HISTORICAL  11/07/2015   IR ANGIO VERTEBRAL SEL SUBCLAVIAN INNOMINATE UNI R MOD SED  11/07/2015 Luanne Bras, MD [facility]  . IR GENERIC HISTORICAL  11/07/2015   IR ANGIO VERTEBRAL SEL VERTEBRAL UNI L MOD SED 11/07/2015 Luanne Bras, MD [facility]  . IR GENERIC HISTORICAL  11/07/2015   IR ANGIOGRAM EXTREMITY LEFT 11/07/2015 Luanne Bras, MD [facility]  . TONSILLECTOMY    . TOTAL KNEE ARTHROPLASTY Left 03/12/2017   Procedure: LEFT TOTAL KNEE ARTHROPLASTY;  Surgeon: Mcarthur Rossetti, MD;  Location: [facility];  Service: Orthopedics;  Laterality: Left;  Adductor Block     reports that she has been smoking cigarettes. She has a 8.75 pack-year smoking history. She has never used smokeless tobacco. She reports current alcohol use. She reports that she does not use drugs.  Allergies  Allergen Reactions  . Metformin And Related Other (See Comments)    Lactic acidosis  . Other Other (See Comments)    H/o pancreatitis  . Ozempic (0.25 Or 0.5 Mg-Dose) [Semaglutide(0.25 Or 0.69m-Dos)]     H/o pancreatitis  . Bacitracin Hives    Family History  Problem Relation Age of Onset  . Hyperlipidemia Mother   . Hypertension Mother   . Stroke Mother   . Thyroid disease Mother   . Heart attack Mother   . Hyperlipidemia Father   . Hypertension Father   . Stroke Father   . Heart attack Father   . Hypertension Sister   . Stroke Sister   . Thyroid disease Sister   . Breast cancer Sister   . Crohn's disease Sister   . Hypertension Sister   . Hypertension Brother   . Diabetes Brother   . Hypertension Brother      Prior to Admission medications   Medication Sig Start Date End Date Taking? Authorizing Provider  ACCU-CHEK GUIDE test strip TEST FOUR TIMES DAILY Patient taking differently: 1 each by Other route 4 (four) times daily. E11.65 10/24/19   BBinnie Rail MD  Accu-Chek Softclix Lancets lancets 1 each by Other route 4 (four) times daily. E11.65    [provider]  amitriptyline (ELAVIL) 75 MG tablet Take 1 tablet by mouth at bedtime 06/05/20    BBinnie Rail MD  aspirin EC 81 MG tablet Take 1 tablet (81 mg total) by mouth daily. 11/01/15   Elgergawy, DSilver Huguenin MD  atorvastatin (LIPITOR) 20 MG tablet Take 1 tablet by mouth every day 01/12/20   BBinnie Rail MD  Blood Glucose Monitoring Suppl (ACCU-CHEK GUIDE) w/Device KIT 1 each by Does not apply route 4 (four) times daily. E11.65    [provider]  budesonide-formoterol (SYMBICORT) 80-4.5 MCG/ACT inhaler Inhale 2 puffs into the lungs as needed (wheezing/SOB). 01/12/20   BBinnie Rail MD  cloNIDine (CATAPRES) 0.3 MG tablet Take 1 tablet by mouth every day 06/05/20   BBinnie Rail MD  erythromycin ophthalmic ointment Place 1 application into the right eye at bedtime. 07/18/20   BBinnie Rail MD  fluconazole (DIFLUCAN) 150 MG tablet Take one tablet every 72 hrs 07/18/20   BBinnie Rail MD  fluticasone ([facility]  50 MCG/ACT nasal spray Shake liquid & use 1 spray into each nostril every day as needed for allergies 06/04/20   Binnie Rail, MD  furosemide (LASIX) 80 MG tablet Take 1 tablet by mouth every day 06/04/20   Binnie Rail, MD  gabapentin (NEURONTIN) 600 MG tablet Take 2 tablets by mouth 3 times a day, need appt 06/04/20   Binnie Rail, MD  hydrOXYzine (ATARAX/VISTARIL) 10 MG tablet SMARTSIG:1 Tablet(s) By Mouth Every 12 Hours 07/02/20   [provider]  hydrOXYzine (ATARAX/VISTARIL) 25 MG tablet Take 25-50 mg by mouth at bedtime as needed. 05/07/20   [provider]  insulin glargine, 2 Unit Dial, (TOUJEO MAX SOLOSTAR) 300 UNIT/ML Solostar Pen Inject 170 Units into the skin every morning. And pen needles 1/day 05/31/20   Renato Shin, MD  Insulin Pen Needle (PEN NEEDLES) 32G X 5 MM MISC UAD daily for insulin 01/17/20   Burns, Claudina Lick, MD  ketorolac (TORADOL) 10 MG tablet Take 10 mg by mouth every 8 (eight) hours. 05/07/20   [provider]  losartan (COZAAR) 50 MG tablet Take 1 tablet by mouth twice daily 06/04/20   Binnie Rail, MD  meloxicam  (MOBIC) 15 MG tablet Take 15 mg by mouth daily. 02/09/20   [provider]  methocarbamol (ROBAXIN) 500 MG tablet Take 1 tablet (500 mg total) by mouth 4 (four) times daily. 05/03/20   Binnie Rail, MD  metoprolol (TOPROL-XL) 200 MG 24 hr tablet Take 1 tablet by mouth twice daily 06/04/20   Binnie Rail, MD  [facility] 4 MG/0.1ML LIQD nasal spray kit SMARTSIG:1 Spray(s) Both Nares Once PRN 11/17/19   [provider]  nitroGLYCERIN (NITROSTAT) 0.4 MG SL tablet Place 0.4 mg under the tongue every 5 (five) minutes as needed for chest pain.    [provider]  Nutritional Supplements (GLUCERNA HUNGER SMART SHAKE) LIQD Take 1 each by mouth 2 (two) times daily. 05/31/20   Renato Shin, MD  oxyCODONE-acetaminophen (PERCOCET) 10-325 MG tablet Take 1 tablet by mouth every 4 (four) hours as needed. 10/19/19   [provider]  OZEMPIC, 1 MG/DOSE, 4 MG/3ML SOPN Inject into the skin. 07/10/20   [provider]  pantoprazole (PROTONIX) 40 MG tablet Take one tablet by mouth twice daily (am and pm) 01/24/20   Burns, Claudina Lick, MD  potassium chloride SA (KLOR-CON) 20 MEQ tablet Take 1 tablet by mouth every day 06/04/20   Binnie Rail, MD  tiZANidine (ZANAFLEX) 4 MG tablet Take 4 mg by mouth at bedtime. 05/16/20   [provider]  TOUJEO SOLOSTAR 300 UNIT/ML Solostar Pen Inject into the skin. 07/15/20   [provider]  ZTLIDO 1.8 % PTCH SMARTSIG:1 Patch(s) Topical 3 Times Daily 06/05/20   [provider]    Physical Exam: Vitals:   09/01/20 0030 09/01/20 0115 09/01/20 0200 09/01/20 0300  BP: (!) 133/91 (!) 157/81 (!) 175/91 (!) 146/117  Pulse: (!) 57 (!) 57 60 (!) 58  Resp: 19 (!) 27 (!) 21 15  Temp:      TempSrc:      SpO2: 95% 92% 100% 94%    Constitutional: Awake alert and oriented x3, no associated distress.   Skin: no rashes, no lesions, good skin turgor noted. Eyes: Pupils are equally reactive to light.  No evidence of scleral icterus or  conjunctival pallor.  ENMT: Moist mucous membranes noted.  Posterior pharynx clear of any exudate or lesions.   Neck: normal, supple,  no masses, no thyromegaly.  No evidence of jugular venous distension.   Respiratory: Notable bibasilar mid field rales with intermittent expiratory wheezing.  Some increased respiratory effort noted without accessory muscle use.  Cardiovascular: Regular rate and rhythm, no murmurs / rubs / gallops. No extremity edema. 2+ pedal pulses. No carotid bruits.  Chest:   Nontender without crepitus or deformity.   Back:   Nontender without crepitus or deformity. Abdomen: Abdomen is soft and nontender.  No evidence of intra-abdominal masses.  Positive bowel sounds noted in all quadrants.   Musculoskeletal: Some tenderness of the right thigh noted.  Some pain of the right thigh noted with both passive and active range of motion of either the right hip or right knee. no contractures. Normal muscle tone.  Bevelyn Buckles' sign negative. Neurologic: CN 2-12 grossly intact. Sensation intact.  Patient moving all 4 extremities spontaneously.  Patient is following all commands.  Patient is responsive to verbal stimuli.   Psychiatric: Patient exhibits normal mood with flat affect.  Patient seems to possess insight as to their current situation.     Labs on Admission: I have personally reviewed following labs and imaging studies -   CBC: Recent Labs  Lab 08/31/20 1920  WBC 6.0  NEUTROABS 2.5  HGB 13.3  HCT 42.4  MCV 86.5  PLT 300   Basic Metabolic Panel: Recent Labs  Lab 08/31/20 1920  NA 135  K 4.3  CL 97*  CO2 32  GLUCOSE 247*  BUN 8  CREATININE 0.93  CALCIUM 8.8*  MG 1.9   GFR: CrCl cannot be calculated (Unknown ideal weight.). Liver Function Tests: No results for input(s): AST, ALT, ALKPHOS, BILITOT, PROT, ALBUMIN in the last 168 hours. No results for input(s): LIPASE, AMYLASE in the last 168 hours. No results for input(s): AMMONIA in the last 168  hours. Coagulation Profile: No results for input(s): INR, PROTIME in the last 168 hours. Cardiac Enzymes: No results for input(s): CKTOTAL, CKMB, CKMBINDEX, TROPONINI in the last 168 hours. BNP (last 3 results) No results for input(s): PROBNP in the last 8760 hours. HbA1C: No results for input(s): HGBA1C in the last 72 hours. CBG: No results for input(s): GLUCAP in the last 168 hours. Lipid Profile: Recent Labs    08/31/20 1920  TRIG 54   Thyroid Function Tests: No results for input(s): TSH, T4TOTAL, FREET4, T3FREE, THYROIDAB in the last 72 hours. Anemia Panel: Recent Labs    09/01/20 0038  FERRITIN 79   Urine analysis:    Component Value Date/Time   COLORURINE STRAW (A) 09/16/2019 1237   APPEARANCEUR CLEAR 09/16/2019 1237   LABSPEC 1.015 09/16/2019 1237   PHURINE 5.0 09/16/2019 1237   GLUCOSEU >=500 (A) 09/16/2019 1237   GLUCOSEU >=1000 (A) 07/21/2016 1039   HGBUR NEGATIVE 09/16/2019 1237   BILIRUBINUR negative 11/07/2019 1156   KETONESUR NEGATIVE 09/16/2019 1237   PROTEINUR Negative 11/07/2019 1156   PROTEINUR NEGATIVE 09/16/2019 1237   UROBILINOGEN 0.2 11/07/2019 1156   UROBILINOGEN 0.2 07/21/2016 1039   NITRITE negative 11/07/2019 1156   NITRITE NEGATIVE 09/16/2019 1237   LEUKOCYTESUR Negative 11/07/2019 1156   LEUKOCYTESUR NEGATIVE 09/16/2019 1237    Radiological Exams on Admission - Personally Reviewed: CT Angio Chest PE W/Cm &/Or Wo Cm  Result Date: 09/01/2020 CLINICAL DATA:  PE suspected, high prob Cough and fever.  Leg pain and swelling. EXAM: CT ANGIOGRAPHY CHEST WITH CONTRAST TECHNIQUE: Multidetector CT imaging of the chest was performed using the standard protocol during bolus administration of intravenous contrast.  Multiplanar CT image reconstructions and MIPs were obtained to evaluate the vascular anatomy. CONTRAST:  75m OMNIPAQUE IOHEXOL 350 MG/ML SOLN COMPARISON:  Radiograph earlier today. FINDINGS: Cardiovascular: There are no filling defects within  the pulmonary arteries to suggest pulmonary embolus. Fusiform aneurysmal dilatation of the ascending aorta maximal dimension 4.4 cm. No evidence of acute aortic findings. Minimal aortic atherosclerosis. Conventional branching pattern from the aortic arch. Borderline cardiomegaly. No pericardial effusion. Mediastinum/Nodes: Prominent right hilar lymph nodes, largest measuring 15 mm. There are small left hilar lymph nodes that are subcentimeter short axis. 9 mm AP window node. Patulous esophagus without wall thickening. Enlarged right lobe of the thyroid gland with question of 2.7 cm nodule. No axillary adenopathy. Lungs/Pleura: Mild apical predominant emphysema. Central bronchial thickening. Breathing motion artifact limits detailed parenchymal assessment. No focal consolidation. No pulmonary mass or suspicious nodule. No pleural fluid. Upper Abdomen: No acute findings.  Cholecystectomy. Musculoskeletal: Diffuse thoracic spondylosis with endplate spurring. There are no acute or suspicious osseous abnormalities. Review of the MIP images confirms the above findings. IMPRESSION: 1. No pulmonary embolus. 2. Fusiform aneurysmal dilatation of the ascending aorta maximal dimension 4.4 cm. No acute aortic findings. Recommend annual imaging followup by CTA or MRA. This recommendation follows 2010 ACCF/AHA/AATS/ACR/ASA/SCA/SCAI/SIR/STS/SVM Guidelines for the Diagnosis and Management of Patients with Thoracic Aortic Disease. Circulation. 2010; 121:: O962-X528 Aortic aneurysm NOS (ICD10-I71.9) 3. Prominent right hilar and AP window lymph nodes are likely reactive. 4. Mild emphysema and bronchial thickening. No focal airspace disease. 5. Enlarged right lobe of the thyroid gland with question of 2.7 cm nodule. Recommend thyroid [place](ref: J Am Coll Radiol. 2015 Feb;12(2): 143-50). Aortic Atherosclerosis (ICD10-I70.0) and Emphysema (ICD10-J43.9). Electronically Signed   By: MKeith RakeM.D.   On: 09/01/2020 00:10   DG Chest  Port 1 View  Result Date: 08/31/2020 CLINICAL DATA:  Fever, cough. EXAM: PORTABLE CHEST 1 VIEW COMPARISON:  April 18, 2020. FINDINGS: The heart size and mediastinal contours are within normal limits. Both lungs are clear. The visualized skeletal structures are unremarkable. IMPRESSION: No active disease. Electronically Signed   By: JMarijo ConceptionM.D.   On: 08/31/2020 21:10    Bedside telemetry: Personally reviewed.  Rhythm is normal sinus rhythm heart rate of 60 bpm.  No dynamic ST segment changes appreciated.  Assessment/Plan Principal Problem:   COVID-19 virus infection with evidence of acute hypoxic respiratory failure   Patient presenting with 3-day history of increasing shortness of breath and cough.  Patient found to be COVID PCR positive  Throughout emergency department stay patient found to exhibit hypoxia with oxygen saturations in the 80s requiring initiation of supplemental oxygen via nasal cannula with a target oxygen saturation of 94%.  Patient has been initiated on intravenous remdesivir as well as systemic steroids with Solu-Medrol  Procalcitonin normal making superimposed bacterial infection unlikely  Placing patient in airborne and contact isolation  Providing patient with as needed bronchodilator therapy  Providing patient with as needed antitussive therapy  Daily zinc and vitamin C supplementation  Active Problems: Right thigh pain   Patient additionally has been complaining of pain in her right thigh over the past 3 days.  This is particularly concerning due to the fact the patient has a history of thromboembolism in the past and has an elevated D-dimer in the setting of COVID infection  CT angiogram of the chest negative for pulmonary embolism  Additionally ordering ultrasounds of the bilateral lower extremities to rule out DVT  In the meantime, treating patient with  60m/kg of Lovenox until we have ruled out DVT.  Benign essential HTN   Continue  home regimen of losartan and metoprolol    COPD (chronic obstructive pulmonary disease) (HCC)   Patient exhibiting evidence of expiratory wheezing on examination   While I do not believe patient is suffering from a COPD exacerbation per se, will provide patient with.  Bronchodilator therapy via MDI or shortness of breath and wheezing.    Chronic diastolic congestive heart failure (HCC)   No evidence of cardiogenic volume overload    Uncontrolled type 2 diabetes mellitus with hyperglycemia, with long-term current use of insulin (HCC)   Adjusting home insulin regimen of high-dose basal insulin therapy to be a basal bolus insulin regimen including 100 units of Lantus daily with 20 units of NovoLog before every meal   Accu-Cheks before every meal and nightly with sliding scale insulin   Hemoglobin A1c pending   nicotine dependence, cigarettes, uncomplicated   Counseling patient on cessation daily    GERD (gastroesophageal reflux disease)   Continue home regimen of PPI  Ascending thoracic aortic aneurysm   Incidental finding on CT imaging  Outpatient follow-up for surveillance  Code Status:  Full code Family Communication: deferred   Status is: Observation  The patient remains OBS appropriate and will d/c before 2 midnights.  Dispo: The patient is from: Home              Anticipated d/c is to: Home              Patient currently is not medically stable to d/c.   Difficult to place patient No        GVernelle EmeraldMD [facility] Pager [phone] If 7PM-7AM, please contact night-coverage [email or web address] Use universal [place] password for that web site. If you do not have the password, please call the hospital operator.  09/01/2020, 3:51 AM

## 2020-09-01 NOTE — ED Notes (Signed)
Pt sit to stand to bedside commode with minimal assistance  

## 2020-09-01 NOTE — ED Notes (Signed)
Pt has been verbally aggressive this shift. Pt has stated that she wants to leave. MD aware and came to talk with pt. Pt now agreeable to stay until test is done.

## 2020-09-01 NOTE — Progress Notes (Signed)
   Brief same day note:  Patient is a 60 year old female with history of diabetes type 2, coronary artery  disease, hypertension, COPD, OSA, diabetic polyneuropathy, diastolic congestive heart failure, hyperlipidemia, and nicotine dependence who presented with cough, congestion, pain behind the right knee/right calf.  She is unvaccinated against COVID.  On presentation she was found to be hypoxic and had to be put on nasal cannula for the maintenance of saturation.  D-dimer was found to be elevated, CT angiogram of the chest was negative for PE but found a 4.4 cm ascending thoracic aortic aneurysm without dissection. COVID screen test came out to be positive.  Patient was admitted for acute hypoxic respiratory  failure secondary to COVID illness and is started on remdesivir and Solu-Medrol.  D-dimer was elevated which could be also from COVID infection but she was also complaining of pain on the right call and  behind the right knee, there was concern of DVT so she has been started on therapeutic dose of Lovenox and DVT study has been ordered which is pending. Patient seen and examined at the bedside this morning.  During my evaluation, she was hemodynamically stable.  She was actually willing to leave AMA but after my conversation with her, she agreed to stay.  She was concerned about possibility of clot on her right lower extremity. Will continue current management today.

## 2020-09-01 NOTE — ED Provider Notes (Signed)
CT angio shows incidental finding of a sending aortic aneurysm 4.4 cm no active leaking noted.  No evidence of pulmonary embolism.  Patient's O2 saturation dropped to 85% on room air, on 2 L she sats about 90 to 93% which is improved.  Given her hypoxemia, patient started on Decadron and given order for remdesivir.  We will brought to the hospitalist team, COVID labs also added on.   Luna Fuse, MD 09/01/20 [phone]

## 2020-09-01 NOTE — ED Notes (Signed)
Pt is alert and oriented and wants to leave. MD aware. Pt not happy about food tray and wants to order her own food. Pt states she just wants ultra sound and wants to leave.

## 2020-09-01 NOTE — Significant Event (Signed)
[facility] on-call notified per [facility] order POC CBG is 421, ordered stat random glucose to verify Stat lab glucose verification also 421, page MD 2300 awaiting response MD ordered 22units insulin

## 2020-09-02 ENCOUNTER — Inpatient Hospital Stay (HOSPITAL_COMMUNITY): Payer: 59

## 2020-09-02 ENCOUNTER — Telehealth: Payer: Self-pay | Admitting: Endocrinology

## 2020-09-02 DIAGNOSIS — U071 COVID-19: Secondary | ICD-10-CM

## 2020-09-02 DIAGNOSIS — M79651 Pain in right thigh: Secondary | ICD-10-CM

## 2020-09-02 DIAGNOSIS — M79652 Pain in left thigh: Secondary | ICD-10-CM

## 2020-09-02 LAB — CBC WITH DIFFERENTIAL/PLATELET
Abs Immature Granulocytes: 0.04 10*3/uL (ref 0.00–0.07)
Basophils Absolute: 0 10*3/uL (ref 0.0–0.1)
Basophils Relative: 0 %
Eosinophils Absolute: 0 10*3/uL (ref 0.0–0.5)
Eosinophils Relative: 0 %
HCT: 44.9 % (ref 36.0–46.0)
Hemoglobin: 14.1 g/dL (ref 12.0–15.0)
Immature Granulocytes: 0 %
Lymphocytes Relative: 21 %
Lymphs Abs: 2.5 10*3/uL (ref 0.7–4.0)
MCH: 26.7 pg (ref 26.0–34.0)
MCHC: 31.4 g/dL (ref 30.0–36.0)
MCV: 85 fL (ref 80.0–100.0)
Monocytes Absolute: 1.2 10*3/uL — ABNORMAL HIGH (ref 0.1–1.0)
Monocytes Relative: 10 %
Neutro Abs: 8 10*3/uL — ABNORMAL HIGH (ref 1.7–7.7)
Neutrophils Relative %: 69 %
Platelets: 242 10*3/uL (ref 150–400)
RBC: 5.28 MIL/uL — ABNORMAL HIGH (ref 3.87–5.11)
RDW: 15.1 % (ref 11.5–15.5)
WBC: 11.8 10*3/uL — ABNORMAL HIGH (ref 4.0–10.5)
nRBC: 0 % (ref 0.0–0.2)

## 2020-09-02 LAB — COMPREHENSIVE METABOLIC PANEL
ALT: 63 U/L — ABNORMAL HIGH (ref 0–44)
AST: 82 U/L — ABNORMAL HIGH (ref 15–41)
Albumin: 3.6 g/dL (ref 3.5–5.0)
Alkaline Phosphatase: 108 U/L (ref 38–126)
Anion gap: 9 (ref 5–15)
BUN: 17 mg/dL (ref 6–20)
CO2: 28 mmol/L (ref 22–32)
Calcium: 9.3 mg/dL (ref 8.9–10.3)
Chloride: 96 mmol/L — ABNORMAL LOW (ref 98–111)
Creatinine, Ser: 1.05 mg/dL — ABNORMAL HIGH (ref 0.44–1.00)
GFR, Estimated: >60 mL/min (ref >60)
Glucose, Bld: 332 mg/dL — ABNORMAL HIGH (ref 70–99)
Potassium: 5.3 mmol/L — ABNORMAL HIGH (ref 3.5–5.1)
Sodium: 133 mmol/L — ABNORMAL LOW (ref 135–145)
Total Bilirubin: 0.6 mg/dL (ref 0.3–1.2)
Total Protein: 8 g/dL (ref 6.5–8.1)

## 2020-09-02 LAB — GLUCOSE, CAPILLARY
Glucose-Capillary: 292 mg/dL — ABNORMAL HIGH (ref 70–99)
Glucose-Capillary: 400 mg/dL — ABNORMAL HIGH (ref 70–99)
Glucose-Capillary: 395 mg/dL — ABNORMAL HIGH (ref 70–99)
Glucose-Capillary: 330 mg/dL — ABNORMAL HIGH (ref 70–99)

## 2020-09-02 LAB — D-DIMER, QUANTITATIVE: D-Dimer, Quant: 0.59 ug/mL-FEU — ABNORMAL HIGH (ref 0.00–0.50)

## 2020-09-02 LAB — C-REACTIVE PROTEIN: CRP: 0.8 mg/dL (ref <1.0)

## 2020-09-02 LAB — MAGNESIUM: Magnesium: 2.1 mg/dL (ref 1.7–2.4)

## 2020-09-02 MED ORDER — ENOXAPARIN SODIUM 60 MG/0.6ML IJ SOSY
60.0000 mg | PREFILLED_SYRINGE | INTRAMUSCULAR | Status: DC
  Administered 2020-09-03: 60 mg via SUBCUTANEOUS
  Filled 2020-09-02: qty 0.6

## 2020-09-02 MED ORDER — INSULIN ASPART 100 UNIT/ML IJ SOLN
25.0000 [IU] | Freq: Three times a day (TID) | INTRAMUSCULAR | Status: DC
  Administered 2020-09-02 – 2020-09-03 (×3): 25 [IU] via SUBCUTANEOUS

## 2020-09-02 MED ORDER — INSULIN GLARGINE 100 UNIT/ML ~~LOC~~ SOLN: 120.0000 [IU] | Freq: Every day | SUBCUTANEOUS | Status: DC

## 2020-09-02 MED ORDER — INSULIN GLARGINE 100 UNIT/ML ~~LOC~~ SOLN
20.0000 [IU] | Freq: Once | SUBCUTANEOUS | Status: AC
  Administered 2020-09-02: 20 [IU] via SUBCUTANEOUS
  Filled 2020-09-02: qty 0.2

## 2020-09-02 MED ORDER — PREDNISONE 20 MG PO TABS
40.0000 mg | ORAL_TABLET | Freq: Every day | ORAL | Status: DC
  Administered 2020-09-03: 40 mg via ORAL
  Filled 2020-09-02: qty 2

## 2020-09-02 MED ORDER — INSULIN ASPART 100 UNIT/ML IJ SOLN
10.0000 [IU] | Freq: Once | INTRAMUSCULAR | Status: AC
  Administered 2020-09-02: 10 [IU] via INTRAVENOUS

## 2020-09-02 MED ORDER — LIDOCAINE 5 % EX PTCH
1.0000 | MEDICATED_PATCH | CUTANEOUS | Status: DC
  Administered 2020-09-02: 1 via TRANSDERMAL
  Filled 2020-09-02 (×2): qty 1

## 2020-09-02 MED ORDER — PANTOPRAZOLE SODIUM 40 MG PO TBEC: 40.0000 mg | DELAYED_RELEASE_TABLET | Freq: Every day | ORAL | Status: DC

## 2020-09-02 MED ORDER — IBUPROFEN 800 MG PO TABS
800.0000 mg | ORAL_TABLET | Freq: Four times a day (QID) | ORAL | Status: DC | PRN
  Administered 2020-09-02: 800 mg via ORAL
  Filled 2020-09-02: qty 1

## 2020-09-02 MED ORDER — INSULIN GLARGINE 100 UNIT/ML ~~LOC~~ SOLN
150.0000 [IU] | Freq: Every day | SUBCUTANEOUS | Status: DC
  Administered 2020-09-03: 150 [IU] via SUBCUTANEOUS
  Filled 2020-09-02: qty 1.5

## 2020-09-02 MED ORDER — INSULIN GLARGINE 100 UNIT/ML ~~LOC~~ SOLN
100.0000 [IU] | Freq: Every day | SUBCUTANEOUS | Status: AC
  Administered 2020-09-02: 100 [IU] via SUBCUTANEOUS
  Filled 2020-09-02: qty 1

## 2020-09-02 MED ORDER — OXYCODONE HCL 5 MG PO TABS
10.0000 mg | ORAL_TABLET | Freq: Four times a day (QID) | ORAL | Status: DC | PRN
  Administered 2020-09-02 – 2020-09-03 (×3): 10 mg via ORAL
  Filled 2020-09-02 (×3): qty 2

## 2020-09-02 NOTE — Telephone Encounter (Signed)
09/13/20, 4:30 

## 2020-09-02 NOTE — Progress Notes (Addendum)
Inpatient Diabetes Program Recommendations  AACE/ADA: New Consensus Statement on Inpatient Glycemic Control (2015)  Target Ranges:  Prepandial:   less than 140 mg/dL      Peak postprandial:   less than 180 mg/dL (1-2 hours)      Critically ill patients:  140 - 180 mg/dL   Results for Tammy Strickland, Tammy Strickland (MRN [id number]) as of 09/02/2020 06:52  Ref. Range 09/01/2020 07:56 09/01/2020 09:16 09/01/2020 11:15 09/01/2020 12:44 09/01/2020 16:32 09/01/2020 21:57  Glucose-Capillary Latest Ref Range: 70 - 99 mg/dL 400 (H)  20 units NOVOLOG _0 :22am 386 (H)  20 units NOVOLOG _1 :22am 376 (H)  20 units NOVOLOG _2 :21am  100 units LANTUS _3 :06am 331 (H)  20 units NOVOLOG _4 :59pm 348 (H)  35 units NOVOLOG _5 :29pm 421 (H)  22 units NOVOLOG _6 :11pm   Results for Tammy Strickland, Tammy Strickland (MRN [id number]) as of 09/02/2020 09:36  Ref. Range 09/02/2020 07:57  Glucose-Capillary Latest Ref Range: 70 - 99 mg/dL 292 (H)  31 units NOVOLOG _7 :35am   Results for Tammy Strickland, Tammy Strickland (MRN [id number]) as of 09/02/2020 06:52  Ref. Range 01/25/2020 08:01 05/31/2020 11:12 09/01/2020 05:00  Hemoglobin A1C Latest Ref Range: 4.8 - 5.6 % 12.8 (A) 10.7 (A) 9.7 (H)  (231 mg/dl)     Admit with: COVID-19 virus infection with evidence of acute hypoxic respiratory failure/ Right thigh pain  History: DM, CHF  Home DM Meds: Toujeo 85 units BID if CBG <250 in the AM       Toujeo 170 units Daily if CBG > in the AM   Current Orders: Lantus 120 units Daily      Novolog Resistant Correction Scale/ SSI (0-20 units) TID AC + HS      Novolog 20 units TID with meals    Endocrinologist: Dr. Loanne Drilling with [facility]--Last seen 05/31/2020 Toujeo Insulin dose increased to 170 units Daily at that visit    Getting Solumedrol 70 mg BID   MD- Note that Lantus dose increased to 120 units Daily this AM  May also consider increasing the Novolog Meal Coverage further to 25 units TID with meals while pt remains on IV Steroids   Addendum  1:30pm--Met w/ pt at bedside.  Reviewed steady drop in A1c levels since Oct 2021.  Pt told me Dr. Loanne Drilling (her ENDO) increased her Toujeo insulin to 170 units Daily at home at her last visit.  Pt told me she will often break her 170 units up throughout the day and give a portion in the AM, a portion at lunch, and the remaining portion at dinner.  Stated her ENDO knows that she does this with her insulin.  We discussed how her current CBGs are likely also elevated due to the strong steroids she is getting in the hospital setting.  While I was in the room with pt, pt called the ENDO office to schedule a follow up--next available appt not until 11/04/2020.  Pt requesting sooner visit (and I concur with this) as pt has COVID and elevated CBGs.     .--Will follow patient during hospitalization--  Wyn Quaker RN, MSN, CDE Diabetes Coordinator Inpatient Glycemic Control Team Team Pager: [phone] (8a-5p)

## 2020-09-02 NOTE — Progress Notes (Signed)
BLE venous duplex has been completed.  Preliminary results given to Kend[place], [place].  Results can be found under chart review under CV PROC. 09/02/2020 12:09 PM Temisha Murley RVT, RDMS

## 2020-09-02 NOTE — Progress Notes (Signed)
Patient's BG is 400. Provider paged and notified per orders

## 2020-09-02 NOTE — Progress Notes (Signed)
ANTICOAGULATION CONSULT NOTE - Initial Consult  Pharmacy Consult for LMWH Indication: VTE prophylaxis  Allergies  Allergen Reactions  . Metformin And Related Other (See Comments)    Lactic acidosis  . Other Other (See Comments)    H/o pancreatitis  . Ozempic (0.25 Or 0.5 Mg-Dose) [Semaglutide(0.25 Or 0.20m-Dos)]     H/o pancreatitis  . Bacitracin Hives    Patient Measurements: Height: _0  (167.6 cm) Weight: (!) 136.5 kg (300 lb 14.9 oz) IBW/kg (Calculated) : 59.3 Heparin Dosing Weight:   Vital Signs: Temp: 97.7 F (36.5 C) (05/23 0623) Temp Source: Oral (05/23 0623) BP: 151/93 (05/23 0939) Pulse Rate: 49 (05/23 0939)  Labs: Recent Labs    08/31/20 1920 09/02/20 0411  HGB 13.3 14.1  HCT 42.4 44.9  PLT 190 242  CREATININE 0.93 1.05*    Estimated Creatinine Clearance: 83.2 mL/min (A) (by C-G formula based on SCr of 1.05 mg/dL (H)).   Medical History: Past Medical History:  Diagnosis Date  . Anxiety   . Arthritis   . Asthma   . CHF (congestive heart failure) ([facility]   . COPD (chronic obstructive pulmonary disease) ([facility]   . Coronary artery disease   . Depression   . Diabetes mellitus without complication ([place]    type 2   . Fibromyalgia   . Gallstones   . GERD (gastroesophageal reflux disease)   . HLD (hyperlipidemia)   . Hypertension   . Lupus ([facility]   . Migraines   . [place]romuscular disorder ([facility]   . Osteoporosis   . Oxygen deficiency    pt uses 2.5L 02 at night,(08/25/2019)pt states she no longer uses oxygen, hasn't used in 7-8 years  . Pancreatitis   . Peripheral neuropathy   . Shingles   . Sleep apnea    had sleep study done recently ; unaware if she will be getting  a CPAP device ; patient states "im pretty sure i have it , i fall alseep all the time "  . Stroke ([facility] 10/2015    Medications:  Medications Prior to Admission  Medication Sig Dispense Refill Last Dose  . amitriptyline (ELAVIL) 75 MG tablet Take 1 tablet by mouth at bedtime  (Patient taking differently: Take 75 mg by mouth at bedtime.) 30 tablet 11 08/30/2020  . aspirin EC 81 MG tablet Take 1 tablet (81 mg total) by mouth daily.   08/31/2020 at Unknown time  . budesonide-formoterol (SYMBICORT) 80-4.5 MCG/ACT inhaler Inhale 2 puffs into the lungs as needed (wheezing/SOB). 10.2 g 11 unk  . cloNIDine (CATAPRES) 0.3 MG tablet Take 1 tablet by mouth every day (Patient taking differently: Take 0.3 mg by mouth daily.) 30 tablet 11 08/31/2020 at unk  . fluticasone (FLONASE) 50 MCG/ACT nasal spray Shake liquid & use 1 spray into each nostril every day as needed for allergies (Patient taking differently: Place 1 spray into both nostrils daily as needed for allergies.) 16 g 11 unk  . furosemide (LASIX) 80 MG tablet Take 1 tablet by mouth every day (Patient taking differently: Take 80 mg by mouth daily.) 30 tablet 11 08/31/2020 at unk  . gabapentin (NEURONTIN) 600 MG tablet Take 2 tablets by mouth 3 times a day, need appt (Patient taking differently: Take 1,200 mg by mouth 3 (three) times daily.) 180 tablet 11 08/31/2020 at unk  . hydrOXYzine (ATARAX/VISTARIL) 10 MG tablet Take 10 mg by mouth 2 (two) times daily.   08/31/2020 at unk  . losartan (COZAAR) 50 MG tablet Take 1 tablet by  mouth twice daily (Patient taking differently: Take 50 mg by mouth 2 (two) times daily.) 60 tablet 11 08/31/2020 at unk  . meloxicam (MOBIC) 15 MG tablet Take 15 mg by mouth daily.   08/31/2020 at unk  . metoprolol (TOPROL-XL) 200 MG 24 hr tablet Take 1 tablet by mouth twice daily (Patient taking differently: Take 200 mg by mouth 2 (two) times daily.) 60 tablet 11 08/31/2020 at 1000  . NARCAN 4 MG/0.1ML LIQD nasal spray kit Place 1 spray into the nose once.   unk  . nitroGLYCERIN (NITROSTAT) 0.4 MG SL tablet Place 0.4 mg under the tongue every 5 (five) minutes as needed for chest pain.   unk  . Nutritional Supplements (GLUCERNA HUNGER SMART SHAKE) LIQD Take 1 each by mouth 2 (two) times daily. 47300 mL 3 Past Week  at Unknown time  . oxyCODONE-acetaminophen (PERCOCET) 10-325 MG tablet Take 1 tablet by mouth every 4 (four) hours as needed for pain.   08/31/2020 at unk  . pantoprazole (PROTONIX) 40 MG tablet Take one tablet by mouth twice daily (am and pm) (Patient taking differently: Take 40 mg by mouth 2 (two) times daily.) 60 tablet 11 08/31/2020 at unk  . potassium chloride SA (KLOR-CON) 20 MEQ tablet Take 1 tablet by mouth every day 30 tablet 11 08/31/2020 at Unknown time  . tiZANidine (ZANAFLEX) 4 MG tablet Take 4 mg by mouth at bedtime.   08/30/2020  . ZTLIDO 1.8 % PTCH Apply 1 patch topically daily as needed (knee pain).   unk  . ACCU-CHEK GUIDE test strip TEST FOUR TIMES DAILY (Patient taking differently: 1 each by Other route 4 (four) times daily. E11.65) 100 strip 2 unk at unk  . Accu-Chek Softclix Lancets lancets 1 each by Other route 4 (four) times daily. E11.65   unk at unk  . atorvastatin (LIPITOR) 20 MG tablet Take 1 tablet by mouth every day (Patient not taking: No sig reported) 30 tablet 11 Not Taking at Unknown time  . Blood Glucose Monitoring Suppl (ACCU-CHEK GUIDE) w/Device KIT 1 each by Does not apply route 4 (four) times daily. E11.65     . erythromycin ophthalmic ointment Place 1 application into the right eye at bedtime. (Patient not taking: No sig reported) 3.5 g 0 Completed Course at Unknown time  . fluconazole (DIFLUCAN) 150 MG tablet Take one tablet every 72 hrs (Patient not taking: No sig reported) 3 tablet 0 Completed Course at Unknown time  . insulin glargine, 2 Unit Dial, (TOUJEO MAX SOLOSTAR) 300 UNIT/ML Solostar Pen Inject 170 Units into the skin every morning. And pen needles 1/day (Patient taking differently: Inject 85-170 Units into the skin See admin instructions. Take according to blood sugar reading twice daily: If blood sugar is >250 in the morning take 170 units in the morning ONLY. If blood sugar is <250 in the morning give 85 units in the morning and 85 units in the evening)  60 mL 3 unk at unk  . Insulin Pen Needle (PEN NEEDLES) 32G X 5 MM MISC UAD daily for insulin 90 each 1 unk at unk  . methocarbamol (ROBAXIN) 500 MG tablet Take 1 tablet (500 mg total) by mouth 4 (four) times daily. (Patient not taking: No sig reported) 120 tablet 5 Completed Course at Unknown time    Assessment: Pharmacy consulted for Lovenox for VTE prophylaxis.  She has received 4 doses of full dose LMWH 138 mg sq q12h -last dose 5/23 @ 1301.   VQ neg for PE,  dopplers neg for DVT. CBC WNL, SCr 1.05, CrCl > 30 ml/min.   Plan:  Lovenox 60 mg q24 to start 5/24 @ noon for VTE px Pharmacy to sign off  Eudelia Bunch, Pharm.D 09/02/2020 1:52 PM

## 2020-09-02 NOTE — Significant Event (Signed)
Pt failed to order dinner try, RN made aware that she did not receive dinner tray @ 1945, when RN inquired if she ordered she said she forgot.  RN immediately called food and nutrition who attempted to call the kitchen.  The kitchen did not answer.  RN told her what we had in food and nutrition room she said she would like the chicken soup and a sandwich.  The RN immediately brought her 2 chicken noodle soup cups with crackers and a [place] sandwich

## 2020-09-02 NOTE — Telephone Encounter (Signed)
Please advise 

## 2020-09-02 NOTE — Progress Notes (Signed)
PROGRESS NOTE    Tammy Strickland  VQQ:[id number] DOB: May 11, 1960 DOA: 08/31/2020 PCP: Binnie Rail, MD   Chief Complain:Home  Brief Narrative: Patient is a 60 year old female with history of diabetes type 2, coronary artery  disease, hypertension, COPD, OSA, diabetic polyneuropathy, diastolic congestive heart failure, hyperlipidemia, and nicotine dependence who presented with cough, congestion, pain behind the right knee/right calf.  She is unvaccinated against COVID.  On presentation she was found to be hypoxic and had to be put on nasal cannula for the maintenance of saturation.  D-dimer was found to be elevated, CT angiogram of the chest was negative for PE but found a 4.4 cm ascending thoracic aortic aneurysm without dissection. COVID screen test came out to be positive.  Patient was admitted for acute hypoxic respiratory  failure secondary to COVID illness and is started on remdesivir and Solu-Medrol.  D-dimer was elevated which could be also from COVID infection but she was also complaining of pain on the right call and  behind the right knee, there was concern of DVT so she has been started on therapeutic dose of Lovenox. This morning she has been on room air.  Hospital course remarkable for uncontrolled hyperglycemia secondary to steroid therapy.  Assessment & Plan:   Principal Problem:   COVID-19 virus infection Active Problems:   Benign essential HTN   COPD (chronic obstructive pulmonary disease) (HCC)   GERD (gastroesophageal reflux disease)   Chronic diastolic congestive heart failure ([place])   Uncontrolled type 2 diabetes mellitus with hyperglycemia, with long-term current use of insulin (HCC)   Nicotine dependence, cigarettes, uncomplicated   Thoracic ascending aortic aneurysm (HCC)   COVID illness/acute hypoxic respiratory failure: Presented with 3-day history of increasing shortness of breath, cough.  She is unvaccinated.  COVID screen test came out to be positive.  X-ray  done in the emergency department did not show pneumonia but she was hypoxic saturating 80s in the room air.  Oxygen saturation improved with supplemental oxygen.  Started on IV steroid and remdesivir.  Procalcitonin normal. This morning she was on room air.  Continue zinc and vitamin C, bronchodilators as needed, antitussives, incentive spirometry.  Continue airborne precaution.  Right knee pain: Complains of pain mainly on the right popliteal area.  Venous Doppler was negative for DVT.  Will check x-ray of the knee.  There is no underlying mass or tenderness on palpation.  Uncontrolled diabetes: Hemoglobin A1c of 9.7.  Takes high-dose of insulin at home.  Diabetic coordinator following.  Hyperglycemia worsened due to steroid therapy.  Monitor.  She follows with endocrinology, Dr. Loanne Drilling.  She is compliant with insulin.  Hypertension: Currently blood stable.  Continue losartan, metoprolol.  COPD: She was wheezing on presentation.  She has been started on a steroid.  Chronic diastolic congestive heart failure: Currently euvolemic.  Takes Lasix 80 mg daily at home.  Nicotine dependence: Smokes 6 cigarettes a day.  Counseled cessation.  GERD: Continue PPI  Ascending thoracic aortic aneurysm: CT angiogram of the chest was negative for PE but found a 4.4 cm ascending thoracic aortic aneurysm without dissection.  Outpatient follow-up should be done.  Debility/deconditioning: Has ambulatory problems.  Uses cane.  We have requested physical therapy assessment.  Morbid obesity: BMI 48.5               DVT prophylaxis:Lovenox Code Status: Full Family Communication: None at bedside Status is: Inpatient  Remains inpatient appropriate because:Inpatient level of care appropriate due to severity of illness   Dispo:  The patient is from: Home              Anticipated d/c is to: Home              Patient currently is not medically stable to d/c.   Difficult to place patient  No     Consultants: None  Procedures:None  Antimicrobials:  Anti-infectives (From admission, onward)   Start     Dose/Rate Route Frequency Ordered Stop   09/02/20 1000  remdesivir 100 mg in sodium chloride 0.9 % 100 mL IVPB       "Followed by" Linked Group Details   100 mg 200 mL/hr over 30 Minutes Intravenous Daily 09/01/20 0050 09/06/20 0959   09/01/20 0200  remdesivir 100 mg in sodium chloride 0.9 % 100 mL IVPB       "Followed by" Linked Group Details   100 mg 200 mL/hr over 30 Minutes Intravenous  Once 09/01/20 0050 09/01/20 0215   09/01/20 0100  remdesivir 100 mg in sodium chloride 0.9 % 100 mL IVPB       "Followed by" Linked Group Details   100 mg 200 mL/hr over 30 Minutes Intravenous  Once 09/01/20 0050 09/01/20 0144      Subjective:  Patient seen and examined at bedside this morning.  Hemodynamically stable.  On room air today.  Denies any worsening shortness of breath or cough.  Feels better today.  Still complaining of discomfort on the right popliteal area.   Objective: Vitals:   09/02/20 0147 09/02/20 0623 09/02/20 0939 09/02/20 1244  BP: 140/75 140/89 (!) 151/93   Pulse: (!) 56 (!) 51 (!) 49   Resp: 16 16    Temp: 97.8 F (36.6 C) 97.7 F (36.5 C)    TempSrc: Oral Oral    SpO2: 96% 94%  94%  Weight:      Height:        Intake/Output Summary (Last 24 hours) at 09/02/2020 1336 Last data filed at 09/02/2020 1300 Gross per 24 hour  Intake 1440 ml  Output 3250 ml  Net -1810 ml   Filed Weights   09/01/20 1151 09/01/20 1427  Weight: (!) 139.7 kg (!) 136.5 kg    Examination:  General exam: Appears calm and comfortable ,Not in distress, morbidly obese HEENT:PERRL,Oral mucosa moist, Ear/Nose normal on gross exam Respiratory system: Bilateral equal air entry, normal vesicular breath sounds, no wheezes or crackles  Cardiovascular system: S1 & S2 heard, RRR. No JVD, murmurs, rubs, gallops or clicks. No pedal edema. Gastrointestinal system: Abdomen is  nondistended, soft and nontender. No organomegaly or masses felt. Normal bowel sounds heard. Central nervous system: Alert and oriented. No focal neurological deficits. Extremities: No edema, no clubbing ,no cyanosis. Skin: No rashes, lesions or ulcers,no icterus ,no pallor  Data Reviewed: I have personally reviewed following labs and imaging studies  CBC: Recent Labs  Lab 08/31/20 1920 09/02/20 0411  WBC 6.0 11.8*  NEUTROABS 2.5 8.0*  HGB 13.3 14.1  HCT 42.4 44.9  MCV 86.5 85.0  PLT 190 854   Basic Metabolic Panel: Recent Labs  Lab 08/31/20 1920 09/01/20 2227 09/02/20 0411  NA 135  --  133*  K 4.3  --  5.3*  CL 97*  --  96*  CO2 32  --  28  GLUCOSE 247* 421* 332*  BUN 8  --  17  CREATININE 0.93  --  1.05*  CALCIUM 8.8*  --  9.3  MG 1.9  --  2.1  GFR: Estimated Creatinine Clearance: 83.2 mL/min (A) (by C-G formula based on SCr of 1.05 mg/dL (H)). Liver Function Tests: Recent Labs  Lab 09/02/20 0411  AST 82*  ALT 63*  ALKPHOS 108  BILITOT 0.6  PROT 8.0  ALBUMIN 3.6   No results for input(s): LIPASE, AMYLASE in the last 168 hours. No results for input(s): AMMONIA in the last 168 hours. Coagulation Profile: No results for input(s): INR, PROTIME in the last 168 hours. Cardiac Enzymes: No results for input(s): CKTOTAL, CKMB, CKMBINDEX, TROPONINI in the last 168 hours. BNP (last 3 results) No results for input(s): PROBNP in the last 8760 hours. HbA1C: Recent Labs    09/01/20 0500  HGBA1C 9.7*   CBG: Recent Labs  Lab 09/01/20 1244 09/01/20 1632 09/01/20 2157 09/02/20 0757 09/02/20 1130  GLUCAP 331* 348* 421* 292* 400*   Lipid Profile: Recent Labs    08/31/20 1920  TRIG 54   Thyroid Function Tests: No results for input(s): TSH, T4TOTAL, FREET4, T3FREE, THYROIDAB in the last 72 hours. Anemia Panel: Recent Labs    09/01/20 0038  FERRITIN 79   Sepsis Labs: Recent Labs  Lab 08/31/20 1920  PROCALCITON <0.10    Recent Results (from the  past 240 hour(s))  Resp Panel by RT-PCR (Flu A&B, Covid) Nasopharyngeal Swab     Status: Abnormal   Collection Time: 08/31/20  6:59 PM   Specimen: Nasopharyngeal Swab; Nasopharyngeal(NP) swabs in vial transport medium  Result Value Ref Range Status   SARS Coronavirus 2 by RT PCR POSITIVE (A) NEGATIVE Final    Comment: RESULT CALLED TO, READ BACK BY AND VERIFIED WITH: BANNO,A RN @2126  ON 08/31/20 JACKSON,K (NOTE) SARS-CoV-2 target nucleic acids are DETECTED.  The SARS-CoV-2 RNA is generally detectable in upper respiratory specimens during the acute phase of infection. Positive results are indicative of the presence of the identified virus, but do not rule out bacterial infection or co-infection with other pathogens not detected by the test. Clinical correlation with patient history and other diagnostic information is necessary to determine patient infection status. The expected result is Negative.  Fact Sheet for Patients: EntrepreneurPulse.com.au  Fact Sheet for Healthcare Providers: IncredibleEmployment.be  This test is not yet approved or cleared by the [place] FDA and  has been authorized for detection and/or diagnosis of SARS-CoV-2 by FDA under an Emergency Use Authorization (EUA).  This EUA will remain in effect (meaning this test ca n be used) for the duration of  the COVID-19 declaration under Section 564(b)(1) of the Act, 21 U.S.C. section 360bbb-3(b)(1), unless the authorization is terminated or revoked sooner.     Influenza A by PCR NEGATIVE NEGATIVE Final   Influenza B by PCR NEGATIVE NEGATIVE Final    Comment: (NOTE) The Xpert Xpress SARS-CoV-2/FLU/RSV plus assay is intended as an aid in the diagnosis of influenza from Nasopharyngeal swab specimens and should not be used as a sole basis for treatment. Nasal washings and aspirates are unacceptable for Xpert Xpress SARS-CoV-2/FLU/RSV testing.  Fact Sheet for  Patients: EntrepreneurPulse.com.au  Fact Sheet for Healthcare Providers: IncredibleEmployment.be  This test is not yet approved or cleared by the [place] FDA and has been authorized for detection and/or diagnosis of SARS-CoV-2 by FDA under an Emergency Use Authorization (EUA). This EUA will remain in effect (meaning this test can be used) for the duration of the COVID-19 declaration under Section 564(b)(1) of the Act, 21 U.S.C. section 360bbb-3(b)(1), unless the authorization is terminated or revoked.  Performed at [facility],  [place] [address], [place], [place] [address]          Radiology Studies: CT Angio Chest PE W/Cm &/Or Wo Cm  Result Date: 09/01/2020 CLINICAL DATA:  PE suspected, high prob Cough and fever.  Leg pain and swelling. EXAM: CT ANGIOGRAPHY CHEST WITH CONTRAST TECHNIQUE: Multidetector CT imaging of the chest was performed using the standard protocol during bolus administration of intravenous contrast. Multiplanar CT image reconstructions and MIPs were obtained to evaluate the vascular anatomy. CONTRAST:  33mL OMNIPAQUE IOHEXOL 350 MG/ML SOLN COMPARISON:  Radiograph earlier today. FINDINGS: Cardiovascular: There are no filling defects within the pulmonary arteries to suggest pulmonary embolus. Fusiform aneurysmal dilatation of the ascending aorta maximal dimension 4.4 cm. No evidence of acute aortic findings. Minimal aortic atherosclerosis. Conventional branching pattern from the aortic arch. Borderline cardiomegaly. No pericardial effusion. Mediastinum/Nodes: Prominent right hilar lymph nodes, largest measuring 15 mm. There are small left hilar lymph nodes that are subcentimeter short axis. 9 mm AP window node. Patulous esophagus without wall thickening. Enlarged right lobe of the thyroid gland with question of 2.7 cm nodule. No axillary adenopathy. Lungs/Pleura: Mild apical predominant emphysema. Central bronchial  thickening. Breathing motion artifact limits detailed parenchymal assessment. No focal consolidation. No pulmonary mass or suspicious nodule. No pleural fluid. Upper Abdomen: No acute findings.  Cholecystectomy. Musculoskeletal: Diffuse thoracic spondylosis with endplate spurring. There are no acute or suspicious osseous abnormalities. Review of the MIP images confirms the above findings. IMPRESSION: 1. No pulmonary embolus. 2. Fusiform aneurysmal dilatation of the ascending aorta maximal dimension 4.4 cm. No acute aortic findings. Recommend annual imaging followup by CTA or MRA. This recommendation follows 2010 ACCF/AHA/AATS/ACR/ASA/SCA/SCAI/SIR/STS/SVM Guidelines for the Diagnosis and Management of Patients with Thoracic Aortic Disease. Circulation. 2010; [id number]. Aortic aneurysm NOS (ICD10-I71.9) 3. Prominent right hilar and AP window lymph nodes are likely reactive. 4. Mild emphysema and bronchial thickening. No focal airspace disease. 5. Enlarged right lobe of the thyroid gland with question of 2.7 cm nodule. Recommend thyroid US (ref: J Am Coll Radiol. 2015 Feb;12(2): 143-50). Aortic Atherosclerosis (ICD10-I70.0) and Emphysema (ICD10-J43.9). Electronically Signed   By: Keith Rake M.D.   On: 09/01/2020 00:10   DG Chest Port 1 View  Result Date: 08/31/2020 CLINICAL DATA:  Fever, cough. EXAM: PORTABLE CHEST 1 VIEW COMPARISON:  April 18, 2020. FINDINGS: The heart size and mediastinal contours are within normal limits. Both lungs are clear. The visualized skeletal structures are unremarkable. IMPRESSION: No active disease. Electronically Signed   By: Marijo Conception M.D.   On: 08/31/2020 21:10   VAS [place] LOWER EXTREMITY VENOUS (DVT)  Result Date: 09/02/2020  Lower Venous DVT Study Patient Name:  Tammy Strickland  Date of Exam:   09/02/2020 Medical Rec #: [id number]       Accession #:    [id number] Date of Birth: October 18, 1960      Patient Gender: F Patient Age:   44Y Exam Location:  [facility] Procedure:      VAS [place] LOWER EXTREMITY VENOUS (DVT) Referring Phys: [id number] Binyamin Nelis --------------------------------------------------------------------------------  Indications: Bilateral posterior thigh pain & Covid+.  Comparison Study: Previous 05/07/17 - negative Performing Technologist: Rogelia Rohrer  Examination Guidelines: A complete evaluation includes B-mode imaging, spectral Doppler, color Doppler, and power Doppler as needed of all accessible portions of each vessel. Bilateral testing is considered an integral part of a complete examination. Limited examinations for reoccurring indications may be performed as noted. The reflux portion of the exam is performed with the patient  in reverse Trendelenburg.  +---------+---------------+---------+-----------+----------+--------------+ RIGHT    CompressibilityPhasicitySpontaneityPropertiesThrombus Aging +---------+---------------+---------+-----------+----------+--------------+ CFV      Full           Yes      Yes                                 +---------+---------------+---------+-----------+----------+--------------+ SFJ      Full                                                        +---------+---------------+---------+-----------+----------+--------------+ FV Prox  Full           Yes      Yes                                 +---------+---------------+---------+-----------+----------+--------------+ FV Mid   Full           Yes      Yes                                 +---------+---------------+---------+-----------+----------+--------------+ FV DistalFull           Yes      Yes                                 +---------+---------------+---------+-----------+----------+--------------+ PFV      Full                                                        +---------+---------------+---------+-----------+----------+--------------+ POP      Full           Yes      Yes                                  +---------+---------------+---------+-----------+----------+--------------+ PTV      Full                                                        +---------+---------------+---------+-----------+----------+--------------+ PERO     Full                                                        +---------+---------------+---------+-----------+----------+--------------+   +---------+---------------+---------+-----------+----------+--------------+ LEFT     CompressibilityPhasicitySpontaneityPropertiesThrombus Aging +---------+---------------+---------+-----------+----------+--------------+ CFV      Full           Yes      Yes                                 +---------+---------------+---------+-----------+----------+--------------+  SFJ      Full                                                        +---------+---------------+---------+-----------+----------+--------------+ FV Prox  Full           Yes      Yes                                 +---------+---------------+---------+-----------+----------+--------------+ FV Mid   Full           Yes      Yes                                 +---------+---------------+---------+-----------+----------+--------------+ FV DistalFull           Yes      Yes                                 +---------+---------------+---------+-----------+----------+--------------+ PFV      Full                                                        +---------+---------------+---------+-----------+----------+--------------+ POP      Full           Yes      Yes                                 +---------+---------------+---------+-----------+----------+--------------+ PTV      Full                                                        +---------+---------------+---------+-----------+----------+--------------+ PERO     Full                                                         +---------+---------------+---------+-----------+----------+--------------+     Summary: BILATERAL: - No evidence of deep vein thrombosis seen in the lower extremities, bilaterally. - No evidence of superficial venous thrombosis in the lower extremities, bilaterally. -No evidence of popliteal cyst, bilaterally.   *See table(s) above for measurements and observations.    Preliminary         Scheduled Meds: . amitriptyline  75 mg Oral QHS  . vitamin C  500 mg Oral Daily  . aspirin EC  81 mg Oral Daily  . atorvastatin  20 mg Oral Daily  . cloNIDine  0.3 mg Oral Daily  . enoxaparin (LOVENOX) injection  138 mg Subcutaneous Q12H  . fluticasone  1 spray Each Nare Daily  . fluticasone furoate-vilanterol  1 puff Inhalation Daily  .  furosemide  80 mg Oral Daily  . gabapentin  600 mg Oral TID  . insulin aspart  0-20 Units Subcutaneous TID AC & HS  . insulin aspart  25 Units Subcutaneous TID WC  . [START ON 09/03/2020] insulin glargine  150 Units Subcutaneous Daily  . losartan  50 mg Oral BID  . methocarbamol  500 mg Oral QID  . methylPREDNISolone (SOLU-MEDROL) injection  70 mg Intravenous Q12H   Followed by  . [START ON 09/04/2020] predniSONE  50 mg Oral Daily  . metoprolol  200 mg Oral BID  . pantoprazole  40 mg Oral BID AC  . zinc sulfate  220 mg Oral Daily   Continuous Infusions: . remdesivir 100 mg in NS 100 mL 100 mg (09/02/20 0949)     LOS: 1 day    Time spent: 35 mins.More than 50% of that time was spent in counseling and/or coordination of care.      Shelly Coss, MD [facility] P5/23/2022, 1:36 PM

## 2020-09-02 NOTE — Telephone Encounter (Signed)
Pt has been in the hospital for covid while in the hospital blood sugars have been running high. An app was made for 11/04/2020 but pt needs to be seen sooner. Blood sugars have been running [phone] pt would like to be seen sooner if anyway.

## 2020-09-03 DIAGNOSIS — U071 COVID-19: Secondary | ICD-10-CM | POA: Diagnosis not present

## 2020-09-03 LAB — CBC WITH DIFFERENTIAL/PLATELET
Abs Immature Granulocytes: 0.05 10*3/uL (ref 0.00–0.07)
Basophils Absolute: 0 10*3/uL (ref 0.0–0.1)
Basophils Relative: 0 %
Eosinophils Absolute: 0 10*3/uL (ref 0.0–0.5)
Eosinophils Relative: 0 %
HCT: 43.6 % (ref 36.0–46.0)
Hemoglobin: 13.8 g/dL (ref 12.0–15.0)
Immature Granulocytes: 0 %
Lymphocytes Relative: 33 %
Lymphs Abs: 4.4 10*3/uL — ABNORMAL HIGH (ref 0.7–4.0)
MCH: 27.1 pg (ref 26.0–34.0)
MCHC: 31.7 g/dL (ref 30.0–36.0)
MCV: 85.7 fL (ref 80.0–100.0)
Monocytes Absolute: 1.3 10*3/uL — ABNORMAL HIGH (ref 0.1–1.0)
Monocytes Relative: 10 %
Neutro Abs: 7.5 10*3/uL (ref 1.7–7.7)
Neutrophils Relative %: 57 %
Platelets: 216 10*3/uL (ref 150–400)
RBC: 5.09 MIL/uL (ref 3.87–5.11)
RDW: 15.1 % (ref 11.5–15.5)
WBC: 13.3 10*3/uL — ABNORMAL HIGH (ref 4.0–10.5)
nRBC: 0 % (ref 0.0–0.2)

## 2020-09-03 LAB — COMPREHENSIVE METABOLIC PANEL
ALT: 45 U/L — ABNORMAL HIGH (ref 0–44)
AST: 38 U/L (ref 15–41)
Albumin: 3.2 g/dL — ABNORMAL LOW (ref 3.5–5.0)
Alkaline Phosphatase: 95 U/L (ref 38–126)
Anion gap: 6 (ref 5–15)
BUN: 21 mg/dL — ABNORMAL HIGH (ref 6–20)
CO2: 32 mmol/L (ref 22–32)
Calcium: 9 mg/dL (ref 8.9–10.3)
Chloride: 97 mmol/L — ABNORMAL LOW (ref 98–111)
Creatinine, Ser: 0.91 mg/dL (ref 0.44–1.00)
GFR, Estimated: >60 mL/min (ref >60)
Glucose, Bld: 251 mg/dL — ABNORMAL HIGH (ref 70–99)
Potassium: 4.4 mmol/L (ref 3.5–5.1)
Sodium: 135 mmol/L (ref 135–145)
Total Bilirubin: 0.4 mg/dL (ref 0.3–1.2)
Total Protein: 7.3 g/dL (ref 6.5–8.1)

## 2020-09-03 LAB — GLUCOSE, CAPILLARY
Glucose-Capillary: 152 mg/dL — ABNORMAL HIGH (ref 70–99)
Glucose-Capillary: 210 mg/dL — ABNORMAL HIGH (ref 70–99)

## 2020-09-03 LAB — MAGNESIUM: Magnesium: 2.2 mg/dL (ref 1.7–2.4)

## 2020-09-03 LAB — C-REACTIVE PROTEIN: CRP: 0.7 mg/dL (ref <1.0)

## 2020-09-03 LAB — D-DIMER, QUANTITATIVE: D-Dimer, Quant: <0.27 ug/mL-FEU (ref 0.00–0.50)

## 2020-09-03 MED ORDER — PREDNISONE 20 MG PO TABS: 40.0000 mg | ORAL_TABLET | Freq: Every day | ORAL | 0 refills | Status: AC

## 2020-09-03 MED ORDER — ZINC SULFATE 220 (50 ZN) MG PO CAPS: 220.0000 mg | ORAL_CAPSULE | Freq: Every day | ORAL | 0 refills | Status: DC

## 2020-09-03 MED ORDER — ASCORBIC ACID 500 MG PO TABS: 500.0000 mg | ORAL_TABLET | Freq: Every day | ORAL | 0 refills | Status: DC

## 2020-09-03 MED ORDER — SULFAMETHOXAZOLE-TRIMETHOPRIM 400-80 MG PO TABS: 1.0000 | ORAL_TABLET | Freq: Two times a day (BID) | ORAL | 0 refills | Status: AC

## 2020-09-03 MED ORDER — METOPROLOL SUCCINATE ER 100 MG PO TB24
200.0000 mg | ORAL_TABLET | Freq: Every day | ORAL | Status: DC
  Administered 2020-09-03: 200 mg via ORAL
  Filled 2020-09-03: qty 2

## 2020-09-03 MED ORDER — GUAIFENESIN-DM 100-10 MG/5ML PO SYRP: 10.0000 mL | ORAL_SOLUTION | ORAL | 0 refills | Status: DC | PRN

## 2020-09-03 MED ORDER — METOPROLOL SUCCINATE ER 200 MG PO TB24: 200.0000 mg | ORAL_TABLET | Freq: Every day | ORAL | 11 refills | Status: DC

## 2020-09-03 NOTE — Discharge Summary (Addendum)
Physician Discharge Summary  Tammy Strickland MLJ:[id number] DOB: 1960-11-25 DOA: 08/31/2020  PCP: Binnie Rail, MD  Admit date: 08/31/2020 Discharge date: 09/03/2020  Admitted From: Home Disposition:  Home  Discharge Condition:Stable CODE STATUS:FULL Diet recommendation: Carb Modified  Brief/Interim Summary:  Patient is a 60 year old female with history of diabetes type 2, coronaryarterydisease, hypertension, COPD, OSA, diabetic polyneuropathy, diastolic congestive heart failure, hyperlipidemia, and nicotine dependence who presented with cough, congestion, pain behind the right knee/rightcalf. She is unvaccinated against COVID. On presentation she was found to be hypoxic and had to be put on nasal cannula for the maintenance of saturation. D-dimer was found to be elevated, CT angiogram of the chest was negative for PE but found a 4.4 cm ascending thoracic aortic aneurysm without dissection. COVID screen test came out to be positive. Patient was admitted for acute hypoxic respiratoryfailure secondary to COVID illness and is started on remdesivir and Solu-Medrol. D-dimer was elevated which could be also from COVID infection.This morning she has been on room air, denies any worsening shortness of breath or cough.  She is medically stable for discharge to home today with steroids.  She finished 3 days course of remdesivir.  Following problems were addressed during hospitalization:  COVID illness/acute hypoxic respiratory failure: Presented with 3-day history of increasing shortness of breath, cough.  She is unvaccinated.  COVID screen test came out to be positive.  X-ray done in the emergency department did not show pneumonia but she was hypoxic saturating 80s in the room air.  Oxygen saturation improved with supplemental oxygen.  Started on IV steroid and remdesivir.  Procalcitonin normal. This morning she was on room air.  Continue zinc and vitamin C,  Antitussives. She finished 3 days  course of remdesivir.  On discharge, she has been prescribed oral steroids for 5 days.  Right knee pain: Complains of pain mainly on the right popliteal area.  Venous Doppler was negative for DVT.  Knee x-ray showed osteoarthritis.    Uncontrolled diabetes: Hemoglobin A1c of 9.7.  Takes high-dose of insulin at home.  Diabetic coordinator following.   She follows with endocrinology, Dr. Loanne Drilling.  She is compliant with insulin.  Hypertension: Currently blood stable.  Continue  home medications  COPD: She was wheezing on presentation.  Resolved now  Chronic diastolic congestive heart failure: Currently euvolemic.  Takes Lasix 80 mg daily at home.  Nicotine dependence: Smokes 6 cigarettes a day.  Counseled for cessation.  GERD: Continue PPI  Ascending thoracic aortic aneurysm: CT angiogram of the chest was negative for PE but found a 4.4 cm ascending thoracic aortic aneurysm without dissection.  Outpatient follow-up should be done by doing a CT angiogram in a week.  Debility/deconditioning: Has ambulatory problems.  Uses cane.    No follow-up recommended as per PT.  Morbid obesity: BMI 48.5    Discharge Diagnoses:  Principal Problem:   COVID-19 virus infection Active Problems:   Benign essential HTN   COPD (chronic obstructive pulmonary disease) (HCC)   GERD (gastroesophageal reflux disease)   Chronic diastolic congestive heart failure ([place])   Uncontrolled type 2 diabetes mellitus with hyperglycemia, with long-term current use of insulin (HCC)   Nicotine dependence, cigarettes, uncomplicated   Thoracic ascending aortic aneurysm [facility])    Discharge Instructions  Discharge Instructions    Diet Carb Modified   Complete by: As directed    Discharge instructions   Complete by: As directed    1)Please take prescribed medications as instructed 2)Follow up with your PCP in  2 weeks. 3)Infection Prevention Recommendations for Individuals Confirmed to have, or Being Evaluated  for, 2019 Novel Coronavirus (COVID-19) Infection Who Receive Care at Home Individuals who are confirmed to have, or are being evaluated for, COVID-19 should follow the prevention steps below until a healthcare provider or local or state health department says they can return to normal activities.  Stay home except to get medical care You should restrict activities outside your home, except for getting medical care. Do not go to work, school, or public areas, and do not use public transportation or taxis.  Call ahead before visiting your doctor Before your medical appointment, call the healthcare provider and tell them that you have, or are being evaluated for, COVID-19 infection. This will help the healthcare provider's office take steps to keep other people from getting infected. Ask your healthcare provider to call the local or state health department.  Monitor your symptoms Seek prompt medical attention if your illness is worsening (e.g., difficulty breathing). Before going to your medical appointment, call the healthcare provider and tell them that you have, or are being evaluated for, COVID-19 infection. Ask your healthcare provider to call the local or state health department.  Wear a facemask You should wear a facemask that covers your nose and mouth when you are in the same room with other people and when you visit a healthcare provider. People who live with or visit you should also wear a facemask while they are in the same room with you.  Separate yourself from other people in your home As much as possible, you should stay in a different room from other people in your home. Also, you should use a separate bathroom, if available.  Avoid sharing household items You should not share dishes, drinking glasses, cups, eating utensils, towels, bedding, or other items with other people in your home. After using these items, you should wash them thoroughly with soap and  water.  Cover your coughs and sneezes Cover your mouth and nose with a tissue when you cough or sneeze, or you can cough or sneeze into your sleeve. Throw used tissues in a lined trash can, and immediately wash your hands with soap and water for at least 20 seconds or use an alcohol-based hand rub.  Wash your [facility] your hands often and thoroughly with soap and water for at least 20 seconds. You can use an alcohol-based hand sanitizer if soap and water are not available and if your hands are not visibly dirty. Avoid touching your eyes, nose, and mouth with unwashed hands.   Prevention Steps for Caregivers and Household Members of Individuals Confirmed to have, or Being Evaluated for, COVID-19 Infection Being Cared for in the Home  If you live with, or provide care at home for, a person confirmed to have, or being evaluated for, COVID-19 infection please follow these guidelines to prevent infection:  Follow healthcare provider's instructions Make sure that you understand and can help the patient follow any healthcare provider instructions for all care.  Provide for the patient's basic needs You should help the patient with basic needs in the home and provide support for getting groceries, prescriptions, and other personal needs.  Monitor the patient's symptoms If they are getting sicker, call his or her medical provider and tell them that the patient has, or is being evaluated for, COVID-19 infection. This will help the healthcare provider's office take steps to keep other people from getting infected. Ask the healthcare provider to call the local  or state health department.  Limit the number of people who have contact with the patient If possible, have only one caregiver for the patient. Other household members should stay in another home or place of residence. If this is not possible, they should stay in another room, or be separated from the patient as much as  possible. Use a separate bathroom, if available. Restrict visitors who do not have an essential need to be in the home.  Keep older adults, very young children, and other sick people away from the patient Keep older adults, very young children, and those who have compromised immune systems or chronic health conditions away from the patient. This includes people with chronic heart, lung, or kidney conditions, diabetes, and cancer.  Ensure good ventilation Make sure that shared spaces in the home have good air flow, such as from an air conditioner or an opened window, weather permitting.  Wash your hands often Wash your hands often and thoroughly with soap and water for at least 20 seconds. You can use an alcohol based hand sanitizer if soap and water are not available and if your hands are not visibly dirty. Avoid touching your eyes, nose, and mouth with unwashed hands. Use disposable paper towels to dry your hands. If not available, use dedicated cloth towels and replace them when they become wet.  Wear a facemask and gloves Wear a disposable facemask at all times in the room and gloves when you touch or have contact with the patient's blood, body fluids, and/or secretions or excretions, such as sweat, saliva, sputum, nasal mucus, vomit, urine, or feces. Ensure the mask fits over your nose and mouth tightly, and do not touch it during use. Throw out disposable facemasks and gloves after using them. Do not reuse. Wash your hands immediately after removing your facemask and gloves. If your personal clothing becomes contaminated, carefully remove clothing and launder. Wash your hands after handling contaminated clothing. Place all used disposable facemasks, gloves, and other waste in a lined container before disposing them with other household waste. Remove gloves and wash your hands immediately after handling these items.  Do not share dishes, glasses, or other household items with the  patient Avoid sharing household items. You should not share dishes, drinking glasses, cups, eating utensils, towels, bedding, or other items with a patient who is confirmed to have, or being evaluated for, COVID-19 infection. After the person uses these items, you should wash them thoroughly with soap and water.  Wash laundry thoroughly Immediately remove and wash clothes or bedding that have blood, body fluids, and/or secretions or excretions, such as sweat, saliva, sputum, nasal mucus, vomit, urine, or feces, on them. Wear gloves when handling laundry from the patient. Read and follow directions on labels of laundry or clothing items and detergent. In general, wash and dry with the warmest temperatures recommended on the label.  Clean all areas the individual has used often Clean all touchable surfaces, such as counters, tabletops, doorknobs, bathroom fixtures, toilets, phones, keyboards, tablets, and bedside tables, every day. Also, clean any surfaces that may have blood, body fluids, and/or secretions or excretions on them. Wear gloves when cleaning surfaces the patient has come in contact with. Use a diluted bleach solution (e.g., dilute bleach with 1 part bleach and 10 parts water) or a household disinfectant with a label that says EPA-registered for coronaviruses. To make a bleach solution at home, add 1 tablespoon of bleach to 1 quart (4 cups) of water. For a larger  supply, add  cup of bleach to 1 gallon (16 cups) of water. Read labels of cleaning products and follow recommendations provided on product labels. Labels contain instructions for safe and effective use of the cleaning product including precautions you should take when applying the product, such as wearing gloves or eye protection and making sure you have good ventilation during use of the product. Remove gloves and wash hands immediately after cleaning.  Monitor yourself for signs and symptoms of illness Caregivers and  household members are considered close contacts, should monitor their health, and will be asked to limit movement outside of the home to the extent possible. Follow the monitoring steps for close contacts listed on the symptom monitoring form. 4)You have been found to have thoracic ascending aortic aneurysm.  Do a CT angiogram in a year for follow-up.   Increase activity slowly   Complete by: As directed      Allergies as of 09/03/2020      Reactions   Metformin And Related Other (See Comments)   Lactic acidosis   Other Other (See Comments)   H/o pancreatitis   Ozempic (0.25 Or 0.5 Mg-dose) [semaglutide(0.25 Or 0.5mg -dos)]    H/o pancreatitis   Bacitracin Hives      Medication List    STOP taking these medications   erythromycin ophthalmic ointment   fluconazole 150 MG tablet Commonly known as: DIFLUCAN   potassium chloride SA 20 MEQ tablet Commonly known as: KLOR-CON     TAKE these medications   Accu-Chek Guide test strip Generic drug: glucose blood TEST FOUR TIMES DAILY What changed:   how much to take  how to take this  additional instructions   Accu-Chek Guide w/Device Kit 1 each by Does not apply route 4 (four) times daily. E11.65   Accu-Chek Softclix Lancets lancets 1 each by Other route 4 (four) times daily. E11.65   amitriptyline 75 MG tablet Commonly known as: ELAVIL Take 1 tablet by mouth at bedtime   ascorbic acid 500 MG tablet Commonly known as: VITAMIN C Take 1 tablet (500 mg total) by mouth daily. Start taking on: Sep 04, 2020   aspirin EC 81 MG tablet Take 1 tablet (81 mg total) by mouth daily.   atorvastatin 20 MG tablet Commonly known as: LIPITOR Take 1 tablet by mouth every day   budesonide-formoterol 80-4.5 MCG/ACT inhaler Commonly known as: Symbicort Inhale 2 puffs into the lungs as needed (wheezing/SOB).   cloNIDine 0.3 MG tablet Commonly known as: CATAPRES Take 1 tablet by mouth every day   fluticasone 50 MCG/ACT nasal  spray Commonly known as: FLONASE Shake liquid & use 1 spray into each nostril every day as needed for allergies What changed: See the new instructions.   furosemide 80 MG tablet Commonly known as: LASIX Take 1 tablet by mouth every day   gabapentin 600 MG tablet Commonly known as: NEURONTIN Take 2 tablets by mouth 3 times a day, need appt What changed: See the new instructions.   Glucerna Hunger Smart Shake Liqd Take 1 each by mouth 2 (two) times daily.   guaiFENesin-dextromethorphan 100-10 MG/5ML syrup Commonly known as: ROBITUSSIN DM Take 10 mLs by mouth every 4 (four) hours as needed for cough.   hydrOXYzine 10 MG tablet Commonly known as: ATARAX/VISTARIL Take 10 mg by mouth 2 (two) times daily.   losartan 50 MG tablet Commonly known as: COZAAR Take 1 tablet by mouth twice daily   meloxicam 15 MG tablet Commonly known as: MOBIC Take 15  mg by mouth daily.   methocarbamol 500 MG tablet Commonly known as: Robaxin Take 1 tablet (500 mg total) by mouth 4 (four) times daily.   metoprolol 200 MG 24 hr tablet Commonly known as: TOPROL-XL Take 1 tablet (200 mg total) by mouth daily. What changed: when to take this   Narcan 4 MG/0.1ML Liqd nasal spray kit Generic drug: naloxone Place 1 spray into the nose once.   nitroGLYCERIN 0.4 MG SL tablet Commonly known as: NITROSTAT Place 0.4 mg under the tongue every 5 (five) minutes as needed for chest pain.   oxyCODONE-acetaminophen 10-325 MG tablet Commonly known as: PERCOCET Take 1 tablet by mouth every 4 (four) hours as needed for pain.   pantoprazole 40 MG tablet Commonly known as: PROTONIX Take one tablet by mouth twice daily (am and pm) What changed: See the new instructions.   Pen Needles 32G X 5 MM Misc UAD daily for insulin   predniSONE 20 MG tablet Commonly known as: DELTASONE Take 2 tablets (40 mg total) by mouth daily with breakfast for 4 days. Start taking on: Sep 04, 2020    sulfamethoxazole-trimethoprim 400-80 MG tablet Commonly known as: BACTRIM Take 1 tablet by mouth 2 (two) times daily for 5 days.   tiZANidine 4 MG tablet Commonly known as: ZANAFLEX Take 4 mg by mouth at bedtime.   Toujeo Max SoloStar 300 UNIT/ML Solostar Pen Generic drug: insulin glargine (2 Unit Dial) Inject 170 Units into the skin every morning. And pen needles 1/day What changed:   how much to take  when to take this  additional instructions   zinc sulfate 220 (50 Zn) MG capsule Take 1 capsule (220 mg total) by mouth daily. Start taking on: Sep 04, 2020   ZTlido 1.8 % Ptch Generic drug: Lidocaine Apply 1 patch topically daily as needed (knee pain).            Durable Medical Equipment  (From admission, onward)         Start     Ordered   09/03/20 1153  For home use only DME Walker rolling  Once       Question Answer Comment  Walker: With 5 Inch Wheels   Patient needs a walker to treat with the following condition Balance disorder      09/03/20 1152          Allergies  Allergen Reactions  . Metformin And Related Other (See Comments)    Lactic acidosis  . Other Other (See Comments)    H/o pancreatitis  . Ozempic (0.25 Or 0.5 Mg-Dose) [Semaglutide(0.25 Or 0.5mg -Dos)]     H/o pancreatitis  . Bacitracin Hives    Consultations:  None   Procedures/Studies: DG Knee 1-2 Views Right  Result Date: 09/02/2020 CLINICAL DATA:  Right knee pain.  Popliteal pain. EXAM: RIGHT KNEE - 1-2 VIEW COMPARISON:  01/16/2019 FINDINGS: Medial compartment joint space narrowing. Tricompartmental peripheral spurring. There is a trace joint effusion. No erosion, bony destruction or evidence of avascular necrosis. Trace joint effusion. Mild generalized soft tissue edema. IMPRESSION: Mild tricompartmental osteoarthritis, most prominent in the medial compartment. Electronically Signed   By: Keith Rake M.D.   On: 09/02/2020 15:00   CT Angio Chest PE W/Cm &/Or Wo  Cm  Result Date: 09/01/2020 CLINICAL DATA:  PE suspected, high prob Cough and fever.  Leg pain and swelling. EXAM: CT ANGIOGRAPHY CHEST WITH CONTRAST TECHNIQUE: Multidetector CT imaging of the chest was performed using the standard protocol during bolus administration of  intravenous contrast. Multiplanar CT image reconstructions and MIPs were obtained to evaluate the vascular anatomy. CONTRAST:  18m OMNIPAQUE IOHEXOL 350 MG/ML SOLN COMPARISON:  Radiograph earlier today. FINDINGS: Cardiovascular: There are no filling defects within the pulmonary arteries to suggest pulmonary embolus. Fusiform aneurysmal dilatation of the ascending aorta maximal dimension 4.4 cm. No evidence of acute aortic findings. Minimal aortic atherosclerosis. Conventional branching pattern from the aortic arch. Borderline cardiomegaly. No pericardial effusion. Mediastinum/Nodes: Prominent right hilar lymph nodes, largest measuring 15 mm. There are small left hilar lymph nodes that are subcentimeter short axis. 9 mm AP window node. Patulous esophagus without wall thickening. Enlarged right lobe of the thyroid gland with question of 2.7 cm nodule. No axillary adenopathy. Lungs/Pleura: Mild apical predominant emphysema. Central bronchial thickening. Breathing motion artifact limits detailed parenchymal assessment. No focal consolidation. No pulmonary mass or suspicious nodule. No pleural fluid. Upper Abdomen: No acute findings.  Cholecystectomy. Musculoskeletal: Diffuse thoracic spondylosis with endplate spurring. There are no acute or suspicious osseous abnormalities. Review of the MIP images confirms the above findings. IMPRESSION: 1. No pulmonary embolus. 2. Fusiform aneurysmal dilatation of the ascending aorta maximal dimension 4.4 cm. No acute aortic findings. Recommend annual imaging followup by CTA or MRA. This recommendation follows 2010 ACCF/AHA/AATS/ACR/ASA/SCA/SCAI/SIR/STS/SVM Guidelines for the Diagnosis and Management of Patients  with Thoracic Aortic Disease. Circulation. 2010; 121:: Q333-L456 Aortic aneurysm NOS (ICD10-I71.9) 3. Prominent right hilar and AP window lymph nodes are likely reactive. 4. Mild emphysema and bronchial thickening. No focal airspace disease. 5. Enlarged right lobe of the thyroid gland with question of 2.7 cm nodule. Recommend thyroid [place](ref: J Am Coll Radiol. 2015 Feb;12(2): 143-50). Aortic Atherosclerosis (ICD10-I70.0) and Emphysema (ICD10-J43.9). Electronically Signed   By: MKeith RakeM.D.   On: 09/01/2020 00:10   DG Chest Port 1 View  Result Date: 08/31/2020 CLINICAL DATA:  Fever, cough. EXAM: PORTABLE CHEST 1 VIEW COMPARISON:  April 18, 2020. FINDINGS: The heart size and mediastinal contours are within normal limits. Both lungs are clear. The visualized skeletal structures are unremarkable. IMPRESSION: No active disease. Electronically Signed   By: JMarijo ConceptionM.D.   On: 08/31/2020 21:10   VAS [place] EXTREMITY VENOUS (DVT)  Result Date: 09/02/2020  Lower Venous DVT Study Patient Name:  Tammy Strickland Date of Exam:   09/02/2020 Medical Rec #: [id number]      Accession #:    [id number] of Birth: 1December 24, 1962     Patient Gender: F Patient Age:   047YEx[facility]:      VAS [place]OWER EXTREMITY VENOUS (DVT) Referring Phys: [id number] Zabrina Brotherton --------------------------------------------------------------------------------  Indications: Bilateral posterior thigh pain & Covid+.  Comparison Study: Previous 05/07/17 - negative Performing Technologist: JRogelia Rohrer Examination Guidelines: A complete evaluation includes B-mode imaging, spectral Doppler, color Doppler, and power Doppler as needed of all accessible portions of each vessel. Bilateral testing is considered an integral part of a complete examination. Limited examinations for reoccurring indications may be performed as noted. The reflux portion of the exam is performed with the patient in reverse  Trendelenburg.  +---------+---------------+---------+-----------+----------+--------------+ RIGHT    CompressibilityPhasicitySpontaneityPropertiesThrombus Aging +---------+---------------+---------+-----------+----------+--------------+ CFV      Full           Yes      Yes                                 +---------+---------------+---------+-----------+----------+--------------+  SFJ      Full                                                        +---------+---------------+---------+-----------+----------+--------------+ FV Prox  Full           Yes      Yes                                 +---------+---------------+---------+-----------+----------+--------------+ FV Mid   Full           Yes      Yes                                 +---------+---------------+---------+-----------+----------+--------------+ FV DistalFull           Yes      Yes                                 +---------+---------------+---------+-----------+----------+--------------+ PFV      Full                                                        +---------+---------------+---------+-----------+----------+--------------+ POP      Full           Yes      Yes                                 +---------+---------------+---------+-----------+----------+--------------+ PTV      Full                                                        +---------+---------------+---------+-----------+----------+--------------+ PERO     Full                                                        +---------+---------------+---------+-----------+----------+--------------+   +---------+---------------+---------+-----------+----------+--------------+ LEFT     CompressibilityPhasicitySpontaneityPropertiesThrombus Aging +---------+---------------+---------+-----------+----------+--------------+ CFV      Full           Yes      Yes                                  +---------+---------------+---------+-----------+----------+--------------+ SFJ      Full                                                        +---------+---------------+---------+-----------+----------+--------------+  FV Prox  Full           Yes      Yes                                 +---------+---------------+---------+-----------+----------+--------------+ FV Mid   Full           Yes      Yes                                 +---------+---------------+---------+-----------+----------+--------------+ FV DistalFull           Yes      Yes                                 +---------+---------------+---------+-----------+----------+--------------+ PFV      Full                                                        +---------+---------------+---------+-----------+----------+--------------+ POP      Full           Yes      Yes                                 +---------+---------------+---------+-----------+----------+--------------+ PTV      Full                                                        +---------+---------------+---------+-----------+----------+--------------+ PERO     Full                                                        +---------+---------------+---------+-----------+----------+--------------+     Summary: BILATERAL: - No evidence of deep vein thrombosis seen in the lower extremities, bilaterally. - No evidence of superficial venous thrombosis in the lower extremities, bilaterally. -No evidence of popliteal cyst, bilaterally.   *See table(s) above for measurements and observations. Electronically signed by Monica Martinez MD on 09/02/2020 at 3:31:29 PM.    Final       Subjective:  Patient seen and examined at the bedside this morning.  Hemodynamically stable for discharge today.  Discharge Exam: Vitals:   09/03/20 0512 09/03/20 0908  BP: (!) 155/82 (!) 142/95  Pulse: (!) 47 (!) 52  Resp: 18   Temp: 97.9 F (36.6 C)    SpO2: 92% 92%   Vitals:   09/02/20 1408 09/02/20 2024 09/03/20 0512 09/03/20 0908  BP: 136/74 (!) 132/96 (!) 155/82 (!) 142/95  Pulse: (!) 52 (!) 50 (!) 47 (!) 52  Resp: _0 Temp:  98.1 F (36.7 C) 97.9 F (36.6 C)   TempSrc:  Oral Oral   SpO2: 94% 97% 92% 92%  Weight:      Height:  General: Pt is alert, awake, not in acute distress, obese Cardiovascular: RRR, S1/S2 +, no rubs, no gallops Respiratory: CTA bilaterally, no wheezing, no rhonchi Abdominal: Soft, NT, ND, bowel sounds + Extremities: no edema, no cyanosis    The results of significant diagnostics from this hospitalization (including imaging, microbiology, ancillary and laboratory) are listed below for reference.     Microbiology: Recent Results (from the past 240 hour(s))  Resp Panel by RT-PCR (Flu A&B, Covid) Nasopharyngeal Swab     Status: Abnormal   Collection Time: 08/31/20  6:59 PM   Specimen: Nasopharyngeal Swab; Nasopharyngeal(NP) swabs in vial transport medium  Result Value Ref Range Status   SARS Coronavirus 2 by RT PCR POSITIVE (A) NEGATIVE Final    Comment: RESULT CALLED TO, READ BACK BY AND VERIFIED WITH: BANNO,A RN _0  ON 08/31/20 JACKSON,K (NOTE) SARS-CoV-2 target nucleic acids are DETECTED.  The SARS-CoV-2 RNA is generally detectable in upper respiratory specimens during the acute phase of infection. Positive results are indicative of the presence of the identified virus, but do not rule out bacterial infection or co-infection with other pathogens not detected by the test. Clinical correlation with patient history and other diagnostic information is necessary to determine patient infection status. The expected result is Negative.  Fact Sheet for Patients: EntrepreneurPulse.com.au  Fact Sheet for Healthcare Providers: IncredibleEmployment.be  This test is not yet approved or cleared by the [place] FDA and  has been authorized for  detection and/or diagnosis of SARS-CoV-2 by FDA under an Emergency Use Authorization (EUA).  This EUA will remain in effect (meaning this test ca n be used) for the duration of  the COVID-19 declaration under Section 564(b)(1) of the Act, 21 U.S.C. section 360bbb-3(b)(1), unless the authorization is terminated or revoked sooner.     Influenza A by PCR NEGATIVE NEGATIVE Final   Influenza B by PCR NEGATIVE NEGATIVE Final    Comment: (NOTE) The Xpert Xpress SARS-CoV-2/FLU/RSV plus assay is intended as an aid in the diagnosis of influenza from Nasopharyngeal swab specimens and should not be used as a sole basis for treatment. Nasal washings and aspirates are unacceptable for Xpert Xpress SARS-CoV-2/FLU/RSV testing.  Fact Sheet for Patients: EntrepreneurPulse.com.au  Fact Sheet for Healthcare Providers: IncredibleEmployment.be  This test is not yet approved or cleared by the [place] FDA and has been authorized for detection and/or diagnosis of SARS-CoV-2 by FDA under an Emergency Use Authorization (EUA). This EUA will remain in effect (meaning this test can be used) for the duration of the COVID-19 declaration under Section 564(b)(1) of the Act, 21 U.S.C. section 360bbb-3(b)(1), unless the authorization is terminated or revoked.  Performed at [facility], [place] [address], [place], [place] [address]      Labs: BNP (last 3 results) No results for input(s): BNP in the last 8760 hours. Basic Metabolic Panel: Recent Labs  Lab 08/31/20 1920 09/01/20 2227 09/02/20 0411 09/03/20 0353  NA 135  --  133* 135  K 4.3  --  5.3* 4.4  CL 97*  --  96* 97*  CO2 32  --  28 32  GLUCOSE 247* 421* 332* 251*  BUN 8  --  17 21*  CREATININE 0.93  --  1.05* 0.91  CALCIUM 8.8*  --  9.3 9.0  MG 1.9  --  2.1 2.2   Liver Function Tests: Recent Labs  Lab 09/02/20 0411 09/03/20 0353  AST 82* 38  ALT 63* 45*  ALKPHOS 108 95   BILITOT 0.6 0.4  PROT 8.0 7.3  ALBUMIN 3.6 3.2*   No results for input(s): LIPASE, AMYLASE in the last 168 hours. No results for input(s): AMMONIA in the last 168 hours. CBC: Recent Labs  Lab 08/31/20 1920 09/02/20 0411 09/03/20 0353  WBC 6.0 11.8* 13.3*  NEUTROABS 2.5 8.0* 7.5  HGB 13.3 14.1 13.8  HCT 42.4 44.9 43.6  MCV 86.5 85.0 85.7  PLT 190 242 216   Cardiac Enzymes: No results for input(s): CKTOTAL, CKMB, CKMBINDEX, TROPONINI in the last 168 hours. BNP: Invalid input(s): POCBNP CBG: Recent Labs  Lab 09/02/20 1130 09/02/20 1606 09/02/20 2032 09/03/20 0742 09/03/20 1151  GLUCAP 400* 395* 330* 152* 210*   D-Dimer Recent Labs    09/02/20 0411 09/03/20 0353  DDIMER 0.59* <0.27   Hgb A1c Recent Labs    09/01/20 0500  HGBA1C 9.7*   Lipid Profile Recent Labs    08/31/20 1920  TRIG 54   Thyroid function studies No results for input(s): TSH, T4TOTAL, T3FREE, THYROIDAB in the last 72 hours.  Invalid input(s): FREET3 Anemia work up Recent Labs    09/01/20 0038  FERRITIN 79   Urinalysis    Component Value Date/Time   COLORURINE STRAW (A) 09/16/2019 1237   APPEARANCEUR CLEAR 09/16/2019 1237   LABSPEC 1.015 09/16/2019 1237   PHURINE 5.0 09/16/2019 1237   GLUCOSEU >=500 (A) 09/16/2019 1237   GLUCOSEU >=1000 (A) 07/21/2016 1039   HGBUR NEGATIVE 09/16/2019 1237   BILIRUBINUR negative 11/07/2019 1156   KETONESUR NEGATIVE 09/16/2019 1237   PROTEINUR Negative 11/07/2019 1156   PROTEINUR NEGATIVE 09/16/2019 1237   UROBILINOGEN 0.2 11/07/2019 1156   UROBILINOGEN 0.2 07/21/2016 1039   NITRITE negative 11/07/2019 1156   NITRITE NEGATIVE 09/16/2019 1237   LEUKOCYTESUR Negative 11/07/2019 1156   LEUKOCYTESUR NEGATIVE 09/16/2019 1237   Sepsis Labs Invalid input(s): PROCALCITONIN,  WBC,  LACTICIDVEN Microbiology Recent Results (from the past 240 hour(s))  Resp Panel by RT-PCR (Flu A&B, Covid) Nasopharyngeal Swab     Status: Abnormal   Collection  Time: 08/31/20  6:59 PM   Specimen: Nasopharyngeal Swab; Nasopharyngeal(NP) swabs in vial transport medium  Result Value Ref Range Status   SARS Coronavirus 2 by RT PCR POSITIVE (A) NEGATIVE Final    Comment: RESULT CALLED TO, READ BACK BY AND VERIFIED WITH: BANNO,A RN _0  ON 08/31/20 JACKSON,K (NOTE) SARS-CoV-2 target nucleic acids are DETECTED.  The SARS-CoV-2 RNA is generally detectable in upper respiratory specimens during the acute phase of infection. Positive results are indicative of the presence of the identified virus, but do not rule out bacterial infection or co-infection with other pathogens not detected by the test. Clinical correlation with patient history and other diagnostic information is necessary to determine patient infection status. The expected result is Negative.  Fact Sheet for Patients: EntrepreneurPulse.com.au  Fact Sheet for Healthcare Providers: IncredibleEmployment.be  This test is not yet approved or cleared by the [place] FDA and  has been authorized for detection and/or diagnosis of SARS-CoV-2 by FDA under an Emergency Use Authorization (EUA).  This EUA will remain in effect (meaning this test ca n be used) for the duration of  the COVID-19 declaration under Section 564(b)(1) of the Act, 21 U.S.C. section 360bbb-3(b)(1), unless the authorization is terminated or revoked sooner.     Influenza A by PCR NEGATIVE NEGATIVE Final   Influenza B by PCR NEGATIVE NEGATIVE Final    Comment: (NOTE) The Xpert Xpress SARS-CoV-2/FLU/RSV plus assay is intended as an aid in the diagnosis of influenza from  Nasopharyngeal swab specimens and should not be used as a sole basis for treatment. Nasal washings and aspirates are unacceptable for Xpert Xpress SARS-CoV-2/FLU/RSV testing.  Fact Sheet for Patients: EntrepreneurPulse.com.au  Fact Sheet for Healthcare  Providers: IncredibleEmployment.be  This test is not yet approved or cleared by the [place] FDA and has been authorized for detection and/or diagnosis of SARS-CoV-2 by FDA under an Emergency Use Authorization (EUA). This EUA will remain in effect (meaning this test can be used) for the duration of the COVID-19 declaration under Section 564(b)(1) of the Act, 21 U.S.C. section 360bbb-3(b)(1), unless the authorization is terminated or revoked.  Performed at [facility], [place] [address] [place], [place] [address]     Please note: You were cared for by a hospitalist during your hospital stay. Once you are discharged, your primary care physician will handle any further medical issues. Please note that NO REFILLS for any discharge medications will be authorized once you are discharged, as it is imperative that you return to your primary care physician (or establish a relationship with a primary care physician if you do not have one) for your post hospital discharge needs so that they can reassess your need for medications and monitor your lab values.    Time coordinating discharge: 40 minutes  SIGNED:   Shelly Coss, MD  [facility] 09/03/2020, 2:27 PM Pager [phone]  If 7PM-7AM, please contact night-coverage [email or web address] Password [email or web address]

## 2020-09-03 NOTE — TOC Transition Note (Signed)
Transition of Care [facility]) - CM/SW Discharge Note   Patient Details  Name: Tammy Strickland MRN: [id number] Date of Birth: Sep 09, 1960  [facility] CM/SW Contact:  Ross Ludwig, LCSW Phone Number: 09/03/2020, 12:54 PM   Clinical Narrative:     CSW received a consult that patient needs a rolling walker.  CSW contacted [employer] and spoke to [place], he will deliver the rolling walker before patient leaves.    Patient Goals and CMS Choice        Discharge Placement                       Discharge Plan and Services                                     Social Determinants of Health (SDOH) Interventions     Readmission Risk Interventions No flowsheet data found.

## 2020-09-03 NOTE — Evaluation (Signed)
Physical Therapy Evaluation Patient Details Name: Tammy Strickland MRN: [id number] DOB: 1960/07/31 Today's Date: 09/03/2020   History of Present Illness  60 year old female  who presented with cough, congestion, pain behind the right knee/right calf. Dopplers negative for DVT.  She is unvaccinated against COVID.  On presentation she was found to be hypoxic. Dx of covid 19. Pt with history of diabetes type 2, coronary artery  disease, hypertension, COPD, OSA, diabetic polyneuropathy, diastolic congestive heart failure, hyperlipidemia, and nicotine dependence.  Clinical Impression  Pt ambulated 30' in room holding IV pole, no loss of balance, SaO2 95% on room air walking, no dyspnea. She reports she walks with a cane or rollator at baseline. She reports her rollator is from 2005 and is falling apart. Rollator recommended. No further PT indicated as she is mobilizing at baseline. Will sign off.      Follow Up Recommendations No PT follow up    Equipment Recommendations  Other (comment) (rollator)    Recommendations for Other Services       Precautions / Restrictions Precautions Precautions: Fall Precaution Comments: ~15 falls in past 1 year "because of my bad knees" Restrictions Weight Bearing Restrictions: No      Mobility  Bed Mobility Overal bed mobility: Modified Independent             General bed mobility comments: HOB up, used rail    Transfers Overall transfer level: Independent Equipment used: None                Ambulation/Gait Ambulation/Gait assistance: Modified independent (Device/Increase time) Gait Distance (Feet): 30 Feet Assistive device: IV Pole Gait Pattern/deviations: WFL(Within Functional Limits) Gait velocity: WFL   General Gait Details: 95% on RA walking, 99% RA rest, no dyspnea; walked to/from bathroom, no LOB  Stairs            Wheelchair Mobility    Modified Rankin (Stroke Patients Only)       Balance Overall balance  assessment: History of Falls;Modified Independent                                           Pertinent Vitals/Pain Pain Assessment: 0-10 Pain Score: 8  Pain Location: R knee (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, needs a TKA per pt) Pain Intervention(s): Limited activity within patient's tolerance;Monitored during session    [place] expects to be discharged to:: Private residence Living Arrangements: Alone Available Help at Discharge: Family;Neighbor;Available PRN/intermittently Type of Home: House Home Access: Stairs to enter Entrance Stairs-Rails: Left;Right;Can reach both Entrance Stairs-Number of Steps: 4 Home Layout: One level Home Equipment: Cane - single point;Walker - 4 wheels Additional Comments: uses SPC or rollator    Prior Function Level of Independence: Independent with assistive device(s)         Comments: walks with SPC or rollator, pt reports rollator is very old (2005) and falling apart, will place order for new one     Hand Dominance        Extremity/Trunk Assessment   Upper Extremity Assessment Upper Extremity Assessment: Overall WFL for tasks assessed    Lower Extremity Assessment Lower Extremity Assessment: RLE deficits/detail;LLE deficits/detail;Overall WFL for tasks assessed RLE Deficits / Details: B knee ext +4/5 RLE Sensation: history of peripheral neuropathy LLE Sensation: history of peripheral neuropathy    Cervical / Trunk Assessment Cervical / Trunk Assessment: Normal  Communication   Communication: No difficulties  Cognition Arousal/Alertness: Awake/alert Behavior During Therapy: WFL for tasks assessed/performed Overall Cognitive Status: Within Functional Limits for tasks assessed                                        General Comments      Exercises     Assessment/Plan    PT Assessment Patent does not need any further PT services  PT Problem List         PT Treatment Interventions      PT  Goals (Current goals can be found in the Care Plan section)  Acute Rehab PT Goals Patient Stated Goal: to go home PT Goal Formulation: All assessment and education complete, DC therapy    Frequency     Barriers to discharge        Co-evaluation               AM-PAC PT "6 Clicks" Mobility  Outcome Measure Help needed turning from your back to your side while in a flat bed without using bedrails?: None Help needed moving from lying on your back to sitting on the side of a flat bed without using bedrails?: None Help needed moving to and from a bed to a chair (including a wheelchair)?: None Help needed standing up from a chair using your arms (e.g., wheelchair or bedside chair)?: None Help needed to walk in hospital room?: None Help needed climbing 3-5 steps with a railing? : None 6 Click Score: 24    End of Session   Activity Tolerance: Patient tolerated treatment well Patient left: in bed;with call bell/phone within reach Nurse Communication: Mobility status      Time: 0076-2263 PT Time Calculation (min) (ACUTE ONLY): 23 min   Charges:   PT Evaluation $PT Eval Low Complexity: 1 Low PT Treatments $Gait Training: 8-22 mins        Blondell Reveal Kistler PT 09/03/2020  Acute Rehabilitation Services Pager [phone] Office [phone]

## 2020-09-03 NOTE — Plan of Care (Signed)
  Problem: Education: Goal: Knowledge of General Education information will improve Description: Including pain rating scale, medication(s)/side effects and non-pharmacologic comfort measures Outcome: Progressing   Problem: Education: Goal: Knowledge of risk factors and measures for prevention of condition will improve Outcome: Progressing   Problem: Respiratory: Goal: Will maintain a patent airway Outcome: Progressing Goal: Complications related to the disease process, condition or treatment will be avoided or minimized Outcome: Progressing

## 2020-09-04 NOTE — Telephone Encounter (Cosign Needed)
LM on 09/04/20 @ 1037 AM to r/s to 09/13/20 @ 430 PM

## 2020-09-05 ENCOUNTER — Other Ambulatory Visit: Payer: Self-pay | Admitting: Internal Medicine

## 2020-09-13 ENCOUNTER — Other Ambulatory Visit: Payer: Self-pay

## 2020-09-13 ENCOUNTER — Ambulatory Visit (INDEPENDENT_AMBULATORY_CARE_PROVIDER_SITE_OTHER): Payer: 59 | Admitting: Endocrinology

## 2020-09-13 VITALS — BP 146/80 | HR 64 | Ht 66.0 in | Wt 300.2 lb

## 2020-09-13 DIAGNOSIS — E1142 Type 2 diabetes mellitus with diabetic polyneuropathy: Secondary | ICD-10-CM

## 2020-09-13 DIAGNOSIS — Z794 Long term (current) use of insulin: Secondary | ICD-10-CM

## 2020-09-13 LAB — POCT GLYCOSYLATED HEMOGLOBIN (HGB A1C): Hemoglobin A1C: 10.6 % — AB (ref 4.0–5.6)

## 2020-09-13 MED ORDER — TOUJEO MAX SOLOSTAR 300 UNIT/ML ~~LOC~~ SOPN: 180.0000 [IU] | PEN_INJECTOR | SUBCUTANEOUS | 3 refills | Status: DC

## 2020-09-13 NOTE — Patient Instructions (Addendum)
check your blood sugar twice a day.  vary the time of day when you check, between before the 3 meals, and at bedtime.  also check if you have symptoms of your blood sugar being too high or too low.  please keep a record of the readings and bring it to your next appointment here (or you can bring the meter itself).  You can write it on any piece of paper.  please call us sooner if your blood sugar goes below 70, or if you have a lot of readings over 200.   I have sent a prescription to your pharmacy, to increase the insulin to 180 units each morning.    Please come back for a follow-up appointment in 3 months.

## 2020-09-13 NOTE — Progress Notes (Signed)
Subjective:    Patient ID: Tammy Strickland, female    DOB: [birth year], 60 y.o.   MRN: [id number]  HPI Pt returns for f/u of diabetes mellitus: DM type: Insulin-requiring type 2 Dx'ed: 0354 Complications: PN Therapy: insulin since 2017 GDM: never DKA: never Severe hypoglycemia: never Pancreatitis: once, in 2021, while on Ozempic.   Pancreatic imaging: normal on 2021 CT SDOH: none Other: she takes QD insulin, at least for now.   Interval history: she takes 160 units per day.  She finished prednisone 5 days ago.  Meter is downloaded today, and the printout is scanned into the record.  Over the past few days, cbg varies from 93-393, but it is decreasing over these 5 days.    Past Medical History:  Diagnosis Date  . Anxiety   . Arthritis   . Asthma   . CHF (congestive heart failure) ([place])   . COPD (chronic obstructive pulmonary disease) ([place])   . Coronary artery disease   . Depression   . Diabetes mellitus without complication ([place])    type 2   . Fibromyalgia   . Gallstones   . GERD (gastroesophageal reflux disease)   . HLD (hyperlipidemia)   . Hypertension   . Lupus ([place])   . Migraines   . Neuromuscular disorder ([place])   . Osteoporosis   . Oxygen deficiency    pt uses 2.5L 02 at night,(08/25/2019)pt states she no longer uses oxygen, hasn't used in 7-8 years  . Pancreatitis   . Peripheral neuropathy   . Shingles   . Sleep apnea    had sleep study done recently ; unaware if she will be getting  a CPAP device ; patient states "im pretty sure i have it , i fall alseep all the time "  . Stroke [facility]) 10/2015    Past Surgical History:  Procedure Laterality Date  . ABDOMINAL HYSTERECTOMY     ovaries left  . APPENDECTOMY    . BREAST LUMPECTOMY WITH RADIOACTIVE SEED LOCALIZATION Left 06/14/2019   Procedure: LEFT BREAST LUMPECTOMY WITH RADIOACTIVE SEED LOCALIZATION X 2;  Surgeon: Coralie Keens, MD;  Location: [facility];  Service: General;  Laterality: Left;  LMA  .  CHOLECYSTECTOMY    . IR GENERIC HISTORICAL  11/07/2015   IR ANGIO INTRA EXTRACRAN SEL COM CAROTID INNOMINATE BILAT MOD SED 11/07/2015 Luanne Bras, MD [facility]  . IR GENERIC HISTORICAL  11/07/2015   IR ANGIO VERTEBRAL SEL SUBCLAVIAN INNOMINATE UNI R MOD SED 11/07/2015 Luanne Bras, MD [facility]  . IR GENERIC HISTORICAL  11/07/2015   IR ANGIO VERTEBRAL SEL VERTEBRAL UNI L MOD SED 11/07/2015 Luanne Bras, MD [facility]  . IR GENERIC HISTORICAL  11/07/2015   IR ANGIOGRAM EXTREMITY LEFT 11/07/2015 Luanne Bras, MD [facility]  . TONSILLECTOMY    . TOTAL KNEE ARTHROPLASTY Left 03/12/2017   Procedure: LEFT TOTAL KNEE ARTHROPLASTY;  Surgeon: Mcarthur Rossetti, MD;  Location: [facility];  Service: Orthopedics;  Laterality: Left;  Adductor Block    Social History   Socioeconomic History  . Marital status: Single    Spouse name: n/a  . Number of children: 3  . Years of education: 12+  . Highest education level: Not on file  Occupational History  . 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 Smoking status: Current Every Day Smoker    Packs/day: 0.25    Years: 35.00    Pack years: 8.75  Types: Cigarettes  . Smokeless tobacco: Never Used  . Tobacco comment: referred  to smoking  cessation  classes. at  [facility] Use  . Vaping Use: Never used  Substance and Sexual Activity  . Alcohol use: Yes    Comment: occasional  . Drug use: No  . Sexual activity: Not Currently    Partners: Female    Birth control/protection: Surgical    Comment: hysterectomy  Other Topics Concern  . Not on file  Social History Narrative   Moved to [place] from [place], [place] February 2017, to help her daughter.   Lives with her daughter.   Sons live in [place] and [place].   She reports that there were originally 17 children in her family (she is the youngest), the oldest are deceased,  some prior to her birth, and she isn't sure which were female/female or how they died.   Social Determinants of Health   Financial Resource Strain: Low Risk   . Difficulty of Paying Living Expenses: Not hard at all  Food Insecurity: No Food Insecurity  . Worried About Charity fundraiser in the Last Year: Never true  . Ran Out of Food in the Last Year: Never true  Transportation Needs: No Transportation Needs  . Lack of Transportation (Medical): No  . Lack of Transportation (Non-Medical): No  Physical Activity: Inactive  . Days of Exercise per Week: 0 days  . Minutes of Exercise per Session: 0 min  Stress: No Stress Concern Present  . Feeling of Stress : Not at all  Social Connections: Socially Isolated  . Frequency of Communication with Friends and Family: More than three times a week  . Frequency of Social Gatherings with Friends and Family: Once a week  . Attends Religious Services: Never  . Active Member of Clubs or Organizations: No  . Attends Archivist Meetings: Never  . Marital Status: Never married  Intimate Partner Violence: Not on file    Current Outpatient Medications on File Prior to Visit  Medication Sig Dispense Refill  . ACCU-CHEK GUIDE test strip TEST FOUR TIMES DAILY (Patient taking differently: 1 each by Other route 4 (four) times daily. E11.65) 100 strip 2  . Accu-Chek Softclix Lancets lancets 1 each by Other route 4 (four) times daily. E11.65    . amitriptyline (ELAVIL) 75 MG tablet Take 1 tablet by mouth at bedtime (Patient taking differently: Take 75 mg by mouth at bedtime.) 30 tablet 11  . ascorbic acid (VITAMIN C) 500 MG tablet Take 1 tablet (500 mg total) by mouth daily. 7 tablet 0  . aspirin EC 81 MG tablet Take 1 tablet (81 mg total) by mouth daily.    Marland Kitchen atorvastatin (LIPITOR) 20 MG tablet Take 1 tablet by mouth every day 30 tablet 11  . Blood Glucose Monitoring Suppl (ACCU-CHEK GUIDE) w/Device KIT 1 each by Does not apply route 4 (four) times  daily. E11.65    . budesonide-formoterol (SYMBICORT) 80-4.5 MCG/ACT inhaler Inhale 2 puffs into the lungs as needed (wheezing/SOB). 10.2 g 11  . cloNIDine (CATAPRES) 0.3 MG tablet Take 1 tablet by mouth every day (Patient taking differently: Take 0.3 mg by mouth daily.) 30 tablet 11  . fluticasone (FLONASE) 50 MCG/ACT nasal spray Shake liquid & use 1 spray into each nostril every day as needed for allergies (Patient taking differently: Place 1 spray into both nostrils daily as needed for allergies.) 16 g 11  . furosemide (LASIX) 80 MG tablet Take 1 tablet  by mouth every day (Patient taking differently: Take 80 mg by mouth daily.) 30 tablet 11  . gabapentin (NEURONTIN) 600 MG tablet Take 2 tablets by mouth 3 times a day, need appt (Patient taking differently: Take 1,200 mg by mouth 3 (three) times daily.) 180 tablet 11  . guaiFENesin-dextromethorphan (ROBITUSSIN DM) 100-10 MG/5ML syrup Take 10 mLs by mouth every 4 (four) hours as needed for cough. 118 mL 0  . hydrOXYzine (ATARAX/VISTARIL) 10 MG tablet Take 10 mg by mouth 2 (two) times daily.    . Insulin Pen Needle (PEN NEEDLES) 32G X 5 MM MISC UAD daily for insulin 90 each 1  . losartan (COZAAR) 50 MG tablet Take 1 tablet by mouth twice daily (Patient taking differently: Take 50 mg by mouth 2 (two) times daily.) 60 tablet 11  . meloxicam (MOBIC) 15 MG tablet Take 15 mg by mouth daily.    . methocarbamol (ROBAXIN) 500 MG tablet Take 1 tablet (500 mg total) by mouth 4 (four) times daily. 120 tablet 5  . metoprolol (TOPROL-XL) 200 MG 24 hr tablet Take 1 tablet (200 mg total) by mouth daily. 60 tablet 11  . NARCAN 4 MG/0.1ML LIQD nasal spray kit Place 1 spray into the nose once.    . nitroGLYCERIN (NITROSTAT) 0.4 MG SL tablet Place 0.4 mg under the tongue every 5 (five) minutes as needed for chest pain.    . Nutritional Supplements (GLUCERNA HUNGER SMART SHAKE) LIQD Take 1 each by mouth 2 (two) times daily. 47300 mL 3  . oxyCODONE-acetaminophen  (PERCOCET) 10-325 MG tablet Take 1 tablet by mouth every 4 (four) hours as needed for pain.    . pantoprazole (PROTONIX) 40 MG tablet Take one tablet by mouth twice daily (am and pm) (Patient taking differently: Take 40 mg by mouth 2 (two) times daily.) 60 tablet 11  . potassium chloride SA (KLOR-CON) 20 MEQ tablet Take 20 mEq by mouth daily. Hold while on Bactrim    . tiZANidine (ZANAFLEX) 4 MG tablet Take 1 tablet by mouth at bedtime 30 tablet 2  . zinc sulfate 220 (50 Zn) MG capsule Take 1 capsule (220 mg total) by mouth daily. 7 capsule 0  . ZTLIDO 1.8 % PTCH Apply 1 patch topically daily as needed (knee pain).     No current facility-administered medications on file prior to visit.    Allergies  Allergen Reactions  . Metformin And Related Other (See Comments)    Lactic acidosis  . Other Other (See Comments)    H/o pancreatitis  . Ozempic (0.25 Or 0.5 Mg-Dose) [Semaglutide(0.25 Or 0.67m-Dos)]     H/o pancreatitis  . Bacitracin Hives    Family History  Problem Relation Age of Onset  . Hyperlipidemia Mother   . Hypertension Mother   . Stroke Mother   . Thyroid disease Mother   . Heart attack Mother   . Hyperlipidemia Father   . Hypertension Father   . Stroke Father   . Heart attack Father   . Hypertension Sister   . Stroke Sister   . Thyroid disease Sister   . Breast cancer Sister   . Crohn's disease Sister   . Hypertension Sister   . Hypertension Brother   . Diabetes Brother   . Hypertension Brother     BP (!) 146/80 (BP Location: Right Arm, Patient Position: Sitting, Cuff Size: Large)   Pulse 64   Ht _0  (1.676 m)   Wt (!) 300 lb 3.2 oz (136.2 kg)  SpO2 95%   BMI 48.45 kg/m    Review of Systems She denies hypoglycemia.      Objective:   Physical Exam VITAL SIGNS:  See vs page GENERAL: no distress Pulses: dorsalis pedis intact bilat.   MSK: no deformity of the feet CV: trace bilat leg edema Skin:  no ulcer on the feet.  normal color and temp on the  feet. Neuro: sensation is intact to touch on the feet Ext: there is bilateral onychomycosis of the toenails.  Both great toenails are absent.  No cause for the left foot pain is seen.    Lab Results  Component Value Date   HGBA1C 10.6 (A) 09/13/2020       Assessment & Plan:  Insulin-requiring type 2 DM: uncontrolled.    Patient Instructions  check your blood sugar twice a day.  vary the time of day when you check, between before the 3 meals, and at bedtime.  also check if you have symptoms of your blood sugar being too high or too low.  please keep a record of the readings and bring it to your next appointment here (or you can bring the meter itself).  You can write it on any piece of paper.  please call us sooner if your blood sugar goes below 70, or if you have a lot of readings over 200.   I have sent a prescription to your pharmacy, to increase the insulin to 180 units each morning.    Please come back for a follow-up appointment in 3 months.

## 2020-10-01 ENCOUNTER — Other Ambulatory Visit: Payer: Self-pay | Admitting: Internal Medicine

## 2020-10-01 MED ORDER — FLUTICASONE FUROATE-VILANTEROL 100-25 MCG/INH IN AEPB: 1.0000 | INHALATION_SPRAY | Freq: Every day | RESPIRATORY_TRACT | 11 refills | Status: DC

## 2020-10-01 MED ORDER — ALBUTEROL SULFATE HFA 108 (90 BASE) MCG/ACT IN AERS: 2.0000 | INHALATION_SPRAY | Freq: Four times a day (QID) | RESPIRATORY_TRACT | 8 refills | Status: AC | PRN

## 2020-10-01 MED ORDER — PEN NEEDLES 32G X 5 MM MISC: 1 refills | Status: DC

## 2020-10-09 ENCOUNTER — Emergency Department (HOSPITAL_COMMUNITY)
Admission: EM | Admit: 2020-10-09 | Discharge: 2020-10-09 | Disposition: A | Discharge status: Home / Self Care | Payer: 59 | Attending: Emergency Medicine | Admitting: Emergency Medicine

## 2020-10-09 ENCOUNTER — Encounter (HOSPITAL_COMMUNITY): Payer: Self-pay

## 2020-10-09 DIAGNOSIS — M25561 Pain in right knee: Secondary | ICD-10-CM | POA: Diagnosis not present

## 2020-10-09 DIAGNOSIS — Z794 Long term (current) use of insulin: Secondary | ICD-10-CM | POA: Insufficient documentation

## 2020-10-09 DIAGNOSIS — Z7951 Long term (current) use of inhaled steroids: Secondary | ICD-10-CM | POA: Diagnosis not present

## 2020-10-09 DIAGNOSIS — I110 Hypertensive heart disease with heart failure: Secondary | ICD-10-CM | POA: Insufficient documentation

## 2020-10-09 DIAGNOSIS — Z8616 Personal history of COVID-19: Secondary | ICD-10-CM | POA: Diagnosis not present

## 2020-10-09 DIAGNOSIS — J449 Chronic obstructive pulmonary disease, unspecified: Secondary | ICD-10-CM | POA: Diagnosis not present

## 2020-10-09 DIAGNOSIS — Z7982 Long term (current) use of aspirin: Secondary | ICD-10-CM | POA: Diagnosis not present

## 2020-10-09 DIAGNOSIS — F17210 Nicotine dependence, cigarettes, uncomplicated: Secondary | ICD-10-CM | POA: Diagnosis not present

## 2020-10-09 DIAGNOSIS — I2510 Atherosclerotic heart disease of native coronary artery without angina pectoris: Secondary | ICD-10-CM | POA: Diagnosis not present

## 2020-10-09 DIAGNOSIS — E1149 Type 2 diabetes mellitus with other diabetic neurological complication: Secondary | ICD-10-CM | POA: Diagnosis not present

## 2020-10-09 DIAGNOSIS — Z96652 Presence of left artificial knee joint: Secondary | ICD-10-CM | POA: Diagnosis not present

## 2020-10-09 DIAGNOSIS — J45909 Unspecified asthma, uncomplicated: Secondary | ICD-10-CM | POA: Insufficient documentation

## 2020-10-09 DIAGNOSIS — I5032 Chronic diastolic (congestive) heart failure: Secondary | ICD-10-CM | POA: Diagnosis not present

## 2020-10-09 DIAGNOSIS — Z79899 Other long term (current) drug therapy: Secondary | ICD-10-CM | POA: Diagnosis not present

## 2020-10-09 DIAGNOSIS — M79606 Pain in leg, unspecified: Secondary | ICD-10-CM

## 2020-10-09 MED ORDER — OXYCODONE-ACETAMINOPHEN 5-325 MG PO TABS
1.0000 | ORAL_TABLET | Freq: Once | ORAL | Status: AC
Start: 2020-10-09 — End: 2020-10-09
  Administered 2020-10-09: 1 via ORAL
  Filled 2020-10-09: qty 1

## 2020-10-09 NOTE — Discharge Instructions (Addendum)
Call your primary care doctor or specialist as discussed in the next 2-3 days.   Return immediately back to the ER if:  Your symptoms worsen within the next 12-24 hours. You develop new symptoms such as new fevers, persistent vomiting, new pain, shortness of breath, or new weakness or numbness, or if you have any other concerns.  

## 2020-10-09 NOTE — ED Triage Notes (Signed)
C/o right leg pain. Mostly behind right knee. Started hurting worse 3-4 weeks ago.  Reports chronic knee pain and takes pain medication for it.   10/10 pain.   A/ox4 Wheelchair in triage.

## 2020-10-09 NOTE — ED Provider Notes (Signed)
[facility] DEPT Provider Note   CSN: [id number] Arrival date & time: 10/09/20  1447     History Chief Complaint  Patient presents with   Leg Pain    right    Tammy Strickland is a 60 y.o. female.  Presents ER with complaint of persistent right knee pain.  She states that the pain has not gotten any better over the course of the last month.  She has persistent ache on the anterior part of the knee and the lateral part as well as the posterior part of the knee.  Is of similar reasons that been hurting for the past several years worse in the last few weeks.  She was admitted last month at that time had x-rays done again and ultrasounds done again which showed no acute pathology other than arthritic changes.  She has seen an orthopedic surgeon for her knee and was recommended that she may need a surgery at the time but has been holding off.  She also sees a pain specialist for the knee was been giving her Percocets.  Presents to the ER for repeat evaluation.      Past Medical History:  Diagnosis Date   Anxiety    Arthritis    Asthma    CHF (congestive heart failure) (HCC)    COPD (chronic obstructive pulmonary disease) (HCC)    Coronary artery disease    Depression    Diabetes mellitus without complication (HCC)    type 2    Fibromyalgia    Gallstones    GERD (gastroesophageal reflux disease)    HLD (hyperlipidemia)    Hypertension    Lupus (HCC)    Migraines    Neuromuscular disorder ([place])    Osteoporosis    Oxygen deficiency    pt uses 2.5L 02 at night,(08/25/2019)pt states she no longer uses oxygen, hasn't used in 7-8 years   Pancreatitis    Peripheral neuropathy    Shingles    Sleep apnea    had sleep study done recently ; unaware if she will be getting  a CPAP device ; patient states "im pretty sure i have it , i fall alseep all the time "   Stroke ([place]) 10/2015    Patient Active Problem List   Diagnosis Date Noted   COVID-19 virus  infection 09/01/2020   Uncontrolled type 2 diabetes mellitus with hyperglycemia, with long-term current use of insulin ([place]) 09/01/2020   Nicotine dependence, cigarettes, uncomplicated 34/91/7915   Thoracic ascending aortic aneurysm ([place]) 09/01/2020   Mucous retention cyst of lip 07/18/2020   Hordeolum externum of right lower eyelid 07/18/2020   Pica in adults 07/18/2020   Prominent metatarsal head of left foot 06/06/2020   Barrett's esophagus 04/14/2020   Right groin pain 11/07/2019   Rib pain on right side 11/07/2019   Upper abdominal pain 09/19/2019   Cough 09/19/2019   Nausea & vomiting 08/15/2019   Pancreatitis 05/27/2019   Severe obesity (BMI >= 40) ([place]) 05/24/2019   TMJ arthralgia 04/26/2019   Unilateral primary osteoarthritis, right knee 01/16/2019   Vaginal candidiasis 11/01/2018   Anxiety 11/01/2018   RUQ pain 06/22/2018   Fatty liver 06/19/2018   Lactic acid acidosis 06/15/2018   Transaminitis 06/15/2018   Chronic bilateral low back pain without sciatica 05/17/2018   Boil 05/12/2018   It band syndrome, left 12/29/2017   Trochanteric bursitis, left hip 12/29/2017   History of left knee replacement 04/21/2017   Pain in left ankle and joints  of left foot 04/21/2017   Pain in left hand 04/21/2017   OSA (obstructive sleep apnea) 02/23/2017   Acute gouty arthritis 12/25/2016   Stroke ([place])    Chronic fatigue 11/09/2016   Hypoxia 11/09/2016   Morbidly obese ([place]) 11/09/2016   Rash and nonspecific skin eruption 07/21/2016   Frequent headaches 07/21/2016   Allergic rhinitis 07/16/2016   Vitamin D deficiency 04/16/2016   Hyperlipidemia 01/15/2016   Diabetes with neurologic complications ([place]) 24/46/2863   Chronic diastolic congestive heart failure ([place]) 11/06/2015   TIA (transient ischemic attack)    Carotid-cavernous fistula    Insomnia 10/10/2015   Depression 10/10/2015   Benign essential HTN 08/15/2015   Fibromyalgia 08/15/2015   Peripheral neuropathy 08/15/2015    CAD (coronary artery disease) 08/15/2015   COPD (chronic obstructive pulmonary disease) ([place]) 08/15/2015   Osteoporosis 08/15/2015   Left knee DJD 08/15/2015   DDD (degenerative disc disease), lumbar 08/15/2015   Occupational exposure to industrial toxins 08/15/2015   GERD (gastroesophageal reflux disease) 08/15/2015    Past Surgical History:  Procedure Laterality Date   ABDOMINAL HYSTERECTOMY     ovaries left   APPENDECTOMY     BREAST LUMPECTOMY WITH RADIOACTIVE SEED LOCALIZATION Left 06/14/2019   Procedure: LEFT BREAST LUMPECTOMY WITH RADIOACTIVE SEED LOCALIZATION X 2;  Surgeon: Coralie Keens, MD;  Location: [facility];  Service: General;  Laterality: Left;  LMA   CHOLECYSTECTOMY     IR GENERIC HISTORICAL  11/07/2015   IR ANGIO INTRA EXTRACRAN SEL COM CAROTID INNOMINATE BILAT MOD SED 11/07/2015 Luanne Bras, MD [facility]   IR GENERIC HISTORICAL  11/07/2015   IR ANGIO VERTEBRAL SEL SUBCLAVIAN INNOMINATE UNI R MOD SED 11/07/2015 Luanne Bras, MD [facility]   IR GENERIC HISTORICAL  11/07/2015   IR ANGIO VERTEBRAL SEL VERTEBRAL UNI L MOD SED 11/07/2015 Luanne Bras, MD [facility]   IR GENERIC HISTORICAL  11/07/2015   IR ANGIOGRAM EXTREMITY LEFT 11/07/2015 Luanne Bras, MD [facility]   TONSILLECTOMY     TOTAL KNEE ARTHROPLASTY Left 03/12/2017   Procedure: LEFT TOTAL KNEE ARTHROPLASTY;  Surgeon: Mcarthur Rossetti, MD;  Location: [facility];  Service: Orthopedics;  Laterality: Left;  Adductor Block     OB History     Gravida  3   Para  3   Term  3   Preterm      AB      Living  3      SAB      IAB      Ectopic      Multiple      Live Births  3           Family History  Problem Relation Age of Onset   Hyperlipidemia Mother    Hypertension Mother    Stroke Mother    Thyroid disease Mother    Heart attack Mother    Hyperlipidemia Father    Hypertension Father    Stroke Father    Heart attack Father    Hypertension Sister     Stroke Sister    Thyroid disease Sister    Breast cancer Sister    Crohn's disease Sister    Hypertension Sister    Hypertension Brother    Diabetes Brother    Hypertension Brother     Social History   Tobacco Use   Smoking status: Every Day    Packs/day: 0.25    Years: 35.00    Pack years: 8.75    Types: Cigarettes  Smokeless tobacco: Never   Tobacco comments:    referred  to smoking  cessation  classes. at  [employer] Use   Vaping Use: Never used  Substance Use Topics   Alcohol use: Yes    Comment: occasional   Drug use: No    Home Medications Prior to Admission medications   Medication Sig Start Date End Date Taking? Authorizing Provider  ACCU-CHEK GUIDE test strip TEST FOUR TIMES DAILY Patient taking differently: 1 each by Other route 4 (four) times daily. E11.65 10/24/19   Binnie Rail, MD  Accu-Chek Softclix Lancets lancets 1 each by Other route 4 (four) times daily. E11.65    [provider]  albuterol (VENTOLIN HFA) 108 (90 Base) MCG/ACT inhaler Inhale 2 puffs into the lungs every 6 (six) hours as needed for wheezing or shortness of breath. 10/01/20   Burns, Claudina Lick, MD  amitriptyline (ELAVIL) 75 MG tablet Take 1 tablet by mouth at bedtime Patient taking differently: Take 75 mg by mouth at bedtime. 06/05/20   Binnie Rail, MD  ascorbic acid (VITAMIN C) 500 MG tablet Take 1 tablet (500 mg total) by mouth daily. 09/04/20   Shelly Coss, MD  aspirin EC 81 MG tablet Take 1 tablet (81 mg total) by mouth daily. 11/01/15   Elgergawy, Silver Huguenin, MD  atorvastatin (LIPITOR) 20 MG tablet Take 1 tablet by mouth every day 01/12/20   Binnie Rail, MD  Blood Glucose Monitoring Suppl (ACCU-CHEK GUIDE) w/Device KIT 1 each by Does not apply route 4 (four) times daily. E11.65    [provider]  cloNIDine (CATAPRES) 0.3 MG tablet Take 1 tablet by mouth every day Patient taking differently: Take 0.3 mg by mouth daily. 06/05/20   Binnie Rail, MD  fluticasone  (FLONASE) 50 MCG/ACT nasal spray Shake liquid & use 1 spray into each nostril every day as needed for allergies Patient taking differently: Place 1 spray into both nostrils daily as needed for allergies. 06/04/20   Binnie Rail, MD  fluticasone furoate-vilanterol (BREO ELLIPTA) 100-25 MCG/INH AEPB Inhale 1 puff into the lungs daily. 10/01/20   Binnie Rail, MD  furosemide (LASIX) 80 MG tablet Take 1 tablet by mouth every day Patient taking differently: Take 80 mg by mouth daily. 06/04/20   Binnie Rail, MD  gabapentin (NEURONTIN) 600 MG tablet Take 2 tablets by mouth 3 times a day, need appt Patient taking differently: Take 1,200 mg by mouth 3 (three) times daily. 06/04/20   Binnie Rail, MD  guaiFENesin-dextromethorphan (ROBITUSSIN DM) 100-10 MG/5ML syrup Take 10 mLs by mouth every 4 (four) hours as needed for cough. 09/03/20   Shelly Coss, MD  hydrOXYzine (ATARAX/VISTARIL) 10 MG tablet Take 10 mg by mouth 2 (two) times daily. 07/02/20   [provider]  insulin glargine, 2 Unit Dial, (TOUJEO MAX SOLOSTAR) 300 UNIT/ML Solostar Pen Inject 180 Units into the skin every morning. And pen needles 1/day 09/13/20   Renato Shin, MD  Insulin Pen Needle (PEN NEEDLES) 32G X 5 MM MISC UAD daily for insulin 10/01/20   Burns, Claudina Lick, MD  losartan (COZAAR) 50 MG tablet Take 1 tablet by mouth twice daily Patient taking differently: Take 50 mg by mouth 2 (two) times daily. 06/04/20   Binnie Rail, MD  meloxicam (MOBIC) 15 MG tablet Take 15 mg by mouth daily. 02/09/20   [provider]  methocarbamol (ROBAXIN) 500 MG tablet Take 1 tablet (500 mg total) by mouth  4 (four) times daily. 05/03/20   Binnie Rail, MD  metoprolol (TOPROL-XL) 200 MG 24 hr tablet Take 1 tablet (200 mg total) by mouth daily. 09/03/20   Shelly Coss, MD  NARCAN 4 MG/0.1ML LIQD nasal spray kit Place 1 spray into the nose once. 11/17/19   [provider]  nitroGLYCERIN (NITROSTAT) 0.4 MG SL tablet Place 0.4 mg  under the tongue every 5 (five) minutes as needed for chest pain.    [provider]  Nutritional Supplements (GLUCERNA HUNGER SMART SHAKE) LIQD Take 1 each by mouth 2 (two) times daily. 05/31/20   Renato Shin, MD  oxyCODONE-acetaminophen (PERCOCET) 10-325 MG tablet Take 1 tablet by mouth every 4 (four) hours as needed for pain. 10/19/19   [provider]  pantoprazole (PROTONIX) 40 MG tablet Take one tablet by mouth twice daily (am and pm) Patient taking differently: Take 40 mg by mouth 2 (two) times daily. 01/24/20   Binnie Rail, MD  potassium chloride SA (KLOR-CON) 20 MEQ tablet Take 20 mEq by mouth daily. Hold while on Bactrim    [provider]  tiZANidine (ZANAFLEX) 4 MG tablet Take 1 tablet by mouth at bedtime 09/05/20   Binnie Rail, MD  zinc sulfate 220 (50 Zn) MG capsule Take 1 capsule (220 mg total) by mouth daily. 09/04/20   Shelly Coss, MD  ZTLIDO 1.8 % PTCH Apply 1 patch topically daily as needed (knee pain). 06/05/20   [provider]    Allergies    Metformin and related, Other, Ozempic (0.25 or 0.5 mg-dose) [semaglutide(0.25 or 0.48m-dos)], and Bacitracin  Review of Systems   Review of Systems  Constitutional:  Negative for fever.  HENT:  Negative for ear pain.   Eyes:  Negative for pain.  Respiratory:  Negative for cough.   Cardiovascular:  Negative for chest pain.  Gastrointestinal:  Negative for abdominal pain.  Genitourinary:  Negative for flank pain.  Musculoskeletal:  Negative for back pain.  Skin:  Negative for rash.  Neurological:  Negative for headaches.   Physical Exam Updated Vital Signs BP (!) 143/74   Pulse 85   Temp 98.6 F (37 C) (Oral)   Resp 18   SpO2 95%   Physical Exam Constitutional:      General: She is not in acute distress.    Appearance: Normal appearance.  HENT:     Head: Normocephalic.     Nose: Nose normal.  Eyes:     Extraocular Movements: Extraocular movements intact.  Cardiovascular:      Rate and Rhythm: Normal rate.  Pulmonary:     Effort: Pulmonary effort is normal.  Musculoskeletal:        General: Normal range of motion.     Cervical back: Normal range of motion.     Comments: Mild swelling present on inspection of the right knee.  No abnormal warmth or laceration or lesion.  Patient able to range the knee moderately well but has painful range of motion.  No varus or valgus laxity.  No posterior or anterior laxity noted.  No posterior calf tenderness or swelling noted.  Neurovascularly intact distally bilateral upper and lower extremities.  Neurological:     General: No focal deficit present.     Mental Status: She is alert. Mental status is at baseline.    ED Results / Procedures / Treatments   Labs (all labs ordered are listed, but only abnormal results are displayed) Labs Reviewed - No data to display  EKG None  Radiology No results found.  Procedures Procedures   Medications Ordered in ED Medications  oxyCODONE-acetaminophen (PERCOCET/ROXICET) 5-325 MG per tablet 1 tablet (has no administration in time range)    ED Course  I have reviewed the triage vital signs and the nursing notes.  Pertinent labs & imaging results that were available during my care of the patient were reviewed by me and considered in my medical decision making (see chart for details).    MDM Rules/Calculators/A&P                          I reviewed her prior visit last month with a negative ultrasound for DVT negative CT angio for pulm embolism.  X-ray showing arthritic changes of the right knee.  Patient describes right knee pain is a chronic ongoing pain for several months if not years.  Patient given Percocet here in the ER, will recommend follow-up with orthopedic surgeon again for possible surgical intervention.  Recommending return if she has fevers worsening pain or any additional concerns.  Final Clinical Impression(s) / ED Diagnoses Final diagnoses:  None     Rx / DC Orders ED Discharge Orders     None        Luna Fuse, MD 10/09/20 1744

## 2020-10-10 ENCOUNTER — Telehealth: Payer: Self-pay | Admitting: Internal Medicine

## 2020-10-10 NOTE — Telephone Encounter (Signed)
    Patient calling fto request refill for  ACCU-CHEK GUIDE test strip  Accu-Chek Softclix Lancets [facility] #[id number] - [place], [place]

## 2020-10-15 MED ORDER — ACCU-CHEK GUIDE VI STRP: 1.0000 | ORAL_STRIP | Freq: Four times a day (QID) | 2 refills | Status: DC

## 2020-10-15 MED ORDER — ACCU-CHEK SOFTCLIX LANCETS MISC
1.0000 | Freq: Four times a day (QID) | 0 refills | Status: DC
Start: 2020-10-15 — End: 2021-11-17

## 2020-10-15 NOTE — Telephone Encounter (Signed)
Refilled medication

## 2020-10-30 ENCOUNTER — Other Ambulatory Visit: Payer: Self-pay | Admitting: Internal Medicine

## 2020-11-02 ENCOUNTER — Encounter (HOSPITAL_COMMUNITY): Payer: Self-pay | Admitting: Emergency Medicine

## 2020-11-02 ENCOUNTER — Other Ambulatory Visit: Payer: Self-pay

## 2020-11-02 ENCOUNTER — Emergency Department (HOSPITAL_COMMUNITY)
Admission: EM | Admit: 2020-11-02 | Discharge: 2020-11-02 | Disposition: A | Discharge status: Home / Self Care | Payer: 59 | Attending: Emergency Medicine | Admitting: Emergency Medicine

## 2020-11-02 DIAGNOSIS — F17210 Nicotine dependence, cigarettes, uncomplicated: Secondary | ICD-10-CM | POA: Diagnosis not present

## 2020-11-02 DIAGNOSIS — Z7982 Long term (current) use of aspirin: Secondary | ICD-10-CM | POA: Insufficient documentation

## 2020-11-02 DIAGNOSIS — Z8616 Personal history of COVID-19: Secondary | ICD-10-CM | POA: Diagnosis not present

## 2020-11-02 DIAGNOSIS — Z79899 Other long term (current) drug therapy: Secondary | ICD-10-CM | POA: Diagnosis not present

## 2020-11-02 DIAGNOSIS — Z96652 Presence of left artificial knee joint: Secondary | ICD-10-CM | POA: Diagnosis not present

## 2020-11-02 DIAGNOSIS — B029 Zoster without complications: Secondary | ICD-10-CM | POA: Diagnosis not present

## 2020-11-02 DIAGNOSIS — J45909 Unspecified asthma, uncomplicated: Secondary | ICD-10-CM | POA: Insufficient documentation

## 2020-11-02 DIAGNOSIS — J449 Chronic obstructive pulmonary disease, unspecified: Secondary | ICD-10-CM | POA: Insufficient documentation

## 2020-11-02 DIAGNOSIS — Z7984 Long term (current) use of oral hypoglycemic drugs: Secondary | ICD-10-CM | POA: Diagnosis not present

## 2020-11-02 DIAGNOSIS — I5032 Chronic diastolic (congestive) heart failure: Secondary | ICD-10-CM | POA: Diagnosis not present

## 2020-11-02 DIAGNOSIS — I2510 Atherosclerotic heart disease of native coronary artery without angina pectoris: Secondary | ICD-10-CM | POA: Insufficient documentation

## 2020-11-02 DIAGNOSIS — I110 Hypertensive heart disease with heart failure: Secondary | ICD-10-CM | POA: Insufficient documentation

## 2020-11-02 DIAGNOSIS — E1142 Type 2 diabetes mellitus with diabetic polyneuropathy: Secondary | ICD-10-CM | POA: Diagnosis not present

## 2020-11-02 DIAGNOSIS — R21 Rash and other nonspecific skin eruption: Secondary | ICD-10-CM | POA: Diagnosis present

## 2020-11-02 MED ORDER — VALACYCLOVIR HCL 1 G PO TABS: 1000.0000 mg | ORAL_TABLET | Freq: Three times a day (TID) | ORAL | 0 refills | Status: DC

## 2020-11-02 NOTE — Discharge Instructions (Addendum)
You are seen in the emergency department today for a painful rash that is consistent with shingles.  Please see attached handout for further details and understand that this condition is contagious.  We are sending you home with Valtrex which is an antiviral medication.  Please take this 3 times per day for the next 1 week.  We have prescribed you new medication(s) today. Discuss the medications prescribed today with your pharmacist as they can have adverse effects and interactions with your other medicines including over the counter and prescribed medications. Seek medical evaluation if you start to experience new or abnormal symptoms after taking one of these medicines, seek care immediately if you start to experience difficulty breathing, feeling of your throat closing, facial swelling, or rash as these could be indications of a more serious allergic reaction  Please continue taking your prescribed oxycodone for pain.  Please follow-up with your primary care provider within 5 days for reevaluation.  Return to the emergency department for new or worsening symptoms including but not limited to new or worsening pain, rash occurring in other areas, inability to keep fluids down, fever, passing out, or any other concerns.

## 2020-11-02 NOTE — ED Triage Notes (Signed)
Patient complains of a rash to her L flank area, states it is sore and she ' cannot stand it.' Area is blistered. Started 2 days ago, had pain and a knot to the area first.

## 2020-11-02 NOTE — ED Provider Notes (Signed)
[facility] DEPT Provider Note   CSN: [id number] Arrival date & time: 11/02/20  1022     History Chief Complaint  Patient presents with   Rash    Tammy Strickland is a 60 y.o. female with a hx of CHF, DM, hypertensin, hyperlipidemia, & lupus who presents to the ED with complaints of painful rash to the left abdomen/back x 2 days. Patient reports she noted a rash to her abdomen then subsequently her back, it is painful, burning/tingling in nature. Worse with palpation, no alleviating factors. Denies fever, chills, vomiting, diarrhea, chest pain, or dyspnea.   HPI     Past Medical History:  Diagnosis Date   Anxiety    Arthritis    Asthma    CHF (congestive heart failure) (HCC)    COPD (chronic obstructive pulmonary disease) (HCC)    Coronary artery disease    Depression    Diabetes mellitus without complication (HCC)    type 2    Fibromyalgia    Gallstones    GERD (gastroesophageal reflux disease)    HLD (hyperlipidemia)    Hypertension    Lupus (HCC)    Migraines    Neuromuscular disorder ([place])    Osteoporosis    Oxygen deficiency    pt uses 2.5L 02 at night,(08/25/2019)pt states she no longer uses oxygen, hasn't used in 7-8 years   Pancreatitis    Peripheral neuropathy    Shingles    Sleep apnea    had sleep study done recently ; unaware if she will be getting  a CPAP device ; patient states "im pretty sure i have it , i fall alseep all the time "   Stroke ([place]) 10/2015    Patient Active Problem List   Diagnosis Date Noted   COVID-19 virus infection 09/01/2020   Uncontrolled type 2 diabetes mellitus with hyperglycemia, with long-term current use of insulin ([place]) 09/01/2020   Nicotine dependence, cigarettes, uncomplicated 40/81/4481   Thoracic ascending aortic aneurysm ([place]) 09/01/2020   Mucous retention cyst of lip 07/18/2020   Hordeolum externum of right lower eyelid 07/18/2020   Pica in adults 07/18/2020   Prominent metatarsal  head of left foot 06/06/2020   Barrett's esophagus 04/14/2020   Right groin pain 11/07/2019   Rib pain on right side 11/07/2019   Upper abdominal pain 09/19/2019   Cough 09/19/2019   Nausea & vomiting 08/15/2019   Pancreatitis 05/27/2019   Severe obesity (BMI >= 40) ([place]) 05/24/2019   TMJ arthralgia 04/26/2019   Unilateral primary osteoarthritis, right knee 01/16/2019   Vaginal candidiasis 11/01/2018   Anxiety 11/01/2018   RUQ pain 06/22/2018   Fatty liver 06/19/2018   Lactic acid acidosis 06/15/2018   Transaminitis 06/15/2018   Chronic bilateral low back pain without sciatica 05/17/2018   Boil 05/12/2018   It band syndrome, left 12/29/2017   Trochanteric bursitis, left hip 12/29/2017   History of left knee replacement 04/21/2017   Pain in left ankle and joints of left foot 04/21/2017   Pain in left hand 04/21/2017   OSA (obstructive sleep apnea) 02/23/2017   Acute gouty arthritis 12/25/2016   Stroke ([place])    Chronic fatigue 11/09/2016   Hypoxia 11/09/2016   Morbidly obese ([place]) 11/09/2016   Rash and nonspecific skin eruption 07/21/2016   Frequent headaches 07/21/2016   Allergic rhinitis 07/16/2016   Vitamin D deficiency 04/16/2016   Hyperlipidemia 01/15/2016   Diabetes with neurologic complications ([place]) 85/63/1497   Chronic diastolic congestive heart failure ([place]) 11/06/2015  TIA (transient ischemic attack)    Carotid-cavernous fistula    Insomnia 10/10/2015   Depression 10/10/2015   Benign essential HTN 08/15/2015   Fibromyalgia 08/15/2015   Peripheral neuropathy 08/15/2015   CAD (coronary artery disease) 08/15/2015   COPD (chronic obstructive pulmonary disease) ([place]) 08/15/2015   Osteoporosis 08/15/2015   Left knee DJD 08/15/2015   DDD (degenerative disc disease), lumbar 08/15/2015   Occupational exposure to industrial toxins 08/15/2015   GERD (gastroesophageal reflux disease) 08/15/2015    Past Surgical History:  Procedure Laterality Date   ABDOMINAL  HYSTERECTOMY     ovaries left   APPENDECTOMY     BREAST LUMPECTOMY WITH RADIOACTIVE SEED LOCALIZATION Left 06/14/2019   Procedure: LEFT BREAST LUMPECTOMY WITH RADIOACTIVE SEED LOCALIZATION X 2;  Surgeon: Coralie Keens, MD;  Location: [facility];  Service: General;  Laterality: Left;  LMA   CHOLECYSTECTOMY     IR GENERIC HISTORICAL  11/07/2015   IR ANGIO INTRA EXTRACRAN SEL COM CAROTID INNOMINATE BILAT MOD SED 11/07/2015 Luanne Bras, MD [facility]   IR GENERIC HISTORICAL  11/07/2015   IR ANGIO VERTEBRAL SEL SUBCLAVIAN INNOMINATE UNI R MOD SED 11/07/2015 Luanne Bras, MD [facility]   IR GENERIC HISTORICAL  11/07/2015   IR ANGIO VERTEBRAL SEL VERTEBRAL UNI L MOD SED 11/07/2015 Luanne Bras, MD [facility]   IR GENERIC HISTORICAL  11/07/2015   IR ANGIOGRAM EXTREMITY LEFT 11/07/2015 Luanne Bras, MD [facility]   TONSILLECTOMY     TOTAL KNEE ARTHROPLASTY Left 03/12/2017   Procedure: LEFT TOTAL KNEE ARTHROPLASTY;  Surgeon: Mcarthur Rossetti, MD;  Location: [facility];  Service: Orthopedics;  Laterality: Left;  Adductor Block     OB History     Gravida  3   Para  3   Term  3   Preterm      AB      Living  3      SAB      IAB      Ectopic      Multiple      Live Births  3           Family History  Problem Relation Age of Onset   Hyperlipidemia Mother    Hypertension Mother    Stroke Mother    Thyroid disease Mother    Heart attack Mother    Hyperlipidemia Father    Hypertension Father    Stroke Father    Heart attack Father    Hypertension Sister    Stroke Sister    Thyroid disease Sister    Breast cancer Sister    Crohn's disease Sister    Hypertension Sister    Hypertension Brother    Diabetes Brother    Hypertension Brother     Social History   Tobacco Use   Smoking status: Every Day    Packs/day: 0.25    Years: 35.00    Pack years: 8.75    Types: Cigarettes   Smokeless tobacco: Never   Tobacco comments:    referred   to smoking  cessation  classes. at  [employer] Use   Vaping Use: Never used  Substance Use Topics   Alcohol use: Yes    Comment: occasional   Drug use: No    Home Medications Prior to Admission medications   Medication Sig Start Date End Date Taking? Authorizing Provider  tiZANidine (ZANAFLEX) 4 MG tablet Take 1 tablet by mouth at bedtime 10/30/20   Janith Lima, MD  Accu-Chek Softclix Lancets lancets 1 each by Other route 4 (four) times daily. E11.65 10/15/20   Binnie Rail, MD  albuterol (VENTOLIN HFA) 108 (90 Base) MCG/ACT inhaler Inhale 2 puffs into the lungs every 6 (six) hours as needed for wheezing or shortness of breath. 10/01/20   Burns, Claudina Lick, MD  amitriptyline (ELAVIL) 75 MG tablet Take 1 tablet by mouth at bedtime Patient taking differently: Take 75 mg by mouth at bedtime. 06/05/20   Binnie Rail, MD  ascorbic acid (VITAMIN C) 500 MG tablet Take 1 tablet (500 mg total) by mouth daily. 09/04/20   Shelly Coss, MD  aspirin EC 81 MG tablet Take 1 tablet (81 mg total) by mouth daily. 11/01/15   Elgergawy, Silver Huguenin, MD  atorvastatin (LIPITOR) 20 MG tablet Take 1 tablet by mouth every day 01/12/20   Binnie Rail, MD  Blood Glucose Monitoring Suppl (ACCU-CHEK GUIDE) w/Device KIT 1 each by Does not apply route 4 (four) times daily. E11.65    [provider]  cloNIDine (CATAPRES) 0.3 MG tablet Take 1 tablet by mouth every day Patient taking differently: Take 0.3 mg by mouth daily. 06/05/20   Binnie Rail, MD  fluticasone (FLONASE) 50 MCG/ACT nasal spray Shake liquid & use 1 spray into each nostril every day as needed for allergies Patient taking differently: Place 1 spray into both nostrils daily as needed for allergies. 06/04/20   Binnie Rail, MD  fluticasone furoate-vilanterol (BREO ELLIPTA) 100-25 MCG/INH AEPB Inhale 1 puff into the lungs daily. 10/01/20   Binnie Rail, MD  furosemide (LASIX) 80 MG tablet Take 1 tablet by mouth every day Patient taking  differently: Take 80 mg by mouth daily. 06/04/20   Binnie Rail, MD  gabapentin (NEURONTIN) 600 MG tablet Take 2 tablets by mouth 3 times a day, need appt Patient taking differently: Take 1,200 mg by mouth 3 (three) times daily. 06/04/20   Binnie Rail, MD  glucose blood (ACCU-CHEK GUIDE) test strip 1 each by Other route 4 (four) times daily. for testing 10/15/20   Binnie Rail, MD  guaiFENesin-dextromethorphan (ROBITUSSIN DM) 100-10 MG/5ML syrup Take 10 mLs by mouth every 4 (four) hours as needed for cough. 09/03/20   Shelly Coss, MD  hydrOXYzine (ATARAX/VISTARIL) 10 MG tablet Take 10 mg by mouth 2 (two) times daily. 07/02/20   [provider]  insulin glargine, 2 Unit Dial, (TOUJEO MAX SOLOSTAR) 300 UNIT/ML Solostar Pen Inject 180 Units into the skin every morning. And pen needles 1/day 09/13/20   Renato Shin, MD  Insulin Pen Needle (PEN NEEDLES) 32G X 5 MM MISC UAD daily for insulin 10/01/20   Burns, Claudina Lick, MD  losartan (COZAAR) 50 MG tablet Take 1 tablet by mouth twice daily Patient taking differently: Take 50 mg by mouth 2 (two) times daily. 06/04/20   Binnie Rail, MD  meloxicam (MOBIC) 15 MG tablet Take 15 mg by mouth daily. 02/09/20   [provider]  methocarbamol (ROBAXIN) 500 MG tablet Take 1 tablet (500 mg total) by mouth 4 (four) times daily. 05/03/20   Binnie Rail, MD  metoprolol (TOPROL-XL) 200 MG 24 hr tablet Take 1 tablet (200 mg total) by mouth daily. 09/03/20   Shelly Coss, MD  NARCAN 4 MG/0.1ML LIQD nasal spray kit Place 1 spray into the nose once. 11/17/19   [provider]  nitroGLYCERIN (NITROSTAT) 0.4 MG SL tablet Place 0.4 mg under the tongue every 5 (five) minutes as needed  for chest pain.    [provider]  Nutritional Supplements (GLUCERNA HUNGER SMART SHAKE) LIQD Take 1 each by mouth 2 (two) times daily. 05/31/20   Renato Shin, MD  oxyCODONE-acetaminophen (PERCOCET) 10-325 MG tablet Take 1 tablet by mouth every 4 (four) hours  as needed for pain. 10/19/19   [provider]  pantoprazole (PROTONIX) 40 MG tablet Take one tablet by mouth twice daily (am and pm) Patient taking differently: Take 40 mg by mouth 2 (two) times daily. 01/24/20   Binnie Rail, MD  potassium chloride SA (KLOR-CON) 20 MEQ tablet Take 20 mEq by mouth daily. Hold while on Bactrim    [provider]  zinc sulfate 220 (50 Zn) MG capsule Take 1 capsule (220 mg total) by mouth daily. 09/04/20   Shelly Coss, MD  ZTLIDO 1.8 % PTCH Apply 1 patch topically daily as needed (knee pain). 06/05/20   [provider]    Allergies    Metformin and related, Other, Ozempic (0.25 or 0.5 mg-dose) [semaglutide(0.25 or 0.62m-dos)], and Bacitracin  Review of Systems   Review of Systems  Constitutional:  Negative for chills and fever.  Respiratory:  Negative for shortness of breath.   Cardiovascular:  Negative for chest pain.  Gastrointestinal:  Negative for diarrhea and vomiting.  Skin:  Positive for rash (painful).  Neurological:  Negative for syncope.  All other systems reviewed and are negative.  Physical Exam Updated Vital Signs BP (!) 142/118 (BP Location: Left Arm)   Pulse 77   Temp 98.2 F (36.8 C) (Oral)   Resp 16   Ht '5\' 6"'  (1.676 m)   Wt 136 kg   SpO2 98%   BMI 48.39 kg/m   Physical Exam Vitals and nursing note reviewed.  Constitutional:      General: She is not in acute distress.    Appearance: She is well-developed. She is not toxic-appearing.  HENT:     Head: Normocephalic and atraumatic.  Eyes:     General:        Right eye: No discharge.        Left eye: No discharge.     Conjunctiva/sclera: Conjunctivae normal.  Cardiovascular:     Rate and Rhythm: Normal rate and regular rhythm.  Pulmonary:     Effort: Pulmonary effort is normal. No respiratory distress.     Breath sounds: Normal breath sounds. No wheezing, rhonchi or rales.  Abdominal:     General: There is no distension.     Palpations:  Abdomen is soft.     Tenderness: There is no abdominal tenderness.  Musculoskeletal:     Cervical back: Neck supple.  Skin:    General: Skin is warm and dry.     Findings: Rash (Grouped vesicles on erythematous base to the left abdomen/back in a dermatomal pattern unilaterally.  Some vesicles remain intact while others do not.  No significant purulent drainage, or increased warmth.  No pustules noted.) present.  Neurological:     Mental Status: She is alert.     Comments: Clear speech.   Psychiatric:        Behavior: Behavior normal.    ED Results / Procedures / Treatments   Labs (all labs ordered are listed, but only abnormal results are displayed) Labs Reviewed - No data to display  EKG None  Radiology No results found.  Procedures Procedures   Medications Ordered in ED Medications - No data to display  ED Course  I have reviewed the  triage vital signs and the nursing notes.  Pertinent labs & imaging results that were available during my care of the patient were reviewed by me and considered in my medical decision making (see chart for details).    MDM Rules/Calculators/A&P                           Patient presents to the ED with complaints of painful rash x 2 days. Nontoxic, vitals w/ elevated BP- doubt HTN emergency.   Additional history obtained:  Additional history obtained from chart review & nursing note review.  Most recent GFR WNL  ED Course:  Patient presenting with grouped vesicles on erythematous base in a dermatomal pattern to the left lower abdomen/back.  Clinically consistent with shingles.  No signs of involvement of CNS, face, or eyes.  Will treat with Valtrex, patient currently on oxycodone 10 mg tablets-we will have her continue this for pain control. Recommended cool compresses. Discussed that this is contagious. I discussed treatment plan, need for follow-up, and return precautions with the patient. Provided opportunity for questions, patient  confirmed understanding and is in agreement with plan.   Portions of this note were generated with [employer]. Dictation errors may occur despite best attempts at proofreading.    Final Clinical Impression(s) / ED Diagnoses Final diagnoses:  Herpes zoster without complication    Rx / DC Orders ED Discharge Orders          Ordered    valACYclovir (VALTREX) 1000 MG tablet  3 times daily        11/02/20 [address], [place], PA-C 11/02/20 1109    Valarie Merino, MD 11/02/20 1504

## 2020-11-04 ENCOUNTER — Other Ambulatory Visit: Payer: Self-pay | Admitting: Internal Medicine

## 2020-11-04 ENCOUNTER — Ambulatory Visit: Payer: 59 | Admitting: Endocrinology

## 2020-11-11 ENCOUNTER — Telehealth: Payer: Self-pay | Admitting: Internal Medicine

## 2020-11-11 ENCOUNTER — Other Ambulatory Visit: Payer: Self-pay

## 2020-11-11 MED ORDER — PEN NEEDLES 32G X 5 MM MISC: 1 refills | Status: DC

## 2020-11-11 NOTE — Telephone Encounter (Signed)
1.Medication Requested: Insulin Pen Needle (PEN NEEDLES) 32G X 5 MM MISC  2. Pharmacy (Name, [facility]): [facility], [place]  Phone:  [phone] Fax:  [phone]   3. On Med List: yes  4. Last Visit with PCP: 07/18/20  5. Next visit date with PCP: n/a   Agent: Please be advised that RX refills may take up to 3 business days. We ask that you follow-up with your pharmacy.

## 2020-11-12 ENCOUNTER — Other Ambulatory Visit: Payer: Self-pay | Admitting: Internal Medicine

## 2020-11-13 ENCOUNTER — Other Ambulatory Visit: Payer: Self-pay | Admitting: Internal Medicine

## 2020-11-24 ENCOUNTER — Other Ambulatory Visit: Payer: Self-pay | Admitting: Internal Medicine

## 2020-12-01 ENCOUNTER — Other Ambulatory Visit: Payer: Self-pay | Admitting: Endocrinology

## 2020-12-04 ENCOUNTER — Other Ambulatory Visit: Payer: Self-pay | Admitting: Internal Medicine

## 2020-12-17 ENCOUNTER — Ambulatory Visit: Payer: 59 | Admitting: Podiatry

## 2020-12-17 DIAGNOSIS — G894 Chronic pain syndrome: Secondary | ICD-10-CM | POA: Diagnosis not present

## 2020-12-17 DIAGNOSIS — J3089 Other allergic rhinitis: Secondary | ICD-10-CM | POA: Diagnosis not present

## 2020-12-17 DIAGNOSIS — M79604 Pain in right leg: Secondary | ICD-10-CM | POA: Diagnosis not present

## 2020-12-17 DIAGNOSIS — M25562 Pain in left knee: Secondary | ICD-10-CM | POA: Diagnosis not present

## 2020-12-17 DIAGNOSIS — M797 Fibromyalgia: Secondary | ICD-10-CM | POA: Diagnosis not present

## 2020-12-17 DIAGNOSIS — Z79891 Long term (current) use of opiate analgesic: Secondary | ICD-10-CM | POA: Diagnosis not present

## 2020-12-17 DIAGNOSIS — M545 Low back pain, unspecified: Secondary | ICD-10-CM | POA: Diagnosis not present

## 2020-12-17 DIAGNOSIS — M25552 Pain in left hip: Secondary | ICD-10-CM | POA: Diagnosis not present

## 2020-12-17 DIAGNOSIS — M170 Bilateral primary osteoarthritis of knee: Secondary | ICD-10-CM | POA: Diagnosis not present

## 2020-12-17 DIAGNOSIS — J45991 Cough variant asthma: Secondary | ICD-10-CM | POA: Diagnosis not present

## 2020-12-17 DIAGNOSIS — M25579 Pain in unspecified ankle and joints of unspecified foot: Secondary | ICD-10-CM | POA: Diagnosis not present

## 2020-12-17 DIAGNOSIS — M25549 Pain in joints of unspecified hand: Secondary | ICD-10-CM | POA: Diagnosis not present

## 2020-12-17 DIAGNOSIS — G43909 Migraine, unspecified, not intractable, without status migrainosus: Secondary | ICD-10-CM | POA: Diagnosis not present

## 2020-12-19 ENCOUNTER — Telehealth: Payer: Self-pay

## 2020-12-19 MED ORDER — FLUCONAZOLE 150 MG PO TABS: 150.0000 mg | ORAL_TABLET | Freq: Once | ORAL | 0 refills | Status: AC

## 2020-12-19 NOTE — Telephone Encounter (Signed)
Patient requesting medication for a yeast infection to [facility] on [address].

## 2020-12-20 ENCOUNTER — Ambulatory Visit: Payer: 59 | Admitting: Endocrinology

## 2020-12-30 ENCOUNTER — Other Ambulatory Visit: Payer: Self-pay

## 2020-12-30 ENCOUNTER — Ambulatory Visit (INDEPENDENT_AMBULATORY_CARE_PROVIDER_SITE_OTHER): Payer: Medicaid Other | Admitting: Podiatry

## 2020-12-30 ENCOUNTER — Encounter: Payer: Self-pay | Admitting: Podiatry

## 2020-12-30 DIAGNOSIS — M79676 Pain in unspecified toe(s): Secondary | ICD-10-CM | POA: Diagnosis not present

## 2020-12-30 DIAGNOSIS — M79609 Pain in unspecified limb: Secondary | ICD-10-CM

## 2020-12-30 DIAGNOSIS — Z794 Long term (current) use of insulin: Secondary | ICD-10-CM

## 2020-12-30 DIAGNOSIS — M216X2 Other acquired deformities of left foot: Secondary | ICD-10-CM

## 2020-12-30 DIAGNOSIS — B351 Tinea unguium: Secondary | ICD-10-CM

## 2020-12-30 DIAGNOSIS — E0840 Diabetes mellitus due to underlying condition with diabetic neuropathy, unspecified: Secondary | ICD-10-CM

## 2020-12-31 ENCOUNTER — Other Ambulatory Visit: Payer: Self-pay | Admitting: Endocrinology

## 2020-12-31 DIAGNOSIS — L814 Other melanin hyperpigmentation: Secondary | ICD-10-CM | POA: Diagnosis not present

## 2020-12-31 DIAGNOSIS — L608 Other nail disorders: Secondary | ICD-10-CM | POA: Diagnosis not present

## 2020-12-31 DIAGNOSIS — L603 Nail dystrophy: Secondary | ICD-10-CM | POA: Diagnosis not present

## 2021-01-01 ENCOUNTER — Other Ambulatory Visit: Payer: Self-pay

## 2021-01-01 DIAGNOSIS — E1142 Type 2 diabetes mellitus with diabetic polyneuropathy: Secondary | ICD-10-CM

## 2021-01-01 MED ORDER — PEN NEEDLES 32G X 5 MM MISC: 3 refills | Status: DC

## 2021-01-01 NOTE — Progress Notes (Signed)
Subjective: 60 y.o. presents to the office today for painful, elongated, thickened toenails which she cannot trim herself.  She says her nails are dark and is starting going to her fingernails as well.  Denies any redness or drainage around the nails.  She is also interested in diabetic shoes.  She does have neuropathy she is currently on 800 milligrams 3 times daily without any significant improvement.  She is asked for other options for neuropathy.  Was last seen for metatarsalgia and probably metatarsal heads by Dr. Valentina Lucks.   PCP: Binnie Rail, MD Endocrinologist: Dr. Renato Shin, MD last seen 09/13/2020 A1c: 10.6 on September 13, 2020  Objective: AAO 3, NAD DP/PT pulses palpable, CRT less than 3 seconds Protective sensation decreased with Simms Weinstein monofilament Nails hypertrophic, dystrophic, elongated, brittle, discolored x 8. There is tenderness overlying the nails 2-5 bilaterally. There is no surrounding erythema or drainage along the nail sites.  There is darkened discoloration to the nails on her feet toenails but also on her fingernails. No open lesions or pre-ulcerative lesions are identified.   Prominence of metatarsal heads plantarly. No other areas of tenderness bilateral lower extremities. No overlying edema, erythema, increased warmth. No pain with calf compression, swelling, warmth, erythema.  Assessment: Patient presents with symptomatic onychomycosis  Plan: -Treatment options including alternatives, risks, complications were discussed -Nails sharply debrided 8 without complication/bleeding.  Given the dark discoloration sent this for culture, pathology. -At her request she was measured for diabetic shoes today. -Recommend follow-up with her primary care physician in regards to medications for her neuropathy.  She is on a high dose of gabapentin right now. -Discussed daily foot inspection. If there are any changes, to call the office immediately.  -Follow-up in 3  months or sooner if any problems are to arise. In the meantime, encouraged to call the office with any questions, concerns, changes symptoms.  Tammy Strickland, DPM

## 2021-01-02 ENCOUNTER — Telehealth: Payer: Self-pay | Admitting: Podiatry

## 2021-01-02 NOTE — Telephone Encounter (Signed)
Left message for pt that the black boot she ordered is not available in a 9 w (available in 9 xw) but the tan boot in the same style is available to call and let me know what she would like me to order.

## 2021-01-06 ENCOUNTER — Encounter: Payer: Self-pay | Admitting: Internal Medicine

## 2021-01-06 ENCOUNTER — Other Ambulatory Visit: Payer: Self-pay

## 2021-01-06 NOTE — Progress Notes (Signed)
Subjective:    Patient ID: Tammy Strickland, female    DOB: Dec 27, 1960, 60 y.o.   MRN: [id number]  This visit occurred during the SARS-CoV-2 public health emergency.  Safety protocols were in place, including screening questions prior to the visit, additional usage of staff PPE, and extensive cleaning of exam room while observing appropriate contact time as indicated for disinfecting solutions.    HPI The patient is here for an acute visit.   Fingernails turning black - it started with two nails and has spread.  Her pain doctor did an under the nail scrapping and will get those results next week.    Where she had shingles - the marks have scabbed.  They are very black in color and she was concerned why.  She still has some pain where the shingles was.  Anxiety, panic attacks - she gets panic attacks - just walking or bending over.  They occur 4-5 times a day.    Dry mouth - it can be severe.  She drinks water all day.    She wants to stop the gabapentin - she takes 4 at night, 2 in the morning.  She does not think the medication helps and her medication has been recalled so she knows she needs to get off of it.  She gets dizzy 2-3 times a day and gets nauseous daily.  She is taking her pantoprazole twice daily and feels her heartburn is controlled.     Medications and allergies reviewed with patient and updated if appropriate.  Patient Active Problem List   Diagnosis Date Noted   COVID-19 virus infection 09/01/2020   Uncontrolled type 2 diabetes mellitus with hyperglycemia, with long-term current use of insulin ([place]) 09/01/2020   Nicotine dependence, cigarettes, uncomplicated 40/98/1191   Thoracic ascending aortic aneurysm ([place]) 09/01/2020   Mucous retention cyst of lip 07/18/2020   Hordeolum externum of right lower eyelid 07/18/2020   Pica in adults 07/18/2020   Prominent metatarsal head of left foot 06/06/2020   Barrett's esophagus 04/14/2020   Right groin pain 11/07/2019    Rib pain on right side 11/07/2019   Upper abdominal pain 09/19/2019   Cough 09/19/2019   Nausea & vomiting 08/15/2019   Pancreatitis 05/27/2019   Severe obesity (BMI >= 40) (HCC) 05/24/2019   TMJ arthralgia 04/26/2019   Unilateral primary osteoarthritis, right knee 01/16/2019   Anxiety 11/01/2018   RUQ pain 06/22/2018   Fatty liver 06/19/2018   Lactic acid acidosis 06/15/2018   Transaminitis 06/15/2018   Chronic bilateral low back pain without sciatica 05/17/2018   It band syndrome, left 12/29/2017   Trochanteric bursitis, left hip 12/29/2017   History of left knee replacement 04/21/2017   Pain in left ankle and joints of left foot 04/21/2017   Pain in left hand 04/21/2017   OSA (obstructive sleep apnea) 02/23/2017   Acute gouty arthritis 12/25/2016   Stroke ([place])    Chronic fatigue 11/09/2016   Hypoxia 11/09/2016   Morbidly obese ([place]) 11/09/2016   Rash and nonspecific skin eruption 07/21/2016   Frequent headaches 07/21/2016   Allergic rhinitis 07/16/2016   Vitamin D deficiency 04/16/2016   Hyperlipidemia 01/15/2016   Diabetes with neurologic complications ([place]) 47/82/9562   Chronic diastolic congestive heart failure ([place]) 11/06/2015   TIA (transient ischemic attack)    Carotid-cavernous fistula    Insomnia 10/10/2015   Depression 10/10/2015   Benign essential HTN 08/15/2015   Fibromyalgia 08/15/2015   Peripheral neuropathy 08/15/2015   CAD (coronary artery disease)  08/15/2015   COPD (chronic obstructive pulmonary disease) ([place]) 08/15/2015   Osteoporosis 08/15/2015   Left knee DJD 08/15/2015   DDD (degenerative disc disease), lumbar 08/15/2015   Occupational exposure to industrial toxins 08/15/2015   GERD (gastroesophageal reflux disease) 08/15/2015    Current Outpatient Medications on File Prior to Visit  Medication Sig Dispense Refill   Accu-Chek Softclix Lancets lancets 1 each by Other route 4 (four) times daily. E11.65 100 each 0   albuterol (VENTOLIN HFA)  108 (90 Base) MCG/ACT inhaler Inhale 2 puffs into the lungs every 6 (six) hours as needed for wheezing or shortness of breath. 8 g 8   amitriptyline (ELAVIL) 75 MG tablet Take 1 tablet by mouth at bedtime (Patient taking differently: Take 75 mg by mouth at bedtime.) 30 tablet 11   ascorbic acid (VITAMIN C) 500 MG tablet Take 1 tablet (500 mg total) by mouth daily. 7 tablet 0   aspirin EC 81 MG tablet Take 1 tablet (81 mg total) by mouth daily.     atorvastatin (LIPITOR) 20 MG tablet Take 1 tablet by mouth every day 30 tablet 11   Blood Glucose Monitoring Suppl (ACCU-CHEK GUIDE) w/Device KIT 1 each by Does not apply route 4 (four) times daily. E11.65     cloNIDine (CATAPRES) 0.3 MG tablet Take 1 tablet by mouth every day (Patient taking differently: Take 0.3 mg by mouth daily.) 30 tablet 11   fluticasone (FLONASE) 50 MCG/ACT nasal spray Shake liquid & use 1 spray into each nostril every day as needed for allergies (Patient taking differently: Place 1 spray into both nostrils daily as needed for allergies.) 16 g 11   fluticasone furoate-vilanterol (BREO ELLIPTA) 100-25 MCG/INH AEPB Inhale 1 puff into the lungs daily. 1 each 11   furosemide (LASIX) 80 MG tablet Take 1 tablet by mouth every day (Patient taking differently: Take 80 mg by mouth daily.) 30 tablet 11   glucose blood (ACCU-CHEK GUIDE) test strip 1 each by Other route 4 (four) times daily. for testing 100 strip 2   hydrOXYzine (ATARAX/VISTARIL) 25 MG tablet Take 25 mg by mouth at bedtime.     insulin glargine, 2 Unit Dial, (TOUJEO MAX SOLOSTAR) 300 UNIT/ML Solostar Pen Inject 180 Units into the skin every morning. And pen needles 1/day 60 mL 3   Insulin Pen Needle (PEN NEEDLES) 32G X 5 MM MISC UAD daily for insulin 90 each 3   losartan (COZAAR) 50 MG tablet Take 1 tablet by mouth twice daily (Patient taking differently: Take 50 mg by mouth 2 (two) times daily.) 60 tablet 11   meloxicam (MOBIC) 15 MG tablet Take 15 mg by mouth daily.      metoprolol (TOPROL-XL) 200 MG 24 hr tablet Take 1 tablet (200 mg total) by mouth daily. 60 tablet 11   NARCAN 4 MG/0.1ML LIQD nasal spray kit Place 1 spray into the nose once.     nitroGLYCERIN (NITROSTAT) 0.4 MG SL tablet Place 0.4 mg under the tongue every 5 (five) minutes as needed for chest pain.     Nutritional Supplements (GLUCERNA HUNGER SMART SHAKE) LIQD Take 1 each by mouth 2 (two) times daily. 47300 mL 3   oxyCODONE-acetaminophen (PERCOCET) 10-325 MG tablet Take 1 tablet by mouth every 4 (four) hours as needed for pain.     pantoprazole (PROTONIX) 40 MG tablet Take 1 tablet by mouth twice daily 60 tablet 11   potassium chloride SA (KLOR-CON) 20 MEQ tablet Take 20 mEq by mouth daily. Hold while  on Bactrim     SYMBICORT 80-4.5 MCG/ACT inhaler Inhale 2 puffs into the lungs daily as needed 10.2 each 11   tiZANidine (ZANAFLEX) 4 MG tablet Take 1 tablet by mouth at bedtime 30 tablet 0   TOUJEO SOLOSTAR 300 UNIT/ML Solostar Pen Inject 140 units subcutaneously every morning 18 mL 11   zinc sulfate 220 (50 Zn) MG capsule Take 1 capsule (220 mg total) by mouth daily. 7 capsule 0   ZTLIDO 1.8 % PTCH Apply 1 patch topically daily as needed (knee pain).     gabapentin (NEURONTIN) 600 MG tablet Take 2 tablets by mouth 3 times a day, need appt (Patient not taking: Reported on 01/07/2021) 180 tablet 11   No current facility-administered medications on file prior to visit.    Past Medical History:  Diagnosis Date   Anxiety    Arthritis    Asthma    CHF (congestive heart failure) (HCC)    COPD (chronic obstructive pulmonary disease) (HCC)    Coronary artery disease    Depression    Diabetes mellitus without complication (HCC)    type 2    Fibromyalgia    Gallstones    GERD (gastroesophageal reflux disease)    HLD (hyperlipidemia)    Hypertension    Lupus (HCC)    Migraines    Neuromuscular disorder ([place])    Osteoporosis    Oxygen deficiency    pt uses 2.5L 02 at night,(08/25/2019)pt  states she no longer uses oxygen, hasn't used in 7-8 years   Pancreatitis    Peripheral neuropathy    Shingles    Sleep apnea    had sleep study done recently ; unaware if she will be getting  a CPAP device ; patient states "im pretty sure i have it , i fall alseep all the time "   Stroke ([place]) 10/2015    Past Surgical History:  Procedure Laterality Date   ABDOMINAL HYSTERECTOMY     ovaries left   APPENDECTOMY     BREAST LUMPECTOMY WITH RADIOACTIVE SEED LOCALIZATION Left 06/14/2019   Procedure: LEFT BREAST LUMPECTOMY WITH RADIOACTIVE SEED LOCALIZATION X 2;  Surgeon: Coralie Keens, MD;  Location: [facility];  Service: General;  Laterality: Left;  LMA   CHOLECYSTECTOMY     IR GENERIC HISTORICAL  11/07/2015   IR ANGIO INTRA EXTRACRAN SEL COM CAROTID INNOMINATE BILAT MOD SED 11/07/2015 Luanne Bras, MD [facility]   IR GENERIC HISTORICAL  11/07/2015   IR ANGIO VERTEBRAL SEL SUBCLAVIAN INNOMINATE UNI R MOD SED 11/07/2015 Luanne Bras, MD [facility]   IR GENERIC HISTORICAL  11/07/2015   IR ANGIO VERTEBRAL SEL VERTEBRAL UNI L MOD SED 11/07/2015 Luanne Bras, MD [facility]   IR GENERIC HISTORICAL  11/07/2015   IR ANGIOGRAM EXTREMITY LEFT 11/07/2015 Luanne Bras, MD [facility]   TONSILLECTOMY     TOTAL KNEE ARTHROPLASTY Left 03/12/2017   Procedure: LEFT TOTAL KNEE ARTHROPLASTY;  Surgeon: Mcarthur Rossetti, MD;  Location: [facility];  Service: Orthopedics;  Laterality: Left;  Adductor Block    Social History   Socioeconomic History   Marital status: Single    Spouse name: n/a   Number of children: 3   Years of education: 12+   Highest education level: Not on file  Occupational History   Occupation: disabled-falling, doesn't recall name of toxin    Comment: formerly Psychologist, 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 at  [facility]   Vaping Use   Vaping Use: Never used  Substance and Sexual Activity   Alcohol use: Yes    Comment: occasional   Drug use: No   Sexual activity: Not Currently    Partners: Female    Birth control/protection: Surgical    Comment: hysterectomy  Other Topics Concern   Not on file  Social History Narrative   Moved to [place] from [place], [place] February 2017, to help her daughter.   Lives with her daughter.   Sons live in [place] and [place].   She reports that there were originally 17 children in her family (she is the youngest), the oldest are deceased, some prior to her birth, and she isn't sure which were female/female or how they died.   Social Determinants of Health   Financial Resource Strain: Low Risk    Difficulty of Paying Living Expenses: Not hard at all  Food Insecurity: No Food Insecurity   Worried About Charity fundraiser in the Last Year: Never true   [place] in the Last Year: Never true  Transportation Needs: No Transportation Needs   Lack of Transportation (Medical): No   Lack of Transportation (Non-Medical): No  Physical Activity: Inactive   Days of Exercise per Week: 0 days   Minutes of Exercise per Session: 0 min  Stress: No Stress Concern Present   Feeling of Stress : Not at all  Social Connections: Socially Isolated   Frequency of Communication with Friends and Family: More than three times a week   Frequency of Social Gatherings with Friends and Family: Once a week   Attends Religious Services: Never   Marine scientist or Organizations: No   Attends Music therapist: Never   Marital Status: Never married    Family History  Problem Relation Age of Onset   Hyperlipidemia Mother    Hypertension Mother    Stroke Mother    Thyroid disease Mother    Heart attack Mother    Hyperlipidemia Father    Hypertension Father    Stroke Father    Heart attack Father     Hypertension Sister    Stroke Sister    Thyroid disease Sister    Breast cancer Sister    Crohn's disease Sister    Hypertension Sister    Hypertension Brother    Diabetes Brother    Hypertension Brother     Review of Systems  Constitutional:  Positive for fatigue. Negative for fever.  Respiratory:  Positive for cough, shortness of breath and wheezing.   Cardiovascular:  Positive for chest pain, palpitations and leg swelling.  Musculoskeletal:  Positive for arthralgias and back pain.  Neurological:  Positive for dizziness, light-headedness and headaches.  Psychiatric/Behavioral:  The patient is nervous/anxious.       Objective:   Vitals:   01/07/21 0855  BP: (!) 148/82  Pulse: 63  Temp: 98.1 F (36.7 C)  SpO2: 95%   BP Readings from Last 3 Encounters:  01/07/21 (!) 148/82  11/02/20 (!) 142/118  10/09/20 128/70   Wt Readings from Last 3 Encounters:  01/07/21 (!) 327 lb (148.3 kg)  11/02/20 299 lb 13.2 oz (136 kg)  09/13/20 (!) 300 lb 3.2 oz (136.2 kg)   Body mass index is 52.78 kg/m.  Physical Exam    Constitutional: Appears well-developed and well-nourished. No distress.  Head: Normocephalic and atraumatic.  Neck: Neck supple. No tracheal deviation present. No thyromegaly present.  No cervical lymphadenopathy Cardiovascular: Normal rate, regular rhythm and normal heart sounds.  No murmur heard. No carotid bruit .  Mild b/l LE  edema Pulmonary/Chest: Effort normal and breath sounds normal. No respiratory distress. No has no wheezes. No rales.  Skin: Skin is warm and dry. Not diaphoretic. Several fingernails black in color.  Shingles lesions are dark       Assessment & Plan:    See Problem List for Assessment and Plan of chronic medical problems.

## 2021-01-07 ENCOUNTER — Ambulatory Visit (INDEPENDENT_AMBULATORY_CARE_PROVIDER_SITE_OTHER): Payer: Medicare Other | Admitting: Internal Medicine

## 2021-01-07 VITALS — BP 148/82 | HR 63 | Temp 98.1°F | Ht 66.0 in | Wt 327.0 lb

## 2021-01-07 DIAGNOSIS — I10 Essential (primary) hypertension: Secondary | ICD-10-CM | POA: Diagnosis not present

## 2021-01-07 DIAGNOSIS — Z794 Long term (current) use of insulin: Secondary | ICD-10-CM

## 2021-01-07 DIAGNOSIS — E559 Vitamin D deficiency, unspecified: Secondary | ICD-10-CM

## 2021-01-07 DIAGNOSIS — M25531 Pain in right wrist: Secondary | ICD-10-CM

## 2021-01-07 DIAGNOSIS — E782 Mixed hyperlipidemia: Secondary | ICD-10-CM | POA: Diagnosis not present

## 2021-01-07 DIAGNOSIS — E611 Iron deficiency: Secondary | ICD-10-CM

## 2021-01-07 DIAGNOSIS — G8929 Other chronic pain: Secondary | ICD-10-CM

## 2021-01-07 DIAGNOSIS — G4733 Obstructive sleep apnea (adult) (pediatric): Secondary | ICD-10-CM | POA: Diagnosis not present

## 2021-01-07 DIAGNOSIS — Z23 Encounter for immunization: Secondary | ICD-10-CM | POA: Diagnosis not present

## 2021-01-07 DIAGNOSIS — F5089 Other specified eating disorder: Secondary | ICD-10-CM

## 2021-01-07 DIAGNOSIS — E1165 Type 2 diabetes mellitus with hyperglycemia: Secondary | ICD-10-CM | POA: Diagnosis not present

## 2021-01-07 DIAGNOSIS — L608 Other nail disorders: Secondary | ICD-10-CM | POA: Diagnosis not present

## 2021-01-07 DIAGNOSIS — M6740 Ganglion, unspecified site: Secondary | ICD-10-CM

## 2021-01-07 LAB — COMPREHENSIVE METABOLIC PANEL
ALT: 23 U/L (ref 0–35)
AST: 39 U/L — ABNORMAL HIGH (ref 0–37)
Albumin: 3.9 g/dL (ref 3.5–5.2)
Alkaline Phosphatase: 101 U/L (ref 39–117)
BUN: 8 mg/dL (ref 6–23)
CO2: 32 mEq/L (ref 19–32)
Calcium: 8.9 mg/dL (ref 8.4–10.5)
Chloride: 99 mEq/L (ref 96–112)
Creatinine, Ser: 0.77 mg/dL (ref 0.40–1.20)
GFR: 84.82 mL/min (ref >60.00)
Glucose, Bld: 184 mg/dL — ABNORMAL HIGH (ref 70–99)
Potassium: 4.2 mEq/L (ref 3.5–5.1)
Sodium: 138 mEq/L (ref 135–145)
Total Bilirubin: 0.4 mg/dL (ref 0.2–1.2)
Total Protein: 7.3 g/dL (ref 6.0–8.3)

## 2021-01-07 LAB — CBC WITH DIFFERENTIAL/PLATELET
Basophils Absolute: 0.1 10*3/uL (ref 0.0–0.1)
Basophils Relative: 0.6 % (ref 0.0–3.0)
Eosinophils Absolute: 0.3 10*3/uL (ref 0.0–0.7)
Eosinophils Relative: 3.1 % (ref 0.0–5.0)
HCT: 39.7 % (ref 36.0–46.0)
Hemoglobin: 12.6 g/dL (ref 12.0–15.0)
Lymphocytes Relative: 45.3 % (ref 12.0–46.0)
Lymphs Abs: 4.4 10*3/uL — ABNORMAL HIGH (ref 0.7–4.0)
MCHC: 31.8 g/dL (ref 30.0–36.0)
MCV: 83.5 fl (ref 78.0–100.0)
Monocytes Absolute: 1 10*3/uL (ref 0.1–1.0)
Monocytes Relative: 9.9 % (ref 3.0–12.0)
Neutro Abs: 4 10*3/uL (ref 1.4–7.7)
Neutrophils Relative %: 41.1 % — ABNORMAL LOW (ref 43.0–77.0)
Platelets: 278 10*3/uL (ref 150.0–400.0)
RBC: 4.75 Mil/uL (ref 3.87–5.11)
RDW: 17 % — ABNORMAL HIGH (ref 11.5–15.5)
WBC: 9.7 10*3/uL (ref 4.0–10.5)

## 2021-01-07 LAB — LIPID PANEL
Cholesterol: 110 mg/dL (ref 0–200)
HDL: 43.8 mg/dL (ref >39.00)
LDL Cholesterol: 46 mg/dL (ref 0–99)
NonHDL: 66.55
Total CHOL/HDL Ratio: 3
Triglycerides: 104 mg/dL (ref 0.0–149.0)
VLDL: 20.8 mg/dL (ref 0.0–40.0)

## 2021-01-07 LAB — VITAMIN B12: Vitamin B-12: 417 pg/mL (ref 211–911)

## 2021-01-07 LAB — VITAMIN D 25 HYDROXY (VIT D DEFICIENCY, FRACTURES): VITD: 13.87 ng/mL — ABNORMAL LOW (ref 30.00–100.00)

## 2021-01-07 LAB — TSH: TSH: 5.02 u[IU]/mL (ref 0.35–5.50)

## 2021-01-07 LAB — HEMOGLOBIN A1C: Hgb A1c MFr Bld: 8.7 % — ABNORMAL HIGH (ref 4.6–6.5)

## 2021-01-07 MED ORDER — BUSPIRONE HCL 5 MG PO TABS
5.0000 mg | ORAL_TABLET | Freq: Three times a day (TID) | ORAL | 1 refills | Status: DC
Start: 2021-01-07 — End: 2021-04-08

## 2021-01-07 NOTE — Patient Instructions (Addendum)
  Blood work was ordered.     Medications changes include :   buspar 5 mg three times a day.  Stop the hydroxyzine during the day.    Start 1 gabapentin in the morning x 2 more days and then stop. Take 2 gabapentin at night x 2 nights and then stop.    Depending on what the nail scraping shows you should see a dermatologist for your fingernails.     Your prescription(s) have been submitted to your pharmacy. Please take as directed and contact our office if you believe you are having problem(s) with the medication(s).   A referral was ordered for  sports medicine and pulmonary.      Someone from their office will call you to schedule an appointment.    Please followup in 3 weeks

## 2021-01-07 NOTE — Assessment & Plan Note (Signed)
Chronic Blood pressure elevated here today She is in pain, which is likely influencing her blood pressure She thinks her blood pressure is typically lower than this We will continue losartan 50 mg twice daily, metoprolol XL 200 mg daily, clonidine 0.3 mg daily CMP

## 2021-01-07 NOTE — Assessment & Plan Note (Signed)
Chronic Fingernails turning black Did have a nail scraping by pain management at her last visit-did find out that his results next week Will check B12, iron levels Advised seeing dermatology

## 2021-01-07 NOTE — Assessment & Plan Note (Signed)
Chronic Taking vitamin D daily Check vitamin D level  

## 2021-01-07 NOTE — Assessment & Plan Note (Signed)
Chronic Continue atorvastatin 20 mg daily Check lipid panel, CMP Stressed weight loss, exercise, healthy diet

## 2021-01-07 NOTE — Assessment & Plan Note (Signed)
Chronic Advised follow-up with Dr. Loanne Drilling who is managing her diabetes Will check A1c

## 2021-01-07 NOTE — Assessment & Plan Note (Signed)
New problem Noted on left wrist and to fingers on the left hand Will refer to sports medicine

## 2021-01-07 NOTE — Assessment & Plan Note (Signed)
Chronic Has never been on CPAP Mild sleep apnea diagnosed in 2018 She has gained weight since then and has had weakness apnea Discussed she needs to get back with pulmonary, get retested if needed and get started on CPAP She has significant daytime drowsiness, which is likely multifactorial-medications and sleep apnea contributing Stressed sleeping only at night and not during the day Referral to pulmonary

## 2021-01-07 NOTE — Assessment & Plan Note (Signed)
Acute Pain and swelling in right wrist Refer to sports medicine

## 2021-01-07 NOTE — Addendum Note (Signed)
Addended by: Marcina Millard on: 01/07/2021 03:05 PM   Modules accepted: Orders

## 2021-01-07 NOTE — Assessment & Plan Note (Signed)
Chronic Check CBC, iron panel 

## 2021-01-08 LAB — IRON,TIBC AND FERRITIN PANEL
%SAT: 8 % (calc) — ABNORMAL LOW (ref 16–45)
Ferritin: 23 ng/mL (ref 16–232)
Iron: 29 ug/dL — ABNORMAL LOW (ref 45–160)
TIBC: 381 mcg/dL (calc) (ref 250–450)

## 2021-01-09 ENCOUNTER — Other Ambulatory Visit: Payer: Self-pay | Admitting: Internal Medicine

## 2021-01-09 MED ORDER — VITAMIN D (ERGOCALCIFEROL) 1.25 MG (50000 UNIT) PO CAPS: 50000.0000 [IU] | ORAL_CAPSULE | ORAL | 0 refills | Status: DC

## 2021-01-09 NOTE — Addendum Note (Signed)
Addended by: Binnie Rail on: 01/09/2021 07:52 PM   Modules accepted: Orders

## 2021-01-14 ENCOUNTER — Other Ambulatory Visit: Payer: Self-pay | Admitting: Internal Medicine

## 2021-01-14 NOTE — Progress Notes (Deleted)
Tammy Strickland D.[place] Phone: [phone]   Assessment and Plan:     There are no diagnoses linked to this encounter.  ***   Pertinent previous records reviewed include ***   Follow Up: ***     Subjective:   I, Tammy Strickland, am serving as a scribe for Dr. Glennon Mac  Chief Complaint: Bilateral wrist pain   HPI:   01/14/21 Patient is a 60 year old female presenting with Bilateral wrist pain and noted ganglion cysts on the Left wrist and fingers. Right wrist has noticeable swelling.   Radiates:  Aggravates: Treatments tried:    Relevant Historical Information: ***  Additional pertinent review of systems negative.   Current Outpatient Medications:    ACCU-CHEK GUIDE test strip, 1 EACH BY OTHER ROUTE FOUR TIMES DAILY DAILY FOR TESTING., Disp: 100 strip, Rfl: 2   Accu-Chek Softclix Lancets lancets, 1 each by Other route 4 (four) times daily. E11.65, Disp: 100 each, Rfl: 0   albuterol (VENTOLIN HFA) 108 (90 Base) MCG/ACT inhaler, Inhale 2 puffs into the lungs every 6 (six) hours as needed for wheezing or shortness of breath., Disp: 8 g, Rfl: 8   amitriptyline (ELAVIL) 75 MG tablet, Take 1 tablet by mouth at bedtime (Patient taking differently: Take 75 mg by mouth at bedtime.), Disp: 30 tablet, Rfl: 11   ascorbic acid (VITAMIN C) 500 MG tablet, Take 1 tablet (500 mg total) by mouth daily., Disp: 7 tablet, Rfl: 0   aspirin EC 81 MG tablet, Take 1 tablet (81 mg total) by mouth daily., Disp: , Rfl:    atorvastatin (LIPITOR) 20 MG tablet, Take 1 tablet by mouth every day, Disp: 30 tablet, Rfl: 11   Blood Glucose Monitoring Suppl (ACCU-CHEK GUIDE) w/Device KIT, 1 each by Does not apply route 4 (four) times daily. E11.65, Disp: , Rfl:    busPIRone (BUSPAR) 5 MG tablet, Take 1 tablet (5 mg total) by mouth 3 (three) times daily., Disp: 90 tablet, Rfl: 1   cloNIDine (CATAPRES) 0.3 MG tablet, Take 1  tablet by mouth every day (Patient taking differently: Take 0.3 mg by mouth daily.), Disp: 30 tablet, Rfl: 11   fluticasone (FLONASE) 50 MCG/ACT nasal spray, Shake liquid & use 1 spray into each nostril every day as needed for allergies (Patient taking differently: Place 1 spray into both nostrils daily as needed for allergies.), Disp: 16 g, Rfl: 11   fluticasone furoate-vilanterol (BREO ELLIPTA) 100-25 MCG/INH AEPB, Inhale 1 puff into the lungs daily., Disp: 1 each, Rfl: 11   furosemide (LASIX) 80 MG tablet, Take 1 tablet by mouth every day (Patient taking differently: Take 80 mg by mouth daily.), Disp: 30 tablet, Rfl: 11   gabapentin (NEURONTIN) 600 MG tablet, Take 2 tablets by mouth 3 times a day, need appt (Patient not taking: Reported on 01/07/2021), Disp: 180 tablet, Rfl: 11   hydrOXYzine (ATARAX/VISTARIL) 25 MG tablet, Take 25 mg by mouth at bedtime., Disp: , Rfl:    insulin glargine, 2 Unit Dial, (TOUJEO MAX SOLOSTAR) 300 UNIT/ML Solostar Pen, Inject 180 Units into the skin every morning. And pen needles 1/day, Disp: 60 mL, Rfl: 3   Insulin Pen Needle (PEN NEEDLES) 32G X 5 MM MISC, UAD daily for insulin, Disp: 90 each, Rfl: 3   losartan (COZAAR) 50 MG tablet, Take 1 tablet by mouth twice daily (Patient taking differently: Take 50 mg by mouth 2 (two) times daily.), Disp: 60 tablet,  Rfl: 11   meloxicam (MOBIC) 15 MG tablet, Take 15 mg by mouth daily., Disp: , Rfl:    metoprolol (TOPROL-XL) 200 MG 24 hr tablet, Take 1 tablet (200 mg total) by mouth daily., Disp: 60 tablet, Rfl: 11   NARCAN 4 MG/0.1ML LIQD nasal spray kit, Place 1 spray into the nose once., Disp: , Rfl:    nitroGLYCERIN (NITROSTAT) 0.4 MG SL tablet, Place 0.4 mg under the tongue every 5 (five) minutes as needed for chest pain., Disp: , Rfl:    Nutritional Supplements (GLUCERNA HUNGER SMART SHAKE) LIQD, Take 1 each by mouth 2 (two) times daily., Disp: 47300 mL, Rfl: 3   oxyCODONE-acetaminophen (PERCOCET) 10-325 MG tablet, Take 1  tablet by mouth every 4 (four) hours as needed for pain., Disp: , Rfl:    pantoprazole (PROTONIX) 40 MG tablet, Take 1 tablet by mouth twice daily, Disp: 60 tablet, Rfl: 11   potassium chloride SA (KLOR-CON) 20 MEQ tablet, Take 20 mEq by mouth daily. Hold while on Bactrim, Disp: , Rfl:    SYMBICORT 80-4.5 MCG/ACT inhaler, Inhale 2 puffs into the lungs daily as needed, Disp: 10.2 each, Rfl: 11   tiZANidine (ZANAFLEX) 4 MG tablet, Take 1 tablet by mouth at bedtime, Disp: 30 tablet, Rfl: 0   TOUJEO SOLOSTAR 300 UNIT/ML Solostar Pen, Inject 140 units subcutaneously every morning, Disp: 18 mL, Rfl: 11   Vitamin D, Ergocalciferol, (DRISDOL) 1.25 MG (50000 UNIT) CAPS capsule, Take 1 capsule (50,000 Units total) by mouth every 7 (seven) days., Disp: 12 capsule, Rfl: 0   zinc sulfate 220 (50 Zn) MG capsule, Take 1 capsule (220 mg total) by mouth daily., Disp: 7 capsule, Rfl: 0   ZTLIDO 1.8 % PTCH, Apply 1 patch topically daily as needed (knee pain)., Disp: , Rfl:    Objective:     There were no vitals filed for this visit.    There is no height or weight on file to calculate BMI.    Physical Exam:    ***   Electronically signed by:  Tammy Strickland D.[facility] 3:50 PM 01/14/21

## 2021-01-15 ENCOUNTER — Ambulatory Visit: Payer: Medicaid Other | Admitting: Sports Medicine

## 2021-01-17 DIAGNOSIS — H5213 Myopia, bilateral: Secondary | ICD-10-CM | POA: Diagnosis not present

## 2021-01-22 ENCOUNTER — Ambulatory Visit: Payer: Medicare Other | Admitting: Sports Medicine

## 2021-01-22 NOTE — Progress Notes (Deleted)
Benito Mccreedy D.[facility] [place] Phone: [phone]   Assessment and Plan:     There are no diagnoses linked to this encounter.  ***   Pertinent previous records reviewed include ***   Follow Up: ***     Subjective:   I, Judy Pimple, am serving as a scribe for Dr. Glennon Mac  Chief Complaint: right wrist pain and cysts on left wrist   HPI:   01/22/21 Patient is a 60 year old female presenting with right wrist pain and cysts on the left wrist. Patient states   Right wrist  Radiates:  Mechanical symptoms: Swelling: Aggravates: Treatments tried:    Relevant Historical Information: ***  Additional pertinent review of systems negative.   Current Outpatient Medications:    ACCU-CHEK GUIDE test strip, 1 EACH BY OTHER ROUTE FOUR TIMES DAILY DAILY FOR TESTING., Disp: 100 strip, Rfl: 2   Accu-Chek Softclix Lancets lancets, 1 each by Other route 4 (four) times daily. E11.65, Disp: 100 each, Rfl: 0   albuterol (VENTOLIN HFA) 108 (90 Base) MCG/ACT inhaler, Inhale 2 puffs into the lungs every 6 (six) hours as needed for wheezing or shortness of breath., Disp: 8 g, Rfl: 8   amitriptyline (ELAVIL) 75 MG tablet, Take 1 tablet by mouth at bedtime (Patient taking differently: Take 75 mg by mouth at bedtime.), Disp: 30 tablet, Rfl: 11   ascorbic acid (VITAMIN C) 500 MG tablet, Take 1 tablet (500 mg total) by mouth daily., Disp: 7 tablet, Rfl: 0   aspirin EC 81 MG tablet, Take 1 tablet (81 mg total) by mouth daily., Disp: , Rfl:    atorvastatin (LIPITOR) 20 MG tablet, Take 1 tablet by mouth every day, Disp: 30 tablet, Rfl: 11   Blood Glucose Monitoring Suppl (ACCU-CHEK GUIDE) w/Device KIT, 1 each by Does not apply route 4 (four) times daily. E11.65, Disp: , Rfl:    busPIRone (BUSPAR) 5 MG tablet, Take 1 tablet (5 mg total) by mouth 3 (three) times daily., Disp: 90 tablet, Rfl: 1   cloNIDine (CATAPRES) 0.3 MG  tablet, Take 1 tablet by mouth every day (Patient taking differently: Take 0.3 mg by mouth daily.), Disp: 30 tablet, Rfl: 11   fluticasone (FLONASE) 50 MCG/ACT nasal spray, Shake liquid & use 1 spray into each nostril every day as needed for allergies (Patient taking differently: Place 1 spray into both nostrils daily as needed for allergies.), Disp: 16 g, Rfl: 11   fluticasone furoate-vilanterol (BREO ELLIPTA) 100-25 MCG/INH AEPB, Inhale 1 puff into the lungs daily., Disp: 1 each, Rfl: 11   furosemide (LASIX) 80 MG tablet, Take 1 tablet by mouth every day (Patient taking differently: Take 80 mg by mouth daily.), Disp: 30 tablet, Rfl: 11   gabapentin (NEURONTIN) 600 MG tablet, Take 2 tablets by mouth 3 times a day, need appt (Patient not taking: Reported on 01/07/2021), Disp: 180 tablet, Rfl: 11   hydrOXYzine (ATARAX/VISTARIL) 25 MG tablet, Take 25 mg by mouth at bedtime., Disp: , Rfl:    insulin glargine, 2 Unit Dial, (TOUJEO MAX SOLOSTAR) 300 UNIT/ML Solostar Pen, Inject 180 Units into the skin every morning. And pen needles 1/day, Disp: 60 mL, Rfl: 3   Insulin Pen Needle (PEN NEEDLES) 32G X 5 MM MISC, UAD daily for insulin, Disp: 90 each, Rfl: 3   losartan (COZAAR) 50 MG tablet, Take 1 tablet by mouth twice daily (Patient taking differently: Take 50 mg by mouth 2 (two) times  daily.), Disp: 60 tablet, Rfl: 11   meloxicam (MOBIC) 15 MG tablet, Take 15 mg by mouth daily., Disp: , Rfl:    metoprolol (TOPROL-XL) 200 MG 24 hr tablet, Take 1 tablet (200 mg total) by mouth daily., Disp: 60 tablet, Rfl: 11   NARCAN 4 MG/0.1ML LIQD nasal spray kit, Place 1 spray into the nose once., Disp: , Rfl:    nitroGLYCERIN (NITROSTAT) 0.4 MG SL tablet, Place 0.4 mg under the tongue every 5 (five) minutes as needed for chest pain., Disp: , Rfl:    Nutritional Supplements (GLUCERNA HUNGER SMART SHAKE) LIQD, Take 1 each by mouth 2 (two) times daily., Disp: 47300 mL, Rfl: 3   oxyCODONE-acetaminophen (PERCOCET) 10-325 MG  tablet, Take 1 tablet by mouth every 4 (four) hours as needed for pain., Disp: , Rfl:    pantoprazole (PROTONIX) 40 MG tablet, Take 1 tablet by mouth twice daily, Disp: 60 tablet, Rfl: 11   potassium chloride SA (KLOR-CON) 20 MEQ tablet, Take 20 mEq by mouth daily. Hold while on Bactrim, Disp: , Rfl:    SYMBICORT 80-4.5 MCG/ACT inhaler, Inhale 2 puffs into the lungs daily as needed, Disp: 10.2 each, Rfl: 11   tiZANidine (ZANAFLEX) 4 MG tablet, Take 1 tablet by mouth at bedtime, Disp: 30 tablet, Rfl: 3   TOUJEO SOLOSTAR 300 UNIT/ML Solostar Pen, Inject 140 units subcutaneously every morning, Disp: 18 mL, Rfl: 11   Vitamin D, Ergocalciferol, (DRISDOL) 1.25 MG (50000 UNIT) CAPS capsule, Take 1 capsule (50,000 Units total) by mouth every 7 (seven) days., Disp: 12 capsule, Rfl: 0   zinc sulfate 220 (50 Zn) MG capsule, Take 1 capsule (220 mg total) by mouth daily., Disp: 7 capsule, Rfl: 0   ZTLIDO 1.8 % PTCH, Apply 1 patch topically daily as needed (knee pain)., Disp: , Rfl:    Objective:     There were no vitals filed for this visit.    There is no height or weight on file to calculate BMI.    Physical Exam:    ***   Electronically signed by:  Benito Mccreedy D.[facility] 7:41 AM 01/22/21

## 2021-01-27 ENCOUNTER — Encounter: Payer: Self-pay | Admitting: Internal Medicine

## 2021-01-27 NOTE — Progress Notes (Signed)
Tammy Strickland D.[place] Phone: [phone]   Assessment and Plan:     1. Bilateral wrist pain 2. 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  She trialed CSI to ganglion cyst in the past with no benefit, so I will not repeat injections today - Plan referral to hand surgery for removal of cysts cysts are causing daily pain and affecting daily activities.  Patient agrees she would like conservative treatment at this time - Use Tylenol/NSAIDs as needed for pain control.  Warm hand baths for generalized arthritic type hand pain -X-rays obtained in clinic.  My interpretation:No acute fracture.  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 - DG Hand Complete Left; Future - DG Hand Complete Right; Future     Pertinent previous records reviewed include lab work including A1c from 01/07/2021   Follow Up: As needed   Subjective:   I, Tammy Strickland, am serving as a scribe for Dr. Glennon Mac  Chief Complaint: right wrist pain and Left wrist cyst.   HPI:   01/28/21 Patient is a 60 year old female presenting with R wrist pain and cysts on the L wrist ongoing for years.  She says her most painful cysts is on her left thumb that has been present since her senior year of high school.  States other cyst symptoms present for at least 3 to 4 years.  Denies new change in size.  Denies warmth, drainage, trauma.  She says that she has had injections at the sites in the past with no relief.  Pt locates pain to the L radial aspect of the wrist and on bilat palmar aspect of thumbs. Pt also c/o pain on the L 3rd finger. Pt c/o pain and stiffness in  both hands.  Grip strength: yes Numbness/tingling: yes Aggravates: TTP, ringing out rags Treatments tried: warm soak, wrist brace   Relevant Historical Information: Hypertension, CHF, CAD, COPD, OSA, GERD, DM type II poorly controlled  Additional pertinent review of systems negative.   Current Outpatient Medications:    ACCU-CHEK GUIDE test strip, 1 EACH BY OTHER ROUTE FOUR TIMES DAILY DAILY FOR TESTING., Disp: 100 strip, Rfl: 2   Accu-Chek Softclix Lancets lancets, 1 each by Other route 4 (four) times daily. E11.65, Disp: 100 each, Rfl: 0   albuterol (VENTOLIN HFA) 108 (90 Base) MCG/ACT inhaler, Inhale 2 puffs into the lungs every 6 (six) hours as needed for wheezing or shortness of breath., Disp: 8 g, Rfl: 8   amitriptyline (ELAVIL) 75 MG tablet, Take 1 tablet by mouth at bedtime (Patient taking differently: Take 75 mg by mouth at bedtime.), Disp: 30 tablet, Rfl: 11   ascorbic acid (VITAMIN C) 500 MG tablet, Take 1 tablet (500 mg total) by mouth daily., Disp: 7 tablet, Rfl: 0   aspirin EC 81 MG tablet, Take 1 tablet (81 mg total) by mouth daily., Disp: , Rfl:    atorvastatin (LIPITOR) 20 MG tablet, Take 1 tablet by mouth every day, Disp: 30 tablet, Rfl: 11   Blood Glucose Monitoring Suppl (ACCU-CHEK GUIDE) w/Device KIT, 1 each by Does not apply route 4 (four) times daily. E11.65, Disp: , Rfl:    busPIRone (BUSPAR) 5 MG tablet,  Take 1 tablet (5 mg total) by mouth 3 (three) times daily., Disp: 90 tablet, Rfl: 1   Cholecalciferol (VITAMIN D3) 50 MCG (2000 UT) capsule, Take 1 capsule (2,000 Units total) by mouth daily., Disp: 90 capsule, Rfl: 1   cloNIDine (CATAPRES) 0.3 MG tablet, Take 1 tablet (0.3 mg total) by mouth 2 (two) times daily., Disp: 180 tablet, Rfl: 3   fluticasone (FLONASE) 50 MCG/ACT nasal spray, Shake liquid & use 1 spray into each nostril every day as needed for allergies (Patient taking differently: Place 1 spray into both nostrils daily as needed for allergies.), Disp: 16  g, Rfl: 11   fluticasone furoate-vilanterol (BREO ELLIPTA) 100-25 MCG/INH AEPB, Inhale 1 puff into the lungs daily., Disp: 1 each, Rfl: 11   furosemide (LASIX) 80 MG tablet, Take 1 tablet by mouth every day (Patient taking differently: Take 80 mg by mouth daily.), Disp: 30 tablet, Rfl: 11   gabapentin (NEURONTIN) 600 MG tablet, Take 2 tablets (1,200 mg total) by mouth 2 (two) times daily., Disp: 360 tablet, Rfl: 0   hydrOXYzine (ATARAX/VISTARIL) 25 MG tablet, Take 25 mg by mouth at bedtime., Disp: , Rfl:    ibuprofen (ADVIL) 600 MG tablet, Take 1 tablet (600 mg total) by mouth 2 (two) times daily as needed for moderate pain., Disp: 60 tablet, Rfl: 3   insulin glargine, 2 Unit Dial, (TOUJEO MAX SOLOSTAR) 300 UNIT/ML Solostar Pen, Inject 180 Units into the skin every morning. And pen needles 1/day, Disp: 60 mL, Rfl: 3   Insulin Pen Needle (PEN NEEDLES) 32G X 5 MM MISC, UAD daily for insulin, Disp: 90 each, Rfl: 3   Iron, Ferrous Sulfate, 325 (65 Fe) MG TABS, Take 325 mg by mouth daily., Disp: 90 tablet, Rfl: 1   losartan (COZAAR) 50 MG tablet, Take 1 tablet by mouth twice daily (Patient taking differently: Take 50 mg by mouth 2 (two) times daily.), Disp: 60 tablet, Rfl: 11   metoprolol (TOPROL-XL) 200 MG 24 hr tablet, Take 1 tablet (200 mg total) by mouth daily., Disp: 60 tablet, Rfl: 11   NARCAN 4 MG/0.1ML LIQD nasal spray kit, Place 1 spray into the nose once., Disp: , Rfl:    nitroGLYCERIN (NITROSTAT) 0.4 MG SL tablet, Place 0.4 mg under the tongue every 5 (five) minutes as needed for chest pain., Disp: , Rfl:    Nutritional Supplements (GLUCERNA HUNGER SMART SHAKE) LIQD, Take 1 each by mouth 2 (two) times daily., Disp: 47300 mL, Rfl: 3   oxyCODONE-acetaminophen (PERCOCET) 10-325 MG tablet, Take 1 tablet by mouth every 4 (four) hours as needed for pain., Disp: , Rfl:    pantoprazole (PROTONIX) 40 MG tablet, Take 1 tablet by mouth twice daily, Disp: 60 tablet, Rfl: 11   potassium chloride SA  (KLOR-CON) 20 MEQ tablet, Take 20 mEq by mouth daily. Hold while on Bactrim, Disp: , Rfl:    sulfamethoxazole-trimethoprim (BACTRIM DS) 800-160 MG tablet, Take 1 tablet by mouth 2 (two) times daily., Disp: 14 tablet, Rfl: 0   SYMBICORT 80-4.5 MCG/ACT inhaler, Inhale 2 puffs into the lungs daily as needed, Disp: 10.2 each, Rfl: 11   tiZANidine (ZANAFLEX) 4 MG tablet, Take 1 tablet by mouth at bedtime, Disp: 30 tablet, Rfl: 3   TOUJEO SOLOSTAR 300 UNIT/ML Solostar Pen, Inject 140 units subcutaneously every morning, Disp: 18 mL, Rfl: 11   zinc sulfate 220 (50 Zn) MG capsule, Take 1 capsule (220 mg total) by mouth daily., Disp: 7 capsule, Rfl: 0   ZTLIDO 1.8 %  PTCH, Apply 1 patch topically daily as needed (knee pain)., Disp: , Rfl:    Objective:     Vitals:   01/28/21 0831  BP: 128/82  Pulse: 63  SpO2: 95%  Weight: (!) 311 lb 12.8 oz (141.4 kg)  Height: 5' 6" (1.676 m)      Body mass index is 50.33 kg/m.    Physical Exam:    General: Appears well, nad, nontoxic and pleasant Neuro:sensation intact, strength is 5/5 with df/pf/inv/ev, muscle tone wnl Skin:no susupicious lesions or rashes  Bilateral hand and wrist:  No deformity or swelling appreciated. Ganglion cyst include: 2 x 2 millimeter hard mobile cyst over first wrist compartment on left, 3 x 3 mm hard mobile cyst over first MCP left, 2 x 2 hard mobile cyst over first MCP right, one by one hard mobile cyst over medial surface of third PIP left.  All cysts TTP without erythema, drainage Wrist ROM  Ext 90, flexion70, radial/ulnar deviation 30 nttp over the snauff box, dorsal carpals, volar carpals, radial styloid, ulnar styloid, tfcc Negative Tinel's Negative finklestein Neg tfcc bounce test No pain with resisted ext, flex or deviation    Electronically signed by:  Tammy Strickland D.[facility] 9:32 AM 01/28/21

## 2021-01-27 NOTE — Progress Notes (Signed)
Subjective:    Patient ID: Tammy Strickland, female    DOB: 07-29-1960, 60 y.o.   MRN: [id number]  This visit occurred during the SARS-CoV-2 public health emergency.  Safety protocols were in place, including screening questions prior to the visit, additional usage of staff PPE, and extensive cleaning of exam room while observing appropriate contact time as indicated for disinfecting solutions.     HPI The patient is here for follow up of their chronic medical problems, including anxiety  We stopped the hydroxyzine and start buspar 5 mg TID.  Her anxiety is better.    She tried to taper off the gabapentin.  She has felt increased pain tapering off the medication and still thinks she needs it.    She has a lot of pain.    Medications and allergies reviewed with patient and updated if appropriate.  Patient Active Problem List   Diagnosis Date Noted   Ganglion cyst 01/07/2021   Nail discoloration 01/07/2021   Right wrist pain 01/07/2021   COVID-19 virus infection 09/01/2020   Uncontrolled type 2 diabetes mellitus with hyperglycemia, with long-term current use of insulin ([place]) 09/01/2020   Nicotine dependence, cigarettes, uncomplicated 35/00/9381   Thoracic ascending aortic aneurysm 09/01/2020   Mucous retention cyst of lip 07/18/2020   Pica in adults 07/18/2020   Prominent metatarsal head of left foot 06/06/2020   Barrett's esophagus 04/14/2020   Rib pain on right side 11/07/2019   Upper abdominal pain 09/19/2019   Cough 09/19/2019   Nausea & vomiting 08/15/2019   Pancreatitis 05/27/2019   Severe obesity (BMI >= 40) ([place]) 05/24/2019   TMJ arthralgia 04/26/2019   Unilateral primary osteoarthritis, right knee 01/16/2019   Anxiety 11/01/2018   RUQ pain 06/22/2018   Fatty liver 06/19/2018   Lactic acid acidosis 06/15/2018   Transaminitis 06/15/2018   Chronic bilateral low back pain without sciatica 05/17/2018   It band syndrome, left 12/29/2017   Trochanteric bursitis,  left hip 12/29/2017   History of left knee replacement 04/21/2017   Pain in left ankle and joints of left foot 04/21/2017   Pain in left hand 04/21/2017   OSA (obstructive sleep apnea) 02/23/2017   Acute gouty arthritis 12/25/2016   Stroke ([place])    Chronic fatigue 11/09/2016   Hypoxia 11/09/2016   Morbidly obese ([place]) 11/09/2016   Rash and nonspecific skin eruption 07/21/2016   Frequent headaches 07/21/2016   Allergic rhinitis 07/16/2016   Vitamin D deficiency 04/16/2016   Hyperlipidemia 01/15/2016   Diabetes with neurologic complications ([place]) 82/99/3716   Chronic diastolic congestive heart failure ([place]) 11/06/2015   TIA (transient ischemic attack)    Carotid-cavernous fistula    Insomnia 10/10/2015   Depression 10/10/2015   Benign essential HTN 08/15/2015   Fibromyalgia 08/15/2015   Peripheral neuropathy 08/15/2015   CAD (coronary artery disease) 08/15/2015   COPD (chronic obstructive pulmonary disease) ([place]) 08/15/2015   Osteoporosis 08/15/2015   Left knee DJD 08/15/2015   DDD (degenerative disc disease), lumbar 08/15/2015   Occupational exposure to industrial toxins 08/15/2015   GERD (gastroesophageal reflux disease) 08/15/2015    Current Outpatient Medications on File Prior to Visit  Medication Sig Dispense Refill   ACCU-CHEK GUIDE test strip 1 EACH BY OTHER ROUTE FOUR TIMES DAILY DAILY FOR TESTING. 100 strip 2   Accu-Chek Softclix Lancets lancets 1 each by Other route 4 (four) times daily. E11.65 100 each 0   albuterol (VENTOLIN HFA) 108 (90 Base) MCG/ACT inhaler Inhale 2 puffs into the  lungs every 6 (six) hours as needed for wheezing or shortness of breath. 8 g 8   amitriptyline (ELAVIL) 75 MG tablet Take 1 tablet by mouth at bedtime (Patient taking differently: Take 75 mg by mouth at bedtime.) 30 tablet 11   ascorbic acid (VITAMIN C) 500 MG tablet Take 1 tablet (500 mg total) by mouth daily. 7 tablet 0   aspirin EC 81 MG tablet Take 1 tablet (81 mg total) by mouth  daily.     atorvastatin (LIPITOR) 20 MG tablet Take 1 tablet by mouth every day 30 tablet 11   Blood Glucose Monitoring Suppl (ACCU-CHEK GUIDE) w/Device KIT 1 each by Does not apply route 4 (four) times daily. E11.65     busPIRone (BUSPAR) 5 MG tablet Take 1 tablet (5 mg total) by mouth 3 (three) times daily. 90 tablet 1   cloNIDine (CATAPRES) 0.3 MG tablet Take 1 tablet by mouth every day (Patient taking differently: Take 0.3 mg by mouth daily.) 30 tablet 11   fluticasone (FLONASE) 50 MCG/ACT nasal spray Shake liquid & use 1 spray into each nostril every day as needed for allergies (Patient taking differently: Place 1 spray into both nostrils daily as needed for allergies.) 16 g 11   fluticasone furoate-vilanterol (BREO ELLIPTA) 100-25 MCG/INH AEPB Inhale 1 puff into the lungs daily. 1 each 11   furosemide (LASIX) 80 MG tablet Take 1 tablet by mouth every day (Patient taking differently: Take 80 mg by mouth daily.) 30 tablet 11   hydrOXYzine (ATARAX/VISTARIL) 25 MG tablet Take 25 mg by mouth at bedtime.     insulin glargine, 2 Unit Dial, (TOUJEO MAX SOLOSTAR) 300 UNIT/ML Solostar Pen Inject 180 Units into the skin every morning. And pen needles 1/day 60 mL 3   Insulin Pen Needle (PEN NEEDLES) 32G X 5 MM MISC UAD daily for insulin 90 each 3   losartan (COZAAR) 50 MG tablet Take 1 tablet by mouth twice daily (Patient taking differently: Take 50 mg by mouth 2 (two) times daily.) 60 tablet 11   metoprolol (TOPROL-XL) 200 MG 24 hr tablet Take 1 tablet (200 mg total) by mouth daily. 60 tablet 11   NARCAN 4 MG/0.1ML LIQD nasal spray kit Place 1 spray into the nose once.     nitroGLYCERIN (NITROSTAT) 0.4 MG SL tablet Place 0.4 mg under the tongue every 5 (five) minutes as needed for chest pain.     Nutritional Supplements (GLUCERNA HUNGER SMART SHAKE) LIQD Take 1 each by mouth 2 (two) times daily. 47300 mL 3   oxyCODONE-acetaminophen (PERCOCET) 10-325 MG tablet Take 1 tablet by mouth every 4 (four) hours as  needed for pain.     pantoprazole (PROTONIX) 40 MG tablet Take 1 tablet by mouth twice daily 60 tablet 11   potassium chloride SA (KLOR-CON) 20 MEQ tablet Take 20 mEq by mouth daily. Hold while on Bactrim     SYMBICORT 80-4.5 MCG/ACT inhaler Inhale 2 puffs into the lungs daily as needed 10.2 each 11   tiZANidine (ZANAFLEX) 4 MG tablet Take 1 tablet by mouth at bedtime 30 tablet 3   TOUJEO SOLOSTAR 300 UNIT/ML Solostar Pen Inject 140 units subcutaneously every morning 18 mL 11   zinc sulfate 220 (50 Zn) MG capsule Take 1 capsule (220 mg total) by mouth daily. 7 capsule 0   ZTLIDO 1.8 % PTCH Apply 1 patch topically daily as needed (knee pain).     No current facility-administered medications on file prior to visit.  Past Medical History:  Diagnosis Date   Anxiety    Arthritis    Asthma    CHF (congestive heart failure) (H[facility])    COPD (chronic obstructive pulmonary disease) ([facility])    Coronary artery disease    Depression    Diabetes mellitus without complication ([facility])    type 2    Fibromyalgia    Gallstones    GERD (gastroesophageal reflux disease)    HLD (hyperlipidemia)    Hypertension    Lupus (H[facility])    Migraines    Neuromuscular disorder ([place])    Osteoporosis    Oxygen deficiency    pt uses 2.5L 02 at night,(08/25/2019)pt states she no longer uses oxygen, hasn't used in 7-8 years   Pancreatitis    Peripheral neuropathy    Shingles    Sleep apnea    had sleep study done recently ; unaware if she will be getting  a CPAP device ; patient states "im pretty sure i have it , i fall alseep all the time "   Stroke ([place]) 10/2015    Past Surgical History:  Procedure Laterality Date   ABDOMINAL HYSTERECTOMY     ovaries left   APPENDECTOMY     BREAST LUMPECTOMY WITH RADIOACTIVE SEED LOCALIZATION Left 06/14/2019   Procedure: LEFT BREAST LUMPECTOMY WITH RADIOACTIVE SEED LOCALIZATION X 2;  Surgeon: Coralie Keens, MD;  Location: [facility];  Service: General;  Laterality: Left;  LMA    CHOLECYSTECTOMY     IR GENERIC HISTORICAL  11/07/2015   IR ANGIO INTRA EXTRACRAN SEL COM CAROTID INNOMINATE BILAT MOD SED 11/07/2015 Luanne Bras, MD [facility]   IR GENERIC HISTORICAL  11/07/2015   IR ANGIO VERTEBRAL SEL SUBCLAVIAN INNOMINATE UNI R MOD SED 11/07/2015 Luanne Bras, MD [facility]   IR GENERIC HISTORICAL  11/07/2015   IR ANGIO VERTEBRAL SEL VERTEBRAL UNI L MOD SED 11/07/2015 Luanne Bras, MD [facility]   IR GENERIC HISTORICAL  11/07/2015   IR ANGIOGRAM EXTREMITY LEFT 11/07/2015 Luanne Bras, MD [facility]   TONSILLECTOMY     TOTAL KNEE ARTHROPLASTY Left 03/12/2017   Procedure: LEFT TOTAL KNEE ARTHROPLASTY;  Surgeon: Mcarthur Rossetti, MD;  Location: [facility];  Service: Orthopedics;  Laterality: Left;  Adductor Block    Social History   Socioeconomic History   Marital status: Single    Spouse name: n/a   Number of children: 3   Years of education: 12+   Highest education level: Not on file  Occupational History   Occupation: disabled-falling, doesn't recall name of toxin    Comment: formerly Psychologist, 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 at  [facility]   Vaping Use   Vaping Use: Never used  Substance and Sexual Activity   Alcohol use: Yes    Comment: occasional   Drug use: No   Sexual activity: Not Currently    Partners: Female    Birth control/protection: Surgical    Comment: hysterectomy  Other Topics Concern   Not on file  Social History Narrative   Moved to [place] from [place], [place] February 2017, to help her daughter.   Lives with her daughter.   Sons live in [place] and [place].   She reports that there were originally 51  children in her family (she is the youngest), the oldest are deceased, some prior to her birth, and she  isn't sure which were female/female or how they died.   Social Determinants of Health   Financial Resource Strain: Low Risk    Difficulty of Paying Living Expenses: Not hard at all  Food Insecurity: No Food Insecurity   Worried About Charity fundraiser in the Last Year: Never true   [place] in the Last Year: Never true  Transportation Needs: No Transportation Needs   Lack of Transportation (Medical): No   Lack of Transportation (Non-Medical): No  Physical Activity: Inactive   Days of Exercise per Week: 0 days   Minutes of Exercise per Session: 0 min  Stress: No Stress Concern Present   Feeling of Stress : Not at all  Social Connections: Socially Isolated   Frequency of Communication with Friends and Family: More than three times a week   Frequency of Social Gatherings with Friends and Family: Once a week   Attends Religious Services: Never   Marine scientist or Organizations: No   Attends Music therapist: Never   Marital Status: Never married    Family History  Problem Relation Age of Onset   Hyperlipidemia Mother    Hypertension Mother    Stroke Mother    Thyroid disease Mother    Heart attack Mother    Hyperlipidemia Father    Hypertension Father    Stroke Father    Heart attack Father    Hypertension Sister    Stroke Sister    Thyroid disease Sister    Breast cancer Sister    Crohn's disease Sister    Hypertension Sister    Hypertension Brother    Diabetes Brother    Hypertension Brother     Review of Systems  Constitutional:  Negative for chills and fever.  Respiratory:  Positive for cough and wheezing (occ). Negative for shortness of breath.   Cardiovascular:  Positive for leg swelling. Negative for chest pain and palpitations.  Musculoskeletal:  Positive for arthralgias, back pain and myalgias.  Neurological:  Positive for dizziness (occ). Negative for light-headedness and headaches.      Objective:   Vitals:   01/28/21 0751   BP: (!) 146/84  Pulse: 71  Temp: 98.5 F (36.9 C)  SpO2: 96%   BP Readings from Last 3 Encounters:  01/28/21 (!) 146/84  01/07/21 (!) 148/82  11/02/20 (!) 142/118   Wt Readings from Last 3 Encounters:  01/28/21 (!) 309 lb (140.2 kg)  01/07/21 (!) 327 lb (148.3 kg)  11/02/20 299 lb 13.2 oz (136 kg)   Body mass index is 49.87 kg/m.   Physical Exam    Constitutional: Appears well-developed and well-nourished. No distress.  HENT:  Head: Normocephalic and atraumatic.  Skin: Skin is warm and dry. Not diaphoretic.  Small mildly tender boil right buttock Psychiatric: Normal mood and affect. Behavior is normal.      Assessment & Plan:    See Problem List for Assessment and Plan of chronic medical problems.

## 2021-01-28 ENCOUNTER — Ambulatory Visit (INDEPENDENT_AMBULATORY_CARE_PROVIDER_SITE_OTHER): Payer: Medicare Other

## 2021-01-28 ENCOUNTER — Ambulatory Visit (INDEPENDENT_AMBULATORY_CARE_PROVIDER_SITE_OTHER): Payer: Medicare Other | Admitting: Sports Medicine

## 2021-01-28 ENCOUNTER — Ambulatory Visit (INDEPENDENT_AMBULATORY_CARE_PROVIDER_SITE_OTHER): Payer: Medicare Other | Admitting: Internal Medicine

## 2021-01-28 ENCOUNTER — Ambulatory Visit: Payer: Self-pay

## 2021-01-28 ENCOUNTER — Other Ambulatory Visit: Payer: Self-pay

## 2021-01-28 VITALS — BP 128/82 | HR 63 | Ht 66.0 in | Wt 311.8 lb

## 2021-01-28 VITALS — BP 146/84 | HR 71 | Temp 98.5°F | Ht 66.0 in | Wt 309.0 lb

## 2021-01-28 DIAGNOSIS — M6740 Ganglion, unspecified site: Secondary | ICD-10-CM

## 2021-01-28 DIAGNOSIS — M25531 Pain in right wrist: Secondary | ICD-10-CM

## 2021-01-28 DIAGNOSIS — M79641 Pain in right hand: Secondary | ICD-10-CM | POA: Diagnosis not present

## 2021-01-28 DIAGNOSIS — G894 Chronic pain syndrome: Secondary | ICD-10-CM | POA: Insufficient documentation

## 2021-01-28 DIAGNOSIS — M25532 Pain in left wrist: Secondary | ICD-10-CM

## 2021-01-28 DIAGNOSIS — E559 Vitamin D deficiency, unspecified: Secondary | ICD-10-CM | POA: Diagnosis not present

## 2021-01-28 DIAGNOSIS — L608 Other nail disorders: Secondary | ICD-10-CM | POA: Diagnosis not present

## 2021-01-28 DIAGNOSIS — F419 Anxiety disorder, unspecified: Secondary | ICD-10-CM

## 2021-01-28 DIAGNOSIS — M79642 Pain in left hand: Secondary | ICD-10-CM | POA: Diagnosis not present

## 2021-01-28 DIAGNOSIS — E611 Iron deficiency: Secondary | ICD-10-CM | POA: Diagnosis not present

## 2021-01-28 DIAGNOSIS — I10 Essential (primary) hypertension: Secondary | ICD-10-CM

## 2021-01-28 DIAGNOSIS — L0232 Furuncle of buttock: Secondary | ICD-10-CM | POA: Diagnosis not present

## 2021-01-28 MED ORDER — CLONIDINE HCL 0.3 MG PO TABS: 0.3000 mg | ORAL_TABLET | Freq: Three times a day (TID) | ORAL | 3 refills | Status: DC

## 2021-01-28 MED ORDER — SULFAMETHOXAZOLE-TRIMETHOPRIM 800-160 MG PO TABS: 1.0000 | ORAL_TABLET | Freq: Two times a day (BID) | ORAL | 0 refills | Status: DC

## 2021-01-28 MED ORDER — IBUPROFEN 600 MG PO TABS: 600.0000 mg | ORAL_TABLET | Freq: Two times a day (BID) | ORAL | 3 refills | Status: DC | PRN

## 2021-01-28 MED ORDER — GABAPENTIN 600 MG PO TABS: 1200.0000 mg | ORAL_TABLET | Freq: Two times a day (BID) | ORAL | 0 refills | Status: DC

## 2021-01-28 MED ORDER — IRON (FERROUS SULFATE) 325 (65 FE) MG PO TABS: 325.0000 mg | ORAL_TABLET | Freq: Every day | ORAL | 1 refills | Status: DC

## 2021-01-28 MED ORDER — CLONIDINE HCL 0.3 MG PO TABS: 0.3000 mg | ORAL_TABLET | Freq: Two times a day (BID) | ORAL | 3 refills | Status: DC

## 2021-01-28 MED ORDER — VITAMIN D3 50 MCG (2000 UT) PO CAPS: 2000.0000 [IU] | ORAL_CAPSULE | Freq: Every day | ORAL | 1 refills | Status: DC

## 2021-01-28 NOTE — Assessment & Plan Note (Signed)
Acute Right inner buttock-boil that she has been getting some pus out of Advised warm soaks Start Bactrim DS 800-160 mg twice daily x7 days

## 2021-01-28 NOTE — Assessment & Plan Note (Signed)
Multiple fingernails Referred to dermatology for further evaluation

## 2021-01-28 NOTE — Patient Instructions (Addendum)
     Medications changes include :   ibuprofen 600 mg twice daily as needed.  Start Bactrim for your boil ( antibiotic).   Increase clonidine to twice a day.     Your prescription(s) have been submitted to your pharmacy. Please take as directed and contact our office if you believe you are having problem(s) with the medication(s).   A referral was ordered for dermatology       Someone from their office will call you to schedule an appointment.    Please followup in 3 months

## 2021-01-28 NOTE — Patient Instructions (Addendum)
Thank you for coming in today.   Please get an Xray today before you leave   I've referred you to see [facility] to see Dr. Amedeo Plenty  Follow up with me as needed

## 2021-01-28 NOTE — Assessment & Plan Note (Signed)
Chronic At her last visit I prescribed high-dose vitamin D and instead of taking it once a week she took it daily and she has completed high-dose vitamin D Advised in 2 weeks to start daily vitamin D 2000 units daily to maintain a normal level

## 2021-01-28 NOTE — Assessment & Plan Note (Signed)
Chronic History of panic attacks At her last visit we started buspirone 5 mg 3 times daily and she states that is helping Continue BuSpar 5 mg 3 times daily Continue hydroxyzine 25 mg at bedtime and amitriptyline 75 mg at bedtime

## 2021-01-28 NOTE — Assessment & Plan Note (Signed)
Chronic Not ideally controlled Currently only taking clonidine 0.3 mg daily-we will increase to twice daily Continue losartan 50 mg twice daily Working on weight loss Follow-up in 3 months

## 2021-01-28 NOTE — Assessment & Plan Note (Signed)
Chronic Likely nutritional Start ferrous sulfate daily

## 2021-01-28 NOTE — Assessment & Plan Note (Signed)
Chronic Pain is multifactorial-has chronic back pain, joint pain, fibromyalgia She is working on weight loss Following with pain management and on oxycodone Continue gabapentin-she did try to taper off gabapentin but pain increased-currently taking 1200 mg twice daily-can increase this again to 3 times daily as needed Ibuprofen is effective for her-we will prescribe ibuprofen 600 mg twice daily-advised not to ideally take this twice daily on a regular basis and only take on occasion because of risks of medication long-term, which we did discuss Continue tizanidine at current dose and amitriptyline at night

## 2021-02-11 DIAGNOSIS — J0111 Acute recurrent frontal sinusitis: Secondary | ICD-10-CM | POA: Diagnosis not present

## 2021-02-11 DIAGNOSIS — M797 Fibromyalgia: Secondary | ICD-10-CM | POA: Diagnosis not present

## 2021-02-11 DIAGNOSIS — G894 Chronic pain syndrome: Secondary | ICD-10-CM | POA: Diagnosis not present

## 2021-02-11 DIAGNOSIS — M545 Low back pain, unspecified: Secondary | ICD-10-CM | POA: Diagnosis not present

## 2021-02-11 DIAGNOSIS — M25579 Pain in unspecified ankle and joints of unspecified foot: Secondary | ICD-10-CM | POA: Diagnosis not present

## 2021-02-11 DIAGNOSIS — J3089 Other allergic rhinitis: Secondary | ICD-10-CM | POA: Diagnosis not present

## 2021-02-11 DIAGNOSIS — M25552 Pain in left hip: Secondary | ICD-10-CM | POA: Diagnosis not present

## 2021-02-11 DIAGNOSIS — Z79891 Long term (current) use of opiate analgesic: Secondary | ICD-10-CM | POA: Diagnosis not present

## 2021-02-11 DIAGNOSIS — M79604 Pain in right leg: Secondary | ICD-10-CM | POA: Diagnosis not present

## 2021-02-11 DIAGNOSIS — J0110 Acute frontal sinusitis, unspecified: Secondary | ICD-10-CM | POA: Diagnosis not present

## 2021-02-11 DIAGNOSIS — M25549 Pain in joints of unspecified hand: Secondary | ICD-10-CM | POA: Diagnosis not present

## 2021-02-11 DIAGNOSIS — M170 Bilateral primary osteoarthritis of knee: Secondary | ICD-10-CM | POA: Diagnosis not present

## 2021-02-11 DIAGNOSIS — M25562 Pain in left knee: Secondary | ICD-10-CM | POA: Diagnosis not present

## 2021-02-18 DIAGNOSIS — M65312 Trigger thumb, left thumb: Secondary | ICD-10-CM | POA: Diagnosis not present

## 2021-02-18 DIAGNOSIS — R2232 Localized swelling, mass and lump, left upper limb: Secondary | ICD-10-CM | POA: Diagnosis not present

## 2021-02-18 DIAGNOSIS — M25532 Pain in left wrist: Secondary | ICD-10-CM | POA: Diagnosis not present

## 2021-02-18 DIAGNOSIS — M65332 Trigger finger, left middle finger: Secondary | ICD-10-CM | POA: Diagnosis not present

## 2021-02-18 DIAGNOSIS — M79642 Pain in left hand: Secondary | ICD-10-CM | POA: Diagnosis not present

## 2021-02-18 DIAGNOSIS — M79645 Pain in left finger(s): Secondary | ICD-10-CM | POA: Diagnosis not present

## 2021-02-19 DIAGNOSIS — M65332 Trigger finger, left middle finger: Secondary | ICD-10-CM | POA: Insufficient documentation

## 2021-02-19 DIAGNOSIS — R2232 Localized swelling, mass and lump, left upper limb: Secondary | ICD-10-CM | POA: Insufficient documentation

## 2021-03-02 DIAGNOSIS — M79642 Pain in left hand: Secondary | ICD-10-CM | POA: Diagnosis not present

## 2021-03-11 DIAGNOSIS — M65312 Trigger thumb, left thumb: Secondary | ICD-10-CM | POA: Diagnosis not present

## 2021-03-11 DIAGNOSIS — Z79891 Long term (current) use of opiate analgesic: Secondary | ICD-10-CM | POA: Diagnosis not present

## 2021-03-11 DIAGNOSIS — G894 Chronic pain syndrome: Secondary | ICD-10-CM | POA: Diagnosis not present

## 2021-03-11 DIAGNOSIS — M170 Bilateral primary osteoarthritis of knee: Secondary | ICD-10-CM | POA: Diagnosis not present

## 2021-03-11 DIAGNOSIS — M79604 Pain in right leg: Secondary | ICD-10-CM | POA: Diagnosis not present

## 2021-03-11 DIAGNOSIS — R2232 Localized swelling, mass and lump, left upper limb: Secondary | ICD-10-CM | POA: Diagnosis not present

## 2021-03-11 DIAGNOSIS — J0111 Acute recurrent frontal sinusitis: Secondary | ICD-10-CM | POA: Diagnosis not present

## 2021-03-11 DIAGNOSIS — J3089 Other allergic rhinitis: Secondary | ICD-10-CM | POA: Diagnosis not present

## 2021-03-11 DIAGNOSIS — M25549 Pain in joints of unspecified hand: Secondary | ICD-10-CM | POA: Diagnosis not present

## 2021-03-11 DIAGNOSIS — M25562 Pain in left knee: Secondary | ICD-10-CM | POA: Diagnosis not present

## 2021-03-11 DIAGNOSIS — G43909 Migraine, unspecified, not intractable, without status migrainosus: Secondary | ICD-10-CM | POA: Diagnosis not present

## 2021-03-11 DIAGNOSIS — M25579 Pain in unspecified ankle and joints of unspecified foot: Secondary | ICD-10-CM | POA: Diagnosis not present

## 2021-03-11 DIAGNOSIS — M545 Low back pain, unspecified: Secondary | ICD-10-CM | POA: Diagnosis not present

## 2021-03-11 DIAGNOSIS — J0110 Acute frontal sinusitis, unspecified: Secondary | ICD-10-CM | POA: Diagnosis not present

## 2021-03-11 DIAGNOSIS — J45991 Cough variant asthma: Secondary | ICD-10-CM | POA: Diagnosis not present

## 2021-03-13 ENCOUNTER — Encounter (HOSPITAL_BASED_OUTPATIENT_CLINIC_OR_DEPARTMENT_OTHER): Payer: Self-pay | Admitting: Orthopedic Surgery

## 2021-03-13 ENCOUNTER — Other Ambulatory Visit: Payer: Self-pay

## 2021-03-13 DIAGNOSIS — G629 Polyneuropathy, unspecified: Secondary | ICD-10-CM

## 2021-03-13 DIAGNOSIS — Z9989 Dependence on other enabling machines and devices: Secondary | ICD-10-CM

## 2021-03-13 HISTORY — DX: Dependence on other enabling machines and devices: Z99.89

## 2021-03-13 HISTORY — DX: Polyneuropathy, unspecified: G62.9

## 2021-03-13 NOTE — H&P (Signed)
Preoperative History & Physical Exam  Surgeon: Matt Holmes, MD  Diagnosis: left middle finger mass, left trigger thumb, left thumb ganglion cyst  Planned Procedure: Procedure(s) (LRB): left trigger thumb release (Left) EXCISION MASS,left middle finger ulnar border mass excision,left thumb volar retinacular ganglion cyst excision (Left)  History of Present Illness:   Patient is a 60 y.o. female with symptoms consistent with left middle finger mass, left trigger thumb, left thumb ganglion cyst who presents for surgical intervention. The risks, benefits and alternatives of surgical intervention were discussed and informed consent was obtained prior to surgery.  Past Medical History:  Past Medical History:  Diagnosis Date   Ambulates with cane 03/13/2021   Anxiety    Arthritis    Asthma    CHF (congestive heart failure) (HCC)    COPD (chronic obstructive pulmonary disease) ([place])    Coronary artery disease    Depression    Diabetes mellitus without complication (HCC)    type 2    Fibromyalgia    Gallstones    GERD (gastroesophageal reflux disease)    HLD (hyperlipidemia)    Hypertension    Lupus (HCC)    Migraines    Myocardial infarction ([place]) 2017   Neuromuscular disorder ([place])    Osteoporosis    Pancreatitis 05/27/2019   none since   Peripheral neuropathy 03/13/2021   all over pt pt   Shingles    Sleep apnea    sleep study 03-03-2017 mild osa no cpap used per pt   Stroke ([place]) 10/2015   no residual from   Uses walker 03/13/2021   Wears glasses     Past Surgical History:  Past Surgical History:  Procedure Laterality Date   ABDOMINAL HYSTERECTOMY  1989   ovaries left   APPENDECTOMY  1989   BREAST LUMPECTOMY WITH RADIOACTIVE SEED LOCALIZATION Left 06/14/2019   Procedure: LEFT BREAST LUMPECTOMY WITH RADIOACTIVE SEED LOCALIZATION X 2;  Surgeon: Coralie Keens, MD;  Location: [facility];  Service: General;  Laterality: Left;  LMA   CHOLECYSTECTOMY  1989   IR  GENERIC HISTORICAL  11/07/2015   IR ANGIO INTRA EXTRACRAN SEL COM CAROTID INNOMINATE BILAT MOD SED 11/07/2015 Luanne Bras, MD [facility]   IR GENERIC HISTORICAL  11/07/2015   IR ANGIO VERTEBRAL SEL SUBCLAVIAN INNOMINATE UNI R MOD SED 11/07/2015 Luanne Bras, MD [facility]   IR GENERIC HISTORICAL  11/07/2015   IR ANGIO VERTEBRAL SEL VERTEBRAL UNI L MOD SED 11/07/2015 Luanne Bras, MD [facility]   IR GENERIC HISTORICAL  11/07/2015   IR ANGIOGRAM EXTREMITY LEFT 11/07/2015 Luanne Bras, MD [facility]   TONSILLECTOMY  2000   adenoids removed   TOTAL KNEE ARTHROPLASTY Left 03/12/2017   Procedure: LEFT TOTAL KNEE ARTHROPLASTY;  Surgeon: Mcarthur Rossetti, MD;  Location: [facility];  Service: Orthopedics;  Laterality: Left;  Adductor Block    Medications:  Prior to Admission medications   Medication Sig Start Date End Date Taking? Authorizing Provider  ACCU-CHEK GUIDE test strip 1 EACH BY OTHER ROUTE FOUR TIMES DAILY DAILY FOR TESTING. 01/14/21   Binnie Rail, MD  Accu-Chek Softclix Lancets lancets 1 each by Other route 4 (four) times daily. E11.65 10/15/20   Binnie Rail, MD  albuterol (VENTOLIN HFA) 108 (90 Base) MCG/ACT inhaler Inhale 2 puffs into the lungs every 6 (six) hours as needed for wheezing or shortness of breath. 10/01/20   Burns, Claudina Lick, MD  amitriptyline (ELAVIL) 75 MG tablet Take 1 tablet by mouth at bedtime  Patient taking differently: Take 75 mg by mouth at bedtime. 06/05/20   Binnie Rail, MD  ascorbic acid (VITAMIN C) 500 MG tablet Take 1 tablet (500 mg total) by mouth daily. 09/04/20   Shelly Coss, MD  aspirin EC 81 MG tablet Take 1 tablet (81 mg total) by mouth daily. 11/01/15   Elgergawy, Silver Huguenin, MD  atorvastatin (LIPITOR) 20 MG tablet Take 1 tablet by mouth every day 11/12/20   Binnie Rail, MD  Blood Glucose Monitoring Suppl (ACCU-CHEK GUIDE) w/Device KIT 1 each by Does not apply route 4 (four) times daily. E11.65    [provider]  busPIRone (BUSPAR) 5 MG tablet Take 1 tablet (5 mg total) by mouth 3 (three) times daily. 01/07/21   Binnie Rail, MD  Cholecalciferol (VITAMIN D3) 50 MCG (2000 UT) capsule Take 1 capsule (2,000 Units total) by mouth daily. 01/28/21   Binnie Rail, MD  cloNIDine (CATAPRES) 0.3 MG tablet Take 1 tablet (0.3 mg total) by mouth 2 (two) times daily. 01/28/21   Binnie Rail, MD  fluticasone (FLONASE) 50 MCG/ACT nasal spray Shake liquid & use 1 spray into each nostril every day as needed for allergies Patient taking differently: Place 1 spray into both nostrils daily as needed for allergies. 06/04/20   Binnie Rail, MD  fluticasone furoate-vilanterol (BREO ELLIPTA) 100-25 MCG/INH AEPB Inhale 1 puff into the lungs daily. 10/01/20   Binnie Rail, MD  furosemide (LASIX) 80 MG tablet Take 1 tablet by mouth every day Patient taking differently: Take 80 mg by mouth daily. 06/04/20   Binnie Rail, MD  gabapentin (NEURONTIN) 600 MG tablet Take 2 tablets (1,200 mg total) by mouth 2 (two) times daily. 01/28/21   Binnie Rail, MD  hydrOXYzine (ATARAX/VISTARIL) 25 MG tablet Take 25 mg by mouth at bedtime. 12/17/20   [provider]  ibuprofen (ADVIL) 600 MG tablet Take 1 tablet (600 mg total) by mouth 2 (two) times daily as needed for moderate pain. 01/28/21   Burns, Claudina Lick, MD  insulin glargine, 2 Unit Dial, (TOUJEO MAX SOLOSTAR) 300 UNIT/ML Solostar Pen Inject 180 Units into the skin every morning. And pen needles 1/day Patient taking differently: Inject 160 Units into the skin every evening. And pen needles 1/day 09/13/20   Renato Shin, MD  Insulin Pen Needle (PEN NEEDLES) 32G X 5 MM MISC UAD daily for insulin 01/01/21   Renato Shin, MD  Iron, Ferrous Sulfate, 325 (65 Fe) MG TABS Take 325 mg by mouth daily. 01/28/21   Binnie Rail, MD  losartan (COZAAR) 50 MG tablet Take 1 tablet by mouth twice daily Patient taking differently: Take 50 mg by mouth 2 (two) times daily. 06/04/20   Binnie Rail, MD  metoprolol (TOPROL-XL) 200 MG 24 hr tablet Take 1 tablet (200 mg total) by mouth daily. 09/03/20   Shelly Coss, MD  NARCAN 4 MG/0.1ML LIQD nasal spray kit Place 1 spray into the nose once. 11/17/19   [provider]  nitroGLYCERIN (NITROSTAT) 0.4 MG SL tablet Place 0.4 mg under the tongue every 5 (five) minutes as needed for chest pain.    [provider]  oxyCODONE-acetaminophen (PERCOCET) 10-325 MG tablet Take 1 tablet by mouth every 4 (four) hours as needed for pain. 10/19/19   [provider]  pantoprazole (PROTONIX) 40 MG tablet Take 1 tablet by mouth twice daily 11/25/20   Binnie Rail, MD  potassium chloride SA (KLOR-CON) 20 MEQ tablet  Take 20 mEq by mouth daily. Hold while on Bactrim    [provider]  SYMBICORT 80-4.5 MCG/ACT inhaler Inhale 2 puffs into the lungs daily as needed 11/12/20   Binnie Rail, MD  tiZANidine (ZANAFLEX) 4 MG tablet Take 1 tablet by mouth at bedtime 01/15/21   Burns, Claudina Lick, MD  TOUJEO SOLOSTAR 300 UNIT/ML Solostar Pen Inject 140 units subcutaneously every morning Patient taking differently: 160 Units. 12/31/20   Renato Shin, MD  ZTLIDO 1.8 % [facility] Apply 1 patch topically daily as needed (knee pain). 06/05/20   [provider]    Allergies:  Metformin and related, Other, Ozempic (0.25 or 0.5 mg-dose) [semaglutide(0.25 or 0.38m-dos)], and Bacitracin  Review of Systems: Negative except per HPI.  Physical Exam: Alert and oriented, NAD Head and neck: no masses, normal alignment CV: pulse intact Pulm: no increased work of breathing, respirations even and unlabored Abdomen: non-distended Extremities: extremities warm and well perfused  LABS: Recent Results (from the past 2160 hour(s))  VITAMIN D 25 Hydroxy (Vit-D Deficiency, Fractures)     Status: Abnormal   Collection Time: 01/07/21 10:10 AM  Result Value Ref Range   VITD 13.87 (L) 30.00 - 100.00 ng/mL  Lipid panel     Status: None   Collection Time:  01/07/21 10:10 AM  Result Value Ref Range   Cholesterol 110 0 - 200 mg/dL    Comment: ATP III Classification       Desirable:  < 200 mg/dL               Borderline High:  200 - 239 mg/dL          High:  > = 240 mg/dL   Triglycerides 104.0 0.0 - 149.0 mg/dL    Comment: Normal:  <150 mg/dLBorderline High:  150 - 199 mg/dL   HDL 43.80 >39.00 mg/dL   VLDL 20.8 0.0 - 40.0 mg/dL   LDL Cholesterol 46 0 - 99 mg/dL   Total CHOL/HDL Ratio 3     Comment:                Men          Women1/2 Average Risk     3.4          3.3Average Risk          5.0          4.42X Average Risk          9.6          7.13X Average Risk          15.0          11.0                       NonHDL 66.55     Comment: NOTE:  Non-HDL goal should be 30 mg/dL higher than patient's LDL goal (i.e. LDL goal of < 70 mg/dL, would have non-HDL goal of < 100 mg/dL)  Hemoglobin A1c     Status: Abnormal   Collection Time: 01/07/21 10:10 AM  Result Value Ref Range   Hgb A1c MFr Bld 8.7 (H) 4.6 - 6.5 %    Comment: Glycemic Control Guidelines for People with Diabetes:Non Diabetic:  <6%Goal of Therapy: <7%Additional Action Suggested:  >8%   TSH     Status: None   Collection Time: 01/07/21 10:10 AM  Result Value Ref Range   TSH 5.02 0.35 - 5.50 uIU/mL  CBC with Differential/Platelet  Status: Abnormal   Collection Time: 01/07/21 10:10 AM  Result Value Ref Range   WBC 9.7 4.0 - 10.5 K/uL   RBC 4.75 3.87 - 5.11 Mil/uL   Hemoglobin 12.6 12.0 - 15.0 g/dL   HCT 39.7 36.0 - 46.0 %   MCV 83.5 78.0 - 100.0 fl   MCHC 31.8 30.0 - 36.0 g/dL   RDW 17.0 (H) 11.5 - 15.5 %   Platelets 278.0 150.0 - 400.0 K/uL   Neutrophils Relative % 41.1 (L) 43.0 - 77.0 %   Lymphocytes Relative 45.3 12.0 - 46.0 %   Monocytes Relative 9.9 3.0 - 12.0 %   Eosinophils Relative 3.1 0.0 - 5.0 %   Basophils Relative 0.6 0.0 - 3.0 %   Neutro Abs 4.0 1.4 - 7.7 K/uL   Lymphs Abs 4.4 (H) 0.7 - 4.0 K/uL   Monocytes Absolute 1.0 0.1 - 1.0 K/uL   Eosinophils Absolute 0.3  0.0 - 0.7 K/uL   Basophils Absolute 0.1 0.0 - 0.1 K/uL  Comprehensive metabolic panel     Status: Abnormal   Collection Time: 01/07/21 10:10 AM  Result Value Ref Range   Sodium 138 135 - 145 mEq/L   Potassium 4.2 3.5 - 5.1 mEq/L   Chloride 99 96 - 112 mEq/L   CO2 32 19 - 32 mEq/L   Glucose, Bld 184 (H) 70 - 99 mg/dL   BUN 8 6 - 23 mg/dL   Creatinine, Ser 0.77 0.40 - 1.20 mg/dL   Total Bilirubin 0.4 0.2 - 1.2 mg/dL   Alkaline Phosphatase 101 39 - 117 U/L   AST 39 (H) 0 - 37 U/L   ALT 23 0 - 35 U/L   Total Protein 7.3 6.0 - 8.3 g/dL   Albumin 3.9 3.5 - 5.2 g/dL   GFR 84.82 >60.00 mL/min    Comment: Calculated using the CKD-EPI Creatinine Equation (2021)   Calcium 8.9 8.4 - 10.5 mg/dL  Vitamin B12     Status: None   Collection Time: 01/07/21 10:10 AM  Result Value Ref Range   Vitamin B-12 417 211 - 911 pg/mL  Iron, TIBC and Ferritin Panel     Status: Abnormal   Collection Time: 01/07/21 10:10 AM  Result Value Ref Range   Iron 29 (L) 45 - 160 mcg/dL   TIBC 381 250 - 450 mcg/dL (calc)   %SAT 8 (L) 16 - 45 % (calc)   Ferritin 23 16 - 232 ng/mL     Complete History and Physical exam available in the office notes  Tammy Strickland

## 2021-03-13 NOTE — Progress Notes (Addendum)
Spoke w/ via phone for pre-op interview---pt Lab needs dos---- I stat per anesthesia, surgery orders requested dr spears epic ib             Lab results------ 04-18-2020 chart/epic COVID test -----patient states asymptomatic no test needed Arrive at -------530 am 03-17-2021 NPO after MN NO Solid Food.  Clear liquids from MN until---430 Med rec completed Medications to take morning of surgery -----albuterol inahler prn/bring inhaler, atorvastatin, buspar, clonidine, breo inhaler, gabapentin, pantaprazole, metoprolol, symbicort Diabetic medication -----take 1/2 dose hs toujeo insulin on 03-16-2021 (take 80 units), no diabetic medications day of surgery Patient instructed no nail polish to be worn day of surgery Patient instructed to bring photo id and insurance card day of surgery Patient aware to have Driver (ride ) / caregiver    for 24 hours after surgery  transportation bring patient, daughter ebony brown will ride ome with transportation and be caregiver night of surgery Patient Special Instructions -----pt given no instructions on 81 mg aspirin from dr spears , last dose of 81 mg aspirin will be day before surgery 03-16-2021 Pre-Op special Istructions -----none Patient verbalized understanding of instructions that were given at this phone interview.  Patient denies shortness of breath, chest pain, fever,  cardiac s & s cough at this phone interview. , pt states last nitro use 2 years ago. Pt uses walker or cane for mobility due to joint issues, does some household chores, cannot climb flight of stairs due to joint issues.pt states no current cardiologist  Addendum: reviewed pt medical history with dr Jeneen Rinks singer mda and per dr Jeneen Rinks singer mda pt meets [facility] guidelines for 03-17-2021 surgery.  PCP; dr Lois Huxley 01-07-2021 epic  Ekg 04-18-2020 chart/epic 1 view chest xray 08-31-2020 epic Echo 11-01-2015 epic Sleep study 11-01-2015 epic (mild osa no cpap used per pt)

## 2021-03-14 NOTE — Progress Notes (Signed)
Left message for patient to come in at 0630 clear liquids until 0530

## 2021-03-16 NOTE — Anesthesia Preprocedure Evaluation (Addendum)
Anesthesia Evaluation  Patient identified by MRN, date of birth, ID band Patient awake    Reviewed: Allergy & Precautions, H&P , NPO status , Patient's Chart, lab work & pertinent test results, reviewed documented beta blocker date and time   Airway Mallampati: II  TM Distance: >3 FB Neck ROM: Full    Dental no notable dental hx. (+) Poor Dentition, Chipped, Missing, Dental Advisory Given,    Pulmonary sleep apnea , COPD,  oxygen dependent, Current Smoker,  2.5L oxygen at night per [place]  8.75 pack year history   Noncompliant with CPAP   Pulmonary exam normal breath sounds clear to auscultation       Cardiovascular hypertension, Pt. on medications and Pt. on home beta blockers + CAD, + Past MI and +CHF  Normal cardiovascular exam Rhythm:Regular Rate:Normal  HLD  Last echo 2017: - Left ventricle: The cavity size was normal. Wall thickness wasincreased in a pattern of mild LVH. Systolic function was normal. The estimated ejection fraction was in the range of 55% to 60%.Wall motion was normal; there were no regional wall motionabnormalities. Doppler parameters are consistent with abnormalleft ventricular relaxation (grade 1 diastolic dysfunction).  - Aortic root: The aortic root was mildly dilated.  - Mitral valve: Calcified annulus.    Neuro/Psych  Headaches, PSYCHIATRIC DISORDERS Anxiety Depression TIACVA    GI/Hepatic Neg liver ROS, GERD  Medicated and Controlled,  Endo/Other  diabetes, Poorly Controlled, Type 2, Oral Hypoglycemic AgentsMorbid obesityBMI 48  Renal/GU negative Renal ROS  negative genitourinary   Musculoskeletal  (+) Arthritis , Osteoarthritis,  Fibromyalgia -peripheral neuropathy    Abdominal (+) + obese,   Peds negative pediatric ROS (+)  Hematology negative hematology ROS (+)   Anesthesia Other Findings Left breast complex sclerosing lesion x 2  Reproductive/Obstetrics negative OB ROS                                                                 Anesthesia Quick Evaluation  Anesthesia Physical  Anesthesia Plan  ASA: 3  Anesthesia Plan: MAC   Post-op Pain Management: Ofirmev IV (intra-op)   Induction: Intravenous  PONV Risk Score and Plan: 3 and Ondansetron, Dexamethasone and Treatment may vary due to age or medical condition  Airway Management Planned: Nasal Cannula, Natural Airway, Simple Face Mask and Mask  Additional Equipment: None  Intra-op Plan:   Post-operative Plan:   Informed Consent: I have reviewed the patients History and Physical, chart, labs and discussed the procedure including the risks, benefits and alternatives for the proposed anesthesia with the patient or authorized representative who has indicated his/her understanding and acceptance.     Dental advisory given  Plan Discussed with: CRNA and Anesthesiologist  Anesthesia Plan Comments: ( )      Anesthesia Quick Evaluation

## 2021-03-17 ENCOUNTER — Ambulatory Visit (HOSPITAL_BASED_OUTPATIENT_CLINIC_OR_DEPARTMENT_OTHER)
Admission: RE | Admit: 2021-03-17 | Discharge: 2021-03-17 | Disposition: A | Discharge status: Home / Self Care | Payer: Medicare Other | Attending: Orthopedic Surgery | Admitting: Orthopedic Surgery

## 2021-03-17 ENCOUNTER — Encounter (HOSPITAL_BASED_OUTPATIENT_CLINIC_OR_DEPARTMENT_OTHER)
Admission: RE | Disposition: A | Discharge status: Home / Self Care | Payer: Self-pay | Source: Home / Self Care | Attending: Orthopedic Surgery

## 2021-03-17 ENCOUNTER — Ambulatory Visit (HOSPITAL_BASED_OUTPATIENT_CLINIC_OR_DEPARTMENT_OTHER): Payer: Medicare Other | Admitting: Anesthesiology

## 2021-03-17 ENCOUNTER — Other Ambulatory Visit: Payer: Self-pay

## 2021-03-17 ENCOUNTER — Telehealth: Payer: Self-pay | Admitting: Podiatry

## 2021-03-17 ENCOUNTER — Encounter (HOSPITAL_BASED_OUTPATIENT_CLINIC_OR_DEPARTMENT_OTHER): Payer: Self-pay | Admitting: Orthopedic Surgery

## 2021-03-17 DIAGNOSIS — M65312 Trigger thumb, left thumb: Secondary | ICD-10-CM | POA: Insufficient documentation

## 2021-03-17 DIAGNOSIS — F17200 Nicotine dependence, unspecified, uncomplicated: Secondary | ICD-10-CM | POA: Insufficient documentation

## 2021-03-17 DIAGNOSIS — E119 Type 2 diabetes mellitus without complications: Secondary | ICD-10-CM | POA: Diagnosis not present

## 2021-03-17 DIAGNOSIS — R2232 Localized swelling, mass and lump, left upper limb: Secondary | ICD-10-CM | POA: Insufficient documentation

## 2021-03-17 DIAGNOSIS — Z9981 Dependence on supplemental oxygen: Secondary | ICD-10-CM | POA: Insufficient documentation

## 2021-03-17 DIAGNOSIS — K219 Gastro-esophageal reflux disease without esophagitis: Secondary | ICD-10-CM | POA: Insufficient documentation

## 2021-03-17 DIAGNOSIS — Z79899 Other long term (current) drug therapy: Secondary | ICD-10-CM | POA: Insufficient documentation

## 2021-03-17 DIAGNOSIS — Z6841 Body Mass Index (BMI) 40.0 and over, adult: Secondary | ICD-10-CM | POA: Diagnosis not present

## 2021-03-17 DIAGNOSIS — I252 Old myocardial infarction: Secondary | ICD-10-CM | POA: Insufficient documentation

## 2021-03-17 DIAGNOSIS — E785 Hyperlipidemia, unspecified: Secondary | ICD-10-CM | POA: Diagnosis not present

## 2021-03-17 DIAGNOSIS — J449 Chronic obstructive pulmonary disease, unspecified: Secondary | ICD-10-CM | POA: Diagnosis not present

## 2021-03-17 DIAGNOSIS — G4730 Sleep apnea, unspecified: Secondary | ICD-10-CM | POA: Diagnosis not present

## 2021-03-17 DIAGNOSIS — M797 Fibromyalgia: Secondary | ICD-10-CM | POA: Diagnosis not present

## 2021-03-17 DIAGNOSIS — Z7984 Long term (current) use of oral hypoglycemic drugs: Secondary | ICD-10-CM | POA: Diagnosis not present

## 2021-03-17 DIAGNOSIS — I110 Hypertensive heart disease with heart failure: Secondary | ICD-10-CM | POA: Insufficient documentation

## 2021-03-17 DIAGNOSIS — E559 Vitamin D deficiency, unspecified: Secondary | ICD-10-CM | POA: Diagnosis not present

## 2021-03-17 DIAGNOSIS — I2510 Atherosclerotic heart disease of native coronary artery without angina pectoris: Secondary | ICD-10-CM | POA: Diagnosis not present

## 2021-03-17 DIAGNOSIS — I509 Heart failure, unspecified: Secondary | ICD-10-CM | POA: Diagnosis not present

## 2021-03-17 DIAGNOSIS — M67442 Ganglion, left hand: Secondary | ICD-10-CM | POA: Diagnosis not present

## 2021-03-17 DIAGNOSIS — Z91199 Patient's noncompliance with other medical treatment and regimen due to unspecified reason: Secondary | ICD-10-CM | POA: Insufficient documentation

## 2021-03-17 DIAGNOSIS — D2112 Benign neoplasm of connective and other soft tissue of left upper limb, including shoulder: Secondary | ICD-10-CM | POA: Diagnosis not present

## 2021-03-17 HISTORY — PX: TRIGGER FINGER RELEASE: SHX641

## 2021-03-17 HISTORY — PX: MASS EXCISION: SHX2000

## 2021-03-17 HISTORY — DX: Presence of spectacles and contact lenses: Z97.3

## 2021-03-17 LAB — BASIC METABOLIC PANEL
Anion gap: 9 (ref 5–15)
BUN: 9 mg/dL (ref 6–20)
CO2: 25 mmol/L (ref 22–32)
Calcium: 8.8 mg/dL — ABNORMAL LOW (ref 8.9–10.3)
Chloride: 100 mmol/L (ref 98–111)
Creatinine, Ser: 0.64 mg/dL (ref 0.44–1.00)
GFR, Estimated: >60 mL/min (ref >60)
Glucose, Bld: 201 mg/dL — ABNORMAL HIGH (ref 70–99)
Potassium: 4.2 mmol/L (ref 3.5–5.1)
Sodium: 134 mmol/L — ABNORMAL LOW (ref 135–145)

## 2021-03-17 LAB — GLUCOSE, CAPILLARY: Glucose-Capillary: 168 mg/dL — ABNORMAL HIGH (ref 70–99)

## 2021-03-17 SURGERY — RELEASE, A1 PULLEY, FOR TRIGGER FINGER
Anesthesia: Monitor Anesthesia Care | Site: Hand | Laterality: Left

## 2021-03-17 MED ORDER — PROPOFOL 10 MG/ML IV BOLUS
INTRAVENOUS | Status: AC
  Filled 2021-03-17: qty 20

## 2021-03-17 MED ORDER — HYDROCODONE-ACETAMINOPHEN 5-325 MG PO TABS: 1.0000 | ORAL_TABLET | Freq: Four times a day (QID) | ORAL | 0 refills | Status: AC | PRN

## 2021-03-17 MED ORDER — OXYCODONE HCL 5 MG PO TABS: 5.0000 mg | ORAL_TABLET | Freq: Once | ORAL | Status: DC | PRN

## 2021-03-17 MED ORDER — LACTATED RINGERS IV SOLN: INTRAVENOUS | Status: DC

## 2021-03-17 MED ORDER — PROPOFOL 1000 MG/100ML IV EMUL
INTRAVENOUS | Status: AC
  Filled 2021-03-17: qty 100

## 2021-03-17 MED ORDER — MIDAZOLAM HCL 2 MG/2ML IJ SOLN
INTRAMUSCULAR | Status: AC
  Filled 2021-03-17: qty 2

## 2021-03-17 MED ORDER — FENTANYL CITRATE (PF) 100 MCG/2ML IJ SOLN
25.0000 ug | INTRAMUSCULAR | Status: DC | PRN
  Administered 2021-03-17: 25 ug via INTRAVENOUS

## 2021-03-17 MED ORDER — MEPERIDINE HCL 25 MG/ML IJ SOLN: 6.2500 mg | INTRAMUSCULAR | Status: DC | PRN

## 2021-03-17 MED ORDER — LIDOCAINE HCL (CARDIAC) PF 100 MG/5ML IV SOSY
PREFILLED_SYRINGE | INTRAVENOUS | Status: DC | PRN
  Administered 2021-03-17: 20 mg via INTRAVENOUS

## 2021-03-17 MED ORDER — CEFAZOLIN IN SODIUM CHLORIDE 3-0.9 GM/100ML-% IV SOLN
3.0000 g | INTRAVENOUS | Status: AC
  Administered 2021-03-17: 3 g via INTRAVENOUS

## 2021-03-17 MED ORDER — FENTANYL CITRATE (PF) 100 MCG/2ML IJ SOLN
INTRAMUSCULAR | Status: AC
  Filled 2021-03-17: qty 2

## 2021-03-17 MED ORDER — CEFAZOLIN IN SODIUM CHLORIDE 3-0.9 GM/100ML-% IV SOLN
INTRAVENOUS | Status: AC
  Filled 2021-03-17: qty 100

## 2021-03-17 MED ORDER — BUPIVACAINE HCL 0.5 % IJ SOLN
INTRAMUSCULAR | Status: DC | PRN
  Administered 2021-03-17: 10 mL

## 2021-03-17 MED ORDER — PROPOFOL 500 MG/50ML IV EMUL
INTRAVENOUS | Status: DC | PRN
  Administered 2021-03-17: 50 ug/kg/min via INTRAVENOUS

## 2021-03-17 MED ORDER — ONDANSETRON HCL 4 MG/2ML IJ SOLN: 4.0000 mg | Freq: Once | INTRAMUSCULAR | Status: DC | PRN

## 2021-03-17 MED ORDER — LIDOCAINE HCL (PF) 1 % IJ SOLN
INTRAMUSCULAR | Status: DC | PRN
  Administered 2021-03-17: 10 mL

## 2021-03-17 MED ORDER — ACETAMINOPHEN 160 MG/5ML PO SOLN: 325.0000 mg | ORAL | Status: DC | PRN

## 2021-03-17 MED ORDER — ONDANSETRON HCL 4 MG/2ML IJ SOLN
INTRAMUSCULAR | Status: DC | PRN
  Administered 2021-03-17: 4 mg via INTRAVENOUS

## 2021-03-17 MED ORDER — SODIUM CHLORIDE 0.9 % IR SOLN
Status: DC | PRN
  Administered 2021-03-17: 500 mL

## 2021-03-17 MED ORDER — OXYCODONE HCL 5 MG/5ML PO SOLN: 5.0000 mg | Freq: Once | ORAL | Status: DC | PRN

## 2021-03-17 MED ORDER — PROPOFOL 10 MG/ML IV BOLUS
INTRAVENOUS | Status: DC | PRN
  Administered 2021-03-17: 50 mg via INTRAVENOUS

## 2021-03-17 MED ORDER — ACETAMINOPHEN 325 MG PO TABS: 325.0000 mg | ORAL_TABLET | ORAL | Status: DC | PRN

## 2021-03-17 SURGICAL SUPPLY — 43 items
BLADE SURG 15 STRL LF DISP TIS (BLADE) ×1 IMPLANT
BLADE SURG 15 STRL SS (BLADE) ×2
BNDG CMPR 9X4 STRL LF SNTH (GAUZE/BANDAGES/DRESSINGS) ×1
BNDG ELASTIC 4X5.8 VLCR STR LF (GAUZE/BANDAGES/DRESSINGS) ×2 IMPLANT
BNDG ESMARK 4X9 LF (GAUZE/BANDAGES/DRESSINGS) ×2 IMPLANT
CORD BIPOLAR FORCEPS 12FT (ELECTRODE) ×2 IMPLANT
COVER BACK TABLE 60X90IN (DRAPES) ×2 IMPLANT
CUFF TOURN SGL QUICK 18X4 (TOURNIQUET CUFF) ×2 IMPLANT
CUFF TOURN SGL QUICK 24 (TOURNIQUET CUFF)
CUFF TRNQT CYL 24X4X16.5-23 (TOURNIQUET CUFF) IMPLANT
DRAPE EXTREMITY T 121X128X90 (DISPOSABLE) ×2 IMPLANT
DRAPE ORTHO SPLIT 77X108 STRL (DRAPES) ×2
DRAPE SHEET LG 3/4 BI-LAMINATE (DRAPES) ×2 IMPLANT
DRAPE SURG 17X23 STRL (DRAPES) ×2 IMPLANT
DRAPE SURG ORHT 6 SPLT 77X108 (DRAPES) ×1 IMPLANT
ELECT REM PT RETURN 9FT ADLT (ELECTROSURGICAL)
ELECTRODE REM PT RTRN 9FT ADLT (ELECTROSURGICAL) IMPLANT
GAUZE 4X4 16PLY ~~LOC~~+RFID DBL (SPONGE) ×2 IMPLANT
GAUZE SPONGE 4X4 12PLY STRL (GAUZE/BANDAGES/DRESSINGS) ×2 IMPLANT
GAUZE SPONGE 4X4 12PLY STRL LF (GAUZE/BANDAGES/DRESSINGS) ×2 IMPLANT
GAUZE XEROFORM 1X8 LF (GAUZE/BANDAGES/DRESSINGS) ×2 IMPLANT
GLOVE SURG ENC MOIS LTX SZ7.5 (GLOVE) ×2 IMPLANT
GOWN STRL REUS W/ TWL LRG LVL3 (GOWN DISPOSABLE) ×1 IMPLANT
GOWN STRL REUS W/TWL LRG LVL3 (GOWN DISPOSABLE) ×2
HIBICLENS CHG 4% 4OZ BTL (MISCELLANEOUS) ×2 IMPLANT
KIT TURNOVER CYSTO (KITS) ×2 IMPLANT
MANIFOLD NEPTUNE II (INSTRUMENTS) ×2 IMPLANT
NEEDLE HYPO 25X1 1.5 SAFETY (NEEDLE) ×4 IMPLANT
NS IRRIG 1000ML POUR BTL (IV SOLUTION) ×2 IMPLANT
NS IRRIG 500ML POUR BTL (IV SOLUTION) ×2 IMPLANT
PACK BASIN DAY SURGERY FS (CUSTOM PROCEDURE TRAY) ×2 IMPLANT
PAD CAST 4YDX4 CTTN HI CHSV (CAST SUPPLIES) ×1 IMPLANT
PADDING CAST COTTON 4X4 STRL (CAST SUPPLIES) ×2
SPONGE T-LAP 4X18 ~~LOC~~+RFID (SPONGE) IMPLANT
SUT ETHILON 4 0 PS 2 18 (SUTURE) ×4 IMPLANT
SYR 10ML LL (SYRINGE) ×2 IMPLANT
SYR BULB EAR ULCER 3OZ GRN STR (SYRINGE) ×2 IMPLANT
SYR CONTROL 10ML LL (SYRINGE) ×4 IMPLANT
TOWEL OR 17X26 10 PK STRL BLUE (TOWEL DISPOSABLE) ×2 IMPLANT
TRAY DSU PREP LF (CUSTOM PROCEDURE TRAY) ×2 IMPLANT
TUBE CONNECTING 12X1/4 (SUCTIONS) ×2 IMPLANT
UNDERPAD 30X36 HEAVY ABSORB (UNDERPADS AND DIAPERS) ×2 IMPLANT
YANKAUER SUCT BULB TIP NO VENT (SUCTIONS) IMPLANT

## 2021-03-17 NOTE — Progress Notes (Deleted)
60 y.o. [id number] Single Black or African American Not Hispanic or Latino female here for annual exam.      No LMP recorded. Patient has had a hysterectomy.          Sexually active: {yes no:314532}  The current method of family planning is status post hysterectomy.    Exercising: {yes no:314532}  {types:19826} Smoker:  {YES NO:22349}  Health Maintenance: Pap:  1989  History of abnormal Pap:  no MMG:  05/02/19 Bi-rads 4 suspicious Pathology 1/26/21COMPLEX SCLEROSING LESION WITH CALCIFICATIONS of the LEFT breast, inferior posterior  05/18/19 Pathology: left breast FIBROADENOMATOID NODULE WITH CALCIFICATIONS of the Left breast, upper inner quadrant 06/14/19 she had a lumpectomy with Dr Ninfa Linden, benign BMD:   none  Colonoscopy: 2018 normal f/u 10 years  TDaP:  2019  Gardasil: n/a   reports that she has been smoking cigarettes. She has a 8.75 pack-year smoking history. She has never used smokeless tobacco. She reports current alcohol use. She reports that she does not use drugs.  Past Medical History:  Diagnosis Date   Ambulates with cane 03/13/2021   Anxiety    Arthritis    Asthma    CHF (congestive heart failure) (HCC)    COPD (chronic obstructive pulmonary disease) ([place])    Coronary artery disease    Depression    Diabetes mellitus without complication (HCC)    type 2    Fibromyalgia    Gallstones    GERD (gastroesophageal reflux disease)    HLD (hyperlipidemia)    Hypertension    Lupus (HCC)    Migraines    Myocardial infarction ([place]) 2017   Neuromuscular disorder ([place])    Osteoporosis    Pancreatitis 05/27/2019   none since   Peripheral neuropathy 03/13/2021   all over pt pt   Shingles    Sleep apnea    sleep study 03-03-2017 mild osa no cpap used per pt   Stroke ([place]) 10/2015   no residual from   Uses walker 03/13/2021   Wears glasses     Past Surgical History:  Procedure Laterality Date   ABDOMINAL HYSTERECTOMY  1989   ovaries left   APPENDECTOMY  1989    BREAST LUMPECTOMY WITH RADIOACTIVE SEED LOCALIZATION Left 06/14/2019   Procedure: LEFT BREAST LUMPECTOMY WITH RADIOACTIVE SEED LOCALIZATION X 2;  Surgeon: Coralie Keens, MD;  Location: [facility];  Service: General;  Laterality: Left;  LMA   CHOLECYSTECTOMY  1989   IR GENERIC HISTORICAL  11/07/2015   IR ANGIO INTRA EXTRACRAN SEL COM CAROTID INNOMINATE BILAT MOD SED 11/07/2015 Luanne Bras, MD [facility]   IR GENERIC HISTORICAL  11/07/2015   IR ANGIO VERTEBRAL SEL SUBCLAVIAN INNOMINATE UNI R MOD SED 11/07/2015 Luanne Bras, MD [facility]   IR GENERIC HISTORICAL  11/07/2015   IR ANGIO VERTEBRAL SEL VERTEBRAL UNI L MOD SED 11/07/2015 Luanne Bras, MD [facility]   IR GENERIC HISTORICAL  11/07/2015   IR ANGIOGRAM EXTREMITY LEFT 11/07/2015 Luanne Bras, MD [facility]   TONSILLECTOMY  2000   adenoids removed   TOTAL KNEE ARTHROPLASTY Left 03/12/2017   Procedure: LEFT TOTAL KNEE ARTHROPLASTY;  Surgeon: Mcarthur Rossetti, MD;  Location: [facility];  Service: Orthopedics;  Laterality: Left;  Adductor Block    Current Outpatient Medications  Medication Sig Dispense Refill   ACCU-CHEK GUIDE test strip 1 EACH BY OTHER ROUTE FOUR TIMES DAILY DAILY FOR TESTING. 100 strip 2   Accu-Chek Softclix Lancets lancets 1 each by Other route 4 (four) times  daily. E11.65 100 each 0   albuterol (VENTOLIN HFA) 108 (90 Base) MCG/ACT inhaler Inhale 2 puffs into the lungs every 6 (six) hours as needed for wheezing or shortness of breath. 8 g 8   amitriptyline (ELAVIL) 75 MG tablet Take 1 tablet by mouth at bedtime (Patient taking differently: Take 75 mg by mouth at bedtime.) 30 tablet 11   ascorbic acid (VITAMIN C) 500 MG tablet Take 1 tablet (500 mg total) by mouth daily. 7 tablet 0   aspirin EC 81 MG tablet Take 1 tablet (81 mg total) by mouth daily.     atorvastatin (LIPITOR) 20 MG tablet Take 1 tablet by mouth every day 30 tablet 11   Blood Glucose Monitoring Suppl (ACCU-CHEK GUIDE)  w/Device KIT 1 each by Does not apply route 4 (four) times daily. E11.65     busPIRone (BUSPAR) 5 MG tablet Take 1 tablet (5 mg total) by mouth 3 (three) times daily. 90 tablet 1   Cholecalciferol (VITAMIN D3) 50 MCG (2000 UT) capsule Take 1 capsule (2,000 Units total) by mouth daily. 90 capsule 1   cloNIDine (CATAPRES) 0.3 MG tablet Take 1 tablet (0.3 mg total) by mouth 2 (two) times daily. 180 tablet 3   fluticasone (FLONASE) 50 MCG/ACT nasal spray Shake liquid & use 1 spray into each nostril every day as needed for allergies (Patient taking differently: Place 1 spray into both nostrils daily as needed for allergies.) 16 g 11   fluticasone furoate-vilanterol (BREO ELLIPTA) 100-25 MCG/INH AEPB Inhale 1 puff into the lungs daily. 1 each 11   furosemide (LASIX) 80 MG tablet Take 1 tablet by mouth every day (Patient taking differently: Take 80 mg by mouth daily.) 30 tablet 11   gabapentin (NEURONTIN) 600 MG tablet Take 2 tablets (1,200 mg total) by mouth 2 (two) times daily. 360 tablet 0   HYDROcodone-acetaminophen (NORCO/VICODIN) 5-325 MG tablet Take 1 tablet by mouth every 6 (six) hours as needed for up to 3 days for moderate pain. 10 tablet 0   hydrOXYzine (ATARAX/VISTARIL) 25 MG tablet Take 25 mg by mouth at bedtime.     ibuprofen (ADVIL) 600 MG tablet Take 1 tablet (600 mg total) by mouth 2 (two) times daily as needed for moderate pain. 60 tablet 3   insulin glargine, 2 Unit Dial, (TOUJEO MAX SOLOSTAR) 300 UNIT/ML Solostar Pen Inject 180 Units into the skin every morning. And pen needles 1/day (Patient taking differently: Inject 160 Units into the skin every evening. And pen needles 1/day) 60 mL 3   Insulin Pen Needle (PEN NEEDLES) 32G X 5 MM MISC UAD daily for insulin 90 each 3   Iron, Ferrous Sulfate, 325 (65 Fe) MG TABS Take 325 mg by mouth daily. 90 tablet 1   losartan (COZAAR) 50 MG tablet Take 1 tablet by mouth twice daily (Patient taking differently: Take 50 mg by mouth 2 (two) times daily.)  60 tablet 11   metoprolol (TOPROL-XL) 200 MG 24 hr tablet Take 1 tablet (200 mg total) by mouth daily. 60 tablet 11   NARCAN 4 MG/0.1ML LIQD nasal spray kit Place 1 spray into the nose once.     nitroGLYCERIN (NITROSTAT) 0.4 MG SL tablet Place 0.4 mg under the tongue every 5 (five) minutes as needed for chest pain.     pantoprazole (PROTONIX) 40 MG tablet Take 1 tablet by mouth twice daily 60 tablet 11   potassium chloride SA (KLOR-CON) 20 MEQ tablet Take 20 mEq by mouth daily. Hold while on  Bactrim     SYMBICORT 80-4.5 MCG/ACT inhaler Inhale 2 puffs into the lungs daily as needed 10.2 each 11   tiZANidine (ZANAFLEX) 4 MG tablet Take 1 tablet by mouth at bedtime 30 tablet 3   TOUJEO SOLOSTAR 300 UNIT/ML Solostar Pen Inject 140 units subcutaneously every morning (Patient taking differently: 160 Units.) 18 mL 11   ZTLIDO 1.8 % PTCH Apply 1 patch topically daily as needed (knee pain).     No current facility-administered medications for this visit.   Facility-Administered Medications Ordered in Other Visits  Medication Dose Route Frequency Provider Last Rate Last Admin   acetaminophen (TYLENOL) tablet 325-650 mg  325-650 mg Oral Q4H PRN Janeece Riggers, MD       Or   acetaminophen (TYLENOL) 160 MG/5ML solution 325-650 mg  325-650 mg Oral Q4H PRN Janeece Riggers, MD       fentaNYL (SUBLIMAZE) injection 25-50 mcg  25-50 mcg Intravenous Q5 min PRN Janeece Riggers, MD   25 mcg at 03/17/21 0762   lactated ringers infusion   Intravenous Continuous Myrtie Soman, MD 50 mL/hr at 03/17/21 2633 Restarted at 03/17/21 0830   meperidine (DEMEROL) injection 6.25-12.5 mg  6.25-12.5 mg Intravenous Q5 min PRN Janeece Riggers, MD       ondansetron [facility]) injection 4 mg  4 mg Intravenous Once PRN Janeece Riggers, MD       oxyCODONE (Oxy IR/ROXICODONE) immediate release tablet 5 mg  5 mg Oral Once PRN Janeece Riggers, MD       Or   oxyCODONE (ROXICODONE) 5 MG/5ML solution 5 mg  5 mg Oral Once PRN Janeece Riggers, MD         Family History  Problem Relation Age of Onset   Hyperlipidemia Mother    Hypertension Mother    Stroke Mother    Thyroid disease Mother    Heart attack Mother    Hyperlipidemia Father    Hypertension Father    Stroke Father    Heart attack Father    Hypertension Sister    Stroke Sister    Thyroid disease Sister    Breast cancer Sister    Crohn's disease Sister    Hypertension Sister    Hypertension Brother    Diabetes Brother    Hypertension Brother     Review of Systems  Exam:   There were no vitals taken for this visit.  Weight change: _0 @ Height:      Ht Readings from Last 3 Encounters:  03/17/21 5' 6" (1.676 m)  01/28/21 5' 6" (1.676 m)  01/28/21 5' 6" (1.676 m)    General appearance: alert, cooperative and appears stated age Head: Normocephalic, without obvious abnormality, atraumatic Neck: no adenopathy, supple, symmetrical, trachea midline and thyroid {CHL AMB PHY EX THYROID NORM [phone]::"normal to inspection and palpation"} Lungs: clear to auscultation bilaterally Cardiovascular: regular rate and rhythm Breasts: {Exam; breast:13139::"normal appearance, no masses or tenderness"} Abdomen: soft, non-tender; non distended,  no masses,  no organomegaly Extremities: extremities normal, atraumatic, no cyanosis or edema Skin: Skin color, texture, turgor normal. No rashes or lesions Lymph nodes: Cervical, supraclavicular, and axillary nodes normal. No abnormal inguinal nodes palpated Neurologic: Grossly normal   Pelvic: External genitalia:  no lesions              Urethra:  normal appearing urethra with no masses, tenderness or lesions              Bartholins and Skenes: normal  Vagina: normal appearing vagina with normal color and discharge, no lesions              Cervix: {CHL AMB PHY EX CERVIX NORM [phone]::"no lesions"}               Bimanual Exam:  Uterus:  {CHL AMB PHY EX UTERUS NORM  DEFAULT:[phone]::"normal size, contour, position, consistency, mobility, non-tender"}              Adnexa: {CHL AMB PHY EX ADNEXA NO MASS DEFAULT:[phone]::"no mass, fullness, tenderness"}               Rectovaginal: Confirms               Anus:  normal sphincter tone, no lesions  *** chaperoned for the exam.  A:  Well Woman with normal exam  P:

## 2021-03-17 NOTE — Transfer of Care (Signed)
Immediate Anesthesia Transfer of Care Note  Patient: Tammy Strickland  Procedure(s) Performed: left trigger thumb release (Left: Hand) EXCISION MASS,left middle finger ulnar border mass excision,left thumb volar retinacular ganglion cyst excision (Left: Hand)  Patient Location: PACU  Anesthesia Type:MAC  Level of Consciousness: awake, alert , oriented and patient cooperative  Airway & Oxygen Therapy: Patient Spontanous Breathing  Post-op Assessment: Report given to RN and Post -op Vital signs reviewed and stable  Post vital signs: Reviewed and stable  Last Vitals:  Vitals Value Taken Time  BP    Temp    Pulse    Resp    SpO2      Last Pain:  Vitals:   03/17/21 0611  TempSrc: Oral  PainSc: 0-No pain         Complications: No notable events documented.

## 2021-03-17 NOTE — Interval H&P Note (Signed)
History and Physical Interval Note:  03/17/2021 8:25 AM  Tammy Strickland  has presented today for surgery, with the diagnosis of left middle finger mass, left trigger thumb, left thumb ganglion cyst.  The various methods of treatment have been discussed with the patient and family. After consideration of risks, benefits and other options for treatment, the patient has consented to  Procedure(s) with comments: left trigger thumb release (Left) - with local anesthesia EXCISION MASS,left middle finger ulnar border mass excision,left thumb volar retinacular ganglion cyst excision (Left) as a surgical intervention.  The patient's history has been reviewed, patient examined, no change in status, stable for surgery.  I have reviewed the patient's chart and labs.  Questions were answered to the patient's satisfaction.     Orene Desanctis

## 2021-03-17 NOTE — Op Note (Signed)
OPERATIVE NOTE  DATE OF PROCEDURE: 03/17/2021  SURGEONS:  Primary: Orene Desanctis, MD  PREOPERATIVE DIAGNOSIS: left middle finger mass, left trigger thumb, left thumb ganglion cyst  POSTOPERATIVE DIAGNOSIS: Same  NAME OF PROCEDURE:   Left middle finger mass excision, deep Left thumb mass excision, deep Left thumb A1 pulley release  ANESTHESIA: Monitor Anesthesia Care + Local  SKIN PREPARATION: Hibiclens  ESTIMATED BLOOD LOSS: Minimal  IMPLANTS: none  SPECIMEN: x2 - sent to path (left thumb mass, left middle finger mass)  INDICATIONS:  Tammy Strickland is a 60 y.o. female who has the above preoperative diagnosis. The patient has decided to proceed with surgical intervention.  Risks, benefits and alternatives of operative management were discussed including, but not limited to, risks of anesthesia complications, infection, pain, persistent symptoms, stiffness, need for future surgery.  The patient understands, agrees and elects to proceed with surgery.    DESCRIPTION OF PROCEDURE: The patient was met in the pre-operative area and their identity was verified.  The operative location and laterality was also verified and marked.  The patient was brought to the OR and was placed supine on the table.  After repeat patient identification with the operative team anesthesia was provided and the patient was prepped and draped in the usual sterile fashion.  A final timeout was performed verifying the correction patient, procedure, location and laterality.  Local anesthesia was provided and then the left upper extremity was elevated and exsanguinated with an Esmarch and tourniquet inflated to 250 mmHg.  A transverse incision was made over the left thumb A1 pulley.  Skin and subcutaneous tissues were divided.  The neurovascular bundles were identified radially and ulnarly and protected with retractors.  The left thumb mass was encountered and was consistent with a ganglion cyst.  This was completely excised  down to the level of the tendon sheath.  This was sent to pathology and labeled left thumb mass.  This completed the left thumb mass excision portion of the procedure.  At this time attention was turned to further dissection down to the level of the A1 pulley of the left thumb.  The flexor pollicis longus tendon was identified and the A1 pulley sheath was completely released both proximally and distally.  This completed the left thumb A1 pulley trigger thumb release.  At this time attention was turned to the ulnar border of the left middle finger at the level of the middle phalanx.  A mid lateral incision was made over the ulnar border.  Skin and subcutaneous tissues were divided.  The neurovascular bundle was protected and dissected off of the mass.  A brownish-black appearing multiloculated firm mass was identified.  This was not adherent to the surrounding structures and was easily dissected with a tenotomy scissors.  This mass was removed in its entirety.  This was then sent to pathology and labeled left middle finger mass.  The wounds were thoroughly irrigated with normal saline and closed with 4-0 nylon horizontal mattress sutures followed by a sterile soft dressing.  The tourniquet was deflated and the fingers were pink and warm and well-perfused.  The patient tolerated the procedure well.  She was awoken from anesthesia brought to PACU for recovery in stable condition.  At the end the case all counts were correct x2.   Matt Holmes, MD

## 2021-03-17 NOTE — Anesthesia Postprocedure Evaluation (Signed)
Anesthesia Post Note  Patient: Tammy Strickland  Procedure(s) Performed: left trigger thumb release (Left: Hand) EXCISION MASS,left middle finger ulnar border mass excision,left thumb volar retinacular ganglion cyst excision (Left: Hand)     Patient location during evaluation: PACU Anesthesia Type: MAC Level of consciousness: awake and alert Pain management: pain level controlled Vital Signs Assessment: post-procedure vital signs reviewed and stable Respiratory status: spontaneous breathing, nonlabored ventilation, respiratory function stable and patient connected to nasal cannula oxygen Cardiovascular status: stable and blood pressure returned to baseline Postop Assessment: no apparent nausea or vomiting Anesthetic complications: no   No notable events documented.  Last Vitals:  Vitals:   03/17/21 0945 03/17/21 1020  BP: (!) 156/84 (!) 157/96  Pulse: 65 65  Resp: 15 18  Temp:  37.1 C  SpO2: 94% 92%    Last Pain:  Vitals:   03/17/21 1020  TempSrc:   PainSc: 6                  Nasir Bright

## 2021-03-17 NOTE — Telephone Encounter (Signed)
Diabetic shoes/inserts in. Lvm for pt to call to schedule an appt to pick them up.

## 2021-03-17 NOTE — Discharge Instructions (Addendum)
Orthopaedic Hand Surgery Discharge Instructions  WEIGHT BEARING STATUS: Non weight bearing on operative extremity  INCISION CARE: Keep dressing over your incision clean and dry until 5 days after surgery. You may shower by placing a waterproof covering over your dressing. Once dressing is removed, you may allow water to run over the incision and then place Band-Aids over incision. Do not scrub your incision or apply creams/lotions. Do not submerge your incision or swim for 3 weeks after surgery. Contact your surgeon or primary care doctor if you develop redness or drainage from your incision.   PAIN CONTROL: First line medications for post operative pain control are Tylenol (acetaminophen) and Motrin (ibuprofen) if you are able to take these medications. If you have been prescribed a medication these can be taken as breakthrough pain medications. Please note that some narcotic pain medication have acetaminophen added and you should never consume more than 4,017m of acetaminophen in 24 hour period. Also please note that if you are given Toradol (ketoralac) you should not take similar medications simultaneously such as ibuprofen.   ICE/ELEVATION: Ice and elevate your injured extremity as needed. Avoid direct contact of ice with skin.  HOME MEDICATIONS: No changes have been made to your home medications.  FOLLOW UP: You will be called after surgery with an appointment date and time, however if you have not received a phone call within 3 days please call during regular office hours at [phone] schedule a post operative appointment.  Please Seek Medical Attention if: Call MD for: pain or pressure in chest, jaw, arm, back, neck  Call MD for: temperature greater than 101 F for more than 24 hours  Call MD for: difficulty breathing Call MD for: Incision redness, bleeding, drainage  Call MD for: palpitations or feeling that the heart is racing  Call MD for: increased swelling in arm, leg, ankle,  or abdomen  Call MD for: lightheadedness, dizziness, fainting Go to ED or call 911 if: chest pain does not go away after 3 nitroglycerin doses taken 5 min apart  Go to ED or call 911 for: any uncontrolled bleeding  Go to ED or call 911 if: unable to reach physician  Discharge Medications: Allergies as of 03/17/2021       Reactions   Metformin And Related Other (See Comments)   Lactic acidosis diarrhea hives   Other Other (See Comments)   H/o pancreatitis   Ozempic (0.25 Or 0.5 Mg-dose) [semaglutide(0.25 Or 0.545mdos)]    H/o pancreatitis   Bacitracin Hives        Medication List     STOP taking these medications    oxyCODONE-acetaminophen 10-325 MG tablet Commonly known as: PERCOCET       TAKE these medications    Accu-Chek Guide test strip Generic drug: glucose blood 1 EACH BY OTHER ROUTE FOUR TIMES DAILY DAILY FOR TESTING.   Accu-Chek Guide w/Device Kit 1 each by Does not apply route 4 (four) times daily. E11.65   Accu-Chek Softclix Lancets lancets 1 each by Other route 4 (four) times daily. E11.65   albuterol 108 (90 Base) MCG/ACT inhaler Commonly known as: VENTOLIN HFA Inhale 2 puffs into the lungs every 6 (six) hours as needed for wheezing or shortness of breath.   amitriptyline 75 MG tablet Commonly known as: ELAVIL Take 1 tablet by mouth at bedtime   ascorbic acid 500 MG tablet Commonly known as: VITAMIN C Take 1 tablet (500 mg total) by mouth daily.   aspirin EC 81 MG tablet  Take 1 tablet (81 mg total) by mouth daily.   atorvastatin 20 MG tablet Commonly known as: LIPITOR Take 1 tablet by mouth every day   busPIRone 5 MG tablet Commonly known as: BUSPAR Take 1 tablet (5 mg total) by mouth 3 (three) times daily.   cloNIDine 0.3 MG tablet Commonly known as: CATAPRES Take 1 tablet (0.3 mg total) by mouth 2 (two) times daily.   fluticasone 50 MCG/ACT nasal spray Commonly known as: FLONASE Shake liquid & use 1 spray into each nostril every  day as needed for allergies What changed: See the new instructions.   fluticasone furoate-vilanterol 100-25 MCG/INH Aepb Commonly known as: BREO ELLIPTA Inhale 1 puff into the lungs daily.   furosemide 80 MG tablet Commonly known as: LASIX Take 1 tablet by mouth every day   gabapentin 600 MG tablet Commonly known as: NEURONTIN Take 2 tablets (1,200 mg total) by mouth 2 (two) times daily.   HYDROcodone-acetaminophen 5-325 MG tablet Commonly known as: NORCO/VICODIN Take 1 tablet by mouth every 6 (six) hours as needed for up to 3 days for moderate pain.   hydrOXYzine 25 MG tablet Commonly known as: ATARAX Take 25 mg by mouth at bedtime.   ibuprofen 600 MG tablet Commonly known as: ADVIL Take 1 tablet (600 mg total) by mouth 2 (two) times daily as needed for moderate pain.   Iron (Ferrous Sulfate) 325 (65 Fe) MG Tabs Take 325 mg by mouth daily.   losartan 50 MG tablet Commonly known as: COZAAR Take 1 tablet by mouth twice daily   metoprolol 200 MG 24 hr tablet Commonly known as: TOPROL-XL Take 1 tablet (200 mg total) by mouth daily.   Narcan 4 MG/0.1ML Liqd nasal spray kit Generic drug: naloxone Place 1 spray into the nose once.   nitroGLYCERIN 0.4 MG SL tablet Commonly known as: NITROSTAT Place 0.4 mg under the tongue every 5 (five) minutes as needed for chest pain.   pantoprazole 40 MG tablet Commonly known as: PROTONIX Take 1 tablet by mouth twice daily   Pen Needles 32G X 5 MM Misc UAD daily for insulin   potassium chloride SA 20 MEQ tablet Commonly known as: KLOR-CON M Take 20 mEq by mouth daily. Hold while on Bactrim   Symbicort 80-4.5 MCG/ACT inhaler Generic drug: budesonide-formoterol Inhale 2 puffs into the lungs daily as needed   tiZANidine 4 MG tablet Commonly known as: ZANAFLEX Take 1 tablet by mouth at bedtime   Toujeo Max SoloStar 300 UNIT/ML Solostar Pen Generic drug: insulin glargine (2 Unit Dial) Inject 180 Units into the skin every  morning. And pen needles 1/day What changed:  how much to take when to take this   Toujeo SoloStar 300 UNIT/ML Solostar Pen Generic drug: insulin glargine (1 Unit Dial) Inject 140 units subcutaneously every morning What changed: See the new instructions.   Vitamin D3 50 MCG (2000 UT) capsule Take 1 capsule (2,000 Units total) by mouth daily.   ZTlido 1.8 % Ptch Generic drug: Lidocaine Apply 1 patch topically daily as needed (knee pain).          Tammy Amana, MD Orthopaedic Hand Surgeon [facility] Office number: [phone] [address] [place], [place] [address]     Post Anesthesia Home Care Instructions  Activity: Get plenty of rest for the remainder of the day. A responsible individual must stay with you for 24 hours following the procedure.  For the next 24 hours, DO NOT: -Drive a car Film/video editor -Drink  alcoholic beverages -Take any medication unless instructed by your physician -Make any legal decisions or sign important papers.  Meals: Start with liquid foods such as gelatin or soup. Progress to regular foods as tolerated. Avoid greasy, spicy, heavy foods. If nausea and/or vomiting occur, drink only clear liquids until the nausea and/or vomiting subsides. Call your physician if vomiting continues.  Special Instructions/Symptoms: Your throat may feel dry or sore from the anesthesia or the breathing tube placed in your throat during surgery. If this causes discomfort, gargle with warm salt water. The discomfort should disappear within 24 hours.

## 2021-03-18 ENCOUNTER — Encounter (HOSPITAL_BASED_OUTPATIENT_CLINIC_OR_DEPARTMENT_OTHER): Payer: Self-pay | Admitting: Orthopedic Surgery

## 2021-03-18 ENCOUNTER — Ambulatory Visit: Payer: Medicare Other

## 2021-03-18 LAB — SURGICAL PATHOLOGY

## 2021-03-19 ENCOUNTER — Telehealth: Payer: Self-pay | Admitting: Internal Medicine

## 2021-03-19 NOTE — Telephone Encounter (Signed)
Pharmacy called to confirm if patient needs hydrocodone because she picked up percocet from another provider over the weekend. Please contact the pharmacy at [phone].

## 2021-03-20 ENCOUNTER — Ambulatory Visit: Payer: Medicare Other | Admitting: Obstetrics and Gynecology

## 2021-03-20 DIAGNOSIS — Z0289 Encounter for other administrative examinations: Secondary | ICD-10-CM

## 2021-03-20 NOTE — Telephone Encounter (Signed)
Spoke with [place] today.  HE will reach out to Dr. Julianne Rice office with pain management to make sure she can have script.   Patient did just have surgery for trigger finger.

## 2021-03-31 ENCOUNTER — Ambulatory Visit: Payer: Medicare Other

## 2021-03-31 ENCOUNTER — Ambulatory Visit (INDEPENDENT_AMBULATORY_CARE_PROVIDER_SITE_OTHER): Payer: Medicare Other | Admitting: Podiatry

## 2021-03-31 ENCOUNTER — Other Ambulatory Visit: Payer: Self-pay

## 2021-03-31 DIAGNOSIS — M25532 Pain in left wrist: Secondary | ICD-10-CM | POA: Diagnosis not present

## 2021-03-31 DIAGNOSIS — E1140 Type 2 diabetes mellitus with diabetic neuropathy, unspecified: Secondary | ICD-10-CM | POA: Diagnosis not present

## 2021-03-31 DIAGNOSIS — M216X9 Other acquired deformities of unspecified foot: Secondary | ICD-10-CM | POA: Diagnosis not present

## 2021-03-31 DIAGNOSIS — E0840 Diabetes mellitus due to underlying condition with diabetic neuropathy, unspecified: Secondary | ICD-10-CM

## 2021-03-31 DIAGNOSIS — Z794 Long term (current) use of insulin: Secondary | ICD-10-CM

## 2021-03-31 NOTE — Progress Notes (Signed)
SITUATION Reason for Visit: Fitting of Diabetic Shoes & Insoles Patient / Caregiver Report:  Patient reports comfort  OBJECTIVE DATA: Patient History / Diagnosis:     ICD-10-CM   1. Diabetes mellitus due to underlying condition with diabetic neuropathy, with long-term current use of insulin (HCC)  E08.40    Z79.4       Change in Status:   None  ACTIONS PERFORMED: In-Person Delivery, patient was fit with: - 1x pair [id number] PDAC approved prefabricated Diabetic Shoes: Orthofeet 885 9W - 3x pair [id number] PDAC approved CAM milled custom diabetic insoles  Shoes and insoles were verified for structural integrity and safety. Patient wore shoes and insoles in office. Skin was inspected and free of areas of concern after wearing shoes and inserts. Shoes and inserts fit properly. Patient / Caregiver provided with ferbal instruction and demonstration regarding donning, doffing, wear, care, proper fit, function, purpose, cleaning, and use of shoes and insoles ' and in all related precautions and risks and benefits regarding shoes and insoles. Patient / Caregiver was instructed to wear properly fitting socks with shoes at all times. Patient was also provided with verbal instruction regarding how to report any failures or malfunctions of shoes or inserts, and necessary follow up care. Patient / Caregiver was also instructed to contact physician regarding change in status that may affect function of shoes and inserts.   Patient / Caregiver verbalized undersatnding of instruction provided. Patient / Caregiver demonstrated independence with proper donning and doffing of shoes and inserts.  PLAN Patient to follow up as needed. Plan of care was discussed with and agreed upon by patient and/or caregiver. All questions were answered and concerns addressed.

## 2021-04-01 NOTE — Progress Notes (Signed)
Patient states that she was only here to pick up diabetic shoes.  She was seen by our orthotist, Guadlupe Spanish.  See his note.  I did not see the patient today but I did offer the patient to be seen by myself.

## 2021-04-05 ENCOUNTER — Other Ambulatory Visit: Payer: Self-pay | Admitting: Internal Medicine

## 2021-04-06 ENCOUNTER — Other Ambulatory Visit: Payer: Self-pay | Admitting: Internal Medicine

## 2021-04-08 ENCOUNTER — Other Ambulatory Visit: Payer: Self-pay | Admitting: Internal Medicine

## 2021-04-08 DIAGNOSIS — M79604 Pain in right leg: Secondary | ICD-10-CM | POA: Diagnosis not present

## 2021-04-08 DIAGNOSIS — M25562 Pain in left knee: Secondary | ICD-10-CM | POA: Diagnosis not present

## 2021-04-08 DIAGNOSIS — M25549 Pain in joints of unspecified hand: Secondary | ICD-10-CM | POA: Diagnosis not present

## 2021-04-08 DIAGNOSIS — M170 Bilateral primary osteoarthritis of knee: Secondary | ICD-10-CM | POA: Diagnosis not present

## 2021-04-08 DIAGNOSIS — M25552 Pain in left hip: Secondary | ICD-10-CM | POA: Diagnosis not present

## 2021-04-08 DIAGNOSIS — G43909 Migraine, unspecified, not intractable, without status migrainosus: Secondary | ICD-10-CM | POA: Diagnosis not present

## 2021-04-08 DIAGNOSIS — J3089 Other allergic rhinitis: Secondary | ICD-10-CM | POA: Diagnosis not present

## 2021-04-08 DIAGNOSIS — G894 Chronic pain syndrome: Secondary | ICD-10-CM | POA: Diagnosis not present

## 2021-04-08 DIAGNOSIS — M797 Fibromyalgia: Secondary | ICD-10-CM | POA: Diagnosis not present

## 2021-04-08 DIAGNOSIS — M545 Low back pain, unspecified: Secondary | ICD-10-CM | POA: Diagnosis not present

## 2021-04-08 DIAGNOSIS — J0110 Acute frontal sinusitis, unspecified: Secondary | ICD-10-CM | POA: Diagnosis not present

## 2021-04-08 DIAGNOSIS — M25579 Pain in unspecified ankle and joints of unspecified foot: Secondary | ICD-10-CM | POA: Diagnosis not present

## 2021-04-09 ENCOUNTER — Other Ambulatory Visit: Payer: Self-pay | Admitting: Internal Medicine

## 2021-04-29 ENCOUNTER — Encounter: Payer: Self-pay | Admitting: Internal Medicine

## 2021-04-29 NOTE — Patient Instructions (Signed)
  Blood work was ordered.     Medications changes include :     Your prescription(s) have been submitted to your pharmacy. Please take as directed and contact our office if you believe you are having problem(s) with the medication(s).   A referral was ordered for        Someone from their office will call you to schedule an appointment.    Please followup in 6 months  

## 2021-04-29 NOTE — Progress Notes (Signed)
Subjective:    Patient ID: Tammy Strickland, female    DOB: Mar 27, 1961, 61 y.o.   MRN: [id number]  This visit occurred during the SARS-CoV-2 public health emergency.  Safety protocols were in place, including screening questions prior to the visit, additional usage of staff PPE, and extensive cleaning of exam room while observing appropriate contact time as indicated for disinfecting solutions.     HPI The patient is here for follow up of their chronic medical problems, including anxiety, htn, DM, neuropathy, hld, elevated lfts, gerd, fatty liver with ? Early cirrhosis, COPD  ? Vit d, iron,-if so consider lipid, CMP  Medications and allergies reviewed with patient and updated if appropriate.  Patient Active Problem List   Diagnosis Date Noted   Iron deficiency 01/28/2021   Chronic pain syndrome 01/28/2021   Boil of buttock 01/28/2021   Ganglion cyst 01/07/2021   Nail discoloration 01/07/2021   Right wrist pain 01/07/2021   COVID-19 virus infection 09/01/2020   Uncontrolled type 2 diabetes mellitus with hyperglycemia, with long-term current use of insulin ([place]) 09/01/2020   Nicotine dependence, cigarettes, uncomplicated 88/50/2774   Thoracic ascending aortic aneurysm 09/01/2020   Mucous retention cyst of lip 07/18/2020   Pica in adults 07/18/2020   Prominent metatarsal head of left foot 06/06/2020   Barrett's esophagus 04/14/2020   Rib pain on right side 11/07/2019   Cough 09/19/2019   Nausea & vomiting 08/15/2019   Pancreatitis 05/27/2019   Severe obesity (BMI >= 40) ([place]) 05/24/2019   TMJ arthralgia 04/26/2019   Unilateral primary osteoarthritis, right knee 01/16/2019   Anxiety 11/01/2018   RUQ pain 06/22/2018   Fatty liver 06/19/2018   Lactic acid acidosis 06/15/2018   Transaminitis 06/15/2018   Chronic bilateral low back pain without sciatica 05/17/2018   It band syndrome, left 12/29/2017   Trochanteric bursitis, left hip 12/29/2017   History of left knee  replacement 04/21/2017   Pain in left ankle and joints of left foot 04/21/2017   Pain in left hand 04/21/2017   OSA (obstructive sleep apnea) 02/23/2017   Acute gouty arthritis 12/25/2016   Stroke ([place])    Chronic fatigue 11/09/2016   Hypoxia 11/09/2016   Morbidly obese ([place]) 11/09/2016   Rash and nonspecific skin eruption 07/21/2016   Frequent headaches 07/21/2016   Allergic rhinitis 07/16/2016   Vitamin D deficiency 04/16/2016   Hyperlipidemia 01/15/2016   Diabetes with neurologic complications ([place]) 12/87/8676   Chronic diastolic congestive heart failure ([place]) 11/06/2015   TIA (transient ischemic attack)    Carotid-cavernous fistula    Insomnia 10/10/2015   Depression 10/10/2015   Benign essential HTN 08/15/2015   Fibromyalgia 08/15/2015   Peripheral neuropathy 08/15/2015   CAD (coronary artery disease) 08/15/2015   COPD (chronic obstructive pulmonary disease) ([place]) 08/15/2015   Osteoporosis 08/15/2015   Left knee DJD 08/15/2015   DDD (degenerative disc disease), lumbar 08/15/2015   Occupational exposure to industrial toxins 08/15/2015   GERD (gastroesophageal reflux disease) 08/15/2015    Current Outpatient Medications on File Prior to Visit  Medication Sig Dispense Refill   tiZANidine (ZANAFLEX) 4 MG tablet Take 1 tablet by mouth at bedtime 30 tablet 2   ACCU-CHEK GUIDE test strip 1 EACH BY OTHER ROUTE FOUR TIMES DAILY DAILY FOR TESTING. 100 strip 2   Accu-Chek Softclix Lancets lancets 1 each by Other route 4 (four) times daily. E11.65 100 each 0   albuterol (VENTOLIN HFA) 108 (90 Base) MCG/ACT inhaler Inhale 2 puffs into the lungs every  6 (six) hours as needed for wheezing or shortness of breath. 8 g 8   amitriptyline (ELAVIL) 75 MG tablet Take 1 tablet (75 mg total) by mouth at bedtime. 30 tablet 0   ascorbic acid (VITAMIN C) 500 MG tablet Take 1 tablet (500 mg total) by mouth daily. 7 tablet 0   aspirin EC 81 MG tablet Take 1 tablet (81 mg total) by mouth daily.      atorvastatin (LIPITOR) 20 MG tablet Take 1 tablet by mouth every day 30 tablet 11   Blood Glucose Monitoring Suppl (ACCU-CHEK GUIDE) w/Device KIT 1 each by Does not apply route 4 (four) times daily. E11.65     busPIRone (BUSPAR) 5 MG tablet TAKE 1 TABLET(5 MG) BY MOUTH THREE TIMES DAILY 90 tablet 1   Cholecalciferol (VITAMIN D3) 50 MCG (2000 UT) capsule Take 1 capsule (2,000 Units total) by mouth daily. 90 capsule 1   cloNIDine (CATAPRES) 0.3 MG tablet Take 1 tablet (0.3 mg total) by mouth 2 (two) times daily. 180 tablet 3   fluticasone (FLONASE) 50 MCG/ACT nasal spray Shake liquid & use 1 spray into each nostril every day as needed for allergies 16 g 11   fluticasone furoate-vilanterol (BREO ELLIPTA) 100-25 MCG/INH AEPB Inhale 1 puff into the lungs daily. 1 each 11   furosemide (LASIX) 80 MG tablet Take 1 tablet by mouth every day 30 tablet 11   gabapentin (NEURONTIN) 600 MG tablet Take 2 tablets (1,200 mg total) by mouth 2 (two) times daily. 360 tablet 0   hydrOXYzine (ATARAX/VISTARIL) 25 MG tablet Take 25 mg by mouth at bedtime.     ibuprofen (ADVIL) 600 MG tablet Take 1 tablet (600 mg total) by mouth 2 (two) times daily as needed for moderate pain. 60 tablet 3   insulin glargine, 2 Unit Dial, (TOUJEO MAX SOLOSTAR) 300 UNIT/ML Solostar Pen Inject 180 Units into the skin every morning. And pen needles 1/day (Patient taking differently: Inject 160 Units into the skin every evening. And pen needles 1/day) 60 mL 3   Insulin Pen Needle (PEN NEEDLES) 32G X 5 MM MISC UAD daily for insulin 90 each 3   Iron, Ferrous Sulfate, 325 (65 Fe) MG TABS Take 325 mg by mouth daily. 90 tablet 1   losartan (COZAAR) 50 MG tablet Take 1 tablet by mouth twice daily 60 tablet 11   metoprolol (TOPROL-XL) 200 MG 24 hr tablet Take 1 tablet by mouth twice daily 60 tablet 11   NARCAN 4 MG/0.1ML LIQD nasal spray kit Place 1 spray into the nose once.     nitroGLYCERIN (NITROSTAT) 0.4 MG SL tablet Place 0.4 mg under the tongue  every 5 (five) minutes as needed for chest pain.     pantoprazole (PROTONIX) 40 MG tablet Take 1 tablet by mouth twice daily 60 tablet 11   potassium chloride (KLOR-CON M) 10 MEQ tablet Take 2 tablets by mouth every day 60 tablet 11   SYMBICORT 80-4.5 MCG/ACT inhaler Inhale 2 puffs into the lungs daily as needed 10.2 each 11   TOUJEO SOLOSTAR 300 UNIT/ML Solostar Pen Inject 140 units subcutaneously every morning (Patient taking differently: 160 Units.) 18 mL 11   ZTLIDO 1.8 % PTCH Apply 1 patch topically daily as needed (knee pain).     No current facility-administered medications on file prior to visit.    Past Medical History:  Diagnosis Date   Ambulates with cane 03/13/2021   Anxiety    Arthritis    Asthma  CHF (congestive heart failure) (HCC)    COPD (chronic obstructive pulmonary disease) (HCC)    Coronary artery disease    Depression    Diabetes mellitus without complication (HCC)    type 2    Fibromyalgia    Gallstones    GERD (gastroesophageal reflux disease)    HLD (hyperlipidemia)    Hypertension    Lupus (HCC)    Migraines    Myocardial infarction (HCC) 2017   Neuromuscular disorder (HCC)    Osteoporosis    Pancreatitis 05/27/2019   none since   Peripheral neuropathy 03/13/2021   all over pt pt   Shingles    Sleep apnea    sleep study 03-03-2017 mild osa no cpap used per pt   Stroke (HCC) 10/2015   no residual from   Uses walker 03/13/2021   Wears glasses     Past Surgical History:  Procedure Laterality Date   ABDOMINAL HYSTERECTOMY  1989   ovaries left   APPENDECTOMY  1989   BREAST LUMPECTOMY WITH RADIOACTIVE SEED LOCALIZATION Left 06/14/2019   Procedure: LEFT BREAST LUMPECTOMY WITH RADIOACTIVE SEED LOCALIZATION X 2;  Surgeon: Abigail Miyamoto, MD;  Location: [facility];  Service: General;  Laterality: Left;  LMA   CHOLECYSTECTOMY  1989   IR GENERIC HISTORICAL  11/07/2015   IR ANGIO INTRA EXTRACRAN SEL COM CAROTID INNOMINATE BILAT MOD SED 11/07/2015  Julieanne Cotton, MD [facility]   IR GENERIC HISTORICAL  11/07/2015   IR ANGIO VERTEBRAL SEL SUBCLAVIAN INNOMINATE UNI R MOD SED 11/07/2015 Julieanne Cotton, MD [facility]   IR GENERIC HISTORICAL  11/07/2015   IR ANGIO VERTEBRAL SEL VERTEBRAL UNI L MOD SED 11/07/2015 Julieanne Cotton, MD [facility]   IR GENERIC HISTORICAL  11/07/2015   IR ANGIOGRAM EXTREMITY LEFT 11/07/2015 Julieanne Cotton, MD [facility]   MASS EXCISION Left 03/17/2021   Procedure: EXCISION MASS,left middle finger ulnar border mass excision,left thumb volar retinacular ganglion cyst excision;  Surgeon: Gomez Cleverly, MD;  Location: [facility];  Service: Orthopedics;  Laterality: Left;   TONSILLECTOMY  2000   adenoids removed   TOTAL KNEE ARTHROPLASTY Left 03/12/2017   Procedure: LEFT TOTAL KNEE ARTHROPLASTY;  Surgeon: Kathryne Hitch, MD;  Location: [facility];  Service: Orthopedics;  Laterality: Left;  Adductor Block   TRIGGER FINGER RELEASE Left 03/17/2021   Procedure: left trigger thumb release;  Surgeon: Gomez Cleverly, MD;  Location[facility];  Service: Orthopedics;  Laterality: Left;  with local anesthesia    Social History   Socioeconomic History   Marital status: Single    Spouse name: n/a   Number of children: 3   Years of education: 12+   Highest education level: Not on file  Occupational History   Occupation: disabled-falling, doesn't recall name of toxin    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 at  [employer] Use   Vaping Use: Never used  Substance and Sexual Activity   Alcohol use: Yes    Comment: occasional   Drug use: No   Sexual activity: Not Currently    Partners: Female    Birth control/protection: Surgical     Comment: hysterectomy  Other Topics Concern  Not on file  Social History Narrative   Moved to [place] from [place], [place] February 2017, to help her daughter.   Lives with her daughter.   Sons live in [place] and [place].   She reports that there were originally 17 children in her family (she is the youngest), the oldest are deceased, some prior to her birth, and she isn't sure which were female/female or how they died.   Social Determinants of Health   Financial Resource Strain: Low Risk    Difficulty of Paying Living Expenses: Not hard at all  Food Insecurity: No Food Insecurity   Worried About Charity fundraiser in the Last Year: Never true   [place] in the Last Year: Never true  Transportation Needs: No Transportation Needs   Lack of Transportation (Medical): No   Lack of Transportation (Non-Medical): No  Physical Activity: Inactive   Days of Exercise per Week: 0 days   Minutes of Exercise per Session: 0 min  Stress: No Stress Concern Present   Feeling of Stress : Not at all  Social Connections: Socially Isolated   Frequency of Communication with Friends and Family: More than three times a week   Frequency of Social Gatherings with Friends and Family: Once a week   Attends Religious Services: Never   Marine scientist or Organizations: No   Attends Music therapist: Never   Marital Status: Never married    Family History  Problem Relation Age of Onset   Hyperlipidemia Mother    Hypertension Mother    Stroke Mother    Thyroid disease Mother    Heart attack Mother    Hyperlipidemia Father    Hypertension Father    Stroke Father    Heart attack Father    Hypertension Sister    Stroke Sister    Thyroid disease Sister    Breast cancer Sister    Crohn's disease Sister    Hypertension Sister    Hypertension Brother    Diabetes Brother    Hypertension Brother     Review of Systems     Objective:  There were no vitals filed for this  visit. BP Readings from Last 3 Encounters:  03/17/21 (!) 157/96  01/28/21 128/82  01/28/21 (!) 146/84   Wt Readings from Last 3 Encounters:  03/17/21 (!) 310 lb 3.2 oz (140.7 kg)  01/28/21 (!) 311 lb 12.8 oz (141.4 kg)  01/28/21 (!) 309 lb (140.2 kg)   There is no height or weight on file to calculate BMI.   Physical Exam    Constitutional: Appears well-developed and well-nourished. No distress.  HENT:  Head: Normocephalic and atraumatic.  Neck: Neck supple. No tracheal deviation present. No thyromegaly present.  No cervical lymphadenopathy Cardiovascular: Normal rate, regular rhythm and normal heart sounds.   No murmur heard. No carotid bruit .  No edema Pulmonary/Chest: Effort normal and breath sounds normal. No respiratory distress. No has no wheezes. No rales.  Skin: Skin is warm and dry. Not diaphoretic.  Psychiatric: Normal mood and affect. Behavior is normal.      Assessment & Plan:    See Problem List for Assessment and Plan of chronic medical problems.    This encounter was created in error - please disregard.

## 2021-04-30 ENCOUNTER — Encounter: Payer: Medicare Other | Admitting: Internal Medicine

## 2021-04-30 DIAGNOSIS — K219 Gastro-esophageal reflux disease without esophagitis: Secondary | ICD-10-CM

## 2021-04-30 DIAGNOSIS — F419 Anxiety disorder, unspecified: Secondary | ICD-10-CM

## 2021-04-30 DIAGNOSIS — I10 Essential (primary) hypertension: Secondary | ICD-10-CM

## 2021-04-30 DIAGNOSIS — E782 Mixed hyperlipidemia: Secondary | ICD-10-CM

## 2021-04-30 DIAGNOSIS — E1142 Type 2 diabetes mellitus with diabetic polyneuropathy: Secondary | ICD-10-CM

## 2021-04-30 DIAGNOSIS — R69 Illness, unspecified: Secondary | ICD-10-CM | POA: Diagnosis not present

## 2021-04-30 DIAGNOSIS — K760 Fatty (change of) liver, not elsewhere classified: Secondary | ICD-10-CM

## 2021-04-30 NOTE — Assessment & Plan Note (Signed)
Chronic °Regular exercise and healthy diet encouraged °Check lipid panel  °Continue atorvastatin 20 mg daily °

## 2021-04-30 NOTE — Assessment & Plan Note (Signed)
Chronic Following with pain management Continue gabapentin 1200 mg twice daily Continue tizanidine 4 mg at bedtime Continue amitriptyline 75 mg at night

## 2021-04-30 NOTE — Assessment & Plan Note (Signed)
Chronic Lab Results  Component Value Date   HGBA1C 8.7 (H) 01/07/2021   Not ideally controlled Stressed diabetic diet compliance, as much exercise as possible and weight loss Management per endocrine

## 2021-04-30 NOTE — Assessment & Plan Note (Signed)
Chronic Controlled Continue Breo once daily and albuterol inhaler as needed

## 2021-04-30 NOTE — Assessment & Plan Note (Signed)
Chronic BP well controlled Continue clonidine 0.3mg  bid, losartan 50 mg twice daily, metoprolol XL 200 mg twice daily cmp

## 2021-04-30 NOTE — Assessment & Plan Note (Signed)
Chronic Generalized anxiety with history of panic attacks Overall controlled Continue amitriptyline 75 mg at bedtime, hydroxyzine 25 mg at bedtime Continue BuSpar 5 mg 3 times daily

## 2021-05-04 NOTE — Progress Notes (Signed)
Subjective:    Patient ID: Tammy Strickland, female    DOB: 31-Jan-1961, 61 y.o.   MRN: [id number]  This visit occurred during the SARS-CoV-2 public health emergency.  Safety protocols were in place, including screening questions prior to the visit, additional usage of staff PPE, and extensive cleaning of exam room while observing appropriate contact time as indicated for disinfecting solutions.     HPI The patient is here for follow up of their chronic medical problems, including anxiety, htn, DM, neuropathy, hld, elevated lfts, gerd, fatty liver with ? Early cirrhosis, COPD   She can not open her right hand all the way - she feels something pulling her hand shut.    Medications and allergies reviewed with patient and updated if appropriate.  Patient Active Problem List   Diagnosis Date Noted   Iron deficiency 01/28/2021   Chronic pain syndrome 01/28/2021   Boil of buttock 01/28/2021   Ganglion cyst 01/07/2021   Nail discoloration 01/07/2021   Right wrist pain 01/07/2021   COVID-19 virus infection 09/01/2020   Uncontrolled type 2 diabetes mellitus with hyperglycemia, with long-term current use of insulin ([place]) 09/01/2020   Nicotine dependence, cigarettes, uncomplicated 48/25/0037   Thoracic ascending aortic aneurysm 09/01/2020   Mucous retention cyst of lip 07/18/2020   Pica in adults 07/18/2020   Prominent metatarsal head of left foot 06/06/2020   Barrett's esophagus 04/14/2020   Cough 09/19/2019   Nausea & vomiting 08/15/2019   Pancreatitis 05/27/2019   Severe obesity (BMI >= 40) ([place]) 05/24/2019   TMJ arthralgia 04/26/2019   Unilateral primary osteoarthritis, right knee 01/16/2019   Anxiety 11/01/2018   RUQ pain 06/22/2018   Fatty liver 06/19/2018   Lactic acid acidosis 06/15/2018   Transaminitis 06/15/2018   Chronic bilateral low back pain without sciatica 05/17/2018   It band syndrome, left 12/29/2017   Trochanteric bursitis, left hip 12/29/2017   History of  left knee replacement 04/21/2017   Pain in left ankle and joints of left foot 04/21/2017   Pain in left hand 04/21/2017   OSA (obstructive sleep apnea) 02/23/2017   Acute gouty arthritis 12/25/2016   Stroke ([place])    Chronic fatigue 11/09/2016   Hypoxia 11/09/2016   Rash and nonspecific skin eruption 07/21/2016   Frequent headaches 07/21/2016   Allergic rhinitis 07/16/2016   Vitamin D deficiency 04/16/2016   Hyperlipidemia 01/15/2016   Diabetes with neurologic complications ([place]) 04/88/8916   Chronic diastolic congestive heart failure ([place]) 11/06/2015   TIA (transient ischemic attack)    Carotid-cavernous fistula    Insomnia 10/10/2015   Depression 10/10/2015   Benign essential HTN 08/15/2015   Fibromyalgia 08/15/2015   Peripheral neuropathy 08/15/2015   CAD (coronary artery disease) 08/15/2015   COPD (chronic obstructive pulmonary disease) ([place]) 08/15/2015   Osteoporosis 08/15/2015   Left knee DJD 08/15/2015   DDD (degenerative disc disease), lumbar 08/15/2015   Occupational exposure to industrial toxins 08/15/2015   GERD (gastroesophageal reflux disease) 08/15/2015    Current Outpatient Medications on File Prior to Visit  Medication Sig Dispense Refill   ACCU-CHEK GUIDE test strip 1 EACH BY OTHER ROUTE FOUR TIMES DAILY DAILY FOR TESTING. 100 strip 2   Accu-Chek Softclix Lancets lancets 1 each by Other route 4 (four) times daily. E11.65 100 each 0   albuterol (VENTOLIN HFA) 108 (90 Base) MCG/ACT inhaler Inhale 2 puffs into the lungs every 6 (six) hours as needed for wheezing or shortness of breath. 8 g 8   amitriptyline (ELAVIL)  75 MG tablet Take 1 tablet (75 mg total) by mouth at bedtime. 30 tablet 0   ascorbic acid (VITAMIN C) 500 MG tablet Take 1 tablet (500 mg total) by mouth daily. 7 tablet 0   aspirin EC 81 MG tablet Take 1 tablet (81 mg total) by mouth daily.     atorvastatin (LIPITOR) 20 MG tablet Take 1 tablet by mouth every day 30 tablet 11   Blood Glucose Monitoring  Suppl (ACCU-CHEK GUIDE) w/Device KIT 1 each by Does not apply route 4 (four) times daily. E11.65     busPIRone (BUSPAR) 5 MG tablet TAKE 1 TABLET(5 MG) BY MOUTH THREE TIMES DAILY 90 tablet 1   Cholecalciferol (VITAMIN D3) 50 MCG (2000 UT) capsule Take 1 capsule (2,000 Units total) by mouth daily. 90 capsule 1   cloNIDine (CATAPRES) 0.3 MG tablet Take 1 tablet (0.3 mg total) by mouth 2 (two) times daily. 180 tablet 3   cyclobenzaprine (FEXMID) 7.5 MG tablet Take 7.5 mg by mouth 3 (three) times daily.     diclofenac (CATAFLAM) 50 MG tablet Take by mouth.     fluticasone (FLONASE) 50 MCG/ACT nasal spray Shake liquid & use 1 spray into each nostril every day as needed for allergies 16 g 11   fluticasone furoate-vilanterol (BREO ELLIPTA) 100-25 MCG/INH AEPB Inhale 1 puff into the lungs daily. 1 each 11   furosemide (LASIX) 80 MG tablet Take 1 tablet by mouth every day 30 tablet 11   gabapentin (NEURONTIN) 600 MG tablet Take 2 tablets (1,200 mg total) by mouth 2 (two) times daily. 360 tablet 0   hydrOXYzine (ATARAX/VISTARIL) 25 MG tablet Take 25 mg by mouth at bedtime.     ibuprofen (ADVIL) 600 MG tablet Take 1 tablet (600 mg total) by mouth 2 (two) times daily as needed for moderate pain. 60 tablet 3   insulin glargine, 2 Unit Dial, (TOUJEO MAX SOLOSTAR) 300 UNIT/ML Solostar Pen Inject 180 Units into the skin every morning. And pen needles 1/day (Patient taking differently: Inject 160 Units into the skin every evening. And pen needles 1/day) 60 mL 3   Insulin Pen Needle (PEN NEEDLES) 32G X 5 MM MISC UAD daily for insulin 90 each 3   Iron, Ferrous Sulfate, 325 (65 Fe) MG TABS Take 325 mg by mouth daily. 90 tablet 1   losartan (COZAAR) 50 MG tablet Take 1 tablet by mouth twice daily 60 tablet 11   meloxicam (MOBIC) 15 MG tablet Take 15 mg by mouth daily.     metoprolol (TOPROL-XL) 200 MG 24 hr tablet Take 1 tablet by mouth twice daily 60 tablet 11   NARCAN 4 MG/0.1ML LIQD nasal spray kit Place 1 spray  into the nose once.     nitroGLYCERIN (NITROSTAT) 0.4 MG SL tablet Place 0.4 mg under the tongue every 5 (five) minutes as needed for chest pain.     oxyCODONE-acetaminophen (PERCOCET) 10-325 MG tablet Take 1 tablet by mouth every 6 (six) hours.     pantoprazole (PROTONIX) 40 MG tablet Take 1 tablet by mouth twice daily 60 tablet 11   potassium chloride (KLOR-CON M) 10 MEQ tablet Take 2 tablets by mouth every day 60 tablet 11   SYMBICORT 80-4.5 MCG/ACT inhaler Inhale 2 puffs into the lungs daily as needed 10.2 each 11   tiZANidine (ZANAFLEX) 4 MG tablet Take 1 tablet by mouth at bedtime 30 tablet 2   TOUJEO SOLOSTAR 300 UNIT/ML Solostar Pen Inject 140 units subcutaneously every morning (Patient taking differently:  160 Units.) 18 mL 11   ZTLIDO 1.8 % PTCH Apply 1 patch topically daily as needed (knee pain).     No current facility-administered medications on file prior to visit.    Past Medical History:  Diagnosis Date   Ambulates with cane 03/13/2021   Anxiety    Arthritis    Asthma    CHF (congestive heart failure) (HCC)    COPD (chronic obstructive pulmonary disease) ([place])    Coronary artery disease    Depression    Diabetes mellitus without complication ([place])    type 2    Fibromyalgia    Gallstones    GERD (gastroesophageal reflux disease)    HLD (hyperlipidemia)    Hypertension    Lupus (HCC)    Migraines    Myocardial infarction ([place]) 2017   Neuromuscular disorder ([place])    Osteoporosis    Pancreatitis 05/27/2019   none since   Peripheral neuropathy 03/13/2021   all over pt pt   Shingles    Sleep apnea    sleep study 03-03-2017 mild osa no cpap used per pt   Stroke ([place]) 10/2015   no residual from   Uses walker 03/13/2021   Wears glasses     Past Surgical History:  Procedure Laterality Date   ABDOMINAL HYSTERECTOMY  1989   ovaries left   APPENDECTOMY  1989   BREAST LUMPECTOMY WITH RADIOACTIVE SEED LOCALIZATION Left 06/14/2019   Procedure: LEFT BREAST  LUMPECTOMY WITH RADIOACTIVE SEED LOCALIZATION X 2;  Surgeon: Coralie Keens, MD;  Location: [facility];  Service: General;  Laterality: Left;  LMA   CHOLECYSTECTOMY  1989   IR GENERIC HISTORICAL  11/07/2015   IR ANGIO INTRA EXTRACRAN SEL COM CAROTID INNOMINATE BILAT MOD SED 11/07/2015 Luanne Bras, MD [facility]   IR GENERIC HISTORICAL  11/07/2015   IR ANGIO VERTEBRAL SEL SUBCLAVIAN INNOMINATE UNI R MOD SED 11/07/2015 Luanne Bras, MD [facility]   IR GENERIC HISTORICAL  11/07/2015   IR ANGIO VERTEBRAL SEL VERTEBRAL UNI L MOD SED 11/07/2015 Luanne Bras, MD [facility]   IR GENERIC HISTORICAL  11/07/2015   IR ANGIOGRAM EXTREMITY LEFT 11/07/2015 Luanne Bras, MD [facility]   MASS EXCISION Left 03/17/2021   Procedure: EXCISION MASS,left middle finger ulnar border mass excision,left thumb volar retinacular ganglion cyst excision;  Surgeon: Orene Desanctis, MD;  Location: [facility];  Service: Orthopedics;  Laterality: Left;   TONSILLECTOMY  2000   adenoids removed   TOTAL KNEE ARTHROPLASTY Left 03/12/2017   Procedure: LEFT TOTAL KNEE ARTHROPLASTY;  Surgeon: Mcarthur Rossetti, MD;  Location: [facility];  Service: Orthopedics;  Laterality: Left;  Adductor Block   TRIGGER FINGER RELEASE Left 03/17/2021   Procedure: left trigger thumb release;  Surgeon: Orene Desanctis, MD;  Location: [facility];  Service: Orthopedics;  Laterality: Left;  with local anesthesia    Social History   Socioeconomic History   Marital status: Single    Spouse name: n/a   Number of children: 3   Years of education: 12+   Highest education level: Not on file  Occupational History   Occupation: disabled-falling, doesn't recall name of toxin    Comment: formerly Psychologist, 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 at  [facility]   Vaping Use   Vaping Use: Never used  Substance and Sexual Activity   Alcohol use: Yes    Comment: occasional   Drug use: No   Sexual activity: Not Currently    Partners: Female    Birth control/protection: Surgical    Comment: hysterectomy  Other Topics Concern   Not on file  Social History Narrative   Moved to [place] from [place], [place] February 2017, to help her daughter.   Lives with her daughter.   Sons live in [place] and [place].   She reports that there were originally 17 children in her family (she is the youngest), the oldest are deceased, some prior to her birth, and she isn't sure which were female/female or how they died.   Social Determinants of Health   Financial Resource Strain: Low Risk    Difficulty of Paying Living Expenses: Not hard at all  Food Insecurity: No Food Insecurity   Worried About Charity fundraiser in the Last Year: Never true   [place] in the Last Year: Never true  Transportation Needs: No Transportation Needs   Lack of Transportation (Medical): No   Lack of Transportation (Non-Medical): No  Physical Activity: Inactive   Days of Exercise per Week: 0 days   Minutes of Exercise per Session: 0 min  Stress: No Stress Concern Present   Feeling of Stress : Not at all  Social Connections: Socially Isolated   Frequency of Communication with Friends and Family: More than three times a week   Frequency of Social Gatherings with Friends and Family: Once a week   Attends Religious Services: Never   Marine scientist or Organizations: No   Attends Music therapist: Never   Marital Status: Never married    Family History  Problem Relation Age of Onset   Hyperlipidemia Mother    Hypertension Mother    Stroke Mother    Thyroid disease Mother    Heart attack Mother    Hyperlipidemia Father    Hypertension Father    Stroke Father    Heart attack Father     Hypertension Sister    Stroke Sister    Thyroid disease Sister    Breast cancer Sister    Crohn's disease Sister    Hypertension Sister    Hypertension Brother    Diabetes Brother    Hypertension Brother     Review of Systems  Constitutional:  Negative for fever.  Respiratory:  Positive for shortness of breath. Negative for cough and wheezing (occ).   Cardiovascular:  Positive for palpitations (occ) and leg swelling (chronic). Negative for chest pain.  Gastrointestinal:  Negative for abdominal pain, constipation and diarrhea.  Genitourinary:  Positive for vaginal discharge.  Neurological:  Negative for light-headedness and headaches.      Objective:   Vitals:   05/05/21 0905  BP: 136/72  Pulse: 62  Temp: 98.2 F (36.8 C)  SpO2: 98%   BP Readings from Last 3 Encounters:  05/05/21 136/72  03/17/21 (!) 157/96  01/28/21 128/82   Wt Readings from Last 3 Encounters:  05/05/21 (!) 312 lb (141.5 kg)  03/17/21 (!) 310 lb 3.2 oz (140.7 kg)  01/28/21 (!) 311 lb 12.8 oz (141.4 kg)   Body mass index is 50.36 kg/m.   Physical Exam    Constitutional: Appears well-developed and well-nourished. No distress.  HENT:  Head: Normocephalic and atraumatic.  Neck: Neck supple. No tracheal deviation  present. No thyromegaly present.  No cervical lymphadenopathy Cardiovascular: Normal rate, regular rhythm and normal heart sounds.   No murmur heard. No carotid bruit .  Trace bilateral lower extremity edema Pulmonary/Chest: Effort normal and breath sounds normal. No respiratory distress. No has no wheezes. No rales.  Skin: Skin is warm and dry. Not diaphoretic.  Psychiatric: Normal mood and affect. Behavior is normal.      Assessment & Plan:    See Problem List for Assessment and Plan of chronic medical problems.

## 2021-05-05 ENCOUNTER — Encounter: Payer: Self-pay | Admitting: Internal Medicine

## 2021-05-05 ENCOUNTER — Other Ambulatory Visit: Payer: Self-pay

## 2021-05-05 ENCOUNTER — Ambulatory Visit (INDEPENDENT_AMBULATORY_CARE_PROVIDER_SITE_OTHER): Payer: Medicare Other | Admitting: Internal Medicine

## 2021-05-05 VITALS — BP 136/72 | HR 62 | Temp 98.2°F | Ht 66.0 in | Wt 312.0 lb

## 2021-05-05 DIAGNOSIS — E782 Mixed hyperlipidemia: Secondary | ICD-10-CM

## 2021-05-05 DIAGNOSIS — E611 Iron deficiency: Secondary | ICD-10-CM

## 2021-05-05 DIAGNOSIS — E041 Nontoxic single thyroid nodule: Secondary | ICD-10-CM

## 2021-05-05 DIAGNOSIS — E559 Vitamin D deficiency, unspecified: Secondary | ICD-10-CM

## 2021-05-05 DIAGNOSIS — J449 Chronic obstructive pulmonary disease, unspecified: Secondary | ICD-10-CM | POA: Diagnosis not present

## 2021-05-05 DIAGNOSIS — I10 Essential (primary) hypertension: Secondary | ICD-10-CM | POA: Diagnosis not present

## 2021-05-05 DIAGNOSIS — E1142 Type 2 diabetes mellitus with diabetic polyneuropathy: Secondary | ICD-10-CM | POA: Diagnosis not present

## 2021-05-05 DIAGNOSIS — K219 Gastro-esophageal reflux disease without esophagitis: Secondary | ICD-10-CM | POA: Diagnosis not present

## 2021-05-05 DIAGNOSIS — Z794 Long term (current) use of insulin: Secondary | ICD-10-CM | POA: Diagnosis not present

## 2021-05-05 DIAGNOSIS — G6289 Other specified polyneuropathies: Secondary | ICD-10-CM

## 2021-05-05 DIAGNOSIS — E042 Nontoxic multinodular goiter: Secondary | ICD-10-CM

## 2021-05-05 DIAGNOSIS — M720 Palmar fascial fibromatosis [Dupuytren]: Secondary | ICD-10-CM

## 2021-05-05 DIAGNOSIS — R69 Illness, unspecified: Secondary | ICD-10-CM | POA: Diagnosis not present

## 2021-05-05 DIAGNOSIS — F419 Anxiety disorder, unspecified: Secondary | ICD-10-CM

## 2021-05-05 LAB — COMPREHENSIVE METABOLIC PANEL
ALT: 21 U/L (ref 0–35)
AST: 27 U/L (ref 0–37)
Albumin: 4.1 g/dL (ref 3.5–5.2)
Alkaline Phosphatase: 116 U/L (ref 39–117)
BUN: 12 mg/dL (ref 6–23)
CO2: 31 mEq/L (ref 19–32)
Calcium: 9.8 mg/dL (ref 8.4–10.5)
Chloride: 100 mEq/L (ref 96–112)
Creatinine, Ser: 0.72 mg/dL (ref 0.40–1.20)
GFR: 91.73 mL/min (ref >60.00)
Glucose, Bld: 122 mg/dL — ABNORMAL HIGH (ref 70–99)
Potassium: 4.1 mEq/L (ref 3.5–5.1)
Sodium: 139 mEq/L (ref 135–145)
Total Bilirubin: 0.4 mg/dL (ref 0.2–1.2)
Total Protein: 7.7 g/dL (ref 6.0–8.3)

## 2021-05-05 LAB — LIPID PANEL
Cholesterol: 132 mg/dL (ref 0–200)
HDL: 46.8 mg/dL (ref >39.00)
LDL Cholesterol: 49 mg/dL (ref 0–99)
NonHDL: 85.36
Total CHOL/HDL Ratio: 3
Triglycerides: 184 mg/dL — ABNORMAL HIGH (ref 0.0–149.0)
VLDL: 36.8 mg/dL (ref 0.0–40.0)

## 2021-05-05 LAB — MICROALBUMIN / CREATININE URINE RATIO
Creatinine,U: 57.4 mg/dL
Microalb Creat Ratio: 1.2 mg/g (ref 0.0–30.0)
Microalb, Ur: <0.7 mg/dL (ref 0.0–1.9)

## 2021-05-05 LAB — CBC WITH DIFFERENTIAL/PLATELET
Basophils Absolute: 0.1 10*3/uL (ref 0.0–0.1)
Basophils Relative: 0.7 % (ref 0.0–3.0)
Eosinophils Absolute: 0.2 10*3/uL (ref 0.0–0.7)
Eosinophils Relative: 2.1 % (ref 0.0–5.0)
HCT: 41.3 % (ref 36.0–46.0)
Hemoglobin: 13.4 g/dL (ref 12.0–15.0)
Lymphocytes Relative: 41.5 % (ref 12.0–46.0)
Lymphs Abs: 4.2 10*3/uL — ABNORMAL HIGH (ref 0.7–4.0)
MCHC: 32.6 g/dL (ref 30.0–36.0)
MCV: 80.9 fl (ref 78.0–100.0)
Monocytes Absolute: 0.9 10*3/uL (ref 0.1–1.0)
Monocytes Relative: 8.8 % (ref 3.0–12.0)
Neutro Abs: 4.7 10*3/uL (ref 1.4–7.7)
Neutrophils Relative %: 46.9 % (ref 43.0–77.0)
Platelets: 255 10*3/uL (ref 150.0–400.0)
RBC: 5.1 Mil/uL (ref 3.87–5.11)
RDW: 17.7 % — ABNORMAL HIGH (ref 11.5–15.5)
WBC: 10.1 10*3/uL (ref 4.0–10.5)

## 2021-05-05 LAB — IBC PANEL
Iron: 42 ug/dL (ref 42–145)
Saturation Ratios: 9.8 % — ABNORMAL LOW (ref 20.0–50.0)
TIBC: 427 ug/dL (ref 250.0–450.0)
Transferrin: 305 mg/dL (ref 212.0–360.0)

## 2021-05-05 LAB — VITAMIN D 25 HYDROXY (VIT D DEFICIENCY, FRACTURES): VITD: 17.51 ng/mL — ABNORMAL LOW (ref 30.00–100.00)

## 2021-05-05 LAB — HEMOGLOBIN A1C: Hgb A1c MFr Bld: 8.5 % — ABNORMAL HIGH (ref 4.6–6.5)

## 2021-05-05 NOTE — Assessment & Plan Note (Signed)
Acute Her right hand is somewhat contracted and she is not able to open all the way Discussed possible referral to orthopedics-she will follow-up with [facility] as schedule with Dr. Greta Doom who she has seen in the past

## 2021-05-05 NOTE — Assessment & Plan Note (Signed)
Chronic Stressed control of her diabetes Continue gabapentin 1200 mg twice daily and amitriptyline 75 mg at bedtime

## 2021-05-05 NOTE — Assessment & Plan Note (Addendum)
Chronic Controlled Continue maintenance inhaler-Symbicort 2 puffs daily as needed or Breo 1 puff daily Continue albuterol as needed

## 2021-05-05 NOTE — Assessment & Plan Note (Signed)
Chronic Blood pressure well controlled CMP Continue clonidine 0.3 mg twice daily, Lasix 80 mg daily, losartan 50 mg daily, metoprolol XL 200 mg twice daily

## 2021-05-05 NOTE — Assessment & Plan Note (Signed)
Chronic Overall controlled History of panic attacks Continue amitriptyline 75 mg at bedtime, hydroxyzine 25 mg at bedtime and BuSpar 5 mg 3 times daily

## 2021-05-05 NOTE — Patient Instructions (Addendum)
° ° ° °  Blood work was ordered.     Medications changes include :   none   An ultrasound of your thyroid was ordered.    Please followup in 3 months

## 2021-05-05 NOTE — Assessment & Plan Note (Signed)
Chronic GERD controlled Continue pantoprazole 40 mg twice daily 

## 2021-05-05 NOTE — Assessment & Plan Note (Signed)
Chronic Taking vitamin D daily Check vitamin D level  

## 2021-05-05 NOTE — Assessment & Plan Note (Signed)
Chronic Advised her to take iron after her last blood work, but states she is not taking iron Check CBC, iron panel Will send an iron prescription if her iron counts are still low she is eating a lot of ice and likely still has iron deficiency

## 2021-05-05 NOTE — Assessment & Plan Note (Signed)
Chronic Lab Results  Component Value Date   HGBA1C 8.7 (H) 01/07/2021   Not ideally controlled Stressed diabetic diet, exercise and weight loss Check urine microalbumin Management per endocrine-due for follow-up-advised her to call to schedule

## 2021-05-05 NOTE — Assessment & Plan Note (Signed)
Chronic °Regular exercise and healthy diet encouraged °Check lipid panel  °Continue atorvastatin 20 mg daily °

## 2021-05-06 ENCOUNTER — Other Ambulatory Visit: Payer: Self-pay | Admitting: Internal Medicine

## 2021-05-06 DIAGNOSIS — M25562 Pain in left knee: Secondary | ICD-10-CM | POA: Diagnosis not present

## 2021-05-06 DIAGNOSIS — M170 Bilateral primary osteoarthritis of knee: Secondary | ICD-10-CM | POA: Diagnosis not present

## 2021-05-06 DIAGNOSIS — J0110 Acute frontal sinusitis, unspecified: Secondary | ICD-10-CM | POA: Diagnosis not present

## 2021-05-06 DIAGNOSIS — M797 Fibromyalgia: Secondary | ICD-10-CM | POA: Diagnosis not present

## 2021-05-06 DIAGNOSIS — M25579 Pain in unspecified ankle and joints of unspecified foot: Secondary | ICD-10-CM | POA: Diagnosis not present

## 2021-05-06 DIAGNOSIS — M25549 Pain in joints of unspecified hand: Secondary | ICD-10-CM | POA: Diagnosis not present

## 2021-05-06 DIAGNOSIS — M25552 Pain in left hip: Secondary | ICD-10-CM | POA: Diagnosis not present

## 2021-05-06 DIAGNOSIS — M79604 Pain in right leg: Secondary | ICD-10-CM | POA: Diagnosis not present

## 2021-05-06 DIAGNOSIS — M545 Low back pain, unspecified: Secondary | ICD-10-CM | POA: Diagnosis not present

## 2021-05-06 DIAGNOSIS — Z79891 Long term (current) use of opiate analgesic: Secondary | ICD-10-CM | POA: Diagnosis not present

## 2021-05-06 DIAGNOSIS — R69 Illness, unspecified: Secondary | ICD-10-CM | POA: Diagnosis not present

## 2021-05-06 DIAGNOSIS — G894 Chronic pain syndrome: Secondary | ICD-10-CM | POA: Diagnosis not present

## 2021-05-06 DIAGNOSIS — J3089 Other allergic rhinitis: Secondary | ICD-10-CM | POA: Diagnosis not present

## 2021-05-06 DIAGNOSIS — J0111 Acute recurrent frontal sinusitis: Secondary | ICD-10-CM | POA: Diagnosis not present

## 2021-05-14 ENCOUNTER — Institutional Professional Consult (permissible substitution): Payer: Medicare Other | Admitting: Pulmonary Disease

## 2021-05-19 ENCOUNTER — Ambulatory Visit
Admission: RE | Admit: 2021-05-19 | Discharge: 2021-05-19 | Disposition: A | Discharge status: Home / Self Care | Payer: Medicare Other | Source: Ambulatory Visit | Attending: Internal Medicine | Admitting: Internal Medicine

## 2021-05-19 DIAGNOSIS — E041 Nontoxic single thyroid nodule: Secondary | ICD-10-CM | POA: Diagnosis not present

## 2021-05-21 DIAGNOSIS — E042 Nontoxic multinodular goiter: Secondary | ICD-10-CM | POA: Insufficient documentation

## 2021-05-21 NOTE — Addendum Note (Signed)
Addended by: Binnie Rail on: 05/21/2021 05:26 AM   Modules accepted: Orders

## 2021-05-26 ENCOUNTER — Other Ambulatory Visit: Payer: Self-pay | Admitting: Internal Medicine

## 2021-05-28 ENCOUNTER — Telehealth: Payer: Self-pay

## 2021-05-28 DIAGNOSIS — M25531 Pain in right wrist: Secondary | ICD-10-CM | POA: Diagnosis not present

## 2021-05-28 DIAGNOSIS — M65341 Trigger finger, right ring finger: Secondary | ICD-10-CM | POA: Diagnosis not present

## 2021-05-28 DIAGNOSIS — M65331 Trigger finger, right middle finger: Secondary | ICD-10-CM | POA: Diagnosis not present

## 2021-05-28 NOTE — Telephone Encounter (Signed)
Pt is calling to request a refill on: Bactrim  Pt states that she has a boil and it in the wrong place. Pt complaining that its hard to walk.   Pt declines appt stating that Dr. Quay Burow will prescribe it like she has in the past.  Also what to let Dr. Quay Burow know that she hasn't heard from the ENT referral. I gave pt [facility] ENT address and number.

## 2021-05-29 MED ORDER — SULFAMETHOXAZOLE-TRIMETHOPRIM 800-160 MG PO TABS: 1.0000 | ORAL_TABLET | Freq: Two times a day (BID) | ORAL | 0 refills | Status: AC

## 2021-05-29 NOTE — Telephone Encounter (Signed)
refilled 

## 2021-06-03 DIAGNOSIS — M25552 Pain in left hip: Secondary | ICD-10-CM | POA: Diagnosis not present

## 2021-06-03 DIAGNOSIS — J3089 Other allergic rhinitis: Secondary | ICD-10-CM | POA: Diagnosis not present

## 2021-06-03 DIAGNOSIS — M545 Low back pain, unspecified: Secondary | ICD-10-CM | POA: Diagnosis not present

## 2021-06-03 DIAGNOSIS — J0111 Acute recurrent frontal sinusitis: Secondary | ICD-10-CM | POA: Diagnosis not present

## 2021-06-03 DIAGNOSIS — Z79891 Long term (current) use of opiate analgesic: Secondary | ICD-10-CM | POA: Diagnosis not present

## 2021-06-03 DIAGNOSIS — M25579 Pain in unspecified ankle and joints of unspecified foot: Secondary | ICD-10-CM | POA: Diagnosis not present

## 2021-06-03 DIAGNOSIS — J45991 Cough variant asthma: Secondary | ICD-10-CM | POA: Diagnosis not present

## 2021-06-03 DIAGNOSIS — M170 Bilateral primary osteoarthritis of knee: Secondary | ICD-10-CM | POA: Diagnosis not present

## 2021-06-03 DIAGNOSIS — M797 Fibromyalgia: Secondary | ICD-10-CM | POA: Diagnosis not present

## 2021-06-03 DIAGNOSIS — M79604 Pain in right leg: Secondary | ICD-10-CM | POA: Diagnosis not present

## 2021-06-03 DIAGNOSIS — G894 Chronic pain syndrome: Secondary | ICD-10-CM | POA: Diagnosis not present

## 2021-06-03 DIAGNOSIS — M25562 Pain in left knee: Secondary | ICD-10-CM | POA: Diagnosis not present

## 2021-06-12 ENCOUNTER — Other Ambulatory Visit: Payer: Self-pay | Admitting: Otolaryngology

## 2021-06-12 DIAGNOSIS — E041 Nontoxic single thyroid nodule: Secondary | ICD-10-CM

## 2021-06-13 ENCOUNTER — Ambulatory Visit (INDEPENDENT_AMBULATORY_CARE_PROVIDER_SITE_OTHER): Payer: 59

## 2021-06-13 DIAGNOSIS — Z0000 Encounter for general adult medical examination without abnormal findings: Secondary | ICD-10-CM

## 2021-06-13 NOTE — Progress Notes (Signed)
I connected with Tammy Strickland today by telephone and verified that I am speaking with the correct person using two identifiers. Location patient: home Location provider: work Persons participating in the virtual visit: patient, provider.   I discussed the limitations, risks, security and privacy concerns of performing an evaluation and management service by telephone and the availability of in person appointments. I also discussed with the patient that there may be a patient responsible charge related to this service. The patient expressed understanding and verbally consented to this telephonic visit.    Interactive audio and video telecommunications were attempted between this provider and patient, however failed, due to patient having technical difficulties OR patient did not have access to video capability.  We continued and completed visit with audio only.  Some vital signs may be absent or patient reported.   Time Spent with patient on telephone encounter: 40 minutes  Subjective:   Tammy Strickland is a 61 y.o. female who presents for Medicare Annual (Subsequent) preventive examination.  Review of Systems     Cardiac Risk Factors include: diabetes mellitus;dyslipidemia;family history of premature cardiovascular disease;hypertension;sedentary lifestyle;obesity (BMI >30kg/m2);smoking/ tobacco exposure;Other (see comment) (history of CVA, TIA)     Objective:    There were no vitals filed for this visit. There is no height or weight on file to calculate BMI.  Advanced Directives 06/13/2021 03/17/2021 11/02/2020 10/09/2020 09/01/2020 08/31/2020 08/31/2020  Does Patient Have a Medical Advance Directive? No No No No - No No  Would patient like information on creating a medical advance directive? No - Patient declined No - Patient declined - - No - Patient declined - -    Current Medications (verified) Outpatient Encounter Medications as of 06/13/2021  Medication Sig   ACCU-CHEK GUIDE test  strip 1 EACH BY OTHER ROUTE FOUR TIMES DAILY DAILY FOR TESTING.   Accu-Chek Softclix Lancets lancets 1 each by Other route 4 (four) times daily. E11.65   albuterol (VENTOLIN HFA) 108 (90 Base) MCG/ACT inhaler Inhale 2 puffs into the lungs every 6 (six) hours as needed for wheezing or shortness of breath.   amitriptyline (ELAVIL) 75 MG tablet TAKE 1 TABLET BY MOUTH AT BEDTIME   ascorbic acid (VITAMIN C) 500 MG tablet Take 1 tablet (500 mg total) by mouth daily.   aspirin EC 81 MG tablet Take 1 tablet (81 mg total) by mouth daily.   atorvastatin (LIPITOR) 20 MG tablet Take 1 tablet by mouth every day   Blood Glucose Monitoring Suppl (ACCU-CHEK GUIDE) w/Device KIT 1 each by Does not apply route 4 (four) times daily. E11.65   busPIRone (BUSPAR) 5 MG tablet TAKE 1 TABLET(5 MG) BY MOUTH THREE TIMES DAILY   Cholecalciferol (VITAMIN D3) 50 MCG (2000 UT) capsule Take 1 capsule (2,000 Units total) by mouth daily.   cloNIDine (CATAPRES) 0.3 MG tablet Take 1 tablet (0.3 mg total) by mouth 2 (two) times daily.   cyclobenzaprine (FEXMID) 7.5 MG tablet Take 7.5 mg by mouth 3 (three) times daily.   diclofenac (CATAFLAM) 50 MG tablet Take by mouth.   fluticasone (FLONASE) 50 MCG/ACT nasal spray Shake liquid & use 1 spray into each nostril every day as needed for allergies   fluticasone furoate-vilanterol (BREO ELLIPTA) 100-25 MCG/INH AEPB Inhale 1 puff into the lungs daily.   furosemide (LASIX) 80 MG tablet Take 1 tablet by mouth every day   gabapentin (NEURONTIN) 600 MG tablet Take 2 tablets (1,200 mg total) by mouth 2 (two) times daily.   hydrOXYzine (ATARAX/VISTARIL) 25 MG  tablet Take 25 mg by mouth at bedtime.   ibuprofen (ADVIL) 600 MG tablet Take 1 tablet (600 mg total) by mouth 2 (two) times daily as needed for moderate pain.   insulin glargine, 2 Unit Dial, (TOUJEO MAX SOLOSTAR) 300 UNIT/ML Solostar Pen Inject 180 Units into the skin every morning. And pen needles 1/day (Patient taking differently: Inject  160 Units into the skin every evening. And pen needles 1/day)   Insulin Pen Needle (PEN NEEDLES) 32G X 5 MM MISC UAD daily for insulin   Iron, Ferrous Sulfate, 325 (65 Fe) MG TABS Take 325 mg by mouth daily.   losartan (COZAAR) 50 MG tablet Take 1 tablet by mouth twice daily   metoprolol (TOPROL-XL) 200 MG 24 hr tablet Take 1 tablet by mouth twice daily   NARCAN 4 MG/0.1ML LIQD nasal spray kit Place 1 spray into the nose once.   nitroGLYCERIN (NITROSTAT) 0.4 MG SL tablet Place 0.4 mg under the tongue every 5 (five) minutes as needed for chest pain.   oxyCODONE-acetaminophen (PERCOCET) 10-325 MG tablet Take 1 tablet by mouth every 6 (six) hours.   pantoprazole (PROTONIX) 40 MG tablet Take 1 tablet by mouth twice daily   potassium chloride (KLOR-CON M) 10 MEQ tablet Take 2 tablets by mouth every day   SYMBICORT 80-4.5 MCG/ACT inhaler Inhale 2 puffs into the lungs daily as needed   tiZANidine (ZANAFLEX) 4 MG tablet Take 1 tablet by mouth at bedtime   TOUJEO SOLOSTAR 300 UNIT/ML Solostar Pen Inject 140 units subcutaneously every morning (Patient taking differently: 160 Units.)   ZTLIDO 1.8 % PTCH Apply 1 patch topically daily as needed (knee pain).   No facility-administered encounter medications on file as of 06/13/2021.    Allergies (verified) Metformin and related, Other, Ozempic (0.25 or 0.5 mg-dose) [semaglutide(0.25 or 0.63m-dos)], and Bacitracin   History: Past Medical History:  Diagnosis Date   Ambulates with cane 03/13/2021   Anxiety    Arthritis    Asthma    CHF (congestive heart failure) (HCC)    COPD (chronic obstructive pulmonary disease) ([facility]    Coronary artery disease    Depression    Diabetes mellitus without complication ([facility]    type 2    Fibromyalgia    Gallstones    GERD (gastroesophageal reflux disease)    HLD (hyperlipidemia)    Hypertension    Lupus (HCC)    Migraines    Myocardial infarction ([facility] 2017   Neuromuscular disorder ([facility]    Osteoporosis     Pancreatitis 05/27/2019   none since   Peripheral neuropathy 03/13/2021   all over pt pt   Shingles    Sleep apnea    sleep study 03-03-2017 mild osa no cpap used per pt   Stroke ([facility] 10/2015   no residual from   Uses walker 03/13/2021   Wears glasses    Past Surgical History:  Procedure Laterality Date   ABDOMINAL HYSTERECTOMY  1989   ovaries left   APPENDECTOMY  1989   BREAST LUMPECTOMY WITH RADIOACTIVE SEED LOCALIZATION Left 06/14/2019   Procedure: LEFT BREAST LUMPECTOMY WITH RADIOACTIVE SEED LOCALIZATION X 2;  Surgeon: BCoralie Keens MD;  [facility]  Service: General;  Laterality: Left;  LMA   CHOLECYSTECTOMY  1989   IR GENERIC HISTORICAL  11/07/2015   IR ANGIO INTRA EXTRACRAN SEL COM CAROTID INNOMINATE BILAT MOD SED 11/07/2015 SLuanne Bras MD [facility]   IR GENERIC HISTORICAL  11/07/2015   IR ANGIO VERTEBRAL SEL SUBCLAVIAN INNOMINATE  UNI R MOD SED 11/07/2015 Luanne Bras, MD [facility]   IR GENERIC HISTORICAL  11/07/2015   IR ANGIO VERTEBRAL SEL VERTEBRAL UNI L MOD SED 11/07/2015 Luanne Bras, MD [facility]   IR GENERIC HISTORICAL  11/07/2015   IR ANGIOGRAM EXTREMITY LEFT 11/07/2015 Luanne Bras, MD [facility]   MASS EXCISION Left 03/17/2021   Procedure: EXCISION MASS,left middle finger ulnar border mass excision,left thumb volar retinacular ganglion cyst excision;  Surgeon: Orene Desanctis, MD;  Location: [facility];  Service: Orthopedics;  Laterality: Left;   TONSILLECTOMY  2000   adenoids removed   TOTAL KNEE ARTHROPLASTY Left 03/12/2017   Procedure: LEFT TOTAL KNEE ARTHROPLASTY;  Surgeon: Mcarthur Rossetti, MD;  Location: [facility];  Service: Orthopedics;  Laterality: Left;  Adductor Block   TRIGGER FINGER RELEASE Left 03/17/2021   Procedure: left trigger thumb release;  Surgeon: Orene Desanctis, MD;  Location[facility];  Service: Orthopedics;  Laterality: Left;  with local anesthesia   Family  History  Problem Relation Age of Onset   Hyperlipidemia Mother    Hypertension Mother    Stroke Mother    Thyroid disease Mother    Heart attack Mother    Hyperlipidemia Father    Hypertension Father    Stroke Father    Heart attack Father    Hypertension Sister    Stroke Sister    Thyroid disease Sister    Breast cancer Sister    Crohn's disease Sister    Hypertension Sister    Hypertension Brother    Diabetes Brother    Hypertension Brother    Social History   Socioeconomic History   Marital status: Single    Spouse name: n/a   Number of children: 3   Years of education: 12+   Highest education level: Not on file  Occupational History   Occupation: disabled-falling, doesn't recall name of toxin    Comment: formerly Psychologist, 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  Tobacco Use   Smoking status: Every Day    Packs/day: 0.50    Years: 35.00    Pack years: 17.50    Types: Cigarettes   Smokeless tobacco: Never   Tobacco comments:    referred  to smoking  cessation  classes at  [employer] Use   Vaping Use: Never used  Substance and Sexual Activity   Alcohol use: Yes    Comment: occasional   Drug use: No   Sexual activity: Not Currently    Partners: Female    Birth control/protection: Surgical    Comment: hysterectomy  Other Topics Concern   Not on file  Social History Narrative   Moved to [place] from [place], [place] February 2017, to help her daughter.   Lives with her daughter.   Sons live in [place] and [place].   She reports that there were originally 17 children in her family (she is the youngest), the oldest are deceased, some prior to her birth, and she isn't sure which were female/female or how they died.   Social Determinants of Health   Financial Resource Strain: Low Risk    Difficulty of Paying Living Expenses: Not hard at all  Food Insecurity: No Food Insecurity   Worried About Charity fundraiser in the Last Year: Never true   [place] in the  Last Year: Never true  Transportation Needs: No Transportation Needs   Lack of Transportation (Medical): No   Lack of Transportation (Non-Medical): No  Physical Activity:  Inactive   Days of Exercise per Week: 0 days   Minutes of Exercise per Session: 0 min  Stress: No Stress Concern Present   Feeling of Stress : Not at all  Social Connections: Socially Isolated   Frequency of Communication with Friends and Family: More than three times a week   Frequency of Social Gatherings with Friends and Family: Once a week   Attends Religious Services: Never   Marine scientist or Organizations: No   Attends Music therapist: Never   Marital Status: Never married    Tobacco Counseling Ready to quit: Not Answered Counseling given: Not Answered Tobacco comments: referred  to smoking  cessation  classes at  [employer]    Clinical Intake:  Pre-visit preparation completed: Yes  Pain : No/denies pain     Nutritional Risks: None Diabetes: Yes CBG done?: No Did pt. bring in CBG monitor from home?: No  How often do you need to have someone help you when you read instructions, pamphlets, or other written materials from your doctor or pharmacy?: 1 - Never What is the last grade level you completed in school?: 11th grade  Diabetic? yes  Interpreter Needed?: No  Information entered by :: Lisette Abu, LPN   Activities of Daily Living In your present state of health, do you have any difficulty performing the following activities: 06/13/2021 03/17/2021  Hearing? N N  Vision? N N  Difficulty concentrating or making decisions? N N  Walking or climbing stairs? Y -  Dressing or bathing? N N  Doing errands, shopping? N -  Preparing Food and eating ? N -  Using the Toilet? N -  In the past six months, have you accidently leaked urine? Y -  Do you have problems with loss of bowel control? N -  Managing your Medications? N -  Managing your Finances? N -  Housekeeping or  managing your Housekeeping? N -  Some recent data might be hidden    Patient Care Team: Binnie Rail, MD as PCP - General (Internal Medicine) Greer Pickerel, MD as Consulting Physician (General Surgery) Juluis Rainier as Consulting Physician (Optometry)  Indicate any recent Medical Services you may have received from other than [facility] providers in the past year (date may be approximate).     Assessment:   This is a routine wellness examination for Tammy Strickland.  Hearing/Vision screen Hearing Screening - Comments:: Patient denied any hearing difficulty.   No hearing aids.  Vision Screening - Comments:: Patient wears corrective glasses/contacts.  Eye exam done annually by: Alois Cliche, OD.  Dietary issues and exercise activities discussed: Current Exercise Habits: The patient does not participate in regular exercise at present, Exercise limited by: orthopedic condition(s);respiratory conditions(s)   Goals Addressed             This Visit's Progress    Quit Smoking        Depression Screen PHQ 2/9 Scores 06/13/2021 05/31/2020 05/24/2019 10/30/2016 01/03/2016 11/26/2015 08/15/2015  PHQ - 2 Score 0 0 0 2 1 0 0  PHQ- 9 Score - - _0 - -    Fall Risk Fall Risk  06/13/2021 05/31/2020 12/24/2016 10/30/2016 11/26/2015  Falls in the past year? 0 1 No Yes Yes  Number falls in past yr: 0 1 - 2 or more 1  Injury with Fall? 0 1 - No -  Risk for fall due to : No Fall Risks Impaired balance/gait;Orthopedic patient;Medication side effect - Impaired vision;Impaired mobility;Impaired  balance/gait -  Follow up Falls evaluation completed - - - -    FALL RISK PREVENTION PERTAINING TO THE HOME:  Any stairs in or around the home? No  If so, are there any without handrails? No  Home free of loose throw rugs in walkways, pet beds, electrical cords, etc? Yes  Adequate lighting in your home to reduce risk of falls? Yes   ASSISTIVE DEVICES UTILIZED TO PREVENT FALLS:  Life alert? No  Use of a cane, walker or  w/c? No  Grab bars in the bathroom? Yes  Shower chair or bench in shower? No  Elevated toilet seat or a handicapped toilet? No   TIMED UP AND GO:  Was the test performed? No .  Length of time to ambulate 10 feet: n/a sec.   Gait steady and fast without use of assistive device  Cognitive Function: Normal cognitive status assessed by direct observation by this Nurse Health Advisor. No abnormalities found.          Immunizations Immunization History  Administered Date(s) Administered   Influenza,inj,Quad PF,6+ Mos 01/15/2016, 12/25/2016, 01/24/2018, 01/07/2021   Pneumococcal Polysaccharide-23 04/16/2016   Tdap 01/11/2018    TDAP status: Up to date  Flu Vaccine status: Up to date  Pneumococcal vaccine status: Up to date  Covid-19 vaccine status: Declined, Education has been provided regarding the importance of this vaccine but patient still declined. Advised may receive this vaccine at local pharmacy or Health Dept.or vaccine clinic. Aware to provide a copy of the vaccination record if obtained from local pharmacy or Health Dept. Verbalized acceptance and understanding.  Qualifies for Shingles Vaccine? Yes   Zostavax completed No   Shingrix Completed?: No.    Education has been provided regarding the importance of this vaccine. Patient has been advised to call insurance company to determine out of pocket expense if they have not yet received this vaccine. Advised may also receive vaccine at local pharmacy or Health Dept. Verbalized acceptance and understanding.  Screening Tests Health Maintenance  Topic Date Due   COVID-19 Vaccine (1) Never done   Zoster Vaccines- Shingrix (1 of 2) Never done   OPHTHALMOLOGY EXAM  05/27/2019   MAMMOGRAM  05/01/2021   FOOT EXAM  09/13/2021   HEMOGLOBIN A1C  11/02/2021   COLONOSCOPY (Pts 45-49yr Insurance coverage will need to be confirmed)  02/23/2024   TETANUS/TDAP  01/12/2028   INFLUENZA VACCINE  Completed   Hepatitis C Screening   Completed   HIV Screening  Completed   HPV VACCINES  Aged Out    Health Maintenance  Health Maintenance Due  Topic Date Due   COVID-19 Vaccine (1) Never done   Zoster Vaccines- Shingrix (1 of 2) Never done   OPHTHALMOLOGY EXAM  05/27/2019   MAMMOGRAM  05/01/2021    Colorectal cancer screening: Type of screening: Colonoscopy. Completed 02/22/2014. Repeat every 10 years  Mammogram status: Completed 05/18/2019. Repeat every year  Bone Density Status: never done  Lung Cancer Screening: (Low Dose CT Chest recommended if Age 41100-80years, 30 pack-year currently smoking OR have quit w/in 15years.) does not qualify.   Lung Cancer Screening Referral: no  Additional Screening:  Hepatitis C Screening: does qualify; Completed yes  Vision Screening: Recommended annual ophthalmology exams for early detection of glaucoma and other disorders of the eye. Is the patient up to date with their annual eye exam?  No  Who is the provider or what is the name of the office in which the patient attends annual eye  exams? Alois Cliche, OD. If pt is not established with a provider, would they like to be referred to a provider to establish care? No .   Dental Screening: Recommended annual dental exams for proper oral hygiene  Community Resource Referral / Chronic Care Management: CRR required this visit?  No   CCM required this visit?  No      Plan:     I have personally reviewed and noted the following in the patients chart:   Medical and social history Use of alcohol, tobacco or illicit drugs  Current medications and supplements including opioid prescriptions.  Functional ability and status Nutritional status Physical activity Advanced directives List of other physicians Hospitalizations, surgeries, and ER visits in previous 12 months Vitals Screenings to include cognitive, depression, and falls Referrals and appointments  In addition, I have reviewed and discussed with patient certain  preventive protocols, quality metrics, and best practice recommendations. A written personalized care plan for preventive services as well as general preventive health recommendations were provided to patient.     Sheral Flow, LPN   08/14/9670   Nurse Notes:  Patient is cogitatively intact. There were no vitals filed for this visit. There is no height or weight on file to calculate BMI. Medications reviewed with patient; yes opioid use noted.

## 2021-06-13 NOTE — Patient Instructions (Signed)
Tammy Strickland , Thank you for taking time to come for your Medicare Wellness Visit. I appreciate your ongoing commitment to your health goals. Please review the following plan we discussed and let me know if I can assist you in the future.   Screening recommendations/referrals: Colonoscopy: 02/22/2014; due every 10 years Mammogram: 05/18/2019; due every 1-2 years Bone Density: never done Recommended yearly ophthalmology/optometry visit for glaucoma screening and checkup Recommended yearly dental visit for hygiene and checkup  Vaccinations: Influenza vaccine: 01/07/2021; due every Fall season Pneumococcal vaccine: 04/16/2016 Tdap vaccine: 01/11/2018; due every 10 years Shingles vaccine: declined  Covid-19: declined  Advanced directives: Advance directive discussed with you today. Even though you declined this today please call our office should you change your mind and we can give you the proper paperwork for you to fill out.  Conditions/risks identified: Yes; my goal is to quit smoking.  Next appointment: 06/15/2022 at 8:40 a.m. telephone visit with Mignon Pine, Nurse Health Advisor.  If need to reschedule or cancel please call [phone].  Preventive Care 40-64 Years, Female Preventive care refers to lifestyle choices and visits with your health care provider that can promote health and wellness. What does preventive care include? A yearly physical exam. This is also called an annual well check. Dental exams once or twice a year. Routine eye exams. Ask your health care provider how often you should have your eyes checked. Personal lifestyle choices, including: Daily care of your teeth and gums. Regular physical activity. Eating a healthy diet. Avoiding tobacco and drug use. Limiting alcohol use. Practicing safe sex. Taking low-dose aspirin daily starting at age 32. Taking vitamin and mineral supplements as recommended by your health care provider. What happens during an annual well  check? The services and screenings done by your health care provider during your annual well check will depend on your age, overall health, lifestyle risk factors, and family history of disease. Counseling  Your health care provider may ask you questions about your: Alcohol use. Tobacco use. Drug use. Emotional well-being. Home and relationship well-being. Sexual activity. Eating habits. Work and work Statistician. Method of birth control. Menstrual cycle. Pregnancy history. Screening  You may have the following tests or measurements: Height, weight, and BMI. Blood pressure. Lipid and cholesterol levels. These may be checked every 5 years, or more frequently if you are over 60 years old. Skin check. Lung cancer screening. You may have this screening every year starting at age 40 if you have a 30-pack-year history of smoking and currently smoke or have quit within the past 15 years. Fecal occult blood test (FOBT) of the stool. You may have this test every year starting at age 34. Flexible sigmoidoscopy or colonoscopy. You may have a sigmoidoscopy every 5 years or a colonoscopy every 10 years starting at age 30. Hepatitis C blood test. Hepatitis B blood test. Sexually transmitted disease (STD) testing. Diabetes screening. This is done by checking your blood sugar (glucose) after you have not eaten for a while (fasting). You may have this done every 1-3 years. Mammogram. This may be done every 1-2 years. Talk to your health care provider about when you should start having regular mammograms. This may depend on whether you have a family history of breast cancer. BRCA-related cancer screening. This may be done if you have a family history of breast, ovarian, tubal, or peritoneal cancers. Pelvic exam and Pap test. This may be done every 3 years starting at age 83. Starting at age 52, this may be  done every 5 years if you have a Pap test in combination with an HPV test. Bone density scan. This  is done to screen for osteoporosis. You may have this scan if you are at high risk for osteoporosis. Discuss your test results, treatment options, and if necessary, the need for more tests with your health care provider. Vaccines  Your health care provider may recommend certain vaccines, such as: Influenza vaccine. This is recommended every year. Tetanus, diphtheria, and acellular pertussis (Tdap, Td) vaccine. You may need a Td booster every 10 years. Zoster vaccine. You may need this after age 75. Pneumococcal 13-valent conjugate (PCV13) vaccine. You may need this if you have certain conditions and were not previously vaccinated. Pneumococcal polysaccharide (PPSV23) vaccine. You may need one or two doses if you smoke cigarettes or if you have certain conditions. Talk to your health care provider about which screenings and vaccines you need and how often you need them. This information is not intended to replace advice given to you by your health care provider. Make sure you discuss any questions you have with your health care provider. Document Released: 04/26/2015 Document Revised: 12/18/2015 Document Reviewed: 01/29/2015 Elsevier Interactive Patient Education  2017 [employer] Prevention in the Home Falls can cause injuries. They can happen to people of all ages. There are many things you can do to make your home safe and to help prevent falls. What can I do on the outside of my home? Regularly fix the edges of walkways and driveways and fix any cracks. Remove anything that might make you trip as you walk through a door, such as a raised step or threshold. Trim any bushes or trees on the path to your home. Use bright outdoor lighting. Clear any walking paths of anything that might make someone trip, such as rocks or tools. Regularly check to see if handrails are loose or broken. Make sure that both sides of any steps have handrails. Any raised decks and porches should have  guardrails on the edges. Have any leaves, snow, or ice cleared regularly. Use sand or salt on walking paths during winter. Clean up any spills in your garage right away. This includes oil or grease spills. What can I do in the bathroom? Use night lights. Install grab bars by the toilet and in the tub and shower. Do not use towel bars as grab bars. Use non-skid mats or decals in the tub or shower. If you need to sit down in the shower, use a plastic, non-slip stool. Keep the floor dry. Clean up any water that spills on the floor as soon as it happens. Remove soap buildup in the tub or shower regularly. Attach bath mats securely with double-sided non-slip rug tape. Do not have throw rugs and other things on the floor that can make you trip. What can I do in the bedroom? Use night lights. Make sure that you have a light by your bed that is easy to reach. Do not use any sheets or blankets that are too big for your bed. They should not hang down onto the floor. Have a firm chair that has side arms. You can use this for support while you get dressed. Do not have throw rugs and other things on the floor that can make you trip. What can I do in the kitchen? Clean up any spills right away. Avoid walking on wet floors. Keep items that you use a lot in easy-to-reach places. If you  need to reach something above you, use a strong step stool that has a grab bar. Keep electrical cords out of the way. Do not use floor polish or wax that makes floors slippery. If you must use wax, use non-skid floor wax. Do not have throw rugs and other things on the floor that can make you trip. What can I do with my stairs? Do not leave any items on the stairs. Make sure that there are handrails on both sides of the stairs and use them. Fix handrails that are broken or loose. Make sure that handrails are as long as the stairways. Check any carpeting to make sure that it is firmly attached to the stairs. Fix any carpet  that is loose or worn. Avoid having throw rugs at the top or bottom of the stairs. If you do have throw rugs, attach them to the floor with carpet tape. Make sure that you have a light switch at the top of the stairs and the bottom of the stairs. If you do not have them, ask someone to add them for you. What else can I do to help prevent falls? Wear shoes that: Do not have high heels. Have rubber bottoms. Are comfortable and fit you well. Are closed at the toe. Do not wear sandals. If you use a stepladder: Make sure that it is fully opened. Do not climb a closed stepladder. Make sure that both sides of the stepladder are locked into place. Ask someone to hold it for you, if possible. Clearly mark and make sure that you can see: Any grab bars or handrails. First and last steps. Where the edge of each step is. Use tools that help you move around (mobility aids) if they are needed. These include: Canes. Walkers. Scooters. Crutches. Turn on the lights when you go into a dark area. Replace any light bulbs as soon as they burn out. Set up your furniture so you have a clear path. Avoid moving your furniture around. If any of your floors are uneven, fix them. If there are any pets around you, be aware of where they are. Review your medicines with your doctor. Some medicines can make you feel dizzy. This can increase your chance of falling. Ask your doctor what other things that you can do to help prevent falls. This information is not intended to replace advice given to you by your health care provider. Make sure you discuss any questions you have with your health care provider. Document Released: 01/24/2009 Document Revised: 09/05/2015 Document Reviewed: 05/04/2014 Elsevier Interactive Patient Education  2017 [employer].

## 2021-06-18 ENCOUNTER — Ambulatory Visit
Admission: RE | Admit: 2021-06-18 | Discharge: 2021-06-18 | Disposition: A | Discharge status: Home / Self Care | Payer: 59 | Source: Ambulatory Visit | Attending: Otolaryngology | Admitting: Otolaryngology

## 2021-06-18 ENCOUNTER — Other Ambulatory Visit: Payer: Self-pay

## 2021-06-18 ENCOUNTER — Other Ambulatory Visit (HOSPITAL_COMMUNITY)
Admission: RE | Admit: 2021-06-18 | Discharge: 2021-06-18 | Disposition: A | Discharge status: Home / Self Care | Payer: 59 | Source: Ambulatory Visit | Attending: Otolaryngology | Admitting: Otolaryngology

## 2021-06-18 DIAGNOSIS — D440 Neoplasm of uncertain behavior of thyroid gland: Secondary | ICD-10-CM | POA: Diagnosis not present

## 2021-06-18 DIAGNOSIS — E041 Nontoxic single thyroid nodule: Secondary | ICD-10-CM | POA: Diagnosis present

## 2021-06-20 LAB — CYTOLOGY - NON PAP

## 2021-06-21 IMAGING — CT CT ABD-PELV W/ CM
2 of 5 series · 16 of 46 positions shown, 18 images · IV contrast (omnipaque)
Comparison: CT 06/15/2018

CLINICAL DATA: Abdominal distension, nausea vomiting and upper
abdominal pain

EXAM:
CT ABDOMEN AND PELVIS WITH CONTRAST
TECHNIQUE: Multidetector CT imaging of the abdomen and pelvis was performed
using the standard protocol following bolus administration of
intravenous contrast.
CONTRAST:  100mL OMNIPAQUE IOHEXOL 300 MG/ML  SOLN

[Series 2: axial st · axial · 0.86mm/px · z∈[-456,-41]mm · 13 of 97 slices shown, 15 images]
[im 7/97  soft-tissue]
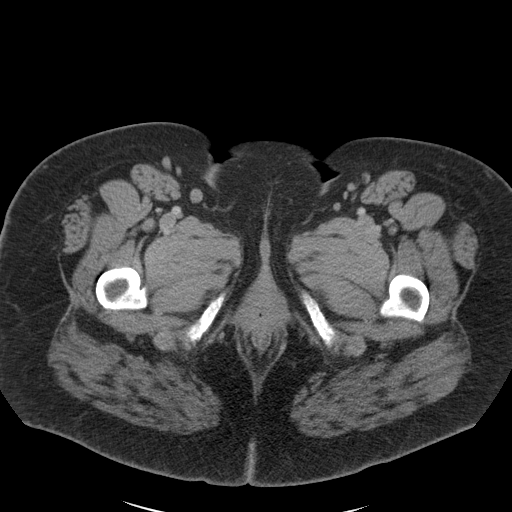
[im 7/97  bone]
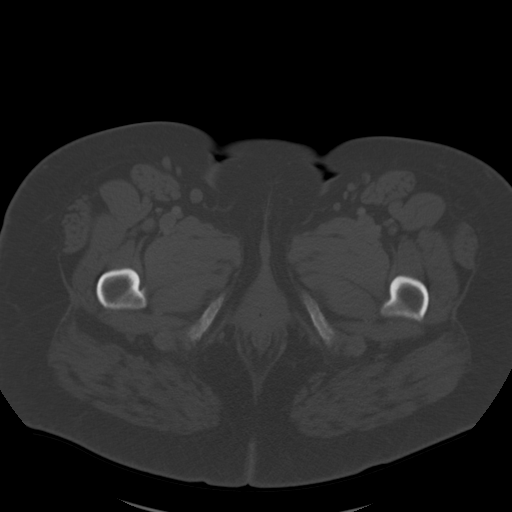
[im 14/97  soft-tissue]
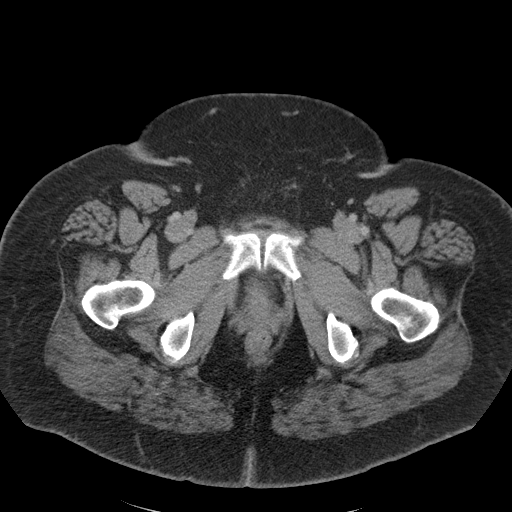
[im 21/97  soft-tissue]
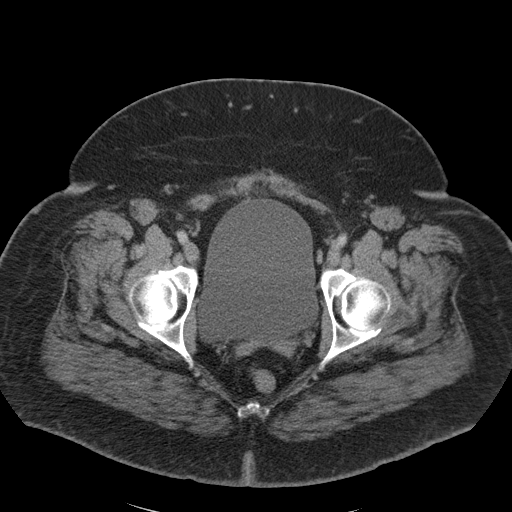
[im 28/97  soft-tissue]
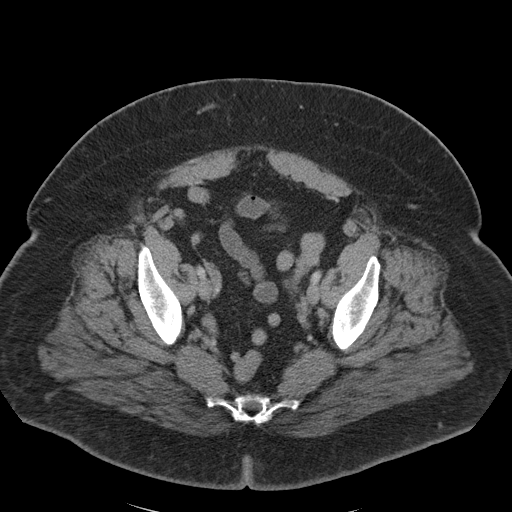
[im 35/97  soft-tissue]
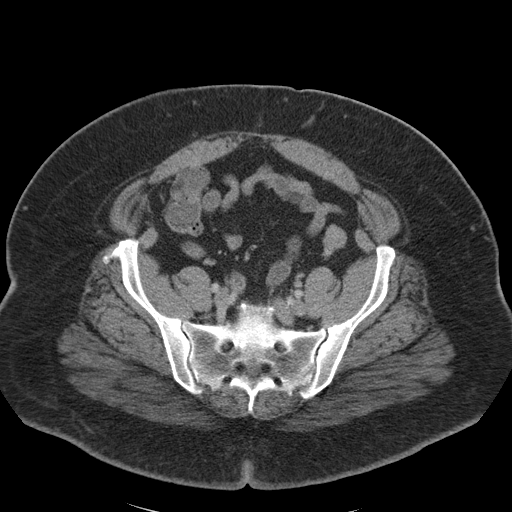
[im 42/97  soft-tissue]
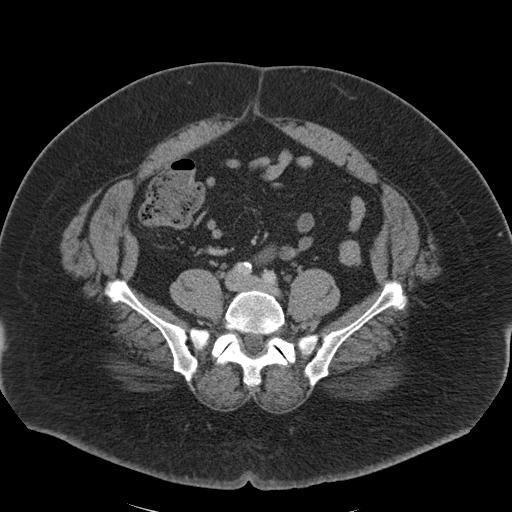
[im 49/97  soft-tissue]
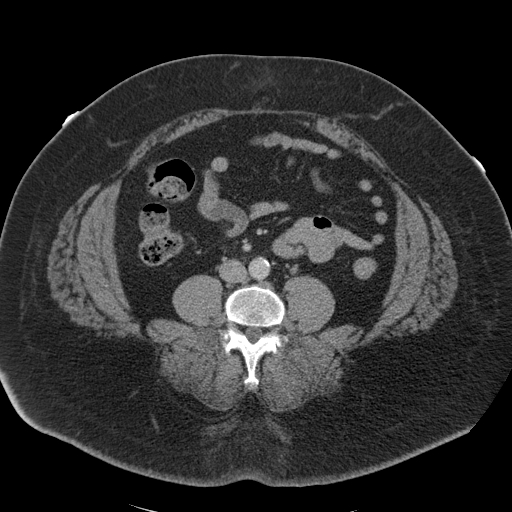
[im 55/97  soft-tissue]
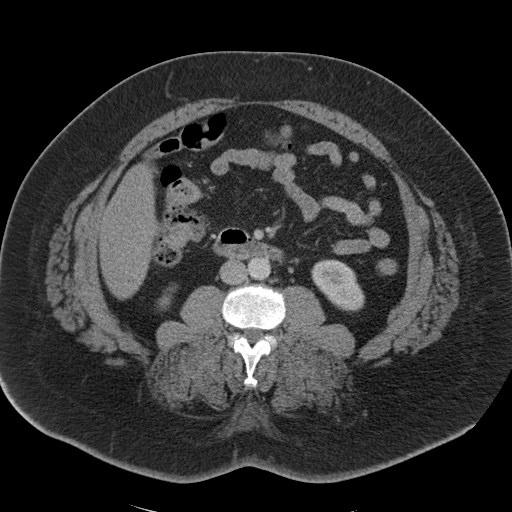
[im 62/97  soft-tissue]
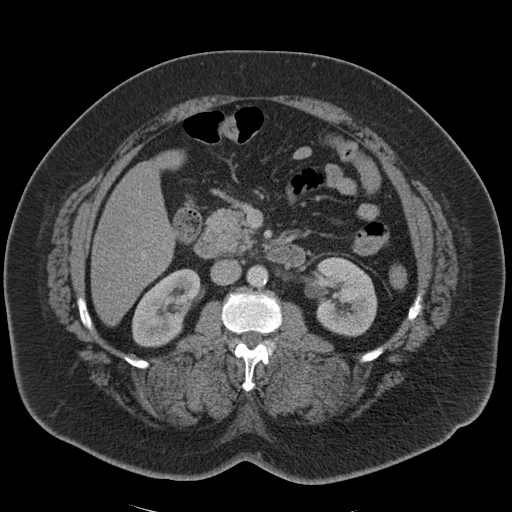
[im 62/97  bone]
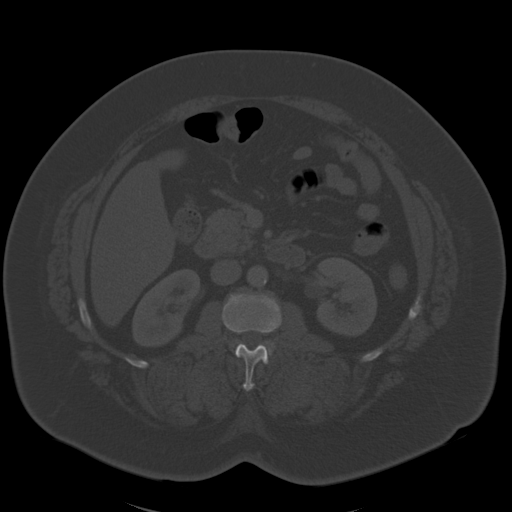
[im 69/97  soft-tissue]
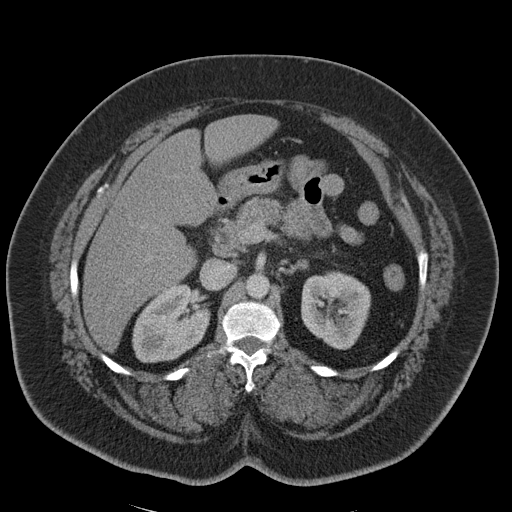
[im 76/97  soft-tissue]
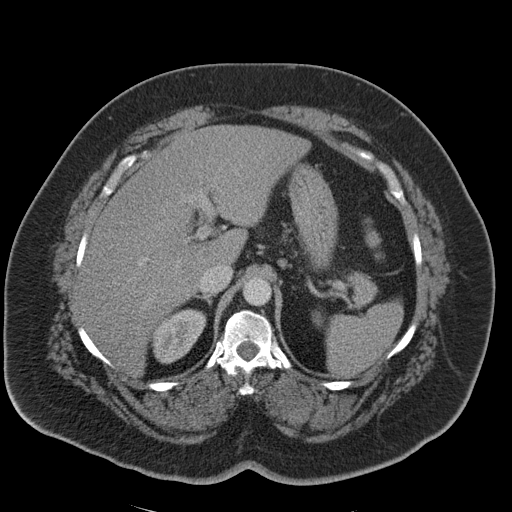
[im 83/97  soft-tissue]
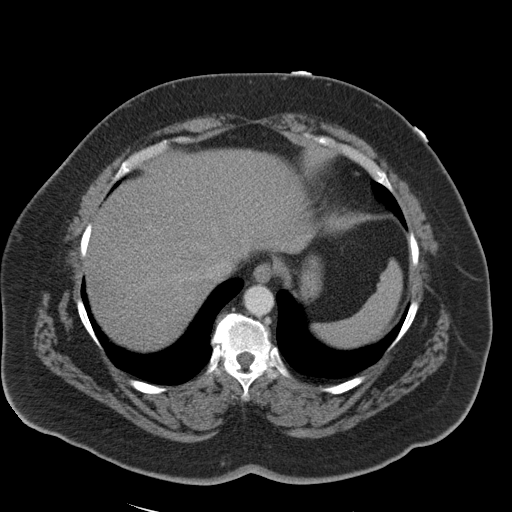
[im 90/97  soft-tissue]
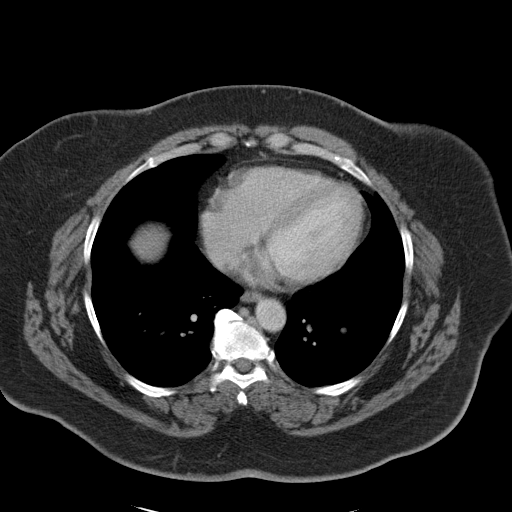

[Series 4: coronal st · coronal · 1.01mm/px · 3 of 179 slices shown]
[im 60/179  soft-tissue]
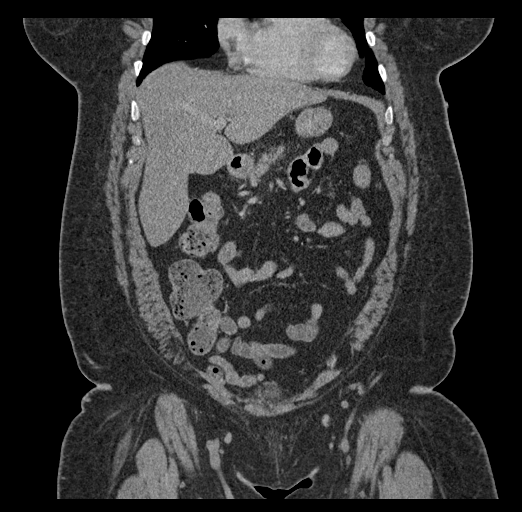
[im 80/179  soft-tissue]
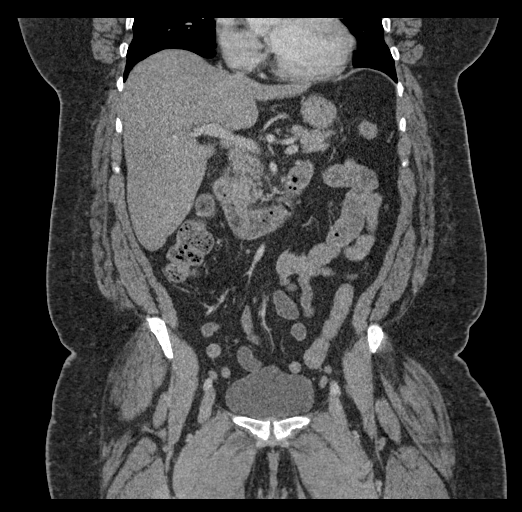
[im 99/179  soft-tissue]
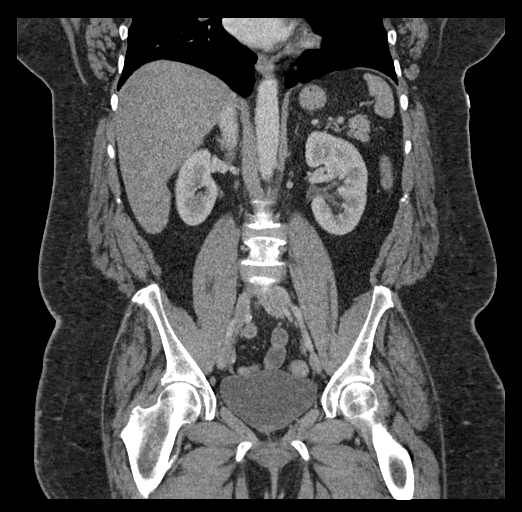

[16 of 46 positions shown; findings below may reference images not displayed]

FINDINGS: Lower chest: Lung bases are clear. Normal heart size. No pericardial
effusion.

Hepatobiliary: No focal liver abnormality is seen. Patient is post
cholecystectomy. Slight prominence of the biliary tree likely
related to reservoir effect. No calcified intraductal gallstones.

Pancreas: Mild edematous changes are noted about the pancreatic head
and uncinate. No pancreatic ductal dilatation, necrotic pancreatic
tissue, or peripancreatic collections.

Spleen: Normal in size without focal abnormality.

Adrenals/Urinary Tract: Stable 1 cm nodule in the left adrenal
gland. No worrisome new or suspicious nodules. Kidneys enhance and
excrete symmetrically. No visible, focal lesion, or hydronephrosis.
Bladder is unremarkable.

Stomach/Bowel: Distal esophagus, stomach and duodenal sweep are
unremarkable. No small bowel wall thickening or dilatation. No
evidence of obstruction. The appendix is surgically absent. No
colonic dilatation or wall thickening. Scattered colonic diverticula
without focal pericolonic inflammation to suggest diverticulitis.

Vascular/Lymphatic: Atherosclerotic plaque within the normal caliber
aorta. No suspicious or enlarged lymph nodes in the included
lymphatic chains.

Reproductive: Uterus is surgically absent. No concerning adnexal
lesions.

Other: Faint edematous changes near the pancreatic head and
uncinate. Small fat containing umbilical hernia. Mild posterior body
wall edema.

Musculoskeletal: Multilevel degenerative changes are present in the
imaged portions of the spine. No acute osseous abnormality or
suspicious osseous lesion.
IMPRESSION: 1. Mild edematous changes about the pancreatic head and uncinate
process may represent early pancreatitis. No pancreatic ductal
dilatation, necrotic pancreatic tissue or peripancreatic
collections.
2. Colonic diverticulosis without evidence of diverticulitis.
3. Stable 1 cm left adrenal nodule. Stability favors benign etiology
such as adenoma.
4. Aortic Atherosclerosis ([id number]).

## 2021-06-21 IMAGING — CR DG CHEST 2V
2 series · 2 of 2 positions shown · non-contrast
Comparison: Chest radiograph 06/15/2018

CLINICAL DATA: SOB, nausea, abdomen pain x 1 week, hx hypertension

EXAM:
CHEST - 2 VIEW

[w chest lat]
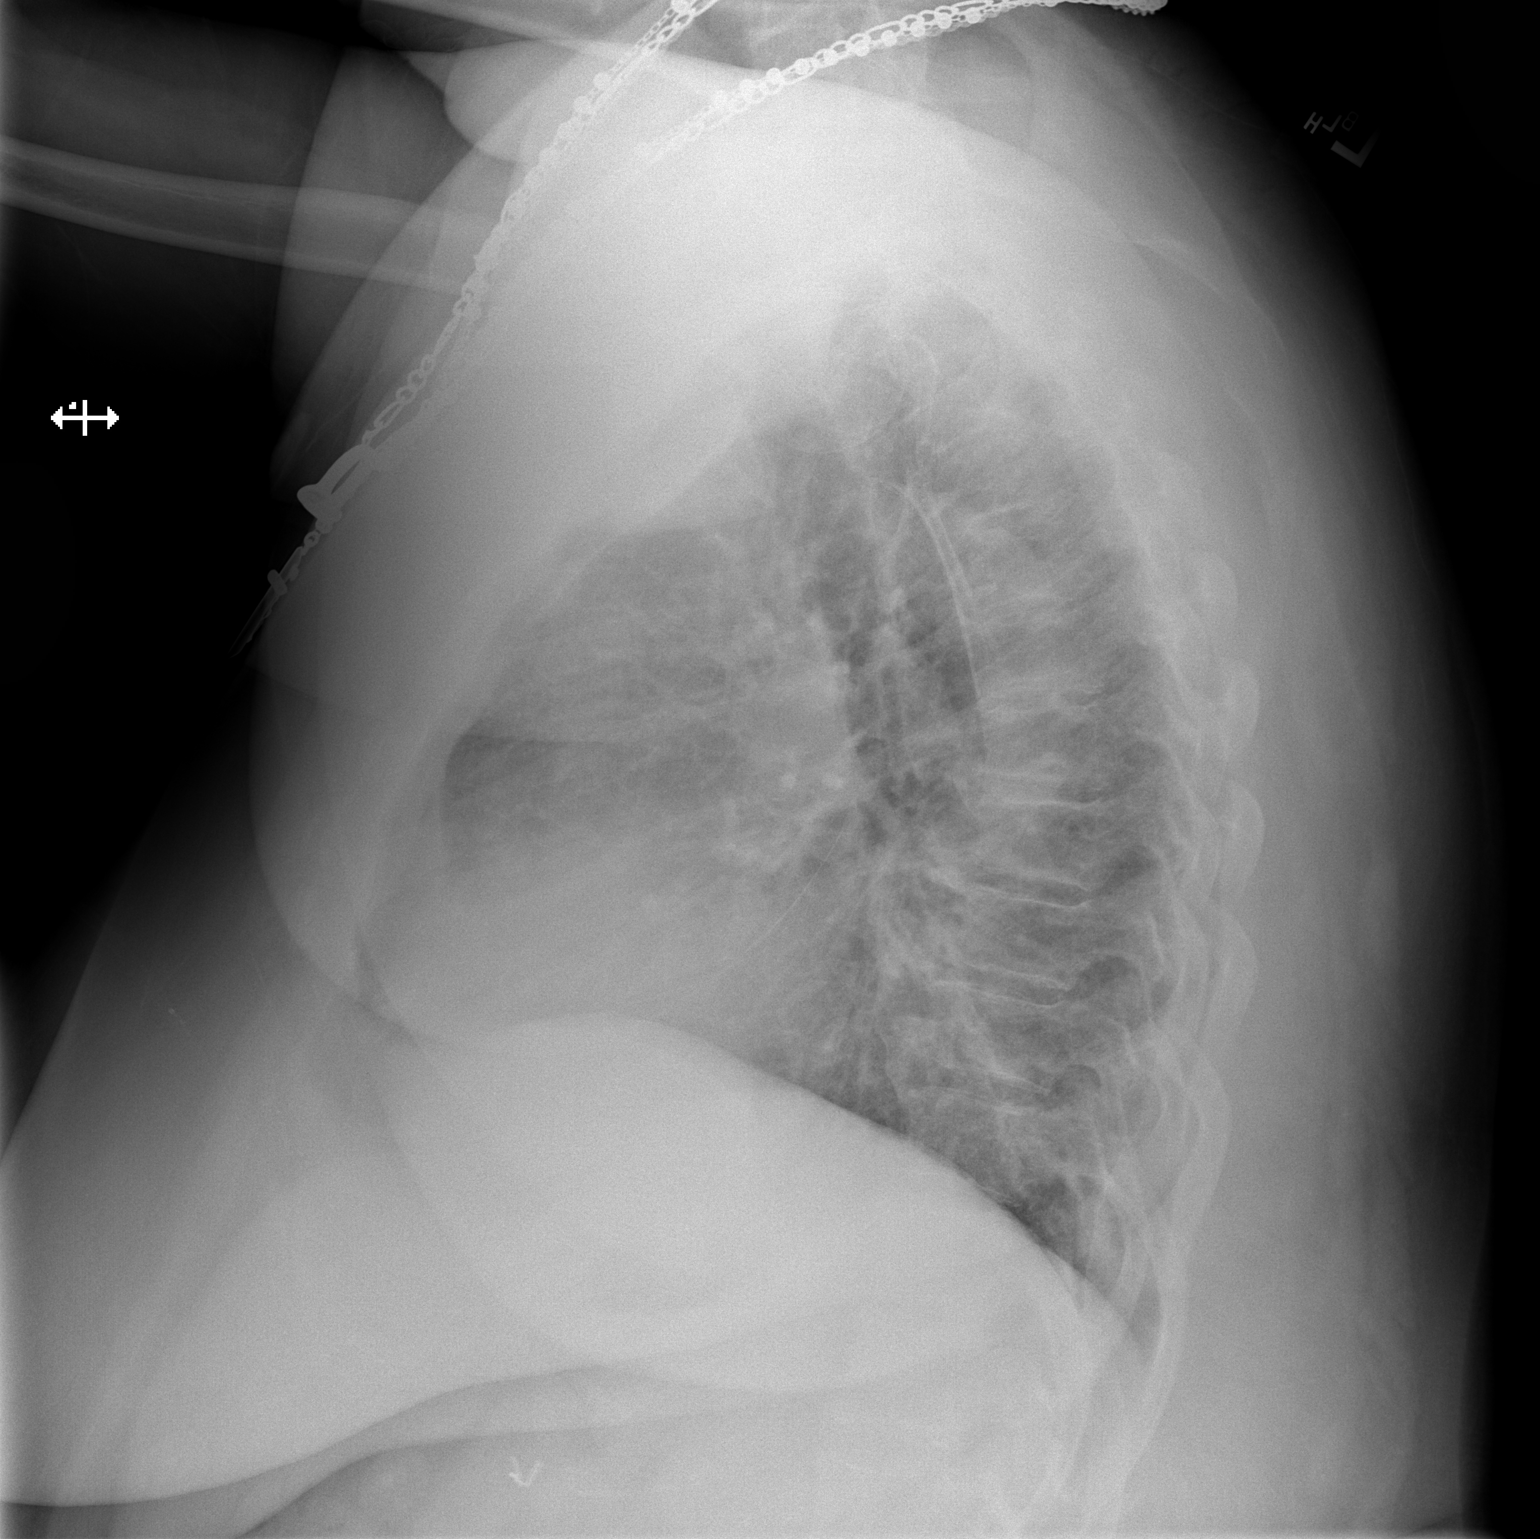

[w chest pa]
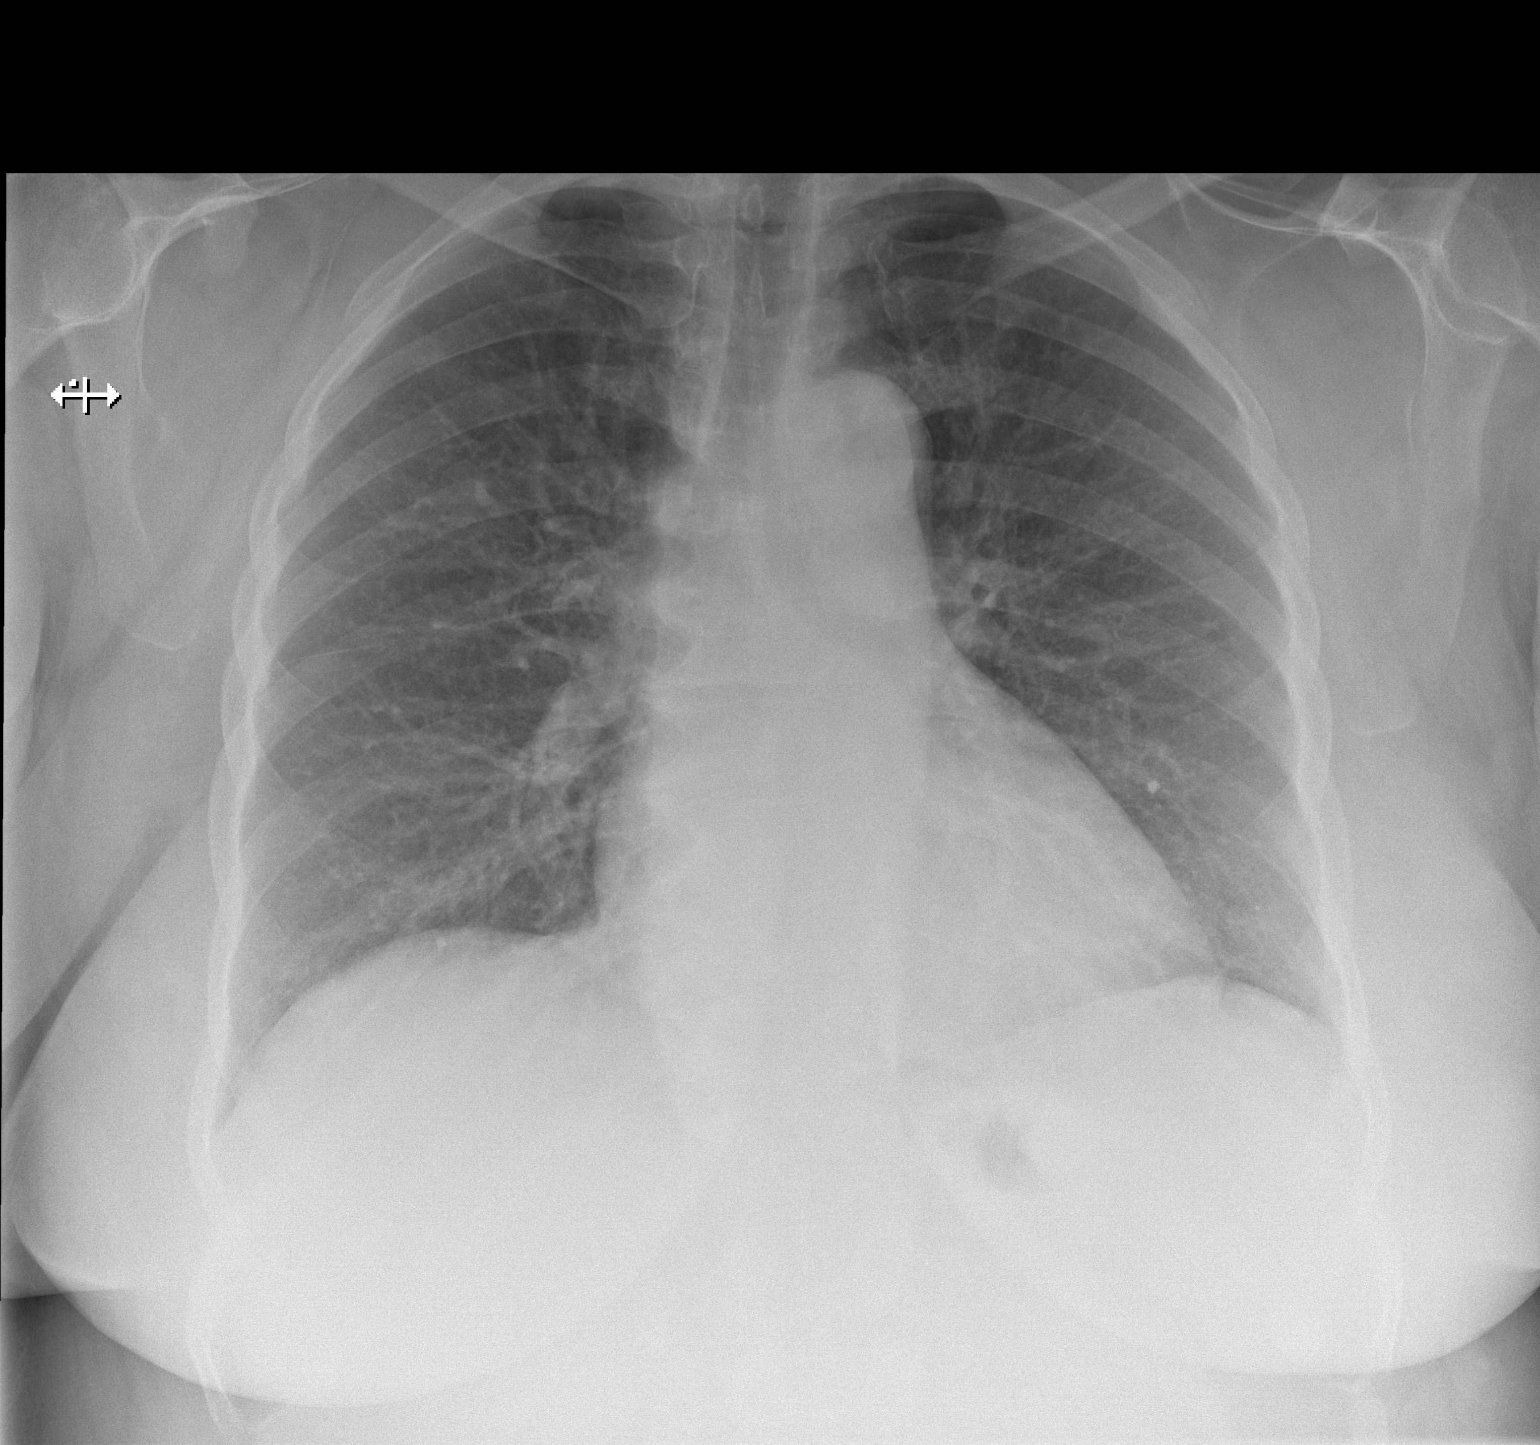

[2 of 2 positions shown; findings below may reference images not displayed]

FINDINGS: Stable cardiomediastinal contours. Chronic coarse bilateral
interstitial thickening. No new focal pulmonary opacity. No
pneumothorax or pleural effusion. Multilevel degenerative changes in
the thoracic spine.
IMPRESSION: No acute cardiopulmonary abnormality.  Chronic bronchitic change.

## 2021-06-24 ENCOUNTER — Institutional Professional Consult (permissible substitution): Payer: Medicare Other | Admitting: Pulmonary Disease

## 2021-07-08 ENCOUNTER — Other Ambulatory Visit: Payer: Self-pay | Admitting: Internal Medicine

## 2021-07-11 ENCOUNTER — Other Ambulatory Visit: Payer: Self-pay | Admitting: Otolaryngology

## 2021-07-11 DIAGNOSIS — E041 Nontoxic single thyroid nodule: Secondary | ICD-10-CM

## 2021-07-16 ENCOUNTER — Encounter (HOSPITAL_COMMUNITY): Payer: Self-pay

## 2021-08-05 ENCOUNTER — Other Ambulatory Visit: Payer: Self-pay | Admitting: Internal Medicine

## 2021-08-07 ENCOUNTER — Other Ambulatory Visit: Payer: Self-pay | Admitting: Internal Medicine

## 2021-09-03 ENCOUNTER — Encounter: Payer: Self-pay | Admitting: Internal Medicine

## 2021-09-03 NOTE — Progress Notes (Unsigned)
Subjective:    Patient ID: Tammy Strickland, female    DOB: 1961/03/24, 61 y.o.   MRN: [id number]      HPI Tammy Strickland is here for No chief complaint on file.    Hands peeling -    Chronic pain - she has chronic back pain, joint pain and fibromyalgia.  She is currently taking gabapentin 1200 mg BID, tizanidine 4 mg HS, amitriptyline 75 mg HS, diclofenac 50 mg daily, oxycodone -acet 10-325 mg Q 6 hrs  2022- [facility] did not accept her insurance 2021 - Dr Tammy Strickland - declined     Medications and allergies reviewed with patient and updated if appropriate.  Current Outpatient Medications on File Prior to Visit  Medication Sig Dispense Refill   ACCU-CHEK GUIDE test strip 1 EACH BY OTHER ROUTE FOUR TIMES DAILY DAILY FOR TESTING. 100 strip 2   Accu-Chek Softclix Lancets lancets 1 each by Other route 4 (four) times daily. E11.65 100 each 0   albuterol (VENTOLIN HFA) 108 (90 Base) MCG/ACT inhaler Inhale 2 puffs into the lungs every 6 (six) hours as needed for wheezing or shortness of breath. 8 g 8   amitriptyline (ELAVIL) 75 MG tablet TAKE 1 TABLET BY MOUTH AT BEDTIME 30 tablet 11   ascorbic acid (VITAMIN C) 500 MG tablet Take 1 tablet (500 mg total) by mouth daily. 7 tablet 0   aspirin EC 81 MG tablet Take 1 tablet (81 mg total) by mouth daily.     atorvastatin (LIPITOR) 20 MG tablet Take 1 tablet by mouth every day 30 tablet 11   Blood Glucose Monitoring Suppl (ACCU-CHEK GUIDE) w/Device KIT 1 each by Does not apply route 4 (four) times daily. E11.65     busPIRone (BUSPAR) 5 MG tablet TAKE 1 TABLET(5 MG) BY MOUTH THREE TIMES DAILY 90 tablet 1   Cholecalciferol (VITAMIN D3) 50 MCG (2000 UT) capsule Take 1 capsule (2,000 Units total) by mouth daily. 90 capsule 1   cloNIDine (CATAPRES) 0.3 MG tablet Take 1 tablet (0.3 mg total) by mouth 2 (two) times daily. 180 tablet 3   cyclobenzaprine (FEXMID) 7.5 MG tablet Take 7.5 mg by mouth 3 (three) times daily.     diclofenac (CATAFLAM)  50 MG tablet Take by mouth.     fluticasone (FLONASE) 50 MCG/ACT nasal spray Shake liquid & use 1 spray into each nostril every day as needed for allergies 16 g 11   fluticasone furoate-vilanterol (BREO ELLIPTA) 100-25 MCG/INH AEPB Inhale 1 puff into the lungs daily. 1 each 11   furosemide (LASIX) 80 MG tablet Take 1 tablet by mouth every day 30 tablet 11   gabapentin (NEURONTIN) 600 MG tablet Take 2 tablets (1,200 mg total) by mouth 2 (two) times daily. 360 tablet 0   hydrOXYzine (ATARAX/VISTARIL) 25 MG tablet Take 25 mg by mouth at bedtime.     ibuprofen (ADVIL) 600 MG tablet Take 1 tablet (600 mg total) by mouth 2 (two) times daily as needed for moderate pain. 60 tablet 3   insulin glargine, 2 Unit Dial, (TOUJEO MAX SOLOSTAR) 300 UNIT/ML Solostar Pen Inject 180 Units into the skin every morning. And pen needles 1/day (Patient taking differently: Inject 160 Units into the skin every evening. And pen needles 1/day) 60 mL 3   Insulin Pen Needle (PEN NEEDLES) 32G X 5 MM MISC UAD daily for insulin 90 each 3   Iron, Ferrous Sulfate, 325 (65 Fe) MG TABS Take 325 mg by mouth daily. 90 tablet 1  losartan (COZAAR) 50 MG tablet Take 1 tablet by mouth twice daily 60 tablet 11   metoprolol (TOPROL-XL) 200 MG 24 hr tablet Take 1 tablet by mouth twice daily 60 tablet 11   NARCAN 4 MG/0.1ML LIQD nasal spray kit Place 1 spray into the nose once.     nitroGLYCERIN (NITROSTAT) 0.4 MG SL tablet Place 0.4 mg under the tongue every 5 (five) minutes as needed for chest pain.     oxyCODONE-acetaminophen (PERCOCET) 10-325 MG tablet Take 1 tablet by mouth every 6 (six) hours.     pantoprazole (PROTONIX) 40 MG tablet Take 1 tablet by mouth twice daily 60 tablet 11   potassium chloride (KLOR-CON M) 10 MEQ tablet Take 2 tablets by mouth every day 60 tablet 11   SYMBICORT 80-4.5 MCG/ACT inhaler Inhale 2 puffs into the lungs daily as needed 10.2 each 11   tiZANidine (ZANAFLEX) 4 MG tablet Take 1 tablet by mouth at bedtime 30  tablet 5   TOUJEO SOLOSTAR 300 UNIT/ML Solostar Pen Inject 140 units subcutaneously every morning (Patient taking differently: 160 Units.) 18 mL 11   ZTLIDO 1.8 % PTCH Apply 1 patch topically daily as needed (knee pain).     No current facility-administered medications on file prior to visit.    Review of Systems     Objective:  There were no vitals filed for this visit. BP Readings from Last 3 Encounters:  05/05/21 136/72  03/17/21 (!) 157/96  01/28/21 128/82   Wt Readings from Last 3 Encounters:  05/05/21 (!) 312 lb (141.5 kg)  03/17/21 (!) 310 lb 3.2 oz (140.7 kg)  01/28/21 (!) 311 lb 12.8 oz (141.4 kg)   There is no height or weight on file to calculate BMI.    Physical Exam         Assessment & Plan:    See Problem List for Assessment and Plan of chronic medical problems.

## 2021-09-04 ENCOUNTER — Ambulatory Visit: Payer: 59 | Admitting: Internal Medicine

## 2021-09-13 ENCOUNTER — Other Ambulatory Visit: Payer: Self-pay | Admitting: Internal Medicine

## 2021-09-26 ENCOUNTER — Other Ambulatory Visit: Payer: Self-pay | Admitting: Internal Medicine

## 2021-09-29 ENCOUNTER — Encounter (HOSPITAL_COMMUNITY): Payer: Self-pay | Admitting: Radiology

## 2021-10-06 ENCOUNTER — Other Ambulatory Visit: Payer: Self-pay | Admitting: Internal Medicine

## 2021-10-06 DIAGNOSIS — I10 Essential (primary) hypertension: Secondary | ICD-10-CM

## 2021-10-07 ENCOUNTER — Encounter: Payer: Self-pay | Admitting: Internal Medicine

## 2021-10-07 ENCOUNTER — Other Ambulatory Visit: Payer: Self-pay | Admitting: Internal Medicine

## 2021-10-07 DIAGNOSIS — E1142 Type 2 diabetes mellitus with diabetic polyneuropathy: Secondary | ICD-10-CM

## 2021-10-07 MED ORDER — ACCU-CHEK GUIDE VI STRP: ORAL_STRIP | 2 refills | Status: DC

## 2021-10-07 MED ORDER — PEN NEEDLES 32G X 5 MM MISC: 3 refills | Status: DC

## 2021-10-07 NOTE — Progress Notes (Signed)
Subjective:    Patient ID: Tammy Strickland, female    DOB: [birth year], 61 y.o.   MRN: [id number]      HPI Tammy Strickland is here for  Chief Complaint  Patient presents with   Skin irritation    Peeling of the skin   Medication Refill    For an abx that was prescribed previously but cannot remember name     Skin irritation/peeling of the skin - on fingers - has dry, peeling skin that is firm.  It does not hurt or itch.  She applies Vaseline and that helps, but she has to continuously do this.    Her body itches all over.  It started months ago - it may go away for a while, but comes back.  She feels it is different areas - the itching comes and goes in different places on her body.  No new products.    She takes hydroxyzine at bedtime  Right hand - She can not open her fingers all the way - she gets cramping and pain in the middle of her hand.  The ring finger locks.  No injuries - just started hurting.  She sees ortho this week.   Infected boil - it has started draining.  She is requesting an antibiotic.  It is in her left medial proximal thigh  Medications and allergies reviewed with patient and updated if appropriate.  Current Outpatient Medications on File Prior to Visit  Medication Sig Dispense Refill   Accu-Chek Softclix Lancets lancets 1 each by Other route 4 (four) times daily. E11.65 100 each 0   albuterol (VENTOLIN HFA) 108 (90 Base) MCG/ACT inhaler Inhale 2 puffs into the lungs every 6 (six) hours as needed for wheezing or shortness of breath. 8 g 8   amitriptyline (ELAVIL) 75 MG tablet TAKE 1 TABLET BY MOUTH AT BEDTIME 30 tablet 11   ascorbic acid (VITAMIN C) 500 MG tablet Take 1 tablet (500 mg total) by mouth daily. 7 tablet 0   aspirin EC 81 MG tablet Take 1 tablet (81 mg total) by mouth daily.     atorvastatin (LIPITOR) 20 MG tablet Take 1 tablet by mouth every day 30 tablet 11   Blood Glucose Monitoring Suppl (ACCU-CHEK GUIDE) w/Device KIT 1 each by Does not apply  route 4 (four) times daily. E11.65     busPIRone (BUSPAR) 5 MG tablet TAKE 1 TABLET(5 MG) BY MOUTH THREE TIMES DAILY 90 tablet 1   Cholecalciferol (VITAMIN D3) 50 MCG (2000 UT) capsule Take 1 capsule (2,000 Units total) by mouth daily. 90 capsule 1   cloNIDine (CATAPRES) 0.3 MG tablet Take 1 tablet (0.3 mg total) by mouth 2 (two) times daily. 180 tablet 3   cyclobenzaprine (FEXMID) 7.5 MG tablet Take 7.5 mg by mouth 3 (three) times daily.     diclofenac (CATAFLAM) 50 MG tablet Take by mouth.     fluticasone (FLONASE) 50 MCG/ACT nasal spray Shake liquid & use 1 spray into each nostril every day as needed for allergies 16 g 11   fluticasone furoate-vilanterol (BREO ELLIPTA) 100-25 MCG/INH AEPB Inhale 1 puff into the lungs daily. 1 each 11   furosemide (LASIX) 80 MG tablet Take 1 tablet by mouth every day 30 tablet 11   gabapentin (NEURONTIN) 600 MG tablet Take 2 tablets (1,200 mg total) by mouth 2 (two) times daily. 360 tablet 0   glucose blood (ACCU-CHEK GUIDE) test strip 1 EACH BY OTHER ROUTE TWO TIMES DAILY DAILY FOR TESTING.  100 strip 2   hydrOXYzine (ATARAX/VISTARIL) 25 MG tablet Take 25 mg by mouth at bedtime.     ibuprofen (ADVIL) 600 MG tablet Take 1 tablet (600 mg total) by mouth 2 (two) times daily as needed for moderate pain. 60 tablet 3   insulin glargine, 2 Unit Dial, (TOUJEO MAX SOLOSTAR) 300 UNIT/ML Solostar Pen Inject 180 Units into the skin every morning. And pen needles 1/day (Patient taking differently: Inject 160 Units into the skin every evening. And pen needles 1/day) 60 mL 3   Insulin Pen Needle (PEN NEEDLES) 32G X 5 MM MISC UAD daily for insulin 90 each 3   Iron, Ferrous Sulfate, 325 (65 Fe) MG TABS Take 325 mg by mouth daily. 90 tablet 1   losartan (COZAAR) 50 MG tablet Take 1 tablet by mouth twice daily 60 tablet 11   metoprolol (TOPROL-XL) 200 MG 24 hr tablet Take 1 tablet by mouth twice daily 60 tablet 11   NARCAN 4 MG/0.1ML LIQD nasal spray kit Place 1 spray into the nose  once.     nitroGLYCERIN (NITROSTAT) 0.4 MG SL tablet Place 0.4 mg under the tongue every 5 (five) minutes as needed for chest pain.     oxyCODONE-acetaminophen (PERCOCET) 10-325 MG tablet Take 1 tablet by mouth every 6 (six) hours.     pantoprazole (PROTONIX) 40 MG tablet Take 1 tablet by mouth twice daily 60 tablet 11   potassium chloride (KLOR-CON M) 10 MEQ tablet Take 2 tablets by mouth every day 60 tablet 11   SYMBICORT 80-4.5 MCG/ACT inhaler Inhale 2 puffs into the lungs daily as needed 10.2 each 11   tiZANidine (ZANAFLEX) 4 MG tablet Take 1 tablet by mouth at bedtime 30 tablet 5   TOUJEO SOLOSTAR 300 UNIT/ML Solostar Pen Inject 140 units subcutaneously every morning (Patient taking differently: 160 Units.) 18 mL 11   ZTLIDO 1.8 % PTCH Apply 1 patch topically daily as needed (knee pain).     No current facility-administered medications on file prior to visit.    Review of Systems     Objective:   Vitals:   10/08/21 0757  BP: 122/68  Pulse: (!) 57  Temp: 98.5 F (36.9 C)  SpO2: 91%   BP Readings from Last 3 Encounters:  10/08/21 122/68  05/05/21 136/72  03/17/21 (!) 157/96   Wt Readings from Last 3 Encounters:  10/08/21 (!) 304 lb (137.9 kg)  05/05/21 (!) 312 lb (141.5 kg)  03/17/21 (!) 310 lb 3.2 oz (140.7 kg)   Body mass index is 49.07 kg/m.    Physical Exam Constitutional:      General: She is not in acute distress.    Appearance: Normal appearance.  HENT:     Head: Normocephalic and atraumatic.  Skin:    General: Skin is warm and dry.     Findings: No rash.     Comments: Mildly dry skin near distal fingers, right wrist.  Patient declined having boil examined  Neurological:     Mental Status: She is alert.            Assessment & Plan:    See Problem List for Assessment and Plan of chronic medical problems.

## 2021-10-08 ENCOUNTER — Ambulatory Visit (INDEPENDENT_AMBULATORY_CARE_PROVIDER_SITE_OTHER): Payer: 59 | Admitting: Internal Medicine

## 2021-10-08 VITALS — BP 122/68 | HR 57 | Temp 98.5°F | Ht 66.0 in | Wt 304.0 lb

## 2021-10-08 DIAGNOSIS — L089 Local infection of the skin and subcutaneous tissue, unspecified: Secondary | ICD-10-CM

## 2021-10-08 DIAGNOSIS — L853 Xerosis cutis: Secondary | ICD-10-CM | POA: Insufficient documentation

## 2021-10-08 DIAGNOSIS — I10 Essential (primary) hypertension: Secondary | ICD-10-CM | POA: Diagnosis not present

## 2021-10-08 DIAGNOSIS — L729 Follicular cyst of the skin and subcutaneous tissue, unspecified: Secondary | ICD-10-CM | POA: Diagnosis not present

## 2021-10-08 DIAGNOSIS — L299 Pruritus, unspecified: Secondary | ICD-10-CM

## 2021-10-08 MED ORDER — SULFAMETHOXAZOLE-TRIMETHOPRIM 800-160 MG PO TABS: 1.0000 | ORAL_TABLET | Freq: Two times a day (BID) | ORAL | 0 refills | Status: AC

## 2021-10-08 NOTE — Assessment & Plan Note (Signed)
Acute History of infected skin cyst/boils over the years She has 1 in her left proximal medial thigh currently that has started to drain, but is still very tender and firm for the patient She deferred exam today Given her history I will go ahead and prescribe an antibiotic, but advised her in the past we really need to examine the cyst Start Bactrim DS twice daily for 10 days Follow-up if no improvement or complete resolution

## 2021-10-08 NOTE — Patient Instructions (Addendum)
       Medications changes include :   start claritin 10 mg daily - this is over-the-counter. Bactrim twice daily for 10 days.     Your prescription(s) have been sent to your pharmacy.    A referral was ordered for dermatology.     Someone from that office will call you to schedule an appointment.    Return for follow up due in August.

## 2021-10-08 NOTE — Assessment & Plan Note (Signed)
Somewhat chronic-she has had this for few years intermittently-she is not exactly sure how long No obvious cause Currently taking hydroxyzine 25 mg at night-continue Advised starting OTC Claritin 10 mg daily Will refer to dermatology for further input, but advised her we may not be able to find a cause and we may need to just treat

## 2021-10-08 NOTE — Assessment & Plan Note (Signed)
Chronic Blood pressure well controlled Continue clonidine 0.3 mg twice daily, Lasix 80 mg daily, losartan 50 mg daily and metoprolol XL 200 mg twice daily

## 2021-10-08 NOTE — Assessment & Plan Note (Signed)
Chronic on hands b/l Has been using vaseline and that helps, but it is chronic and needs to continue to use vaseline Sounds like eczema Advised continuing to use vaseline Will refer to derm for that and generalized pruritis

## 2021-10-15 ENCOUNTER — Telehealth: Payer: Self-pay

## 2021-10-15 ENCOUNTER — Other Ambulatory Visit: Payer: Self-pay

## 2021-10-15 ENCOUNTER — Other Ambulatory Visit (INDEPENDENT_AMBULATORY_CARE_PROVIDER_SITE_OTHER): Payer: 59

## 2021-10-15 DIAGNOSIS — G8929 Other chronic pain: Secondary | ICD-10-CM

## 2021-10-15 DIAGNOSIS — M25562 Pain in left knee: Secondary | ICD-10-CM

## 2021-10-15 LAB — CBC WITH DIFFERENTIAL/PLATELET
Basophils Absolute: 0.1 10*3/uL (ref 0.0–0.1)
Basophils Relative: 0.4 % (ref 0.0–3.0)
Eosinophils Absolute: 0.4 10*3/uL (ref 0.0–0.7)
Eosinophils Relative: 3.2 % (ref 0.0–5.0)
HCT: 41.2 % (ref 36.0–46.0)
Hemoglobin: 13.5 g/dL (ref 12.0–15.0)
Lymphocytes Relative: 39 % (ref 12.0–46.0)
Lymphs Abs: 5.3 10*3/uL — ABNORMAL HIGH (ref 0.7–4.0)
MCHC: 32.7 g/dL (ref 30.0–36.0)
MCV: 85.4 fl (ref 78.0–100.0)
Monocytes Absolute: 0.9 10*3/uL (ref 0.1–1.0)
Monocytes Relative: 6.4 % (ref 3.0–12.0)
Neutro Abs: 6.9 10*3/uL (ref 1.4–7.7)
Neutrophils Relative %: 51 % (ref 43.0–77.0)
Platelets: 269 10*3/uL (ref 150.0–400.0)
RBC: 4.83 Mil/uL (ref 3.87–5.11)
RDW: 15 % (ref 11.5–15.5)
WBC: 13.6 10*3/uL — ABNORMAL HIGH (ref 4.0–10.5)

## 2021-10-15 LAB — C-REACTIVE PROTEIN: CRP: <1 mg/dL (ref 0.5–20.0)

## 2021-10-15 LAB — SEDIMENTATION RATE: Sed Rate: 50 mm/hr — ABNORMAL HIGH (ref 0–30)

## 2021-10-27 ENCOUNTER — Ambulatory Visit (INDEPENDENT_AMBULATORY_CARE_PROVIDER_SITE_OTHER): Payer: Medicare Other | Admitting: Podiatry

## 2021-10-27 DIAGNOSIS — L97511 Non-pressure chronic ulcer of other part of right foot limited to breakdown of skin: Secondary | ICD-10-CM

## 2021-10-27 DIAGNOSIS — M79675 Pain in left toe(s): Secondary | ICD-10-CM

## 2021-10-27 DIAGNOSIS — I739 Peripheral vascular disease, unspecified: Secondary | ICD-10-CM

## 2021-10-27 DIAGNOSIS — B351 Tinea unguium: Secondary | ICD-10-CM

## 2021-10-27 DIAGNOSIS — E0840 Diabetes mellitus due to underlying condition with diabetic neuropathy, unspecified: Secondary | ICD-10-CM

## 2021-10-27 DIAGNOSIS — M79674 Pain in right toe(s): Secondary | ICD-10-CM | POA: Diagnosis not present

## 2021-10-27 DIAGNOSIS — Z794 Long term (current) use of insulin: Secondary | ICD-10-CM

## 2021-10-27 MED ORDER — CEPHALEXIN 500 MG PO CAPS: 500.0000 mg | ORAL_CAPSULE | Freq: Three times a day (TID) | ORAL | 0 refills | Status: DC

## 2021-10-27 NOTE — Progress Notes (Signed)
Subjective: 61 year old female presents the office with concerns of pain to her right second and third toe.  She states that this started after she noted some skin growing over top of it she used a needle to pick this off.  Since then she has had pain.  She has a wound to the tip of her right third toe which she was not aware of.  She is concerned about possible infection.  She has not seen any drainage or pus.  No recent injuries that she reports.    Objective: AAO x3, NAD DP/PT pulses palpable bilaterally, CRT less than 3 seconds On the hallux toenails no nail is present.  Also there is some hyperkeratotic tissue at the nail bed along the right third and second digit toenails.  The distal aspect of the right third toe is a superficial granular wound without any probing, amount or tunneling.  There is mild edema there is no erythema or warmth.  There is mild tenderness palpation along the second third toe starting at the PIPJ level distally. The remaining nails are hypertrophic, dystrophic with yellow, brown discoloration.  No edema, erythema or tenderness.  Nails affected are 2 through 5 on the left and 4 and 5 on the right. No pain with calf compression, swelling, warmth, erythema     Assessment: Right third toe ulceration, swelling  Plan: -All treatment options discussed with the patient including all alternatives, risks, complications.  -Patient was to hold off on x-rays today.  If no improvement will get x-rays next appointment. -Surgical shoe for offloading -Keflex -Nails debrided x 6 without complicaitons or bleeding -Once healed can try urea to help with the skin.  Advised her not to try to trim the skin herself. -BPA- ordered ABI -Patient encouraged to call the office with any questions, concerns, change in symptoms.   Trula Slade DPM

## 2021-11-06 DIAGNOSIS — M25552 Pain in left hip: Secondary | ICD-10-CM | POA: Diagnosis not present

## 2021-11-06 DIAGNOSIS — Z79891 Long term (current) use of opiate analgesic: Secondary | ICD-10-CM | POA: Diagnosis not present

## 2021-11-06 DIAGNOSIS — J0110 Acute frontal sinusitis, unspecified: Secondary | ICD-10-CM | POA: Diagnosis not present

## 2021-11-06 DIAGNOSIS — M545 Low back pain, unspecified: Secondary | ICD-10-CM | POA: Diagnosis not present

## 2021-11-06 DIAGNOSIS — M25579 Pain in unspecified ankle and joints of unspecified foot: Secondary | ICD-10-CM | POA: Diagnosis not present

## 2021-11-06 DIAGNOSIS — M79604 Pain in right leg: Secondary | ICD-10-CM | POA: Diagnosis not present

## 2021-11-06 DIAGNOSIS — J3089 Other allergic rhinitis: Secondary | ICD-10-CM | POA: Diagnosis not present

## 2021-11-06 DIAGNOSIS — M25562 Pain in left knee: Secondary | ICD-10-CM | POA: Diagnosis not present

## 2021-11-06 DIAGNOSIS — M25549 Pain in joints of unspecified hand: Secondary | ICD-10-CM | POA: Diagnosis not present

## 2021-11-06 DIAGNOSIS — M5136 Other intervertebral disc degeneration, lumbar region: Secondary | ICD-10-CM | POA: Diagnosis not present

## 2021-11-06 DIAGNOSIS — G894 Chronic pain syndrome: Secondary | ICD-10-CM | POA: Diagnosis not present

## 2021-11-06 DIAGNOSIS — J0111 Acute recurrent frontal sinusitis: Secondary | ICD-10-CM | POA: Diagnosis not present

## 2021-11-06 DIAGNOSIS — J45991 Cough variant asthma: Secondary | ICD-10-CM | POA: Diagnosis not present

## 2021-11-06 DIAGNOSIS — M170 Bilateral primary osteoarthritis of knee: Secondary | ICD-10-CM | POA: Diagnosis not present

## 2021-11-10 ENCOUNTER — Ambulatory Visit (HOSPITAL_COMMUNITY)
Admission: RE | Admit: 2021-11-10 | Discharge: 2021-11-10 | Disposition: A | Discharge status: Home / Self Care | Payer: Medicare Other | Source: Ambulatory Visit | Attending: Podiatry | Admitting: Podiatry

## 2021-11-10 DIAGNOSIS — I739 Peripheral vascular disease, unspecified: Secondary | ICD-10-CM | POA: Diagnosis not present

## 2021-11-11 DIAGNOSIS — M170 Bilateral primary osteoarthritis of knee: Secondary | ICD-10-CM | POA: Diagnosis not present

## 2021-11-11 DIAGNOSIS — M1712 Unilateral primary osteoarthritis, left knee: Secondary | ICD-10-CM | POA: Diagnosis not present

## 2021-11-17 ENCOUNTER — Other Ambulatory Visit: Payer: Self-pay | Admitting: Internal Medicine

## 2021-11-17 ENCOUNTER — Other Ambulatory Visit: Payer: Self-pay

## 2021-11-17 DIAGNOSIS — Z794 Long term (current) use of insulin: Secondary | ICD-10-CM

## 2021-11-25 ENCOUNTER — Other Ambulatory Visit (HOSPITAL_COMMUNITY): Payer: Self-pay | Admitting: Orthopedic Surgery

## 2021-11-25 ENCOUNTER — Other Ambulatory Visit: Payer: Self-pay | Admitting: Orthopedic Surgery

## 2021-11-25 DIAGNOSIS — Z96652 Presence of left artificial knee joint: Secondary | ICD-10-CM

## 2021-11-25 DIAGNOSIS — M1711 Unilateral primary osteoarthritis, right knee: Secondary | ICD-10-CM | POA: Insufficient documentation

## 2021-11-25 DIAGNOSIS — M170 Bilateral primary osteoarthritis of knee: Secondary | ICD-10-CM | POA: Diagnosis not present

## 2021-12-02 ENCOUNTER — Encounter: Payer: Self-pay | Admitting: Internal Medicine

## 2021-12-02 NOTE — Progress Notes (Unsigned)
Subjective:    Patient ID: Tammy Strickland, female    DOB: [birth year], 61 y.o.   MRN: [id number]     HPI Tammy Strickland is here for follow up of her chronic medical problems, including htn, DM, neuropathy, hld, gerd, fatty liver, COPD, severe OA  She has not followed up with endocrine as I have asked for review.  She states she is taking the insulin as directed by Dr. Loanne Drilling and when reviewed with her in detail she is actually taking 320 units daily-160 units twice daily.  She will have a bone scan of her left knee tomorrow.  She is s/p replacement and has had persistent pain since then.  Her right knee is also very painful and she will need eventual replacement.  Under left breast has lump there - no pain.  Noticed it three days ago.  It felt different when she was washing herself in the shower.  Her last mammogram was a while ago.   Medications and allergies reviewed with patient and updated if appropriate.  Current Outpatient Medications on File Prior to Visit  Medication Sig Dispense Refill   Accu-Chek Softclix Lancets lancets 1 EACH BY OTHER ROUTE FOUR TIMES DAILY. 100 each 0   albuterol (VENTOLIN HFA) 108 (90 Base) MCG/ACT inhaler Inhale 2 puffs into the lungs every 6 (six) hours as needed for wheezing or shortness of breath. 8 g 8   amitriptyline (ELAVIL) 75 MG tablet TAKE 1 TABLET BY MOUTH AT BEDTIME 30 tablet 11   ascorbic acid (VITAMIN C) 500 MG tablet Take 1 tablet (500 mg total) by mouth daily. 7 tablet 0   aspirin EC 81 MG tablet Take 1 tablet (81 mg total) by mouth daily.     atorvastatin (LIPITOR) 20 MG tablet Take 1 tablet by mouth every day 30 tablet 11   busPIRone (BUSPAR) 5 MG tablet TAKE 1 TABLET(5 MG) BY MOUTH THREE TIMES DAILY 90 tablet 1   Cholecalciferol (VITAMIN D3) 50 MCG (2000 UT) capsule Take 1 capsule (2,000 Units total) by mouth daily. 90 capsule 1   cloNIDine (CATAPRES) 0.3 MG tablet Take 1 tablet by mouth 3 times a day 90 tablet 11   cyclobenzaprine  (FEXMID) 7.5 MG tablet Take 7.5 mg by mouth 3 (three) times daily.     diclofenac (CATAFLAM) 50 MG tablet Take by mouth.     DULoxetine (CYMBALTA) 60 MG capsule Take 60 mg by mouth daily.     fluticasone (FLONASE) 50 MCG/ACT nasal spray Shake liquid & use 1 spray into each nostril every day as needed for allergies 16 g 11   fluticasone furoate-vilanterol (BREO ELLIPTA) 100-25 MCG/INH AEPB Inhale 1 puff into the lungs daily. 1 each 11   furosemide (LASIX) 80 MG tablet Take 1 tablet by mouth every day 30 tablet 11   gabapentin (NEURONTIN) 600 MG tablet Take 2 tablets (1,200 mg total) by mouth 2 (two) times daily. 360 tablet 0   glucose blood (ACCU-CHEK GUIDE) test strip TEST TWICE DAILY 200 strip 2   hydrOXYzine (ATARAX/VISTARIL) 25 MG tablet Take 25 mg by mouth at bedtime.     ibuprofen (ADVIL) 600 MG tablet Take 1 tablet (600 mg total) by mouth 2 (two) times daily as needed for moderate pain. 60 tablet 3   insulin glargine, 2 Unit Dial, (TOUJEO MAX SOLOSTAR) 300 UNIT/ML Solostar Pen Inject 180 Units into the skin every morning. And pen needles 1/day (Patient taking differently: Inject 160 Units into the skin every evening.  And pen needles 1/day) 60 mL 3   Insulin Pen Needle (PEN NEEDLES) 32G X 5 MM MISC UAD daily for insulin 90 each 3   Iron, Ferrous Sulfate, 325 (65 Fe) MG TABS Take 325 mg by mouth daily. 90 tablet 1   losartan (COZAAR) 50 MG tablet Take 1 tablet by mouth twice daily 60 tablet 11   metoprolol (TOPROL-XL) 200 MG 24 hr tablet Take 1 tablet by mouth twice daily 60 tablet 11   NARCAN 4 MG/0.1ML LIQD nasal spray kit Place 1 spray into the nose once.     nitroGLYCERIN (NITROSTAT) 0.4 MG SL tablet Place 0.4 mg under the tongue every 5 (five) minutes as needed for chest pain.     ondansetron (ZOFRAN) 4 MG tablet Take 4 mg by mouth every 8 (eight) hours as needed.     oxyCODONE-acetaminophen (PERCOCET) 10-325 MG tablet Take 1 tablet by mouth every 6 (six) hours.     pantoprazole  (PROTONIX) 40 MG tablet Take 1 tablet by mouth twice daily 60 tablet 11   potassium chloride (KLOR-CON M) 10 MEQ tablet Take 2 tablets by mouth every day 60 tablet 11   SYMBICORT 80-4.5 MCG/ACT inhaler Inhale 2 puffs into the lungs daily as needed 10.2 each 11   tiZANidine (ZANAFLEX) 4 MG tablet Take 1 tablet by mouth at bedtime 30 tablet 5   ZTLIDO 1.8 % PTCH Apply 1 patch topically daily as needed (knee pain).     No current facility-administered medications on file prior to visit.     Review of Systems  Constitutional:  Negative for fever.  Respiratory:  Positive for cough (occ). Negative for shortness of breath and wheezing.   Cardiovascular:  Positive for leg swelling. Negative for chest pain and palpitations.  Genitourinary:  Negative for hematuria.  Neurological:  Negative for light-headedness and headaches.       Objective:   Vitals:   12/03/21 1009  BP: 134/82  Pulse: 80  Temp: 98.2 F (36.8 C)  SpO2: 96%   BP Readings from Last 3 Encounters:  12/03/21 134/82  10/08/21 122/68  05/05/21 136/72   Wt Readings from Last 3 Encounters:  12/03/21 296 lb (134.3 kg)  10/08/21 (!) 304 lb (137.9 kg)  05/05/21 (!) 312 lb (141.5 kg)   Body mass index is 47.78 kg/m.    Physical Exam Constitutional:      General: She is not in acute distress.    Appearance: Normal appearance.  HENT:     Head: Normocephalic and atraumatic.  Eyes:     Conjunctiva/sclera: Conjunctivae normal.  Cardiovascular:     Rate and Rhythm: Normal rate and regular rhythm.     Heart sounds: Normal heart sounds. No murmur heard. Pulmonary:     Effort: Pulmonary effort is normal. No respiratory distress.     Breath sounds: Normal breath sounds. No wheezing.  Chest:  Breasts:    Left: Mass (Possible lump versus fibrocystic tissue 7:00-nontender) present. No swelling, nipple discharge, skin change or tenderness.  Musculoskeletal:     Cervical back: Neck supple.     Right lower leg: Edema (trace)  present.     Left lower leg: Edema (trace) present.  Lymphadenopathy:     Cervical: No cervical adenopathy.  Skin:    General: Skin is warm and dry.     Findings: No rash.  Neurological:     Mental Status: She is alert. Mental status is at baseline.  Psychiatric:  Mood and Affect: Mood normal.        Behavior: Behavior normal.        Lab Results  Component Value Date   WBC 13.6 (H) 10/15/2021   HGB 13.5 10/15/2021   HCT 41.2 10/15/2021   PLT 269.0 10/15/2021   GLUCOSE 122 (H) 05/05/2021   CHOL 132 05/05/2021   TRIG 184.0 (H) 05/05/2021   HDL 46.80 05/05/2021   LDLDIRECT 101.0 09/19/2019   LDLCALC 49 05/05/2021   ALT 21 05/05/2021   AST 27 05/05/2021   NA 139 05/05/2021   K 4.1 05/05/2021   CL 100 05/05/2021   CREATININE 0.72 05/05/2021   BUN 12 05/05/2021   CO2 31 05/05/2021   TSH 5.02 01/07/2021   INR 1.06 11/07/2015   HGBA1C 8.5 (H) 05/05/2021   MICROALBUR <0.7 05/05/2021     Assessment & Plan:    See Problem List for Assessment and Plan of chronic medical problems.

## 2021-12-02 NOTE — Patient Instructions (Addendum)
     Blood work was ordered.     Medications changes include :   none   A mammogram was ordered   A referral was ordered for Endocrine.     Someone from that office will call you to schedule an appointment.    Return in about 6 months (around 06/05/2022) for follow up.

## 2021-12-03 ENCOUNTER — Ambulatory Visit (INDEPENDENT_AMBULATORY_CARE_PROVIDER_SITE_OTHER): Payer: Medicare Other | Admitting: Internal Medicine

## 2021-12-03 VITALS — BP 134/82 | HR 80 | Temp 98.2°F | Ht 66.0 in | Wt 296.0 lb

## 2021-12-03 DIAGNOSIS — E559 Vitamin D deficiency, unspecified: Secondary | ICD-10-CM

## 2021-12-03 DIAGNOSIS — E1142 Type 2 diabetes mellitus with diabetic polyneuropathy: Secondary | ICD-10-CM | POA: Diagnosis not present

## 2021-12-03 DIAGNOSIS — I10 Essential (primary) hypertension: Secondary | ICD-10-CM

## 2021-12-03 DIAGNOSIS — N6324 Unspecified lump in the left breast, lower inner quadrant: Secondary | ICD-10-CM | POA: Diagnosis not present

## 2021-12-03 DIAGNOSIS — E782 Mixed hyperlipidemia: Secondary | ICD-10-CM | POA: Diagnosis not present

## 2021-12-03 DIAGNOSIS — G6289 Other specified polyneuropathies: Secondary | ICD-10-CM | POA: Diagnosis not present

## 2021-12-03 DIAGNOSIS — G894 Chronic pain syndrome: Secondary | ICD-10-CM

## 2021-12-03 DIAGNOSIS — K219 Gastro-esophageal reflux disease without esophagitis: Secondary | ICD-10-CM | POA: Diagnosis not present

## 2021-12-03 DIAGNOSIS — F419 Anxiety disorder, unspecified: Secondary | ICD-10-CM

## 2021-12-03 DIAGNOSIS — F17210 Nicotine dependence, cigarettes, uncomplicated: Secondary | ICD-10-CM | POA: Diagnosis not present

## 2021-12-03 DIAGNOSIS — Z794 Long term (current) use of insulin: Secondary | ICD-10-CM

## 2021-12-03 DIAGNOSIS — F3289 Other specified depressive episodes: Secondary | ICD-10-CM

## 2021-12-03 LAB — CBC WITH DIFFERENTIAL/PLATELET
Basophils Absolute: 0 10*3/uL (ref 0.0–0.1)
Basophils Relative: 0.6 % (ref 0.0–3.0)
Eosinophils Absolute: 0.2 10*3/uL (ref 0.0–0.7)
Eosinophils Relative: 2.6 % (ref 0.0–5.0)
HCT: 39.9 % (ref 36.0–46.0)
Hemoglobin: 13.1 g/dL (ref 12.0–15.0)
Lymphocytes Relative: 41.3 % (ref 12.0–46.0)
Lymphs Abs: 3.3 10*3/uL (ref 0.7–4.0)
MCHC: 32.9 g/dL (ref 30.0–36.0)
MCV: 85.1 fl (ref 78.0–100.0)
Monocytes Absolute: 0.7 10*3/uL (ref 0.1–1.0)
Monocytes Relative: 8.6 % (ref 3.0–12.0)
Neutro Abs: 3.7 10*3/uL (ref 1.4–7.7)
Neutrophils Relative %: 46.9 % (ref 43.0–77.0)
Platelets: 223 10*3/uL (ref 150.0–400.0)
RBC: 4.69 Mil/uL (ref 3.87–5.11)
RDW: 15.4 % (ref 11.5–15.5)
WBC: 7.9 10*3/uL (ref 4.0–10.5)

## 2021-12-03 LAB — COMPREHENSIVE METABOLIC PANEL
ALT: 27 U/L (ref 0–35)
AST: 37 U/L (ref 0–37)
Albumin: 3.9 g/dL (ref 3.5–5.2)
Alkaline Phosphatase: 159 U/L — ABNORMAL HIGH (ref 39–117)
BUN: 7 mg/dL (ref 6–23)
CO2: 32 mEq/L (ref 19–32)
Calcium: 8.6 mg/dL (ref 8.4–10.5)
Chloride: 95 mEq/L — ABNORMAL LOW (ref 96–112)
Creatinine, Ser: 0.7 mg/dL (ref 0.40–1.20)
GFR: 94.5 mL/min (ref >60.00)
Glucose, Bld: 283 mg/dL — ABNORMAL HIGH (ref 70–99)
Potassium: 3.8 mEq/L (ref 3.5–5.1)
Sodium: 136 mEq/L (ref 135–145)
Total Bilirubin: 0.5 mg/dL (ref 0.2–1.2)
Total Protein: 7.3 g/dL (ref 6.0–8.3)

## 2021-12-03 LAB — LIPID PANEL
Cholesterol: 132 mg/dL (ref 0–200)
HDL: 53 mg/dL (ref >39.00)
LDL Cholesterol: 39 mg/dL (ref 0–99)
NonHDL: 79.34
Total CHOL/HDL Ratio: 2
Triglycerides: 200 mg/dL — ABNORMAL HIGH (ref 0.0–149.0)
VLDL: 40 mg/dL (ref 0.0–40.0)

## 2021-12-03 LAB — VITAMIN D 25 HYDROXY (VIT D DEFICIENCY, FRACTURES): VITD: 19.4 ng/mL — ABNORMAL LOW (ref 30.00–100.00)

## 2021-12-03 LAB — HEMOGLOBIN A1C: Hgb A1c MFr Bld: 8.9 % — ABNORMAL HIGH (ref 4.6–6.5)

## 2021-12-03 NOTE — Assessment & Plan Note (Addendum)
Chronic generalized anxiety with panic attacks Controlled Continue amitriptyline 75 mg at bedtime, hydroxyzine 25 mg at bedtime and BuSpar 5 mg 3 times daily

## 2021-12-03 NOTE — Assessment & Plan Note (Addendum)
Chronic Smoking 1/2 ppd Wants to quit - knows she will quit when she has surgery Stressed quitting smoking as soon as possible

## 2021-12-03 NOTE — Assessment & Plan Note (Signed)
Chronic °Regular exercise and healthy diet encouraged °Check lipid panel  °Continue atorvastatin 20 mg daily °

## 2021-12-03 NOTE — Assessment & Plan Note (Signed)
Chronic GERD controlled Continue pantoprazole 40 mg twice daily 

## 2021-12-03 NOTE — Assessment & Plan Note (Signed)
Chronic Multifactorial Following with pain management Continue gabapentin 1200 mg twice daily, tizanidine 4 mg at bedtime and amitriptyline 75 mg at night Prescribed Percocet 10-325 mg every 6 hours by pain management

## 2021-12-03 NOTE — Assessment & Plan Note (Signed)
Chronic Blood pressure well controlled CMP Continue clonidine 0.3 mg 3 times daily, furosemide 80 mg daily, losartan 50 mg daily, metoprolol XL 200 mg daily

## 2021-12-03 NOTE — Assessment & Plan Note (Signed)
Chronic Continue gabapentin 1200 mg twice daily and amitriptyline 75 mg at bedtime

## 2021-12-03 NOTE — Assessment & Plan Note (Signed)
New Possible lump or fibrocystic tissue 7:00 left breast-nonpainful She noticed it 3 days ago-it is very difficult to know if there is a discrete lump there just some fibrocystic tissue Overdue for mammogram Diagnostic mammogram, ultrasound left breast ordered

## 2021-12-03 NOTE — Assessment & Plan Note (Signed)
Chronic  Lab Results  Component Value Date   HGBA1C 8.5 (H) 05/05/2021   Has not followed up with endocrine Sugars not well controlled Check A1c Continue Toujeo insulin 160 units daily Stressed regular exercise, diabetic diet Stressed weight loss

## 2021-12-03 NOTE — Assessment & Plan Note (Addendum)
Chronic  Controlled Continue amitriptyline 75 mg at bedtime

## 2021-12-03 NOTE — Assessment & Plan Note (Signed)
Chronic Taking vitamin D daily Check vitamin D level  

## 2021-12-04 ENCOUNTER — Other Ambulatory Visit (HOSPITAL_COMMUNITY): Payer: Medicaid Other

## 2021-12-04 ENCOUNTER — Encounter (HOSPITAL_COMMUNITY)
Admission: RE | Admit: 2021-12-04 | Discharge: 2021-12-04 | Disposition: A | Discharge status: Home / Self Care | Payer: Medicare Other | Source: Ambulatory Visit | Attending: Orthopedic Surgery | Admitting: Orthopedic Surgery

## 2021-12-04 DIAGNOSIS — M79604 Pain in right leg: Secondary | ICD-10-CM | POA: Diagnosis not present

## 2021-12-04 DIAGNOSIS — Z96652 Presence of left artificial knee joint: Secondary | ICD-10-CM | POA: Insufficient documentation

## 2021-12-04 DIAGNOSIS — M545 Low back pain, unspecified: Secondary | ICD-10-CM | POA: Diagnosis not present

## 2021-12-04 DIAGNOSIS — Z79891 Long term (current) use of opiate analgesic: Secondary | ICD-10-CM | POA: Diagnosis not present

## 2021-12-04 DIAGNOSIS — G894 Chronic pain syndrome: Secondary | ICD-10-CM | POA: Diagnosis not present

## 2021-12-04 DIAGNOSIS — M7989 Other specified soft tissue disorders: Secondary | ICD-10-CM | POA: Diagnosis not present

## 2021-12-04 DIAGNOSIS — J3089 Other allergic rhinitis: Secondary | ICD-10-CM | POA: Diagnosis not present

## 2021-12-04 DIAGNOSIS — M1711 Unilateral primary osteoarthritis, right knee: Secondary | ICD-10-CM | POA: Diagnosis not present

## 2021-12-04 DIAGNOSIS — M25552 Pain in left hip: Secondary | ICD-10-CM | POA: Diagnosis not present

## 2021-12-04 DIAGNOSIS — M5136 Other intervertebral disc degeneration, lumbar region: Secondary | ICD-10-CM | POA: Diagnosis not present

## 2021-12-04 DIAGNOSIS — R296 Repeated falls: Secondary | ICD-10-CM | POA: Diagnosis not present

## 2021-12-04 DIAGNOSIS — J0111 Acute recurrent frontal sinusitis: Secondary | ICD-10-CM | POA: Diagnosis not present

## 2021-12-04 DIAGNOSIS — J0110 Acute frontal sinusitis, unspecified: Secondary | ICD-10-CM | POA: Diagnosis not present

## 2021-12-04 DIAGNOSIS — M797 Fibromyalgia: Secondary | ICD-10-CM | POA: Diagnosis not present

## 2021-12-04 DIAGNOSIS — M170 Bilateral primary osteoarthritis of knee: Secondary | ICD-10-CM | POA: Diagnosis not present

## 2021-12-04 DIAGNOSIS — M25562 Pain in left knee: Secondary | ICD-10-CM | POA: Diagnosis not present

## 2021-12-04 DIAGNOSIS — M25549 Pain in joints of unspecified hand: Secondary | ICD-10-CM | POA: Diagnosis not present

## 2021-12-04 MED ORDER — TECHNETIUM TC 99M MEDRONATE IV KIT
20.0000 | PACK | Freq: Once | INTRAVENOUS | Status: AC | PRN
  Administered 2021-12-04: 20 via INTRAVENOUS

## 2021-12-04 MED ORDER — VITAMIN D (ERGOCALCIFEROL) 1.25 MG (50000 UNIT) PO CAPS: 50000.0000 [IU] | ORAL_CAPSULE | ORAL | 0 refills | Status: AC

## 2021-12-04 NOTE — Addendum Note (Signed)
Addended by: Binnie Rail on: 12/04/2021 08:24 PM   Modules accepted: Orders

## 2021-12-21 ENCOUNTER — Other Ambulatory Visit: Payer: Self-pay | Admitting: Internal Medicine

## 2021-12-22 ENCOUNTER — Other Ambulatory Visit: Payer: Medicaid Other

## 2021-12-25 ENCOUNTER — Ambulatory Visit
Admission: RE | Admit: 2021-12-25 | Discharge: 2021-12-25 | Disposition: A | Discharge status: Home / Self Care | Payer: Medicaid Other | Source: Ambulatory Visit | Attending: Internal Medicine | Admitting: Internal Medicine

## 2021-12-25 DIAGNOSIS — N6489 Other specified disorders of breast: Secondary | ICD-10-CM | POA: Diagnosis not present

## 2021-12-25 DIAGNOSIS — N6324 Unspecified lump in the left breast, lower inner quadrant: Secondary | ICD-10-CM

## 2021-12-25 DIAGNOSIS — R928 Other abnormal and inconclusive findings on diagnostic imaging of breast: Secondary | ICD-10-CM | POA: Diagnosis not present

## 2022-01-01 DIAGNOSIS — M25562 Pain in left knee: Secondary | ICD-10-CM | POA: Diagnosis not present

## 2022-01-01 DIAGNOSIS — G43909 Migraine, unspecified, not intractable, without status migrainosus: Secondary | ICD-10-CM | POA: Diagnosis not present

## 2022-01-01 DIAGNOSIS — Z79891 Long term (current) use of opiate analgesic: Secondary | ICD-10-CM | POA: Diagnosis not present

## 2022-01-01 DIAGNOSIS — M170 Bilateral primary osteoarthritis of knee: Secondary | ICD-10-CM | POA: Diagnosis not present

## 2022-01-01 DIAGNOSIS — M79604 Pain in right leg: Secondary | ICD-10-CM | POA: Diagnosis not present

## 2022-01-01 DIAGNOSIS — J45991 Cough variant asthma: Secondary | ICD-10-CM | POA: Diagnosis not present

## 2022-01-01 DIAGNOSIS — J0110 Acute frontal sinusitis, unspecified: Secondary | ICD-10-CM | POA: Diagnosis not present

## 2022-01-01 DIAGNOSIS — M25549 Pain in joints of unspecified hand: Secondary | ICD-10-CM | POA: Diagnosis not present

## 2022-01-01 DIAGNOSIS — M545 Low back pain, unspecified: Secondary | ICD-10-CM | POA: Diagnosis not present

## 2022-01-01 DIAGNOSIS — M25579 Pain in unspecified ankle and joints of unspecified foot: Secondary | ICD-10-CM | POA: Diagnosis not present

## 2022-01-01 DIAGNOSIS — G894 Chronic pain syndrome: Secondary | ICD-10-CM | POA: Diagnosis not present

## 2022-01-01 DIAGNOSIS — M792 Neuralgia and neuritis, unspecified: Secondary | ICD-10-CM | POA: Diagnosis not present

## 2022-01-01 DIAGNOSIS — M25552 Pain in left hip: Secondary | ICD-10-CM | POA: Diagnosis not present

## 2022-01-01 DIAGNOSIS — M5136 Other intervertebral disc degeneration, lumbar region: Secondary | ICD-10-CM | POA: Diagnosis not present

## 2022-01-04 ENCOUNTER — Other Ambulatory Visit: Payer: Self-pay | Admitting: Internal Medicine

## 2022-01-19 DIAGNOSIS — Z96652 Presence of left artificial knee joint: Secondary | ICD-10-CM | POA: Diagnosis not present

## 2022-01-19 DIAGNOSIS — M1711 Unilateral primary osteoarthritis, right knee: Secondary | ICD-10-CM | POA: Diagnosis not present

## 2022-01-20 ENCOUNTER — Telehealth: Payer: Self-pay | Admitting: Internal Medicine

## 2022-01-20 NOTE — Telephone Encounter (Signed)
For our records: We have received pre-op PW from [facility] and the forms have been placed in both of Burn's boxes.

## 2022-01-21 NOTE — Telephone Encounter (Signed)
Form has been placed in Dr. Quay Burow folder to review and sign.

## 2022-01-27 ENCOUNTER — Telehealth: Payer: Self-pay | Admitting: Internal Medicine

## 2022-01-27 DIAGNOSIS — I7121 Aneurysm of the ascending aorta, without rupture: Secondary | ICD-10-CM

## 2022-01-27 NOTE — Telephone Encounter (Signed)
Clearance faxed today and fax conformation received.

## 2022-01-27 NOTE — Telephone Encounter (Signed)
Spoke with patient today. 

## 2022-01-27 NOTE — Telephone Encounter (Signed)
Please call her and let her know I have ordered a CT of her chest-this is to follow-up on the enlarged aorta they saw on the CT scan approximately 1 year ago.  They will call her to schedule this.  Please remind her she needs to get into see endocrine.  Orthopedics is waiting for approval for her surgery and I need to let them know her sugars are not controlled and this is one of the requirements for surgery so she may not be able to have surgery until her sugars are better controlled and she needs to see endocrine.  I also ordered a referral for the cardiothoracic surgery group because they will be the ones that follow-up on the aneurysm and review her CT scan.  They will call her to schedule.

## 2022-01-29 DIAGNOSIS — J0111 Acute recurrent frontal sinusitis: Secondary | ICD-10-CM | POA: Diagnosis not present

## 2022-01-29 DIAGNOSIS — M545 Low back pain, unspecified: Secondary | ICD-10-CM | POA: Diagnosis not present

## 2022-01-29 DIAGNOSIS — M25552 Pain in left hip: Secondary | ICD-10-CM | POA: Diagnosis not present

## 2022-01-29 DIAGNOSIS — M25579 Pain in unspecified ankle and joints of unspecified foot: Secondary | ICD-10-CM | POA: Diagnosis not present

## 2022-01-29 DIAGNOSIS — G894 Chronic pain syndrome: Secondary | ICD-10-CM | POA: Diagnosis not present

## 2022-01-29 DIAGNOSIS — M170 Bilateral primary osteoarthritis of knee: Secondary | ICD-10-CM | POA: Diagnosis not present

## 2022-01-29 DIAGNOSIS — Z79891 Long term (current) use of opiate analgesic: Secondary | ICD-10-CM | POA: Diagnosis not present

## 2022-01-29 DIAGNOSIS — J0110 Acute frontal sinusitis, unspecified: Secondary | ICD-10-CM | POA: Diagnosis not present

## 2022-01-29 DIAGNOSIS — M5136 Other intervertebral disc degeneration, lumbar region: Secondary | ICD-10-CM | POA: Diagnosis not present

## 2022-01-29 DIAGNOSIS — M25562 Pain in left knee: Secondary | ICD-10-CM | POA: Diagnosis not present

## 2022-01-29 DIAGNOSIS — M25549 Pain in joints of unspecified hand: Secondary | ICD-10-CM | POA: Diagnosis not present

## 2022-01-29 DIAGNOSIS — M797 Fibromyalgia: Secondary | ICD-10-CM | POA: Diagnosis not present

## 2022-01-29 DIAGNOSIS — J3089 Other allergic rhinitis: Secondary | ICD-10-CM | POA: Diagnosis not present

## 2022-02-03 ENCOUNTER — Other Ambulatory Visit: Payer: Self-pay | Admitting: Internal Medicine

## 2022-02-03 ENCOUNTER — Telehealth: Payer: Self-pay | Admitting: Internal Medicine

## 2022-02-03 DIAGNOSIS — E1165 Type 2 diabetes mellitus with hyperglycemia: Secondary | ICD-10-CM

## 2022-02-03 NOTE — Telephone Encounter (Signed)
Ref ordered.

## 2022-02-03 NOTE — Telephone Encounter (Signed)
Patient would like to be referred to [place] for a diabetic doctor - Dr. Katina Degree, MD - [phone] (office number)

## 2022-02-07 ENCOUNTER — Other Ambulatory Visit: Payer: Self-pay | Admitting: Internal Medicine

## 2022-02-12 ENCOUNTER — Ambulatory Visit
Admission: RE | Admit: 2022-02-12 | Discharge: 2022-02-12 | Disposition: A | Discharge status: Home / Self Care | Payer: Medicaid Other | Source: Ambulatory Visit | Attending: Internal Medicine | Admitting: Internal Medicine

## 2022-02-12 DIAGNOSIS — I7121 Aneurysm of the ascending aorta, without rupture: Secondary | ICD-10-CM | POA: Diagnosis not present

## 2022-02-12 MED ORDER — IOPAMIDOL (ISOVUE-370) INJECTION 76%
75.0000 mL | Freq: Once | INTRAVENOUS | Status: AC | PRN
  Administered 2022-02-12: 75 mL via INTRAVENOUS

## 2022-02-24 NOTE — Progress Notes (Deleted)
[address]       [place], [address]             [phone]        Angeli Demilio [id number] 1960-04-30  History of Present Illness:  Tammy Strickland is a 61 yo morbidly obese AA female with history of Nicotine Abuse, Hyperlipidemia, Anxiety, Depression, DM with complications, HTN, CHF, H/O Stroke, Lupus, and COPD.  She was noted to have an aneurysm found when undergoing CTA Chest for PE.  Her PCP has sent her for repeat CT scan and surgical surveillance.       Current Outpatient Medications on File Prior to Visit  Medication Sig Dispense Refill   Accu-Chek Softclix Lancets lancets ONE EACH BY OTHER ROUTE FOUR TIMES DAILY. 100 each 2   albuterol (VENTOLIN HFA) 108 (90 Base) MCG/ACT inhaler Inhale 2 puffs into the lungs every 6 (six) hours as needed for wheezing or shortness of breath. 8 g 8   amitriptyline (ELAVIL) 75 MG tablet TAKE 1 TABLET BY MOUTH AT BEDTIME 30 tablet 11   ascorbic acid (VITAMIN C) 500 MG tablet Take 1 tablet (500 mg total) by mouth daily. 7 tablet 0   aspirin EC 81 MG tablet Take 1 tablet (81 mg total) by mouth daily.     atorvastatin (LIPITOR) 20 MG tablet Take 1 tablet by mouth every day 30 tablet 11   busPIRone (BUSPAR) 5 MG tablet TAKE 1 TABLET(5 MG) BY MOUTH THREE TIMES DAILY 90 tablet 1   Cholecalciferol (VITAMIN D3) 50 MCG (2000 UT) capsule Take 1 capsule (2,000 Units total) by mouth daily. 90 capsule 1   cloNIDine (CATAPRES) 0.3 MG tablet Take 1 tablet by mouth 3 times a day 90 tablet 11   cyclobenzaprine (FEXMID) 7.5 MG tablet Take 7.5 mg by mouth 3 (three) times daily.     diclofenac (CATAFLAM) 50 MG tablet Take by mouth.     DULoxetine (CYMBALTA) 60 MG capsule Take 60 mg by mouth daily.     fluticasone (FLONASE) 50 MCG/ACT nasal spray Shake liquid & use 1 spray into each nostril every day as needed for allergies 16 g 11   fluticasone furoate-vilanterol (BREO ELLIPTA) 100-25 MCG/INH AEPB Inhale 1 puff into the lungs daily. 1  each 11   furosemide (LASIX) 80 MG tablet Take 1 tablet by mouth every day 30 tablet 11   gabapentin (NEURONTIN) 600 MG tablet Take 2 tablets (1,200 mg total) by mouth 2 (two) times daily. 360 tablet 0   glucose blood (ACCU-CHEK GUIDE) test strip TEST TWICE DAILY 200 strip 2   hydrOXYzine (ATARAX/VISTARIL) 25 MG tablet Take 25 mg by mouth at bedtime.     ibuprofen (ADVIL) 600 MG tablet Take 1 tablet (600 mg total) by mouth 2 (two) times daily as needed for moderate pain. 60 tablet 3   insulin glargine, 2 Unit Dial, (TOUJEO MAX SOLOSTAR) 300 UNIT/ML Solostar Pen Inject 180 Units into the skin every morning. And pen needles 1/day (Patient taking differently: Inject 160 Units into the skin every evening. And pen needles 1/day) 60 mL 3   Insulin Pen Needle (PEN NEEDLES) 32G X 5 MM MISC UAD daily for insulin 90 each 3   Iron, Ferrous Sulfate, 325 (65 Fe) MG TABS Take 325 mg by mouth daily. 90 tablet 1   KLOR-CON M10 10 MEQ tablet Take 2 tablets by mouth every day 60 tablet 11   losartan (COZAAR) 50 MG tablet Take 1 tablet by  mouth twice daily 60 tablet 11   metoprolol (TOPROL-XL) 200 MG 24 hr tablet Take 1 tablet by mouth twice daily 60 tablet 11   NARCAN 4 MG/0.1ML LIQD nasal spray kit Place 1 spray into the nose once.     nitroGLYCERIN (NITROSTAT) 0.4 MG SL tablet Place 0.4 mg under the tongue every 5 (five) minutes as needed for chest pain.     ondansetron (ZOFRAN) 4 MG tablet Take 4 mg by mouth every 8 (eight) hours as needed.     oxyCODONE-acetaminophen (PERCOCET) 10-325 MG tablet Take 1 tablet by mouth every 6 (six) hours.     pantoprazole (PROTONIX) 40 MG tablet Take 1 tablet by mouth twice daily 60 tablet 11   SYMBICORT 80-4.5 MCG/ACT inhaler Inhale 2 puffs into the lungs daily as needed 10.2 each 11   tiZANidine (ZANAFLEX) 4 MG tablet Take 1 tablet by mouth at bedtime 30 tablet 5   ZTLIDO 1.8 % PTCH Apply 1 patch topically daily as needed (knee pain).     No current facility-administered  medications on file prior to visit.     There were no vitals taken for this visit.  Physical Exam    CTA Results:  FINDINGS: Cardiovascular: 4.1 cm ascending thoracic aortic aneurysm. No evidence of dissection.   The heart is unremarkable without pericardial effusion.   Mediastinum/Nodes: Right lobe thyroid nodule unchanged since prior examination, and has been previously evaluated by ultrasound. Trachea and esophagus are unremarkable. 1.3 cm right hilar lymph node, previously 1.5 cm. No other pathologic adenopathy.   Lungs/Pleura: No acute airspace disease, effusion, or pneumothorax. Mild bullous emphysematous changes at the lung apices. Central airways are patent.   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 No change since 2021. No acute upper abdominal findings.   Musculoskeletal: No acute or destructive bony lesions. Reconstructed images demonstrate no additional findings.   Review of the MIP images confirms the above findings.   IMPRESSION: 1. 4.1 cm ascending thoracic aortic aneurysm, not appreciably different than previous study. Recommend annual imaging followup by CTA or MRA. This recommendation follows 2010 ACCF/AHA/AATS/ACR/ASA/SCA/SCAI/SIR/STS/SVM Guidelines for the Diagnosis and Management of Patients with Thoracic Aortic Disease. Circulation. 2010; 121: C488-Q916. Aortic aneurysm NOS (ICD10-I71.9) 2. No acute intrathoracic process. 3.  Emphysema (ICD10-J43.9).     Electronically Signed   By: Randa Ngo M.D.   On: 02/12/2022 13:37    A/P:      Risk Modification:  Statin:  Yes  Smoking cessation instruction/counseling given:  {CHL AMB [facility] SMOKING CESSATION COUNSELING:20758}  Patient was counseled on importance of Blood Pressure Control.  Despite Medical intervention if the patient notices persistently elevated blood pressure readings.  They are instructed to contact their Primary  Care Physician  Please avoid use of Fluoroquinolones as this can potentially increase your risk of Aortic Rupture and/or Dissection  Patient educated on signs and symptoms of Aortic Dissection, handout also provided in AVS  Ayesha Markwell, PA-C 02/24/22

## 2022-02-24 NOTE — Patient Instructions (Incomplete)
Make every effort to maintain a "heart-healthy" lifestyle with regular physical exercise and adherence to a low-fat, low-carbohydrate diet.  Continue to seek regular follow-up appointments with your primary care physician and/or cardiologist.  AVOID FLUOROQUINOLONES AS THESE CAN RAISE YOUR RISK OF AORTIC DISSECTION  Stop smoking immediately and permanently.

## 2022-02-26 DIAGNOSIS — G43909 Migraine, unspecified, not intractable, without status migrainosus: Secondary | ICD-10-CM | POA: Diagnosis not present

## 2022-02-26 DIAGNOSIS — M545 Low back pain, unspecified: Secondary | ICD-10-CM | POA: Diagnosis not present

## 2022-02-26 DIAGNOSIS — Z79891 Long term (current) use of opiate analgesic: Secondary | ICD-10-CM | POA: Diagnosis not present

## 2022-02-26 DIAGNOSIS — M797 Fibromyalgia: Secondary | ICD-10-CM | POA: Diagnosis not present

## 2022-02-26 DIAGNOSIS — M25549 Pain in joints of unspecified hand: Secondary | ICD-10-CM | POA: Diagnosis not present

## 2022-02-26 DIAGNOSIS — M170 Bilateral primary osteoarthritis of knee: Secondary | ICD-10-CM | POA: Diagnosis not present

## 2022-02-26 DIAGNOSIS — J3089 Other allergic rhinitis: Secondary | ICD-10-CM | POA: Diagnosis not present

## 2022-02-26 DIAGNOSIS — M79604 Pain in right leg: Secondary | ICD-10-CM | POA: Diagnosis not present

## 2022-02-26 DIAGNOSIS — M25562 Pain in left knee: Secondary | ICD-10-CM | POA: Diagnosis not present

## 2022-02-26 DIAGNOSIS — M25552 Pain in left hip: Secondary | ICD-10-CM | POA: Diagnosis not present

## 2022-02-26 DIAGNOSIS — G894 Chronic pain syndrome: Secondary | ICD-10-CM | POA: Diagnosis not present

## 2022-02-26 DIAGNOSIS — M25579 Pain in unspecified ankle and joints of unspecified foot: Secondary | ICD-10-CM | POA: Diagnosis not present

## 2022-02-26 DIAGNOSIS — M5136 Other intervertebral disc degeneration, lumbar region: Secondary | ICD-10-CM | POA: Diagnosis not present

## 2022-03-04 ENCOUNTER — Institutional Professional Consult (permissible substitution) (INDEPENDENT_AMBULATORY_CARE_PROVIDER_SITE_OTHER): Payer: Medicare Other | Admitting: Physician Assistant

## 2022-03-04 VITALS — BP 146/81 | HR 57 | Resp 20 | Ht 66.0 in | Wt 287.0 lb

## 2022-03-04 DIAGNOSIS — I7121 Aneurysm of the ascending aorta, without rupture: Secondary | ICD-10-CM | POA: Diagnosis not present

## 2022-03-04 NOTE — Patient Instructions (Signed)
Avoid repetitive strenuous exercise or lifting greater than 40 pounds  Continue to keep close watch on your blood pressure optimize control with a goal of blood pressure less than 130/90.  Avoid the fluoroquinolone class of antibiotics that include Cipro and Levaquin as these agents have been shown to weaken aortic tissue.  Continue atorvastatin (Lipitor) or similar medication to optimize lipid control.  Smoking cessation and weight loss are strongly encouraged.

## 2022-03-04 NOTE — Progress Notes (Signed)
[address]       [place] [address]             [phone]      PCP is Binnie Rail, MD Referring Provider is Binnie Rail, MD  Chief Complaint  Patient presents with   Thoracic Aortic Aneurysm    Chest CT 11/2    HPI: Ms. Tammy Strickland is a 61 year old female with past medical history significant for poorly controlled diabetes with hemoglobin A1c historically ranging from 9-11 ongoing tobacco use with COPD and asthma, dyslipidemia, fibromyalgia with chronic pain syndrome managed by the pain clinic, and degenerative joint disease status post left total knee replacement.  She was seen in the emergency room in May of last year for evaluation of chest pain with associated shortness of breath.  CTA of the chest was obtained showing no evidence of PE but she did have a 4.4 cm thoracic aortic aneurysm.  Follow-up CTA by her PCP, Dr. Billey Gosling, earlier this month.  The ascending aorta at 4.1 cm.  An echocardiogram obtained about 5 years ago shows a trileaflet aortic valve with no structural or functional abnormalities.  Incidentally, she was also noted at that time to have patient of the aortic root. Ms. Kerchner was referred to [place] for evaluation and ongoing surveillance of the thoracic aortic aneurysm.   Past Medical History:  Diagnosis Date   Ambulates with cane 03/13/2021   Anxiety    Arthritis    Asthma    CHF (congestive heart failure) (HCC)    COPD (chronic obstructive pulmonary disease) ([place])    Coronary artery disease    Depression    Diabetes mellitus without complication ([place])    type 2    Fibromyalgia    Gallstones    GERD (gastroesophageal reflux disease)    HLD (hyperlipidemia)    Hypertension    Lupus (HCC)    Migraines    Myocardial infarction ([place]) 2017   Neuromuscular disorder ([place])    Osteoporosis    Pancreatitis 05/27/2019   none since   Peripheral neuropathy 03/13/2021   all over pt pt   Shingles    Sleep apnea    sleep  study 03-03-2017 mild osa no cpap used per pt   Stroke ([place]) 10/2015   no residual from   Uses walker 03/13/2021   Wears glasses     Past Surgical History:  Procedure Laterality Date   ABDOMINAL HYSTERECTOMY  1989   ovaries left   APPENDECTOMY  1989   BREAST LUMPECTOMY WITH RADIOACTIVE SEED LOCALIZATION Left 06/14/2019   Procedure: LEFT BREAST LUMPECTOMY WITH RADIOACTIVE SEED LOCALIZATION X 2;  Surgeon: Coralie Keens, MD;  Location: [facility];  Service: General;  Laterality: Left;  LMA   CHOLECYSTECTOMY  1989   IR GENERIC HISTORICAL  11/07/2015   IR ANGIO INTRA EXTRACRAN SEL COM CAROTID INNOMINATE BILAT MOD SED 11/07/2015 Luanne Bras, MD [facility]   IR GENERIC HISTORICAL  11/07/2015   IR ANGIO VERTEBRAL SEL SUBCLAVIAN INNOMINATE UNI R MOD SED 11/07/2015 Luanne Bras, MD [facility]   IR GENERIC HISTORICAL  11/07/2015   IR ANGIO VERTEBRAL SEL VERTEBRAL UNI L MOD SED 11/07/2015 Luanne Bras, MD [facility]   IR GENERIC HISTORICAL  11/07/2015   IR ANGIOGRAM EXTREMITY LEFT 11/07/2015 Luanne Bras, MD [facility]   MASS EXCISION Left 03/17/2021   Procedure: EXCISION MASS,left middle finger ulnar border mass excision,left thumb volar retinacular ganglion cyst excision;  Surgeon:  Orene Desanctis, MD;  Location: [facility];  Service: Orthopedics;  Laterality: Left;   TONSILLECTOMY  2000   adenoids removed   TOTAL KNEE ARTHROPLASTY Left 03/12/2017   Procedure: LEFT TOTAL KNEE ARTHROPLASTY;  Surgeon: Mcarthur Rossetti, MD;  Location: [facility];  Service: Orthopedics;  Laterality: Left;  Adductor Block   TRIGGER FINGER RELEASE Left 03/17/2021   Procedure: left trigger thumb release;  Surgeon: Orene Desanctis, MD;  Location[facility];  Service: Orthopedics;  Laterality: Left;  with local anesthesia    Family History  Problem Relation Age of Onset   Hyperlipidemia Mother    Hypertension Mother    Stroke Mother    Thyroid disease  Mother    Heart attack Mother    Hyperlipidemia Father    Hypertension Father    Stroke Father    Heart attack Father    Hypertension Sister    Stroke Sister    Thyroid disease Sister    Breast cancer Sister    Crohn's disease Sister    Hypertension Sister    Hypertension Brother    Diabetes Brother    Hypertension Brother     Social History Social History   Tobacco Use   Smoking status: Every Day    Packs/day: 0.50    Years: 35.00    Total pack years: 17.50    Types: Cigarettes   Smokeless tobacco: Never   Tobacco comments:    referred  to smoking  cessation  classes at  [employer] Use   Vaping Use: Never used  Substance Use Topics   Alcohol use: Yes    Comment: occasional   Drug use: No    Current Outpatient Medications  Medication Sig Dispense Refill   Accu-Chek Softclix Lancets lancets ONE EACH BY OTHER ROUTE FOUR TIMES DAILY. 100 each 2   albuterol (VENTOLIN HFA) 108 (90 Base) MCG/ACT inhaler Inhale 2 puffs into the lungs every 6 (six) hours as needed for wheezing or shortness of breath. 8 g 8   amitriptyline (ELAVIL) 75 MG tablet TAKE 1 TABLET BY MOUTH AT BEDTIME 30 tablet 11   ascorbic acid (VITAMIN C) 500 MG tablet Take 1 tablet (500 mg total) by mouth daily. 7 tablet 0   aspirin EC 81 MG tablet Take 1 tablet (81 mg total) by mouth daily.     atorvastatin (LIPITOR) 20 MG tablet Take 1 tablet by mouth every day 30 tablet 11   busPIRone (BUSPAR) 5 MG tablet TAKE 1 TABLET(5 MG) BY MOUTH THREE TIMES DAILY 90 tablet 1   Cholecalciferol (VITAMIN D3) 50 MCG (2000 UT) capsule Take 1 capsule (2,000 Units total) by mouth daily. 90 capsule 1   cloNIDine (CATAPRES) 0.3 MG tablet Take 1 tablet by mouth 3 times a day 90 tablet 11   cyclobenzaprine (FEXMID) 7.5 MG tablet Take 7.5 mg by mouth 3 (three) times daily.     diclofenac (CATAFLAM) 50 MG tablet Take by mouth.     DULoxetine (CYMBALTA) 60 MG capsule Take 60 mg by mouth daily.     fluticasone (FLONASE) 50 MCG/ACT  nasal spray Shake liquid & use 1 spray into each nostril every day as needed for allergies 16 g 11   fluticasone furoate-vilanterol (BREO ELLIPTA) 100-25 MCG/INH AEPB Inhale 1 puff into the lungs daily. 1 each 11   furosemide (LASIX) 80 MG tablet Take 1 tablet by mouth every day 30 tablet 11   gabapentin (NEURONTIN) 600 MG tablet Take 2  tablets (1,200 mg total) by mouth 2 (two) times daily. 360 tablet 0   glucose blood (ACCU-CHEK GUIDE) test strip TEST TWICE DAILY 200 strip 2   hydrOXYzine (ATARAX/VISTARIL) 25 MG tablet Take 25 mg by mouth at bedtime.     ibuprofen (ADVIL) 600 MG tablet Take 1 tablet (600 mg total) by mouth 2 (two) times daily as needed for moderate pain. 60 tablet 3   insulin glargine, 2 Unit Dial, (TOUJEO MAX SOLOSTAR) 300 UNIT/ML Solostar Pen Inject 180 Units into the skin every morning. And pen needles 1/day (Patient taking differently: Inject 160 Units into the skin every evening. And pen needles 1/day) 60 mL 3   Insulin Pen Needle (PEN NEEDLES) 32G X 5 MM MISC UAD daily for insulin 90 each 3   Iron, Ferrous Sulfate, 325 (65 Fe) MG TABS Take 325 mg by mouth daily. 90 tablet 1   KLOR-CON M10 10 MEQ tablet Take 2 tablets by mouth every day 60 tablet 11   losartan (COZAAR) 50 MG tablet Take 1 tablet by mouth twice daily 60 tablet 11   metoprolol (TOPROL-XL) 200 MG 24 hr tablet Take 1 tablet by mouth twice daily 60 tablet 11   NARCAN 4 MG/0.1ML LIQD nasal spray kit Place 1 spray into the nose once.     nitroGLYCERIN (NITROSTAT) 0.4 MG SL tablet Place 0.4 mg under the tongue every 5 (five) minutes as needed for chest pain.     ondansetron (ZOFRAN) 4 MG tablet Take 4 mg by mouth every 8 (eight) hours as needed.     oxyCODONE-acetaminophen (PERCOCET) 10-325 MG tablet Take 1 tablet by mouth every 6 (six) hours.     pantoprazole (PROTONIX) 40 MG tablet Take 1 tablet by mouth twice daily 60 tablet 11   SYMBICORT 80-4.5 MCG/ACT inhaler Inhale 2 puffs into the lungs daily as needed 10.2  each 11   tiZANidine (ZANAFLEX) 4 MG tablet Take 1 tablet by mouth at bedtime 30 tablet 5   ZTLIDO 1.8 % PTCH Apply 1 patch topically daily as needed (knee pain).     No current facility-administered medications for this visit.    Allergies  Allergen Reactions   Metformin And Related Other (See Comments)    Lactic acidosis diarrhea hives   Other Other (See Comments)    H/o pancreatitis   Ozempic (0.25 Or 0.5 Mg-Dose) [Semaglutide(0.25 Or 0.52m-Dos)]     H/o pancreatitis   Bacitracin Hives    Review of Systems: Review of Systems  Constitutional: Negative.   Respiratory:  Positive for cough and wheezing.        Sleep apnea  Gastrointestinal:  Positive for heartburn.       Gastroesophageal reflux disease  Musculoskeletal:  Positive for joint pain and myalgias.  Skin:  Positive for itching.  Neurological:        History of "mini strokes" x2 Numbness in hands and feet  Endo/Heme/Allergies:        Type 2 diabetes mellitus     Resp 20   Ht _0  (1.676 m)   Wt 287 lb (130.2 kg)   BMI 46.32 kg/m  Physical Exam: Vital signs BP 146/81 Pulse 57 Respirations 20 SPO2 96% on room air General: Pleasant 61year old female in no acute distress.  She does tell me that she pain in her knees after walking into the office chronic.  She hopes to get her left knee replacement revised soon she "gets her sugars under control".  She also anticipates having a right  knee replacement in the near future as well. Head: Normocephalic and atraumatic.  Neck: Supple, no carotid bruit Chest: Breath sounds are full, equal, and clear to auscultation. Heart: Regular rate and rhythm, soft, grade 2/6 systolic murmur heard at the left sternal border. Extremities: Obvious deformities.  Is a well-healed scar across the left knee.  All extremities are well-perfused with palpable pulses. Neuro: Grossly nonfocal   Diagnostic Tests: CLINICAL DATA:  PE suspected, high prob   Cough and fever.  Leg pain and  swelling.   EXAM: CT ANGIOGRAPHY CHEST WITH CONTRAST  08/31/21   TECHNIQUE: Multidetector CT imaging of the chest was performed using the standard protocol during bolus administration of intravenous contrast. Multiplanar CT image reconstructions and MIPs were obtained to evaluate the vascular anatomy.   CONTRAST:  37m OMNIPAQUE IOHEXOL 350 MG/ML SOLN   COMPARISON:  Radiograph earlier today.   FINDINGS: Cardiovascular: There are no filling defects within the pulmonary arteries to suggest pulmonary embolus. Fusiform aneurysmal dilatation of the ascending aorta maximal dimension 4.4 cm. No evidence of acute aortic findings. Minimal aortic atherosclerosis. Conventional branching pattern from the aortic arch. Borderline cardiomegaly. No pericardial effusion.   Mediastinum/Nodes: Prominent right hilar lymph nodes, largest measuring 15 mm. There are small left hilar lymph nodes that are subcentimeter short axis. 9 mm AP window node. Patulous esophagus without wall thickening. Enlarged right lobe of the thyroid gland with question of 2.7 cm nodule. No axillary adenopathy.   Lungs/Pleura: Mild apical predominant emphysema. Central bronchial thickening. Breathing motion artifact limits detailed parenchymal assessment. No focal consolidation. No pulmonary mass or suspicious nodule. No pleural fluid.   Upper Abdomen: No acute findings.  Cholecystectomy.   Musculoskeletal: Diffuse thoracic spondylosis with endplate spurring. There are no acute or suspicious osseous abnormalities.   Review of the MIP images confirms the above findings.   IMPRESSION: 1. No pulmonary embolus. 2. Fusiform aneurysmal dilatation of the ascending aorta maximal dimension 4.4 cm. No acute aortic findings. Recommend annual imaging followup by CTA or MRA. This recommendation follows 2010 ACCF/AHA/AATS/ACR/ASA/SCA/SCAI/SIR/STS/SVM Guidelines for the Diagnosis and Management of Patients with Thoracic Aortic  Disease. Circulation. 2010; 121:: U202-R427 Aortic aneurysm NOS (ICD10-I71.9) 3. Prominent right hilar and AP window lymph nodes are likely reactive. 4. Mild emphysema and bronchial thickening. No focal airspace disease. 5. Enlarged right lobe of the thyroid gland with question of 2.7 cm nodule. Recommend thyroid [place](ref: J Am Coll Radiol. 2015 Feb;12(2): 143-50).   Aortic Atherosclerosis (ICD10-I70.0) and Emphysema (ICD10-J43.9).     Electronically Signed   By: MKeith RakeM.D.   On: 09/01/2020 00:10    CLINICAL DATA:  Aneurysmal dilation of the ascending thoracic aorta   EXAM: CT ANGIOGRAPHY CHEST WITH CONTRAST   02/12/2022   TECHNIQUE: Multidetector CT imaging of the chest was performed using the standard protocol during bolus administration of intravenous contrast. Multiplanar CT image reconstructions and MIPs were obtained to evaluate the vascular anatomy.   RADIATION DOSE REDUCTION: This exam was performed according to the departmental dose-optimization program which includes automated exposure control, adjustment of the mA and/or kV according to patient size and/or use of iterative reconstruction technique.   CONTRAST:  73mISOVUE-370 IOPAMIDOL (ISOVUE-370) INJECTION 76%   Creatinine was obtained on site at [facility] [address]   Results: Creatinine 1.0 mg/dL.   COMPARISON:  08/31/2020   FINDINGS: Cardiovascular: 4.1 cm ascending thoracic aortic aneurysm. No evidence of dissection.   The heart is unremarkable without pericardial effusion.  Mediastinum/Nodes: Right lobe thyroid nodule unchanged since prior examination, and has been previously evaluated by ultrasound. Trachea and esophagus are unremarkable. 1.3 cm right hilar lymph node, previously 1.5 cm. No other pathologic adenopathy.   Lungs/Pleura: No acute airspace disease, effusion, or pneumothorax. Mild bullous emphysematous changes at the lung apices. Central airways  are patent.   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 No change since 2021. No acute upper abdominal findings.   Musculoskeletal: No acute or destructive bony lesions. Reconstructed images demonstrate no additional findings.   Review of the MIP images confirms the above findings.   IMPRESSION: 1. 4.1 cm ascending thoracic aortic aneurysm, not appreciably different than previous study. Recommend annual imaging followup by CTA or MRA. This recommendation follows 2010 ACCF/AHA/AATS/ACR/ASA/SCA/SCAI/SIR/STS/SVM Guidelines for the Diagnosis and Management of Patients with Thoracic Aortic Disease. Circulation. 2010; 121: T056-P794. Aortic aneurysm NOS (ICD10-I71.9) 2. No acute intrathoracic process. 3.  Emphysema (ICD10-J43.9).     Electronically Signed   By: Randa Ngo M.D.   On: 02/12/2022 13:37   *[place], [place] [address]                             [phone]   -------------------------------------------------------------------  Transthoracic Echocardiography   Patient:    Sunshine, Mackowski  MR #:       [id number]  Study Date: 11/01/2015  Gender:     F  Age:        7  Height:     170.2 cm  Weight:     136.4 kg  BSA:        2.61 m^2  Pt. Status:  Room:       2W01C    SONOGRAPHER  Tresa Res, RDCS   ADMITTING    Elgergawy, Dawood S   ORDERING     Elgergawy, Dawood S   REFERRING    Elgergawy, Dawood S   ATTENDING    [facility], [facility]   PERFORMING   [facility], Inpatient   cc:   -------------------------------------------------------------------  LV EF: 55% -   60%   -------------------------------------------------------------------  Indications:     Chest pain 786.51.   -------------------------------------------------------------------  History:  PMH:   Chronic obstructive  pulmonary disease.  Risk  factors:  Current tobacco use. Hypertension. Diabetes mellitus.   -------------------------------------------------------------------  Study Conclusions   - Left ventricle: The cavity size was normal. Wall thickness was    increased in a pattern of mild LVH. Systolic function was normal.    The estimated ejection fraction was in the range of 55% to 60%.    Wall motion was normal; there were no regional wall motion    abnormalities. Doppler parameters are consistent with abnormal    left ventricular relaxation (grade 1 diastolic dysfunction).  - Aortic root: The aortic root was mildly dilated.  - Mitral valve: Calcified annulus.   Impressions:   - Technically difficult; definity used; normal LV systolic    function; mild LVH;  grade 1 diastolic dysfunction.   -------------------------------------------------------------------  Study data:   Study status:  Routine.  Procedure:  Transthoracic  echocardiography. The study was technically difficult, as a result  of poor acoustic windows, poor sound wave transmission, and body  habitus. Intravenous contrast (Definity) was administered.  Study  completion:  There were no complications.          Transthoracic  echocardiography.  M-mode, complete 2D, spectral Doppler, and color  Doppler.  Birthdate:  Patient birthdate: 1960-06-10.  Age:  Patient  is 61 yr old.  Sex:  Gender: female.    BMI: 47.1 kg/m^2.  Blood  pressure:     141/79  Patient status:  Inpatient.  Study date:  Study date: 11/01/2015. Study time: 02:26 PM.  [facility]  [facility].   -------------------------------------------------------------------   -------------------------------------------------------------------  Left ventricle:  The cavity size was normal. Wall thickness was  increased in a pattern of mild LVH. Systolic function was normal.  The estimated ejection fraction was in the range of 55% to 60%.  Wall motion was normal; there  were no regional wall motion  abnormalities. Doppler parameters are consistent with abnormal left  ventricular relaxation (grade 1 diastolic dysfunction).   -------------------------------------------------------------------  Aortic valve:   Trileaflet; mildly thickened leaflets. Mobility was  not restricted.  Doppler:  Transvalvular velocity was within the  normal range. There was no stenosis. There was no regurgitation.    -------------------------------------------------------------------  Aorta: Aortic root: The aortic root was mildly dilated.   -------------------------------------------------------------------  Mitral valve:   Calcified annulus. Mobility was not restricted.  Doppler:  Transvalvular velocity was within the normal range. There  was no evidence for stenosis. There was no regurgitation.   -------------------------------------------------------------------  Left atrium:  The atrium was normal in size.   -------------------------------------------------------------------  Right ventricle:  Poorly visualized. The cavity size was normal.  Systolic function was normal.   -------------------------------------------------------------------  Pulmonic valve:    Doppler:  Transvalvular velocity was within the  normal range. There was no evidence for stenosis.   -------------------------------------------------------------------  Tricuspid valve:  Poorly visualized.  Structurally normal valve.  Doppler:  Transvalvular velocity was within the normal range. There  was trivial regurgitation.   -------------------------------------------------------------------  Right atrium:  Poorly visualized. The atrium was normal in size.    -------------------------------------------------------------------  Pericardium: There was no pericardial effusion.   -------------------------------------------------------------------  Measurements    Left ventricle                            Value        Reference   LV ID, ED, PLAX chordal                  45    mm     43 - 52   LV ID, ES, PLAX chordal                  34    mm     23 - 38   LV fx shortening, PLAX chordal (L)       24    %      >=29   LV PW thickness, ED                      13    mm     ---------   IVS/LV PW ratio, ED  0.85         <=1.3   Stroke volume, 2D                        77    ml     ---------   Stroke volume/bsa, 2D                    29    ml/m^2 ---------   LV e&', lateral                           8.49  cm/s   ---------   LV E/e&', lateral                         6.91         ---------   LV e&', medial                            8.7   cm/s   ---------   LV E/e&', medial                          6.75         ---------   LV e&', average                           8.6   cm/s   ---------   LV E/e&', average                         6.83         ---------     Ventricular septum                       Value        Reference   IVS thickness, ED                        11    mm     ---------     LVOT                                     Value        Reference   LVOT ID, S                               21    mm     ---------   LVOT area                                3.46  cm^2   ---------   LVOT peak velocity, S                    115   cm/s   ---------   LVOT mean velocity, S                    80.2  cm/s   ---------   LVOT VTI, S  22.3  cm     ---------   LVOT peak gradient, S                    5     mm Hg  ---------     Aorta                                    Value        Reference   Aortic root ID, ED                       38    mm     ---------     Left atrium                              Value        Reference   LA ID, A-P, ES                           34    mm     ---------   LA ID/bsa, A-P                           1.3   cm/m^2 <=2.2   LA volume, ES, 1-p A4C                   35.8  ml     ---------   LA volume/bsa, ES, 1-p A4C               13.7   ml/m^2 ---------     Mitral valve                             Value        Reference   Mitral E-wave peak velocity              58.7  cm/s   ---------   Mitral A-wave peak velocity              77.1  cm/s   ---------   Mitral deceleration time       (H)       324   ms     150 - 230   Mitral E/A ratio, peak                   0.76         ---------   Legend:  (L) and  (H)  mark values outside specified reference range.   -------------------------------------------------------------------  Prepared and Electronically Authenticated by   Kirk Ruths  2017-07-21T15:26:52   Impression / Plan: Pleasant 61 year old female with the above described past medical history was incidentally found to have thoracic aortic aneurysm last year while being evaluated for potential PE in the emergency room.  He has no known family history of connective tissue disorders or aneurysms.  She denies having any chest pain consistent with an enlarging aneurysm.  Repeat CTA obtained earlier this month by her PCP measures the thoracic aortic aneurysm at 4.1 cm, decreased from 4.4 cm as it was initially measured in 2022.   Continued surveillance is recommended with annual imaging.  We also discussed the importance  of careful blood pressure control, the importance of smoking cessation, lipid management, and avoiding repetitive strenuous activities.  She is also advised to avoid quinolone antibiotics as these have been shown to weaken vascular connective tissue. She affirms that she is committed to smoking cessation saying she had planned to stop smoking when she enters the hospital for knee surgery.   Antony Odea, PA-C [facility] [phone]

## 2022-03-12 ENCOUNTER — Encounter: Payer: Self-pay | Admitting: Internal Medicine

## 2022-03-12 NOTE — Progress Notes (Signed)
Subjective:    Patient ID: Tammy Strickland, female    DOB: 12-Sep-1960, 61 y.o.   MRN: [id number]      HPI Tammy Strickland is here for  Chief Complaint  Patient presents with   Follow-up    Wants A1C checked for surgery (must be under 7.5)   Hand Pain    Right hand pain and swelling    To have revision of left total knee replacement.  She needs her A1c to be 7.8% or less.  She wants to have her blood work done today.  She states her sugars have been good-sugar was 99 today.  She did eat 2 sausage macrogols on the way here today.  She thinks she saw endocrine recently-last month.  She is not sure-she thinks it was related to her sugars.  She is not sure who the doctor was.  He is currently taking 160 units of insulin twice daily  She has persistent dry skin on her hands.  She is religious about putting Vaseline on after every time she washes her hands.  She still gets areas of dryness and thickened skin.     Medications and allergies reviewed with patient and updated if appropriate.  Current Outpatient Medications on File Prior to Visit  Medication Sig Dispense Refill   Accu-Chek Softclix Lancets lancets ONE EACH BY OTHER ROUTE FOUR TIMES DAILY. 100 each 2   albuterol (VENTOLIN HFA) 108 (90 Base) MCG/ACT inhaler Inhale 2 puffs into the lungs every 6 (six) hours as needed for wheezing or shortness of breath. 8 g 8   amitriptyline (ELAVIL) 75 MG tablet TAKE 1 TABLET BY MOUTH AT BEDTIME 30 tablet 11   ascorbic acid (VITAMIN C) 500 MG tablet Take 1 tablet (500 mg total) by mouth daily. 7 tablet 0   aspirin EC 81 MG tablet Take 1 tablet (81 mg total) by mouth daily.     atorvastatin (LIPITOR) 20 MG tablet Take 1 tablet by mouth every day 30 tablet 11   busPIRone (BUSPAR) 5 MG tablet TAKE 1 TABLET(5 MG) BY MOUTH THREE TIMES DAILY 90 tablet 1   Cholecalciferol (VITAMIN D3) 50 MCG (2000 UT) capsule Take 1 capsule (2,000 Units total) by mouth daily. 90 capsule 1   cloNIDine (CATAPRES) 0.3 MG  tablet Take 1 tablet by mouth 3 times a day 90 tablet 11   cyclobenzaprine (FEXMID) 7.5 MG tablet Take 7.5 mg by mouth 3 (three) times daily.     diclofenac (CATAFLAM) 50 MG tablet Take by mouth.     DULoxetine (CYMBALTA) 60 MG capsule Take 60 mg by mouth daily.     fluticasone (FLONASE) 50 MCG/ACT nasal spray Shake liquid & use 1 spray into each nostril every day as needed for allergies 16 g 11   fluticasone furoate-vilanterol (BREO ELLIPTA) 100-25 MCG/INH AEPB Inhale 1 puff into the lungs daily. 1 each 11   furosemide (LASIX) 80 MG tablet Take 1 tablet by mouth every day 30 tablet 11   glucose blood (ACCU-CHEK GUIDE) test strip TEST TWICE DAILY 200 strip 2   hydrOXYzine (ATARAX/VISTARIL) 25 MG tablet Take 25 mg by mouth at bedtime.     insulin glargine, 2 Unit Dial, (TOUJEO MAX SOLOSTAR) 300 UNIT/ML Solostar Pen Inject 180 Units into the skin every morning. And pen needles 1/day (Patient taking differently: Inject 160 Units into the skin every evening. And pen needles 1/day) 60 mL 3   Insulin Pen Needle (PEN NEEDLES) 32G X 5 MM MISC UAD daily for  insulin 90 each 3   Iron, Ferrous Sulfate, 325 (65 Fe) MG TABS Take 325 mg by mouth daily. 90 tablet 1   KLOR-CON M10 10 MEQ tablet Take 2 tablets by mouth every day 60 tablet 11   losartan (COZAAR) 50 MG tablet Take 1 tablet by mouth twice daily 60 tablet 11   metoprolol (TOPROL-XL) 200 MG 24 hr tablet Take 1 tablet by mouth twice daily 60 tablet 11   NARCAN 4 MG/0.1ML LIQD nasal spray kit Place 1 spray into the nose once.     nitroGLYCERIN (NITROSTAT) 0.4 MG SL tablet Place 0.4 mg under the tongue every 5 (five) minutes as needed for chest pain.     ondansetron (ZOFRAN) 4 MG tablet Take 4 mg by mouth every 8 (eight) hours as needed.     oxyCODONE-acetaminophen (PERCOCET) 10-325 MG tablet Take 1 tablet by mouth every 6 (six) hours.     pantoprazole (PROTONIX) 40 MG tablet Take 1 tablet by mouth twice daily 60 tablet 11   SYMBICORT 80-4.5 MCG/ACT  inhaler Inhale 2 puffs into the lungs daily as needed 10.2 each 11   tiZANidine (ZANAFLEX) 4 MG tablet Take 1 tablet by mouth at bedtime 30 tablet 5   ZTLIDO 1.8 % PTCH Apply 1 patch topically daily as needed (knee pain).     No current facility-administered medications on file prior to visit.    Review of Systems  Constitutional:  Negative for fever.  Respiratory:  Positive for wheezing (occ). Negative for cough and shortness of breath.   Cardiovascular:  Negative for chest pain, palpitations and leg swelling.  Neurological:  Negative for light-headedness and headaches.       Objective:   Vitals:   03/13/22 0750  BP: 136/82  Pulse: 80  Temp: 98.4 F (36.9 C)  SpO2: 95%   BP Readings from Last 3 Encounters:  03/13/22 136/82  03/04/22 (!) 146/81  12/03/21 134/82   Wt Readings from Last 3 Encounters:  03/13/22 287 lb (130.2 kg)  03/04/22 287 lb (130.2 kg)  12/03/21 296 lb (134.3 kg)   Body mass index is 46.32 kg/m.    Physical Exam Constitutional:      General: She is not in acute distress.    Appearance: Normal appearance.  HENT:     Head: Normocephalic and atraumatic.  Eyes:     Conjunctiva/sclera: Conjunctivae normal.  Cardiovascular:     Rate and Rhythm: Normal rate and regular rhythm.     Heart sounds: Normal heart sounds. No murmur heard. Pulmonary:     Effort: Pulmonary effort is normal. No respiratory distress.     Breath sounds: Normal breath sounds. No wheezing.  Musculoskeletal:     Cervical back: Neck supple.     Right lower leg: No edema.     Left lower leg: No edema.  Lymphadenopathy:     Cervical: No cervical adenopathy.  Skin:    General: Skin is warm and dry.     Findings: No rash.     Comments: No dry skin, but areas on her hands that is hyperpigmented and the skin is thickened  Neurological:     Mental Status: She is alert. Mental status is at baseline.  Psychiatric:        Mood and Affect: Mood normal.        Behavior: Behavior  normal.            Assessment & Plan:    See Problem List for Assessment and Plan  of chronic medical problems.

## 2022-03-12 NOTE — Telephone Encounter (Signed)
Info complete

## 2022-03-13 ENCOUNTER — Ambulatory Visit (INDEPENDENT_AMBULATORY_CARE_PROVIDER_SITE_OTHER): Payer: Medicare Other | Admitting: Internal Medicine

## 2022-03-13 VITALS — BP 136/82 | HR 80 | Temp 98.4°F | Ht 66.0 in | Wt 287.0 lb

## 2022-03-13 DIAGNOSIS — E1142 Type 2 diabetes mellitus with diabetic polyneuropathy: Secondary | ICD-10-CM

## 2022-03-13 DIAGNOSIS — Z794 Long term (current) use of insulin: Secondary | ICD-10-CM | POA: Diagnosis not present

## 2022-03-13 DIAGNOSIS — E782 Mixed hyperlipidemia: Secondary | ICD-10-CM | POA: Diagnosis not present

## 2022-03-13 DIAGNOSIS — I10 Essential (primary) hypertension: Secondary | ICD-10-CM

## 2022-03-13 DIAGNOSIS — L853 Xerosis cutis: Secondary | ICD-10-CM | POA: Diagnosis not present

## 2022-03-13 LAB — CBC WITH DIFFERENTIAL/PLATELET
Basophils Absolute: 0.1 10*3/uL (ref 0.0–0.1)
Basophils Relative: 0.7 % (ref 0.0–3.0)
Eosinophils Absolute: 0.3 10*3/uL (ref 0.0–0.7)
Eosinophils Relative: 2.8 % (ref 0.0–5.0)
HCT: 39 % (ref 36.0–46.0)
Hemoglobin: 12.8 g/dL (ref 12.0–15.0)
Lymphocytes Relative: 39.9 % (ref 12.0–46.0)
Lymphs Abs: 4.8 10*3/uL — ABNORMAL HIGH (ref 0.7–4.0)
MCHC: 32.7 g/dL (ref 30.0–36.0)
MCV: 79.9 fl (ref 78.0–100.0)
Monocytes Absolute: 0.9 10*3/uL (ref 0.1–1.0)
Monocytes Relative: 7.3 % (ref 3.0–12.0)
Neutro Abs: 5.9 10*3/uL (ref 1.4–7.7)
Neutrophils Relative %: 49.3 % (ref 43.0–77.0)
Platelets: 319 10*3/uL (ref 150.0–400.0)
RBC: 4.88 Mil/uL (ref 3.87–5.11)
RDW: 15.8 % — ABNORMAL HIGH (ref 11.5–15.5)
WBC: 12 10*3/uL — ABNORMAL HIGH (ref 4.0–10.5)

## 2022-03-13 LAB — COMPREHENSIVE METABOLIC PANEL
ALT: 12 U/L (ref 0–35)
AST: 22 U/L (ref 0–37)
Albumin: 4.2 g/dL (ref 3.5–5.2)
Alkaline Phosphatase: 95 U/L (ref 39–117)
BUN: 9 mg/dL (ref 6–23)
CO2: 30 mEq/L (ref 19–32)
Calcium: 9.3 mg/dL (ref 8.4–10.5)
Chloride: 96 mEq/L (ref 96–112)
Creatinine, Ser: 0.75 mg/dL (ref 0.40–1.20)
GFR: 86.82 mL/min (ref >60.00)
Glucose, Bld: 213 mg/dL — ABNORMAL HIGH (ref 70–99)
Potassium: 3.8 mEq/L (ref 3.5–5.1)
Sodium: 137 mEq/L (ref 135–145)
Total Bilirubin: 0.5 mg/dL (ref 0.2–1.2)
Total Protein: 7.5 g/dL (ref 6.0–8.3)

## 2022-03-13 LAB — HEMOGLOBIN A1C: Hgb A1c MFr Bld: 9.1 % — ABNORMAL HIGH (ref 4.6–6.5)

## 2022-03-13 NOTE — Assessment & Plan Note (Signed)
Chronic She feels sugars are controlled at home-Sugar was 99 today Not always compliant with diabetic diet Wanting to have revision of left knee replacement-needs A1c to be less than 7.8 Currently taking Toujeo insulin 160 units twice daily ?  Seeing endocrine recently A1c, CMP

## 2022-03-13 NOTE — Assessment & Plan Note (Signed)
Chronic Bilateral hands Uses Vaseline religiously Advised that she can try cortisone on certain areas to see if that helps in addition to the Vaseline

## 2022-03-13 NOTE — Assessment & Plan Note (Signed)
Chronic °Regular exercise and healthy diet encouraged °Check lipid panel  °Continue atorvastatin 20 mg daily °

## 2022-03-13 NOTE — Assessment & Plan Note (Signed)
Chronic Blood pressure adequately controlled here today Continue clonidine 0.3 mg 3 times daily, furosemide 80 mg daily, losartan 50 mg daily and metoprolol XL 200 mg twice daily

## 2022-03-13 NOTE — Patient Instructions (Addendum)
      Blood work was ordered.   The lab is on the first floor.    Medications changes include :   none     Return in about 6 months (around 09/12/2022) for follow up.

## 2022-03-18 ENCOUNTER — Telehealth: Payer: Self-pay | Admitting: Orthopaedic Surgery

## 2022-03-18 NOTE — Telephone Encounter (Signed)
Patient is having dental procedure done and [facility] is asking if patient needs pre med.Tammy Strickland) at the [facility]. Please advise..[phone]

## 2022-03-18 NOTE — Telephone Encounter (Signed)
Left message per Ashley(blackmans CMA) that the dental procedure is ok without premed.

## 2022-03-24 ENCOUNTER — Telehealth: Payer: Self-pay | Admitting: Internal Medicine

## 2022-03-24 MED ORDER — TIZANIDINE HCL 4 MG PO TABS: 4.0000 mg | ORAL_TABLET | Freq: Every day | ORAL | 1 refills | Status: DC

## 2022-03-24 NOTE — Telephone Encounter (Signed)
Caller & Relationship to patient: Pharmacy  Call back number: [phone]   Date of last office visit: 12.1.23  Date of next office visit: 3.4.23  Medication(s) to be refilled:  tiZANidine (ZANAFLEX) 4 MG tablet    Preferred Pharmacy:  [email or web address] -   Phone: [phone]  Fax: [phone]

## 2022-03-26 DIAGNOSIS — M25562 Pain in left knee: Secondary | ICD-10-CM | POA: Diagnosis not present

## 2022-03-26 DIAGNOSIS — M170 Bilateral primary osteoarthritis of knee: Secondary | ICD-10-CM | POA: Diagnosis not present

## 2022-03-26 DIAGNOSIS — J45991 Cough variant asthma: Secondary | ICD-10-CM | POA: Diagnosis not present

## 2022-03-26 DIAGNOSIS — J3089 Other allergic rhinitis: Secondary | ICD-10-CM | POA: Diagnosis not present

## 2022-03-26 DIAGNOSIS — M25579 Pain in unspecified ankle and joints of unspecified foot: Secondary | ICD-10-CM | POA: Diagnosis not present

## 2022-03-26 DIAGNOSIS — M545 Low back pain, unspecified: Secondary | ICD-10-CM | POA: Diagnosis not present

## 2022-03-26 DIAGNOSIS — M25549 Pain in joints of unspecified hand: Secondary | ICD-10-CM | POA: Diagnosis not present

## 2022-03-26 DIAGNOSIS — M25552 Pain in left hip: Secondary | ICD-10-CM | POA: Diagnosis not present

## 2022-03-26 DIAGNOSIS — G894 Chronic pain syndrome: Secondary | ICD-10-CM | POA: Diagnosis not present

## 2022-03-26 DIAGNOSIS — M797 Fibromyalgia: Secondary | ICD-10-CM | POA: Diagnosis not present

## 2022-03-26 DIAGNOSIS — Z79891 Long term (current) use of opiate analgesic: Secondary | ICD-10-CM | POA: Diagnosis not present

## 2022-03-26 DIAGNOSIS — M5136 Other intervertebral disc degeneration, lumbar region: Secondary | ICD-10-CM | POA: Diagnosis not present

## 2022-03-26 DIAGNOSIS — M79604 Pain in right leg: Secondary | ICD-10-CM | POA: Diagnosis not present

## 2022-04-23 DIAGNOSIS — Z79891 Long term (current) use of opiate analgesic: Secondary | ICD-10-CM | POA: Diagnosis not present

## 2022-04-23 DIAGNOSIS — M79604 Pain in right leg: Secondary | ICD-10-CM | POA: Diagnosis not present

## 2022-04-23 DIAGNOSIS — M170 Bilateral primary osteoarthritis of knee: Secondary | ICD-10-CM | POA: Diagnosis not present

## 2022-04-23 DIAGNOSIS — G43909 Migraine, unspecified, not intractable, without status migrainosus: Secondary | ICD-10-CM | POA: Diagnosis not present

## 2022-04-23 DIAGNOSIS — M25549 Pain in joints of unspecified hand: Secondary | ICD-10-CM | POA: Diagnosis not present

## 2022-04-23 DIAGNOSIS — M25562 Pain in left knee: Secondary | ICD-10-CM | POA: Diagnosis not present

## 2022-04-23 DIAGNOSIS — M797 Fibromyalgia: Secondary | ICD-10-CM | POA: Diagnosis not present

## 2022-04-23 DIAGNOSIS — J45991 Cough variant asthma: Secondary | ICD-10-CM | POA: Diagnosis not present

## 2022-04-23 DIAGNOSIS — M25579 Pain in unspecified ankle and joints of unspecified foot: Secondary | ICD-10-CM | POA: Diagnosis not present

## 2022-04-23 DIAGNOSIS — M5136 Other intervertebral disc degeneration, lumbar region: Secondary | ICD-10-CM | POA: Diagnosis not present

## 2022-04-23 DIAGNOSIS — G894 Chronic pain syndrome: Secondary | ICD-10-CM | POA: Diagnosis not present

## 2022-04-23 DIAGNOSIS — M25552 Pain in left hip: Secondary | ICD-10-CM | POA: Diagnosis not present

## 2022-04-23 DIAGNOSIS — J3089 Other allergic rhinitis: Secondary | ICD-10-CM | POA: Diagnosis not present

## 2022-05-18 ENCOUNTER — Other Ambulatory Visit: Payer: Self-pay | Admitting: *Deleted

## 2022-05-18 MED ORDER — ACCU-CHEK SOFTCLIX LANCETS MISC: 11 refills | Status: DC

## 2022-05-18 MED ORDER — ACCU-CHEK GUIDE VI STRP: ORAL_STRIP | 11 refills | Status: DC

## 2022-05-21 DIAGNOSIS — M797 Fibromyalgia: Secondary | ICD-10-CM | POA: Diagnosis not present

## 2022-05-21 DIAGNOSIS — M545 Low back pain, unspecified: Secondary | ICD-10-CM | POA: Diagnosis not present

## 2022-05-21 DIAGNOSIS — Z79891 Long term (current) use of opiate analgesic: Secondary | ICD-10-CM | POA: Diagnosis not present

## 2022-05-21 DIAGNOSIS — M5136 Other intervertebral disc degeneration, lumbar region: Secondary | ICD-10-CM | POA: Diagnosis not present

## 2022-05-21 DIAGNOSIS — J0110 Acute frontal sinusitis, unspecified: Secondary | ICD-10-CM | POA: Diagnosis not present

## 2022-05-21 DIAGNOSIS — M25579 Pain in unspecified ankle and joints of unspecified foot: Secondary | ICD-10-CM | POA: Diagnosis not present

## 2022-05-21 DIAGNOSIS — J3089 Other allergic rhinitis: Secondary | ICD-10-CM | POA: Diagnosis not present

## 2022-05-21 DIAGNOSIS — G894 Chronic pain syndrome: Secondary | ICD-10-CM | POA: Diagnosis not present

## 2022-05-21 DIAGNOSIS — M25562 Pain in left knee: Secondary | ICD-10-CM | POA: Diagnosis not present

## 2022-05-21 DIAGNOSIS — M25552 Pain in left hip: Secondary | ICD-10-CM | POA: Diagnosis not present

## 2022-05-21 DIAGNOSIS — M25549 Pain in joints of unspecified hand: Secondary | ICD-10-CM | POA: Diagnosis not present

## 2022-05-21 DIAGNOSIS — M79604 Pain in right leg: Secondary | ICD-10-CM | POA: Diagnosis not present

## 2022-05-21 DIAGNOSIS — M170 Bilateral primary osteoarthritis of knee: Secondary | ICD-10-CM | POA: Diagnosis not present

## 2022-06-01 ENCOUNTER — Encounter: Payer: Self-pay | Admitting: Internal Medicine

## 2022-06-01 DIAGNOSIS — Z96652 Presence of left artificial knee joint: Secondary | ICD-10-CM | POA: Diagnosis not present

## 2022-06-01 DIAGNOSIS — M25561 Pain in right knee: Secondary | ICD-10-CM | POA: Diagnosis not present

## 2022-06-01 NOTE — Progress Notes (Unsigned)
Subjective:    Patient ID: Tammy Strickland, female    DOB: [birth year], 62 y.o.   MRN: [id number]      HPI Tammy Strickland is here for No chief complaint on file.    Referral:    Medications and allergies reviewed with patient and updated if appropriate.  Current Outpatient Medications on File Prior to Visit  Medication Sig Dispense Refill   Accu-Chek Softclix Lancets lancets ONE EACH BY OTHER ROUTE FOUR TIMES DAILY. 100 each 11   albuterol (VENTOLIN HFA) 108 (90 Base) MCG/ACT inhaler Inhale 2 puffs into the lungs every 6 (six) hours as needed for wheezing or shortness of breath. 8 g 8   amitriptyline (ELAVIL) 75 MG tablet TAKE 1 TABLET BY MOUTH AT BEDTIME 30 tablet 11   ascorbic acid (VITAMIN C) 500 MG tablet Take 1 tablet (500 mg total) by mouth daily. 7 tablet 0   aspirin EC 81 MG tablet Take 1 tablet (81 mg total) by mouth daily.     atorvastatin (LIPITOR) 20 MG tablet Take 1 tablet by mouth every day 30 tablet 11   busPIRone (BUSPAR) 5 MG tablet TAKE 1 TABLET(5 MG) BY MOUTH THREE TIMES DAILY 90 tablet 1   Cholecalciferol (VITAMIN D3) 50 MCG (2000 UT) capsule Take 1 capsule (2,000 Units total) by mouth daily. 90 capsule 1   cloNIDine (CATAPRES) 0.3 MG tablet Take 1 tablet by mouth 3 times a day 90 tablet 11   cyclobenzaprine (FEXMID) 7.5 MG tablet Take 7.5 mg by mouth 3 (three) times daily.     diclofenac (CATAFLAM) 50 MG tablet Take by mouth.     DULoxetine (CYMBALTA) 60 MG capsule Take 60 mg by mouth daily.     fluticasone (FLONASE) 50 MCG/ACT nasal spray Shake liquid & use 1 spray into each nostril every day as needed for allergies 16 g 11   fluticasone furoate-vilanterol (BREO ELLIPTA) 100-25 MCG/INH AEPB Inhale 1 puff into the lungs daily. 1 each 11   furosemide (LASIX) 80 MG tablet Take 1 tablet by mouth every day 30 tablet 11   glucose blood (ACCU-CHEK GUIDE) test strip TEST TWICE DAILY 200 strip 11   hydrOXYzine (ATARAX/VISTARIL) 25 MG tablet Take 25 mg by mouth at bedtime.      insulin glargine, 2 Unit Dial, (TOUJEO MAX SOLOSTAR) 300 UNIT/ML Solostar Pen Inject 180 Units into the skin every morning. And pen needles 1/day (Patient taking differently: Inject 160 Units into the skin every evening. And pen needles 1/day) 60 mL 3   Insulin Pen Needle (PEN NEEDLES) 32G X 5 MM MISC UAD daily for insulin 90 each 3   Iron, Ferrous Sulfate, 325 (65 Fe) MG TABS Take 325 mg by mouth daily. 90 tablet 1   KLOR-CON M10 10 MEQ tablet Take 2 tablets by mouth every day 60 tablet 11   losartan (COZAAR) 50 MG tablet Take 1 tablet by mouth twice daily 60 tablet 11   metoprolol (TOPROL-XL) 200 MG 24 hr tablet Take 1 tablet by mouth twice daily 60 tablet 11   NARCAN 4 MG/0.1ML LIQD nasal spray kit Place 1 spray into the nose once.     nitroGLYCERIN (NITROSTAT) 0.4 MG SL tablet Place 0.4 mg under the tongue every 5 (five) minutes as needed for chest pain.     ondansetron (ZOFRAN) 4 MG tablet Take 4 mg by mouth every 8 (eight) hours as needed.     oxyCODONE-acetaminophen (PERCOCET) 10-325 MG tablet Take 1 tablet by mouth every 6 (six)  hours.     pantoprazole (PROTONIX) 40 MG tablet Take 1 tablet by mouth twice daily 60 tablet 11   SYMBICORT 80-4.5 MCG/ACT inhaler Inhale 2 puffs into the lungs daily as needed 10.2 each 11   tiZANidine (ZANAFLEX) 4 MG tablet Take 1 tablet (4 mg total) by mouth at bedtime. 90 tablet 1   ZTLIDO 1.8 % PTCH Apply 1 patch topically daily as needed (knee pain).     No current facility-administered medications on file prior to visit.    Review of Systems     Objective:  There were no vitals filed for this visit. BP Readings from Last 3 Encounters:  03/13/22 136/82  03/04/22 (!) 146/81  12/03/21 134/82   Wt Readings from Last 3 Encounters:  03/13/22 287 lb (130.2 kg)  03/04/22 287 lb (130.2 kg)  12/03/21 296 lb (134.3 kg)   There is no height or weight on file to calculate BMI.    Physical Exam         Assessment & Plan:    See Problem List  for Assessment and Plan of chronic medical problems.

## 2022-06-02 ENCOUNTER — Telehealth: Payer: Self-pay | Admitting: Internal Medicine

## 2022-06-02 ENCOUNTER — Ambulatory Visit (INDEPENDENT_AMBULATORY_CARE_PROVIDER_SITE_OTHER): Payer: 59 | Admitting: Internal Medicine

## 2022-06-02 VITALS — BP 142/82 | HR 80 | Temp 98.6°F | Ht 66.0 in | Wt 279.0 lb

## 2022-06-02 DIAGNOSIS — I10 Essential (primary) hypertension: Secondary | ICD-10-CM | POA: Diagnosis not present

## 2022-06-02 DIAGNOSIS — R109 Unspecified abdominal pain: Secondary | ICD-10-CM | POA: Diagnosis not present

## 2022-06-02 DIAGNOSIS — E1142 Type 2 diabetes mellitus with diabetic polyneuropathy: Secondary | ICD-10-CM

## 2022-06-02 DIAGNOSIS — M25562 Pain in left knee: Secondary | ICD-10-CM | POA: Diagnosis not present

## 2022-06-02 DIAGNOSIS — M542 Cervicalgia: Secondary | ICD-10-CM

## 2022-06-02 DIAGNOSIS — M25561 Pain in right knee: Secondary | ICD-10-CM

## 2022-06-02 DIAGNOSIS — E559 Vitamin D deficiency, unspecified: Secondary | ICD-10-CM | POA: Diagnosis not present

## 2022-06-02 DIAGNOSIS — Z794 Long term (current) use of insulin: Secondary | ICD-10-CM | POA: Diagnosis not present

## 2022-06-02 DIAGNOSIS — R350 Frequency of micturition: Secondary | ICD-10-CM

## 2022-06-02 DIAGNOSIS — G8929 Other chronic pain: Secondary | ICD-10-CM

## 2022-06-02 DIAGNOSIS — G894 Chronic pain syndrome: Secondary | ICD-10-CM | POA: Diagnosis not present

## 2022-06-02 DIAGNOSIS — M545 Low back pain, unspecified: Secondary | ICD-10-CM

## 2022-06-02 DIAGNOSIS — E782 Mixed hyperlipidemia: Secondary | ICD-10-CM | POA: Diagnosis not present

## 2022-06-02 LAB — COMPREHENSIVE METABOLIC PANEL
ALT: 17 U/L (ref 0–35)
AST: 17 U/L (ref 0–37)
Albumin: 4.1 g/dL (ref 3.5–5.2)
Alkaline Phosphatase: 124 U/L — ABNORMAL HIGH (ref 39–117)
BUN: 15 mg/dL (ref 6–23)
CO2: 27 mEq/L (ref 19–32)
Calcium: 10.5 mg/dL (ref 8.4–10.5)
Chloride: 97 mEq/L (ref 96–112)
Creatinine, Ser: 0.71 mg/dL (ref 0.40–1.20)
GFR: 92.58 mL/min (ref >60.00)
Glucose, Bld: 239 mg/dL — ABNORMAL HIGH (ref 70–99)
Potassium: 4.3 mEq/L (ref 3.5–5.1)
Sodium: 134 mEq/L — ABNORMAL LOW (ref 135–145)
Total Bilirubin: 0.5 mg/dL (ref 0.2–1.2)
Total Protein: 8.1 g/dL (ref 6.0–8.3)

## 2022-06-02 LAB — CBC WITH DIFFERENTIAL/PLATELET
Basophils Absolute: 0 10*3/uL (ref 0.0–0.1)
Basophils Relative: 0.3 % (ref 0.0–3.0)
Eosinophils Absolute: 0 10*3/uL (ref 0.0–0.7)
Eosinophils Relative: 0 % (ref 0.0–5.0)
HCT: 39.3 % (ref 36.0–46.0)
Hemoglobin: 12.7 g/dL (ref 12.0–15.0)
Lymphocytes Relative: 21.4 % (ref 12.0–46.0)
Lymphs Abs: 3.5 10*3/uL (ref 0.7–4.0)
MCHC: 32.4 g/dL (ref 30.0–36.0)
MCV: 78.4 fl (ref 78.0–100.0)
Monocytes Absolute: 1.3 10*3/uL — ABNORMAL HIGH (ref 0.1–1.0)
Monocytes Relative: 7.9 % (ref 3.0–12.0)
Neutro Abs: 11.7 10*3/uL — ABNORMAL HIGH (ref 1.4–7.7)
Neutrophils Relative %: 70.4 % (ref 43.0–77.0)
Platelets: 340 10*3/uL (ref 150.0–400.0)
RBC: 5.01 Mil/uL (ref 3.87–5.11)
RDW: 17.1 % — ABNORMAL HIGH (ref 11.5–15.5)
WBC: 16.6 10*3/uL — ABNORMAL HIGH (ref 4.0–10.5)

## 2022-06-02 LAB — URINALYSIS, ROUTINE W REFLEX MICROSCOPIC
Bilirubin Urine: NEGATIVE
Hgb urine dipstick: NEGATIVE
Ketones, ur: NEGATIVE
Leukocytes,Ua: NEGATIVE
Nitrite: NEGATIVE
Specific Gravity, Urine: 1.01 (ref 1.000–1.030)
Total Protein, Urine: NEGATIVE
Urine Glucose: 500 — AB
Urobilinogen, UA: 0.2 (ref 0.0–1.0)
pH: 6.5 (ref 5.0–8.0)

## 2022-06-02 LAB — LIPID PANEL
Cholesterol: 149 mg/dL (ref 0–200)
HDL: 65.2 mg/dL (ref >39.00)
LDL Cholesterol: 61 mg/dL (ref 0–99)
NonHDL: 83.78
Total CHOL/HDL Ratio: 2
Triglycerides: 112 mg/dL (ref 0.0–149.0)
VLDL: 22.4 mg/dL (ref 0.0–40.0)

## 2022-06-02 LAB — MICROALBUMIN / CREATININE URINE RATIO
Creatinine,U: 92.4 mg/dL
Microalb Creat Ratio: 2.7 mg/g (ref 0.0–30.0)
Microalb, Ur: 2.5 mg/dL — ABNORMAL HIGH (ref 0.0–1.9)

## 2022-06-02 LAB — HEMOGLOBIN A1C: Hgb A1c MFr Bld: 9.1 % — ABNORMAL HIGH (ref 4.6–6.5)

## 2022-06-02 LAB — VITAMIN D 25 HYDROXY (VIT D DEFICIENCY, FRACTURES): VITD: 15.03 ng/mL — ABNORMAL LOW (ref 30.00–100.00)

## 2022-06-02 MED ORDER — OXYCODONE-ACETAMINOPHEN 10-325 MG PO TABS: 1.0000 | ORAL_TABLET | Freq: Four times a day (QID) | ORAL | 0 refills | Status: DC

## 2022-06-02 NOTE — Assessment & Plan Note (Signed)
Chronic Check vitamin D level 

## 2022-06-02 NOTE — Assessment & Plan Note (Signed)
Acute States some urinary frequency-back pain is likely muscular in nature, but will check UA, urine culture to rule out infection

## 2022-06-02 NOTE — Assessment & Plan Note (Signed)
Chronic diffuse pain Chronic lower back pain and chronic bilateral knee pain Has been following with pain management for years Is no longer seeing her previous pain management because they were making her wait outside in the cold standing for an hour or so before it was time for her appointment-she does not drive and would have to get dropped off Currently taking oxycodone-acetaminophen 10-325 mg 1 tab every 6 hours as needed-prescribed by pain management Also taking tizanidine 4 mg nightly and amitriptyline 75 mg nightly, both of which I am prescribing Referral order for new pain management I will give her a prescription for a month or 2 at most, but I cannot do her pain management long-term

## 2022-06-02 NOTE — Assessment & Plan Note (Signed)
Chronic Poorly controlled Was following with endocrine and when her endocrinologist retired had been having difficulty getting her in with another 1-2 referral was ordered, but she states she was not called Rest that she needs to follow-up with an endocrinologist to help get her sugars controlled Currently on insulin regimen that she has been following since she saw her endocrinologist Number given for Dr. Buddy Duty who I referred her to in the fall-she will call today Will check A1c today

## 2022-06-02 NOTE — Assessment & Plan Note (Signed)
Acute Likely muscular in nature Already on tizanidine at night and amitriptyline in addition to her oxycodone-acetaminophen I will prescribe anything additional to what she is already taking Continue warm compresses, topical medications

## 2022-06-02 NOTE — Assessment & Plan Note (Signed)
Chronic lower back pain Has been following with pain management for years Is no longer seeing her previous pain management because they were making her wait outside in the cold standing for an hour or so before it was time for her appointment-she does not drive and would have to get dropped off Currently taking oxycodone-acetaminophen 10-325 mg 1 tab every 6 hours as needed-prescribed by pain management Also taking tizanidine 4 mg nightly and amitriptyline 75 mg nightly, both of which I am prescribing Referral order for new pain management I will give her a prescription for a month or 2 at most, but I cannot do her pain management long-term

## 2022-06-02 NOTE — Telephone Encounter (Signed)
Patient called and said that her latest labs need to be sent [facility] -  Phone [phone] - They need this to schedule her surgery - Fax #  [phone] - Dr. Lyla Glassing

## 2022-06-02 NOTE — Assessment & Plan Note (Signed)
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

## 2022-06-02 NOTE — Assessment & Plan Note (Signed)
Chronic Blood pressure slightly elevated here today-slightly better on repeat she is working on weight loss Continue losartan 50 mg twice daily, metoprolol XL 200 mg twice daily

## 2022-06-02 NOTE — Telephone Encounter (Signed)
Pt came today to have done. Labs have been collected only.Marland KitchenJohny Strickland

## 2022-06-02 NOTE — Assessment & Plan Note (Signed)
Chronic °Regular exercise and healthy diet encouraged °Check lipid panel  °Continue atorvastatin 20 mg daily °

## 2022-06-02 NOTE — Assessment & Plan Note (Signed)
Acute Left side pain Worse with movement-likely muscular for related to back Will rule out UTI given urine frequency No further medication or workup necessary

## 2022-06-02 NOTE — Patient Instructions (Addendum)
   Dr Buddy Duty [facility] @ [address] [place], [place] [address] [phone]     Blood work was ordered.   The lab is on the first floor.    Medications changes include :   none    A referral was ordered for pain management.     Someone will call you to schedule an appointment.    Return in about 4 weeks (around 06/30/2022) for follow up.

## 2022-06-03 LAB — URINE CULTURE

## 2022-06-03 NOTE — Telephone Encounter (Signed)
Faxed labs to [facility].Marland KitchenJohny Chess

## 2022-06-04 ENCOUNTER — Telehealth: Payer: Self-pay | Admitting: Internal Medicine

## 2022-06-04 MED ORDER — VITAMIN D (ERGOCALCIFEROL) 1.25 MG (50000 UNIT) PO CAPS: 50000.0000 [IU] | ORAL_CAPSULE | ORAL | 0 refills | Status: AC

## 2022-06-04 NOTE — Telephone Encounter (Signed)
Called back pt to go over her lab results .

## 2022-06-04 NOTE — Telephone Encounter (Signed)
Patient called and said she missed a call from this office. She would like a call back at [phone]

## 2022-06-04 NOTE — Telephone Encounter (Signed)
No documentation who Have called [facility]/labs was faxed to [facility] 2/20 for surgery. Did you call Carla../l;mb

## 2022-06-04 NOTE — Addendum Note (Signed)
Addended by: Binnie Rail on: 06/04/2022 05:17 AM   Modules accepted: Orders

## 2022-06-07 ENCOUNTER — Other Ambulatory Visit: Payer: Self-pay | Admitting: Internal Medicine

## 2022-06-08 ENCOUNTER — Other Ambulatory Visit: Payer: Self-pay | Admitting: Internal Medicine

## 2022-06-10 ENCOUNTER — Other Ambulatory Visit: Payer: Self-pay | Admitting: Internal Medicine

## 2022-06-10 MED ORDER — TIRZEPATIDE 2.5 MG/0.5ML ~~LOC~~ SOAJ: 2.5000 mg | SUBCUTANEOUS | 0 refills | Status: DC

## 2022-06-15 ENCOUNTER — Ambulatory Visit (INDEPENDENT_AMBULATORY_CARE_PROVIDER_SITE_OTHER): Payer: 59

## 2022-06-15 VITALS — Ht 66.0 in | Wt 275.0 lb

## 2022-06-15 DIAGNOSIS — Z0000 Encounter for general adult medical examination without abnormal findings: Secondary | ICD-10-CM

## 2022-06-15 NOTE — Progress Notes (Signed)
I connected with  Tammy Strickland on 06/15/2022 at 8:45 a.m. EST by telephone and verified that I am speaking with the correct person using two identifiers.  Location: Patient: Home Provider: [place] Persons participating in the virtual visit: [place]   I discussed the limitations, risks, security and privacy concerns of performing an evaluation and management service by telephone and the availability of in person appointments. The patient expressed understanding and agreed to proceed.  Interactive audio and video telecommunications were attempted between this nurse and patient, however failed, due to patient having technical difficulties OR patient did not have access to video capability.  We continued and completed visit with audio only.  Some vital signs may be absent or patient reported.   Sheral Flow, LPN  Subjective:   Tammy Strickland is a 62 y.o. female who presents for Medicare Annual (Subsequent) preventive examination.  Review of Systems     Cardiac Risk Factors include: diabetes mellitus;dyslipidemia;family history of premature cardiovascular disease;hypertension;sedentary lifestyle;obesity (BMI >30kg/m2);smoking/ tobacco exposure     Objective:    Today's Vitals   06/15/22 0849  Weight: 275 lb (124.7 kg)  Height: '5\' 6"'$  (1.676 m)  PainSc: 8   PainLoc: Generalized   Body mass index is 44.39 kg/m.     06/15/2022    8:53 AM 06/13/2021    8:44 AM 03/17/2021    6:07 AM 11/02/2020   10:40 AM 10/09/2020    3:29 PM 09/01/2020    4:57 PM 08/31/2020    6:57 PM  Advanced Directives  Does Patient Have a Medical Advance Directive? No No No No No  No  Would patient like information on creating a medical advance directive? No - Patient declined No - Patient declined No - Patient declined   No - Patient declined     Current Medications (verified) Outpatient Encounter Medications as of 06/15/2022  Medication Sig   Accu-Chek Softclix Lancets  lancets ONE EACH BY OTHER ROUTE FOUR TIMES DAILY.   albuterol (VENTOLIN HFA) 108 (90 Base) MCG/ACT inhaler Inhale 2 puffs into the lungs every 6 (six) hours as needed for wheezing or shortness of breath.   amitriptyline (ELAVIL) 75 MG tablet Take 1 tablet by mouth at bedtime   ascorbic acid (VITAMIN C) 500 MG tablet Take 1 tablet (500 mg total) by mouth daily.   aspirin EC 81 MG tablet Take 1 tablet (81 mg total) by mouth daily.   atorvastatin (LIPITOR) 20 MG tablet Take 1 tablet by mouth every day   busPIRone (BUSPAR) 5 MG tablet TAKE 1 TABLET(5 MG) BY MOUTH THREE TIMES DAILY   Cholecalciferol (VITAMIN D3) 50 MCG (2000 UT) capsule Take 1 capsule (2,000 Units total) by mouth daily.   cloNIDine (CATAPRES) 0.3 MG tablet Take 1 tablet by mouth 3 times a day   cyclobenzaprine (FEXMID) 7.5 MG tablet Take 7.5 mg by mouth 3 (three) times daily.   diclofenac (CATAFLAM) 50 MG tablet Take by mouth.   DULoxetine (CYMBALTA) 60 MG capsule Take 60 mg by mouth daily.   fluticasone (FLONASE) 50 MCG/ACT nasal spray Shake liquid & use 1 spray into each nostril every day as needed for allergies   fluticasone furoate-vilanterol (BREO ELLIPTA) 100-25 MCG/INH AEPB Inhale 1 puff into the lungs daily.   furosemide (LASIX) 80 MG tablet Take 1 tablet by mouth every day   glucose blood (ACCU-CHEK GUIDE) test strip TEST TWICE DAILY   hydrOXYzine (ATARAX/VISTARIL) 25 MG tablet Take 25 mg by mouth at bedtime.  insulin glargine, 2 Unit Dial, (TOUJEO MAX SOLOSTAR) 300 UNIT/ML Solostar Pen Inject 180 Units into the skin every morning. And pen needles 1/day (Patient taking differently: Inject 160 Units into the skin every evening. And pen needles 1/day)   Insulin Pen Needle (PEN NEEDLES) 32G X 5 MM MISC UAD daily for insulin   Iron, Ferrous Sulfate, 325 (65 Fe) MG TABS Take 325 mg by mouth daily.   KLOR-CON M10 10 MEQ tablet Take 2 tablets by mouth every day   losartan (COZAAR) 50 MG tablet Take 1 tablet by mouth twice daily    metoprolol (TOPROL-XL) 200 MG 24 hr tablet Take 1 tablet by mouth twice daily   NARCAN 4 MG/0.1ML LIQD nasal spray kit Place 1 spray into the nose once.   nitroGLYCERIN (NITROSTAT) 0.4 MG SL tablet Place 0.4 mg under the tongue every 5 (five) minutes as needed for chest pain.   ondansetron (ZOFRAN) 4 MG tablet Take 4 mg by mouth every 8 (eight) hours as needed.   oxyCODONE-acetaminophen (PERCOCET) 10-325 MG tablet Take 1 tablet by mouth every 6 (six) hours.   pantoprazole (PROTONIX) 40 MG tablet Take 1 tablet by mouth twice daily   SYMBICORT 80-4.5 MCG/ACT inhaler Inhale 2 puffs into the lungs daily as needed   tirzepatide [facility]) 2.5 MG/0.5ML Pen Inject 2.5 mg into the skin once a week.   tiZANidine (ZANAFLEX) 4 MG tablet Take 1 tablet (4 mg total) by mouth at bedtime.   Vitamin D, Ergocalciferol, (DRISDOL) 1.25 MG (50000 UNIT) CAPS capsule Take 1 capsule (50,000 Units total) by mouth every 7 (seven) days.   ZTLIDO 1.8 % PTCH Apply 1 patch topically daily as needed (knee pain).   No facility-administered encounter medications on file as of 06/15/2022.    Allergies (verified) Metformin and related, Other, Ozempic (0.25 or 0.5 mg-dose) [semaglutide(0.25 or 0.'5mg'$ -dos)], Quinolones, and Bacitracin   History: Past Medical History:  Diagnosis Date   Ambulates with cane 03/13/2021   Anxiety    Arthritis    Asthma    CHF (congestive heart failure) (HCC)    COPD (chronic obstructive pulmonary disease) ([place])    Coronary artery disease    Depression    Diabetes mellitus without complication ([place])    type 2    Fibromyalgia    Gallstones    GERD (gastroesophageal reflux disease)    HLD (hyperlipidemia)    Hypertension    Lupus (HCC)    Migraines    Myocardial infarction ([place]) 2017   Neuromuscular disorder ([place])    Osteoporosis    Pancreatitis 05/27/2019   none since   Peripheral neuropathy 03/13/2021   all over pt pt   Shingles    Sleep apnea    sleep study 03-03-2017 mild osa no  cpap used per pt   Stroke ([place]) 10/2015   no residual from   Uses walker 03/13/2021   Wears glasses    Past Surgical History:  Procedure Laterality Date   ABDOMINAL HYSTERECTOMY  1989   ovaries left   APPENDECTOMY  1989   BREAST LUMPECTOMY WITH RADIOACTIVE SEED LOCALIZATION Left 06/14/2019   Procedure: LEFT BREAST LUMPECTOMY WITH RADIOACTIVE SEED LOCALIZATION X 2;  Surgeon: Coralie Keens, MD;  Location: [facility];  Service: General;  Laterality: Left;  LMA   CHOLECYSTECTOMY  1989   IR GENERIC HISTORICAL  11/07/2015   IR ANGIO INTRA EXTRACRAN SEL COM CAROTID INNOMINATE BILAT MOD SED 11/07/2015 Luanne Bras, MD [facility]   IR GENERIC HISTORICAL  11/07/2015  IR ANGIO VERTEBRAL SEL SUBCLAVIAN INNOMINATE UNI R MOD SED 11/07/2015 Luanne Bras, MD [facility]   IR GENERIC HISTORICAL  11/07/2015   IR ANGIO VERTEBRAL SEL VERTEBRAL UNI L MOD SED 11/07/2015 Luanne Bras, MD [facility]   IR GENERIC HISTORICAL  11/07/2015   IR ANGIOGRAM EXTREMITY LEFT 11/07/2015 Luanne Bras, MD [facility]   MASS EXCISION Left 03/17/2021   Procedure: EXCISION MASS,left middle finger ulnar border mass excision,left thumb volar retinacular ganglion cyst excision;  Surgeon: Orene Desanctis, MD;  Location: [facility];  Service: Orthopedics;  Laterality: Left;   TONSILLECTOMY  2000   adenoids removed   TOTAL KNEE ARTHROPLASTY Left 03/12/2017   Procedure: LEFT TOTAL KNEE ARTHROPLASTY;  Surgeon: Mcarthur Rossetti, MD;  Location: [facility];  Service: Orthopedics;  Laterality: Left;  Adductor Block   TRIGGER FINGER RELEASE Left 03/17/2021   Procedure: left trigger thumb release;  Surgeon: Orene Desanctis, MD;  Location: [facility];  Service: Orthopedics;  Laterality: Left;  with local anesthesia   Family History  Problem Relation Age of Onset   Hyperlipidemia Mother    Hypertension Mother    Stroke Mother    Thyroid disease Mother    Heart attack Mother     Hyperlipidemia Father    Hypertension Father    Stroke Father    Heart attack Father    Hypertension Sister    Stroke Sister    Thyroid disease Sister    Breast cancer Sister    Crohn's disease Sister    Hypertension Sister    Hypertension Brother    Diabetes Brother    Hypertension Brother    Social History   Socioeconomic History   Marital status: Single    Spouse name: n/a   Number of children: 3   Years of education: 12+   Highest education level: Not on file  Occupational History   Occupation: disabled-falling, doesn't recall name of toxin    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  Tobacco Use   Smoking status: Every Day    Packs/day: 0.50    Years: 35.00    Total pack years: 17.50    Types: Cigarettes   Smokeless tobacco: Never   Tobacco comments:    referred  to smoking  cessation  classes at  [employer] Use   Vaping Use: Never used  Substance and Sexual Activity   Alcohol use: Yes    Comment: occasional   Drug use: No   Sexual activity: Not Currently    Partners: Female    Birth control/protection: Surgical    Comment: hysterectomy  Other Topics Concern   Not on file  Social History Narrative   Moved to [place] from [place], [place] February 2017, to help her daughter.   Lives with her daughter.   Sons live in [place] and [place].   She reports that there were originally 17 children in her family (she is the youngest), the oldest are deceased, some prior to her birth, and she isn't sure which were female/female or how they died.   Social Determinants of Health   Financial Resource Strain: Low Risk  (06/15/2022)   Overall Financial Resource Strain (CARDIA)    Difficulty of Paying Living Expenses: Not hard at all  Food Insecurity: No Food Insecurity (06/15/2022)   Hunger Vital Sign    Worried About Running Out of Food in the Last Year: Never true    Ran Out of Food in the Last Year: Never true  Transportation Needs: No Transportation  Needs (06/15/2022)   PRAPARE - Hydrologist (Medical): No    Lack of Transportation (Non-Medical): No  Physical Activity: Inactive (06/15/2022)   Exercise Vital Sign    Days of Exercise per Week: 0 days    Minutes of Exercise per Session: 0 min  Stress: No Stress Concern Present (06/15/2022)   [place]    Feeling of Stress : Not at all  Social Connections: Socially Isolated (06/15/2022)   Social Connection and Isolation Panel [NHANES]    Frequency of Communication with Friends and Family: More than three times a week    Frequency of Social Gatherings with Friends and Family: Once a week    Attends Religious Services: Never    Marine scientist or Organizations: No    Attends Music therapist: Never    Marital Status: Never married    Tobacco Counseling Ready to quit: Not Answered Counseling given: Not Answered Tobacco comments: referred  to smoking  cessation  classes at  [employer] [facility]    Clinical Intake:  Pre-visit preparation completed: Yes  Pain : No/denies pain Pain Score: 8      BMI - recorded: 44.39 Nutritional Status: BMI > 30  Obese Nutritional Risks: None Diabetes: No  How often do you need to have someone help you when you read instructions, pamphlets, or other written materials from your doctor or pharmacy?: 1 - Never What is the last grade level you completed in school?: HSG  Nutrition Risk Assessment:  Has the patient had any N/V/D within the last 2 months?  No  Does the patient have any non-healing wounds?  No  Has the patient had any unintentional weight loss or weight gain?  No   Diabetes:  Is the patient diabetic?  Yes  If diabetic, was a CBG obtained today?  No  Did the patient bring in their glucometer from home?  No  How often do you monitor your CBG's? 2-3 times daily.   Financial Strains and Diabetes Management:  Are you having any financial  strains with the device, your supplies or your medication? No .  Does the patient want to be seen by Chronic Care Management for management of their diabetes?  No  Would the patient like to be referred to a Nutritionist or for Diabetic Management?  No   Diabetic Exams:  Diabetic Eye Exam: Overdue for diabetic eye exam. Pt has been advised about the importance in completing this exam. Patient advised to call and schedule an eye exam. Diabetic Foot Exam: Completed 10/27/2021   Interpreter Needed?: No  Information entered by :: Lisette Abu, LPN.   Activities of Daily Living    06/15/2022    8:58 AM  In your present state of health, do you have any difficulty performing the following activities:  Hearing? 0  Vision? 0  Difficulty concentrating or making decisions? 0  Walking or climbing stairs? 1  Dressing or bathing? 0  Doing errands, shopping? 1  Comment uses Copywriter, advertising and eating ? N  Using the Toilet? N  In the past six months, have you accidently leaked urine? N  Do you have problems with loss of bowel control? N  Managing your Medications? N  Managing your Finances? N  Housekeeping or managing your Housekeeping? Y    Patient Care Team: Binnie Rail, MD as PCP - General (Internal Medicine) Greer Pickerel, MD  as Consulting Physician (General Surgery) Juluis Rainier as Consulting Physician (Optometry)  Indicate any recent [place] you may have received from other than [facility] providers in the past year (date may be approximate).     Assessment:   This is a routine wellness examination for Tammy Strickland.  Hearing/Vision screen Hearing Screening - Comments:: Denies hearing difficulties   Vision Screening - Comments:: Wears rx glasses - up to date with routine eye exams with Alois Cliche, OD.   Dietary issues and exercise activities discussed: Current Exercise Habits: The patient does not participate in regular exercise at present, Exercise limited  by: cardiac condition(s);orthopedic condition(s)   Goals Addressed             This Visit's Progress    Client will verbalize knowledge of diabetes self-management as evidenced by Hgb A1C <7 or as defined by provider.            Depression Screen    06/15/2022    8:55 AM 03/13/2022    9:21 AM 03/13/2022    8:01 AM 12/03/2021   11:49 AM 10/08/2021    7:56 AM 06/13/2021    8:48 AM 05/31/2020    8:52 AM  PHQ 2/9 Scores  PHQ - 2 Score 0 0 0 0 1 0 0  PHQ- 9 Score  '2 2 2 5      '$ Fall Risk    06/15/2022    8:53 AM 03/13/2022    8:01 AM 12/03/2021   11:48 AM 10/08/2021    7:57 AM 06/13/2021    8:45 AM  Fall Risk   Falls in the past year? 1 0 0 1 0  Number falls in past yr: 1 0 0 1 0  Injury with Fall? 1 0 0 0 0  Risk for fall due to : No Fall Risks;History of fall(s) No Fall Risks No Fall Risks  No Fall Risks  Follow up Education provided;Falls prevention discussed Falls evaluation completed Falls evaluation completed  Falls evaluation completed    FALL RISK PREVENTION PERTAINING TO THE HOME:  Any stairs in or around the home? No  If so, are there any without handrails? No  Home free of loose throw rugs in walkways, pet beds, electrical cords, etc? Yes  Adequate lighting in your home to reduce risk of falls? Yes   ASSISTIVE DEVICES UTILIZED TO PREVENT FALLS:  Life alert? No  Use of a cane, walker or w/c? Yes  Grab bars in the bathroom? Yes  Shower chair or bench in shower? No  Elevated toilet seat or a handicapped toilet? Yes   TIMED UP AND GO:  Was the test performed? No . Telephonic Visit  Cognitive Function:        06/15/2022    8:55 AM  6CIT Screen  What Year? 0 points  What month? 0 points  What time? 0 points  Count back from 20 0 points  Months in reverse 0 points  Repeat phrase 0 points  Total Score 0 points    Immunizations Immunization History  Administered Date(s) Administered   Influenza,inj,Quad PF,6+ Mos 01/15/2016, 12/25/2016, 01/24/2018,  01/07/2021   Influenza-Unspecified 01/15/2016, 12/25/2016, 01/24/2018   Pneumococcal Polysaccharide-23 04/16/2016   Tdap 01/11/2018    TDAP status: Up to date  Flu Vaccine status: Due, Education has been provided regarding the importance of this vaccine. Advised may receive this vaccine at local pharmacy or Health Dept. Aware to provide a copy of the vaccination record if obtained from local pharmacy or  Health Dept. Verbalized acceptance and understanding.  Pneumococcal vaccine status: Up to date  Covid-19 vaccine status: Declined, Education has been provided regarding the importance of this vaccine but patient still declined. Advised may receive this vaccine at local pharmacy or Health Dept.or vaccine clinic. Aware to provide a copy of the vaccination record if obtained from local pharmacy or Health Dept. Verbalized acceptance and understanding.  Qualifies for Shingles Vaccine? Yes   Zostavax completed No   Shingrix Completed?: No.    Education has been provided regarding the importance of this vaccine. Patient has been advised to call insurance company to determine out of pocket expense if they have not yet received this vaccine. Advised may also receive vaccine at local pharmacy or Health Dept. Verbalized acceptance and understanding.  Screening Tests Health Maintenance  Topic Date Due   DEXA SCAN  Never done   COVID-19 Vaccine (1) Never done   Zoster Vaccines- Shingrix (1 of 2) Never done   OPHTHALMOLOGY EXAM  05/27/2019   INFLUENZA VACCINE  07/12/2022 (Originally 11/11/2021)   FOOT EXAM  10/28/2022   HEMOGLOBIN A1C  12/01/2022   Diabetic kidney evaluation - eGFR measurement  06/03/2023   Diabetic kidney evaluation - Urine ACR  06/03/2023   Medicare Annual Wellness (AWV)  06/15/2023   MAMMOGRAM  12/26/2023   COLONOSCOPY (Pts 45-36yr Insurance coverage will need to be confirmed)  02/23/2024   DTaP/Tdap/Td (2 - Td or Tdap) 01/12/2028   Hepatitis C Screening  Completed   HIV  Screening  Completed   HPV VACCINES  Aged Out    Health Maintenance  Health Maintenance Due  Topic Date Due   DEXA SCAN  Never done   COVID-19 Vaccine (1) Never done   Zoster Vaccines- Shingrix (1 of 2) Never done   OPHTHALMOLOGY EXAM  05/27/2019    Colorectal cancer screening: Type of screening: Colonoscopy. Completed 02/22/2014. Repeat every 10 years  Mammogram status: Completed 12/25/2021. Repeat every year  Bone density scan: Never done  Lung Cancer Screening: (Low Dose CT Chest recommended if Age 61-80years, 30 pack-year currently smoking OR have quit w/in 15years.) does not qualify.   Lung Cancer Screening Referral: no  Additional Screening:  Hepatitis C Screening: does qualify; Completed 06/15/2018  Vision Screening: Recommended annual ophthalmology exams for early detection of glaucoma and other disorders of the eye. Is the patient up to date with their annual eye exam?  No  Who is the provider or what is the name of the office in which the patient attends annual eye exams? PAlois Cliche OD. If pt is not established with a provider, would they like to be referred to a provider to establish care? No .   Dental Screening: Recommended annual dental exams for proper oral hygiene  Community Resource Referral / Chronic Care Management: CRR required this visit?  No   CCM required this visit?  No      Plan:     I have personally reviewed and noted the following in the patient's chart:   Medical and social history Use of alcohol, tobacco or illicit drugs  Current medications and supplements including opioid prescriptions. Patient is currently taking opioid prescriptions. Information provided to patient regarding non-opioid alternatives. Patient advised to discuss non-opioid treatment plan with their provider. Functional ability and status Nutritional status Physical activity Advanced directives List of other physicians Hospitalizations, surgeries, and ER visits in  previous 12 months Vitals Screenings to include cognitive, depression, and falls Referrals and appointments  In addition, I  have reviewed and discussed with patient certain preventive protocols, quality metrics, and best practice recommendations. A written personalized care plan for preventive services as well as general preventive health recommendations were provided to patient.     Sheral Flow, LPN   [id number]   Nurse Notes: Normal cognitive status assessed by direct observation by this Nurse Health Advisor. No abnormalities found.

## 2022-06-15 NOTE — Patient Instructions (Addendum)
Tammy Strickland , Thank you for taking time to come for your Medicare Wellness Visit. I appreciate your ongoing commitment to your health goals. Please review the following plan we discussed and let me know if I can assist you in the future.   These are the goals we discussed:  Goals      Client will verbalize knowledge of diabetes self-management as evidenced by Hgb A1C <7 or as defined by provider.            This is a list of the screening recommended for you and due dates:  Health Maintenance  Topic Date Due   DEXA scan (bone density measurement)  Never done   COVID-19 Vaccine (1) Never done   Zoster (Shingles) Vaccine (1 of 2) Never done   Eye exam for diabetics  05/27/2019   Flu Shot  07/12/2022*   Complete foot exam   10/28/2022   Hemoglobin A1C  12/01/2022   Yearly kidney function blood test for diabetes  06/03/2023   Yearly kidney health urinalysis for diabetes  06/03/2023   Medicare Annual Wellness Visit  06/15/2023   Mammogram  12/26/2023   Colon Cancer Screening  02/23/2024   DTaP/Tdap/Td vaccine (2 - Td or Tdap) 01/12/2028   Hepatitis C Screening: USPSTF Recommendation to screen - Ages 84-79 yo.  Completed   HIV Screening  Completed   HPV Vaccine  Aged Out  *Topic was postponed. The date shown is not the original due date.    Advanced directives: No  Conditions/risks identified: Yes; Type II Diabetes and Smoking  Next appointment: Follow up in one year for your annual wellness visit.   Preventive Care 40-64 Years, Female Preventive care refers to lifestyle choices and visits with your health care provider that can promote health and wellness. What does preventive care include? A yearly physical exam. This is also called an annual well check. Dental exams once or twice a year. Routine eye exams. Ask your health care provider how often you should have your eyes checked. Personal lifestyle choices, including: Daily care of your teeth and gums. Regular physical  activity. Eating a healthy diet. Avoiding tobacco and drug use. Limiting alcohol use. Practicing safe sex. Taking low-dose aspirin daily starting at age 73. Taking vitamin and mineral supplements as recommended by your health care provider. What happens during an annual well check? The services and screenings done by your health care provider during your annual well check will depend on your age, overall health, lifestyle risk factors, and family history of disease. Counseling  Your health care provider may ask you questions about your: Alcohol use. Tobacco use. Drug use. Emotional well-being. Home and relationship well-being. Sexual activity. Eating habits. Work and work Statistician. Method of birth control. Menstrual cycle. Pregnancy history. Screening  You may have the following tests or measurements: Height, weight, and BMI. Blood pressure. Lipid and cholesterol levels. These may be checked every 5 years, or more frequently if you are over 36 years old. Skin check. Lung cancer screening. You may have this screening every year starting at age 66 if you have a 30-pack-year history of smoking and currently smoke or have quit within the past 15 years. Fecal occult blood test (FOBT) of the stool. You may have this test every year starting at age 50. Flexible sigmoidoscopy or colonoscopy. You may have a sigmoidoscopy every 5 years or a colonoscopy every 10 years starting at age 51. Hepatitis C blood test. Hepatitis B blood test. Sexually transmitted disease (STD)  testing. Diabetes screening. This is done by checking your blood sugar (glucose) after you have not eaten for a while (fasting). You may have this done every 1-3 years. Mammogram. This may be done every 1-2 years. Talk to your health care provider about when you should start having regular mammograms. This may depend on whether you have a family history of breast cancer. BRCA-related cancer screening. This may be done if  you have a family history of breast, ovarian, tubal, or peritoneal cancers. Pelvic exam and Pap test. This may be done every 3 years starting at age 76. Starting at age 32, this may be done every 5 years if you have a Pap test in combination with an HPV test. Bone density scan. This is done to screen for osteoporosis. You may have this scan if you are at high risk for osteoporosis. Discuss your test results, treatment options, and if necessary, the need for more tests with your health care provider. Vaccines  Your health care provider may recommend certain vaccines, such as: Influenza vaccine. This is recommended every year. Tetanus, diphtheria, and acellular pertussis (Tdap, Td) vaccine. You may need a Td booster every 10 years. Zoster vaccine. You may need this after age 57. Pneumococcal 13-valent conjugate (PCV13) vaccine. You may need this if you have certain conditions and were not previously vaccinated. Pneumococcal polysaccharide (PPSV23) vaccine. You may need one or two doses if you smoke cigarettes or if you have certain conditions. Talk to your health care provider about which screenings and vaccines you need and how often you need them. This information is not intended to replace advice given to you by your health care provider. Make sure you discuss any questions you have with your health care provider. Document Released: 04/26/2015 Document Revised: 12/18/2015 Document Reviewed: 01/29/2015 Elsevier Interactive Patient Education  2017 [place] Prevention in the Home Falls can cause injuries. They can happen to people of all ages. There are many things you can do to make your home safe and to help prevent falls. What can I do on the outside of my home? Regularly fix the edges of walkways and driveways and fix any cracks. Remove anything that might make you trip as you walk through a door, such as a raised step or threshold. Trim any bushes or trees on the path to your  home. Use bright outdoor lighting. Clear any walking paths of anything that might make someone trip, such as rocks or tools. Regularly check to see if handrails are loose or broken. Make sure that both sides of any steps have handrails. Any raised decks and porches should have guardrails on the edges. Have any leaves, snow, or ice cleared regularly. Use sand or salt on walking paths during winter. Clean up any spills in your garage right away. This includes oil or grease spills. What can I do in the bathroom? Use night lights. Install grab bars by the toilet and in the tub and shower. Do not use towel bars as grab bars. Use non-skid mats or decals in the tub or shower. If you need to sit down in the shower, use a plastic, non-slip stool. Keep the floor dry. Clean up any water that spills on the floor as soon as it happens. Remove soap buildup in the tub or shower regularly. Attach bath mats securely with double-sided non-slip rug tape. Do not have throw rugs and other things on the floor that can make you trip. What can I do  in the bedroom? Use night lights. Make sure that you have a light by your bed that is easy to reach. Do not use any sheets or blankets that are too big for your bed. They should not hang down onto the floor. Have a firm chair that has side arms. You can use this for support while you get dressed. Do not have throw rugs and other things on the floor that can make you trip. What can I do in the kitchen? Clean up any spills right away. Avoid walking on wet floors. Keep items that you use a lot in easy-to-reach places. If you need to reach something above you, use a strong step stool that has a grab bar. Keep electrical cords out of the way. Do not use floor polish or wax that makes floors slippery. If you must use wax, use non-skid floor wax. Do not have throw rugs and other things on the floor that can make you trip. What can I do with my stairs? Do not leave any  items on the stairs. Make sure that there are handrails on both sides of the stairs and use them. Fix handrails that are broken or loose. Make sure that handrails are as long as the stairways. Check any carpeting to make sure that it is firmly attached to the stairs. Fix any carpet that is loose or worn. Avoid having throw rugs at the top or bottom of the stairs. If you do have throw rugs, attach them to the floor with carpet tape. Make sure that you have a light switch at the top of the stairs and the bottom of the stairs. If you do not have them, ask someone to add them for you. What else can I do to help prevent falls? Wear shoes that: Do not have high heels. Have rubber bottoms. Are comfortable and fit you well. Are closed at the toe. Do not wear sandals. If you use a stepladder: Make sure that it is fully opened. Do not climb a closed stepladder. Make sure that both sides of the stepladder are locked into place. Ask someone to hold it for you, if possible. Clearly mark and make sure that you can see: Any grab bars or handrails. First and last steps. Where the edge of each step is. Use tools that help you move around (mobility aids) if they are needed. These include: Canes. Walkers. Scooters. Crutches. Turn on the lights when you go into a dark area. Replace any light bulbs as soon as they burn out. Set up your furniture so you have a clear path. Avoid moving your furniture around. If any of your floors are uneven, fix them. If there are any pets around you, be aware of where they are. Review your medicines with your doctor. Some medicines can make you feel dizzy. This can increase your chance of falling. Ask your doctor what other things that you can do to help prevent falls. This information is not intended to replace advice given to you by your health care provider. Make sure you discuss any questions you have with your health care provider. Document Released: 01/24/2009  Document Revised: 09/05/2015 Document Reviewed: 05/04/2014 Elsevier Interactive Patient Education  2017 [employer].

## 2022-06-23 ENCOUNTER — Telehealth: Payer: Self-pay | Admitting: Internal Medicine

## 2022-06-23 MED ORDER — FLUCONAZOLE 150 MG PO TABS: 150.0000 mg | ORAL_TABLET | Freq: Once | ORAL | 0 refills | Status: AC

## 2022-06-23 NOTE — Telephone Encounter (Signed)
Patient called and asked for medication because she thinks she is getting a yeast infection. She could not tell me the name of the medication and declined an appointment. She already has an appointment scheduled for 06/30/22, so she may want to discuss it then

## 2022-06-23 NOTE — Telephone Encounter (Signed)
Diflucan sent to pharmacy.

## 2022-06-24 NOTE — Telephone Encounter (Signed)
Notified pt MD sent med to [facility]./lmb

## 2022-06-29 ENCOUNTER — Encounter: Payer: Self-pay | Admitting: Internal Medicine

## 2022-06-29 NOTE — Progress Notes (Unsigned)
Subjective:    Patient ID: Tammy Strickland, female    DOB: [birth year], 61 y.o.   MRN: [id number]     HPI Quintella is here for follow up of her chronic medical problems.  Started mounjaro 4 weeks ago - sugars have been 112-130 recently.  Sugar this am was 184.  She has some constipated-it is minor and she feels she can just adjust what she is eating and it will help.  She is less hungry.  She denies GERD, nausea, diarrhea.    Took high dose vitamin D daily instead of once  a week.    Having vulvar itch - sometimes.  Diflucan did not help.  She denies any dysuria, hematuria.  She is due for gynecological visit this summer  Sees endo 5/29  Medications and allergies reviewed with patient and updated if appropriate.  Current Outpatient Medications on File Prior to Visit  Medication Sig Dispense Refill   Accu-Chek Softclix Lancets lancets ONE EACH BY OTHER ROUTE FOUR TIMES DAILY. 100 each 11   albuterol (VENTOLIN HFA) 108 (90 Base) MCG/ACT inhaler Inhale 2 puffs into the lungs every 6 (six) hours as needed for wheezing or shortness of breath. 8 g 8   amitriptyline (ELAVIL) 75 MG tablet Take 1 tablet by mouth at bedtime 30 tablet 11   ascorbic acid (VITAMIN C) 500 MG tablet Take 1 tablet (500 mg total) by mouth daily. 7 tablet 0   aspirin EC 81 MG tablet Take 1 tablet (81 mg total) by mouth daily.     atorvastatin (LIPITOR) 20 MG tablet Take 1 tablet by mouth every day 30 tablet 11   busPIRone (BUSPAR) 5 MG tablet TAKE 1 TABLET(5 MG) BY MOUTH THREE TIMES DAILY 90 tablet 1   Cholecalciferol (VITAMIN D3) 50 MCG (2000 UT) capsule Take 1 capsule (2,000 Units total) by mouth daily. 90 capsule 1   cloNIDine (CATAPRES) 0.3 MG tablet Take 1 tablet by mouth 3 times a day 90 tablet 11   cyclobenzaprine (FEXMID) 7.5 MG tablet Take 7.5 mg by mouth 3 (three) times daily.     diclofenac (CATAFLAM) 50 MG tablet Take by mouth.     DULoxetine (CYMBALTA) 60 MG capsule Take 60 mg by mouth daily.      fluticasone (FLONASE) 50 MCG/ACT nasal spray Shake liquid & use 1 spray into each nostril every day as needed for allergies 16 g 11   fluticasone furoate-vilanterol (BREO ELLIPTA) 100-25 MCG/INH AEPB Inhale 1 puff into the lungs daily. 1 each 11   furosemide (LASIX) 80 MG tablet Take 1 tablet by mouth every day 30 tablet 11   glucose blood (ACCU-CHEK GUIDE) test strip TEST TWICE DAILY 200 strip 11   hydrOXYzine (ATARAX/VISTARIL) 25 MG tablet Take 25 mg by mouth at bedtime.     insulin glargine, 2 Unit Dial, (TOUJEO MAX SOLOSTAR) 300 UNIT/ML Solostar Pen Inject 180 Units into the skin every morning. And pen needles 1/day (Patient taking differently: Inject 160 Units into the skin every evening. And pen needles 1/day) 60 mL 3   Insulin Pen Needle (PEN NEEDLES) 32G X 5 MM MISC UAD daily for insulin 90 each 3   Iron, Ferrous Sulfate, 325 (65 Fe) MG TABS Take 325 mg by mouth daily. 90 tablet 1   KLOR-CON M10 10 MEQ tablet Take 2 tablets by mouth every day 60 tablet 11   losartan (COZAAR) 50 MG tablet Take 1 tablet by mouth twice daily 60 tablet 11  metoprolol (TOPROL-XL) 200 MG 24 hr tablet Take 1 tablet by mouth twice daily 60 tablet 11   NARCAN 4 MG/0.1ML LIQD nasal spray kit Place 1 spray into the nose once.     nitroGLYCERIN (NITROSTAT) 0.4 MG SL tablet Place 0.4 mg under the tongue every 5 (five) minutes as needed for chest pain.     ondansetron (ZOFRAN) 4 MG tablet Take 4 mg by mouth every 8 (eight) hours as needed.     oxyCODONE-acetaminophen (PERCOCET) 10-325 MG tablet Take 1 tablet by mouth every 6 (six) hours. 120 tablet 0   pantoprazole (PROTONIX) 40 MG tablet Take 1 tablet by mouth twice daily 60 tablet 11   SYMBICORT 80-4.5 MCG/ACT inhaler Inhale 2 puffs into the lungs daily as needed 10.2 each 11   tiZANidine (ZANAFLEX) 4 MG tablet Take 1 tablet (4 mg total) by mouth at bedtime. 90 tablet 1   Vitamin D, Ergocalciferol, (DRISDOL) 1.25 MG (50000 UNIT) CAPS capsule Take 1 capsule (50,000  Units total) by mouth every 7 (seven) days. 8 capsule 0   ZTLIDO 1.8 % PTCH Apply 1 patch topically daily as needed (knee pain).     No current facility-administered medications on file prior to visit.     Review of Systems  Constitutional:  Negative for fever.  Respiratory:  Positive for shortness of breath.   Cardiovascular:  Positive for leg swelling. Negative for chest pain and palpitations.  Gastrointestinal:  Positive for constipation. Negative for abdominal pain and diarrhea.       No gerd   Genitourinary:  Negative for dysuria, frequency and hematuria.       Objective:   Vitals:   06/30/22 0828  BP: 136/84  Pulse: 80  Temp: 98.5 F (36.9 C)  SpO2: 97%   BP Readings from Last 3 Encounters:  06/30/22 136/84  06/02/22 (!) 142/82  03/13/22 136/82   Wt Readings from Last 3 Encounters:  06/30/22 278 lb (126.1 kg)  06/15/22 275 lb (124.7 kg)  06/02/22 279 lb (126.6 kg)   Body mass index is 44.87 kg/m.    Physical Exam Constitutional:      General: She is not in acute distress.    Appearance: Normal appearance.  HENT:     Head: Normocephalic and atraumatic.  Eyes:     Conjunctiva/sclera: Conjunctivae normal.  Cardiovascular:     Rate and Rhythm: Normal rate and regular rhythm.     Heart sounds: Normal heart sounds.  Pulmonary:     Effort: Pulmonary effort is normal. No respiratory distress.     Breath sounds: Normal breath sounds. No wheezing.  Musculoskeletal:     Cervical back: Neck supple.     Right lower leg: Edema (trace) present.     Left lower leg: Edema (trace) present.  Lymphadenopathy:     Cervical: No cervical adenopathy.  Skin:    General: Skin is warm and dry.     Findings: No rash.  Neurological:     Mental Status: She is alert. Mental status is at baseline.  Psychiatric:        Mood and Affect: Mood normal.        Behavior: Behavior normal.        Lab Results  Component Value Date   WBC 16.6 (H) 06/02/2022   HGB 12.7  06/02/2022   HCT 39.3 06/02/2022   PLT 340.0 06/02/2022   GLUCOSE 239 (H) 06/02/2022   CHOL 149 06/02/2022   TRIG 112.0 06/02/2022   HDL 65.20  06/02/2022   LDLDIRECT 101.0 09/19/2019   LDLCALC 61 06/02/2022   ALT 17 06/02/2022   AST 17 06/02/2022   NA 134 (L) 06/02/2022   K 4.3 06/02/2022   CL 97 06/02/2022   CREATININE 0.71 06/02/2022   BUN 15 06/02/2022   CO2 27 06/02/2022   TSH 5.02 01/07/2021   INR 1.06 11/07/2015   HGBA1C 9.1 (H) 06/02/2022   MICROALBUR 2.5 (H) 06/02/2022     Assessment & Plan:    See Problem List for Assessment and Plan of chronic medical problems.

## 2022-06-29 NOTE — Patient Instructions (Signed)
      Blood work was ordered.   The lab is on the first floor.    Medications changes include :       A referral was ordered for XXX.     Someone will call you to schedule an appointment.    No follow-ups on file.  

## 2022-06-30 ENCOUNTER — Telehealth: Payer: Self-pay | Admitting: Internal Medicine

## 2022-06-30 ENCOUNTER — Ambulatory Visit (INDEPENDENT_AMBULATORY_CARE_PROVIDER_SITE_OTHER): Payer: 59 | Admitting: Internal Medicine

## 2022-06-30 VITALS — BP 136/84 | HR 80 | Temp 98.5°F | Ht 66.0 in | Wt 278.0 lb

## 2022-06-30 DIAGNOSIS — I10 Essential (primary) hypertension: Secondary | ICD-10-CM | POA: Diagnosis not present

## 2022-06-30 DIAGNOSIS — L292 Pruritus vulvae: Secondary | ICD-10-CM | POA: Diagnosis not present

## 2022-06-30 DIAGNOSIS — E1142 Type 2 diabetes mellitus with diabetic polyneuropathy: Secondary | ICD-10-CM

## 2022-06-30 DIAGNOSIS — Z794 Long term (current) use of insulin: Secondary | ICD-10-CM | POA: Diagnosis not present

## 2022-06-30 DIAGNOSIS — F17210 Nicotine dependence, cigarettes, uncomplicated: Secondary | ICD-10-CM

## 2022-06-30 MED ORDER — TIRZEPATIDE 5 MG/0.5ML ~~LOC~~ SOAJ: 5.0000 mg | SUBCUTANEOUS | 0 refills | Status: DC

## 2022-06-30 MED ORDER — OXYCODONE-ACETAMINOPHEN 10-325 MG PO TABS: 1.0000 | ORAL_TABLET | Freq: Four times a day (QID) | ORAL | 0 refills | Status: DC

## 2022-06-30 NOTE — Assessment & Plan Note (Signed)
Chronic She states today is the day that she plans on quitting-she knows how important it is to quit and feels that she can do it

## 2022-06-30 NOTE — Telephone Encounter (Signed)
Patient was seen today and said that her pain medication did not get sent in.  Please send as soon as possible

## 2022-06-30 NOTE — Assessment & Plan Note (Signed)
Subacute No improvement with Diflucan Urine dip here negative Possible lichen sclerosis She will schedule an appointment with her gynecologist

## 2022-06-30 NOTE — Assessment & Plan Note (Signed)
Chronic Sugars have been uncontrolled A1c 9.1% last month Started Mounjaro 2.5 mg weekly and this has helped with appetite and her sugars are much better She is adjusting her insulin herself Increase Mounjaro to 5 mg weekly Stressed that she needs to be eating protein, vegetables-low carbs and sugars.  Stressed that she needs to be eating regularly Follow-up in 6 weeks-will recheck A1c at that time-hopefully will be low enough that she can have her knee replaced To see endocrine 5/29

## 2022-06-30 NOTE — Assessment & Plan Note (Signed)
Chronic Blood pressure adequately controlled Continue losartan 50 mg twice daily, metoprolol XL 200 mg twice daily

## 2022-07-07 ENCOUNTER — Encounter: Payer: Self-pay | Admitting: Physical Medicine and Rehabilitation

## 2022-07-10 ENCOUNTER — Other Ambulatory Visit: Payer: Self-pay | Admitting: Otolaryngology

## 2022-07-10 ENCOUNTER — Other Ambulatory Visit: Payer: 59

## 2022-07-10 DIAGNOSIS — E041 Nontoxic single thyroid nodule: Secondary | ICD-10-CM

## 2022-07-13 ENCOUNTER — Telehealth: Payer: Self-pay | Admitting: Internal Medicine

## 2022-07-13 NOTE — Telephone Encounter (Signed)
PT calls today in regards to getting a prescription sent in to treat a boil/cyst in her groin. PT states that this has been causing issues with her walking and moving around. I did offer PT the option of trying to get set up with a virtual appointment or reaching out to one of the urgent care offices to see if they would be able to offer a virtual. PT declined but stated if necessary she would reach out to urgent care (she was just afraid of going to the hospital).  She said in the past Dr.Burns had prescribed a trimethoprim/Sulfamethoxazole (BACTRIM). PT was last seen by [place] 03/19.  If something could be sent in she was requesting it to be sent to  [facility], [place] - [address] AT [place]  .

## 2022-07-14 MED ORDER — SULFAMETHOXAZOLE-TRIMETHOPRIM 800-160 MG PO TABS: 1.0000 | ORAL_TABLET | Freq: Two times a day (BID) | ORAL | 0 refills | Status: AC

## 2022-07-14 NOTE — Telephone Encounter (Signed)
Message left for patient today. 

## 2022-07-14 NOTE — Telephone Encounter (Signed)
Prescription sent to [facility]

## 2022-07-18 ENCOUNTER — Other Ambulatory Visit: Payer: Self-pay | Admitting: Internal Medicine

## 2022-07-29 ENCOUNTER — Other Ambulatory Visit: Payer: Self-pay | Admitting: Internal Medicine

## 2022-07-29 ENCOUNTER — Encounter: Payer: 59 | Attending: Physical Medicine and Rehabilitation | Admitting: Physical Medicine and Rehabilitation

## 2022-07-29 VITALS — BP 127/90 | HR 65 | Ht 66.0 in | Wt 283.0 lb

## 2022-07-29 DIAGNOSIS — M5136 Other intervertebral disc degeneration, lumbar region: Secondary | ICD-10-CM | POA: Diagnosis not present

## 2022-07-29 DIAGNOSIS — M25511 Pain in right shoulder: Secondary | ICD-10-CM | POA: Diagnosis not present

## 2022-07-29 DIAGNOSIS — Z5181 Encounter for therapeutic drug level monitoring: Secondary | ICD-10-CM | POA: Diagnosis not present

## 2022-07-29 DIAGNOSIS — M1711 Unilateral primary osteoarthritis, right knee: Secondary | ICD-10-CM

## 2022-07-29 DIAGNOSIS — Z79891 Long term (current) use of opiate analgesic: Secondary | ICD-10-CM

## 2022-07-29 DIAGNOSIS — G894 Chronic pain syndrome: Secondary | ICD-10-CM | POA: Diagnosis not present

## 2022-07-29 DIAGNOSIS — G479 Sleep disorder, unspecified: Secondary | ICD-10-CM

## 2022-07-29 MED ORDER — OXYCODONE-ACETAMINOPHEN 10-325 MG PO TABS: 1.0000 | ORAL_TABLET | Freq: Four times a day (QID) | ORAL | 0 refills | Status: DC

## 2022-07-29 NOTE — Progress Notes (Addendum)
Subjective:      Subjective  Patient ID: Tammy Strickland, female    DOB: 1961/03/10, 62 y.o.   MRN: [id number]   HPI   HPI   Tammy Strickland is a 62 y.o. year old female  who  has a past medical history of Ambulates with cane (03/13/2021), Anxiety, Arthritis, Asthma, CHF (congestive heart failure) (HCC), COPD (chronic obstructive pulmonary disease) (HCC), Coronary artery disease, Depression, Diabetes mellitus without complication (HCC), Fibromyalgia, Gallstones, GERD (gastroesophageal reflux disease), HLD (hyperlipidemia), Hypertension, Lupus (HCC), Migraines, Myocardial infarction (HCC) (2017), Neuromuscular disorder (HCC), Osteoporosis, Pancreatitis (05/27/2019), Peripheral neuropathy (03/13/2021), Shingles, Sleep apnea, Stroke (HCC) (10/2015), Uses walker (03/13/2021), and Wears glasses.   They are presenting to PM&R clinic as a new patient for pain management evaluation. They were referred by Dr. Leitha Schuller for treatment of Chronic knee pain, chronic pain syndrome, chronic lower back pain.    Per their note:  "Chronic lower back pain and chronic bilateral knee pain Has been following with pain management for years Is no longer seeing her previous pain management because they were making her wait outside in the cold standing for an hour or so before it was time for her appointment-she does not drive and would have to get dropped off Currently taking oxycodone-acetaminophen 10-325 mg 1 tab every 6 hours as needed-prescribed by pain management Also taking tizanidine 4 mg nightly and amitriptyline 75 mg nightly, both of which I am prescribing Referral order for new pain management I will give her a prescription for a month or 2 at most, but I cannot do her pain management long-term"   Source: Primarily right shoulder, both knees, and localized low back pain with radiating. Used ot have L sided sciatica but it improved after her knee surgery.    Inciting incident: None Description of pain:  constant, aching, and and throbbing in her low back Red flag symptoms: No red flags for back pain endorsed in Hx or ROS   Medications tried: Topical medications (moderate effect) : Uses lidocaine patches on her knees and legs BID Nsaids (moderate effect) : Takes 2 aleves and 2 ibuprofen PRN for headaches; takes rarely, hasn't needed for awhile Tylenol  (no effect) :  Opiates  (moderate effect) :  Percocet 10 mg #120 tabs for 1 month, last prescribed 07/03/22. Has been on for a long time. Will take 4-5x per day; says days where it is cloudy or raining "all she can do is lay in bed and cry".   Had difficulty breathing with discussion of buprenorphine patch in pain clinic with [facility] 7/8/2.    Has tried other higher pain regimens in the past but didn't tolerate them well. Roxicodone made her shake. Morphine made her sweat and shake.    Gabapentin / Lyrica  (no effect) : Gabapentin in the past, she was taken off of it because it made her sick.    TCAs  (moderate effect) : Takes 75 mg amytriptilline QHS, was prior on 100 mg with good benefit and is unsure why she was taken down. This helped her sleep considerably.    SNRIs  ( side effects ) : Tried cymbalta, destroyed her sex drive.    Other  (moderate effect) : Tizanidine on 2 tablets daily, but wants to go up on this.,    Other treatments: PT/OT  (excellent effect) : Has been to PT a few times and "loves it"; she continues HEP and is anticipating returning to therapies after knee surgery.  Accupuncture/chiropractor/massage  (excellent effect) : "You don't know how bad I want to do accupuncture"; did once with her daughter. Did on back, neck, and legs.    TENs unit (mild effect) : Uses at home; "eases me off" pain. Hasn't used in 6 years.    Injections (mild effect) : "Used to get 6 injections in my back every 3 weeks", would last about 12 days ; hasn't had because she left Dr. Eliberto Ivory office for [facility] due to L knee pain.     Surgery (moderate effect) : Pending L TKA revision and R TKA; seeing ortho next week.    Goals for pain control: Once everything is good, I would love to garden. That's what I like to do. I would like to play with my grandkids. I'd like to be able to swim again.    Prior UDS results:  Labs (Brief)  No results found for: "LABOPIA", "COCAINSCRNUR", "LABBENZ", "AMPHETMU", "THCU", "LABBARB"     Pain Inventory Average Pain 10 Pain Right Now 10 My pain is sharp, burning, dull, stabbing, tingling, and aching   In the last 24 hours, has pain interfered with the following? General activity 9 Relation with others 8 Enjoyment of life 10 What TIME of day is your pain at its worst? daytime, evening, and night Sleep (in general) Poor   Pain is worse with: walking, bending, inactivity, standing, and some activites Pain improves with: rest, heat/ice, medication, and injections Relief from Meds: 7   walk with assistance use a cane use a walker how many minutes can you walk? 5 min ability to climb steps?  yes do you drive?  no needs help with transfers Do you have any goals in this area?  yes   disabled: date disabled 2005 I need assistance with the following:  dressing, bathing, household duties, and shopping Do you have any goals in this area?  yes   weakness numbness tremor tingling trouble walking spasms dizziness anxiety   CT/MRI   New Patient            Family History  Problem Relation Age of Onset   Hyperlipidemia Mother     Hypertension Mother     Stroke Mother     Thyroid disease Mother     Heart attack Mother     Hyperlipidemia Father     Hypertension Father     Stroke Father     Heart attack Father     Hypertension Sister     Stroke Sister     Thyroid disease Sister     Breast cancer Sister     Crohn's disease Sister     Hypertension Sister     Hypertension Brother     Diabetes Brother     Hypertension Brother      Social History          Socioeconomic History   Marital status: Single      Spouse name: n/a   Number of children: 3   Years of education: 12+   Highest education level: Not on file  Occupational History   Occupation: disabled-falling, doesn't recall name of toxin      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  Tobacco Use   Smoking status: Every Day      Packs/day: 0.50      Years: 35.00      Additional pack years: 0.00      Total pack years: 17.50      Types: Cigarettes   Smokeless tobacco: Never  Tobacco comments:      referred  to smoking  cessation  classes at  [facility]   Vaping Use   Vaping Use: Never used  Substance and Sexual Activity   Alcohol use: Yes      Comment: occasional   Drug use: No   Sexual activity: Not Currently      Partners: Female      Birth control/protection: Surgical      Comment: hysterectomy  Other Topics Concern   Not on file  Social History Narrative    Moved to [place] from [place], [place] February 2017, to help her daughter.    Lives with her daughter.    Sons live in [place] and [place].    She reports that there were originally 17 children in her family (she is the youngest), the oldest are deceased, some prior to her birth, and she isn't sure which were female/female or how they died.    Social Determinants of Health        Financial Resource Strain: Low Risk  (06/15/2022)    Overall Financial Resource Strain (CARDIA)     Difficulty of Paying Living Expenses: Not hard at all  Food Insecurity: No Food Insecurity (06/15/2022)    Hunger Vital Sign     Worried About Running Out of Food in the Last Year: Never true     Ran Out of Food in the Last Year: Never true  Transportation Needs: No Transportation Needs (06/15/2022)    PRAPARE - Therapist, art (Medical): No     Lack of Transportation (Non-Medical): No  Physical Activity: Inactive (06/15/2022)    Exercise Vital Sign     Days of Exercise per Week: 0 days     Minutes of  Exercise per Session: 0 min  Stress: No Stress Concern Present (06/15/2022)    [employer] of Occupational Health - Occupational Stress Questionnaire     Feeling of Stress : Not at all  Social Connections: Socially Isolated (06/15/2022)    Social Connection and Isolation Panel [NHANES]     Frequency of Communication with Friends and Family: More than three times a week     Frequency of Social Gatherings with Friends and Family: Once a week     Attends Religious Services: Never     Database administrator or Organizations: No     Attends Engineer, structural: Never     Marital Status: Never married         Past Surgical History:  Procedure Laterality Date   ABDOMINAL HYSTERECTOMY   1989    ovaries left   APPENDECTOMY   1989   BREAST LUMPECTOMY WITH RADIOACTIVE SEED LOCALIZATION Left 06/14/2019    Procedure: LEFT BREAST LUMPECTOMY WITH RADIOACTIVE SEED LOCALIZATION X 2;  Surgeon: Abigail Miyamoto, MD;  Location: [facility];  Service: General;  Laterality: Left;  LMA   CHOLECYSTECTOMY   1989   IR GENERIC HISTORICAL   11/07/2015    IR ANGIO INTRA EXTRACRAN SEL COM CAROTID INNOMINATE BILAT MOD SED 11/07/2015 Julieanne Cotton, MD [facility]   IR GENERIC HISTORICAL   11/07/2015    IR ANGIO VERTEBRAL SEL SUBCLAVIAN INNOMINATE UNI R MOD SED 11/07/2015 Julieanne Cotton, MD [facility]   IR GENERIC HISTORICAL   11/07/2015    IR ANGIO VERTEBRAL SEL VERTEBRAL UNI L MOD SED 11/07/2015 Julieanne Cotton, MD [facility]   IR GENERIC HISTORICAL   11/07/2015    IR ANGIOGRAM EXTREMITY LEFT 11/07/2015  Julieanne Cotton, MD [facility]   MASS EXCISION Left 03/17/2021    Procedure: EXCISION MASS,left middle finger ulnar border mass excision,left thumb volar retinacular ganglion cyst excision;  Surgeon: Gomez Cleverly, MD;  Location: [facility] ;  Service: Orthopedics;  Laterality: Left;   TONSILLECTOMY   2000    adenoids removed   TOTAL KNEE ARTHROPLASTY Left 03/12/2017     Procedure: LEFT TOTAL KNEE ARTHROPLASTY;  Surgeon: Kathryne Hitch, MD;  Location: [facility];  Service: Orthopedics;  Laterality: Left;  Adductor Block   TRIGGER FINGER RELEASE Left 03/17/2021    Procedure: left trigger thumb release;  Surgeon: Gomez Cleverly, MD;  Location: [facility];  Service: Orthopedics;  Laterality: Left;  with local anesthesia        Past Medical History:  Diagnosis Date   Ambulates with cane 03/13/2021   Anxiety     Arthritis     Asthma     CHF (congestive heart failure) (HCC)     COPD (chronic obstructive pulmonary disease) (HCC)     Coronary artery disease     Depression     Diabetes mellitus without complication (HCC)      type 2    Fibromyalgia     Gallstones     GERD (gastroesophageal reflux disease)     HLD (hyperlipidemia)     Hypertension     Lupus (HCC)     Migraines     Myocardial infarction (HCC) 2017   Neuromuscular disorder (HCC)     Osteoporosis     Pancreatitis 05/27/2019    none since   Peripheral neuropathy 03/13/2021    all over pt pt   Shingles     Sleep apnea      sleep study 03-03-2017 mild osa no cpap used per pt   Stroke (HCC) 10/2015    no residual from   Uses walker 03/13/2021   Wears glasses      There were no vitals taken for this visit.   Opioid Risk Score:   Fall Risk Score:  `1   Depression screen [facility] 2/9       06/30/2022    8:37 AM 06/15/2022    8:55 AM 03/13/2022    9:21 AM 03/13/2022    8:01 AM 12/03/2021   11:49 AM 10/08/2021    7:56 AM 06/13/2021    8:48 AM  Depression screen PHQ 2/9  Decreased Interest 0 0 0 0 0 1 0  Down, Depressed, Hopeless 0 0 0 0 0 0 0  PHQ - 2 Score 0 0 0 0 0 1 0  Altered sleeping     Tired, decreased energy     0    Change in appetite     0 0 0 0    Feeling bad or failure about yourself      0 0 0 0    Trouble concentrating     0 0 0 0    Moving slowly or fidgety/restless     0 0 0 1    Suicidal thoughts     0 0 0 0    PHQ-9 Score     Difficult doing work/chores     Not difficult at all Not difficult at all Not difficult at all Very difficult          Review of Systems  Musculoskeletal:  Positive for  back pain.       B//L knee leg pain LT shoulder pain  All other systems reviewed and are negative.         Objective:    Objective [] Expand by Default Physical Exam     PE: Constitution: Appropriate appearance for age. No apparent distress. +Obese Resp: No respiratory distress. No accessory muscle usage. on RA and CTAB Cardio: Well perfused appearance. No peripheral edema. Abdomen: Nondistended. Nontender.   Psych: Appropriate mood and affect. Neuro: AAOx4. No apparent cognitive deficits    Neurologic Exam:   DTRs: Reflexes were 2+ in bilateral achilles, patella, biceps, BR and triceps. Babinsky: flexor responses b/l.   Hoffmans: negative b/l Sensory exam: revealed normal sensation in all dermatomal regions in bilateral lower extremities Motor exam: strength 5/5 throughout bilateral lower extremities Coordination: Fine motor coordination was normal.   Gait: +antalgic gait   Knees: +TTP lateral right knee joint, medial joint, and posterior fossa. +Fullness on R posterior knee. No TTP L knee.  AROM limited in R knee extension d/t pain; PROM complete Negative anterior drawer, posterior drawer, and lateral/medial stress for increased laxity   Back:  Negative slump, forward bending + Facet loading b/l         Assessment & Plan:    Tammy Strickland is a 62 y.o. year old female  who  has a past medical history of Ambulates with cane (03/13/2021), Anxiety, Arthritis, Asthma, CHF (congestive heart failure) (HCC), COPD (chronic obstructive pulmonary disease) (HCC), Coronary artery disease, Depression, Diabetes mellitus without complication (HCC), Fibromyalgia, Gallstones, GERD (gastroesophageal reflux disease), HLD (hyperlipidemia), Hypertension, Lupus (HCC), Migraines, Myocardial infarction (HCC) (2017),  Neuromuscular disorder (HCC), Osteoporosis, Pancreatitis (05/27/2019), Peripheral neuropathy (03/13/2021), Shingles, Sleep apnea, Stroke (HCC) (10/2015), Uses walker (03/13/2021), and Wears glasses.   They are presenting to PM&R clinic as a new patient for treatment of  right shoulder, b/l knees, and low back pain.    Chronic pain syndrome -     ToxAssure Select,+Antidepr,UR   Today, we had you fill out a pain contract and perform a urine screen.  I will also be reviewing records from your prior pain management office to ensure that there was no history of aberrant urine drug screens or concerns for medication misuse.  I will prescribe your Percocet for 1 month on his next refill, and if all of the above look okay, we can continue from there.  If I find any issues with your history or any concerns with urine screen, we may have to manage your pain without controlled substances.  **Update per chart review** Was prior following with Pain Management at [facility] in [place] with Dr. Tonye Royalty. Visit notes not readable over Epic but appears last visit was 04/2022. Note multiple other evaluations from pain providers, consistently will not follow with clinics if attempted to transition to long-acting medication such as Butrans.   UDS appears appropraite.    Indication for chronic opioid: Chronic pain in low back, R shoulder, bilateral knees Medication and dose: Percocet 10 mg Q6H PRN # pills per month: 120 Last UDS date: 07/29/22 Opioid Treatment Agreement signed (Y/N): 07/29/22 Opioid Treatment Agreement last reviewed with patient:   NCCSRS/PDMP reviewed this encounter (include red flags): Yes    Management will include:  Medium Risk (10-90 MME) UDS every 3-6 months NCCSR check every visit Follow up Q1M initially, Q3M once established     DDD (degenerative disc disease), lumbar Got Multiple injections (?ESI) by Dr. Eliberto Ivory office, without benefit  Will review  records as above from prior pain clinic; may benefit from MBB    Unilateral primary osteoarthritis, right knee Go to your orthopedic appointment, and follow-up with [place] in 1 month.     Approaching surgery and in the immediate postop period when you are doing physical therapy, I am unlikely to change much with your pain regimen.     Once you have finished physical therapy, we may consider aqua therapy, or other therapy modalities to help get you moving.   Right shoulder pain, unspecified chronicity Will re-address next visit due to limited time for evaluation   Difficulty Sleeping For sleep, I want you to pick a time to lay down every night, ideally between 8 and 10 PM.   Starting 1 hour before you want to go to sleep, turn off all television screens, phone screens, tablets, and computers.  Keep the lights low and perform only low stimulation activities, such as reading.   Only use your bedroom for sleep and sex.   You may also take 3 to 5 mg of over-the-counter melatonin approximately 1 hour before bedtime.      Other orders -     oxyCODONE-Acetaminophen; Take 1 tablet by mouth every 6 (six) hours.  Dispense: 120 tablet; Refill: 0

## 2022-07-29 NOTE — Progress Notes (Deleted)
Subjective:    Patient ID: Tammy Strickland, female    DOB: 1961/01/30, 62 y.o.   MRN: [id number]  HPI  HPI  Tammy Strickland is a 62 y.o. year old female  who  has a past medical history of Ambulates with cane (03/13/2021), Anxiety, Arthritis, Asthma, CHF (congestive heart failure) (HCC), COPD (chronic obstructive pulmonary disease) (HCC), Coronary artery disease, Depression, Diabetes mellitus without complication (HCC), Fibromyalgia, Gallstones, GERD (gastroesophageal reflux disease), HLD (hyperlipidemia), Hypertension, Lupus (HCC), Migraines, Myocardial infarction (HCC) (2017), Neuromuscular disorder (HCC), Osteoporosis, Pancreatitis (05/27/2019), Peripheral neuropathy (03/13/2021), Shingles, Sleep apnea, Stroke (HCC) (10/2015), Uses walker (03/13/2021), and Wears glasses.   They are presenting to PM&R clinic as a new patient for pain management evaluation. They were referred by Dr. Leitha Schuller for treatment of Chronic knee pain, chronic pain syndrome, chronic lower back pain.   Per their note:  "Chronic lower back pain and chronic bilateral knee pain Has been following with pain management for years Is no longer seeing her previous pain management because they were making her wait outside in the cold standing for an hour or so before it was time for her appointment-she does not drive and would have to get dropped off Currently taking oxycodone-acetaminophen 10-325 mg 1 tab every 6 hours as needed-prescribed by pain management Also taking tizanidine 4 mg nightly and amitriptyline 75 mg nightly, both of which I am prescribing Referral order for new pain management I will give her a prescription for a month or 2 at most, but I cannot do her pain management long-term"  Source: Primarily right shoulder, both knees, and localized low back pain with radiating. Used ot have L sided sciatica but it improved after her knee surgery.   Inciting incident: None Description of pain: constant, aching, and  and throbbing in her low back Red flag symptoms: No red flags for back pain endorsed in Hx or ROS  Medications tried: Topical medications (moderate effect) : Uses lidocaine patches on her knees and legs BID Nsaids (moderate effect) : Takes 2 aleves and 2 ibuprofen PRN for headaches; takes rarely, hasn't needed for awhile Tylenol  (no effect) :  Opiates  (moderate effect) :  Percocet 10 mg #120 tabs for 1 month, last prescribed 07/03/22. Has been on for a long time. Will take 4-5x per day; says days where it is cloudy or raining "all she can do is lay in bed and cry".  Had difficulty breathing with discussion of buprenorphine patch in pain clinic with [facility] 7/8/2.   Has tried other higher pain regimens in the past but didn't tolerate them well. Roxicodone made her shake. Morphine made her sweat and shake.   Gabapentin / Lyrica  (no effect) : Gabapentin in the past, she was taken off of it because it made her sick.   TCAs  (moderate effect) : Takes 75 mg amytriptilline QHS, was prior on 100 mg with good benefit and is unsure why she was taken down. This helped her sleep considerably.   SNRIs  ( side effects ) : Tried cymbalta, destroyed her sex drive.   Other  (moderate effect) : Tizanidine on 2 tablets daily, but wants to go up on this.,   Other treatments: PT/OT  (excellent effect) : Has been to PT a few times and "loves it"; she continues HEP and is anticipating returning to therapies after knee surgery.   Accupuncture/chiropractor/massage  (excellent effect) : "You don't know how bad I want to do accupuncture"; did once with her  daughter. Did on back, neck, and legs.   TENs unit (mild effect) : Uses at home; "eases me off" pain. Hasn't used in 6 years.   Injections (mild effect) : "Used to get 6 injections in my back every 3 weeks", would last about 12 days ; hasn't had because she left Dr. Eliberto Ivory office for [facility] due to L knee pain.   Surgery (moderate effect) :  Pending L TKA revision and R TKA; seeing ortho next week.   Goals for pain control: Once everything is good, I would love to garden. That's what I like to do. I would like to play with my grandkids. I'd like to be able to swim again.   Prior UDS results: No results found for: "LABOPIA", "COCAINSCRNUR", "LABBENZ", "AMPHETMU", "THCU", "LABBARB"   Pain Inventory Average Pain 10 Pain Right Now 10 My pain is sharp, burning, dull, stabbing, tingling, and aching  In the last 24 hours, has pain interfered with the following? General activity 9 Relation with others 8 Enjoyment of life 10 What TIME of day is your pain at its worst? daytime, evening, and night Sleep (in general) Poor  Pain is worse with: walking, bending, inactivity, standing, and some activites Pain improves with: rest, heat/ice, medication, and injections Relief from Meds: 7  walk with assistance use a cane use a walker how many minutes can you walk? 5 min ability to climb steps?  yes do you drive?  no needs help with transfers Do you have any goals in this area?  yes  disabled: date disabled 2005 I need assistance with the following:  dressing, bathing, household duties, and shopping Do you have any goals in this area?  yes  weakness numbness tremor tingling trouble walking spasms dizziness anxiety  CT/MRI  New Patient    Family History  Problem Relation Age of Onset   Hyperlipidemia Mother    Hypertension Mother    Stroke Mother    Thyroid disease Mother    Heart attack Mother    Hyperlipidemia Father    Hypertension Father    Stroke Father    Heart attack Father    Hypertension Sister    Stroke Sister    Thyroid disease Sister    Breast cancer Sister    Crohn's disease Sister    Hypertension Sister    Hypertension Brother    Diabetes Brother    Hypertension Brother    Social History   Socioeconomic History   Marital status: Single    Spouse name: n/a   Number of children: 3    Years of education: 12+   Highest education level: Not on file  Occupational History   Occupation: disabled-falling, doesn't recall name of toxin    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  Tobacco Use   Smoking status: Every Day    Packs/day: 0.50    Years: 35.00    Additional pack years: 0.00    Total pack years: 17.50    Types: Cigarettes   Smokeless tobacco: Never   Tobacco comments:    referred  to smoking  cessation  classes at  [employer] Use   Vaping Use: Never used  Substance and Sexual Activity   Alcohol use: Yes    Comment: occasional   Drug use: No   Sexual activity: Not Currently    Partners: Female    Birth control/protection: Surgical    Comment: hysterectomy  Other Topics Concern   Not on file  Social History Narrative  Moved to [place] from [place], [place] February 2017, to help her daughter.   Lives with her daughter.   Sons live in [place] and [place].   She reports that there were originally 17 children in her family (she is the youngest), the oldest are deceased, some prior to her birth, and she isn't sure which were female/female or how they died.   Social Determinants of Health   Financial Resource Strain: Low Risk  (06/15/2022)   Overall Financial Resource Strain (CARDIA)    Difficulty of Paying Living Expenses: Not hard at all  Food Insecurity: No Food Insecurity (06/15/2022)   Hunger Vital Sign    Worried About Running Out of Food in the Last Year: Never true    Ran Out of Food in the Last Year: Never true  Transportation Needs: No Transportation Needs (06/15/2022)   PRAPARE - Administrator, Civil Service (Medical): No    Lack of Transportation (Non-Medical): No  Physical Activity: Inactive (06/15/2022)   Exercise Vital Sign    Days of Exercise per Week: 0 days    Minutes of Exercise per Session: 0 min  Stress: No Stress Concern Present (06/15/2022)   [employer] of Occupational Health - Occupational Stress  Questionnaire    Feeling of Stress : Not at all  Social Connections: Socially Isolated (06/15/2022)   Social Connection and Isolation Panel [NHANES]    Frequency of Communication with Friends and Family: More than three times a week    Frequency of Social Gatherings with Friends and Family: Once a week    Attends Religious Services: Never    Database administrator or Organizations: No    Attends Engineer, structural: Never    Marital Status: Never married   Past Surgical History:  Procedure Laterality Date   ABDOMINAL HYSTERECTOMY  1989   ovaries left   APPENDECTOMY  1989   BREAST LUMPECTOMY WITH RADIOACTIVE SEED LOCALIZATION Left 06/14/2019   Procedure: LEFT BREAST LUMPECTOMY WITH RADIOACTIVE SEED LOCALIZATION X 2;  Surgeon: Abigail Miyamoto, MD;  Location: [facility];  Service: General;  Laterality: Left;  LMA   CHOLECYSTECTOMY  1989   IR GENERIC HISTORICAL  11/07/2015   IR ANGIO INTRA EXTRACRAN SEL COM CAROTID INNOMINATE BILAT MOD SED 11/07/2015 Julieanne Cotton, MD [facility]   IR GENERIC HISTORICAL  11/07/2015   IR ANGIO VERTEBRAL SEL SUBCLAVIAN INNOMINATE UNI R MOD SED 11/07/2015 Julieanne Cotton, MD [facility]   IR GENERIC HISTORICAL  11/07/2015   IR ANGIO VERTEBRAL SEL VERTEBRAL UNI L MOD SED 11/07/2015 Julieanne Cotton, MD [facility]   IR GENERIC HISTORICAL  11/07/2015   IR ANGIOGRAM EXTREMITY LEFT 11/07/2015 Julieanne Cotton, MD [facility]   MASS EXCISION Left 03/17/2021   Procedure: EXCISION MASS,left middle finger ulnar border mass excision,left thumb volar retinacular ganglion cyst excision;  Surgeon: Gomez Cleverly, MD;  Location: [facility];  Service: Orthopedics;  Laterality: Left;   TONSILLECTOMY  2000   adenoids removed   TOTAL KNEE ARTHROPLASTY Left 03/12/2017   Procedure: LEFT TOTAL KNEE ARTHROPLASTY;  Surgeon: Kathryne Hitch, MD;  Location: [facility];  Service: Orthopedics;  Laterality: Left;  Adductor Block   TRIGGER FINGER  RELEASE Left 03/17/2021   Procedure: left trigger thumb release;  Surgeon: Gomez Cleverly, MD;  Location: [facility];  Service: Orthopedics;  Laterality: Left;  with local anesthesia   Past Medical History:  Diagnosis Date   Ambulates with cane 03/13/2021   Anxiety  Arthritis    Asthma    CHF (congestive heart failure) (HCC)    COPD (chronic obstructive pulmonary disease) (HCC)    Coronary artery disease    Depression    Diabetes mellitus without complication (HCC)    type 2    Fibromyalgia    Gallstones    GERD (gastroesophageal reflux disease)    HLD (hyperlipidemia)    Hypertension    Lupus (HCC)    Migraines    Myocardial infarction (HCC) 2017   Neuromuscular disorder (HCC)    Osteoporosis    Pancreatitis 05/27/2019   none since   Peripheral neuropathy 03/13/2021   all over pt pt   Shingles    Sleep apnea    sleep study 03-03-2017 mild osa no cpap used per pt   Stroke (HCC) 10/2015   no residual from   Uses walker 03/13/2021   Wears glasses    There were no vitals taken for this visit.  Opioid Risk Score:   Fall Risk Score:  `1  Depression screen [facility] 2/9     06/30/2022    8:37 AM 06/15/2022    8:55 AM 03/13/2022    9:21 AM 03/13/2022    8:01 AM 12/03/2021   11:49 AM 10/08/2021    7:56 AM 06/13/2021    8:48 AM  Depression screen PHQ 2/9  Decreased Interest 0 0 0 0 0 1 0  Down, Depressed, Hopeless 0 0 0 0 0 0 0  PHQ - 2 Score 0 0 0 0 0 1 0  Altered sleeping   Tired, decreased energy   0   Change in appetite   0 0 0 0   Feeling bad or failure about yourself    0 0 0 0   Trouble concentrating   0 0 0 0   Moving slowly or fidgety/restless   0 0 0 1   Suicidal thoughts   0 0 0 0   PHQ-9 Score   Difficult doing work/chores   Not difficult at all Not difficult at all Not difficult at all Very difficult       Review of Systems  Musculoskeletal:  Positive for back pain.       B//L knee leg pain LT shoulder pain  All  other systems reviewed and are negative.      Objective:   Physical Exam   PE: Constitution: Appropriate appearance for age. No apparent distress. +Obese Resp: No respiratory distress. No accessory muscle usage. on RA and CTAB Cardio: Well perfused appearance. No peripheral edema. Abdomen: Nondistended. Nontender.   Psych: Appropriate mood and affect. Neuro: AAOx4. No apparent cognitive deficits   Neurologic Exam:   DTRs: Reflexes were 2+ in bilateral achilles, patella, biceps, BR and triceps. Babinsky: flexor responses b/l.   Hoffmans: negative b/l Sensory exam: revealed normal sensation in all dermatomal regions in bilateral lower extremities Motor exam: strength 5/5 throughout bilateral lower extremities Coordination: Fine motor coordination was normal.   Gait: +antalgic gait  Knees: +TTP lateral right knee joint, medial joint, and posterior fossa. +Fullness on R posterior knee. No TTP L knee.  AROM limited in R knee extension d/t pain; PROM complete Negative anterior drawer, posterior drawer, and lateral/medial stress for increased laxity  Back:  Negative slump, forward bending + Facet loading b/l      Assessment & Plan:   Hudsyn Barich is a 62 y.o. year old female  who  has a past medical history of Ambulates with cane (03/13/2021), Anxiety, Arthritis, Asthma, CHF (congestive heart failure) (HCC), COPD (chronic obstructive pulmonary disease) (HCC), Coronary artery disease, Depression, Diabetes mellitus without complication (HCC), Fibromyalgia, Gallstones, GERD (gastroesophageal reflux disease), HLD (hyperlipidemia), Hypertension, Lupus (HCC), Migraines, Myocardial infarction (HCC) (2017), Neuromuscular disorder (HCC), Osteoporosis, Pancreatitis (05/27/2019), Peripheral neuropathy (03/13/2021), Shingles, Sleep apnea, Stroke (HCC) (10/2015), Uses walker (03/13/2021), and Wears glasses.   They are presenting to PM&R clinic as a new patient for treatment of  right shoulder,  b/l knees, and low back pain.   Chronic pain syndrome -     ToxAssure Select,+Antidepr,UR  Was prior following with Pain Management in [place]  Today, we had you fill out a pain contract and perform a urine screen.  I will also be reviewing records from your prior pain management office to ensure that there was no history of aberrant urine drug screens or concerns for medication misuse.  I will prescribe your Percocet for 1 month on his next refill, and if all of the above look okay, we can continue from there.  If I find any issues with your history or any concerns with urine screen, we may have to manage your pain without controlled substances.  Indication for chronic opioid: Chronic pain in low back, R shoulder, bilateral knees Medication and dose: Percocet 10 mg Q6H PRN # pills per month: 120 Last UDS date: 07/29/22 Opioid Treatment Agreement signed (Y/N): 07/29/22 Opioid Treatment Agreement last reviewed with patient:   NCCSRS/PDMP reviewed this encounter (include red flags): Yes   Management will include:  Medium Risk (10-90 MME) UDS every 3-6 months NCCSR check every visit Follow up Q1M initially, Q3M once established   DDD (degenerative disc disease), lumbar Got Multiple injections (?ESI) by Dr. Eliberto Ivory office, without benefit Will review records as above from prior pain clinic; may benefit from MBB   Unilateral primary osteoarthritis, right knee Go to your orthopedic appointment, and follow-up with [place] in 1 month.    Approaching surgery and in the immediate postop period when you are doing physical therapy, I am unlikely to change much with your pain regimen.    Once you have finished physical therapy, we may consider aqua therapy, or other therapy modalities to help get you moving.  Right shoulder pain, unspecified chronicity Will re-address next visit due to limited time for evaluation  Difficulty Sleeping For sleep, I want you to pick a time to lay down every  night, ideally between 8 and 10 PM.   Starting 1 hour before you want to go to sleep, turn off all television screens, phone screens, tablets, and computers.  Keep the lights low and perform only low stimulation activities, such as reading.   Only use your bedroom for sleep and sex.   You may also take 3 to 5 mg of over-the-counter melatonin approximately 1 hour before bedtime.    Other orders -     oxyCODONE-Acetaminophen; Take 1 tablet by mouth every 6 (six) hours.  Dispense: 120 tablet; Refill: 0

## 2022-07-29 NOTE — Patient Instructions (Addendum)
Today, we had you fill out a pain contract and perform a urine screen.  I will also be reviewing records from your prior pain management office to ensure that there was no history of aberrant urine drug screens or concerns for medication misuse.  I will prescribe your Percocet for 1 month on his next refill, and if all of the above look okay, we can continue from there.  If I find any issues with your history or any concerns with urine screen, we may have to manage your pain without controlled substances.  Go to your orthopedic appointment, and follow-up with [place] in 1 month.  Approaching surgery and in the immediate postop period when you are doing physical therapy, I am unlikely to change much with your pain regimen.  Once you have finished physical therapy, we may consider aqua therapy, or other therapy modalities to help get you moving.   For sleep, I want you to pick a time to lay down every night, ideally between 8 and 10 PM.   Starting 1 hour before you want to go to sleep, turn off all television screens, phone screens, tablets, and computers.  Keep the lights low and perform only low stimulation activities, such as reading.   Only use your bedroom for sleep and sex.   You may also take 3 to 5 mg of over-the-counter melatonin approximately 1 hour before bedtime.

## 2022-07-30 ENCOUNTER — Other Ambulatory Visit: Payer: 59

## 2022-07-30 ENCOUNTER — Inpatient Hospital Stay: Admission: RE | Admit: 2022-07-30 | Payer: 59 | Source: Ambulatory Visit

## 2022-08-02 ENCOUNTER — Other Ambulatory Visit: Payer: Self-pay | Admitting: Internal Medicine

## 2022-08-02 DIAGNOSIS — G479 Sleep disorder, unspecified: Secondary | ICD-10-CM | POA: Insufficient documentation

## 2022-08-02 DIAGNOSIS — M25511 Pain in right shoulder: Secondary | ICD-10-CM | POA: Insufficient documentation

## 2022-08-05 ENCOUNTER — Other Ambulatory Visit: Payer: Self-pay | Admitting: Internal Medicine

## 2022-08-05 LAB — TOXASSURE SELECT,+ANTIDEPR,UR

## 2022-08-10 ENCOUNTER — Other Ambulatory Visit: Payer: Self-pay | Admitting: Internal Medicine

## 2022-08-10 DIAGNOSIS — I10 Essential (primary) hypertension: Secondary | ICD-10-CM

## 2022-08-14 ENCOUNTER — Telehealth: Payer: Self-pay | Admitting: *Deleted

## 2022-08-14 NOTE — Telephone Encounter (Signed)
Urine drug screen for this encounter is positive for alcohol as well as oxycodone and amitriptyline.

## 2022-08-26 ENCOUNTER — Encounter: Payer: 59 | Attending: Physical Medicine and Rehabilitation | Admitting: Physical Medicine and Rehabilitation

## 2022-08-26 ENCOUNTER — Encounter: Payer: Self-pay | Admitting: Physical Medicine and Rehabilitation

## 2022-08-26 VITALS — BP 128/76 | HR 66 | Ht 66.0 in | Wt 288.2 lb

## 2022-08-26 DIAGNOSIS — G479 Sleep disorder, unspecified: Secondary | ICD-10-CM | POA: Insufficient documentation

## 2022-08-26 DIAGNOSIS — G894 Chronic pain syndrome: Secondary | ICD-10-CM | POA: Diagnosis not present

## 2022-08-26 DIAGNOSIS — Z79891 Long term (current) use of opiate analgesic: Secondary | ICD-10-CM | POA: Insufficient documentation

## 2022-08-26 DIAGNOSIS — M7062 Trochanteric bursitis, left hip: Secondary | ICD-10-CM | POA: Insufficient documentation

## 2022-08-26 DIAGNOSIS — M5136 Other intervertebral disc degeneration, lumbar region: Secondary | ICD-10-CM | POA: Insufficient documentation

## 2022-08-26 DIAGNOSIS — Z789 Other specified health status: Secondary | ICD-10-CM | POA: Diagnosis not present

## 2022-08-26 DIAGNOSIS — Z5181 Encounter for therapeutic drug level monitoring: Secondary | ICD-10-CM | POA: Diagnosis not present

## 2022-08-26 DIAGNOSIS — M25512 Pain in left shoulder: Secondary | ICD-10-CM | POA: Diagnosis not present

## 2022-08-26 DIAGNOSIS — G8929 Other chronic pain: Secondary | ICD-10-CM | POA: Insufficient documentation

## 2022-08-26 DIAGNOSIS — M1711 Unilateral primary osteoarthritis, right knee: Secondary | ICD-10-CM | POA: Insufficient documentation

## 2022-08-26 MED ORDER — TIZANIDINE HCL 4 MG PO TABS: 4.0000 mg | ORAL_TABLET | Freq: Two times a day (BID) | ORAL | 5 refills | Status: DC | PRN

## 2022-08-26 MED ORDER — OXYCODONE-ACETAMINOPHEN 10-325 MG PO TABS: 1.0000 | ORAL_TABLET | Freq: Four times a day (QID) | ORAL | 0 refills | Status: AC | PRN

## 2022-08-26 MED ORDER — OXYCODONE-ACETAMINOPHEN 10-325 MG PO TABS: 1.0000 | ORAL_TABLET | Freq: Four times a day (QID) | ORAL | 0 refills | Status: AC

## 2022-08-26 MED ORDER — OXYCODONE-ACETAMINOPHEN 10-325 MG PO TABS: 1.0000 | ORAL_TABLET | Freq: Four times a day (QID) | ORAL | 0 refills | Status: DC | PRN

## 2022-08-26 NOTE — Progress Notes (Signed)
Subjective:    Patient ID: Tammy Strickland, female    DOB: [birth year], 62 y.o.   MRN: [id number]  HPI  Tammy Strickland is a 62 y.o. year old female  who  has a past medical history of Ambulates with cane (03/13/2021), Anxiety, Arthritis, Asthma, CHF (congestive heart failure) (HCC), COPD (chronic obstructive pulmonary disease) (HCC), Coronary artery disease, Depression, Diabetes mellitus without complication (HCC), Fibromyalgia, Gallstones, GERD (gastroesophageal reflux disease), HLD (hyperlipidemia), Hypertension, Lupus (HCC), Migraines, Myocardial infarction (HCC) (2017), Neuromuscular disorder (HCC), Osteoporosis, Pancreatitis (05/27/2019), Peripheral neuropathy (03/13/2021), Shingles, Sleep apnea, Stroke (HCC) (10/2015), Uses walker (03/13/2021), and Wears glasses.   They are presenting to PM&R clinic for follow up related to chronic pain .  Plan from last visit:  Chronic pain syndrome -     ToxAssure Select,+Antidepr,UR   Today, we had you fill out a pain contract and perform a urine screen.  I will also be reviewing records from your prior pain management office to ensure that there was no history of aberrant urine drug screens or concerns for medication misuse.  I will prescribe your Percocet for 1 month on his next refill, and if all of the above look okay, we can continue from there.  If I find any issues with your history or any concerns with urine screen, we may have to manage your pain without controlled substances.   **Update per chart review** Was prior following with Pain Management at [facility] in [place] with Dr. Tonye Royalty. Visit notes not readable over Epic but appears last visit was 04/2022. Note multiple other evaluations from pain providers, consistently will not follow with clinics if attempted to transition to long-acting medication such as Butrans.    UDS appears appropraite.    Indication for chronic opioid: Chronic pain in low back, R  shoulder, bilateral knees Medication and dose: Percocet 10 mg Q6H PRN # pills per month: 120 Last UDS date: 07/29/22 Opioid Treatment Agreement signed (Y/N): 07/29/22 Opioid Treatment Agreement last reviewed with patient:   NCCSRS/PDMP reviewed this encounter (include red flags): Yes    Management will include:  Medium Risk (10-90 MME) UDS every 3-6 months NCCSR check every visit Follow up Q1M initially, Q3M once established     DDD (degenerative disc disease), lumbar Got Multiple injections (?ESI) by Dr. Eliberto Ivory office, without benefit Will review records as above from prior pain clinic; may benefit from MBB    Unilateral primary osteoarthritis, right knee Go to your orthopedic appointment, and follow-up with [place] in 1 month.     Approaching surgery and in the immediate postop period when you are doing physical therapy, I am unlikely to change much with your pain regimen.     Once you have finished physical therapy, we may consider aqua therapy, or other therapy modalities to help get you moving.   Right shoulder pain, unspecified chronicity Will re-address next visit due to limited time for evaluation   Difficulty Sleeping For sleep, I want you to pick a time to lay down every night, ideally between 8 and 10 PM.   Starting 1 hour before you want to go to sleep, turn off all television screens, phone screens, tablets, and computers.  Keep the lights low and perform only low stimulation activities, such as reading.   Only use your bedroom for sleep and sex.   You may also take 3 to 5 mg of over-the-counter melatonin approximately 1 hour before bedtime.        Interval Hx:  -  Therapies: None yet.   - Follow ups: Last 2 appointments with Ortho Dr. Kathlene November he was out for sickness.    - Falls: none   - DME: Using walker. Does not have any braces for her knees. She has been told in the past by arthritis doctors that they would not be helpful.    - Medications: Oxycodone  "knocks the pain down, but it doesn't touch the shoulder". She used to take ibuprofen for the shoulder but hasn't since 2006.    - Startes she did try the Butrans patch around 2017 but it was not effective for her. She also was on both oxycodone and morphine at one point, but says that combo gave her sweats.   - Son smokes in her home; she smokes 5-6 cigarettes per day. She is looking to quit, tried a long time ago and has tried patches. Has never tried chantix. Sees Dr. Eileen Stanford and really likes her.    - Other concerns: Left shoulder, his, and knee are very painful and keep her form being able to sleep int he left side. Uses pillow and positioning to try and sleep, but hasn't been able to sleep at all because of pain and discomfort.   +spasms in her back and in her left leg. States she has had 2 strokes, most recently 2 years ago, effecting her left side. She says her function came back after a week of hospitalization and she has no residual deficits.   Her right hand also is burning, like it's "on fire".   Left knee too stiff to straighten.   Rainy weather makes it worse.   Re: alcohol, she says she was at a birthday party the day before her appointment and otherwise does not drink. She knows not to moving forward with her medications.   Pain Inventory Average Pain 10 Pain Right Now 10 My pain is sharp, burning, dull, stabbing, tingling, and aching  In the last 24 hours, has pain interfered with the following? General activity 9 Relation with others 8 Enjoyment of life 10 What TIME of day is your pain at its worst? morning , daytime, evening, and night Sleep (in general) NA  Pain is worse with: walking, bending, inactivity, standing, and some activites Pain improves with: heat/ice, medication, and injections Relief from Meds: 8  Family History  Problem Relation Age of Onset   Hyperlipidemia Mother    Hypertension Mother    Stroke Mother    Thyroid disease Mother     Heart attack Mother    Hyperlipidemia Father    Hypertension Father    Stroke Father    Heart attack Father    Hypertension Sister    Stroke Sister    Thyroid disease Sister    Breast cancer Sister    Crohn's disease Sister    Hypertension Sister    Hypertension Brother    Diabetes Brother    Hypertension Brother    Social History   Socioeconomic History   Marital status: Single    Spouse name: n/a   Number of children: 3   Years of education: 12+   Highest education level: Not on file  Occupational History   Occupation: disabled-falling, doesn't recall name of toxin    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  Tobacco Use   Smoking status: Every Day    Packs/day: 0.50    Years: 35.00    Additional pack years: 0.00    Total pack years: 17.50    Types:  Cigarettes   Smokeless tobacco: Never   Tobacco comments:    referred  to smoking  cessation  classes at  [facility]   Vaping Use   Vaping Use: Never used  Substance and Sexual Activity   Alcohol use: Yes    Comment: occasional   Drug use: No   Sexual activity: Not Currently    Partners: Female    Birth control/protection: Surgical    Comment: hysterectomy  Other Topics Concern   Not on file  Social History Narrative   Moved to [place] from [place], [place] February 2017, to help her daughter.   Lives with her daughter.   Sons live in [place] and [place].   She reports that there were originally 17 children in her family (she is the youngest), the oldest are deceased, some prior to her birth, and she isn't sure which were female/female or how they died.   Social Determinants of Health   Financial Resource Strain: Low Risk  (06/15/2022)   Overall Financial Resource Strain (CARDIA)    Difficulty of Paying Living Expenses: Not hard at all  Food Insecurity: No Food Insecurity (06/15/2022)   Hunger Vital Sign    Worried About Running Out of Food in the Last Year: Never true    Ran Out of Food in the Last Year:  Never true  Transportation Needs: No Transportation Needs (06/15/2022)   PRAPARE - Administrator, Civil Service (Medical): No    Lack of Transportation (Non-Medical): No  Physical Activity: Inactive (06/15/2022)   Exercise Vital Sign    Days of Exercise per Week: 0 days    Minutes of Exercise per Session: 0 min  Stress: No Stress Concern Present (06/15/2022)   [employer] of Occupational Health - Occupational Stress Questionnaire    Feeling of Stress : Not at all  Social Connections: Socially Isolated (06/15/2022)   Social Connection and Isolation Panel [NHANES]    Frequency of Communication with Friends and Family: More than three times a week    Frequency of Social Gatherings with Friends and Family: Once a week    Attends Religious Services: Never    Database administrator or Organizations: No    Attends Engineer, structural: Never    Marital Status: Never married   Past Surgical History:  Procedure Laterality Date   ABDOMINAL HYSTERECTOMY  1989   ovaries left   APPENDECTOMY  1989   BREAST LUMPECTOMY WITH RADIOACTIVE SEED LOCALIZATION Left 06/14/2019   Procedure: LEFT BREAST LUMPECTOMY WITH RADIOACTIVE SEED LOCALIZATION X 2;  Surgeon: Abigail Miyamoto, MD;  Location: [facility];  Service: General;  Laterality: Left;  LMA   CHOLECYSTECTOMY  1989   IR GENERIC HISTORICAL  11/07/2015   IR ANGIO INTRA EXTRACRAN SEL COM CAROTID INNOMINATE BILAT MOD SED 11/07/2015 Julieanne Cotton, MD [facility]   IR GENERIC HISTORICAL  11/07/2015   IR ANGIO VERTEBRAL SEL SUBCLAVIAN INNOMINATE UNI R MOD SED 11/07/2015 Julieanne Cotton, MD [facility]   IR GENERIC HISTORICAL  11/07/2015   IR ANGIO VERTEBRAL SEL VERTEBRAL UNI L MOD SED 11/07/2015 Julieanne Cotton, MD [facility]   IR GENERIC HISTORICAL  11/07/2015   IR ANGIOGRAM EXTREMITY LEFT 11/07/2015 Julieanne Cotton, MD [facility]   MASS EXCISION Left 03/17/2021   Procedure: EXCISION MASS,left middle finger ulnar border  mass excision,left thumb volar retinacular ganglion cyst excision;  Surgeon: Gomez Cleverly, MD;  Location: [facility] ;  Service: Orthopedics;  Laterality: Left;   TONSILLECTOMY  2000  adenoids removed   TOTAL KNEE ARTHROPLASTY Left 03/12/2017   Procedure: LEFT TOTAL KNEE ARTHROPLASTY;  Surgeon: Kathryne Hitch, MD;  Location: [facility];  Service: Orthopedics;  Laterality: Left;  Adductor Block   TRIGGER FINGER RELEASE Left 03/17/2021   Procedure: left trigger thumb release;  Surgeon: Gomez Cleverly, MD;  Location: [facility];  Service: Orthopedics;  Laterality: Left;  with local anesthesia   Past Surgical History:  Procedure Laterality Date   ABDOMINAL HYSTERECTOMY  1989   ovaries left   APPENDECTOMY  1989   BREAST LUMPECTOMY WITH RADIOACTIVE SEED LOCALIZATION Left 06/14/2019   Procedure: LEFT BREAST LUMPECTOMY WITH RADIOACTIVE SEED LOCALIZATION X 2;  Surgeon: Abigail Miyamoto, MD;  Location: [facility];  Service: General;  Laterality: Left;  LMA   CHOLECYSTECTOMY  1989   IR GENERIC HISTORICAL  11/07/2015   IR ANGIO INTRA EXTRACRAN SEL COM CAROTID INNOMINATE BILAT MOD SED 11/07/2015 Julieanne Cotton, MD [facility]   IR GENERIC HISTORICAL  11/07/2015   IR ANGIO VERTEBRAL SEL SUBCLAVIAN INNOMINATE UNI R MOD SED 11/07/2015 Julieanne Cotton, MD [facility]   IR GENERIC HISTORICAL  11/07/2015   IR ANGIO VERTEBRAL SEL VERTEBRAL UNI L MOD SED 11/07/2015 Julieanne Cotton, MD [facility]   IR GENERIC HISTORICAL  11/07/2015   IR ANGIOGRAM EXTREMITY LEFT 11/07/2015 Julieanne Cotton, MD [facility]   MASS EXCISION Left 03/17/2021   Procedure: EXCISION MASS,left middle finger ulnar border mass excision,left thumb volar retinacular ganglion cyst excision;  Surgeon: Gomez Cleverly, MD;  Location: [facility];  Service: Orthopedics;  Laterality: Left;   TONSILLECTOMY  2000   adenoids removed   TOTAL KNEE ARTHROPLASTY Left 03/12/2017   Procedure: LEFT  TOTAL KNEE ARTHROPLASTY;  Surgeon: Kathryne Hitch, MD;  Location: [facility];  Service: Orthopedics;  Laterality: Left;  Adductor Block   TRIGGER FINGER RELEASE Left 03/17/2021   Procedure: left trigger thumb release;  Surgeon: Gomez Cleverly, MD;  Location: [facility];  Service: Orthopedics;  Laterality: Left;  with local anesthesia   Past Medical History:  Diagnosis Date   Ambulates with cane 03/13/2021   Anxiety    Arthritis    Asthma    CHF (congestive heart failure) (HCC)    COPD (chronic obstructive pulmonary disease) (HCC)    Coronary artery disease    Depression    Diabetes mellitus without complication (HCC)    type 2    Fibromyalgia    Gallstones    GERD (gastroesophageal reflux disease)    HLD (hyperlipidemia)    Hypertension    Lupus (HCC)    Migraines    Myocardial infarction (HCC) 2017   Neuromuscular disorder (HCC)    Osteoporosis    Pancreatitis 05/27/2019   none since   Peripheral neuropathy 03/13/2021   all over pt pt   Shingles    Sleep apnea    sleep study 03-03-2017 mild osa no cpap used per pt   Stroke (HCC) 10/2015   no residual from   Uses walker 03/13/2021   Wears glasses    BP 128/76   Pulse 66   Ht 5\' 6"  (1.676 m)   Wt 288 lb 3.2 oz (130.7 kg)   SpO2 97%   BMI 46.52 kg/m   Opioid Risk Score:   Fall Risk Score:  `1  Depression screen [facility] 2/9     08/26/2022    9:29 AM 07/29/2022   10:54 AM 06/30/2022    8:37 AM 06/15/2022  8:55 AM 03/13/2022    9:21 AM 03/13/2022    8:01 AM 12/03/2021   11:49 AM  Depression screen PHQ 2/9  Decreased Interest 0 2 0 0 0 0 0  Down, Depressed, Hopeless 0 0 0 0 0 0 0  PHQ - 2 Score 0 2 0 0 0 0 0  Altered sleeping  3   1 1 1   Tired, decreased energy  1   1 1 1   Change in appetite  1   0 0 0  Feeling bad or failure about yourself   0   0 0 0  Trouble concentrating  0   0 0 0  Moving slowly or fidgety/restless  0   0 0 0  Suicidal thoughts  0   0 0 0  PHQ-9 Score  7   2 2 2    Difficult doing work/chores     Not difficult at all Not difficult at all Not difficult at all    Review of Systems  Constitutional: Negative.   HENT: Negative.    Eyes: Negative.   Respiratory: Negative.    Cardiovascular: Negative.   Gastrointestinal: Negative.   Endocrine: Negative.   Genitourinary: Negative.   Musculoskeletal:  Positive for arthralgias, back pain, gait problem and joint swelling.  Skin: Negative.   Allergic/Immunologic: Negative.   Hematological: Negative.   Psychiatric/Behavioral: Negative.    All other systems reviewed and are negative.      Objective:   Physical Exam     PE: Constitution: Appropriate appearance for age. No apparent distress. +Obese Resp: No respiratory distress. No accessory muscle usage. on RA and CTAB Cardio: Well perfused appearance. No peripheral edema. Abdomen: Nondistended. Nontender.   Psych: Appropriate mood and affect. Neuro: AAOx4. No apparent cognitive deficits    Neurologic Exam:   DTRs: Reflexes were 2+ in bilateral achilles, patella, biceps, BR and triceps. Babinsky: flexor responses b/l.   Hoffmans: negative b/l Sensory exam: revealed normal sensation in all dermatomal regions in bilateral lower extremities Motor exam: strength 5/5 throughout bilateral lower extremities Coordination: Fine motor coordination was normal.   Gait: +antalgic gait   Knees: +TTP lateral right knee joint, medial joint, and posterior fossa. +Fullness on R posterior knee. No TTP L knee.  AROM limited in R knee extension d/t pain; PROM limited to -5 degrees Negative anterior drawer, posterior drawer, and lateral/medial stress for increased laxity   Back:  Negative slump, forward bending + Facet loading b/l       Assessment & Plan:   Tammy Strickland is a 62 y.o. year old female  who  has a past medical history of Ambulates with cane (03/13/2021), Anxiety, Arthritis, Asthma, CHF (congestive heart failure) (HCC), COPD (chronic  obstructive pulmonary disease) (HCC), Coronary artery disease, Depression, Diabetes mellitus without complication (HCC), Fibromyalgia, Gallstones, GERD (gastroesophageal reflux disease), HLD (hyperlipidemia), Hypertension, Lupus (HCC), Migraines, Myocardial infarction (HCC) (2017), Neuromuscular disorder (HCC), Osteoporosis, Pancreatitis (05/27/2019), Peripheral neuropathy (03/13/2021), Shingles, Sleep apnea, Stroke (HCC) (10/2015), Uses walker (03/13/2021), and Wears glasses.   They are presenting to PM&R clinic as a new patient for treatment of chronic pain primarily in low back, R shoulder and R knee .  Chronic pain syndrome -     DG Shoulder Left; Future  [facility] send [place] your records I will refill your pain medications for another 3 months Follow up with me or Riley Lam our NP in 3 months  DDD (degenerative disc disease), lumbar I increased your tizanidine to 4 mg  tablets up to twice daily for spasms  Unilateral primary osteoarthritis, right knee Have orthopedics send [place] the notes from your appointment on Friday  Sleeping difficulty  For sleep, I want you to pick a time to lay down every night, ideally between 8 and 10 PM.    Starting 1 hour before you want to go to sleep, turn off all television screens, phone screens, tablets, and computers.    Keep the lights low and perform only low stimulation activities, such as reading.    Only use your bedroom for sleep and sex.    You may also take 3 to 5 mg of over-the-counter melatonin approximately 1 hour before bedtime.     Chronic left shoulder pain -     DG Shoulder Left; Future Get a shoulder xray; I will message you through MyChart about results. If no injections are planned with Orthopedics in the next 6 weeks, we can schedule you for a shoulder injection based on the results of your imaging  Trochanteric bursitis, left hip Place a lidocaine patch over your left hip for pain control. We can also discuss injections for  this in the future.   Attempting to quit tobacco I will message your PCP for assistance in this

## 2022-08-26 NOTE — Patient Instructions (Signed)
  Chronic pain syndrome -     DG Shoulder Left; Future  [facility] send [place] your records I will refill your pain medications for another 3 months Follow up with me or Riley Lam our NP in 3 months  DDD (degenerative disc disease), lumbar I increased your tizanidine to 4 mg tablets up to twice daily for spasms  Unilateral primary osteoarthritis, right knee Have orthopedics send [place] the notes from your appointment on Friday  Sleeping difficulty  For sleep, I want you to pick a time to lay down every night, ideally between 8 and 10 PM.   Starting 1 hour before you want to go to sleep, turn off all television screens, phone screens, tablets, and computers.    Keep the lights low and perform only low stimulation activities, such as reading.    Only use your bedroom for sleep and sex.    You may also take 3 to 5 mg of over-the-counter melatonin approximately 1 hour before bedtime.     Chronic left shoulder pain -     DG Shoulder Left; Future Get a shoulder xray; I will message you through MyChart about results. If no injections are planned with Orthopedics in the next 6 weeks, we can schedule you for a shoulder injection based on the results of your imaging  Trochanteric bursitis, left hip Place a lidocaine patch over your left hip for pain control. We can also discuss injections for this in the future.

## 2022-09-04 ENCOUNTER — Other Ambulatory Visit: Payer: Self-pay

## 2022-09-04 DIAGNOSIS — I10 Essential (primary) hypertension: Secondary | ICD-10-CM | POA: Diagnosis not present

## 2022-09-04 DIAGNOSIS — E1142 Type 2 diabetes mellitus with diabetic polyneuropathy: Secondary | ICD-10-CM | POA: Diagnosis not present

## 2022-09-04 DIAGNOSIS — E1165 Type 2 diabetes mellitus with hyperglycemia: Secondary | ICD-10-CM | POA: Diagnosis not present

## 2022-09-04 DIAGNOSIS — E782 Mixed hyperlipidemia: Secondary | ICD-10-CM | POA: Diagnosis not present

## 2022-09-04 DIAGNOSIS — M159 Polyosteoarthritis, unspecified: Secondary | ICD-10-CM | POA: Diagnosis not present

## 2022-09-04 MED ORDER — MOUNJARO 5 MG/0.5ML ~~LOC~~ SOAJ
5.0000 mg | SUBCUTANEOUS | 5 refills | Status: DC
  Filled 2022-09-04: qty 2, 28d supply, fill #0
  Filled 2022-09-28: qty 2, 28d supply, fill #1
  Filled 2022-12-17: qty 2, 28d supply, fill #2
  Filled 2022-12-17: qty 2, 28d supply, fill #0

## 2022-09-14 DIAGNOSIS — Z96652 Presence of left artificial knee joint: Secondary | ICD-10-CM | POA: Diagnosis not present

## 2022-09-14 DIAGNOSIS — M1711 Unilateral primary osteoarthritis, right knee: Secondary | ICD-10-CM | POA: Diagnosis not present

## 2022-09-19 ENCOUNTER — Encounter (HOSPITAL_COMMUNITY): Payer: Self-pay

## 2022-09-19 ENCOUNTER — Emergency Department (HOSPITAL_COMMUNITY)
Admission: EM | Admit: 2022-09-19 | Discharge: 2022-09-20 | Disposition: A | Discharge status: Home / Self Care | Payer: 59 | Attending: Emergency Medicine | Admitting: Emergency Medicine

## 2022-09-19 DIAGNOSIS — R404 Transient alteration of awareness: Secondary | ICD-10-CM | POA: Diagnosis not present

## 2022-09-19 DIAGNOSIS — N1831 Chronic kidney disease, stage 3a: Secondary | ICD-10-CM | POA: Diagnosis not present

## 2022-09-19 DIAGNOSIS — Z881 Allergy status to other antibiotic agents status: Secondary | ICD-10-CM | POA: Diagnosis not present

## 2022-09-19 DIAGNOSIS — Z5321 Procedure and treatment not carried out due to patient leaving prior to being seen by health care provider: Secondary | ICD-10-CM | POA: Diagnosis not present

## 2022-09-19 DIAGNOSIS — Z7982 Long term (current) use of aspirin: Secondary | ICD-10-CM | POA: Diagnosis not present

## 2022-09-19 DIAGNOSIS — Z87891 Personal history of nicotine dependence: Secondary | ICD-10-CM | POA: Diagnosis not present

## 2022-09-19 DIAGNOSIS — Z7985 Long-term (current) use of injectable non-insulin antidiabetic drugs: Secondary | ICD-10-CM | POA: Diagnosis not present

## 2022-09-19 DIAGNOSIS — Z833 Family history of diabetes mellitus: Secondary | ICD-10-CM | POA: Diagnosis not present

## 2022-09-19 DIAGNOSIS — Z7984 Long term (current) use of oral hypoglycemic drugs: Secondary | ICD-10-CM | POA: Diagnosis not present

## 2022-09-19 DIAGNOSIS — E1159 Type 2 diabetes mellitus with other circulatory complications: Secondary | ICD-10-CM | POA: Diagnosis not present

## 2022-09-19 DIAGNOSIS — M4726 Other spondylosis with radiculopathy, lumbar region: Secondary | ICD-10-CM | POA: Diagnosis not present

## 2022-09-19 DIAGNOSIS — E1169 Type 2 diabetes mellitus with other specified complication: Secondary | ICD-10-CM | POA: Diagnosis not present

## 2022-09-19 DIAGNOSIS — Z8349 Family history of other endocrine, nutritional and metabolic diseases: Secondary | ICD-10-CM | POA: Diagnosis not present

## 2022-09-19 DIAGNOSIS — G5601 Carpal tunnel syndrome, right upper limb: Secondary | ICD-10-CM | POA: Diagnosis not present

## 2022-09-19 DIAGNOSIS — R064 Hyperventilation: Secondary | ICD-10-CM | POA: Diagnosis not present

## 2022-09-19 DIAGNOSIS — Z743 Need for continuous supervision: Secondary | ICD-10-CM | POA: Diagnosis not present

## 2022-09-19 DIAGNOSIS — R55 Syncope and collapse: Secondary | ICD-10-CM | POA: Diagnosis not present

## 2022-09-19 DIAGNOSIS — Z8249 Family history of ischemic heart disease and other diseases of the circulatory system: Secondary | ICD-10-CM | POA: Diagnosis not present

## 2022-09-19 DIAGNOSIS — G4700 Insomnia, unspecified: Secondary | ICD-10-CM | POA: Diagnosis not present

## 2022-09-19 DIAGNOSIS — K219 Gastro-esophageal reflux disease without esophagitis: Secondary | ICD-10-CM | POA: Diagnosis not present

## 2022-09-19 DIAGNOSIS — E785 Hyperlipidemia, unspecified: Secondary | ICD-10-CM | POA: Diagnosis not present

## 2022-09-19 DIAGNOSIS — E1165 Type 2 diabetes mellitus with hyperglycemia: Secondary | ICD-10-CM | POA: Diagnosis not present
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
## 2022-09-20 NOTE — ED Notes (Signed)
When attempting to room pt. From the waiting room, pt was called 3x with no response. Pt. Did not notify registration that she was leaving.

## 2022-09-23 NOTE — Patient Instructions (Addendum)
      A1c today-8.0   Medications changes include :  nicotine 7 mg/24 hr       Return in about 3 months (around 12/25/2022) for follow up.

## 2022-09-23 NOTE — Progress Notes (Signed)
Subjective:    Patient ID: Tammy Strickland, female    DOB: 1960/10/28, 62 y.o.   MRN: [id number]      HPI Tammy Strickland is here for pre-operative clearance at the request of Dr Linna Caprice for R total knee arthroplasty scheduled for TBD.   Tammy Strickland denies any personal or family history of problems with anesthesia or bleeding/blood clot problems.    Tammy Strickland has no concerns and is taking all prescribed medication as prescribed.   Tammy Strickland is not exercising regularly due to her knee and back pain.  With their daily activities they denies chest pain, palpitations, SOB and lightheadedness.      Smoking - plans on quitting after surgery  Medications and allergies reviewed with patient and updated if appropriate.  Current Outpatient Medications on File Prior to Visit  Medication Sig Dispense Refill   Accu-Chek Softclix Lancets lancets ONE EACH BY OTHER ROUTE FOUR TIMES DAILY. 100 each 11   albuterol (VENTOLIN HFA) 108 (90 Base) MCG/ACT inhaler Inhale 2 puffs into the lungs every 6 (six) hours as needed for wheezing or shortness of breath. 8 g 8   amitriptyline (ELAVIL) 75 MG tablet Take 1 tablet by mouth at bedtime 30 tablet 11   ascorbic acid (VITAMIN C) 500 MG tablet Take 1 tablet (500 mg total) by mouth daily. 7 tablet 0   aspirin EC 81 MG tablet Take 1 tablet (81 mg total) by mouth daily.     atorvastatin (LIPITOR) 20 MG tablet Take 1 tablet by mouth every day 30 tablet 11   Cholecalciferol (VITAMIN D3) 50 MCG (2000 UT) capsule Take 1 capsule (2,000 Units total) by mouth daily. 90 capsule 1   cloNIDine (CATAPRES) 0.3 MG tablet Take 1 tablet by mouth 3 times a day 90 tablet 0   fluticasone (FLONASE) 50 MCG/ACT nasal spray Shake liquid & use 1 spray into each nostril every day as needed for allergies 16 g 11   fluticasone furoate-vilanterol (BREO ELLIPTA) 100-25 MCG/INH AEPB Inhale 1 puff into the lungs daily. 1 each 11   furosemide (LASIX) 80 MG tablet Take 1 tablet by mouth every day 30 tablet  11   glucose blood (ACCU-CHEK GUIDE) test strip TEST TWICE DAILY 200 strip 11   hydrOXYzine (ATARAX/VISTARIL) 25 MG tablet Take 25 mg by mouth at bedtime.     insulin glargine, 2 Unit Dial, (TOUJEO MAX SOLOSTAR) 300 UNIT/ML Solostar Pen Inject 180 Units into the skin every morning. And pen needles 1/day (Patient taking differently: Inject 160 Units into the skin every evening. And pen needles 1/day) 60 mL 3   Insulin Pen Needle (PEN NEEDLES) 32G X 5 MM MISC UAD daily for insulin 90 each 3   KLOR-CON M10 10 MEQ tablet Take 2 tablets by mouth every day 60 tablet 11   losartan (COZAAR) 50 MG tablet Take 1 tablet by mouth twice daily 60 tablet 11   metoprolol (TOPROL-XL) 200 MG 24 hr tablet Take 1 tablet by mouth twice daily 60 tablet 11   NARCAN 4 MG/0.1ML LIQD nasal spray kit Place 1 spray into the nose once.     nitroGLYCERIN (NITROSTAT) 0.4 MG SL tablet Place 0.4 mg under the tongue every 5 (five) minutes as needed for chest pain.     ondansetron (ZOFRAN) 4 MG tablet Take 4 mg by mouth every 8 (eight) hours as needed.     oxyCODONE-acetaminophen (PERCOCET) 10-325 MG tablet Take 1 tablet by mouth every 6 (six) hours. 120 tablet 0   [START  ON 09/30/2022] oxyCODONE-acetaminophen (PERCOCET) 10-325 MG tablet Take 1 tablet by mouth every 6 (six) hours as needed for pain. 120 tablet 0   [START ON 10/30/2022] oxyCODONE-acetaminophen (PERCOCET) 10-325 MG tablet Take 1 tablet by mouth every 6 (six) hours as needed for pain. 120 tablet 0   pantoprazole (PROTONIX) 40 MG tablet Take 1 tablet by mouth twice daily 60 tablet 11   SYMBICORT 80-4.5 MCG/ACT inhaler Inhale 2 puffs into the lungs daily as needed 10.2 each 11   tirzepatide (MOUNJARO) 5 MG/0.5ML Pen Inject 5 mg into the skin once a week. 2 mL 5   tiZANidine (ZANAFLEX) 4 MG tablet Take 1 tablet (4 mg total) by mouth 2 (two) times daily as needed for muscle spasms. 60 tablet 5   ZTLIDO 1.8 % PTCH Apply 1 patch topically daily as needed (knee pain).      insulin glargine (LANTUS SOLOSTAR) 100 UNIT/ML Solostar Pen Inject 150 units Subcutaneous twice daily     No current facility-administered medications on file prior to visit.    Review of Systems  Constitutional:  Negative for fever.  HENT:  Positive for ear pain (left ear - feels like something in the ear - numbness intermittent).   Eyes:  Negative for visual disturbance.  Respiratory:  Negative for cough, shortness of breath and wheezing.   Cardiovascular:  Positive for leg swelling. Negative for chest pain and palpitations.  Gastrointestinal:  Negative for abdominal pain, blood in stool, constipation and diarrhea.       No gerd  Genitourinary:  Negative for dysuria.  Musculoskeletal:  Positive for arthralgias and back pain.  Skin:  Negative for rash.  Neurological:  Negative for light-headedness and headaches.       Objective:   Vitals:   09/24/22 0810  BP: 134/78  Pulse: 65  Temp: 98.4 F (36.9 C)  SpO2: 99%   Filed Weights   09/24/22 0810  Weight: 278 lb (126.1 kg)   Body mass index is 44.87 kg/m.  BP Readings from Last 3 Encounters:  09/24/22 134/78  09/19/22 108/67  08/26/22 128/76    Wt Readings from Last 3 Encounters:  09/24/22 278 lb (126.1 kg)  08/26/22 288 lb 3.2 oz (130.7 kg)  07/29/22 283 lb (128.4 kg)       Physical Exam Constitutional: She appears well-developed and well-nourished. No distress.  HENT:  Head: Normocephalic and atraumatic.  Right Ear: External ear normal. Normal ear canal and TM Left Ear: External ear normal.  Normal ear canal and TM Mouth/Throat: Oropharynx is clear and moist.  Eyes: Conjunctivae normal.  Neck: Neck supple. No tracheal deviation present. No thyromegaly present.  No carotid bruit  Cardiovascular: Normal rate, regular rhythm and normal heart sounds.  No murmur heard.  No edema. Pulmonary/Chest: Effort normal and breath sounds normal. No respiratory distress. She has no wheezes. She has no rales.  Abdominal:  Soft. She exhibits no distension. There is no tenderness.  Lymphadenopathy: She has no cervical adenopathy.  Skin: Skin is warm and dry. She is not diaphoretic.  Psychiatric: She has a normal mood and affect. Her behavior is normal.     Lab Results  Component Value Date   WBC 16.6 (H) 06/02/2022   HGB 12.7 06/02/2022   HCT 39.3 06/02/2022   PLT 340.0 06/02/2022   GLUCOSE 239 (H) 06/02/2022   CHOL 149 06/02/2022   TRIG 112.0 06/02/2022   HDL 65.20 06/02/2022   LDLDIRECT 101.0 09/19/2019   LDLCALC 61 06/02/2022   ALT  17 06/02/2022   AST 17 06/02/2022   NA 134 (L) 06/02/2022   K 4.3 06/02/2022   CL 97 06/02/2022   CREATININE 0.71 06/02/2022   BUN 15 06/02/2022   CO2 27 06/02/2022   TSH 5.02 01/07/2021   INR 1.06 11/07/2015   HGBA1C 9.1 (H) 06/02/2022   MICROALBUR 2.5 (H) 06/02/2022         Assessment & Plan:     See Problem List for Assessment and Plan of chronic medical problems.

## 2022-09-23 NOTE — Assessment & Plan Note (Addendum)
Here for clearance for right total knee arthroplasty Chronic medical problems stable Hypertension controlled Sugars are not ideally controlled, but have improved-A1c today is 8.0 No concerning symptoms suggestive of CAD, COPD controlled  EKG from 04/2020: Sinus bradycardia 56 bpm, possible LVH, otherwise normal   Low risk for low risk surgery - cleared for surgery.

## 2022-09-24 ENCOUNTER — Ambulatory Visit (INDEPENDENT_AMBULATORY_CARE_PROVIDER_SITE_OTHER): Payer: 59 | Admitting: Internal Medicine

## 2022-09-24 ENCOUNTER — Telehealth: Payer: Self-pay

## 2022-09-24 ENCOUNTER — Encounter: Payer: Self-pay | Admitting: Internal Medicine

## 2022-09-24 VITALS — BP 134/78 | HR 65 | Temp 98.4°F | Ht 66.0 in | Wt 278.0 lb

## 2022-09-24 DIAGNOSIS — E1142 Type 2 diabetes mellitus with diabetic polyneuropathy: Secondary | ICD-10-CM | POA: Diagnosis not present

## 2022-09-24 DIAGNOSIS — F17210 Nicotine dependence, cigarettes, uncomplicated: Secondary | ICD-10-CM

## 2022-09-24 DIAGNOSIS — I10 Essential (primary) hypertension: Secondary | ICD-10-CM | POA: Diagnosis not present

## 2022-09-24 DIAGNOSIS — E1165 Type 2 diabetes mellitus with hyperglycemia: Secondary | ICD-10-CM

## 2022-09-24 DIAGNOSIS — Z01818 Encounter for other preprocedural examination: Secondary | ICD-10-CM | POA: Diagnosis not present

## 2022-09-24 DIAGNOSIS — Z794 Long term (current) use of insulin: Secondary | ICD-10-CM

## 2022-09-24 DIAGNOSIS — I2510 Atherosclerotic heart disease of native coronary artery without angina pectoris: Secondary | ICD-10-CM

## 2022-09-24 DIAGNOSIS — J449 Chronic obstructive pulmonary disease, unspecified: Secondary | ICD-10-CM | POA: Diagnosis not present

## 2022-09-24 LAB — POCT GLYCOSYLATED HEMOGLOBIN (HGB A1C)
HbA1c POC (<> result, manual entry): 8 % (ref 4.0–5.6)
HbA1c, POC (controlled diabetic range): 8 % — AB (ref 0.0–7.0)
HbA1c, POC (prediabetic range): 8 % — AB (ref 5.7–6.4)
Hemoglobin A1C: 8 % — AB (ref 4.0–5.6)

## 2022-09-24 MED ORDER — TRIAMCINOLONE ACETONIDE 0.1 % EX CREA: 1.0000 | TOPICAL_CREAM | Freq: Two times a day (BID) | CUTANEOUS | 0 refills | Status: DC

## 2022-09-24 MED ORDER — PEN NEEDLES 32G X 5 MM MISC
3 refills | Status: DC
Start: 2022-09-24 — End: 2023-08-30

## 2022-09-24 MED ORDER — NICOTINE 7 MG/24HR TD PT24: 7.0000 mg | MEDICATED_PATCH | Freq: Every day | TRANSDERMAL | 0 refills | Status: DC

## 2022-09-24 MED ORDER — TOUJEO MAX SOLOSTAR 300 UNIT/ML ~~LOC~~ SOPN: 160.0000 [IU] | PEN_INJECTOR | Freq: Every evening | SUBCUTANEOUS | 3 refills | Status: DC

## 2022-09-24 NOTE — Assessment & Plan Note (Signed)
She is not experiencing any chest pain, shortness of breath, palpitations or lightheadedness No symptoms consistent with ischemia Continue aspirin 81 mg daily, atorvastatin and metoprolol 

## 2022-09-24 NOTE — Assessment & Plan Note (Addendum)
Chronic Controlled Continue maintenance inhaler-Symbicort 2 puffs daily prn - does not need daily Continue albuterol as needed

## 2022-09-24 NOTE — Assessment & Plan Note (Addendum)
Smoking cessation was discussed for  3 minutes.  The patient was counseled on the dangers of tobacco use, and was advised to quit.  Reviewed ways of quitting smoking including nicotine replacement, vapping/e-cigarettes, cold [place], weaning off cigarettes, and pharmacotherapy (wellbutrin and chantix).  Has tried wellbutrin - did not work well.   She is trying to quit, but has not been successful.  She has cut down a lot -- Currently smoking about 6 cig / day. Start nicotine patch 7mg  /24 hr after surgery - plans on quitting then --- prescription sent to pharmacy

## 2022-09-24 NOTE — Assessment & Plan Note (Signed)
Chronic Blood pressure adequately controlled Continue losartan 50 mg twice daily, metoprolol XL 200 mg twice daily 

## 2022-09-24 NOTE — Assessment & Plan Note (Signed)
Chronic Following with endocrine Sugars have been uncontrolled A1c today  Continue Mounjaro to 5 mg weekly On toujeo 160 units daily Stressed that she needs to be eating protein, vegetables-low carbs and sugars.  Stressed that she needs to be eating regularly

## 2022-09-25 ENCOUNTER — Other Ambulatory Visit (HOSPITAL_COMMUNITY): Payer: Self-pay

## 2022-09-25 NOTE — Telephone Encounter (Signed)
Pharmacy Patient Advocate Encounter   Received notification that prior authorization for Nicotine patches is required/requested.   Per test claim, Nicotice patches are not covered under Part D law, OTCs are not covered.   Prior authorization not submitted.

## 2022-09-28 ENCOUNTER — Other Ambulatory Visit (HOSPITAL_COMMUNITY): Payer: Self-pay

## 2022-09-28 ENCOUNTER — Other Ambulatory Visit: Payer: Self-pay

## 2022-09-28 MED ORDER — DEXCOM G7 SENSOR MISC
5 refills | Status: AC
  Filled 2022-09-28 (×2): qty 3, 30d supply, fill #0

## 2022-09-29 ENCOUNTER — Other Ambulatory Visit (HOSPITAL_COMMUNITY): Payer: Self-pay

## 2022-10-06 DIAGNOSIS — E1165 Type 2 diabetes mellitus with hyperglycemia: Secondary | ICD-10-CM | POA: Diagnosis not present

## 2022-10-31 ENCOUNTER — Other Ambulatory Visit: Payer: Self-pay | Admitting: Internal Medicine

## 2022-11-03 ENCOUNTER — Telehealth: Payer: Self-pay | Admitting: Internal Medicine

## 2022-11-03 NOTE — Telephone Encounter (Signed)
Message left for Tammy Strickland to return call to clinic.  If she calls back please get the name of minor procedure and potential or schedule date.

## 2022-11-03 NOTE — Telephone Encounter (Signed)
Pam from [facility] called to discuss the pt's medications. Pt is having a minor procedure and is being given an antibiotic to prevent infection, pt also wants an antifungal from the dentist office and told them that Dr. Lawerance Bach always does this because she is so prone to yeast infections.   Dental office would like to confirm, please call Pam at:  [phone]

## 2022-11-05 DIAGNOSIS — E1165 Type 2 diabetes mellitus with hyperglycemia: Secondary | ICD-10-CM | POA: Diagnosis not present

## 2022-11-07 ENCOUNTER — Other Ambulatory Visit: Payer: Self-pay | Admitting: Internal Medicine

## 2022-11-07 DIAGNOSIS — I10 Essential (primary) hypertension: Secondary | ICD-10-CM

## 2022-11-25 ENCOUNTER — Ambulatory Visit
Admission: RE | Admit: 2022-11-25 | Discharge: 2022-11-25 | Disposition: A | Discharge status: Home / Self Care | Payer: 59 | Source: Ambulatory Visit | Attending: Physical Medicine and Rehabilitation | Admitting: Physical Medicine and Rehabilitation

## 2022-11-25 ENCOUNTER — Encounter: Payer: Self-pay | Admitting: Physical Medicine and Rehabilitation

## 2022-11-25 ENCOUNTER — Encounter: Payer: 59 | Attending: Physical Medicine and Rehabilitation | Admitting: Physical Medicine and Rehabilitation

## 2022-11-25 VITALS — BP 142/85 | HR 66 | Ht 66.0 in | Wt 281.0 lb

## 2022-11-25 DIAGNOSIS — Z789 Other specified health status: Secondary | ICD-10-CM | POA: Insufficient documentation

## 2022-11-25 DIAGNOSIS — G8929 Other chronic pain: Secondary | ICD-10-CM | POA: Diagnosis not present

## 2022-11-25 DIAGNOSIS — M25512 Pain in left shoulder: Secondary | ICD-10-CM | POA: Diagnosis not present

## 2022-11-25 DIAGNOSIS — M1711 Unilateral primary osteoarthritis, right knee: Secondary | ICD-10-CM | POA: Insufficient documentation

## 2022-11-25 DIAGNOSIS — G894 Chronic pain syndrome: Secondary | ICD-10-CM | POA: Insufficient documentation

## 2022-11-25 DIAGNOSIS — Z79891 Long term (current) use of opiate analgesic: Secondary | ICD-10-CM | POA: Diagnosis not present

## 2022-11-25 DIAGNOSIS — Z5181 Encounter for therapeutic drug level monitoring: Secondary | ICD-10-CM | POA: Diagnosis not present

## 2022-11-25 DIAGNOSIS — G479 Sleep disorder, unspecified: Secondary | ICD-10-CM | POA: Insufficient documentation

## 2022-11-25 DIAGNOSIS — M19012 Primary osteoarthritis, left shoulder: Secondary | ICD-10-CM | POA: Diagnosis not present

## 2022-11-25 MED ORDER — TIZANIDINE HCL 4 MG PO TABS: 4.0000 mg | ORAL_TABLET | Freq: Three times a day (TID) | ORAL | 3 refills | Status: DC | PRN

## 2022-11-25 MED ORDER — OXYCODONE-ACETAMINOPHEN 10-325 MG PO TABS: 1.0000 | ORAL_TABLET | ORAL | 0 refills | Status: DC | PRN

## 2022-11-25 MED ORDER — OXYCODONE-ACETAMINOPHEN 10-325 MG PO TABS: 1.0000 | ORAL_TABLET | ORAL | 0 refills | Status: AC | PRN

## 2022-11-25 NOTE — Progress Notes (Signed)
Subjective:    Patient ID: Tammy Strickland, female    DOB: 1960/07/05, 62 y.o.   MRN: [id number]  HPI  Tammy Strickland is a 62 y.o. year old female  who  has a past medical history of Ambulates with cane (03/13/2021), Anxiety, Arthritis, Asthma, CHF (congestive heart failure) (HCC), COPD (chronic obstructive pulmonary disease) (HCC), Coronary artery disease, Depression, Diabetes mellitus without complication (HCC), Fibromyalgia, Gallstones, GERD (gastroesophageal reflux disease), HLD (hyperlipidemia), Hypertension, Lupus (HCC), Migraines, Myocardial infarction (HCC) (2017), Neuromuscular disorder (HCC), Osteoporosis, Pancreatitis (05/27/2019), Peripheral neuropathy (03/13/2021), Shingles, Sleep apnea, Stroke (HCC) (10/2015), Uses walker (03/13/2021), and Wears glasses.   They are presenting to PM&R clinic for follow up related to pain in low back, L shoulder and R knee  .  Plan from last visit:  Chronic pain syndrome -     DG Shoulder Left; Future   Have [facility] send [place] your records I will refill your pain medications for another 3 months Follow up with me or Riley Lam our NP in 3 months   DDD (degenerative disc disease), lumbar I increased your tizanidine to 4 mg tablets up to twice daily for spasms   Unilateral primary osteoarthritis, right knee Have orthopedics send [place] the notes from your appointment on Friday   Sleeping difficulty   For sleep, I want you to pick a time to lay down every night, ideally between 8 and 10 PM.     Starting 1 hour before you want to go to sleep, turn off all television screens, phone screens, tablets, and computers.     Keep the lights low and perform only low stimulation activities, such as reading.     Only use your bedroom for sleep and sex.     You may also take 3 to 5 mg of over-the-counter melatonin approximately 1 hour before bedtime.      Chronic left shoulder pain -     DG Shoulder Left; Future Get a shoulder xray; I will message  you through MyChart about results. If no injections are planned with Orthopedics in the next 6 weeks, we can schedule you for a shoulder injection based on the results of your imaging   Trochanteric bursitis, left hip Place a lidocaine patch over your left hip for pain control. We can also discuss injections for this in the future.    Attempting to quit tobacco I will message your PCP for assistance in this   Interval Hx:  - Therapies: PT after knee replacement.    - Follow ups: Ortho Dr Linna Caprice 09/14/22: She is having worsening right knee pain. Her most recent injection in February did not give any significant pain relief. At this point, she has failed conservative management. I recommend proceeding with right total knee replacement.   Has Ha1C 7.8 now; now in range for surgery.    - Falls: Fell down 7 stairs and hit her head last Tuesday. She was trying to get away from rats in her building; she fell flat on her back and her head hit the corner of the bricks. EMS came and evaluated her; she was determined to not have a concussion, has been overall OK but diffusely sore since then. +Headaches, no nausea or dizziness, no vision issues.    - DME: Using a cane for ambulation intermittently, usually uses a rollator walker for support. She is interested in getting a hoover chair because it is really hard to walk around  in the community, to [employer] and such.    -  Medications: Nicotine patches working well.  She takes tizanidine for muscle spasms, with benefit. No side effects. Takes 4 mg twice a day. Has tried flexeril in the past without benefit. Has not tried baclofen in the past.   Also taking elavil at nighttime to help relax/sleep; minimal benefit  Using lidcaine patches on the leg for cramping pains, with benefit.   Percocet 10 mg Q6H - pain remains severe but well controlled.   Taking aleve 2 tabs and motrin 2 tabs Prn for headaches. Also using heat pack on her head when she  lays down. Not having every day at this point. They are getting better.    - Other concerns: Has had issues recently with scammer getting disability benefits, has been stressful.   Is on nicotine patches, now at the point where a pack of cigarettes will last her 3 days! Is not smoking in the house anymore because her grandkids are around. Is doing really well.   Pain Inventory Average Pain 10 Pain Right Now 10 My pain is constant, sharp, burning, dull, stabbing, tingling, and aching  In the last 24 hours, has pain interfered with the following? General activity 3 Relation with others 3 Enjoyment of life 3 What TIME of day is your pain at its worst? morning , daytime, evening, and night Sleep (in general) Poor  Pain is worse with: walking, bending, inactivity, and standing Pain improves with: heat/ice, medication, and injections Relief from Meds: 9  Family History  Problem Relation Age of Onset   Hyperlipidemia Mother    Hypertension Mother    Stroke Mother    Thyroid disease Mother    Heart attack Mother    Hyperlipidemia Father    Hypertension Father    Stroke Father    Heart attack Father    Hypertension Sister    Stroke Sister    Thyroid disease Sister    Breast cancer Sister    Crohn's disease Sister    Hypertension Sister    Hypertension Brother    Diabetes Brother    Hypertension Brother    Social History   Socioeconomic History   Marital status: Single    Spouse name: n/a   Number of children: 3   Years of education: 12+   Highest education level: Not on file  Occupational History   Occupation: disabled-falling, doesn't recall name of toxin    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  Tobacco Use   Smoking status: Every Day    Current packs/day: 0.50    Average packs/day: 0.5 packs/day for 35.0 years (17.5 ttl pk-yrs)    Types: Cigarettes   Smokeless tobacco: Never   Tobacco comments:    referred  to smoking  cessation  classes at   [employer] Use   Vaping status: Never Used  Substance and Sexual Activity   Alcohol use: Yes    Comment: occasional   Drug use: No   Sexual activity: Not Currently    Partners: Female    Birth control/protection: Surgical    Comment: hysterectomy  Other Topics Concern   Not on file  Social History Narrative   Moved to [place] from [place], [place] February 2017, to help her daughter.   Lives with her daughter.   Sons live in [place] and [place].   She reports that there were originally 17 children in her family (she is the youngest), the oldest are deceased, some prior to her birth, and she isn't sure which were female/female or how  they died.   Social Determinants of Health   Financial Resource Strain: Low Risk  (06/15/2022)   Overall Financial Resource Strain (CARDIA)    Difficulty of Paying Living Expenses: Not hard at all  Food Insecurity: No Food Insecurity (06/15/2022)   Hunger Vital Sign    Worried About Running Out of Food in the Last Year: Never true    Ran Out of Food in the Last Year: Never true  Transportation Needs: No Transportation Needs (06/15/2022)   PRAPARE - Administrator, Civil Service (Medical): No    Lack of Transportation (Non-Medical): No  Physical Activity: Inactive (06/15/2022)   Exercise Vital Sign    Days of Exercise per Week: 0 days    Minutes of Exercise per Session: 0 min  Stress: No Stress Concern Present (06/15/2022)   [employer] of Occupational Health - Occupational Stress Questionnaire    Feeling of Stress : Not at all  Social Connections: Socially Isolated (06/15/2022)   Social Connection and Isolation Panel [NHANES]    Frequency of Communication with Friends and Family: More than three times a week    Frequency of Social Gatherings with Friends and Family: Once a week    Attends Religious Services: Never    Database administrator or Organizations: No    Attends Engineer, structural: Never    Marital Status: Never  married   Past Surgical History:  Procedure Laterality Date   ABDOMINAL HYSTERECTOMY  1989   ovaries left   APPENDECTOMY  1989   BREAST LUMPECTOMY WITH RADIOACTIVE SEED LOCALIZATION Left 06/14/2019   Procedure: LEFT BREAST LUMPECTOMY WITH RADIOACTIVE SEED LOCALIZATION X 2;  Surgeon: Abigail Miyamoto, MD;  Location: [facility];  Service: General;  Laterality: Left;  LMA   CHOLECYSTECTOMY  1989   IR GENERIC HISTORICAL  11/07/2015   IR ANGIO INTRA EXTRACRAN SEL COM CAROTID INNOMINATE BILAT MOD SED 11/07/2015 Julieanne Cotton, MD [facility]   IR GENERIC HISTORICAL  11/07/2015   IR ANGIO VERTEBRAL SEL SUBCLAVIAN INNOMINATE UNI R MOD SED 11/07/2015 Julieanne Cotton, MD [facility]   IR GENERIC HISTORICAL  11/07/2015   IR ANGIO VERTEBRAL SEL VERTEBRAL UNI L MOD SED 11/07/2015 Julieanne Cotton, MD [facility]   IR GENERIC HISTORICAL  11/07/2015   IR ANGIOGRAM EXTREMITY LEFT 11/07/2015 Julieanne Cotton, MD [facility]   MASS EXCISION Left 03/17/2021   Procedure: EXCISION MASS,left middle finger ulnar border mass excision,left thumb volar retinacular ganglion cyst excision;  Surgeon: Gomez Cleverly, MD;  Location: [facility];  Service: Orthopedics;  Laterality: Left;   TONSILLECTOMY  2000   adenoids removed   TOTAL KNEE ARTHROPLASTY Left 03/12/2017   Procedure: LEFT TOTAL KNEE ARTHROPLASTY;  Surgeon: Kathryne Hitch, MD;  Location: [facility];  Service: Orthopedics;  Laterality: Left;  Adductor Block   TRIGGER FINGER RELEASE Left 03/17/2021   Procedure: left trigger thumb release;  Surgeon: Gomez Cleverly, MD;  Location: [facility];  Service: Orthopedics;  Laterality: Left;  with local anesthesia   Past Surgical History:  Procedure Laterality Date   ABDOMINAL HYSTERECTOMY  1989   ovaries left   APPENDECTOMY  1989   BREAST LUMPECTOMY WITH RADIOACTIVE SEED LOCALIZATION Left 06/14/2019   Procedure: LEFT BREAST LUMPECTOMY WITH RADIOACTIVE SEED LOCALIZATION X 2;   Surgeon: Abigail Miyamoto, MD;  Location: [facility];  Service: General;  Laterality: Left;  LMA   CHOLECYSTECTOMY  1989   IR GENERIC HISTORICAL  11/07/2015   IR ANGIO INTRA  EXTRACRAN SEL COM CAROTID INNOMINATE BILAT MOD SED 11/07/2015 Julieanne Cotton, MD [facility]   IR GENERIC HISTORICAL  11/07/2015   IR ANGIO VERTEBRAL SEL SUBCLAVIAN INNOMINATE UNI R MOD SED 11/07/2015 Julieanne Cotton, MD [facility]   IR GENERIC HISTORICAL  11/07/2015   IR ANGIO VERTEBRAL SEL VERTEBRAL UNI L MOD SED 11/07/2015 Julieanne Cotton, MD [facility]   IR GENERIC HISTORICAL  11/07/2015   IR ANGIOGRAM EXTREMITY LEFT 11/07/2015 Julieanne Cotton, MD [facility]   MASS EXCISION Left 03/17/2021   Procedure: EXCISION MASS,left middle finger ulnar border mass excision,left thumb volar retinacular ganglion cyst excision;  Surgeon: Gomez Cleverly, MD;  Location[facility];  Service: Orthopedics;  Laterality: Left;   TONSILLECTOMY  2000   adenoids removed   TOTAL KNEE ARTHROPLASTY Left 03/12/2017   Procedure: LEFT TOTAL KNEE ARTHROPLASTY;  Surgeon: Kathryne Hitch, MD;  Location: [facility];  Service: Orthopedics;  Laterality: Left;  Adductor Block   TRIGGER FINGER RELEASE Left 03/17/2021   Procedure: left trigger thumb release;  Surgeon: Gomez Cleverly, MD;  Location: [facility];  Service: Orthopedics;  Laterality: Left;  with local anesthesia   Past Medical History:  Diagnosis Date   Ambulates with cane 03/13/2021   Anxiety    Arthritis    Asthma    CHF (congestive heart failure) (HCC)    COPD (chronic obstructive pulmonary disease) (HCC)    Coronary artery disease    Depression    Diabetes mellitus without complication (HCC)    type 2    Fibromyalgia    Gallstones    GERD (gastroesophageal reflux disease)    HLD (hyperlipidemia)    Hypertension    Lupus (HCC)    Migraines    Myocardial infarction (HCC) 2017   Neuromuscular disorder (HCC)    Osteoporosis     Pancreatitis 05/27/2019   none since   Peripheral neuropathy 03/13/2021   all over pt pt   Shingles    Sleep apnea    sleep study 03-03-2017 mild osa no cpap used per pt   Stroke (HCC) 10/2015   no residual from   Uses walker 03/13/2021   Wears glasses    There were no vitals taken for this visit.  Opioid Risk Score:   Fall Risk Score:  `1  Depression screen [facility] 2/9     09/24/2022   10:50 AM 08/26/2022    9:29 AM 07/29/2022   10:54 AM 06/30/2022    8:37 AM 06/15/2022    8:55 AM 03/13/2022    9:21 AM 03/13/2022    8:01 AM  Depression screen PHQ 2/9  Decreased Interest 0 0 2 0 0 0 0  Down, Depressed, Hopeless 0 0 0 0 0 0 0  PHQ - 2 Score 0 0 2 0 0 0 0  Altered sleeping 2  3   1 1   Tired, decreased energy 2  1   1 1   Change in appetite 0  1   0 0  Feeling bad or failure about yourself  0  0   0 0  Trouble concentrating 0  0   0 0  Moving slowly or fidgety/restless 0  0   0 0  Suicidal thoughts 0  0   0 0  PHQ-9 Score 4  7   2 2   Difficult doing work/chores Not difficult at all     Not difficult at all Not difficult at all     Review of Systems  Musculoskeletal:  Positive for back pain.       B/L leg, knee. Arm, hip, hand pain LT shoulder  All other systems reviewed and are negative.      Objective:   Physical Exam  PE: Constitution: Appropriate appearance for age. No apparent distress. +Obese Resp: No respiratory distress. No accessory muscle usage. on RA and CTAB Cardio: Well perfused appearance. + LLE edema 1+, RLE edema trace Abdomen: Nondistended. Nontender.   Psych: Appropriate mood and affect. Neuro: AAOx4. No apparent cognitive deficits    Neurologic Exam:   DTRs: Reflexes were 2+ in bilateral achilles, patella, biceps, BR and triceps. Babinsky: flexor responses b/l.   Hoffmans: negative b/l Sensory exam: revealed normal sensation in all dermatomal regions in bilateral lower extremities Motor exam: strength 5/5 throughout bilateral lower  extremities Coordination: Fine motor coordination was normal.   Gait: +antalgic gait, with cane in L hand   Knees: +TTP lateral right knee joint, medial joint, and posterior fossa. +Fullness on R posterior knee.  AROM limited in R knee extension d/t pain     Assessment & Plan:   Tammy Strickland is a 62 y.o. year old female  who  has a past medical history of Ambulates with cane (03/13/2021), Anxiety, Arthritis, Asthma, CHF (congestive heart failure) (HCC), COPD (chronic obstructive pulmonary disease) (HCC), Coronary artery disease, Depression, Diabetes mellitus without complication (HCC), Fibromyalgia, Gallstones, GERD (gastroesophageal reflux disease), HLD (hyperlipidemia), Hypertension, Lupus (HCC), Migraines, Myocardial infarction (HCC) (2017), Neuromuscular disorder (HCC), Osteoporosis, Pancreatitis (05/27/2019), Peripheral neuropathy (03/13/2021), Shingles, Sleep apnea, Stroke (HCC) (10/2015), Uses walker (03/13/2021), and Wears glasses.      They are presenting to PM&R clinic for follow up related to pain in low back, L shoulder and R knee  .  Chronic pain syndrome Encounter for therapeutic drug monitoring Encounter for long-term use of opiate analgesic  Refilled Percocet 10 mg #120 tabs for 3 months; OK to increase to Q4H instead of Q6H since she uses more on days she is active, is still staying within overall pill counts monthly.  Follow up with myself or Riley Lam in 3 months  Unilateral primary osteoarthritis, right knee Pending R knee replacement with Dr. Linna Caprice as above; will be getting PT afterward  Continue use of walker for mobility due to instability and recent fall  Sleeping difficulty Continue Elavil Increase Tizanidine to 4 mg TID due to aching pain interfering with sleep; reduce if any side effects  Currently attempting to quit using tobacco Doing awesome with patched ,down to a pack every 3 days; Keep it up!  Chronic left shoulder pain Xray re-ordered; follow up  next visit

## 2022-11-25 NOTE — Patient Instructions (Signed)
Chronic pain syndrome Encounter for therapeutic drug monitoring Encounter for long-term use of opiate analgesic  Refilled Percocet 10 mg #120 tabs for 3 months; OK to increase to Q4H instead of Q6H since she uses more on days she is active, is still staying within overall pill counts monthly.  Follow up with myself or Riley Lam in 3 months  Unilateral primary osteoarthritis, right knee Pending R knee replacement with Dr. Linna Caprice as above; will be getting PT afterward  Continue use of walker for mobility due to instability and recent fall  Sleeping difficulty Continue Elavil Increase Tizanidine to 4 mg TID due to aching pain interfering with sleep; reduce if any side effects  Currently attempting to quit using tobacco Doing awesome with patched ,down to a pack every 3 days; Keep it up!  Chronic left shoulder pain Xray re-ordered; follow up next visit

## 2022-11-26 ENCOUNTER — Encounter (INDEPENDENT_AMBULATORY_CARE_PROVIDER_SITE_OTHER): Payer: Self-pay

## 2022-12-03 ENCOUNTER — Telehealth: Payer: Self-pay | Admitting: Internal Medicine

## 2022-12-03 NOTE — Telephone Encounter (Signed)
Surgical clearance form received via fax from [facility] & placed in provider's box up front. Please fax back to [phone] when complete.

## 2022-12-04 ENCOUNTER — Encounter: Payer: Self-pay | Admitting: Physical Medicine and Rehabilitation

## 2022-12-04 NOTE — Telephone Encounter (Signed)
Patient has appointment on 12/15/22.  Will hold on to for for Dr. Lawerance Bach to complete after visit.

## 2022-12-05 ENCOUNTER — Encounter (HOSPITAL_COMMUNITY): Payer: Self-pay

## 2022-12-05 ENCOUNTER — Emergency Department (HOSPITAL_COMMUNITY)
Admission: EM | Admit: 2022-12-05 | Discharge: 2022-12-05 | Disposition: A | Discharge status: Home / Self Care | Payer: 59 | Attending: Emergency Medicine | Admitting: Emergency Medicine

## 2022-12-05 ENCOUNTER — Emergency Department (HOSPITAL_COMMUNITY): Payer: 59

## 2022-12-05 ENCOUNTER — Other Ambulatory Visit: Payer: Self-pay

## 2022-12-05 DIAGNOSIS — Z79899 Other long term (current) drug therapy: Secondary | ICD-10-CM | POA: Insufficient documentation

## 2022-12-05 DIAGNOSIS — Z794 Long term (current) use of insulin: Secondary | ICD-10-CM | POA: Insufficient documentation

## 2022-12-05 DIAGNOSIS — I509 Heart failure, unspecified: Secondary | ICD-10-CM | POA: Insufficient documentation

## 2022-12-05 DIAGNOSIS — Z743 Need for continuous supervision: Secondary | ICD-10-CM | POA: Diagnosis not present

## 2022-12-05 DIAGNOSIS — R6889 Other general symptoms and signs: Secondary | ICD-10-CM | POA: Diagnosis not present

## 2022-12-05 DIAGNOSIS — R0789 Other chest pain: Secondary | ICD-10-CM | POA: Diagnosis not present

## 2022-12-05 DIAGNOSIS — R079 Chest pain, unspecified: Secondary | ICD-10-CM | POA: Diagnosis not present

## 2022-12-05 DIAGNOSIS — R071 Chest pain on breathing: Secondary | ICD-10-CM | POA: Diagnosis not present

## 2022-12-05 DIAGNOSIS — Z7982 Long term (current) use of aspirin: Secondary | ICD-10-CM | POA: Insufficient documentation

## 2022-12-05 DIAGNOSIS — I110 Hypertensive heart disease with heart failure: Secondary | ICD-10-CM | POA: Insufficient documentation

## 2022-12-05 DIAGNOSIS — I2510 Atherosclerotic heart disease of native coronary artery without angina pectoris: Secondary | ICD-10-CM | POA: Insufficient documentation

## 2022-12-05 DIAGNOSIS — I499 Cardiac arrhythmia, unspecified: Secondary | ICD-10-CM | POA: Diagnosis not present

## 2022-12-05 LAB — BASIC METABOLIC PANEL
Anion gap: 14 (ref 5–15)
BUN: 6 mg/dL (ref 6–20)
CO2: 26 mmol/L (ref 22–32)
Calcium: 8.6 mg/dL — ABNORMAL LOW (ref 8.9–10.3)
Chloride: 99 mmol/L (ref 98–111)
Creatinine, Ser: 0.72 mg/dL (ref 0.44–1.00)
GFR, Estimated: >60 mL/min (ref >60)
Glucose, Bld: 95 mg/dL (ref 70–99)
Potassium: 4 mmol/L (ref 3.5–5.1)
Sodium: 139 mmol/L (ref 135–145)

## 2022-12-05 LAB — TROPONIN I (HIGH SENSITIVITY): Troponin I (High Sensitivity): 5 ng/L (ref <18)

## 2022-12-05 LAB — CBC
HCT: 34.4 % — ABNORMAL LOW (ref 36.0–46.0)
Hemoglobin: 10.5 g/dL — ABNORMAL LOW (ref 12.0–15.0)
MCH: 22.7 pg — ABNORMAL LOW (ref 26.0–34.0)
MCHC: 30.5 g/dL (ref 30.0–36.0)
MCV: 74.5 fL — ABNORMAL LOW (ref 80.0–100.0)
Platelets: 338 10*3/uL (ref 150–400)
RBC: 4.62 MIL/uL (ref 3.87–5.11)
RDW: 19.9 % — ABNORMAL HIGH (ref 11.5–15.5)
WBC: 8.2 10*3/uL (ref 4.0–10.5)
nRBC: 0 % (ref 0.0–0.2)

## 2022-12-05 LAB — CBG MONITORING, ED: Glucose-Capillary: 96 mg/dL (ref 70–99)

## 2022-12-05 MED ORDER — NAPROXEN 500 MG PO TABS: 500.0000 mg | ORAL_TABLET | Freq: Two times a day (BID) | ORAL | 0 refills | Status: DC

## 2022-12-05 MED ORDER — OXYCODONE-ACETAMINOPHEN 5-325 MG PO TABS
1.0000 | ORAL_TABLET | Freq: Once | ORAL | Status: AC
  Administered 2022-12-05: 1 via ORAL
  Filled 2022-12-05: qty 1

## 2022-12-05 MED ORDER — NAPROXEN 250 MG PO TABS
500.0000 mg | ORAL_TABLET | Freq: Once | ORAL | Status: AC
  Administered 2022-12-05: 500 mg via ORAL
  Filled 2022-12-05: qty 2

## 2022-12-05 MED ORDER — MORPHINE SULFATE (PF) 4 MG/ML IV SOLN
4.0000 mg | Freq: Once | INTRAVENOUS | Status: AC
  Administered 2022-12-05: 4 mg via INTRAVENOUS
  Filled 2022-12-05: qty 1

## 2022-12-05 NOTE — ED Triage Notes (Signed)
Pt BIB [facility] from home c/o intermittent centralized CP that has been ongoing for several days and radiates into her back. Pt also states the pain is worse when she is lying flat or bending forward. Pt denies SHOB and vomiting but has been nauseous. Pt received 324 ASA and 0.4 nitroglycerin but that made the pain worse and elevated her BP.

## 2022-12-05 NOTE — ED Provider Notes (Addendum)
[place] EMERGENCY DEPARTMENT AT [facility] Provider Note   CSN: [id number] Arrival date & time: 12/05/22  1059     History  Chief Complaint  Patient presents with   Chest Pain    Tammy Strickland is a 62 y.o. female.  HPI    62 year old patient comes in with chief complaint of chest pain.  Patient has history of COPD, chronic pain syndrome, CHF, hypertension, fibromyalgia, and reported history of CAD, however there is no history of stent placement.  Patient comes in with 4-day history of chest pain.  Chest pain is located in the center part of her chest, slightly to the left and it radiates to the back.  Pain is described as constant, pressure-like feeling.  The pain is not worse with any exertion.  The pain is worse with her moving her upper extremity, leaning forward and when she is laying flat.  Anytime she tries to get up, the pain will get worse.  She denies any associated shortness of breath.  No history of similar pain in the past.  Per EMS, patient was given nitroglycerin and aspirin.  Patient states that nitroglycerin made her pain worse.  There is no history of PE, DVT. In patient's chart, there is a mention of lupus history.  Patient however denies any known history of lupus and is not any medications for it.  Home Medications Prior to Admission medications   Medication Sig Start Date End Date Taking? Authorizing Provider  Accu-Chek Softclix Lancets lancets ONE EACH BY OTHER ROUTE FOUR TIMES DAILY. 05/18/22   Pincus Sanes, MD  albuterol (VENTOLIN HFA) 108 (90 Base) MCG/ACT inhaler Inhale 2 puffs into the lungs every 6 (six) hours as needed for wheezing or shortness of breath. 10/01/20   Pincus Sanes, MD  amitriptyline (ELAVIL) 75 MG tablet Take 1 tablet by mouth at bedtime 06/08/22   Pincus Sanes, MD  ascorbic acid (VITAMIN C) 500 MG tablet Take 1 tablet (500 mg total) by mouth daily. 09/04/20   Burnadette Pop, MD  aspirin EC 81 MG tablet Take 1 tablet  (81 mg total) by mouth daily. 11/01/15   Elgergawy, Leana Roe, MD  atorvastatin (LIPITOR) 20 MG tablet Take 1 tablet by mouth every day 07/20/22   Pincus Sanes, MD  Cholecalciferol (VITAMIN D3) 50 MCG (2000 UT) capsule Take 1 capsule (2,000 Units total) by mouth daily. 01/28/21   Pincus Sanes, MD  cloNIDine (CATAPRES) 0.3 MG tablet Take 1 tablet by mouth 3 times a day 11/09/22   Pincus Sanes, MD  Continuous Glucose Sensor (DEXCOM G7 SENSOR) MISC Use 1 sensor for continuous glucose monitoring every 10 days 30 days 09/28/22   Ocie Cornfield, MD  fluticasone [facility]) 50 MCG/ACT nasal spray Shake liquid & use 1 spray into each nostril every day as needed for allergies 02/09/22   Pincus Sanes, MD  fluticasone furoate-vilanterol (BREO ELLIPTA) 100-25 MCG/INH AEPB Inhale 1 puff into the lungs daily. 10/01/20   Pincus Sanes, MD  furosemide (LASIX) 80 MG tablet Take 1 tablet by mouth every day 02/09/22   Pincus Sanes, MD  glucose blood (ACCU-CHEK GUIDE) test strip TEST TWICE DAILY 05/18/22   Pincus Sanes, MD  hydrOXYzine (ATARAX/VISTARIL) 25 MG tablet Take 25 mg by mouth at bedtime. 12/17/20   [provider]  insulin glargine, 2 Unit Dial, (TOUJEO MAX SOLOSTAR) 300 UNIT/ML Solostar Pen Inject 160 Units into the skin every evening. And pen needles 1/day 09/24/22  Pincus Sanes, MD  Insulin Pen Needle (PEN NEEDLES) 32G X 5 MM MISC UAD daily for insulin 09/24/22   Pincus Sanes, MD  KLOR-CON M10 10 MEQ tablet Take 2 tablets by mouth every day 02/09/22   Pincus Sanes, MD  losartan (COZAAR) 50 MG tablet Take 1 tablet by mouth twice daily 02/09/22   Pincus Sanes, MD  metoprolol (TOPROL-XL) 200 MG 24 hr tablet Take 1 tablet by mouth twice daily 02/09/22   Pincus Sanes, MD  [facility] 4 MG/0.1ML LIQD nasal spray kit Place 1 spray into the nose once. 11/17/19   [provider]  nicotine (NICODERM CQ - DOSED IN MG/24 HR) 7 mg/24hr patch Place 1 patch (7 mg total) onto the skin daily. 09/24/22    Pincus Sanes, MD  nitroGLYCERIN (NITROSTAT) 0.4 MG SL tablet Place 0.4 mg under the tongue every 5 (five) minutes as needed for chest pain.    [provider]  ondansetron (ZOFRAN) 4 MG tablet Take 4 mg by mouth every 8 (eight) hours as needed. 11/07/21   [provider]  oxyCODONE-acetaminophen (PERCOCET) 10-325 MG tablet Take 1 tablet by mouth every 4 (four) hours as needed for pain. 11/30/22 12/30/22  Angelina Sheriff, DO  oxyCODONE-acetaminophen (PERCOCET) 10-325 MG tablet Take 1 tablet by mouth every 4 (four) hours as needed for pain. 12/30/22 01/29/23  Angelina Sheriff, DO  oxyCODONE-acetaminophen (PERCOCET) 10-325 MG tablet Take 1 tablet by mouth every 4 (four) hours as needed for pain. 01/29/23 02/28/23  Angelina Sheriff, DO  pantoprazole (PROTONIX) 40 MG tablet Take 1 tablet by mouth twice daily 07/29/22   Pincus Sanes, MD  [facility] 80-4.5 MCG/ACT inhaler Inhale 2 puffs into the lungs daily as needed 07/20/22   Pincus Sanes, MD  tirzepatide [facility]) 5 MG/0.5ML Pen Inject 5 mg into the skin once a week. 09/04/22     tiZANidine (ZANAFLEX) 4 MG tablet Take 1 tablet (4 mg total) by mouth every 8 (eight) hours as needed for muscle spasms. 11/25/22   Elijah Birk C, DO  triamcinolone cream (KENALOG) 0.1 % Apply 1 Application topically 2 (two) times daily. 09/24/22   Burns, Bobette Mo, MD  ZTLIDO 1.8 % PTCH Apply 1 patch topically daily as needed (knee pain). 06/05/20   [provider]      Allergies    Bacitracin-polymyxin b, Metformin and related, Other, Ozempic (0.25 or 0.5 mg-dose) [semaglutide(0.25 or 0.5mg -dos)], Quinolones, and Bacitracin    Review of Systems   Review of Systems  All other systems reviewed and are negative.   Physical Exam Updated Vital Signs BP 117/87 (BP Location: Left Arm)   Pulse 63   Temp 98.2 F (36.8 C) (Oral)   Resp 20   Ht 5\' 6"  (1.676 m)   Wt 95.3 kg   SpO2 99%   BMI 33.89 kg/m  Physical Exam Vitals and nursing note  reviewed.  Constitutional:      Appearance: She is well-developed.  HENT:     Head: Atraumatic.  Cardiovascular:     Rate and Rhythm: Normal rate.     Heart sounds: Normal heart sounds.  Pulmonary:     Effort: Pulmonary effort is normal.  Chest:     Comments: Patient has reproducible chest wall tenderness with pulling and pushing. She also has chest wall tenderness with flexion of the pec muscles Musculoskeletal:     Cervical back: Normal range of motion and neck supple.  Skin:  General: Skin is warm and dry.  Neurological:     Mental Status: She is alert and oriented to person, place, and time.     ED Results / Procedures / Treatments   Labs (all labs ordered are listed, but only abnormal results are displayed) Labs Reviewed  CBC - Abnormal; Notable for the following components:      Result Value   Hemoglobin 10.5 (*)    HCT 34.4 (*)    MCV 74.5 (*)    MCH 22.7 (*)    RDW 19.9 (*)    All other components within normal limits  BASIC METABOLIC PANEL  CBG MONITORING, ED  TROPONIN I (HIGH SENSITIVITY)  TROPONIN I (HIGH SENSITIVITY)    EKG EKG Interpretation Date/Time:  Saturday December 05 2022 11:22:30 EDT Ventricular Rate:  60 PR Interval:  171 QRS Duration:  97 QT Interval:  479 QTC Calculation: 479 R Axis:   4  Text Interpretation: Sinus rhythm Low voltage, precordial leads No acute changes No significant change since last tracing Confirmed by Derwood Kaplan [phone]) on 12/05/2022 11:36:57 AM  Radiology DG Chest Portable 1 View  Result Date: 12/05/2022 CLINICAL DATA:  Left chest pain EXAM: PORTABLE CHEST 1 VIEW COMPARISON:  08/31/2020 and CT chest 02/12/2022 FINDINGS: Mild enlargement of the cardiopericardial silhouette. Indistinct pulmonary vasculature raising the possibility of pulmonary venous hypertension. No overt edema or Kerley B lines identified. Mild spurring of both humeral heads.  Thoracic spondylosis. No blunting of the costophrenic angles. No  discrete airspace opacity. IMPRESSION: 1. Mild enlargement of the cardiopericardial silhouette with indistinct pulmonary vasculature raising the possibility of pulmonary venous hypertension. No overt edema or Kerley B lines identified. Electronically Signed   By: Gaylyn Rong M.D.   On: 12/05/2022 12:47    Procedures Procedures    Medications Ordered in ED Medications  morphine (PF) 4 MG/ML injection 4 mg (4 mg Intravenous Given 12/05/22 1155)  oxyCODONE-acetaminophen (PERCOCET/ROXICET) 5-325 MG per tablet 1 tablet (1 tablet Oral Given 12/05/22 1341)  naproxen (NAPROSYN) tablet 500 mg (500 mg Oral Given 12/05/22 1341)    ED Course/ Medical Decision Making/ A&P                                 Medical Decision Making Amount and/or Complexity of Data Reviewed Labs: ordered. Radiology: ordered.  Risk Prescription drug management.   This patient presents to the ED with chief complaint(s) of chest pain that is constant for the last 3 days with radiation of the pain to her back with pertinent past medical history of COPD, CHF, chronic pain syndrome and fibromyalgia.The complaint involves an extensive differential diagnosis and also carries with it a high risk of complications and morbidity.    The differential diagnosis includes : Acute coronary syndrome, acute pulmonary embolism, pulmonary infarct, costochondritis, chest wall pain, pneumonia, pneumothorax, pericarditis.  The initial plan is to get delta troponin, basic labs.  Atypical for ACS at this time.  No PE risk factors.  Patient has no known history of thrombosis either.  Given that the pain is overtly reproducible with movement of the upper extremity and with patient laying flat, leaning forward -this appears to be most likely a musculoskeletal cause for the pain.  I have lower suspicion for conditions like pericarditis and pulmonary embolism/pulmonary infarct at this time.  Additional history obtained: Additional history  obtained from EMS  Records reviewed Primary Care Documents  Independent labs interpretation:  The following labs were independently interpreted: CBC is normal.  Independent visualization and interpretation of imaging: - I independently visualized the following imaging with scope of interpretation limited to determining acute life threatening conditions related to emergency care: X-ray of the chest, which revealed no evidence of pneumothorax. Per radiologist, there could be possible pulmonary hypertension.  Treatment and Reassessment: Patient reassessed.  She indicates that morphine made her " mad".  Nitro did not help her in the past.  Will give her Percocet and Naprosyn right now. Labs are still pending.  3:39 PM The patient appears reasonably screened and/or stabilized for discharge and I doubt any other medical condition or other [facility] requiring further screening, evaluation, or treatment in the ED at this time prior to discharge.   Results from the ER workup discussed with the patient face to face and all questions answered to the best of my ability. The patient is safe for discharge with strict return precautions.   Final Clinical Impression(s) / ED Diagnoses Final diagnoses:  Chest wall pain  Costochondral chest pain    Rx / DC Orders ED Discharge Orders     None         Derwood Kaplan, MD 12/05/22 1345    Derwood Kaplan, MD 12/05/22 1539

## 2022-12-05 NOTE — Discharge Instructions (Addendum)
You are seen in the emergency room for chest pain.  The workup in the emergency room is overall reassuring.  There is no evidence of heart attack and the x-ray shows no evidence of pneumonia.  We suspect that this is most likely chest wall pain.  In addition to your chronic pain medication, take Naprosyn twice a day.  Apply ice to the affected area.  Return to the emergency room if you start having worsening chest pain, pain radiating to your jaw or neck or down the upper extremity or associated shortness of breath, sweating.

## 2022-12-06 DIAGNOSIS — E1165 Type 2 diabetes mellitus with hyperglycemia: Secondary | ICD-10-CM | POA: Diagnosis not present

## 2022-12-12 ENCOUNTER — Other Ambulatory Visit: Payer: Self-pay | Admitting: Internal Medicine

## 2022-12-14 ENCOUNTER — Encounter: Payer: Self-pay | Admitting: Internal Medicine

## 2022-12-14 NOTE — Progress Notes (Deleted)
Subjective:    Patient ID: Tammy Strickland, female    DOB: 16-Sep-1960, 62 y.o.   MRN: [id number]     HPI [facility] is here for follow up of her chronic medical problems.  Toujeo dose ?  160 u / day  Medications and allergies reviewed with patient and updated if appropriate.  Current Outpatient Medications on File Prior to Visit  Medication Sig Dispense Refill   Accu-Chek Softclix Lancets lancets ONE EACH BY OTHER ROUTE FOUR TIMES DAILY. 100 each 11   albuterol (VENTOLIN HFA) 108 (90 Base) MCG/ACT inhaler Inhale 2 puffs into the lungs every 6 (six) hours as needed for wheezing or shortness of breath. 8 g 8   amitriptyline (ELAVIL) 75 MG tablet Take 1 tablet by mouth at bedtime 30 tablet 11   ascorbic acid (VITAMIN C) 500 MG tablet Take 1 tablet (500 mg total) by mouth daily. 7 tablet 0   aspirin EC 81 MG tablet Take 1 tablet (81 mg total) by mouth daily.     atorvastatin (LIPITOR) 20 MG tablet Take 1 tablet by mouth every day 30 tablet 11   Cholecalciferol (VITAMIN D3) 50 MCG (2000 UT) capsule Take 1 capsule (2,000 Units total) by mouth daily. 90 capsule 1   cloNIDine (CATAPRES) 0.3 MG tablet Take 1 tablet by mouth 3 times a day 90 tablet 5   Continuous Glucose Sensor (DEXCOM G7 SENSOR) MISC Use 1 sensor for continuous glucose monitoring every 10 days 30 days 3 each 5   fluticasone (FLONASE) 50 MCG/ACT nasal spray Shake liquid & use 1 spray into each nostril every day as needed for allergies 16 g 11   fluticasone furoate-vilanterol (BREO ELLIPTA) 100-25 MCG/INH AEPB Inhale 1 puff into the lungs daily. 1 each 11   furosemide (LASIX) 80 MG tablet Take 1 tablet by mouth every day 30 tablet 11   glucose blood (ACCU-CHEK GUIDE) test strip TEST TWICE DAILY 200 strip 11   hydrOXYzine (ATARAX/VISTARIL) 25 MG tablet Take 25 mg by mouth at bedtime.     insulin glargine, 2 Unit Dial, (TOUJEO MAX SOLOSTAR) 300 UNIT/ML Solostar Pen Inject 160 Units into the skin every evening. And pen needles  1/day 45 mL 3   Insulin Pen Needle (PEN NEEDLES) 32G X 5 MM MISC UAD daily for insulin 90 each 3   KLOR-CON M10 10 MEQ tablet Take 2 tablets by mouth every day 60 tablet 11   losartan (COZAAR) 50 MG tablet Take 1 tablet by mouth twice daily 60 tablet 11   metoprolol (TOPROL-XL) 200 MG 24 hr tablet Take 1 tablet by mouth twice daily 60 tablet 11   naproxen (NAPROSYN) 500 MG tablet Take 1 tablet (500 mg total) by mouth 2 (two) times daily. 20 tablet 0   NARCAN 4 MG/0.1ML LIQD nasal spray kit Place 1 spray into the nose once.     nicotine (NICODERM CQ - DOSED IN MG/24 HR) 7 mg/24hr patch Place 1 patch (7 mg total) onto the skin daily. 28 patch 0   nitroGLYCERIN (NITROSTAT) 0.4 MG SL tablet Place 0.4 mg under the tongue every 5 (five) minutes as needed for chest pain.     ondansetron (ZOFRAN) 4 MG tablet Take 4 mg by mouth every 8 (eight) hours as needed.     oxyCODONE-acetaminophen (PERCOCET) 10-325 MG tablet Take 1 tablet by mouth every 4 (four) hours as needed for pain. 120 tablet 0   [START ON 12/30/2022] oxyCODONE-acetaminophen (PERCOCET) 10-325 MG tablet Take 1  tablet by mouth every 4 (four) hours as needed for pain. 120 tablet 0   [START ON 01/29/2023] oxyCODONE-acetaminophen (PERCOCET) 10-325 MG tablet Take 1 tablet by mouth every 4 (four) hours as needed for pain. 120 tablet 0   pantoprazole (PROTONIX) 40 MG tablet Take 1 tablet by mouth twice daily 60 tablet 11   SYMBICORT 80-4.5 MCG/ACT inhaler Inhale 2 puffs into the lungs daily as needed 10.2 each 11   tirzepatide (MOUNJARO) 5 MG/0.5ML Pen Inject 5 mg into the skin once a week. 2 mL 5   tiZANidine (ZANAFLEX) 4 MG tablet Take 1 tablet (4 mg total) by mouth every 8 (eight) hours as needed for muscle spasms. 90 tablet 3   triamcinolone cream (KENALOG) 0.1 % Apply 1 Application topically 2 (two) times daily. 30 g 0   ZTLIDO 1.8 % PTCH Apply 1 patch topically daily as needed (knee pain).     No current facility-administered medications on file  prior to visit.     Review of Systems     Objective:  There were no vitals filed for this visit. BP Readings from Last 3 Encounters:  12/05/22 126/74  11/25/22 (!) 142/85  09/24/22 134/78   Wt Readings from Last 3 Encounters:  12/05/22 210 lb (95.3 kg)  11/25/22 281 lb (127.5 kg)  09/24/22 278 lb (126.1 kg)   There is no height or weight on file to calculate BMI.    Physical Exam     Lab Results  Component Value Date   WBC 8.2 12/05/2022   HGB 10.5 (L) 12/05/2022   HCT 34.4 (L) 12/05/2022   PLT 338 12/05/2022   GLUCOSE 95 12/05/2022   CHOL 149 06/02/2022   TRIG 112.0 06/02/2022   HDL 65.20 06/02/2022   LDLDIRECT 101.0 09/19/2019   LDLCALC 61 06/02/2022   ALT 17 06/02/2022   AST 17 06/02/2022   NA 139 12/05/2022   K 4.0 12/05/2022   CL 99 12/05/2022   CREATININE 0.72 12/05/2022   BUN 6 12/05/2022   CO2 26 12/05/2022   TSH 5.02 01/07/2021   INR 1.06 11/07/2015   HGBA1C 8.0 (A) 09/24/2022   HGBA1C 8.0 09/24/2022   HGBA1C 8.0 (A) 09/24/2022   HGBA1C 8.0 (A) 09/24/2022   MICROALBUR 2.5 (H) 06/02/2022     Assessment & Plan:    See Problem List for Assessment and Plan of chronic medical problems.

## 2022-12-15 ENCOUNTER — Ambulatory Visit: Payer: 59 | Admitting: Internal Medicine

## 2022-12-15 DIAGNOSIS — J449 Chronic obstructive pulmonary disease, unspecified: Secondary | ICD-10-CM

## 2022-12-15 DIAGNOSIS — I2510 Atherosclerotic heart disease of native coronary artery without angina pectoris: Secondary | ICD-10-CM

## 2022-12-15 DIAGNOSIS — I10 Essential (primary) hypertension: Secondary | ICD-10-CM

## 2022-12-15 DIAGNOSIS — K219 Gastro-esophageal reflux disease without esophagitis: Secondary | ICD-10-CM

## 2022-12-15 DIAGNOSIS — G4700 Insomnia, unspecified: Secondary | ICD-10-CM

## 2022-12-15 DIAGNOSIS — E782 Mixed hyperlipidemia: Secondary | ICD-10-CM

## 2022-12-15 DIAGNOSIS — Z01818 Encounter for other preprocedural examination: Secondary | ICD-10-CM

## 2022-12-15 DIAGNOSIS — I5032 Chronic diastolic (congestive) heart failure: Secondary | ICD-10-CM

## 2022-12-15 DIAGNOSIS — E1142 Type 2 diabetes mellitus with diabetic polyneuropathy: Secondary | ICD-10-CM

## 2022-12-15 NOTE — Assessment & Plan Note (Deleted)
Chronic Controlled Continue maintenance inhaler-Symbicort 2 puffs daily prn - does not need daily Continue albuterol as needed Stressed smoking cessation

## 2022-12-15 NOTE — Assessment & Plan Note (Deleted)
Chronic Euvolemic Continue lasix 80 mg daly, metoprolol XL 200 mg twice daily

## 2022-12-15 NOTE — Assessment & Plan Note (Deleted)
Chronic °Regular exercise and healthy diet encouraged °Check lipid panel  °Continue atorvastatin 20 mg daily °

## 2022-12-15 NOTE — Assessment & Plan Note (Deleted)
Chronic Blood pressure adequately controlled CBC, CMP Continue losartan 50 mg twice daily, metoprolol XL 200 mg twice daily, clonidine 0.3 mg 3 times daily

## 2022-12-15 NOTE — Assessment & Plan Note (Deleted)
Chronic Continue amitriptyline 75 mg nightly Continue tizanidine 4 mg nightly

## 2022-12-15 NOTE — Assessment & Plan Note (Deleted)
Chronic Following with endocrine Sugars have been uncontrolled A1c today  Continue Mounjaro to 5 mg weekly-can titrate as tolerated On toujeo 160 units daily Stressed that she needs to be eating protein, vegetables-low carbs and sugars.  Stressed that she needs to be eating regularly

## 2022-12-15 NOTE — Assessment & Plan Note (Deleted)
Chronic S denies any chest pain, shortness of breath, palpitations or lightheadedness No symptoms consistent with ischemia Continue aspirin 81 mg daily, atorvastatin and metoprolol

## 2022-12-15 NOTE — Assessment & Plan Note (Deleted)
Chronic GERD controlled Continue pantoprazole 40 mg twice daily 

## 2022-12-15 NOTE — Assessment & Plan Note (Deleted)
Here for clearance for right total knee arthroplasty Chronic medical problems stable Hypertension controlled Will check A1c to see if it is at goal for surgery No concerning symptoms suggestive of CAD, COPD controlled Has done well with surgery in the past  Low risk for low risk surgery - cleared for surgery.

## 2022-12-16 ENCOUNTER — Telehealth: Payer: Self-pay

## 2022-12-16 NOTE — Telephone Encounter (Signed)
Transition Care Management Unsuccessful Follow-up Telephone Call  Date of discharge and from where:  12/05/2022 [facility]  Attempts:  1st Attempt  Reason for unsuccessful TCM follow-up call:  Left voice message  Tammy Strickland [facility]  [facility] Population Health Community Resource Care Guide   [email or web address]  ?? [phone]   Website: [email or web address]  [email or web address]

## 2022-12-17 ENCOUNTER — Other Ambulatory Visit: Payer: Self-pay

## 2022-12-17 ENCOUNTER — Telehealth: Payer: Self-pay

## 2022-12-17 ENCOUNTER — Encounter: Payer: Self-pay | Admitting: Internal Medicine

## 2022-12-17 ENCOUNTER — Other Ambulatory Visit (HOSPITAL_COMMUNITY): Payer: Self-pay

## 2022-12-17 NOTE — Telephone Encounter (Signed)
Transition Care Management Unsuccessful Follow-up Telephone Call  Date of discharge and from where:  12/04/2022  Attempts:  2nd Attempt  Reason for unsuccessful TCM follow-up call:  Voice mail full  Leray Garverick [facility]  [facility] Population Health Community Resource Care Guide   [email or web address]  ?? [phone]   Website: [email or web address]  [email or web address]

## 2022-12-23 ENCOUNTER — Telehealth: Payer: Self-pay | Admitting: Internal Medicine

## 2022-12-23 NOTE — Telephone Encounter (Signed)
We have received surgical clearance from [facility] and it has been placed in provider's box. Please fax to [phone].

## 2022-12-23 NOTE — Telephone Encounter (Signed)
Please disregard previous fax number. It needs to be faxed to [phone].

## 2022-12-24 ENCOUNTER — Ambulatory Visit: Payer: 59 | Admitting: Internal Medicine

## 2022-12-24 NOTE — Telephone Encounter (Signed)
Yes just an A1c is ok

## 2022-12-24 NOTE — Telephone Encounter (Signed)
Spoke with patient today.   She states she was suppose to have an appointment for tomorrow at 8:30.  I told her she is not on the schedule for tomorrow and that her appointment had been cancelled.  She is already cleared for surgery but her A1C was to high at the time.  She wants to know if she can't be seen early in the morning, can she just come in tomorrow for A1c recheck and we reschedule her for the 3 month follow up?  She states transportation has already been arranged to bring her in the morning.    We had a cancellation at 10:40 am tomorrow but she said transportation is picking her up at 7:30 and she is unable to sit and wait for over 3 hours to be seen.

## 2022-12-25 ENCOUNTER — Ambulatory Visit: Payer: 59 | Admitting: Internal Medicine

## 2022-12-25 ENCOUNTER — Other Ambulatory Visit (INDEPENDENT_AMBULATORY_CARE_PROVIDER_SITE_OTHER): Payer: 59

## 2022-12-25 ENCOUNTER — Other Ambulatory Visit: Payer: Self-pay | Admitting: Internal Medicine

## 2022-12-25 DIAGNOSIS — Z794 Long term (current) use of insulin: Secondary | ICD-10-CM

## 2022-12-25 DIAGNOSIS — E1142 Type 2 diabetes mellitus with diabetic polyneuropathy: Secondary | ICD-10-CM

## 2022-12-25 LAB — HEMOGLOBIN A1C: Hgb A1c MFr Bld: 7.1 % — ABNORMAL HIGH (ref 4.6–6.5)

## 2023-01-06 DIAGNOSIS — E1165 Type 2 diabetes mellitus with hyperglycemia: Secondary | ICD-10-CM | POA: Diagnosis not present

## 2023-01-10 ENCOUNTER — Ambulatory Visit: Payer: Self-pay | Admitting: Student

## 2023-01-10 DIAGNOSIS — E1142 Type 2 diabetes mellitus with diabetic polyneuropathy: Secondary | ICD-10-CM

## 2023-01-15 NOTE — Patient Instructions (Signed)
SURGICAL WAITING ROOM VISITATION  Patients having surgery or a procedure may have no more than 2 support people in the waiting area - these visitors may rotate.    Children under the age of 14 must have an adult with them who is not the patient.  Due to an increase in RSV and influenza rates and associated hospitalizations, children ages 69 and under may not visit patients in [facility] hospitals.  If the patient needs to stay at the hospital during part of their recovery, the visitor guidelines for inpatient rooms apply. Pre-op nurse will coordinate an appropriate time for 1 support person to accompany patient in pre-op.  This support person may not rotate.    Please refer to the [facility] website for the visitor guidelines for Inpatients (after your surgery is over and you are in a regular room).       Your procedure is scheduled on: 01/27/23   Report to [facility] Main Entrance    Report to admitting at 9:10 AM   Call this number if you have problems the morning of surgery [phone]   Do not eat food :After Midnight.   After Midnight you may have the following liquids until 8:40 AM DAY OF SURGERY  Water Non-Citrus Juices (without pulp, NO RED-Apple, White grape, White cranberry) Black Coffee (NO MILK/CREAM OR CREAMERS, sugar ok)  Clear Tea (NO MILK/CREAM OR CREAMERS, sugar ok) regular and decaf                             Plain Jell-O (NO RED)                                           Fruit ices (not with fruit pulp, NO RED)                                     Popsicles (NO RED)                                                               Sports drinks like Gatorade (NO RED)                  The day of surgery:  Drink ONE (1) Pre-Surgery G2 at 8:40 AM the morning of surgery. Drink in one sitting. Do not sip.  This drink was given to you during your hospital  pre-op appointment visit. Nothing else to drink after completing the  Pre-Surgery  G2.     Oral  Hygiene is also important to reduce your risk of infection.                                    Remember - BRUSH YOUR TEETH THE MORNING OF SURGERY WITH YOUR REGULAR TOOTHPASTE  DENTURES WILL BE REMOVED PRIOR TO SURGERY PLEASE DO NOT APPLY "Poly grip" OR ADHESIVES!!!   Stop all vitamins and herbal supplements 7 days before surgery.   Take these medicines the morning  of surgery: Atorvastatin, Clonidine, Metoprolol, Pantoprazole Inhaler, Nasal spray  DO NOT TAKE ANY ORAL DIABETIC MEDICATIONS DAY OF YOUR SURGERY Don't take Moujaro at least 7 days prior to surgery. Last dose before surgery should be Take all of morning Toujeo insulin the day before surgery. But take only half of evening dose the day before surgery. Take only half of morning dose of insulin the Day Of Surgery.  Bring CPAP mask and tubing day of surgery.                              You may not have any metal on your body including hair pins, jewelry, and body piercing             Do not wear make-up, lotions, powders, perfumes/cologne, or deodorant  Do not wear nail polish including gel and S&S, artificial/acrylic nails, or any other type of covering on natural nails including finger and toenails. If you have artificial nails, gel coating, etc. that needs to be removed by a nail salon please have this removed prior to surgery or surgery may need to be canceled/ delayed if the surgeon/ anesthesia feels like they are unable to be safely monitored.   Do not shave  48 hours prior to surgery.    Do not bring valuables to the hospital. [facility] IS NOT             RESPONSIBLE   FOR VALUABLES.   Contacts, glasses, dentures or bridgework may not be worn into surgery.   Bring small overnight bag day of surgery.   DO NOT BRING YOUR HOME MEDICATIONS TO THE HOSPITAL. PHARMACY WILL DISPENSE MEDICATIONS LISTED ON YOUR MEDICATION LIST TO YOU DURING YOUR ADMISSION IN THE HOSPITAL!    Patients discharged on the day of surgery will not be  allowed to drive home.  Someone NEEDS to stay with you for the first 24 hours after anesthesia.   Special Instructions: Bring a copy of your healthcare power of attorney and living will documents the day of surgery if you haven't scanned them before.              Please read over the following fact sheets you were given: IF YOU HAVE QUESTIONS ABOUT YOUR PRE-OP INSTRUCTIONS PLEASE CALL [phone]   If you received a COVID test during your pre-op visit  it is requested that you wear a mask when out in public, stay away from anyone that may not be feeling well and notify your surgeon if you develop symptoms. If you test positive for Covid or have been in contact with anyone that has tested positive in the last 10 days please notify you surgeon.      Pre-operative 5 CHG Bath Instructions   You can play a key role in reducing the risk of infection after surgery. Your skin needs to be as free of germs as possible. You can reduce the number of germs on your skin by washing with CHG (chlorhexidine gluconate) soap before surgery. CHG is an antiseptic soap that kills germs and continues to kill germs even after washing.   DO NOT use if you have an allergy to chlorhexidine/CHG or antibacterial soaps. If your skin becomes reddened or irritated, stop using the CHG and notify one of our RNs at [phone].   Please shower with the CHG soap starting 4 days before surgery using the following schedule:     Please keep in mind  the following:  DO NOT shave, including legs and underarms, starting the day of your first shower.   You may shave your face at any point before/day of surgery.  Place clean sheets on your bed the day you start using CHG soap. Use a clean washcloth (not used since being washed) for each shower. DO NOT sleep with pets once you start using the CHG.   CHG Shower Instructions:  If you choose to wash your hair and private area, wash first with your normal shampoo/soap.  After you use  shampoo/soap, rinse your hair and body thoroughly to remove shampoo/soap residue.  Turn the water OFF and apply about 3 tablespoons (45 ml) of CHG soap to a CLEAN washcloth.  Apply CHG soap ONLY FROM YOUR NECK DOWN TO YOUR TOES (washing for 3-5 minutes)  DO NOT use CHG soap on face, private areas, open wounds, or sores.  Pay special attention to the area where your surgery is being performed.  If you are having back surgery, having someone wash your back for you may be helpful. Wait 2 minutes after CHG soap is applied, then you may rinse off the CHG soap.  Pat dry with a clean towel  Put on clean clothes/pajamas   If you choose to wear lotion, please use ONLY the CHG-compatible lotions on the back of this paper.     Additional instructions for the day of surgery: DO NOT APPLY any lotions, deodorants, cologne, or perfumes.   Put on clean/comfortable clothes.  Brush your teeth.  Ask your nurse before applying any prescription medications to the skin.      CHG Compatible Lotions   Aveeno Moisturizing lotion  Cetaphil Moisturizing Cream  Cetaphil Moisturizing Lotion  Clairol Herbal Essence Moisturizing Lotion, Dry Skin  Clairol Herbal Essence Moisturizing Lotion, Extra Dry Skin  Clairol Herbal Essence Moisturizing Lotion, Normal Skin  Curel Age Defying Therapeutic Moisturizing Lotion with Alpha Hydroxy  Curel Extreme Care Body Lotion  Curel Soothing Hands Moisturizing Hand Lotion  Curel Therapeutic Moisturizing Cream, Fragrance-Free  Curel Therapeutic Moisturizing Lotion, Fragrance-Free  Curel Therapeutic Moisturizing Lotion, Original Formula  Eucerin Daily Replenishing Lotion  Eucerin Dry Skin Therapy Plus Alpha Hydroxy Crme  Eucerin Dry Skin Therapy Plus Alpha Hydroxy Lotion  Eucerin Original Crme  Eucerin Original Lotion  Eucerin Plus Crme Eucerin Plus Lotion  Eucerin TriLipid Replenishing Lotion  Keri Anti-Bacterial Hand Lotion  Keri Deep Conditioning Original Lotion Dry  Skin Formula Softly Scented  Keri Deep Conditioning Original Lotion, Fragrance Free Sensitive Skin Formula  Keri Lotion Fast Absorbing Fragrance Free Sensitive Skin Formula  Keri Lotion Fast Absorbing Softly Scented Dry Skin Formula  Keri Original Lotion  Keri Skin Renewal Lotion Keri Silky Smooth Lotion  Keri Silky Smooth Sensitive Skin Lotion  Nivea Body Creamy Conditioning Oil  Nivea Body Extra Enriched Lotion  Nivea Body Original Lotion  Nivea Body Sheer Moisturizing Lotion Nivea Crme  Nivea Skin Firming Lotion  NutraDerm 30 Skin Lotion  NutraDerm Skin Lotion  NutraDerm Therapeutic Skin Cream  NutraDerm Therapeutic Skin Lotion  ProShield Protective Hand Cream   Incentive Spirometer (Watch this video at home: [email or web address])  An incentive spirometer is a tool that can help keep your lungs clear and active. This tool measures how well you are filling your lungs with each breath. Taking long deep breaths may help reverse or decrease the chance of developing breathing (pulmonary) problems (especially infection) following: A long period of time when you are unable to move or be active. BEFORE  THE PROCEDURE  If the spirometer includes an indicator to show your best effort, your nurse or respiratory therapist will set it to a desired goal. If possible, sit up straight or lean slightly forward. Try not to slouch. Hold the incentive spirometer in an upright position. INSTRUCTIONS FOR USE  Sit on the edge of your bed if possible, or sit up as far as you can in bed or on a chair. Hold the incentive spirometer in an upright position. Breathe out normally. Place the mouthpiece in your mouth and seal your lips tightly around it. Breathe in slowly and as deeply as possible, raising the piston or the ball toward the top of the column. Hold your breath for 3-5 seconds or for as long as possible. Allow the piston or ball to fall to the bottom of the column. Remove  the mouthpiece from your mouth and breathe out normally. Rest for a few seconds and repeat Steps 1 through 7 at least 10 times every 1-2 hours when you are awake. Take your time and take a few normal breaths between deep breaths. The spirometer may include an indicator to show your best effort. Use the indicator as a goal to work toward during each repetition. After each set of 10 deep breaths, practice coughing to be sure your lungs are clear. If you have an incision (the cut made at the time of surgery), support your incision when coughing by placing a pillow or rolled up towels firmly against it. Once you are able to get out of bed, walk around indoors and cough well. You may stop using the incentive spirometer when instructed by your caregiver.  RISKS AND COMPLICATIONS Take your time so you do not get dizzy or light-headed. If you are in pain, you may need to take or ask for pain medication before doing incentive spirometry. It is harder to take a deep breath if you are having pain. AFTER USE Rest and breathe slowly and easily. It can be helpful to keep track of a log of your progress. Your caregiver can provide you with a simple table to help with this. If you are using the spirometer at home, follow these instructions: SEEK MEDICAL CARE IF:  You are having difficultly using the spirometer. You have trouble using the spirometer as often as instructed. Your pain medication is not giving enough relief while using the spirometer. You develop fever of 100.5 F (38.1 C) or higher. SEEK IMMEDIATE MEDICAL CARE IF:  You cough up bloody sputum that had not been present before. You develop fever of 102 F (38.9 C) or greater. You develop worsening pain at or near the incision site. MAKE SURE YOU:  Understand these instructions. Will watch your condition. Will get help right away if you are not doing well or get worse. Document Released: 08/10/2006 Document Revised: 06/22/2011 Document Reviewed:  10/11/2006 [facility] Patient Information 2014 [place], [place].

## 2023-01-15 NOTE — Progress Notes (Signed)
COVID Vaccine received:  []  No []  Yes Date of any COVID positive Test in last 90 days:  PCP - Cheryll Cockayne MD Cardiologist -   Chest x-ray - 12/05/22 Epic EKG -  12/07/22 Epic Stress Test -  ECHO -  Cardiac Cath -   Bowel Prep - []  No  []   Yes ______  Pacemaker / ICD device []  No []  Yes   Spinal Cord Stimulator:[]  No []  Yes       History of Sleep Apnea? []  No []  Yes   CPAP used?- []  No []  Yes    Does the patient monitor blood sugar?          []  No []  Yes  []  N/A  Patient has: []  NO Hx DM   []  Pre-DM                 []  DM1  []   DM2 Does patient have a [employer] or Dexacom? []  No []  Yes   Fasting Blood Sugar Ranges-  Checks Blood Sugar _____ times a day  GLP1 agonist / usual dose -  GLP1 instructions:  SGLT-2 inhibitors / usual dose -  SGLT-2 instructions:   Blood Thinner / Instructions: Aspirin Instructions:  Comments:   Activity level: Patient is able / unable to climb a flight of stairs without difficulty; []  No CP  []  No SOB, but would have ___   Patient can / can not perform ADLs without assistance.   Anesthesia review:   Patient denies shortness of breath, fever, cough and chest pain at PAT appointment.  Patient verbalized understanding and agreement to the Pre-Surgical Instructions that were given to them at this PAT appointment. Patient was also educated of the need to review these PAT instructions again prior to his/her surgery.I reviewed the appropriate phone numbers to call if they have any and questions or concerns.

## 2023-01-18 ENCOUNTER — Encounter (HOSPITAL_COMMUNITY)
Admission: RE | Admit: 2023-01-18 | Discharge: 2023-01-18 | Disposition: A | Discharge status: Home / Self Care | Payer: 59 | Source: Ambulatory Visit | Attending: Anesthesiology | Admitting: Anesthesiology

## 2023-01-18 DIAGNOSIS — M1711 Unilateral primary osteoarthritis, right knee: Secondary | ICD-10-CM | POA: Diagnosis not present

## 2023-01-18 DIAGNOSIS — E119 Type 2 diabetes mellitus without complications: Secondary | ICD-10-CM

## 2023-01-18 DIAGNOSIS — I10 Essential (primary) hypertension: Secondary | ICD-10-CM

## 2023-01-18 DIAGNOSIS — Z01818 Encounter for other preprocedural examination: Secondary | ICD-10-CM

## 2023-01-18 DIAGNOSIS — Z794 Long term (current) use of insulin: Secondary | ICD-10-CM

## 2023-01-19 ENCOUNTER — Other Ambulatory Visit: Payer: Self-pay | Admitting: Thoracic Surgery (Cardiothoracic Vascular Surgery)

## 2023-01-19 DIAGNOSIS — I7121 Aneurysm of the ascending aorta, without rupture: Secondary | ICD-10-CM

## 2023-01-27 ENCOUNTER — Ambulatory Visit (HOSPITAL_COMMUNITY): Admit: 2023-01-27 | Payer: 59 | Admitting: Orthopedic Surgery

## 2023-01-27 SURGERY — ARTHROPLASTY, KNEE, TOTAL, USING IMAGELESS COMPUTER-ASSISTED NAVIGATION
Anesthesia: Spinal | Site: Knee | Laterality: Right

## 2023-01-29 ENCOUNTER — Other Ambulatory Visit: Payer: Self-pay | Admitting: Physical Medicine and Rehabilitation

## 2023-02-05 DIAGNOSIS — E1165 Type 2 diabetes mellitus with hyperglycemia: Secondary | ICD-10-CM | POA: Diagnosis not present

## 2023-02-25 ENCOUNTER — Encounter: Payer: 59 | Attending: Physical Medicine and Rehabilitation | Admitting: Registered Nurse

## 2023-02-25 ENCOUNTER — Encounter: Payer: Self-pay | Admitting: Registered Nurse

## 2023-02-25 VITALS — BP 123/84 | HR 66 | Ht 66.0 in | Wt 268.0 lb

## 2023-02-25 DIAGNOSIS — M1711 Unilateral primary osteoarthritis, right knee: Secondary | ICD-10-CM | POA: Insufficient documentation

## 2023-02-25 DIAGNOSIS — G894 Chronic pain syndrome: Secondary | ICD-10-CM | POA: Insufficient documentation

## 2023-02-25 DIAGNOSIS — Z5181 Encounter for therapeutic drug level monitoring: Secondary | ICD-10-CM | POA: Insufficient documentation

## 2023-02-25 DIAGNOSIS — M545 Low back pain, unspecified: Secondary | ICD-10-CM | POA: Diagnosis not present

## 2023-02-25 DIAGNOSIS — M2550 Pain in unspecified joint: Secondary | ICD-10-CM | POA: Insufficient documentation

## 2023-02-25 DIAGNOSIS — G8929 Other chronic pain: Secondary | ICD-10-CM | POA: Diagnosis not present

## 2023-02-25 DIAGNOSIS — M1712 Unilateral primary osteoarthritis, left knee: Secondary | ICD-10-CM | POA: Insufficient documentation

## 2023-02-25 DIAGNOSIS — M25512 Pain in left shoulder: Secondary | ICD-10-CM | POA: Diagnosis not present

## 2023-02-25 DIAGNOSIS — Z79891 Long term (current) use of opiate analgesic: Secondary | ICD-10-CM | POA: Diagnosis not present

## 2023-02-25 MED ORDER — OXYCODONE-ACETAMINOPHEN 10-325 MG PO TABS: 1.0000 | ORAL_TABLET | ORAL | 0 refills | Status: AC | PRN

## 2023-02-25 MED ORDER — OXYCODONE-ACETAMINOPHEN 10-325 MG PO TABS: 1.0000 | ORAL_TABLET | ORAL | 0 refills | Status: DC | PRN

## 2023-02-25 NOTE — Progress Notes (Signed)
Subjective:    Patient ID: Tammy Strickland, female    DOB: 1960-04-27, 62 y.o.   MRN: [id number]  HPI: Tammy Strickland is a 62 y.o. female who returns for follow up appointment for chronic pain and medication refill. She states her  pain is located in her left shoulder, lower back pain, bilateral knee pain R>L and generalized joint pain in her bilateral hands. She rates her pain 10. Her current exercise regime is walking and performing stretching exercises.  Tammy Strickland reports her twin sister passed away yesterday, emotional support given.   Tammy Strickland arrived hypertensive, blood pressure was re-checked.   Tammy Strickland Morphine equivalent is 60.00 MME.   Oral Swab was Performed today.     Pain Inventory Average Pain 10 Pain Right Now 10 My pain is sharp, burning, dull, stabbing, tingling, and aching  In the last 24 hours, has pain interfered with the following? General activity 10 Relation with others 0 Enjoyment of life 0 What TIME of day is your pain at its worst? morning , evening, and night Sleep (in general) Poor  Pain is worse with: walking, bending, sitting, standing, and some activites Pain improves with: rest, heat/ice, medication, and injections Relief from Meds: 8  Family History  Problem Relation Age of Onset   Hyperlipidemia Mother    Hypertension Mother    Stroke Mother    Thyroid disease Mother    Heart attack Mother    Hyperlipidemia Father    Hypertension Father    Stroke Father    Heart attack Father    Hypertension Sister    Stroke Sister    Thyroid disease Sister    Breast cancer Sister    Crohn's disease Sister    Hypertension Sister    Hypertension Brother    Diabetes Brother    Hypertension Brother    Social History   Socioeconomic History   Marital status: Single    Spouse name: n/a   Number of children: 3   Years of education: 12+   Highest education level: Not on file  Occupational History   Occupation: disabled-falling, doesn't  recall name of toxin    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  Tobacco Use   Smoking status: Every Day    Current packs/day: 0.50    Average packs/day: 0.5 packs/day for 35.0 years (17.5 ttl pk-yrs)    Types: Cigarettes   Smokeless tobacco: Never   Tobacco comments:    referred  to smoking  cessation  classes at  [employer] Use   Vaping status: Never Used  Substance and Sexual Activity   Alcohol use: Yes    Comment: occasional   Drug use: No   Sexual activity: Not Currently    Partners: Female    Birth control/protection: Surgical    Comment: hysterectomy  Other Topics Concern   Not on file  Social History Narrative   Moved to [place] from [place], [place] February 2017, to help her daughter.   Lives with her daughter.   Sons live in [place] and [place].   She reports that there were originally 17 children in her family (she is the youngest), the oldest are deceased, some prior to her birth, and she isn't sure which were female/female or how they died.   Social Determinants of Health   Financial Resource Strain: Low Risk  (06/15/2022)   Overall Financial Resource Strain (CARDIA)    Difficulty of Paying Living Expenses: Not hard at all  Food Insecurity: No  Food Insecurity (06/15/2022)   Hunger Vital Sign    Worried About Running Out of Food in the Last Year: Never true    Ran Out of Food in the Last Year: Never true  Transportation Needs: No Transportation Needs (06/15/2022)   PRAPARE - Administrator, Civil Service (Medical): No    Lack of Transportation (Non-Medical): No  Physical Activity: Inactive (06/15/2022)   Exercise Vital Sign    Days of Exercise per Week: 0 days    Minutes of Exercise per Session: 0 min  Stress: No Stress Concern Present (06/15/2022)   [employer] of Occupational Health - Occupational Stress Questionnaire    Feeling of Stress : Not at all  Social Connections: Socially Isolated (06/15/2022)   Social Connection and  Isolation Panel [NHANES]    Frequency of Communication with Friends and Family: More than three times a week    Frequency of Social Gatherings with Friends and Family: Once a week    Attends Religious Services: Never    Database administrator or Organizations: No    Attends Engineer, structural: Never    Marital Status: Never married   Past Surgical History:  Procedure Laterality Date   ABDOMINAL HYSTERECTOMY  1989   ovaries left   APPENDECTOMY  1989   BREAST LUMPECTOMY WITH RADIOACTIVE SEED LOCALIZATION Left 06/14/2019   Procedure: LEFT BREAST LUMPECTOMY WITH RADIOACTIVE SEED LOCALIZATION X 2;  Surgeon: Abigail Miyamoto, MD;  Location: [facility];  Service: General;  Laterality: Left;  LMA   CHOLECYSTECTOMY  1989   IR GENERIC HISTORICAL  11/07/2015   IR ANGIO INTRA EXTRACRAN SEL COM CAROTID INNOMINATE BILAT MOD SED 11/07/2015 Julieanne Cotton, MD [facility]   IR GENERIC HISTORICAL  11/07/2015   IR ANGIO VERTEBRAL SEL SUBCLAVIAN INNOMINATE UNI R MOD SED 11/07/2015 Julieanne Cotton, MD [facility]   IR GENERIC HISTORICAL  11/07/2015   IR ANGIO VERTEBRAL SEL VERTEBRAL UNI L MOD SED 11/07/2015 Julieanne Cotton, MD [facility]   IR GENERIC HISTORICAL  11/07/2015   IR ANGIOGRAM EXTREMITY LEFT 11/07/2015 Julieanne Cotton, MD [facility]   MASS EXCISION Left 03/17/2021   Procedure: EXCISION MASS,left middle finger ulnar border mass excision,left thumb volar retinacular ganglion cyst excision;  Surgeon: Gomez Cleverly, MD;  Location: [facility] ;  Service: Orthopedics;  Laterality: Left;   TONSILLECTOMY  2000   adenoids removed   TOTAL KNEE ARTHROPLASTY Left 03/12/2017   Procedure: LEFT TOTAL KNEE ARTHROPLASTY;  Surgeon: Kathryne Hitch, MD;  Location: [facility];  Service: Orthopedics;  Laterality: Left;  Adductor Block   TRIGGER FINGER RELEASE Left 03/17/2021   Procedure: left trigger thumb release;  Surgeon: Gomez Cleverly, MD;  Location: [facility];  Service: Orthopedics;  Laterality: Left;  with local anesthesia   Past Surgical History:  Procedure Laterality Date   ABDOMINAL HYSTERECTOMY  1989   ovaries left   APPENDECTOMY  1989   BREAST LUMPECTOMY WITH RADIOACTIVE SEED LOCALIZATION Left 06/14/2019   Procedure: LEFT BREAST LUMPECTOMY WITH RADIOACTIVE SEED LOCALIZATION X 2;  Surgeon: Abigail Miyamoto, MD;  [facility]: [facility];  Service: General;  Laterality: Left;  LMA   CHOLECYSTECTOMY  1989   IR GENERIC HISTORICAL  11/07/2015   IR ANGIO INTRA EXTRACRAN SEL COM CAROTID INNOMINATE BILAT MOD SED 11/07/2015 Julieanne Cotton, MD [facility]   IR GENERIC HISTORICAL  11/07/2015   IR ANGIO VERTEBRAL SEL SUBCLAVIAN INNOMINATE UNI R MOD SED 11/07/2015 Julieanne Cotton, MD [facility]  IR GENERIC HISTORICAL  11/07/2015   IR ANGIO VERTEBRAL SEL VERTEBRAL UNI L MOD SED 11/07/2015 Julieanne Cotton, MD [facility]   IR GENERIC HISTORICAL  11/07/2015   IR ANGIOGRAM EXTREMITY LEFT 11/07/2015 Julieanne Cotton, MD [facility]   MASS EXCISION Left 03/17/2021   Procedure: EXCISION MASS,left middle finger ulnar border mass excision,left thumb volar retinacular ganglion cyst excision;  Surgeon: Gomez Cleverly, MD;  Location: [facility];  Service: Orthopedics;  Laterality: Left;   TONSILLECTOMY  2000   adenoids removed   TOTAL KNEE ARTHROPLASTY Left 03/12/2017   Procedure: LEFT TOTAL KNEE ARTHROPLASTY;  Surgeon: Kathryne Hitch, MD;  Location: [facility];  Service: Orthopedics;  Laterality: Left;  Adductor Block   TRIGGER FINGER RELEASE Left 03/17/2021   Procedure: left trigger thumb release;  Surgeon: Gomez Cleverly, MD;  Location: [facility];  Service: Orthopedics;  Laterality: Left;  with local anesthesia   Past Medical History:  Diagnosis Date   Ambulates with cane 03/13/2021   Anxiety    Arthritis    Asthma    CHF (congestive heart failure) (HCC)    COPD (chronic obstructive pulmonary disease) (HCC)     Coronary artery disease    Depression    Diabetes mellitus without complication (HCC)    type 2    Fibromyalgia    Gallstones    GERD (gastroesophageal reflux disease)    HLD (hyperlipidemia)    Hypertension    Lupus (HCC)    Migraines    Myocardial infarction (HCC) 2017   Neuromuscular disorder (HCC)    Osteoporosis    Pancreatitis 05/27/2019   none since   Peripheral neuropathy 03/13/2021   all over pt pt   Shingles    Sleep apnea    sleep study 03-03-2017 mild osa no cpap used per pt   Stroke (HCC) 10/2015   no residual from   Uses walker 03/13/2021   Wears glasses    BP (!) 193/88   Pulse 66   Ht 5\' 6"  (1.676 m)   Wt 268 lb (121.6 kg)   SpO2 93%   BMI 43.26 kg/m   Opioid Risk Score:   Fall Risk Score:  `1  Depression screen [facility] 2/9     11/25/2022    9:28 AM 09/24/2022   10:50 AM 08/26/2022    9:29 AM 07/29/2022   10:54 AM 06/30/2022    8:37 AM 06/15/2022    8:55 AM 03/13/2022    9:21 AM  Depression screen PHQ 2/9  Decreased Interest 0 0 0 2 0 0 0  Down, Depressed, Hopeless 0 0 0 0 0 0 0  PHQ - 2 Score 0 0 0 2 0 0 0  Altered sleeping  2  3   1   Tired, decreased energy  2  1   1   Change in appetite  0  1   0  Feeling bad or failure about yourself   0  0   0  Trouble concentrating  0  0   0  Moving slowly or fidgety/restless  0  0   0  Suicidal thoughts  0  0   0  PHQ-9 Score  4  7   2   Difficult doing work/chores  Not difficult at all     Not difficult at all     Review of Systems  Musculoskeletal:        Bilateral knee pain Bilateral foot pain Left shoulder pain Bilateral hand pain  All other systems reviewed and are negative.     Objective:   Physical Exam Vitals and nursing note reviewed.  Constitutional:      Appearance: Normal appearance.  Cardiovascular:     Rate and Rhythm: Normal rate and regular rhythm.     Pulses: Normal pulses.     Heart sounds: Normal heart sounds.  Pulmonary:     Effort: Pulmonary effort is normal.      Breath sounds: Normal breath sounds.  Musculoskeletal:     Comments: Normal Muscle Bulk and Muscle Testing Reveals:  Upper Extremities: Full ROM and Muscle Strength 5/5 Left AC Joint Tenderness Lumbar Paraspinal Tenderness: L-4-L-5 Lower Extremities: Right: Decreased ROM and Muscle Strength 5/5 Right Lower Extremity Flexion Produces Pain into her Right Patella Left Lower Extremity: Full ROM and Muscle Strength 5/5 Arises from Chair slowly using cane for support Antalgic  Gait     Skin:    General: Skin is warm and dry.  Neurological:     Mental Status: She is alert and oriented to person, place, and time.  Psychiatric:        Mood and Affect: Mood normal.        Behavior: Behavior normal.           Assessment & Plan:  Chronic Left Shoulder Pain: Ortho Following. Continue HEP as Tolerated. Continue to Monitor.  Chronic Bilateral Low Back Pain without sciatica: Continue HEP as Tolerated. Continue current medication regimen. Continue to monitor.  Bilateral Knee with Primary Osteoarthritis: Ortho Following. Continue current medication regimen. Continue to Monitor.  Polyarthralgia: Continue HEP as Tolerated. Continue to monitor.  Chronic Pain Syndrome: Refilled: Oxycodone 10/325mg  one tablet every 4 hours as needed for pain #120. Second script sent for the following month. We will continue the opioid monitoring program, this consists of regular clinic visits, examinations, urine drug screen, pill counts as well as use of [place] Controlled Substance Reporting system. A 12 month History has been reviewed on the [place] Controlled Substance Reporting System on 02/25/2023

## 2023-03-03 ENCOUNTER — Ambulatory Visit
Admission: RE | Admit: 2023-03-03 | Discharge: 2023-03-03 | Disposition: A | Discharge status: Home / Self Care | Payer: 59 | Source: Ambulatory Visit | Attending: Thoracic Surgery (Cardiothoracic Vascular Surgery) | Admitting: Thoracic Surgery (Cardiothoracic Vascular Surgery)

## 2023-03-03 DIAGNOSIS — I7121 Aneurysm of the ascending aorta, without rupture: Secondary | ICD-10-CM | POA: Diagnosis not present

## 2023-03-03 LAB — DRUG TOX MONITOR 1 W/CONF, ORAL FLD
Amphetamines: NEGATIVE ng/mL (ref <10)
Barbiturates: NEGATIVE ng/mL (ref <10)
Benzodiazepines: NEGATIVE ng/mL (ref <0.50)
Buprenorphine: NEGATIVE ng/mL (ref <0.10)
Cocaine: NEGATIVE ng/mL (ref <5.0)
Codeine: NEGATIVE ng/mL (ref <2.5)
Cotinine: 25.7 ng/mL — ABNORMAL HIGH (ref <5.0)
Dihydrocodeine: NEGATIVE ng/mL (ref <2.5)
Fentanyl: NEGATIVE ng/mL (ref <0.10)
Heroin Metabolite: NEGATIVE ng/mL (ref <1.0)
Hydrocodone: NEGATIVE ng/mL (ref <2.5)
Hydromorphone: NEGATIVE ng/mL (ref <2.5)
MARIJUANA: NEGATIVE ng/mL (ref <2.5)
MDMA: NEGATIVE ng/mL (ref <10)
Meprobamate: NEGATIVE ng/mL (ref <2.5)
Methadone: NEGATIVE ng/mL (ref <5.0)
Morphine: NEGATIVE ng/mL (ref <2.5)
Nicotine Metabolite: POSITIVE ng/mL — AB (ref <5.0)
Norhydrocodone: NEGATIVE ng/mL (ref <2.5)
Noroxycodone: 5.4 ng/mL — ABNORMAL HIGH (ref <2.5)
Opiates: POSITIVE ng/mL — AB (ref <2.5)
Oxycodone: 80 ng/mL — ABNORMAL HIGH (ref <2.5)
Oxymorphone: NEGATIVE ng/mL (ref <2.5)
Phencyclidine: NEGATIVE ng/mL (ref <10)
Tapentadol: NEGATIVE ng/mL (ref <5.0)
Tramadol: NEGATIVE ng/mL (ref <5.0)
Zolpidem: NEGATIVE ng/mL (ref <5.0)

## 2023-03-03 LAB — DRUG TOX ALC METAB W/CON, ORAL FLD: Alcohol Metabolite: NEGATIVE ng/mL (ref <25)

## 2023-03-03 MED ORDER — IOPAMIDOL (ISOVUE-370) INJECTION 76%
75.0000 mL | Freq: Once | INTRAVENOUS | Status: AC | PRN
  Administered 2023-03-03: 75 mL via INTRAVENOUS

## 2023-03-05 NOTE — Progress Notes (Unsigned)
[address]       [place] [address]             [phone]   PCP is Pincus Sanes, MD Referring Provider is Pincus Sanes, MD  Chief Complaint:Ascending thoracic aortic aneurysm   HPI: This is a 62 year female with a past medical history of tobacco use with COPD and asthma, hypertension, dyslipidemia, fibromyalgia with chronic pain syndrome managed by the pain clinic, and degenerative joint disease-status post left total knee replacement, and diabetes who was incidentally found to have an ascending thoracic aortic aneurysm. She was last seen in November 2023 by my colleague and the ATAA at that time was 4.1 cm. She presents today for further surveillance of her ascending thoracic aortic aneurysm. She denies chest pain, pressure, or tightness.  Past Medical History:  Diagnosis Date   Ambulates with cane 03/13/2021   Anxiety    Arthritis    Asthma    CHF (congestive heart failure) (HCC)    COPD (chronic obstructive pulmonary disease) (HCC)    Coronary artery disease    Depression    Diabetes mellitus without complication (HCC)    type 2    Fibromyalgia    Gallstones    GERD (gastroesophageal reflux disease)    HLD (hyperlipidemia)    Hypertension    Lupus    Migraines    Myocardial infarction (HCC) 2017   Neuromuscular disorder (HCC)    Osteoporosis    Pancreatitis 05/27/2019   none since   Peripheral neuropathy 03/13/2021   all over pt pt   Shingles    Sleep apnea    sleep study 03-03-2017 mild osa no cpap used per pt   Stroke (HCC) 10/2015   no residual from   Uses walker 03/13/2021   Wears glasses     Past Surgical History:  Procedure Laterality Date   ABDOMINAL HYSTERECTOMY  1989   ovaries left   APPENDECTOMY  1989   BREAST LUMPECTOMY WITH RADIOACTIVE SEED LOCALIZATION Left 06/14/2019   Procedure: LEFT BREAST LUMPECTOMY WITH RADIOACTIVE SEED LOCALIZATION X 2;  Surgeon: Abigail Miyamoto, MD;  Location: [facility];  Service: General;   Laterality: Left;  LMA   CHOLECYSTECTOMY  1989   IR GENERIC HISTORICAL  11/07/2015   IR ANGIO INTRA EXTRACRAN SEL COM CAROTID INNOMINATE BILAT MOD SED 11/07/2015 Julieanne Cotton, MD [facility]   IR GENERIC HISTORICAL  11/07/2015   IR ANGIO VERTEBRAL SEL SUBCLAVIAN INNOMINATE UNI R MOD SED 11/07/2015 Julieanne Cotton, MD [facility]   IR GENERIC HISTORICAL  11/07/2015   IR ANGIO VERTEBRAL SEL VERTEBRAL UNI L MOD SED 11/07/2015 Julieanne Cotton, MD [facility]   IR GENERIC HISTORICAL  11/07/2015   IR ANGIOGRAM EXTREMITY LEFT 11/07/2015 Julieanne Cotton, MD [facility]   MASS EXCISION Left 03/17/2021   Procedure: EXCISION MASS,left middle finger ulnar border mass excision,left thumb volar retinacular ganglion cyst excision;  Surgeon: Gomez Cleverly, MD;  Location: [facility];  Service: Orthopedics;  Laterality: Left;   TONSILLECTOMY  2000   adenoids removed   TOTAL KNEE ARTHROPLASTY Left 03/12/2017   Procedure: LEFT TOTAL KNEE ARTHROPLASTY;  Surgeon: Kathryne Hitch, MD;  Location: [facility];  Service: Orthopedics;  Laterality: Left;  Adductor Block   TRIGGER FINGER RELEASE Left 03/17/2021   Procedure: left trigger thumb release;  Surgeon: Gomez Cleverly, MD;  Location: [facility];  Service: Orthopedics;  Laterality: Left;  with local anesthesia  Family History  Problem Relation Age of Onset   Hyperlipidemia Mother    Hypertension Mother    Stroke Mother    Thyroid disease Mother    Heart attack Mother    Hyperlipidemia Father    Hypertension Father    Stroke Father    Heart attack Father    Hypertension Sister    Stroke Sister    Thyroid disease Sister    Breast cancer Sister    Crohn's disease Sister    Hypertension Sister    Hypertension Brother    Diabetes Brother    Hypertension Brother     Social History Social History   Tobacco Use   Smoking status: Every Day    Current packs/day: 0.50    Average packs/day: 0.5  packs/day for 35.0 years (17.5 ttl pk-yrs)    Types: Cigarettes   Smokeless tobacco: Never   Tobacco comments:    referred  to smoking  cessation  classes at  [employer] Use   Vaping status: Never Used  Substance Use Topics   Alcohol use: Yes    Comment: occasional   Drug use: No    Current Outpatient Medications  Medication Sig Dispense Refill   Accu-Chek Softclix Lancets lancets ONE EACH BY OTHER ROUTE FOUR TIMES DAILY. 100 each 11   albuterol (VENTOLIN HFA) 108 (90 Base) MCG/ACT inhaler Inhale 2 puffs into the lungs every 6 (six) hours as needed for wheezing or shortness of breath. 8 g 8   amitriptyline (ELAVIL) 75 MG tablet Take 1 tablet by mouth at bedtime (Patient taking differently: Take 150 mg by mouth at bedtime.) 30 tablet 11   aspirin EC 81 MG tablet Take 1 tablet (81 mg total) by mouth daily.     atorvastatin (LIPITOR) 20 MG tablet Take 1 tablet by mouth every day 30 tablet 11   Cholecalciferol (VITAMIN D3) 50 MCG (2000 UT) capsule Take 1 capsule (2,000 Units total) by mouth daily. 90 capsule 1   cloNIDine (CATAPRES) 0.3 MG tablet Take 1 tablet by mouth 3 times a day 90 tablet 5   Continuous Glucose Sensor (DEXCOM G7 SENSOR) MISC Use 1 sensor for continuous glucose monitoring every 10 days 30 days 3 each 5   fluticasone (FLONASE) 50 MCG/ACT nasal spray Shake liquid & use 1 spray into each nostril every day as needed for allergies 16 g 11   furosemide (LASIX) 80 MG tablet Take 1 tablet by mouth every day 30 tablet 11   glucose blood (ACCU-CHEK GUIDE) test strip TEST TWICE DAILY 200 strip 11   insulin glargine, 2 Unit Dial, (TOUJEO MAX SOLOSTAR) 300 UNIT/ML Solostar Pen Inject 160 Units into the skin every evening. And pen needles 1/day (Patient taking differently: Inject 150 Units into the skin 2 (two) times daily.) 45 mL 3   Insulin Pen Needle (PEN NEEDLES) 32G X 5 MM MISC UAD daily for insulin 90 each 3   KLOR-CON M10 10 MEQ tablet Take 2 tablets by mouth every day 60  tablet 11   losartan (COZAAR) 50 MG tablet Take 1 tablet by mouth twice daily 60 tablet 11   metoprolol (TOPROL-XL) 200 MG 24 hr tablet Take 1 tablet by mouth twice daily 60 tablet 11   naproxen (NAPROSYN) 500 MG tablet Take 1 tablet (500 mg total) by mouth 2 (two) times daily. 20 tablet 0   NARCAN 4 MG/0.1ML LIQD nasal spray kit Place 1 spray into the nose once.     nicotine (NICODERM  CQ - DOSED IN MG/24 HR) 7 mg/24hr patch Place 1 patch (7 mg total) onto the skin daily. 28 patch 0   nitroGLYCERIN (NITROSTAT) 0.4 MG SL tablet Place 0.4 mg under the tongue every 5 (five) minutes as needed for chest pain.     oxyCODONE-acetaminophen (PERCOCET) 10-325 MG tablet Take 1 tablet by mouth every 4 (four) hours as needed for pain. 120 tablet 0   pantoprazole (PROTONIX) 40 MG tablet Take 1 tablet by mouth twice daily 60 tablet 11   sodium chloride (OCEAN) 0.65 % SOLN nasal spray Place 1 spray into both nostrils daily as needed for congestion.     tirzepatide [facility]) 5 MG/0.5ML Pen Inject 5 mg into the skin once a week. 2 mL 5   tiZANidine (ZANAFLEX) 4 MG tablet Take 1 tablet (4 mg total) by mouth 3 (three) times daily. 90 tablet 5   ZTLIDO 1.8 % PTCH Apply 3 patches topically daily. Shoulder, 2 on knees      Allergies  Allergen Reactions   Bacitracin-Polymyxin B Hives   Metformin And Related Other (See Comments)    Lactic acidosis diarrhea hives   Other Other (See Comments)    H/o pancreatitis   Ozempic (0.25 Or 0.5 Mg-Dose) [Semaglutide(0.25 Or 0.5mg -Dos)]     H/o pancreatitis   Quinolones     Avoid due to aortic aneurysm   Bacitracin Hives    Review of Systems  Chest Pain [  ] Resting SOB [ ]  Exertional SOB [  ]  Pedal Edema [  ] Syncope [  ] Presyncope [  ]  General Review of Systems: [Y] = yes [ N]=no  Consitutional:   nausea [ ] ;  fever [ ] ;  Eye : blurred vision [ ] ; Amaurosis fugax[  ];  Resp: cough [ ] ;  hemoptysis[ ] ;  GI: vomiting[ ] ; melena[ ] ; hematochezia [] ;   [id number] ; Musculoskeletal: myalgias[ ] ; joint swelling[  ]; joint erythema[ ] ;  Heme/Lymph: anemia[ ] ;  Neuro: TIA[ ] ;stroke[ ] ;  seizures[ ] ;  Endocrine: diabetes[ ] ;   Vital Signs: Physical Exam: CV Neck Pulmonary Abdomen Extremities Neurologic   Diagnostic Tests:  Narrative & Impression  CLINICAL DATA:  Aortic aneurysm suspected.   EXAM: CT ANGIOGRAPHY CHEST WITH CONTRAST   TECHNIQUE: Multidetector CT imaging of the chest was performed using the standard protocol during bolus administration of intravenous contrast. Multiplanar CT image reconstructions and MIPs were obtained to evaluate the vascular anatomy.   RADIATION DOSE REDUCTION: This exam was performed according to the departmental dose-optimization program which includes automated exposure control, adjustment of the mA and/or kV according to patient size and/or use of iterative reconstruction technique.   CONTRAST:  75mL ISOVUE-370 IOPAMIDOL (ISOVUE-370) INJECTION 76%   COMPARISON:  Chest CT dated 02/12/2022.   FINDINGS: Cardiovascular: There is no cardiomegaly or pericardial effusion. Top-normal ascending aorta measuring up to 4 cm. No aortic dissection. The origins of the great vessels of the aortic arch appear patent. No pulmonary artery embolus identified.   Mediastinum/Nodes: No hilar or mediastinal adenopathy. The esophagus is grossly unremarkable. Asymmetric enlargement of the right thyroid lobe, partially visualized. This has been evaluated on previous imaging. (ref: J Am Coll Radiol. 2015 Feb;12(2): 143-50).No mediastinal fluid collection.   Lungs/Pleura: No focal consolidation, pleural effusion, or pneumothorax. The central airways are patent.   Upper Abdomen: Cholecystectomy.   Musculoskeletal: Osteopenia with degenerative changes. No acute osseous pathology.   Review of the MIP images confirms the above findings.   IMPRESSION: 1. No  acute intrathoracic pathology. No aortic  dissection. 2. Top-normal ascending aorta measuring up to 4 cm. Recommend annual imaging followup by CTA or MRA. This recommendation follows 2010 ACCF/AHA/AATS/ACR/ASA/SCA/SCAI/SIR/STS/SVM Guidelines for the Diagnosis and Management of Patients with Thoracic Aortic Disease. Circulation. 2010; 121: K742-V956. Aortic aneurysm NOS (ICD10-I71.9)     Electronically Signed   By: Elgie Collard M.D.   On: 03/03/2023 17:14     We discussed the results of the latest CTA. Risk Modification in those with ascending thoracic aortic aneurysm:  Continue good control of blood pressure (prefer SBP 130/80 or less)-continue Clonidine, Losartan, and Toprol XL  2. Avoid fluoroquinolone antibiotics (I.e Ciprofloxacin, Avelox, Levofloxacin, Ofloxacin)  3.  Use of statin (to decrease cardiovascular risk)-continue Atorvastatin (Lipitor)  4.  Exercise and activity limitations is individualized, but in general, contact sports are to be  avoided and one should avoid heavy lifting (defined as half of ideal body weight) and exercises involving sustained Valsalva maneuver.  5. Counseling for those suspected of having genetically mediated disease. First-degree relatives of those with TAA disease should be screened as well as those who have a connective tissue disease (I.e with Marfan syndrome, Ehlers-Danlos syndrome,  and Loeys-Dietz syndrome) or a  bicuspid aortic valve,have an increased risk for  complications related to TAA.An echocardiogram obtained about 5 years ago shows a Tri leaflet aortic valve with no structural or functional abnormalities.   6. She has tobacco abuse, smoking cessation is highly encouraged.   Impression and Plan: CTA with a 4 cm ascending aortic aneurysm.  Echocardiogram done in 2017 showed a tri leaflet valve without evidence of regurgitation.  We discussed the natural history and and risk factors for growth of ascending aortic aneurysms.  We covered the importance of smoking  cessation, tight blood pressure control, refraining from lifting heavy objects, and avoiding fluoroquinolones.  The patient is aware of signs and symptoms of aortic dissection and when to present to the emergency department.  We will continue surveillance and a repeat CTA was ordered for 1 year.     Ardelle Balls, PA-C [facility] [phone]

## 2023-03-08 DIAGNOSIS — E1165 Type 2 diabetes mellitus with hyperglycemia: Secondary | ICD-10-CM | POA: Diagnosis not present

## 2023-03-10 ENCOUNTER — Ambulatory Visit: Payer: 59 | Admitting: Physician Assistant

## 2023-03-10 ENCOUNTER — Encounter: Payer: Self-pay | Admitting: Physician Assistant

## 2023-03-10 VITALS — BP 150/87 | HR 75 | Resp 18 | Ht 66.0 in | Wt 264.0 lb

## 2023-03-10 DIAGNOSIS — I7121 Aneurysm of the ascending aorta, without rupture: Secondary | ICD-10-CM | POA: Diagnosis not present

## 2023-03-10 NOTE — Patient Instructions (Addendum)
Risk Modification in those with ascending thoracic aortic aneurysm:  Continue good control of blood pressure (prefer SBP 130/80 or less)-continue Clonidine, Losartan, and Toprol XL  2. Avoid fluoroquinolone antibiotics (I.e Ciprofloxacin, Avelox, Levofloxacin, Ofloxacin)  3.  Use of statin (to decrease cardiovascular risk)-continue Atorvastatin (Lipitor)  4.  Exercise and activity limitations is individualized, but in general, contact sports are to be avoided and one should avoid heavy lifting (defined as half of ideal body weight) and exercises involving sustained Valsalva maneuver.  5. Counseling for those suspected of having genetically mediated disease. First-degree relatives of those with TAA disease should be screened as well as those who have a connective tissue disease (I.e with Marfan syndrome, Ehlers-Danlos syndrome,  and Loeys-Dietz syndrome) or a  bicuspid aortic valve,have an increased risk for complications related to TAA. She does not have a family history of connective tissue disease. 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   6. She has tobacco abuse, smoking cessation is highly encouraged.

## 2023-03-30 ENCOUNTER — Other Ambulatory Visit: Payer: Self-pay | Admitting: Internal Medicine

## 2023-03-30 DIAGNOSIS — I10 Essential (primary) hypertension: Secondary | ICD-10-CM

## 2023-04-05 DIAGNOSIS — M1711 Unilateral primary osteoarthritis, right knee: Secondary | ICD-10-CM | POA: Diagnosis not present

## 2023-04-05 DIAGNOSIS — Z96652 Presence of left artificial knee joint: Secondary | ICD-10-CM | POA: Diagnosis not present

## 2023-04-06 ENCOUNTER — Telehealth: Payer: Self-pay | Admitting: Internal Medicine

## 2023-04-06 NOTE — Telephone Encounter (Signed)
We received a fax for a preoperative clearance form to be completed by patient's PCP. They would like the form, the last office visit note, and any pertinent records faxed to [phone].

## 2023-04-07 DIAGNOSIS — E1165 Type 2 diabetes mellitus with hyperglycemia: Secondary | ICD-10-CM | POA: Diagnosis not present

## 2023-04-09 DIAGNOSIS — M25512 Pain in left shoulder: Secondary | ICD-10-CM | POA: Diagnosis not present

## 2023-04-10 ENCOUNTER — Other Ambulatory Visit: Payer: Self-pay | Admitting: Internal Medicine

## 2023-04-12 NOTE — Telephone Encounter (Signed)
Should not be due for refill until Feb but is not controlled. Okay for 30 no refills if needed

## 2023-04-13 NOTE — Telephone Encounter (Signed)
Clearance has been faxed quite a few times.  Resent clearance again that was faxed back on 12/25/22 clearing her for surgery.

## 2023-04-13 NOTE — Telephone Encounter (Signed)
2nd conformation received from today's clearance fax.

## 2023-04-22 ENCOUNTER — Encounter: Payer: Self-pay | Admitting: Internal Medicine

## 2023-04-22 NOTE — Patient Instructions (Addendum)
    Steroid injection given.  Flu vaccine given.    Blood work was ordered.   Chest xray ordered.      Medications changes include :   2 antibiotics, cough syrups, albuterol      Return in about 4 months (around 08/21/2023) for Physical Exam

## 2023-04-22 NOTE — Progress Notes (Signed)
 Subjective:    Patient ID: Tammy Strickland, female    DOB: [birth year], 62 y.o.   MRN: [id number]     HPI Briseis is here for follow up of her chronic medical problems.   Sick for 2 weeks. Doing Symbicort , albuterol  neb treatments.  Symptoms started two weeks ago - worse this week.  The past couple of days she is not eating.  She has been drinking plenty of fluids.  She feels like she had pneumonia which she has had in the past.  She thought about going to the hospital.  Incontinence.  With all of the coughing and sneezing from her cold she has had significant incontinence and needs to get a order of depends and incontinence pads from [employer], which she has had in the past.  She needs depends-size medium and incontinence pads.  Medications and allergies reviewed with patient and updated if appropriate.  Current Outpatient Medications on File Prior to Visit  Medication Sig Dispense Refill   Accu-Chek Softclix Lancets lancets ONE EACH BY OTHER ROUTE FOUR TIMES DAILY. 100 each 11   albuterol  (VENTOLIN  HFA) 108 (90 Base) MCG/ACT inhaler Inhale 2 puffs into the lungs every 6 (six) hours as needed for wheezing or shortness of breath. 8 g 8   amitriptyline  (ELAVIL ) 75 MG tablet Take 1 tablet by mouth at bedtime 30 tablet 0   aspirin  EC 81 MG tablet Take 1 tablet (81 mg total) by mouth daily.     atorvastatin  (LIPITOR) 20 MG tablet Take 1 tablet by mouth every day 30 tablet 11   Cholecalciferol (VITAMIN D3) 50 MCG (2000 UT) capsule Take 1 capsule (2,000 Units total) by mouth daily. 90 capsule 1   cloNIDine  (CATAPRES ) 0.3 MG tablet Take 1 tablet by mouth 3 times a day 90 tablet 11   Continuous Glucose Sensor (DEXCOM G7 SENSOR) MISC Use 1 sensor for continuous glucose monitoring every 10 days 30 days 3 each 5   fluticasone  (FLONASE ) 50 MCG/ACT nasal spray Shake liquid & use 1 spray into each nostril every day as needed for allergies 16 g 11   furosemide  (LASIX ) 80 MG tablet Take 1 tablet by  mouth every day 30 tablet 11   glucose blood (ACCU-CHEK GUIDE) test strip TEST TWICE DAILY 200 strip 11   insulin  glargine, 2 Unit Dial, (TOUJEO  MAX SOLOSTAR) 300 UNIT/ML Solostar Pen Inject 160 Units into the skin every evening. And pen needles 1/day (Patient taking differently: Inject 150 Units into the skin 2 (two) times daily.) 45 mL 3   Insulin  Pen Needle (PEN NEEDLES) 32G X 5 MM MISC UAD daily for insulin  90 each 3   KLOR-CON  M10 10 MEQ tablet Take 2 tablets by mouth every day 60 tablet 11   losartan  (COZAAR ) 50 MG tablet Take 1 tablet by mouth twice daily 60 tablet 11   metoprolol  (TOPROL -XL) 200 MG 24 hr tablet Take 1 tablet by mouth twice daily 60 tablet 11   NARCAN 4 MG/0.1ML LIQD nasal spray kit Place 1 spray into the nose once.     nitroGLYCERIN  (NITROSTAT ) 0.4 MG SL tablet Place 0.4 mg under the tongue every 5 (five) minutes as needed for chest pain.     pantoprazole  (PROTONIX ) 40 MG tablet Take 1 tablet by mouth twice daily 60 tablet 11   sodium chloride  (OCEAN) 0.65 % SOLN nasal spray Place 1 spray into both nostrils daily as needed for congestion.     tirzepatide  (MOUNJARO ) 5 MG/0.5ML Pen Inject  5 mg into the skin once a week. 2 mL 5   tiZANidine  (ZANAFLEX ) 4 MG tablet Take 1 tablet (4 mg total) by mouth 3 (three) times daily. 90 tablet 5   ZTLIDO  1.8 % PTCH Apply 3 patches topically daily. Shoulder, 2 on knees     No current facility-administered medications on file prior to visit.     Review of Systems  Constitutional:  Positive for appetite change and diaphoresis. Negative for fever.  HENT:  Positive for congestion, ear pain (ear clogged), hearing loss and postnasal drip. Negative for sinus pain and sore throat.   Respiratory:  Positive for cough (productive - thick clear- yellow phlegm), chest tightness, shortness of breath and wheezing.   Cardiovascular:  Positive for chest pain (from coughing), palpitations and leg swelling.  Neurological:  Positive for dizziness,  light-headedness and headaches.       Objective:   Vitals:   04/23/23 0924  BP: (!) 148/92  Pulse: 66  Temp: 98.6 F (37 C)  SpO2: 99%   BP Readings from Last 3 Encounters:  04/23/23 (!) 148/92  03/10/23 (!) 150/87  02/25/23 123/84   Wt Readings from Last 3 Encounters:  04/23/23 247 lb (112 kg)  03/10/23 264 lb (119.7 kg)  02/25/23 268 lb (121.6 kg)   Body mass index is 39.87 kg/m.    Physical Exam Constitutional:      General: She is not in acute distress.    Appearance: Normal appearance.  HENT:     Head: Normocephalic and atraumatic.  Eyes:     Conjunctiva/sclera: Conjunctivae normal.  Cardiovascular:     Rate and Rhythm: Normal rate and regular rhythm.     Heart sounds: Normal heart sounds.  Pulmonary:     Effort: Pulmonary effort is normal. No respiratory distress.     Breath sounds: Wheezing present. No rales.  Musculoskeletal:     Cervical back: Neck supple.     Right lower leg: No edema.     Left lower leg: No edema.  Lymphadenopathy:     Cervical: No cervical adenopathy.  Skin:    General: Skin is warm and dry.     Findings: No rash.  Neurological:     Mental Status: She is alert. Mental status is at baseline.  Psychiatric:        Mood and Affect: Mood normal.        Behavior: Behavior normal.        Lab Results  Component Value Date   WBC 8.2 12/05/2022   HGB 10.5 (L) 12/05/2022   HCT 34.4 (L) 12/05/2022   PLT 338 12/05/2022   GLUCOSE 95 12/05/2022   CHOL 149 06/02/2022   TRIG 112.0 06/02/2022   HDL 65.20 06/02/2022   LDLDIRECT 101.0 09/19/2019   LDLCALC 61 06/02/2022   ALT 17 06/02/2022   AST 17 06/02/2022   NA 139 12/05/2022   K 4.0 12/05/2022   CL 99 12/05/2022   CREATININE 0.72 12/05/2022   BUN 6 12/05/2022   CO2 26 12/05/2022   TSH 5.02 01/07/2021   INR 1.06 11/07/2015   HGBA1C 7.1 (H) 12/25/2022   MICROALBUR 2.5 (H) 06/02/2022     Assessment & Plan:    See Problem List for Assessment and Plan of chronic medical  problems.

## 2023-04-23 ENCOUNTER — Ambulatory Visit (INDEPENDENT_AMBULATORY_CARE_PROVIDER_SITE_OTHER): Payer: 59

## 2023-04-23 ENCOUNTER — Ambulatory Visit (INDEPENDENT_AMBULATORY_CARE_PROVIDER_SITE_OTHER): Payer: 59 | Admitting: Internal Medicine

## 2023-04-23 VITALS — BP 148/92 | HR 66 | Temp 98.6°F | Ht 66.0 in | Wt 247.0 lb

## 2023-04-23 DIAGNOSIS — I5032 Chronic diastolic (congestive) heart failure: Secondary | ICD-10-CM | POA: Diagnosis not present

## 2023-04-23 DIAGNOSIS — K219 Gastro-esophageal reflux disease without esophagitis: Secondary | ICD-10-CM

## 2023-04-23 DIAGNOSIS — F3289 Other specified depressive episodes: Secondary | ICD-10-CM | POA: Diagnosis not present

## 2023-04-23 DIAGNOSIS — R059 Cough, unspecified: Secondary | ICD-10-CM | POA: Diagnosis not present

## 2023-04-23 DIAGNOSIS — G4700 Insomnia, unspecified: Secondary | ICD-10-CM | POA: Diagnosis not present

## 2023-04-23 DIAGNOSIS — Z23 Encounter for immunization: Secondary | ICD-10-CM

## 2023-04-23 DIAGNOSIS — J449 Chronic obstructive pulmonary disease, unspecified: Secondary | ICD-10-CM

## 2023-04-23 DIAGNOSIS — I10 Essential (primary) hypertension: Secondary | ICD-10-CM

## 2023-04-23 DIAGNOSIS — N3946 Mixed incontinence: Secondary | ICD-10-CM

## 2023-04-23 DIAGNOSIS — E1142 Type 2 diabetes mellitus with diabetic polyneuropathy: Secondary | ICD-10-CM

## 2023-04-23 DIAGNOSIS — R062 Wheezing: Secondary | ICD-10-CM | POA: Diagnosis not present

## 2023-04-23 DIAGNOSIS — R32 Unspecified urinary incontinence: Secondary | ICD-10-CM | POA: Insufficient documentation

## 2023-04-23 DIAGNOSIS — I2510 Atherosclerotic heart disease of native coronary artery without angina pectoris: Secondary | ICD-10-CM

## 2023-04-23 DIAGNOSIS — E782 Mixed hyperlipidemia: Secondary | ICD-10-CM

## 2023-04-23 DIAGNOSIS — Z794 Long term (current) use of insulin: Secondary | ICD-10-CM | POA: Diagnosis not present

## 2023-04-23 DIAGNOSIS — J189 Pneumonia, unspecified organism: Secondary | ICD-10-CM | POA: Diagnosis not present

## 2023-04-23 LAB — LIPID PANEL
Cholesterol: 159 mg/dL (ref 0–200)
HDL: 76.5 mg/dL (ref >39.00)
LDL Cholesterol: 67 mg/dL (ref 0–99)
NonHDL: 82.8
Total CHOL/HDL Ratio: 2
Triglycerides: 77 mg/dL (ref 0.0–149.0)
VLDL: 15.4 mg/dL (ref 0.0–40.0)

## 2023-04-23 LAB — CBC WITH DIFFERENTIAL/PLATELET
Basophils Absolute: 0.2 10*3/uL — ABNORMAL HIGH (ref 0.0–0.1)
Basophils Relative: 2 % (ref 0.0–3.0)
Eosinophils Absolute: 0.3 10*3/uL (ref 0.0–0.7)
Eosinophils Relative: 2.8 % (ref 0.0–5.0)
HCT: 36.7 % (ref 36.0–46.0)
Hemoglobin: 11.2 g/dL — ABNORMAL LOW (ref 12.0–15.0)
Lymphocytes Relative: 33.4 % (ref 12.0–46.0)
Lymphs Abs: 4 10*3/uL (ref 0.7–4.0)
MCHC: 30.4 g/dL (ref 30.0–36.0)
MCV: 74.2 fL — ABNORMAL LOW (ref 78.0–100.0)
Monocytes Absolute: 1.5 10*3/uL — ABNORMAL HIGH (ref 0.1–1.0)
Monocytes Relative: 12.9 % — ABNORMAL HIGH (ref 3.0–12.0)
Neutro Abs: 5.9 10*3/uL (ref 1.4–7.7)
Neutrophils Relative %: 48.9 % (ref 43.0–77.0)
Platelets: 384 10*3/uL (ref 150.0–400.0)
RBC: 4.95 Mil/uL (ref 3.87–5.11)
RDW: 20.1 % — ABNORMAL HIGH (ref 11.5–15.5)
WBC: 12 10*3/uL — ABNORMAL HIGH (ref 4.0–10.5)

## 2023-04-23 LAB — MICROALBUMIN / CREATININE URINE RATIO
Creatinine,U: 163.3 mg/dL
Microalb Creat Ratio: 2.9 mg/g (ref 0.0–30.0)
Microalb, Ur: 4.7 mg/dL — ABNORMAL HIGH (ref 0.0–1.9)

## 2023-04-23 LAB — COMPREHENSIVE METABOLIC PANEL
ALT: 26 U/L (ref 0–35)
AST: 34 U/L (ref 0–37)
Albumin: 4.2 g/dL (ref 3.5–5.2)
Alkaline Phosphatase: 121 U/L — ABNORMAL HIGH (ref 39–117)
BUN: 11 mg/dL (ref 6–23)
CO2: 31 meq/L (ref 19–32)
Calcium: 9.5 mg/dL (ref 8.4–10.5)
Chloride: 100 meq/L (ref 96–112)
Creatinine, Ser: 0.72 mg/dL (ref 0.40–1.20)
GFR: 90.47 mL/min (ref >60.00)
Glucose, Bld: 105 mg/dL — ABNORMAL HIGH (ref 70–99)
Potassium: 4.1 meq/L (ref 3.5–5.1)
Sodium: 139 meq/L (ref 135–145)
Total Bilirubin: 0.3 mg/dL (ref 0.2–1.2)
Total Protein: 7.9 g/dL (ref 6.0–8.3)

## 2023-04-23 LAB — HEMOGLOBIN A1C: Hgb A1c MFr Bld: 7.3 % — ABNORMAL HIGH (ref 4.6–6.5)

## 2023-04-23 MED ORDER — METHYLPREDNISOLONE ACETATE 80 MG/ML IJ SUSP
80.0000 mg | Freq: Once | INTRAMUSCULAR | Status: AC
Start: 2023-04-23 — End: 2023-04-23
  Administered 2023-04-23: 80 mg via INTRAMUSCULAR

## 2023-04-23 MED ORDER — BLADDER CONTROL PADS EX ABSORB MISC
Status: DC
Start: 2023-04-23 — End: 2023-04-27

## 2023-04-23 MED ORDER — CEFDINIR 300 MG PO CAPS: 300.0000 mg | ORAL_CAPSULE | Freq: Two times a day (BID) | ORAL | 0 refills | Status: DC

## 2023-04-23 MED ORDER — ALBUTEROL SULFATE (2.5 MG/3ML) 0.083% IN NEBU: 2.5000 mg | INHALATION_SOLUTION | Freq: Four times a day (QID) | RESPIRATORY_TRACT | 1 refills | Status: AC | PRN

## 2023-04-23 MED ORDER — AZITHROMYCIN 250 MG PO TABS: ORAL_TABLET | ORAL | 0 refills | Status: DC

## 2023-04-23 MED ORDER — HYDROCOD POLI-CHLORPHE POLI ER 10-8 MG/5ML PO SUER: 5.0000 mL | Freq: Two times a day (BID) | ORAL | 0 refills | Status: DC | PRN

## 2023-04-23 MED ORDER — DEPEND FIT-FLEX-WOMEN-M MISC: Status: AC

## 2023-04-23 NOTE — Assessment & Plan Note (Addendum)
 Chronic Controlled at baseline Having increased symptoms because of cold Continue Symbicort twice daily and albuterol nebulizer every 4-6 hours as needed

## 2023-04-23 NOTE — Assessment & Plan Note (Signed)
 Here for clearance for right total knee arthroplasty Chronic medical problems stable Hypertension controlled Sugars much improved over the past year A1c today No concerning symptoms suggestive of angina CAD, COPD controlled  EKG from August 2024: Sinus rhythm at bpm, low voltage, otherwise normal   Low risk for low risk surgery - cleared for surgery.

## 2023-04-23 NOTE — Assessment & Plan Note (Signed)
Chronic Continue amitriptyline 75 mg nightly Continue tizanidine 4 mg nightly

## 2023-04-23 NOTE — Assessment & Plan Note (Signed)
Chronic  Controlled Continue amitriptyline 75 mg at bedtime

## 2023-04-23 NOTE — Assessment & Plan Note (Signed)
 Chronic Worse recently with cold-has had many accidents Likely combination of stress incontinence and urge incontinence Has used incontinence supplies in the past and requested some Depends medium Bladder pads Agree both are medically necessary and will help her get the supplies needed

## 2023-04-23 NOTE — Assessment & Plan Note (Signed)
Chronic GERD controlled Continue pantoprazole 40 mg twice daily 

## 2023-04-23 NOTE — Assessment & Plan Note (Signed)
 Chronic Appears to be euvolemic Continue lasix 80 mg daly

## 2023-04-23 NOTE — Assessment & Plan Note (Addendum)
 Chronic Blood pressure elevated here today, but she is quite sick and did not take her medications today Stressed good control of her blood pressure-encouraged her to monitor at home CBC, CMP Continue losartan  50 mg twice daily, metoprolol  XL 200 mg twice daily, clonidine  0.3 mg 3 times daily

## 2023-04-23 NOTE — Assessment & Plan Note (Signed)
Chronic °Regular exercise and healthy diet encouraged °Check lipid panel  °Continue atorvastatin 20 mg daily °

## 2023-04-23 NOTE — Assessment & Plan Note (Signed)
 Acute Has been sick for 2 weeks-symptoms worsening this past week Clinically concern for community-acquired pneumonia Will get chest x-ray Start Z-Pak, Omnicef  twice daily x 10 days Tussidex cough syrup She has wheezing on exam so we will give her Depo-Medrol  80 mg IM x 1 here today Continue Symbicort  2 puffs twice daily, albuterol  nebulizer every 4-6 hours Rest, fluids She will call if her symptoms are not improving

## 2023-04-23 NOTE — Assessment & Plan Note (Addendum)
 Chronic No symptoms consistent with ischemia Continue aspirin 81 mg daily, atorvastatin and metoprolol

## 2023-04-23 NOTE — Assessment & Plan Note (Addendum)
 Chronic Following with endocrine Lab Results  Component Value Date   HGBA1C 7.1 (H) 12/25/2022   A1c today, urine microalbumin Continue Mounjaro to 5 mg weekly On toujeo 150 units twice daily Stressed diabetic diet and eating regularly

## 2023-04-26 ENCOUNTER — Ambulatory Visit: Payer: Self-pay | Admitting: Internal Medicine

## 2023-04-26 ENCOUNTER — Telehealth: Payer: Self-pay

## 2023-04-26 DIAGNOSIS — I10 Essential (primary) hypertension: Secondary | ICD-10-CM | POA: Diagnosis not present

## 2023-04-26 DIAGNOSIS — N3946 Mixed incontinence: Secondary | ICD-10-CM

## 2023-04-26 DIAGNOSIS — E782 Mixed hyperlipidemia: Secondary | ICD-10-CM | POA: Diagnosis not present

## 2023-04-26 DIAGNOSIS — E1142 Type 2 diabetes mellitus with diabetic polyneuropathy: Secondary | ICD-10-CM | POA: Diagnosis not present

## 2023-04-26 DIAGNOSIS — E1165 Type 2 diabetes mellitus with hyperglycemia: Secondary | ICD-10-CM | POA: Diagnosis not present

## 2023-04-26 NOTE — Telephone Encounter (Signed)
 2nd attempt, LMOM for pt to return call to office.

## 2023-04-26 NOTE — Progress Notes (Signed)
 Subjective:    Patient ID: Tammy Strickland, female    DOB: 07/28/1960, 63 y.o.   MRN: [id number]  HPI: Tammy Strickland is a 63 y.o. female who returns for follow up appointment for chronic pain and medication refill. She states her pain is located in  her . Left shoulder, lower back and bilateral knee pain L>R. She also reports generalized joint pain. She rates her  pain 9. Her current exercise regime is walking and performing stretching exercises.  Tammy Strickland reports she was walking down her porch steps, she landed on her left side. Her son and transport driver helped her up. She didn't seek medical attention. Educated on Falls prevention, she verbalizes understanding.   Tammy Strickland Morphine  equivalent is 60.00 MME.   Last Oral Swab was Performed on 02/25/2023, it was consistent.      Pain Inventory Average Pain 10 Pain Right Now 9 My pain is constant, sharp, burning, dull, stabbing, tingling, and aching  In the last 24 hours, has pain interfered with the following? General activity 10 Relation with others 10 Enjoyment of life 10 What TIME of day is your pain at its worst? morning , daytime, evening, and night Sleep (in general) Poor  Pain is worse with: walking, bending, sitting, standing, and some activites Pain improves with: rest, medication, TENS, injections, and heat Relief from Meds:   Family History  Problem Relation Age of Onset   Hyperlipidemia Mother    Hypertension Mother    Stroke Mother    Thyroid  disease Mother    Heart attack Mother    Hyperlipidemia Father    Hypertension Father    Stroke Father    Heart attack Father    Hypertension Sister    Stroke Sister    Thyroid  disease Sister    Breast cancer Sister    Crohn's disease Sister    Hypertension Sister    Hypertension Brother    Diabetes Brother    Hypertension Brother    Social History   Socioeconomic History   Marital status: Single    Spouse name: n/a   Number of children: 3   Years of  education: 12+   Highest education level: Not on file  Occupational History   Occupation: disabled-falling, doesn't recall name of toxin    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  Tobacco Use   Smoking status: Every Day    Current packs/day: 0.50    Average packs/day: 0.5 packs/day for 35.0 years (17.5 ttl pk-yrs)    Types: Cigarettes   Smokeless tobacco: Never   Tobacco comments:    referred  to smoking  cessation  classes at  [place] Use   Vaping status: Never Used  Substance and Sexual Activity   Alcohol use: Yes    Comment: occasional   Drug use: No   Sexual activity: Not Currently    Partners: Female    Birth control/protection: Surgical    Comment: hysterectomy  Other Topics Concern   Not on file  Social History Narrative   Moved to [place] from [place], [place] February 2017, to help her daughter.   Lives with her daughter.   Sons live in [place] and [place].   She reports that there were originally 17 children in her family (she is the youngest), the oldest are deceased, some prior to her birth, and she isn't sure which were female/female or how they died.   Social Drivers of Health   Financial Resource Strain: Low Risk  (06/15/2022)  Overall Financial Resource Strain (CARDIA)    Difficulty of Paying Living Expenses: Not hard at all  Food Insecurity: No Food Insecurity (06/15/2022)   Hunger Vital Sign    Worried About Running Out of Food in the Last Year: Never true    Ran Out of Food in the Last Year: Never true  Transportation Needs: No Transportation Needs (06/15/2022)   PRAPARE - Administrator, Civil Service (Medical): No    Lack of Transportation (Non-Medical): No  Physical Activity: Inactive (06/15/2022)   Exercise Vital Sign    Days of Exercise per Week: 0 days    Minutes of Exercise per Session: 0 min  Stress: No Stress Concern Present (06/15/2022)   [employer] of Occupational Health - Occupational Stress Questionnaire     Feeling of Stress : Not at all  Social Connections: Socially Isolated (06/15/2022)   Social Connection and Isolation Panel [NHANES]    Frequency of Communication with Friends and Family: More than three times a week    Frequency of Social Gatherings with Friends and Family: Once a week    Attends Religious Services: Never    Database Administrator or Organizations: No    Attends Engineer, Structural: Never    Marital Status: Never married   Past Surgical History:  Procedure Laterality Date   ABDOMINAL HYSTERECTOMY  1989   ovaries left   APPENDECTOMY  1989   BREAST LUMPECTOMY WITH RADIOACTIVE SEED LOCALIZATION Left 06/14/2019   Procedure: LEFT BREAST LUMPECTOMY WITH RADIOACTIVE SEED LOCALIZATION X 2;  Surgeon: Vernetta Berg, MD;  Location: [facility];  Service: General;  Laterality: Left;  LMA   CHOLECYSTECTOMY  1989   IR GENERIC HISTORICAL  11/07/2015   IR ANGIO INTRA EXTRACRAN SEL COM CAROTID INNOMINATE BILAT MOD SED 11/07/2015 Thyra Nash, MD [facility]   IR GENERIC HISTORICAL  11/07/2015   IR ANGIO VERTEBRAL SEL SUBCLAVIAN INNOMINATE UNI R MOD SED 11/07/2015 Thyra Nash, MD [facility]   IR GENERIC HISTORICAL  11/07/2015   IR ANGIO VERTEBRAL SEL VERTEBRAL UNI L MOD SED 11/07/2015 Thyra Nash, MD [facility]   IR GENERIC HISTORICAL  11/07/2015   IR ANGIOGRAM EXTREMITY LEFT 11/07/2015 Thyra Nash, MD [facility]   MASS EXCISION Left 03/17/2021   Procedure: EXCISION MASS,left middle finger ulnar border mass excision,left thumb volar retinacular ganglion cyst excision;  Surgeon: Alyse Agent, MD;  Location: [facility];  Service: Orthopedics;  Laterality: Left;   TONSILLECTOMY  2000   adenoids removed   TOTAL KNEE ARTHROPLASTY Left 03/12/2017   Procedure: LEFT TOTAL KNEE ARTHROPLASTY;  Surgeon: Vernetta Lonni GRADE, MD;  Location: [facility];  Service: Orthopedics;  Laterality: Left;  Adductor Block   TRIGGER FINGER RELEASE Left  03/17/2021   Procedure: left trigger thumb release;  Surgeon: Alyse Agent, MD;  Location: [facility];  Service: Orthopedics;  Laterality: Left;  with local anesthesia   Past Surgical History:  Procedure Laterality Date   ABDOMINAL HYSTERECTOMY  1989   ovaries left   APPENDECTOMY  1989   BREAST LUMPECTOMY WITH RADIOACTIVE SEED LOCALIZATION Left 06/14/2019   Procedure: LEFT BREAST LUMPECTOMY WITH RADIOACTIVE SEED LOCALIZATION X 2;  Surgeon: Vernetta Berg, MD;  Location: [facility];  Service: General;  Laterality: Left;  LMA   CHOLECYSTECTOMY  1989   IR GENERIC HISTORICAL  11/07/2015   IR ANGIO INTRA EXTRACRAN SEL COM CAROTID INNOMINATE BILAT MOD SED 11/07/2015 Thyra Nash, MD [facility]   IR GENERIC HISTORICAL  11/07/2015   IR ANGIO VERTEBRAL SEL SUBCLAVIAN INNOMINATE UNI R MOD SED 11/07/2015 Thyra Nash, MD [facility]   IR GENERIC HISTORICAL  11/07/2015   IR ANGIO VERTEBRAL SEL VERTEBRAL UNI L MOD SED 11/07/2015 Thyra Nash, MD [facility]   IR GENERIC HISTORICAL  11/07/2015   IR ANGIOGRAM EXTREMITY LEFT 11/07/2015 Thyra Nash, MD [facility]   MASS EXCISION Left 03/17/2021   Procedure: EXCISION MASS,left middle finger ulnar border mass excision,left thumb volar retinacular ganglion cyst excision;  Surgeon: Alyse Agent, MD;  Location: [facility];  Service: Orthopedics;  Laterality: Left;   TONSILLECTOMY  2000   adenoids removed   TOTAL KNEE ARTHROPLASTY Left 03/12/2017   Procedure: LEFT TOTAL KNEE ARTHROPLASTY;  Surgeon: Vernetta Lonni GRADE, MD;  Location: [facility];  Service: Orthopedics;  Laterality: Left;  Adductor Block   TRIGGER FINGER RELEASE Left 03/17/2021   Procedure: left trigger thumb release;  Surgeon: Alyse Agent, MD;  Location: [facility];  Service: Orthopedics;  Laterality: Left;  with local anesthesia   Past Medical History:  Diagnosis Date   Ambulates with cane 03/13/2021   Anxiety     Arthritis    Asthma    CHF (congestive heart failure) (HCC)    COPD (chronic obstructive pulmonary disease) (HCC)    Coronary artery disease    Depression    Diabetes mellitus without complication (HCC)    type 2    Fibromyalgia    Gallstones    GERD (gastroesophageal reflux disease)    HLD (hyperlipidemia)    Hypertension    Lupus    Migraines    Myocardial infarction (HCC) 2017   Neuromuscular disorder (HCC)    Osteoporosis    Pancreatitis 05/27/2019   none since   Peripheral neuropathy 03/13/2021   all over pt pt   Shingles    Sleep apnea    sleep study 03-03-2017 mild osa no cpap used per pt   Stroke (HCC) 10/2015   no residual from   Uses walker 03/13/2021   Wears glasses    There were no vitals taken for this visit.  Opioid Risk Score:   Fall Risk Score:  `1  Depression screen [facility] 2/9     11/25/2022    9:28 AM 09/24/2022   10:50 AM 08/26/2022    9:29 AM 07/29/2022   10:54 AM 06/30/2022    8:37 AM 06/15/2022    8:55 AM 03/13/2022    9:21 AM  Depression screen PHQ 2/9  Decreased Interest 0 0 0 2 0 0 0  Down, Depressed, Hopeless 0 0 0 0 0 0 0  PHQ - 2 Score 0 0 0 2 0 0 0  Altered sleeping  2  3   1   Tired, decreased energy  2  1   1   Change in appetite  0  1   0  Feeling bad or failure about yourself   0  0   0  Trouble concentrating  0  0   0  Moving slowly or fidgety/restless  0  0   0  Suicidal thoughts  0  0   0  PHQ-9 Score  4  7   2   Difficult doing work/chores  Not difficult at all     Not difficult at all    Review of Systems  Musculoskeletal:        Left shoulder pain, left back pain, left buttock pain  All other systems reviewed and are negative.  Objective:   Physical Exam Vitals and nursing note reviewed.  Constitutional:      Appearance: Normal appearance. She is obese.  Cardiovascular:     Rate and Rhythm: Normal rate and regular rhythm.     Pulses: Normal pulses.     Heart sounds: Normal heart sounds.  Pulmonary:     Effort:  Pulmonary effort is normal.     Breath sounds: Normal breath sounds.  Musculoskeletal:     Comments: Normal Muscle Bulk and Muscle Testing Reveals:  Upper Extremities: Right: Full ROM and Muscle Strength  5/5 Left Upper Extremity: Decreased ROM 30 Degrees and Muscle Strength 5/5 Lumbar Paraspinal Tenderness: L-4-L-5 Lower Extremities: Decreased ROM and Muscle Strength 5/5 Bilateral Lower Extremities Flexion Produces Pain into her Bilateral Patella's Arises from Chair slowly Antalgic Gait      Skin:    General: Skin is warm and dry.  Neurological:     Mental Status: She is alert and oriented to person, place, and time.  Psychiatric:        Mood and Affect: Mood normal.        Behavior: Behavior normal.          Assessment & Plan:  Chronic Left Shoulder Pain: Ortho Following. Continue HEP as Tolerated. Continue to Monitor. 04/27/2023 Chronic Bilateral Low Back Pain without sciatica: Continue HEP as Tolerated. Continue current medication regimen. Continue to monitor. 04/27/2023 Bilateral Knee with Primary Osteoarthritis: Ortho Following. Continue current medication regimen. Continue to Monitor. 04/27/2023 Polyarthralgia: Continue HEP as Tolerated. Continue to monitor. 04/27/2023 Chronic Pain Syndrome: Refilled: Oxycodone  10/325mg  one tablet every 4 hours as needed for pain #120. Second script sent for the following month. We will continue the opioid monitoring program, this consists of regular clinic visits, examinations, urine drug screen, pill counts as well as use of [place]  Controlled Substance Reporting system. A 12 month History has been reviewed on the [place]  Controlled Substance Reporting System on 04/27/2023 Fall on steps: Educated on [employer]. She verbalizes understanding.   F/U in 2 months

## 2023-04-26 NOTE — Telephone Encounter (Signed)
   Copied from CRM [id number]. Topic: Clinical - Medical Advice >> Apr 26, 2023  1:41 PM Dennison Nancy wrote: Reason for CRM: Patient request to speak to Cheryll Cockayne or her Nurse soon as possible regarding [facility]  urology  Please call patient at  [phone]

## 2023-04-26 NOTE — Telephone Encounter (Signed)
 Copied from CRM [id number]. Topic: General - Other >> Apr 26, 2023  8:08 AM Burnard DEL wrote: Reason for CRM: patient called in stating that Dr Geofm has to fax over rx to [facility] for her to receive her pads,and other supplies. She said that they already have the order ready,they just need the script sent over to them.

## 2023-04-27 ENCOUNTER — Encounter: Payer: 59 | Attending: Registered Nurse | Admitting: Registered Nurse

## 2023-04-27 ENCOUNTER — Encounter: Payer: Self-pay | Admitting: Registered Nurse

## 2023-04-27 VITALS — BP 115/77 | HR 66 | Ht 66.0 in | Wt 247.0 lb

## 2023-04-27 DIAGNOSIS — F17210 Nicotine dependence, cigarettes, uncomplicated: Secondary | ICD-10-CM | POA: Diagnosis not present

## 2023-04-27 DIAGNOSIS — Z720 Tobacco use: Secondary | ICD-10-CM

## 2023-04-27 DIAGNOSIS — G8929 Other chronic pain: Secondary | ICD-10-CM | POA: Diagnosis present

## 2023-04-27 DIAGNOSIS — M1712 Unilateral primary osteoarthritis, left knee: Secondary | ICD-10-CM

## 2023-04-27 DIAGNOSIS — Z5181 Encounter for therapeutic drug level monitoring: Secondary | ICD-10-CM | POA: Diagnosis not present

## 2023-04-27 DIAGNOSIS — W109XXD Fall (on) (from) unspecified stairs and steps, subsequent encounter: Secondary | ICD-10-CM | POA: Diagnosis not present

## 2023-04-27 DIAGNOSIS — M25512 Pain in left shoulder: Secondary | ICD-10-CM | POA: Insufficient documentation

## 2023-04-27 DIAGNOSIS — Z79891 Long term (current) use of opiate analgesic: Secondary | ICD-10-CM

## 2023-04-27 DIAGNOSIS — M170 Bilateral primary osteoarthritis of knee: Secondary | ICD-10-CM | POA: Diagnosis not present

## 2023-04-27 DIAGNOSIS — M1711 Unilateral primary osteoarthritis, right knee: Secondary | ICD-10-CM | POA: Diagnosis not present

## 2023-04-27 DIAGNOSIS — M545 Low back pain, unspecified: Secondary | ICD-10-CM | POA: Insufficient documentation

## 2023-04-27 DIAGNOSIS — M2550 Pain in unspecified joint: Secondary | ICD-10-CM

## 2023-04-27 DIAGNOSIS — G894 Chronic pain syndrome: Secondary | ICD-10-CM

## 2023-04-27 DIAGNOSIS — W109XXA Fall (on) (from) unspecified stairs and steps, initial encounter: Secondary | ICD-10-CM | POA: Diagnosis not present

## 2023-04-27 MED ORDER — DEPEND UNDERWEAR LARGE MISC: 11 refills | Status: AC

## 2023-04-27 MED ORDER — OXYCODONE-ACETAMINOPHEN 10-325 MG PO TABS: 1.0000 | ORAL_TABLET | Freq: Four times a day (QID) | ORAL | 0 refills | Status: DC | PRN

## 2023-04-27 NOTE — Telephone Encounter (Signed)
 Faxed today to fax number given.

## 2023-04-27 NOTE — Telephone Encounter (Signed)
 Patient called back  per her provider request to give information for  Incontinence supplies . Prescription  will need to be  faxed.

## 2023-04-27 NOTE — Telephone Encounter (Signed)
 Another message has been sent with same info.

## 2023-04-27 NOTE — Addendum Note (Signed)
 Addended by: Pincus Sanes on: 04/27/2023 12:17 PM   Modules accepted: Orders

## 2023-04-27 NOTE — Telephone Encounter (Signed)
 printed

## 2023-04-27 NOTE — Telephone Encounter (Signed)
 Reason for Disposition . [1] Follow-up call from patient regarding patient's clinical status AND [2] information NON-URGENT  Answer Assessment - Initial Assessment Questions 1. REASON FOR CALL or QUESTION: What is your reason for calling today? or How can I best help you? or What question do you have that I can help answer?   Patient Sending Dr Geofm the information for the Incontinence supplies.   Need a prescription for  [employer] [phone] fax [phone] email [email or web address] Patient number [id number]  Patient  states  she need  the following : Patient number [id number] Need a valid Prescription on file before they can send the order  [employer]  supplies  Large undergarments and  Bed pads  Need prescription   2. CALLER: Document the source of call. (e.g., laboratory, patient).     Patient  Protocols used: PCP Call - No Triage-[facility]

## 2023-05-06 DIAGNOSIS — H5213 Myopia, bilateral: Secondary | ICD-10-CM | POA: Diagnosis not present

## 2023-05-06 DIAGNOSIS — H40033 Anatomical narrow angle, bilateral: Secondary | ICD-10-CM | POA: Diagnosis not present

## 2023-05-07 NOTE — Telephone Encounter (Signed)
Called number provided but patient has to be established with them which she is not.  I refaxed form again to [employer] and also emailed copy to [employer].  Patient notified of above info.

## 2023-05-07 NOTE — Telephone Encounter (Signed)
Spoke with patient today.  Form as well as clinical notes have been faxed quite a few times.  She has asked me to now send order to [employer] @ [phone].

## 2023-05-07 NOTE — Telephone Encounter (Signed)
Copied from CRM [id number]. Topic: Clinical - Medical Advice >> May 07, 2023 10:32 AM Elizebeth Brooking wrote: Reason for CRM: Patient stated that [employer] sent her a messages stating that Dr.Burns has still  not ordered her items. Patient is asking for a status on this she is also asking for a nurse to give her a callback as well

## 2023-05-08 DIAGNOSIS — E1165 Type 2 diabetes mellitus with hyperglycemia: Secondary | ICD-10-CM | POA: Diagnosis not present

## 2023-05-12 DIAGNOSIS — M25512 Pain in left shoulder: Secondary | ICD-10-CM | POA: Diagnosis not present

## 2023-05-17 ENCOUNTER — Other Ambulatory Visit: Payer: Self-pay | Admitting: Internal Medicine

## 2023-05-20 NOTE — Telephone Encounter (Signed)
 Copied from CRM [id number]. Topic: General - Other >> May 20, 2023 10:14 AM Tammy Strickland wrote: Reason for CRM: [facility] [employer] called in for paperwork that was received. The problem is missing briefs and chucks for bed and only received paperwork with gloves on it. / call back # [phone]

## 2023-05-21 ENCOUNTER — Other Ambulatory Visit: Payer: Self-pay | Admitting: Internal Medicine

## 2023-05-21 NOTE — Telephone Encounter (Signed)
 Waiting for form

## 2023-05-25 ENCOUNTER — Ambulatory Visit: Payer: Self-pay | Admitting: Student

## 2023-05-25 DIAGNOSIS — Z96652 Presence of left artificial knee joint: Secondary | ICD-10-CM | POA: Diagnosis not present

## 2023-05-25 DIAGNOSIS — Z794 Long term (current) use of insulin: Secondary | ICD-10-CM

## 2023-05-25 NOTE — H&P (Signed)
 TOTAL KNEE REVISION ADMISSION H&P  Patient is being admitted for left revision total knee arthroplasty.  Subjective:  Chief Complaint:left knee pain.  HPI: Tammy Strickland, 63 y.o. female, has a history of pain and functional disability in the left knee(s) due to failed previous arthroplasty and patient has failed non-surgical conservative treatments for greater than 12 weeks to include NSAID's and/or analgesics, flexibility and strengthening excercises, supervised PT with diminished ADL's post treatment, use of assistive devices, weight reduction as appropriate, and activity modification. The indications for the revision of the total knee arthroplasty are loosening of one or more components and progressive or substantial perporsthetic bone loss. Onset of symptoms was gradual starting 4 years ago with rapidlly worsening course since that time.  Prior procedures on the left knee(s) include arthroplasty.  Patient currently rates pain in the left knee(s) at 10 out of 10 with activity. There is night pain, worsening of pain with activity and weight bearing, pain that interferes with activities of daily living, pain with passive range of motion, and joint swelling.  Patient has evidence of prosthetic loosening by imaging studies. This condition presents safety issues increasing the risk of falls.  There is no current active infection.  Patient Active Problem List   Diagnosis Date Noted   Incontinence 04/23/2023   Community acquired pneumonia 04/23/2023   Encounter for therapeutic drug monitoring 11/25/2022   Chronic left shoulder pain 08/26/2022   Currently attempting to quit using tobacco 08/26/2022   Right shoulder pain 08/02/2022   Bilateral chronic knee pain 06/02/2022   Neck pain 06/02/2022   Dry skin dermatitis 10/08/2021   Multinodular goiter 05/21/2021   Dupuytren's contracture of right hand 05/05/2021   Iron deficiency 01/28/2021   Chronic pain syndrome 01/28/2021   Ganglion cyst  01/07/2021   Right wrist pain 01/07/2021   COVID-19 virus infection 09/01/2020   Nicotine dependence, cigarettes, uncomplicated 09/01/2020   Thoracic ascending aortic aneurysm (HCC) - CTS 09/01/2020   Mucous retention cyst of lip 07/18/2020   Pica in adults 07/18/2020   Prominent metatarsal head of left foot 06/06/2020   Barrett's esophagus 04/14/2020   Cough 09/19/2019   Pancreatitis 05/27/2019   Severe obesity (BMI >= 40) (HCC) 05/24/2019   TMJ arthralgia 04/26/2019   Unilateral primary osteoarthritis, right knee 01/16/2019   Urinary frequency 11/01/2018   Anxiety 11/01/2018   RUQ pain 06/22/2018   Fatty liver 06/19/2018   Lactic acid acidosis 06/15/2018   Chronic bilateral low back pain without sciatica 05/17/2018   It band syndrome, left 12/29/2017   Trochanteric bursitis, left hip 12/29/2017   History of left knee replacement 04/21/2017   Pain in left ankle and joints of left foot 04/21/2017   Pain in left hand 04/21/2017   OSA (obstructive sleep apnea) 02/23/2017   Stroke (HCC)    Chronic fatigue 11/09/2016   Hypoxia 11/09/2016   Pruritus 07/21/2016   Frequent headaches 07/21/2016   Allergic rhinitis 07/16/2016   Vitamin D deficiency 04/16/2016   Hyperlipidemia 01/15/2016   Diabetes with neurologic complications (HCC) 11/26/2015   Chronic diastolic congestive heart failure (HCC) 11/06/2015   TIA (transient ischemic attack)    Carotid-cavernous fistula    Insomnia 10/10/2015   Depression 10/10/2015   Benign essential HTN 08/15/2015   Fibromyalgia 08/15/2015   Peripheral neuropathy 08/15/2015   CAD (coronary artery disease) 08/15/2015   COPD (chronic obstructive pulmonary disease) (HCC) 08/15/2015   Osteoporosis 08/15/2015   Left knee DJD 08/15/2015   DDD (degenerative disc disease), lumbar 08/15/2015  Occupational exposure to industrial toxins 08/15/2015   GERD (gastroesophageal reflux disease) 08/15/2015   Past Medical History:  Diagnosis Date   Ambulates  with cane 03/13/2021   Arthritis    Coronary artery disease    Diabetes mellitus without complication (HCC)    type 2    Fibromyalgia    Gallstones    GERD (gastroesophageal reflux disease)    HLD (hyperlipidemia)    Hypertension    Lupus    Myocardial infarction (HCC) 2017   Neuromuscular disorder (HCC)    Osteoporosis    Pancreatitis 05/27/2019   none since   Peripheral neuropathy 03/13/2021   all over pt pt   Shingles    Stroke (HCC) 10/2015   no residual from   Uses walker 03/13/2021   Wears glasses     Past Surgical History:  Procedure Laterality Date   ABDOMINAL HYSTERECTOMY  1989   ovaries left   APPENDECTOMY  1989   BREAST LUMPECTOMY WITH RADIOACTIVE SEED LOCALIZATION Left 06/14/2019   Procedure: LEFT BREAST LUMPECTOMY WITH RADIOACTIVE SEED LOCALIZATION X 2;  Surgeon: Abigail Miyamoto, MD;  Location: [facility];  Service: General;  Laterality: Left;  LMA   CHOLECYSTECTOMY  1989   IR GENERIC HISTORICAL  11/07/2015   IR ANGIO INTRA EXTRACRAN SEL COM CAROTID INNOMINATE BILAT MOD SED 11/07/2015 Julieanne Cotton, MD [facility]   IR GENERIC HISTORICAL  11/07/2015   IR ANGIO VERTEBRAL SEL SUBCLAVIAN INNOMINATE UNI R MOD SED 11/07/2015 Julieanne Cotton, MD [facility]   IR GENERIC HISTORICAL  11/07/2015   IR ANGIO VERTEBRAL SEL VERTEBRAL UNI L MOD SED 11/07/2015 Julieanne Cotton, MD [facility]   IR GENERIC HISTORICAL  11/07/2015   IR ANGIOGRAM EXTREMITY LEFT 11/07/2015 Julieanne Cotton, MD [facility]   MASS EXCISION Left 03/17/2021   Procedure: EXCISION MASS,left middle finger ulnar border mass excision,left thumb volar retinacular ganglion cyst excision;  Surgeon: Gomez Cleverly, MD;  Location: [facility];  Service: Orthopedics;  Laterality: Left;   TONSILLECTOMY  2000   adenoids removed   TOTAL KNEE ARTHROPLASTY Left 03/12/2017   Procedure: LEFT TOTAL KNEE ARTHROPLASTY;  Surgeon: Kathryne Hitch, MD;  Location: [facility];  Service: Orthopedics;   Laterality: Left;  Adductor Block   TRIGGER FINGER RELEASE Left 03/17/2021   Procedure: left trigger thumb release;  Surgeon: Gomez Cleverly, MD;  Location: [facility];  Service: Orthopedics;  Laterality: Left;  with local anesthesia    Current Outpatient Medications  Medication Sig Dispense Refill Last Dose/Taking   ACCU-CHEK GUIDE TEST test strip TEST TWICE DAILY 200 strip 2    Accu-Chek Softclix Lancets lancets ONE EACH BY OTHER ROUTE FOUR TIMES DAILY. 100 each 11    albuterol (PROVENTIL) (2.5 MG/3ML) 0.083% nebulizer solution Take 3 mLs (2.5 mg total) by nebulization every 6 (six) hours as needed for wheezing or shortness of breath. 150 mL 1    albuterol (VENTOLIN HFA) 108 (90 Base) MCG/ACT inhaler Inhale 2 puffs into the lungs every 6 (six) hours as needed for wheezing or shortness of breath. 8 g 8    amitriptyline (ELAVIL) 75 MG tablet Take 1 tablet by mouth at bedtime (Patient taking differently: Take 150 mg by mouth at bedtime.) 30 tablet 0    aspirin EC 81 MG tablet Take 1 tablet (81 mg total) by mouth daily.      atorvastatin (LIPITOR) 20 MG tablet Take 1 tablet by mouth every day 30 tablet 11    cloNIDine (CATAPRES) 0.3 MG  tablet Take 1 tablet by mouth 3 times a day 90 tablet 11    Continuous Glucose Sensor (DEXCOM G7 SENSOR) MISC Use 1 sensor for continuous glucose monitoring every 10 days 30 days 3 each 5    fluticasone (FLONASE) 50 MCG/ACT nasal spray Shake liquid & use 1 spray into each nostril every day as needed for allergies 16 g 11    furosemide (LASIX) 80 MG tablet Take 1 tablet by mouth every day 30 tablet 11    IBU 800 MG tablet Take 1 tablet 3 times a day by oral route as needed for 10 days.      Incontinence Supply Disposable (DEPEND FIT-FLEX-WOMEN-M) MISC Use as directed      Incontinence Supply Disposable (DEPEND UNDERWEAR LARGE) MISC UAD for incontinence 90 each 11    insulin glargine, 2 Unit Dial, (TOUJEO MAX SOLOSTAR) 300 UNIT/ML Solostar Pen Inject 160  Units into the skin every evening. And pen needles 1/day (Patient taking differently: Inject 150 Units into the skin 2 (two) times daily.) 45 mL 3    Insulin Pen Needle (PEN NEEDLES) 32G X 5 MM MISC UAD daily for insulin 90 each 3    KLOR-CON M10 10 MEQ tablet Take 2 tablets by mouth every day (Patient taking differently: Take 10 mEq by mouth 2 (two) times daily.) 60 tablet 11    losartan (COZAAR) 50 MG tablet Take 1 tablet by mouth twice daily 60 tablet 11    metoprolol (TOPROL-XL) 200 MG 24 hr tablet Take 1 tablet by mouth twice daily 60 tablet 11    NARCAN 4 MG/0.1ML LIQD nasal spray kit Place 1 spray into the nose once.      nitroGLYCERIN (NITROSTAT) 0.4 MG SL tablet Place 0.4 mg under the tongue every 5 (five) minutes as needed for chest pain.      oxyCODONE-acetaminophen (PERCOCET) 10-325 MG tablet Take 1 tablet by mouth every 6 (six) hours as needed. Do Not Fill Before 02/ 02/2024 120 tablet 0    pantoprazole (PROTONIX) 40 MG tablet Take 1 tablet by mouth twice daily 60 tablet 11    tirzepatide (MOUNJARO) 5 MG/0.5ML Pen Inject 5 mg into the skin once a week. (Patient taking differently: Inject 5 mg into the skin every Sunday.) 2 mL 5    tiZANidine (ZANAFLEX) 4 MG tablet Take 1 tablet by mouth 3 times a day 90 tablet 3    ZTLIDO 1.8 % PTCH Apply 3 patches topically daily as needed (shoulder/back pain.).      No current facility-administered medications for this visit.   Allergies  Allergen Reactions   Bacitracin-Polymyxin B Hives   Metformin And Related Hives, Diarrhea and Other (See Comments)    Lactic acidosis   Other Other (See Comments)    H/o pancreatitis   Ozempic (0.25 Or 0.5 Mg-Dose) [Semaglutide(0.25 Or 0.5mg -Dos)]     H/o pancreatitis   Quinolones     Avoid due to aortic aneurysm   Bacitracin Hives    Social History   Tobacco Use   Smoking status: Every Day    Current packs/day: 0.50    Average packs/day: 0.5 packs/day for 35.0 years (17.5 ttl pk-yrs)    Types:  Cigarettes   Smokeless tobacco: Never   Tobacco comments:    referred  to smoking  cessation  classes at  [facility]   Substance Use Topics   Alcohol use: Yes    Comment: occasional    Family History  Problem Relation Age of Onset  Hyperlipidemia Mother    Hypertension Mother    Stroke Mother    Thyroid disease Mother    Heart attack Mother    Hyperlipidemia Father    Hypertension Father    Stroke Father    Heart attack Father    Hypertension Sister    Stroke Sister    Thyroid disease Sister    Breast cancer Sister    Crohn's disease Sister    Hypertension Sister    Hypertension Brother    Diabetes Brother    Hypertension Brother       Review of Systems  Musculoskeletal:  Positive for arthralgias, gait problem and joint swelling.  All other systems reviewed and are negative.    Objective:  Physical Exam Constitutional:      Appearance: She is obese.  HENT:     Head: Normocephalic and atraumatic.     Nose: Nose normal.     Mouth/Throat:     Mouth: Mucous membranes are moist.     Pharynx: Oropharynx is clear.  Eyes:     Conjunctiva/sclera: Conjunctivae normal.  Cardiovascular:     Rate and Rhythm: Normal rate and regular rhythm.     Pulses: Normal pulses.     Heart sounds: Normal heart sounds.  Pulmonary:     Effort: Pulmonary effort is normal.     Breath sounds: Normal breath sounds.  Abdominal:     General: Abdomen is flat.     Palpations: Abdomen is soft.  Genitourinary:    Comments: Deferred. Musculoskeletal:     Cervical back: Normal range of motion and neck supple.     Comments: Examination of the left knee reveals a healed anterior incision. No warmth or erythema. No effusion. She has global diffuse tenderness to palpation. Range of motion 10 to 90 degrees. She guards during exam. She has some laxity in flexion. Painless logrolling of the hip.  Neurovascular intact distally.  Mild LE edema bilaterally. Calves soft and non-tender.   Skin:    General:  Skin is warm and dry.     Capillary Refill: Capillary refill takes less than 2 seconds.  Neurological:     General: No focal deficit present.     Mental Status: She is alert and oriented to person, place, and time.  Psychiatric:        Mood and Affect: Mood normal.        Behavior: Behavior normal.        Thought Content: Thought content normal.        Judgment: Judgment normal.     Vital signs in last 24 hours: @VSRANGES @  Labs:  Estimated body mass index is 42.97 kg/m as calculated from the following:   Height as of 06/04/23: 5\' 7"  (1.702 m).   Weight as of 06/04/23: 124.5 kg.  Imaging Review Plain radiographs demonstrate severe degenerative joint disease of the left knee(s). The overall alignment is mild varus.There is evidence of loosening of the tibial components. The bone quality appears to be adequate for age and reported activity level.    Assessment/Plan:  End stage arthritis, left knee(s) with failed previous arthroplasty.   The patient history, physical examination, clinical judgment of the provider and imaging studies are consistent with end stage degenerative joint disease of the left knee(s), previous total knee arthroplasty. Revision total knee arthroplasty is deemed medically necessary. The treatment options including medical management, injection therapy, arthroscopy and revision arthroplasty were discussed at length. The risks and benefits of revision total knee arthroplasty  were presented and reviewed. The risks due to aseptic loosening, infection, stiffness, patella tracking problems, thromboembolic complications and other imponderables were discussed. The patient acknowledged the explanation, agreed to proceed with the plan and consent was signed. Patient is being admitted for inpatient treatment for surgery, pain control, PT, OT, prophylactic antibiotics, VTE prophylaxis, progressive ambulation and ADL's and discharge planning.The patient is planning to be  discharged home with OPPT after a 2-3 night stay.    Therapy Plans: outpatient therapy. At [facility] center 06/14/23. Disposition: Home with son and hired help. We discussed no SNF. Patient understands. Planned DVT Prophylaxis: aspirin 81mg  BID DME needed: Has rolling walker and cane. PCP: Cleared. TXA: IV Allergies: NDKA. Anesthesia Concerns: None. BMI: 44.1 Last HgbA1c: 6.6 Other: - Staples** 1 every 3-4 days. Discussed risk. - T2DM, insulin. - Aspirin 81mg  baseline. - Mounjaro, patient aware to stop medication 7 days prior to surgery. - Chronic low back pain. - Pain management Oxycodone 10mg /325mg  4-5 times per day. - Oxycodone 10-15mg , zofran, methocarbamol, Ibuprofen 800mg . - 06/04/23: K+ 3.5, Cr. 0.72, Hgb hemoglobin 9.7. Due to low hemoglobin will have to reschedule surgery. Patient to follow-up with PCP for management.

## 2023-05-29 ENCOUNTER — Other Ambulatory Visit: Payer: Self-pay | Admitting: Physical Medicine and Rehabilitation

## 2023-05-29 NOTE — Progress Notes (Signed)
 Anesthesia Review:  PCP: Cheryll Cockayne  Cardiologist : Cardiothoracic- [facility] L[facility] 03/10/23  Chest x-ray : 04/23/23- 2 view  EKG : .12/07/22  Echo : 11/01/15  Stress test: Cardiac Cath :  Activity level:  Sleep Study/ CPAP : Fasting Blood Sugar :      / Checks Blood Sugar -- times a day:   Blood Thinner/ Instructions /Last Dose: ASA / Instructions/ Last Dose :    81 mg aspirin    DM- type  2 - Dexcom monitor  Hgba1c- 04/23/23- 7.3  Toujeo- takes I'm pm - last dose one  Mounjaro- Sundays- last dose on    04/23/23=- cbc/diff and CMP    Pt did not forget appt she just woke up late.  Completed med hx and preop instructions via phone.  Bag with instructions placed in lab.  Lab appt made on 06/03/23 since has appt with Ortho that day per pt request.  PT instructed at end of phone call on 05/31/23 to contact Admitting at [phone].  PT voiced understanding.

## 2023-05-29 NOTE — Patient Instructions (Signed)
 SURGICAL WAITING ROOM VISITATION  Patients having surgery or a procedure may have no more than 2 support people in the waiting area - these visitors may rotate.    Children under the age of 6 must have an adult with them who is not the patient.  Due to an increase in RSV and influenza rates and associated hospitalizations, children ages 52 and under may not visit patients in [facility] hospitals.  Visitors with respiratory illnesses are discouraged from visiting and should remain at home.  If the patient needs to stay at the hospital during part of their recovery, the visitor guidelines for inpatient rooms apply. Pre-op nurse will coordinate an appropriate time for 1 support person to accompany patient in pre-op.  This support person may not rotate.    Please refer to the [facility] website for the visitor guidelines for Inpatients (after your surgery is over and you are in a regular room).       Your procedure is scheduled on:  06/09/2023    Report to [facility] Main Entrance    Report to admitting at   [phone]   Call this number if you have problems the morning of surgery [phone]   Do not eat food :After Midnight.   After Midnight you may have the following liquids until _ 0745_____ AM  DAY OF SURGERY  Water Non-Citrus Juices (without pulp, NO RED-Apple, White grape, White cranberry) Black Coffee (NO MILK/CREAM OR CREAMERS, sugar ok)  Clear Tea (NO MILK/CREAM OR CREAMERS, sugar ok) regular and decaf                             Plain Jell-O (NO RED)                                           Fruit ices (not with fruit pulp, NO RED)                                     Popsicles (NO RED)                                                               Sports drinks like Gatorade (NO RED)                   The day of surgery:  Drink ONE (1) Pre-Surgery Clear Ensure or G2 at  0745AM  ( have completed by ) the morning of surgery. Drink in one sitting. Do not sip.   This drink was given to you during your hospital  pre-op appointment visit. Nothing else to drink after completing the  Pre-Surgery Clear Ensure or G2.          If you have questions, please contact your surgeon's office.      Oral Hygiene is also important to reduce your risk of infection.  Remember - BRUSH YOUR TEETH THE MORNING OF SURGERY WITH YOUR REGULAR TOOTHPASTE  DENTURES WILL BE REMOVED PRIOR TO SURGERY PLEASE DO NOT APPLY "Poly grip" OR ADHESIVES!!!   Do NOT smoke after Midnight   Stop all vitamins and herbal supplements 7 days before surgery.   Take these medicines the morning of surgery with A SIP OF WATER:  nebulzier if needed, inhalers as usual and bring, clonidine, flonase if needed, protonix, metoprolol             Toujeo- Take 1/2 dose nite before surgery   Mounjaro- Last dose on n   DO NOT TAKE ANY ORAL DIABETIC MEDICATIONS DAY OF YOUR SURGERY  Bring CPAP mask and tubing day of surgery.                              You may not have any metal on your body including hair pins, jewelry, and body piercing             Do not wear make-up, lotions, powders, perfumes/cologne, or deodorant  Do not wear nail polish including gel and S&S, artificial/acrylic nails, or any other type of covering on natural nails including finger and toenails. If you have artificial nails, gel coating, etc. that needs to be removed by a nail salon please have this removed prior to surgery or surgery may need to be canceled/ delayed if the surgeon/ anesthesia feels like they are unable to be safely monitored.   Do not shave  48 hours prior to surgery.               Men may shave face and neck.   Do not bring valuables to the hospital. [facility] IS NOT             RESPONSIBLE   FOR VALUABLES.   Contacts, glasses, dentures or bridgework may not be worn into surgery.   Bring small overnight bag day of surgery.   DO NOT BRING YOUR HOME MEDICATIONS TO  THE HOSPITAL. PHARMACY WILL DISPENSE MEDICATIONS LISTED ON YOUR MEDICATION LIST TO YOU DURING YOUR ADMISSION IN THE HOSPITAL!    Patients discharged on the day of surgery will not be allowed to drive home.  Someone NEEDS to stay with you for the first 24 hours after anesthesia.   Special Instructions: Bring a copy of your healthcare power of attorney and living will documents the day of surgery if you haven't scanned them before.              Please read over the following fact sheets you were given: IF YOU HAVE QUESTIONS ABOUT YOUR PRE-OP INSTRUCTIONS PLEASE CALL [phone]   If you received a COVID test during your pre-op visit  it is requested that you wear a mask when out in public, stay away from anyone that may not be feeling well and notify your surgeon if you develop symptoms. If you test positive for Covid or have been in contact with anyone that has tested positive in the last 10 days please notify you surgeon.      Pre-operative 5 CHG Bath Instructions   You can play a key role in reducing the risk of infection after surgery. Your skin needs to be as free of germs as possible. You can reduce the number of germs on your skin by washing with CHG (chlorhexidine gluconate) soap before surgery. CHG is an antiseptic soap that kills germs and continues  to kill germs even after washing.   DO NOT use if you have an allergy to chlorhexidine/CHG or antibacterial soaps. If your skin becomes reddened or irritated, stop using the CHG and notify one of our RNs at [phone].   Please shower with the CHG soap starting 4 days before surgery using the following schedule:     Please keep in mind the following:  DO NOT shave, including legs and underarms, starting the day of your first shower.   You may shave your face at any point before/day of surgery.  Place clean sheets on your bed the day you start using CHG soap. Use a clean washcloth (not used since being washed) for each shower. DO  NOT sleep with pets once you start using the CHG.   CHG Shower Instructions:  If you choose to wash your hair and private area, wash first with your normal shampoo/soap.  After you use shampoo/soap, rinse your hair and body thoroughly to remove shampoo/soap residue.  Turn the water OFF and apply about 3 tablespoons (45 ml) of CHG soap to a CLEAN washcloth.  Apply CHG soap ONLY FROM YOUR NECK DOWN TO YOUR TOES (washing for 3-5 minutes)  DO NOT use CHG soap on face, private areas, open wounds, or sores.  Pay special attention to the area where your surgery is being performed.  If you are having back surgery, having someone wash your back for you may be helpful. Wait 2 minutes after CHG soap is applied, then you may rinse off the CHG soap.  Pat dry with a clean towel  Put on clean clothes/pajamas   If you choose to wear lotion, please use ONLY the CHG-compatible lotions on the back of this paper.     Additional instructions for the day of surgery: DO NOT APPLY any lotions, deodorants, cologne, or perfumes.   Put on clean/comfortable clothes.  Brush your teeth.  Ask your nurse before applying any prescription medications to the skin.      CHG Compatible Lotions   Aveeno Moisturizing lotion  Cetaphil Moisturizing Cream  Cetaphil Moisturizing Lotion  Clairol Herbal Essence Moisturizing Lotion, Dry Skin  Clairol Herbal Essence Moisturizing Lotion, Extra Dry Skin  Clairol Herbal Essence Moisturizing Lotion, Normal Skin  Curel Age Defying Therapeutic Moisturizing Lotion with Alpha Hydroxy  Curel Extreme Care Body Lotion  Curel Soothing Hands Moisturizing Hand Lotion  Curel Therapeutic Moisturizing Cream, Fragrance-Free  Curel Therapeutic Moisturizing Lotion, Fragrance-Free  Curel Therapeutic Moisturizing Lotion, Original Formula  Eucerin Daily Replenishing Lotion  Eucerin Dry Skin Therapy Plus Alpha Hydroxy Crme  Eucerin Dry Skin Therapy Plus Alpha Hydroxy Lotion  Eucerin Original  Crme  Eucerin Original Lotion  Eucerin Plus Crme Eucerin Plus Lotion  Eucerin TriLipid Replenishing Lotion  Keri Anti-Bacterial Hand Lotion  Keri Deep Conditioning Original Lotion Dry Skin Formula Softly Scented  Keri Deep Conditioning Original Lotion, Fragrance Free Sensitive Skin Formula  Keri Lotion Fast Absorbing Fragrance Free Sensitive Skin Formula  Keri Lotion Fast Absorbing Softly Scented Dry Skin Formula  Keri Original Lotion  Keri Skin Renewal Lotion Keri Silky Smooth Lotion  Keri Silky Smooth Sensitive Skin Lotion  Nivea Body Creamy Conditioning Oil  Nivea Body Extra Enriched Teacher, adult education Moisturizing Lotion Nivea Crme  Nivea Skin Firming Lotion  NutraDerm 30 Skin Lotion  NutraDerm Skin Lotion  NutraDerm Therapeutic Skin Cream  NutraDerm Therapeutic Skin Lotion  ProShield Protective Hand Cream  Provon moisturizing lotion

## 2023-05-30 ENCOUNTER — Other Ambulatory Visit: Payer: Self-pay | Admitting: Internal Medicine

## 2023-05-31 ENCOUNTER — Encounter (HOSPITAL_COMMUNITY): Payer: Self-pay

## 2023-05-31 ENCOUNTER — Encounter (HOSPITAL_COMMUNITY)
Admission: RE | Admit: 2023-05-31 | Discharge: 2023-05-31 | Disposition: A | Discharge status: Home / Self Care | Payer: 59 | Source: Ambulatory Visit

## 2023-05-31 ENCOUNTER — Other Ambulatory Visit: Payer: Self-pay

## 2023-05-31 DIAGNOSIS — Z01818 Encounter for other preprocedural examination: Secondary | ICD-10-CM

## 2023-06-03 ENCOUNTER — Encounter (HOSPITAL_COMMUNITY): Admission: RE | Admit: 2023-06-03 | Payer: 59 | Source: Ambulatory Visit

## 2023-06-03 NOTE — Telephone Encounter (Signed)
 Form received and faxed back today.

## 2023-06-04 ENCOUNTER — Encounter (HOSPITAL_COMMUNITY)
Admission: RE | Admit: 2023-06-04 | Discharge: 2023-06-04 | Disposition: A | Discharge status: Home / Self Care | Payer: 59 | Source: Ambulatory Visit | Attending: Orthopedic Surgery | Admitting: Orthopedic Surgery

## 2023-06-04 DIAGNOSIS — E1142 Type 2 diabetes mellitus with diabetic polyneuropathy: Secondary | ICD-10-CM | POA: Diagnosis not present

## 2023-06-04 DIAGNOSIS — D649 Anemia, unspecified: Secondary | ICD-10-CM | POA: Insufficient documentation

## 2023-06-04 DIAGNOSIS — Z6841 Body Mass Index (BMI) 40.0 and over, adult: Secondary | ICD-10-CM | POA: Insufficient documentation

## 2023-06-04 DIAGNOSIS — Z79891 Long term (current) use of opiate analgesic: Secondary | ICD-10-CM | POA: Diagnosis not present

## 2023-06-04 DIAGNOSIS — Z01818 Encounter for other preprocedural examination: Secondary | ICD-10-CM | POA: Diagnosis present

## 2023-06-04 DIAGNOSIS — K219 Gastro-esophageal reflux disease without esophagitis: Secondary | ICD-10-CM | POA: Diagnosis not present

## 2023-06-04 DIAGNOSIS — E669 Obesity, unspecified: Secondary | ICD-10-CM | POA: Diagnosis not present

## 2023-06-04 DIAGNOSIS — I5032 Chronic diastolic (congestive) heart failure: Secondary | ICD-10-CM | POA: Insufficient documentation

## 2023-06-04 DIAGNOSIS — I712 Thoracic aortic aneurysm, without rupture, unspecified: Secondary | ICD-10-CM | POA: Insufficient documentation

## 2023-06-04 DIAGNOSIS — Z794 Long term (current) use of insulin: Secondary | ICD-10-CM | POA: Insufficient documentation

## 2023-06-04 DIAGNOSIS — Z01812 Encounter for preprocedural laboratory examination: Secondary | ICD-10-CM | POA: Insufficient documentation

## 2023-06-04 DIAGNOSIS — T84093A Other mechanical complication of internal left knee prosthesis, initial encounter: Secondary | ICD-10-CM | POA: Diagnosis not present

## 2023-06-04 DIAGNOSIS — I700 Atherosclerosis of aorta: Secondary | ICD-10-CM | POA: Insufficient documentation

## 2023-06-04 DIAGNOSIS — G8929 Other chronic pain: Secondary | ICD-10-CM | POA: Insufficient documentation

## 2023-06-04 DIAGNOSIS — Z8673 Personal history of transient ischemic attack (TIA), and cerebral infarction without residual deficits: Secondary | ICD-10-CM | POA: Insufficient documentation

## 2023-06-04 DIAGNOSIS — M797 Fibromyalgia: Secondary | ICD-10-CM | POA: Insufficient documentation

## 2023-06-04 DIAGNOSIS — J449 Chronic obstructive pulmonary disease, unspecified: Secondary | ICD-10-CM | POA: Insufficient documentation

## 2023-06-04 DIAGNOSIS — F17200 Nicotine dependence, unspecified, uncomplicated: Secondary | ICD-10-CM | POA: Diagnosis not present

## 2023-06-04 DIAGNOSIS — I110 Hypertensive heart disease with heart failure: Secondary | ICD-10-CM | POA: Insufficient documentation

## 2023-06-04 HISTORY — DX: Atherosclerosis of aorta: I70.0

## 2023-06-04 HISTORY — DX: Transient cerebral ischemic attack, unspecified: G45.9

## 2023-06-04 LAB — BASIC METABOLIC PANEL
Anion gap: 12 (ref 5–15)
BUN: 12 mg/dL (ref 8–23)
CO2: 24 mmol/L (ref 22–32)
Calcium: 8.9 mg/dL (ref 8.9–10.3)
Chloride: 98 mmol/L (ref 98–111)
Creatinine, Ser: 0.72 mg/dL (ref 0.44–1.00)
GFR, Estimated: >60 mL/min (ref >60)
Glucose, Bld: 111 mg/dL — ABNORMAL HIGH (ref 70–99)
Potassium: 3.5 mmol/L (ref 3.5–5.1)
Sodium: 134 mmol/L — ABNORMAL LOW (ref 135–145)

## 2023-06-04 LAB — CBC
HCT: 34.3 % — ABNORMAL LOW (ref 36.0–46.0)
Hemoglobin: 9.7 g/dL — ABNORMAL LOW (ref 12.0–15.0)
MCH: 21.7 pg — ABNORMAL LOW (ref 26.0–34.0)
MCHC: 28.3 g/dL — ABNORMAL LOW (ref 30.0–36.0)
MCV: 76.7 fL — ABNORMAL LOW (ref 80.0–100.0)
Platelets: 332 10*3/uL (ref 150–400)
RBC: 4.47 MIL/uL (ref 3.87–5.11)
RDW: 17.9 % — ABNORMAL HIGH (ref 11.5–15.5)
WBC: 11.1 10*3/uL — ABNORMAL HIGH (ref 4.0–10.5)
nRBC: 0 % (ref 0.0–0.2)

## 2023-06-04 LAB — SURGICAL PCR SCREEN
MRSA, PCR: NEGATIVE
Staphylococcus aureus: NEGATIVE

## 2023-06-04 LAB — HEMOGLOBIN A1C
Hgb A1c MFr Bld: 6.6 % — ABNORMAL HIGH (ref 4.8–5.6)
Mean Plasma Glucose: 142.72 mg/dL

## 2023-06-07 ENCOUNTER — Ambulatory Visit: Payer: Self-pay | Admitting: Student

## 2023-06-07 ENCOUNTER — Encounter (HOSPITAL_COMMUNITY): Payer: Self-pay | Admitting: Medical

## 2023-06-07 ENCOUNTER — Encounter (HOSPITAL_COMMUNITY): Admission: RE | Admit: 2023-06-07 | Payer: 59 | Source: Ambulatory Visit

## 2023-06-07 ENCOUNTER — Encounter (HOSPITAL_COMMUNITY): Payer: Self-pay

## 2023-06-07 NOTE — Progress Notes (Signed)
 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  She does have evidence of aortic atherosclerosis on prior imaging.     Patient follows regularly with PCP.  Last seen on 04/23/2023 and treated for pneumonia with antibiotics and inhalers.  Chest x-ray obtained was negative.  Blood pressure has been controlled.  Diabetes is controlled.  Last A1c was 6.6.  Medical clearance signed on 12/24/22 that patient is low risk. (Scanned in media tab on 04/15/2023)  Patient has been referred to cardiothoracic surgery due to TAA.  Last seen on 03/10/2023.  Imaging on 03/03/2023 showed TAA measuring 4.0 cm.  Advised to follow-up in 1 year for repeat imaging.   VS: BP 132/78   Pulse 62   Temp 36.7 C (Oral)   Resp 18   Ht 5\' 7"  (1.702 m)   Wt 124.5 kg   SpO2 99%   BMI 42.97 kg/m   PROVIDERS: Pincus Sanes, MD   LABS:  Drop in Hgb noted from prior labs. Routed to Dr. Veda Canning. and Dr. Lawerance Bach (all labs ordered are listed, but only abnormal results are displayed)  Labs Reviewed  HEMOGLOBIN A1C - Abnormal; Notable for the following components:      Result Value   Hgb A1c MFr Bld 6.6 (*)    All other components within normal limits  BASIC METABOLIC PANEL - Abnormal; Notable for the following components:   Sodium 134 (*)    Glucose, Bld 111 (*)    All other  components within normal limits  CBC - Abnormal; Notable for the following components:   WBC 11.1 (*)    Hemoglobin 9.7 (*)    HCT 34.3 (*)    MCV 76.7 (*)    MCH 21.7 (*)    MCHC 28.3 (*)    RDW 17.9 (*)    All other components within normal limits  SURGICAL PCR SCREEN  TYPE AND SCREEN     IMAGES:  CXR 04/23/23:  IMPRESSION: No acute cardiopulmonary disease.   CTA Chest 03/03/23:  IMPRESSION: 1. No acute intrathoracic pathology. No aortic dissection. 2. Top-normal ascending aorta measuring up to 4 cm. Recommend annual imaging followup by CTA or MRA. This recommendation follows 2010 ACCF/AHA/AATS/ACR/ASA/SCA/SCAI/SIR/STS/SVM Guidelines for the Diagnosis and Management of Patients with Thoracic Aortic Disease. Circulation. 2010; 121: J478-G956. Aortic aneurysm NOS (ICD10-I71.9)  EKG:   CV:  Echo 11/01/2015:  Study Conclusions  - Left ventricle: The cavity size was normal. Wall thickness was   increased in a pattern of mild LVH. Systolic function was normal.   The estimated ejection fraction was in the range of 55% to 60%.   Wall motion was normal; there were no regional wall motion   abnormalities. Doppler parameters are consistent with abnormal   left ventricular relaxation (grade 1 diastolic dysfunction). - Aortic root: The aortic root was  mildly dilated. - Mitral valve: Calcified annulus.  Impressions:  - Technically difficult; definity used; normal LV systolic   function; mild LVH; grade 1 diastolic dysfunction.  Past Medical History:  Diagnosis Date   Ambulates with cane 03/13/2021   Arthritis    Coronary artery disease    Diabetes mellitus without complication (HCC)    type 2    Fibromyalgia    Gallstones    GERD (gastroesophageal reflux disease)    HLD (hyperlipidemia)    Hypertension    Lupus    Myocardial infarction (HCC) 2017   Neuromuscular disorder (HCC)    Osteoporosis    Pancreatitis 05/27/2019   none since   Peripheral neuropathy  03/13/2021   all over pt pt   Shingles    Stroke (HCC) 10/2015   no residual from   Uses walker 03/13/2021   Wears glasses     Past Surgical History:  Procedure Laterality Date   ABDOMINAL HYSTERECTOMY  1989   ovaries left   APPENDECTOMY  1989   BREAST LUMPECTOMY WITH RADIOACTIVE SEED LOCALIZATION Left 06/14/2019   Procedure: LEFT BREAST LUMPECTOMY WITH RADIOACTIVE SEED LOCALIZATION X 2;  Surgeon: Abigail Miyamoto, MD;  Location: [facility] OR;  Service: General;  Laterality: Left;  LMA   CHOLECYSTECTOMY  1989   IR GENERIC HISTORICAL  11/07/2015   IR ANGIO INTRA EXTRACRAN SEL COM CAROTID INNOMINATE BILAT MOD SED 11/07/2015 Julieanne Cotton, MD [facility]   IR GENERIC HISTORICAL  11/07/2015   IR ANGIO VERTEBRAL SEL SUBCLAVIAN INNOMINATE UNI R MOD SED 11/07/2015 Julieanne Cotton, MD [facility]   IR GENERIC HISTORICAL  11/07/2015   IR ANGIO VERTEBRAL SEL VERTEBRAL UNI L MOD SED 11/07/2015 Julieanne Cotton, MD [facility]   IR GENERIC HISTORICAL  11/07/2015   IR ANGIOGRAM EXTREMITY LEFT 11/07/2015 Julieanne Cotton, MD [facility]   MASS EXCISION Left 03/17/2021   Procedure: EXCISION MASS,left middle finger ulnar border mass excision,left thumb volar retinacular ganglion cyst excision;  Surgeon: Gomez Cleverly, MD;  Location: [facility];  Service: Orthopedics;  Laterality: Left;   TONSILLECTOMY  2000   adenoids removed   TOTAL KNEE ARTHROPLASTY Left 03/12/2017   Procedure: LEFT TOTAL KNEE ARTHROPLASTY;  Surgeon: Kathryne Hitch, MD;  Location: [facility];  Service: Orthopedics;  Laterality: Left;  Adductor Block   TRIGGER FINGER RELEASE Left 03/17/2021   Procedure: left trigger thumb release;  Surgeon: Gomez Cleverly, MD;  Location: [facility];  Service: Orthopedics;  Laterality: Left;  with local anesthesia    MEDICATIONS:  ACCU-CHEK GUIDE TEST test strip   Accu-Chek Softclix Lancets lancets   albuterol (PROVENTIL) (2.5 MG/3ML) 0.083% nebulizer  solution   albuterol (VENTOLIN HFA) 108 (90 Base) MCG/ACT inhaler   amitriptyline (ELAVIL) 75 MG tablet   aspirin EC 81 MG tablet   atorvastatin (LIPITOR) 20 MG tablet   cloNIDine (CATAPRES) 0.3 MG tablet   Continuous Glucose Sensor (DEXCOM G7 SENSOR) MISC   fluticasone (FLONASE) 50 MCG/ACT nasal spray   furosemide (LASIX) 80 MG tablet   IBU 800 MG tablet   Incontinence Supply Disposable (DEPEND FIT-FLEX-WOMEN-M) MISC   Incontinence Supply Disposable (DEPEND UNDERWEAR LARGE) MISC   insulin glargine, 2 Unit Dial, (TOUJEO MAX SOLOSTAR) 300 UNIT/ML Solostar Pen   Insulin Pen Needle (PEN NEEDLES) 32G X 5 MM MISC   KLOR-CON M10 10 MEQ tablet   losartan (COZAAR) 50 MG tablet   metoprolol (TOPROL-XL) 200 MG 24 hr tablet   NARCAN 4 MG/0.1ML LIQD nasal spray kit  nitroGLYCERIN (NITROSTAT) 0.4 MG SL tablet   oxyCODONE-acetaminophen (PERCOCET) 10-325 MG tablet   pantoprazole (PROTONIX) 40 MG tablet   tirzepatide (MOUNJARO) 5 MG/0.5ML Pen   tiZANidine (ZANAFLEX) 4 MG tablet   ZTLIDO 1.8 % PTCH   No current facility-administered medications for this encounter.   Marcille Blanco [facility] Surgical Short Stay/Anesthesiology [facility] Phone [phone] 06/07/2023 9:12 AM

## 2023-06-08 DIAGNOSIS — E1165 Type 2 diabetes mellitus with hyperglycemia: Secondary | ICD-10-CM | POA: Diagnosis not present

## 2023-06-09 ENCOUNTER — Inpatient Hospital Stay (HOSPITAL_COMMUNITY): Admission: RE | Admit: 2023-06-09 | Payer: 59 | Source: Home / Self Care | Admitting: Orthopedic Surgery

## 2023-06-09 ENCOUNTER — Encounter (HOSPITAL_COMMUNITY): Admission: RE | Payer: Self-pay | Source: Home / Self Care

## 2023-06-09 LAB — TYPE AND SCREEN
ABO/RH(D): O POS
Antibody Screen: NEGATIVE

## 2023-06-09 SURGERY — TOTAL KNEE REVISION
Anesthesia: Spinal | Site: Knee | Laterality: Left

## 2023-06-16 ENCOUNTER — Ambulatory Visit: Payer: Self-pay | Admitting: Internal Medicine

## 2023-06-16 ENCOUNTER — Ambulatory Visit: Payer: 59

## 2023-06-16 VITALS — Ht 66.0 in | Wt 274.0 lb

## 2023-06-16 DIAGNOSIS — Z0000 Encounter for general adult medical examination without abnormal findings: Secondary | ICD-10-CM

## 2023-06-16 NOTE — Telephone Encounter (Signed)
  Chief Complaint: cough, runny nose Symptoms: cough, runny nose, low grade temp at times,  Frequency: started last week Pertinent Negatives: Patient denies fever, hemoptysis, lung/cards hx,   Disposition: [] ED /[] Urgent Care (no appt availability in office) / [x] Appointment(In office/virtual)/ []  [place]/ [] Home Care/ [] Refused Recommended Disposition /[] [facility]/ []  Follow-up with PCP  Additional Notes: Pt states that she started with a cough about a week ago. Hx of pneumonia, feels similar. Pt states that she has CP when she coughs only. Pt states that she is still performing ADLs, but does admit to minimal SOB. Pt states that she is using OTC meds. Pt requesting appt for 3/10 or 3/11 d/t transportation issues. RN confirmed that pt did not want appt today, as there were avail appts today, pt declines. Pt declines VV as well. Pt advised to seek ED treatment if the s/s worsen.  Sched 3/10 at pt request.   Copied from CRM [id number]. Topic: Clinical - Red Word Triage >> Jun 16, 2023  9:11 AM Theodis Sato wrote: Red Word that prompted transfer to Nurse Triage: Wheezing, extreme body aches with a bad cough. Patient states she thinks she has pneumonia. Reason for Disposition  Cough  Answer Assessment - Initial Assessment Questions 1. ONSET: "When did the cough begin?"      Last week 2. SEVERITY: "How bad is the cough today?"      severe 3. SPUTUM: "Describe the color of your sputum" (none, dry cough; clear, white, yellow, green)     Tannish yellowish  4. HEMOPTYSIS: "Are you coughing up any blood?" If so ask: "How much?" (flecks, streaks, tablespoons, etc.)     Denies, only when I blow my nose then I get some up 5. DIFFICULTY BREATHING: "Are you having difficulty breathing?" If Yes, ask: "How bad is it?" (e.g., mild, moderate, severe)    - MILD: No SOB at rest, mild SOB with walking, speaks normally in sentences, can lie down, no retractions, pulse < 100.    -  MODERATE: SOB at rest, SOB with minimal exertion and prefers to sit, cannot lie down flat, speaks in phrases, mild retractions, audible wheezing, pulse 100-120.    - SEVERE: Very SOB at rest, speaks in single words, struggling to breathe, sitting hunched forward, retractions, pulse > 120      mild 6. FEVER: "Do you have a fever?" If Yes, ask: "What is your temperature, how was it measured, and when did it start?"     No  7. CARDIAC HISTORY: "Do you have any history of heart disease?" (e.g., heart attack, congestive heart failure)      denies 8. LUNG HISTORY: "Do you have any history of lung disease?"  (e.g., pulmonary embolus, asthma, emphysema)     Pneumonia, 10. OTHER SYMPTOMS: "Do you have any other symptoms?" (e.g., runny nose, wheezing, chest pain)       Wheezing, runny nose, CP when I cough 12. TRAVEL: "Have you traveled out of the country in the last month?" (e.g., travel history, exposures)       denies  Protocols used: Cough - Acute Productive-A-AH

## 2023-06-16 NOTE — Progress Notes (Signed)
 Subjective:   Tammy Strickland is a 63 y.o. who presents for a Medicare Wellness preventive visit.  Visit Complete: Virtual I connected with  Tammy Strickland on 06/16/23 by a audio enabled telemedicine application and verified that I am speaking with the correct person using two identifiers.  Patient Location: Home  Provider Location: Home Office  I discussed the limitations of evaluation and management by telemedicine. The patient expressed understanding and agreed to proceed.  Vital Signs: Because this visit was a virtual/telehealth visit, some criteria may be missing or patient reported. Any vitals not documented were not able to be obtained and vitals that have been documented are patient reported.  VideoDeclined- This patient declined Librarian, academic. Therefore the visit was completed with audio only.  AWV Questionnaire: No: Patient Medicare AWV questionnaire was not completed prior to this visit.  Cardiac Risk Factors include: advanced age (>47men, >44 women)     Objective:    Today's Vitals   06/16/23 0842 06/16/23 0852  Weight: 274 lb (124.3 kg)   Height: 5\' 6"  (1.676 m)   PainSc:  10-Worst pain ever   Body mass index is 44.22 kg/m.     06/16/2023    8:59 AM 05/31/2023    8:13 AM 09/19/2022   11:07 PM 06/15/2022    8:53 AM 06/13/2021    8:44 AM 03/17/2021    6:07 AM 11/02/2020   10:40 AM  Advanced Directives  Does Patient Have a Medical Advance Directive? No No No No No No No  Would patient like information on creating a medical advance directive? No - Patient declined   No - Patient declined No - Patient declined No - Patient declined     Current Medications (verified) Outpatient Encounter Medications as of 06/16/2023  Medication Sig   ACCU-CHEK GUIDE TEST test strip TEST TWICE DAILY   Accu-Chek Softclix Lancets lancets ONE EACH BY OTHER ROUTE FOUR TIMES DAILY.   albuterol (PROVENTIL) (2.5 MG/3ML) 0.083% nebulizer solution Take 3 mLs (2.5  mg total) by nebulization every 6 (six) hours as needed for wheezing or shortness of breath.   albuterol (VENTOLIN HFA) 108 (90 Base) MCG/ACT inhaler Inhale 2 puffs into the lungs every 6 (six) hours as needed for wheezing or shortness of breath.   amitriptyline (ELAVIL) 75 MG tablet Take 1 tablet by mouth at bedtime (Patient taking differently: Take 150 mg by mouth at bedtime.)   aspirin EC 81 MG tablet Take 1 tablet (81 mg total) by mouth daily.   atorvastatin (LIPITOR) 20 MG tablet Take 1 tablet by mouth every day   cloNIDine (CATAPRES) 0.3 MG tablet Take 1 tablet by mouth 3 times a day   Continuous Glucose Sensor (DEXCOM G7 SENSOR) MISC Use 1 sensor for continuous glucose monitoring every 10 days 30 days   fluticasone (FLONASE) 50 MCG/ACT nasal spray Shake liquid & use 1 spray into each nostril every day as needed for allergies   furosemide (LASIX) 80 MG tablet Take 1 tablet by mouth every day   IBU 800 MG tablet Take 1 tablet 3 times a day by oral route as needed for 10 days.   Incontinence Supply Disposable (DEPEND FIT-FLEX-WOMEN-M) MISC Use as directed   Incontinence Supply Disposable (DEPEND UNDERWEAR LARGE) MISC UAD for incontinence   insulin glargine, 2 Unit Dial, (TOUJEO MAX SOLOSTAR) 300 UNIT/ML Solostar Pen Inject 160 Units into the skin every evening. And pen needles 1/day (Patient taking differently: Inject 150 Units into the skin 2 (two) times  daily.)   Insulin Pen Needle (PEN NEEDLES) 32G X 5 MM MISC UAD daily for insulin   KLOR-CON M10 10 MEQ tablet Take 2 tablets by mouth every day (Patient taking differently: Take 10 mEq by mouth 2 (two) times daily.)   losartan (COZAAR) 50 MG tablet Take 1 tablet by mouth twice daily   metoprolol (TOPROL-XL) 200 MG 24 hr tablet Take 1 tablet by mouth twice daily   NARCAN 4 MG/0.1ML LIQD nasal spray kit Place 1 spray into the nose once.   nitroGLYCERIN (NITROSTAT) 0.4 MG SL tablet Place 0.4 mg under the tongue every 5 (five) minutes as needed  for chest pain.   oxyCODONE-acetaminophen (PERCOCET) 10-325 MG tablet Take 1 tablet by mouth every 6 (six) hours as needed. Do Not Fill Before 02/ 02/2024   pantoprazole (PROTONIX) 40 MG tablet Take 1 tablet by mouth twice daily   tirzepatide (MOUNJARO) 5 MG/0.5ML Pen Inject 5 mg into the skin once a week. (Patient taking differently: Inject 5 mg into the skin every Sunday.)   tiZANidine (ZANAFLEX) 4 MG tablet Take 1 tablet by mouth 3 times a day   ZTLIDO 1.8 % PTCH Apply 3 patches topically daily as needed (shoulder/back pain.).   No facility-administered encounter medications on file as of 06/16/2023.    Allergies (verified) Bacitracin-polymyxin b, Metformin and related, Other, Ozempic (0.25 or 0.5 mg-dose) [semaglutide(0.25 or 0.5mg -dos)], Quinolones, and Bacitracin   History: Past Medical History:  Diagnosis Date   Ambulates with cane 03/13/2021   Aortic atherosclerosis (HCC)    Arthritis    Coronary artery disease    Diabetes mellitus without complication (HCC)    type 2    Fibromyalgia    Gallstones    GERD (gastroesophageal reflux disease)    HLD (hyperlipidemia)    Hypertension    Neuromuscular disorder (HCC)    Osteoporosis    Pancreatitis 05/27/2019   none since   Peripheral neuropathy 03/13/2021   all over pt pt   Shingles    TIA (transient ischemic attack)    Uses walker 03/13/2021   Wears glasses    Past Surgical History:  Procedure Laterality Date   ABDOMINAL HYSTERECTOMY  1989   ovaries left   APPENDECTOMY  1989   BREAST LUMPECTOMY WITH RADIOACTIVE SEED LOCALIZATION Left 06/14/2019   Procedure: LEFT BREAST LUMPECTOMY WITH RADIOACTIVE SEED LOCALIZATION X 2;  Surgeon: Abigail Miyamoto, MD;  Location: [facility];  Service: General;  Laterality: Left;  LMA   CHOLECYSTECTOMY  1989   IR GENERIC HISTORICAL  11/07/2015   IR ANGIO INTRA EXTRACRAN SEL COM CAROTID INNOMINATE BILAT MOD SED 11/07/2015 Julieanne Cotton, MD [facility]   IR GENERIC HISTORICAL  11/07/2015    IR ANGIO VERTEBRAL SEL SUBCLAVIAN INNOMINATE UNI R MOD SED 11/07/2015 Julieanne Cotton, MD [facility]   IR GENERIC HISTORICAL  11/07/2015   IR ANGIO VERTEBRAL SEL VERTEBRAL UNI L MOD SED 11/07/2015 Julieanne Cotton, MD [facility]   IR GENERIC HISTORICAL  11/07/2015   IR ANGIOGRAM EXTREMITY LEFT 11/07/2015 Julieanne Cotton, MD [facility]   MASS EXCISION Left 03/17/2021   Procedure: EXCISION MASS,left middle finger ulnar border mass excision,left thumb volar retinacular ganglion cyst excision;  Surgeon: Gomez Cleverly, MD;  Location: [facility];  Service: Orthopedics;  Laterality: Left;   TONSILLECTOMY  2000   adenoids removed   TOTAL KNEE ARTHROPLASTY Left 03/12/2017   Procedure: LEFT TOTAL KNEE ARTHROPLASTY;  Surgeon: Kathryne Hitch, MD;  Location: [facility];  Service: Orthopedics;  Laterality: Left;  Adductor Block   TRIGGER FINGER RELEASE Left 03/17/2021   Procedure: left trigger thumb release;  Surgeon: Gomez Cleverly, MD;  Location: [facility];  Service: Orthopedics;  Laterality: Left;  with local anesthesia   Family History  Problem Relation Age of Onset   Hyperlipidemia Mother    Hypertension Mother    Stroke Mother    Thyroid disease Mother    Heart attack Mother    Hyperlipidemia Father    Hypertension Father    Stroke Father    Heart attack Father    Hypertension Sister    Stroke Sister    Thyroid disease Sister    Breast cancer Sister    Crohn's disease Sister    Hypertension Sister    Hypertension Brother    Diabetes Brother    Hypertension Brother    Social History   Socioeconomic History   Marital status: Single    Spouse name: n/a   Number of children: 3   Years of education: 12+   Highest education level: Not on file  Occupational History   Occupation: disabled-falling, doesn't recall name of toxin    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  Tobacco Use   Smoking status: Every Day    Current  packs/day: 0.50    Average packs/day: 0.5 packs/day for 35.0 years (17.5 ttl pk-yrs)    Types: Cigarettes   Smokeless tobacco: Never   Tobacco comments:    referred  to smoking  cessation  classes at  [employer] Use   Vaping status: Never Used  Substance and Sexual Activity   Alcohol use: Yes    Comment: occasional   Drug use: No   Sexual activity: Not Currently    Partners: Female    Birth control/protection: Surgical    Comment: hysterectomy  Other Topics Concern   Not on file  Social History Narrative   Moved to [place] from [place], [place] February 2017, to help her daughter.   Lives with her daughter.   Sons live in [place] and [place].   She reports that there were originally 17 children in her family (she is the youngest), the oldest are deceased, some prior to her birth, and she isn't sure which were female/female or how they died.      Lives alone, son stays across the street-2025   Social Drivers of Health   Financial Resource Strain: Low Risk  (06/16/2023)   Overall Financial Resource Strain (CARDIA)    Difficulty of Paying Living Expenses: Not very hard  Food Insecurity: No Food Insecurity (06/16/2023)   Hunger Vital Sign    Worried About Running Out of Food in the Last Year: Never true    Ran Out of Food in the Last Year: Never true  Transportation Needs: No Transportation Needs (06/15/2022)   PRAPARE - Administrator, Civil Service (Medical): No    Lack of Transportation (Non-Medical): No  Physical Activity: Inactive (06/16/2023)   Exercise Vital Sign    Days of Exercise per Week: 0 days    Minutes of Exercise per Session: 0 min  Stress: No Stress Concern Present (06/16/2023)   [employer] of Occupational Health - Occupational Stress Questionnaire    Feeling of Stress : Not at all  Social Connections: Socially Isolated (06/16/2023)   Social Connection and Isolation Panel [NHANES]    Frequency of Communication with Friends and Family: Twice a week     Frequency of Social Gatherings with Friends  and Family: Never    Attends Religious Services: 1 to 4 times per year    Active Member of Clubs or Organizations: No    Attends Engineer, structural: Never    Marital Status: Never married    Tobacco Counseling Ready to quit: Not Answered Counseling given: Not Answered Tobacco comments: referred  to smoking  cessation  classes at  [facility]     Clinical Intake:  Pre-visit preparation completed: Yes  Pain : 0-10 Pain Score: 10-Worst pain ever Pain Location: Knee (legs) Pain Orientation:  (both knees) Pain Descriptors / Indicators: Aching, Discomfort Pain Onset: More than a month ago Pain Frequency: Constant     BMI - recorded: 44.22 Nutritional Status: BMI > 30  Obese Diabetes: Yes CBG done?: Yes (113) CBG resulted in Enter/ Edit results?: No Did pt. bring in CBG monitor from home?: No  How often do you need to have someone help you when you read instructions, pamphlets, or other written materials from your doctor or pharmacy?: 1 - Never  Interpreter Needed?: No  Information entered by :: Lateshia Schmoker, RMA   Activities of Daily Living     06/16/2023    8:56 AM 05/31/2023    8:15 AM  In your present state of health, do you have any difficulty performing the following activities:  Hearing? 0   Vision? 0   Difficulty concentrating or making decisions? 0   Walking or climbing stairs? 0   Dressing or bathing? 0   Doing errands, shopping? 0 0  Comment social service transportation   Preparing Food and eating ? N   Using the Toilet? N   In the past six months, have you accidently leaked urine? N   Do you have problems with loss of bowel control? N   Managing your Medications? N   Managing your Finances? N   Housekeeping or managing your Housekeeping? N     Patient Care Team: Pincus Sanes, MD as PCP - General (Internal Medicine) Gaynelle Adu, MD as Consulting Physician (General Surgery) Shea Evans, Genice Rouge  [facility])  Indicate any recent Medical Services you may have received from other than [facility] providers in the past year (date may be approximate).     Assessment:   This is a routine wellness examination for Serrita.  Hearing/Vision screen Hearing Screening - Comments:: Denies hearing difficulties   Vision Screening - Comments:: Wears eyeglasses   Goals Addressed   None    Depression Screen     06/16/2023    9:02 AM 11/25/2022    9:28 AM 09/24/2022   10:50 AM 08/26/2022    9:29 AM 07/29/2022   10:54 AM 06/30/2022    8:37 AM 06/15/2022    8:55 AM  PHQ 2/9 Scores  PHQ - 2 Score 0 0 0 0 2 0 0  PHQ- 9 Score 2  4  7       Fall Risk     06/16/2023    8:59 AM 04/27/2023    8:46 AM 11/25/2022    9:28 AM 09/24/2022   10:50 AM 08/26/2022    9:28 AM  Fall Risk   Falls in the past year? 0 1 0 0 1  Comment  LAST FALL TODAY 04/27/2023. Fell on ice at home pain in left buttock.     Number falls in past yr: 0  1 0 1  Comment     no recent fall  Injury with Fall? 0  0 0 0  Risk for  fall due to : No Fall Risks   No Fall Risks History of fall(s);Impaired balance/gait;Orthopedic patient  Follow up Falls prevention discussed;Falls evaluation completed   Falls evaluation completed     MEDICARE RISK AT HOME:  Medicare Risk at Home Any stairs in or around the home?: No Home free of loose throw rugs in walkways, pet beds, electrical cords, etc?: Yes Adequate lighting in your home to reduce risk of falls?: Yes Life alert?: No Use of a cane, walker or w/c?: Yes (cane and walker) Grab bars in the bathroom?: Yes Shower chair or bench in shower?: No Elevated toilet seat or a handicapped toilet?: No  TIMED UP AND GO:  Was the test performed?  No  Cognitive Function: 6CIT completed        06/16/2023    9:00 AM 06/15/2022    8:55 AM  6CIT Screen  What Year? 0 points 0 points  What month? 0 points 0 points  What time? 0 points 0 points  Count back from 20 0 points 0 points  Months in reverse 0  points 0 points  Repeat phrase 0 points 0 points  Total Score 0 points 0 points    Immunizations Immunization History  Administered Date(s) Administered   Influenza, Seasonal, Injecte, Preservative Fre 04/23/2023   Influenza,inj,Quad PF,6+ Mos 01/15/2016, 12/25/2016, 01/24/2018, 01/07/2021   Influenza-Unspecified 01/15/2016, 12/25/2016, 01/24/2018   Pneumococcal Polysaccharide-23 04/16/2016   Tdap 01/11/2018    Screening Tests Health Maintenance  Topic Date Due   DEXA SCAN  Never done   Zoster Vaccines- Shingrix (1 of 2) Never done   Pneumococcal Vaccine 45-62 Years old (2 of 2 - PCV) 04/16/2017   OPHTHALMOLOGY EXAM  05/27/2019   FOOT EXAM  10/28/2022   COVID-19 Vaccine (1 - 2024-25 season) Never done   Medicare Annual Wellness (AWV)  06/15/2023   HEMOGLOBIN A1C  12/02/2023   MAMMOGRAM  12/26/2023   Colonoscopy  02/23/2024   Diabetic kidney evaluation - Urine ACR  04/22/2024   Diabetic kidney evaluation - eGFR measurement  06/03/2024   DTaP/Tdap/Td (2 - Td or Tdap) 01/12/2028   INFLUENZA VACCINE  Completed   Hepatitis C Screening  Completed   HIV Screening  Completed   HPV VACCINES  Aged Out    Health Maintenance  Health Maintenance Due  Topic Date Due   DEXA SCAN  Never done   Zoster Vaccines- Shingrix (1 of 2) Never done   Pneumococcal Vaccine 40-55 Years old (2 of 2 - PCV) 04/16/2017   OPHTHALMOLOGY EXAM  05/27/2019   FOOT EXAM  10/28/2022   COVID-19 Vaccine (1 - 2024-25 season) Never done   Medicare Annual Wellness (AWV)  06/15/2023   Health Maintenance Items Addressed: See Nurse Notes  Additional Screening:  Vision Screening: Recommended annual ophthalmology exams for early detection of glaucoma and other disorders of the eye.  Dental Screening: Recommended annual dental exams for proper oral hygiene  Community Resource Referral / Chronic Care Management: CRR required this visit?  No   CCM required this visit?  No     Plan:     I have  personally reviewed and noted the following in the patient's chart:   Medical and social history Use of alcohol, tobacco or illicit drugs  Current medications and supplements including opioid prescriptions. Patient is currently taking opioid prescriptions. Information provided to patient regarding non-opioid alternatives. Patient advised to discuss non-opioid treatment plan with their provider. Functional ability and status Nutritional status Physical activity Advanced directives List  of other physicians Hospitalizations, surgeries, and ER visits in previous 12 months Vitals Screenings to include cognitive, depression, and falls Referrals and appointments  In addition, I have reviewed and discussed with patient certain preventive protocols, quality metrics, and best practice recommendations. A written personalized care plan for preventive services as well as general preventive health recommendations were provided to patient.     Cortlandt Capuano L Hilary Milks, CMA   06/16/2023   After Visit Summary: (MyChart) Due to this being a telephonic visit, the after visit summary with patients personalized plan was offered to patient via MyChart   Notes: Please refer to Routing Comments.

## 2023-06-16 NOTE — Patient Instructions (Signed)
 Ms. Handley , Thank you for taking time to come for your Medicare Wellness Visit. I appreciate your ongoing commitment to your health goals. Please review the following plan we discussed and let me know if I can assist you in the future.   Referrals/Orders/Follow-Ups/Clinician Recommendations: It was nice talking to you today.  You are due for a Shingles vaccine and a 2nd Pneumonia vaccine.  You are due for a foot examination as well.  Each day, aim for 6 glasses of water, plenty of protein in your diet and try to get up and walk/ stretch every hour for 5-10 minutes at a time.    This is a list of the screening recommended for you and due dates:  Health Maintenance  Topic Date Due   DEXA scan (bone density measurement)  Never done   Zoster (Shingles) Vaccine (1 of 2) Never done   Pneumococcal Vaccination (2 of 2 - PCV) 04/16/2017   Eye exam for diabetics  05/27/2019   Complete foot exam   10/28/2022   COVID-19 Vaccine (1 - 2024-25 season) Never done   Medicare Annual Wellness Visit  06/15/2023   Hemoglobin A1C  12/02/2023   Mammogram  12/26/2023   Colon Cancer Screening  02/23/2024   Yearly kidney health urinalysis for diabetes  04/22/2024   Yearly kidney function blood test for diabetes  06/03/2024   DTaP/Tdap/Td vaccine (2 - Td or Tdap) 01/12/2028   Flu Shot  Completed   Hepatitis C Screening  Completed   HIV Screening  Completed   HPV Vaccine  Aged Out    Advanced directives: (Declined) Advance directive discussed with you today. Even though you declined this today, please call our office should you change your mind, and we can give you the proper paperwork for you to fill out.  Next Medicare Annual Wellness Visit scheduled for next year: Yes

## 2023-06-17 MED ORDER — NITROGLYCERIN 0.4 MG SL SUBL: 0.4000 mg | SUBLINGUAL_TABLET | SUBLINGUAL | 2 refills | Status: DC | PRN

## 2023-06-17 NOTE — Telephone Encounter (Signed)
 Ntg rx sent to pharmacy

## 2023-06-17 NOTE — Telephone Encounter (Signed)
 I do not see that you have filled this before, ok to fill?

## 2023-06-21 ENCOUNTER — Encounter: Payer: Self-pay | Admitting: Registered Nurse

## 2023-06-21 ENCOUNTER — Encounter: Payer: 59 | Attending: Registered Nurse | Admitting: Registered Nurse

## 2023-06-21 ENCOUNTER — Ambulatory Visit: Admitting: Emergency Medicine

## 2023-06-21 VITALS — BP 139/84 | HR 71 | Ht 66.0 in | Wt 284.0 lb

## 2023-06-21 DIAGNOSIS — G8929 Other chronic pain: Secondary | ICD-10-CM

## 2023-06-21 DIAGNOSIS — M1712 Unilateral primary osteoarthritis, left knee: Secondary | ICD-10-CM | POA: Diagnosis not present

## 2023-06-21 DIAGNOSIS — M25511 Pain in right shoulder: Secondary | ICD-10-CM | POA: Diagnosis not present

## 2023-06-21 DIAGNOSIS — Z6841 Body Mass Index (BMI) 40.0 and over, adult: Secondary | ICD-10-CM | POA: Diagnosis not present

## 2023-06-21 DIAGNOSIS — G894 Chronic pain syndrome: Secondary | ICD-10-CM | POA: Diagnosis not present

## 2023-06-21 DIAGNOSIS — M545 Low back pain, unspecified: Secondary | ICD-10-CM | POA: Insufficient documentation

## 2023-06-21 DIAGNOSIS — M2550 Pain in unspecified joint: Secondary | ICD-10-CM | POA: Diagnosis not present

## 2023-06-21 DIAGNOSIS — Z79891 Long term (current) use of opiate analgesic: Secondary | ICD-10-CM

## 2023-06-21 DIAGNOSIS — Z5181 Encounter for therapeutic drug level monitoring: Secondary | ICD-10-CM

## 2023-06-21 DIAGNOSIS — Y92009 Unspecified place in unspecified non-institutional (private) residence as the place of occurrence of the external cause: Secondary | ICD-10-CM | POA: Diagnosis not present

## 2023-06-21 DIAGNOSIS — M1711 Unilateral primary osteoarthritis, right knee: Secondary | ICD-10-CM

## 2023-06-21 DIAGNOSIS — W010XXA Fall on same level from slipping, tripping and stumbling without subsequent striking against object, initial encounter: Secondary | ICD-10-CM | POA: Insufficient documentation

## 2023-06-21 DIAGNOSIS — Z760 Encounter for issue of repeat prescription: Secondary | ICD-10-CM | POA: Diagnosis not present

## 2023-06-21 DIAGNOSIS — W19XXXD Unspecified fall, subsequent encounter: Secondary | ICD-10-CM | POA: Diagnosis not present

## 2023-06-21 DIAGNOSIS — F17210 Nicotine dependence, cigarettes, uncomplicated: Secondary | ICD-10-CM | POA: Insufficient documentation

## 2023-06-21 DIAGNOSIS — Z96652 Presence of left artificial knee joint: Secondary | ICD-10-CM | POA: Diagnosis not present

## 2023-06-21 DIAGNOSIS — M170 Bilateral primary osteoarthritis of knee: Secondary | ICD-10-CM | POA: Insufficient documentation

## 2023-06-21 DIAGNOSIS — R202 Paresthesia of skin: Secondary | ICD-10-CM | POA: Diagnosis not present

## 2023-06-21 DIAGNOSIS — M25512 Pain in left shoulder: Secondary | ICD-10-CM | POA: Insufficient documentation

## 2023-06-21 MED ORDER — OXYCODONE-ACETAMINOPHEN 10-325 MG PO TABS: 1.0000 | ORAL_TABLET | Freq: Four times a day (QID) | ORAL | 0 refills | Status: DC | PRN

## 2023-06-21 NOTE — Progress Notes (Signed)
 Subjective:    Patient ID: Tammy Strickland, female    DOB: Dec 14, 1960, 63 y.o.   MRN: [id number]  HPI: Tammy Strickland is a 63 y.o. female who returns for follow up appointment for chronic pain and medication refill. She states her left shoulder, lower back and bilateral knee pain. She also reports generalized joint pain. She rates  her pain 8.Her  current exercise regime is walking and performing stretching exercises.  She also reports she was shampooing her carpet, she lost her footing and landed on her left knee. Her son picked her up, she didn;t seek medical attention. Educated on falls prevention. She verbalizes understanding.   Tammy Strickland Morphine equivalent is 60.00 MME.   Oral Swab was Performed today.     Pain Inventory Average Pain 10 Pain Right Now 8 My pain is constant, sharp, burning, dull, stabbing, tingling, and aching  In the last 24 hours, has pain interfered with the following? General activity 10 Relation with others 10 Enjoyment of life 10 What TIME of day is your pain at its worst? morning , daytime, evening, and night Sleep (in general) Poor  Pain is worse with: some activites Pain improves with: rest and medication Relief from Meds: 6  Family History  Problem Relation Age of Onset   Hyperlipidemia Mother    Hypertension Mother    Stroke Mother    Thyroid disease Mother    Heart attack Mother    Hyperlipidemia Father    Hypertension Father    Stroke Father    Heart attack Father    Hypertension Sister    Stroke Sister    Thyroid disease Sister    Breast cancer Sister    Crohn's disease Sister    Hypertension Sister    Hypertension Brother    Diabetes Brother    Hypertension Brother    Social History   Socioeconomic History   Marital status: Single    Spouse name: n/a   Number of children: 3   Years of education: 12+   Highest education level: Not on file  Occupational History   Occupation: disabled-falling, doesn't recall name of toxin     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  Tobacco Use   Smoking status: Every Day    Current packs/day: 0.50    Average packs/day: 0.5 packs/day for 35.0 years (17.5 ttl pk-yrs)    Types: Cigarettes   Smokeless tobacco: Never   Tobacco comments:    referred  to smoking  cessation  classes at  [employer] Use   Vaping status: Never Used  Substance and Sexual Activity   Alcohol use: Yes    Comment: occasional   Drug use: No   Sexual activity: Not Currently    Partners: Female    Birth control/protection: Surgical    Comment: hysterectomy  Other Topics Concern   Not on file  Social History Narrative   Moved to [place] from [place], [place] February 2017, to help her daughter.   Lives with her daughter.   Sons live in [place] and [place].   She reports that there were originally 17 children in her family (she is the youngest), the oldest are deceased, some prior to her birth, and she isn't sure which were female/female or how they died.      Lives alone, son stays across the street-2025   Social Drivers of Health   Financial Resource Strain: Low Risk  (06/16/2023)   Overall Financial Resource Strain (CARDIA)    Difficulty  of Paying Living Expenses: Not very hard  Food Insecurity: No Food Insecurity (06/16/2023)   Hunger Vital Sign    Worried About Running Out of Food in the Last Year: Never true    Ran Out of Food in the Last Year: Never true  Transportation Needs: No Transportation Needs (06/15/2022)   PRAPARE - Administrator, Civil Service (Medical): No    Lack of Transportation (Non-Medical): No  Physical Activity: Inactive (06/16/2023)   Exercise Vital Sign    Days of Exercise per Week: 0 days    Minutes of Exercise per Session: 0 min  Stress: No Stress Concern Present (06/16/2023)   [employer] of Occupational Health - Occupational Stress Questionnaire    Feeling of Stress : Not at all  Social Connections: Socially Isolated (06/16/2023)    Social Connection and Isolation Panel [NHANES]    Frequency of Communication with Friends and Family: Twice a week    Frequency of Social Gatherings with Friends and Family: Never    Attends Religious Services: 1 to 4 times per year    Active Member of [employer] or Organizations: No    Attends Engineer, structural: Never    Marital Status: Never married   Past Surgical History:  Procedure Laterality Date   ABDOMINAL HYSTERECTOMY  1989   ovaries left   APPENDECTOMY  1989   BREAST LUMPECTOMY WITH RADIOACTIVE SEED LOCALIZATION Left 06/14/2019   Procedure: LEFT BREAST LUMPECTOMY WITH RADIOACTIVE SEED LOCALIZATION X 2;  Surgeon: Abigail Miyamoto, MD;  Location: [facility];  Service: General;  Laterality: Left;  LMA   CHOLECYSTECTOMY  1989   IR GENERIC HISTORICAL  11/07/2015   IR ANGIO INTRA EXTRACRAN SEL COM CAROTID INNOMINATE BILAT MOD SED 11/07/2015 Julieanne Cotton, MD [facility]   IR GENERIC HISTORICAL  11/07/2015   IR ANGIO VERTEBRAL SEL SUBCLAVIAN INNOMINATE UNI R MOD SED 11/07/2015 Julieanne Cotton, MD [facility]   IR GENERIC HISTORICAL  11/07/2015   IR ANGIO VERTEBRAL SEL VERTEBRAL UNI L MOD SED 11/07/2015 Julieanne Cotton, MD [facility]   IR GENERIC HISTORICAL  11/07/2015   IR ANGIOGRAM EXTREMITY LEFT 11/07/2015 Julieanne Cotton, MD [facility]   MASS EXCISION Left 03/17/2021   Procedure: EXCISION MASS,left middle finger ulnar border mass excision,left thumb volar retinacular ganglion cyst excision;  Surgeon: Gomez Cleverly, MD;  Location: [facility];  Service: Orthopedics;  Laterality: Left;   TONSILLECTOMY  2000   adenoids removed   TOTAL KNEE ARTHROPLASTY Left 03/12/2017   Procedure: LEFT TOTAL KNEE ARTHROPLASTY;  Surgeon: Kathryne Hitch, MD;  Location: [facility];  Service: Orthopedics;  Laterality: Left;  Adductor Block   TRIGGER FINGER RELEASE Left 03/17/2021   Procedure: left trigger thumb release;  Surgeon: Gomez Cleverly, MD;  Location: [facility];  Service: Orthopedics;  Laterality: Left;  with local anesthesia   Past Surgical History:  Procedure Laterality Date   ABDOMINAL HYSTERECTOMY  1989   ovaries left   APPENDECTOMY  1989   BREAST LUMPECTOMY WITH RADIOACTIVE SEED LOCALIZATION Left 06/14/2019   Procedure: LEFT BREAST LUMPECTOMY WITH RADIOACTIVE SEED LOCALIZATION X 2;  Surgeon: Abigail Miyamoto, MD;  Location: [facility];  Service: General;  Laterality: Left;  LMA   CHOLECYSTECTOMY  1989   IR GENERIC HISTORICAL  11/07/2015   IR ANGIO INTRA EXTRACRAN SEL COM CAROTID INNOMINATE BILAT MOD SED 11/07/2015 Julieanne Cotton, MD [facility]   IR GENERIC HISTORICAL  11/07/2015   IR ANGIO VERTEBRAL SEL SUBCLAVIAN INNOMINATE  UNI R MOD SED 11/07/2015 Julieanne Cotton, MD [facility]   IR GENERIC HISTORICAL  11/07/2015   IR ANGIO VERTEBRAL SEL VERTEBRAL UNI L MOD SED 11/07/2015 Julieanne Cotton, MD [facility]   IR GENERIC HISTORICAL  11/07/2015   IR ANGIOGRAM EXTREMITY LEFT 11/07/2015 Julieanne Cotton, MD [facility]   MASS EXCISION Left 03/17/2021   Procedure: EXCISION MASS,left middle finger ulnar border mass excision,left thumb volar retinacular ganglion cyst excision;  Surgeon: Gomez Cleverly, MD;  Location: [facility];  Service: Orthopedics;  Laterality: Left;   TONSILLECTOMY  2000   adenoids removed   TOTAL KNEE ARTHROPLASTY Left 03/12/2017   Procedure: LEFT TOTAL KNEE ARTHROPLASTY;  Surgeon: Kathryne Hitch, MD;  Location: [facility];  Service: Orthopedics;  Laterality: Left;  Adductor Block   TRIGGER FINGER RELEASE Left 03/17/2021   Procedure: left trigger thumb release;  Surgeon: Gomez Cleverly, MD;  Location: [facility];  Service: Orthopedics;  Laterality: Left;  with local anesthesia   Past Medical History:  Diagnosis Date   Ambulates with cane 03/13/2021   Aortic atherosclerosis (HCC)    Arthritis    Coronary artery disease    Diabetes mellitus without complication  (HCC)    type 2    Fibromyalgia    Gallstones    GERD (gastroesophageal reflux disease)    HLD (hyperlipidemia)    Hypertension    Neuromuscular disorder (HCC)    Osteoporosis    Pancreatitis 05/27/2019   none since   Peripheral neuropathy 03/13/2021   all over pt pt   Shingles    TIA (transient ischemic attack)    Uses walker 03/13/2021   Wears glasses    BP 139/84   Pulse 71   Ht 5\' 6"  (1.676 m)   Wt 284 lb (128.8 kg)   SpO2 96%   BMI 45.84 kg/m   Opioid Risk Score:   Fall Risk Score:  `1  Depression screen [facility] 2/9     06/21/2023    8:28 AM 06/16/2023    9:02 AM 11/25/2022    9:28 AM 09/24/2022   10:50 AM 08/26/2022    9:29 AM 07/29/2022   10:54 AM 06/30/2022    8:37 AM  Depression screen PHQ 2/9  Decreased Interest 3 0 0 0 0 2 0  Down, Depressed, Hopeless 3 0 0 0 0 0 0  PHQ - 2 Score 6 0 0 0 0 2 0  Altered sleeping  2  2  3    Tired, decreased energy  0  2  1   Change in appetite  0  0  1   Feeling bad or failure about yourself   0  0  0   Trouble concentrating  0  0  0   Moving slowly or fidgety/restless  0  0  0   Suicidal thoughts  0  0  0   PHQ-9 Score  2  4  7    Difficult doing work/chores  Not difficult at all  Not difficult at all        Review of Systems  All other systems reviewed and are negative.      Objective:   Physical Exam Vitals and nursing note reviewed.  Constitutional:      Appearance: Normal appearance.  Cardiovascular:     Rate and Rhythm: Normal rate and regular rhythm.     Pulses: Normal pulses.     Heart sounds: Normal heart sounds.  Pulmonary:  Effort: Pulmonary effort is normal.     Breath sounds: Normal breath sounds.  Musculoskeletal:     Comments: Normal Muscle Bulk and Muscle Testing Reveals:  Upper Extremities: Right: Full ROM and Muscle Strength 5/5  Left Upper Extremity: Decreased ROM 30 Degrees and Muscle Strength 5/5 Bilateral AC Joint Tenderness  Lumbar Paraspinal Tenderness: L-4-L-5 Lower Extremities:  Decreased ROM and Muscle Strength 5/5 Bilateral Lower Extremities Flexion Produces Pain into her Bilateral Patella's Arises from Table slowly using cane for support  Antalgic  Gait     Skin:    General: Skin is warm and dry.     Coloration: Skin is not pale.  Neurological:     Mental Status: She is alert and oriented to person, place, and time.  Psychiatric:        Mood and Affect: Mood normal.        Behavior: Behavior normal.         Assessment & Plan:   Chronic Left Shoulder Pain: Ortho Following. Continue HEP as Tolerated. Continue to Monitor. 06/21/2023 Chronic Bilateral Low Back Pain without sciatica: Continue HEP as Tolerated. Continue current medication regimen. Continue to monitor. 06/21/2023 Bilateral Knee with Primary Osteoarthritis: Ortho Following. Continue current medication regimen. Continue to Monitor. 06/21/2023 Polyarthralgia: Continue HEP as Tolerated. Continue to monitor. 06/21/2023 Chronic Pain Syndrome: Refilled: Oxycodone 10/325mg  one tablet every 4 hours as needed for pain #120. Second script sent for the following month. We will continue the opioid monitoring program, this consists of regular clinic visits, examinations, urine drug screen, pill counts as well as use of [place] Controlled Substance Reporting system. A 12 month History has been reviewed on the [place] Controlled Substance Reporting System on 06/21/2023 Fall on steps: Educated on [employer]. She verbalizes understanding.    F/U in 2 months

## 2023-06-25 DIAGNOSIS — E1165 Type 2 diabetes mellitus with hyperglycemia: Secondary | ICD-10-CM | POA: Diagnosis not present

## 2023-06-27 LAB — DRUG TOX ALC METAB W/CON, ORAL FLD: Alcohol Metabolite: NEGATIVE ng/mL (ref <25)

## 2023-06-30 LAB — DRUG TOX MONITOR 1 W/CONF, ORAL FLD
Amphetamines: NEGATIVE ng/mL (ref <10)
Barbiturates: NEGATIVE ng/mL (ref <10)
Benzodiazepines: NEGATIVE ng/mL (ref <0.50)
Buprenorphine: NEGATIVE ng/mL (ref <0.10)
Cocaine: NEGATIVE ng/mL (ref <5.0)
Codeine: NEGATIVE ng/mL (ref <2.5)
Cotinine: NEGATIVE ng/mL (ref <5.0)
Dihydrocodeine: NEGATIVE ng/mL (ref <2.5)
Fentanyl: NEGATIVE ng/mL (ref <0.10)
Heroin Metabolite: NEGATIVE ng/mL (ref <1.0)
Hydrocodone: NEGATIVE ng/mL (ref <2.5)
Hydromorphone: NEGATIVE ng/mL (ref <2.5)
MARIJUANA: NEGATIVE ng/mL (ref <2.5)
MDMA: NEGATIVE ng/mL (ref <10)
Meprobamate: NEGATIVE ng/mL (ref <2.5)
Methadone: NEGATIVE ng/mL (ref <5.0)
Morphine: NEGATIVE ng/mL (ref <2.5)
Nicotine Metabolite: NEGATIVE ng/mL (ref <5.0)
Norhydrocodone: NEGATIVE ng/mL (ref <2.5)
Noroxycodone: 2.7 ng/mL — ABNORMAL HIGH (ref <2.5)
Opiates: POSITIVE ng/mL — AB (ref <2.5)
Oxycodone: 38 ng/mL — ABNORMAL HIGH (ref <2.5)
Oxymorphone: NEGATIVE ng/mL (ref <2.5)
Phencyclidine: NEGATIVE ng/mL (ref <10)
THC: NEGATIVE ng/mL (ref <2.5)
Tapentadol: NEGATIVE ng/mL (ref <5.0)
Tramadol: NEGATIVE ng/mL (ref <5.0)
Zolpidem: NEGATIVE ng/mL (ref <5.0)

## 2023-07-05 ENCOUNTER — Other Ambulatory Visit: Payer: Self-pay | Admitting: Internal Medicine

## 2023-07-06 DIAGNOSIS — E1165 Type 2 diabetes mellitus with hyperglycemia: Secondary | ICD-10-CM | POA: Diagnosis not present

## 2023-07-08 ENCOUNTER — Encounter: Payer: Self-pay | Admitting: Internal Medicine

## 2023-07-08 NOTE — Progress Notes (Unsigned)
 Subjective:    Patient ID: Tammy Strickland, female    DOB: 01-01-1961, 63 y.o.   MRN: [id number]      HPI Tammy Strickland is here for No chief complaint on file.    anemia    Medications and allergies reviewed with patient and updated if appropriate.  Current Outpatient Medications on File Prior to Visit  Medication Sig Dispense Refill   ACCU-CHEK GUIDE TEST test strip TEST TWICE DAILY 200 strip 2   Accu-Chek Softclix Lancets lancets ONE EACH BY OTHER ROUTE FOUR TIMES DAILY. 100 each 11   albuterol (PROVENTIL) (2.5 MG/3ML) 0.083% nebulizer solution Take 3 mLs (2.5 mg total) by nebulization every 6 (six) hours as needed for wheezing or shortness of breath. 150 mL 1   albuterol (VENTOLIN HFA) 108 (90 Base) MCG/ACT inhaler Inhale 2 puffs into the lungs every 6 (six) hours as needed for wheezing or shortness of breath. 8 g 8   amitriptyline (ELAVIL) 75 MG tablet Take 1 tablet by mouth at bedtime 30 tablet 5   aspirin EC 81 MG tablet Take 1 tablet (81 mg total) by mouth daily.     atorvastatin (LIPITOR) 20 MG tablet Take 1 tablet by mouth every day 30 tablet 11   cloNIDine (CATAPRES) 0.3 MG tablet Take 1 tablet by mouth 3 times a day 90 tablet 11   Continuous Glucose Sensor (DEXCOM G7 SENSOR) MISC Use 1 sensor for continuous glucose monitoring every 10 days 30 days 3 each 5   fluticasone (FLONASE) 50 MCG/ACT nasal spray Shake liquid & use 1 spray into each nostril every day as needed for allergies 16 g 11   furosemide (LASIX) 80 MG tablet Take 1 tablet by mouth every day 30 tablet 11   IBU 800 MG tablet Take 1 tablet 3 times a day by oral route as needed for 10 days.     Incontinence Supply Disposable (DEPEND FIT-FLEX-WOMEN-M) MISC Use as directed     Incontinence Supply Disposable (DEPEND UNDERWEAR LARGE) MISC UAD for incontinence 90 each 11   insulin glargine, 2 Unit Dial, (TOUJEO MAX SOLOSTAR) 300 UNIT/ML Solostar Pen Inject 160 Units into the skin every evening. And pen needles 1/day  (Patient taking differently: Inject 150 Units into the skin 2 (two) times daily.) 45 mL 3   Insulin Pen Needle (PEN NEEDLES) 32G X 5 MM MISC UAD daily for insulin 90 each 3   KLOR-CON M10 10 MEQ tablet Take 2 tablets by mouth every day (Patient taking differently: Take 10 mEq by mouth 2 (two) times daily.) 60 tablet 11   losartan (COZAAR) 50 MG tablet Take 1 tablet by mouth twice daily 60 tablet 11   metoprolol (TOPROL-XL) 200 MG 24 hr tablet Take 1 tablet by mouth twice daily 60 tablet 11   NARCAN 4 MG/0.1ML LIQD nasal spray kit Place 1 spray into the nose once.     nitroGLYCERIN (NITROSTAT) 0.4 MG SL tablet Place 1 tablet (0.4 mg total) under the tongue every 5 (five) minutes as needed for chest pain. 25 tablet 2   oxyCODONE-acetaminophen (PERCOCET) 10-325 MG tablet Take 1 tablet by mouth every 6 (six) hours as needed. Do Not Fill Before 07/23/2023 120 tablet 0   pantoprazole (PROTONIX) 40 MG tablet Take 1 tablet by mouth twice daily 60 tablet 11   tirzepatide (MOUNJARO) 5 MG/0.5ML Pen Inject 5 mg into the skin once a week. (Patient taking differently: Inject 5 mg into the skin every Sunday.) 2 mL 5   tiZANidine (  ZANAFLEX) 4 MG tablet Take 1 tablet by mouth 3 times a day 90 tablet 3   ZTLIDO 1.8 % PTCH Apply 3 patches topically daily as needed (shoulder/back pain.).     No current facility-administered medications on file prior to visit.    Review of Systems     Objective:  There were no vitals filed for this visit. BP Readings from Last 3 Encounters:  06/21/23 139/84  06/04/23 132/78  04/27/23 115/77   Wt Readings from Last 3 Encounters:  06/21/23 284 lb (128.8 kg)  06/16/23 274 lb (124.3 kg)  06/04/23 274 lb 6 oz (124.5 kg)   There is no height or weight on file to calculate BMI.    Physical Exam         Assessment & Plan:    See Problem List for Assessment and Plan of chronic medical problems.

## 2023-07-09 ENCOUNTER — Encounter: Payer: Self-pay | Admitting: Internal Medicine

## 2023-07-09 ENCOUNTER — Ambulatory Visit: Admitting: Internal Medicine

## 2023-07-09 VITALS — BP 101/72 | HR 71 | Temp 98.2°F | Ht 66.0 in | Wt 280.8 lb

## 2023-07-09 DIAGNOSIS — R059 Cough, unspecified: Secondary | ICD-10-CM

## 2023-07-09 DIAGNOSIS — N611 Abscess of the breast and nipple: Secondary | ICD-10-CM | POA: Insufficient documentation

## 2023-07-09 DIAGNOSIS — I10 Essential (primary) hypertension: Secondary | ICD-10-CM

## 2023-07-09 DIAGNOSIS — J069 Acute upper respiratory infection, unspecified: Secondary | ICD-10-CM | POA: Diagnosis not present

## 2023-07-09 DIAGNOSIS — Z20822 Contact with and (suspected) exposure to covid-19: Secondary | ICD-10-CM | POA: Diagnosis not present

## 2023-07-09 DIAGNOSIS — E559 Vitamin D deficiency, unspecified: Secondary | ICD-10-CM | POA: Diagnosis not present

## 2023-07-09 DIAGNOSIS — L02429 Furuncle of limb, unspecified: Secondary | ICD-10-CM

## 2023-07-09 DIAGNOSIS — D649 Anemia, unspecified: Secondary | ICD-10-CM

## 2023-07-09 LAB — CBC WITH DIFFERENTIAL/PLATELET
Basophils Absolute: 0.2 10*3/uL — ABNORMAL HIGH (ref 0.0–0.1)
Basophils Relative: 1.9 % (ref 0.0–3.0)
Eosinophils Absolute: 0.4 10*3/uL (ref 0.0–0.7)
Eosinophils Relative: 4.4 % (ref 0.0–5.0)
HCT: 32.2 % — ABNORMAL LOW (ref 36.0–46.0)
Hemoglobin: 9.9 g/dL — ABNORMAL LOW (ref 12.0–15.0)
Lymphocytes Relative: 29.8 % (ref 12.0–46.0)
Lymphs Abs: 2.6 10*3/uL (ref 0.7–4.0)
MCHC: 30.9 g/dL (ref 30.0–36.0)
MCV: 68.7 fl — ABNORMAL LOW (ref 78.0–100.0)
Monocytes Absolute: 0.8 10*3/uL (ref 0.1–1.0)
Monocytes Relative: 9.2 % (ref 3.0–12.0)
Neutro Abs: 4.8 10*3/uL (ref 1.4–7.7)
Neutrophils Relative %: 54.7 % (ref 43.0–77.0)
Platelets: 370 10*3/uL (ref 150.0–400.0)
RBC: 4.68 Mil/uL (ref 3.87–5.11)
RDW: 18.5 % — ABNORMAL HIGH (ref 11.5–15.5)
WBC: 8.9 10*3/uL (ref 4.0–10.5)

## 2023-07-09 LAB — IBC PANEL
Iron: 15 ug/dL — ABNORMAL LOW (ref 42–145)
Saturation Ratios: 3.5 % — ABNORMAL LOW (ref 20.0–50.0)
TIBC: 434 ug/dL (ref 250.0–450.0)
Transferrin: 310 mg/dL (ref 212.0–360.0)

## 2023-07-09 LAB — FERRITIN: Ferritin: 11 ng/mL (ref 10.0–291.0)

## 2023-07-09 LAB — VITAMIN B12: Vitamin B-12: 632 pg/mL (ref 211–911)

## 2023-07-09 LAB — POC COVID19 BINAXNOW: SARS Coronavirus 2 Ag: NEGATIVE

## 2023-07-09 LAB — VITAMIN D 25 HYDROXY (VIT D DEFICIENCY, FRACTURES): VITD: 20.15 ng/mL — ABNORMAL LOW (ref 30.00–100.00)

## 2023-07-09 MED ORDER — SULFAMETHOXAZOLE-TRIMETHOPRIM 800-160 MG PO TABS: 1.0000 | ORAL_TABLET | Freq: Two times a day (BID) | ORAL | 0 refills | Status: AC

## 2023-07-09 NOTE — Assessment & Plan Note (Signed)
 Acute Hemoglobin 04/23/2023 was 11.2 and 06/04/2023 it was 9.7 Acute anemia No evidence or signs or symptoms consistent with acute blood loss Check CBC, iron levels, B12 level, Hemoccult cards Last colonoscopy was 10 years ago-due later this year-May need to refer to GI depending on blood work results

## 2023-07-09 NOTE — Assessment & Plan Note (Signed)
 Chronic Taking vitamin D daily Check vitamin D level

## 2023-07-09 NOTE — Assessment & Plan Note (Signed)
 Acute Under left breast It has drained and now is minimally tender Will be taking Bactrim DS for her leg boil which will make sure this infection is completely treated

## 2023-07-09 NOTE — Assessment & Plan Note (Signed)
 Chronic Blood pressure controlled Continue losartan 50 mg twice daily, metoprolol XL 200 mg twice daily, clonidine 0.3 mg 3 times daily

## 2023-07-09 NOTE — Patient Instructions (Addendum)
      Blood work was ordered.       Medications changes include :   Bactrim DS x 7 days.   If your symptoms are not better after a week send me a mychart message.    Return for to be determine based on blood work.

## 2023-07-09 NOTE — Assessment & Plan Note (Signed)
 Acute Rapid COVID test here negative-she was exposed to COVID URI-difficulty know if it is bacterial or viral I will be prescribing her Bactrim for her boil which may also help with the URI She will let me know if there is no improvement or any worsening

## 2023-07-09 NOTE — Assessment & Plan Note (Signed)
 Subacute Left proximal, medial leg Has a history of boils-somewhat recurrent Continue warm compresses Bactrim DS twice daily x 7 days

## 2023-07-10 NOTE — Addendum Note (Signed)
 Addended by: Pincus Sanes on: 07/10/2023 09:52 PM   Modules accepted: Orders

## 2023-07-13 DIAGNOSIS — M25512 Pain in left shoulder: Secondary | ICD-10-CM | POA: Diagnosis not present

## 2023-07-14 ENCOUNTER — Telehealth: Payer: Self-pay | Admitting: Internal Medicine

## 2023-07-14 NOTE — Telephone Encounter (Signed)
 Read message below to pt. The pt still has questions about her labs please reach out to pt. Thanks.

## 2023-07-15 NOTE — Telephone Encounter (Signed)
 Message left for patient to return call to clinic to answer questions or concerns she had.

## 2023-07-20 ENCOUNTER — Emergency Department (HOSPITAL_COMMUNITY)

## 2023-07-20 ENCOUNTER — Other Ambulatory Visit: Payer: Self-pay

## 2023-07-20 ENCOUNTER — Emergency Department (HOSPITAL_COMMUNITY)
Admission: EM | Admit: 2023-07-20 | Discharge: 2023-07-20 | Disposition: A | Discharge status: Home / Self Care | Attending: Emergency Medicine | Admitting: Emergency Medicine

## 2023-07-20 ENCOUNTER — Encounter (HOSPITAL_COMMUNITY): Payer: Self-pay | Admitting: Emergency Medicine

## 2023-07-20 DIAGNOSIS — E041 Nontoxic single thyroid nodule: Secondary | ICD-10-CM | POA: Diagnosis not present

## 2023-07-20 DIAGNOSIS — M47812 Spondylosis without myelopathy or radiculopathy, cervical region: Secondary | ICD-10-CM | POA: Diagnosis not present

## 2023-07-20 DIAGNOSIS — K111 Hypertrophy of salivary gland: Secondary | ICD-10-CM | POA: Diagnosis not present

## 2023-07-20 DIAGNOSIS — R220 Localized swelling, mass and lump, head: Secondary | ICD-10-CM | POA: Diagnosis not present

## 2023-07-20 DIAGNOSIS — K1120 Sialoadenitis, unspecified: Secondary | ICD-10-CM | POA: Diagnosis not present

## 2023-07-20 DIAGNOSIS — Z7982 Long term (current) use of aspirin: Secondary | ICD-10-CM | POA: Insufficient documentation

## 2023-07-20 LAB — CBC
HCT: 33.1 % — ABNORMAL LOW (ref 36.0–46.0)
Hemoglobin: 9.7 g/dL — ABNORMAL LOW (ref 12.0–15.0)
MCH: 20.8 pg — ABNORMAL LOW (ref 26.0–34.0)
MCHC: 29.3 g/dL — ABNORMAL LOW (ref 30.0–36.0)
MCV: 71 fL — ABNORMAL LOW (ref 80.0–100.0)
Platelets: 384 10*3/uL (ref 150–400)
RBC: 4.66 MIL/uL (ref 3.87–5.11)
RDW: 18.5 % — ABNORMAL HIGH (ref 11.5–15.5)
WBC: 9.5 10*3/uL (ref 4.0–10.5)
nRBC: 0.2 % (ref 0.0–0.2)

## 2023-07-20 LAB — BASIC METABOLIC PANEL WITH GFR
Anion gap: 10 (ref 5–15)
BUN: 8 mg/dL (ref 8–23)
CO2: 23 mmol/L (ref 22–32)
Calcium: 8.4 mg/dL — ABNORMAL LOW (ref 8.9–10.3)
Chloride: 103 mmol/L (ref 98–111)
Creatinine, Ser: 0.76 mg/dL (ref 0.44–1.00)
GFR, Estimated: >60 mL/min (ref >60)
Glucose, Bld: 201 mg/dL — ABNORMAL HIGH (ref 70–99)
Potassium: 3.4 mmol/L — ABNORMAL LOW (ref 3.5–5.1)
Sodium: 136 mmol/L (ref 135–145)

## 2023-07-20 MED ORDER — IOHEXOL 300 MG/ML  SOLN
75.0000 mL | Freq: Once | INTRAMUSCULAR | Status: AC | PRN
  Administered 2023-07-20: 75 mL via INTRAVENOUS

## 2023-07-20 NOTE — ED Provider Triage Note (Signed)
 Emergency Medicine Provider Triage Evaluation Note  Tammy Strickland , a 63 y.o. female  was evaluated in triage.  Pt complains of left facial swelling.  Review of Systems  Positive: Left sided check swelling x1 day Negative: Shortness of breath, chest pain, dental or oral pain, oral swelling   Physical Exam  BP 137/87 (BP Location: Right Arm)   Pulse 62   Temp 98 F (36.7 C) (Oral)   Resp 18   Ht 5\' 6"  (1.676 m)   Wt 124.3 kg   SpO2 98%   BMI 44.22 kg/m  Gen:   Awake, no distress   Resp:  Normal effort  MSK:   Moves extremities without difficulty  Other:  No uvula swelling, handling secretions, normal phonation. No obvious dental abscess, but overall poor dentition.   Medical Decision Making  Medically screening exam initiated at 2:52 PM.  Appropriate orders placed.  Tammy Strickland was informed that the remainder of the evaluation will be completed by another provider, this initial triage assessment does not replace that evaluation, and the importance of remaining in the ED until their evaluation is complete.     Tammy Knudsen, PA-C 07/20/23 1454

## 2023-07-20 NOTE — Discharge Instructions (Signed)
 You were seen in the emergency department for swelling of your neck The CAT scan showed something called sialoadenitis which is inflammation of a salivary gland There does not appear to be evidence of an infection so you do not need antibiotics As discussed you can apply warm compresses and use sour candies which can help decrease the swelling Follow-up with your primary care doctor in 1 week for reevaluation Return to the emergency department if you have trouble breathing, unable to swallow or any other concerns

## 2023-07-20 NOTE — ED Provider Notes (Signed)
 [place] EMERGENCY DEPARTMENT AT [facility] Provider Note   CSN: [id number] Arrival date & time: 07/20/23  1429     History  Chief Complaint  Patient presents with   Facial Swelling    Antrice Pal is a 63 y.o. female.  With a history of multinodular goiter, carotid cavernous fistula and obstructive sleep apnea presents to the ED for neck swelling.  Patient first experienced swelling over her left anterior neck yesterday which has persisted since the onset.  She notes discomfort with palpation and feels a full sensation with swallowing.  No pain with swallowing and has been able to eat and drink.  No shortness of breath.  She notes she recently recovered from URI symptoms last week.  No fevers chills or recent dental procedures.  HPI     Home Medications Prior to Admission medications   Medication Sig Start Date End Date Taking? Authorizing Provider  ACCU-CHEK GUIDE TEST test strip TEST TWICE DAILY 05/18/23   Pincus Sanes, MD  Accu-Chek Softclix Lancets lancets ONE EACH BY OTHER ROUTE FOUR TIMES DAILY. 05/18/22   Pincus Sanes, MD  albuterol (PROVENTIL) (2.5 MG/3ML) 0.083% nebulizer solution Take 3 mLs (2.5 mg total) by nebulization every 6 (six) hours as needed for wheezing or shortness of breath. 04/23/23   Pincus Sanes, MD  albuterol (VENTOLIN HFA) 108 (90 Base) MCG/ACT inhaler Inhale 2 puffs into the lungs every 6 (six) hours as needed for wheezing or shortness of breath. 10/01/20   Pincus Sanes, MD  amitriptyline (ELAVIL) 75 MG tablet Take 1 tablet by mouth at bedtime 07/05/23   Pincus Sanes, MD  aspirin EC 81 MG tablet Take 1 tablet (81 mg total) by mouth daily. 11/01/15   Elgergawy, Leana Roe, MD  atorvastatin (LIPITOR) 20 MG tablet Take 1 tablet by mouth every day 05/21/23   Pincus Sanes, MD  cloNIDine (CATAPRES) 0.3 MG tablet Take 1 tablet by mouth 3 times a day 03/30/23   Pincus Sanes, MD  Continuous Glucose Sensor (DEXCOM G7 SENSOR) MISC Use 1 sensor for  continuous glucose monitoring every 10 days 30 days 09/28/22   Ocie Cornfield, MD  fluticasone [facility]) 50 MCG/ACT nasal spray Shake liquid & use 1 spray into each nostril every day as needed for allergies 12/15/22   Pincus Sanes, MD  furosemide (LASIX) 80 MG tablet Take 1 tablet by mouth every day 12/15/22   Pincus Sanes, MD  IBU 800 MG tablet Take 1 tablet 3 times a day by oral route as needed for 10 days. 05/12/23   [provider]  Incontinence Supply Disposable (DEPEND FIT-FLEX-WOMEN-M) MISC Use as directed 04/23/23   Pincus Sanes, MD  Incontinence Supply Disposable (DEPEND UNDERWEAR LARGE) MISC UAD for incontinence 04/27/23   Pincus Sanes, MD  insulin glargine, 2 Unit Dial, (TOUJEO MAX SOLOSTAR) 300 UNIT/ML Solostar Pen Inject 160 Units into the skin every evening. And pen needles 1/day Patient taking differently: Inject 150 Units into the skin 2 (two) times daily. 09/24/22   Pincus Sanes, MD  Insulin Pen Needle (PEN NEEDLES) 32G X 5 MM MISC UAD daily for insulin 09/24/22   Burns, Bobette Mo, MD  KLOR-CON M10 10 MEQ tablet Take 2 tablets by mouth every day Patient taking differently: Take 10 mEq by mouth 2 (two) times daily. 12/15/22   Pincus Sanes, MD  losartan (COZAAR) 50 MG tablet Take 1 tablet by mouth twice daily 12/15/22   Burns,  Bobette Mo, MD  metoprolol (TOPROL-XL) 200 MG 24 hr tablet Take 1 tablet by mouth twice daily 12/15/22   Pincus Sanes, MD  [facility] 4 MG/0.1ML LIQD nasal spray kit Place 1 spray into the nose once. 11/17/19   [provider]  nitroGLYCERIN (NITROSTAT) 0.4 MG SL tablet Place 1 tablet (0.4 mg total) under the tongue every 5 (five) minutes as needed for chest pain. 06/17/23   Pincus Sanes, MD  oxyCODONE-acetaminophen (PERCOCET) 10-325 MG tablet Take 1 tablet by mouth every 6 (six) hours as needed. Do Not Fill Before 07/23/2023 06/21/23   Jones Bales, NP  pantoprazole (PROTONIX) 40 MG tablet Take 1 tablet by mouth twice daily 05/31/23   Pincus Sanes, MD   tirzepatide [facility]) 5 MG/0.5ML Pen Inject 5 mg into the skin once a week. Patient taking differently: Inject 5 mg into the skin every Sunday. 09/04/22     tiZANidine (ZANAFLEX) 4 MG tablet Take 1 tablet by mouth 3 times a day 06/02/23   Elijah Birk C, DO  ZTLIDO 1.8 % [facility] Apply 3 patches topically daily as needed (shoulder/back pain.). 06/05/20   [provider]      Allergies    Bacitracin-polymyxin b, Metformin and related, Other, Ozempic (0.25 or 0.5 mg-dose) [semaglutide(0.25 or 0.5mg -dos)], Quinolones, and Bacitracin    Review of Systems   Review of Systems  Physical Exam Updated Vital Signs BP (!) 168/88 (BP Location: Right Arm)   Pulse 68   Temp 98.5 F (36.9 C) (Oral)   Resp 18   Ht 5\' 6"  (1.676 m)   Wt 124.3 kg   SpO2 100%   BMI 44.22 kg/m  Physical Exam Vitals and nursing note reviewed.  HENT:     Head: Normocephalic and atraumatic.     Mouth/Throat:     Comments: No pharyngeal erythema or exudate No swelling of the floor of the mouth Poor dentition Eyes:     Pupils: Pupils are equal, round, and reactive to light.  Neck:     Comments: Left anterior cervical lymphadenopathy tender to palpation  Cardiovascular:     Rate and Rhythm: Normal rate and regular rhythm.  Pulmonary:     Effort: Pulmonary effort is normal.     Breath sounds: Normal breath sounds.  Abdominal:     Palpations: Abdomen is soft.     Tenderness: There is no abdominal tenderness.  Skin:    General: Skin is warm and dry.  Neurological:     Mental Status: She is alert.  Psychiatric:        Mood and Affect: Mood normal.     ED Results / Procedures / Treatments   Labs (all labs ordered are listed, but only abnormal results are displayed) Labs Reviewed  CBC - Abnormal; Notable for the following components:      Result Value   Hemoglobin 9.7 (*)    HCT 33.1 (*)    MCV 71.0 (*)    MCH 20.8 (*)    MCHC 29.3 (*)    RDW 18.5 (*)    All other components within normal  limits  BASIC METABOLIC PANEL WITH GFR - Abnormal; Notable for the following components:   Potassium 3.4 (*)    Glucose, Bld 201 (*)    Calcium 8.4 (*)    All other components within normal limits    EKG None  Radiology CT Soft Tissue Neck W Contrast Result Date: 07/20/2023 CLINICAL DATA:  Initial evaluation for acute soft tissue  infection suspected. EXAM: CT NECK WITH CONTRAST TECHNIQUE: Multidetector CT imaging of the neck was performed using the standard protocol following the bolus administration of intravenous contrast. RADIATION DOSE REDUCTION: This exam was performed according to the departmental dose-optimization program which includes automated exposure control, adjustment of the mA and/or kV according to patient size and/or use of iterative reconstruction technique. CONTRAST:  75mL OMNIPAQUE IOHEXOL 300 MG/ML  SOLN COMPARISON:  Prior ultrasound from 06/18/2021 FINDINGS: Pharynx and larynx: Oral cavity within normal limits. Oropharynx and nasopharynx within normal limits. No retropharyngeal collection or swelling. Negative epiglottis. Hypopharynx and supraglottic larynx within normal limits. Negative glottis. Subglottic airway clear. Salivary glands: Parotid glands within normal limits. Few punctate sialoliths noted within the right submandibular gland without acute sialoadenitis. Contralateral left submandibular gland is mildly enlarged and heterogeneous in appearance. Associated swelling with hazy inflammatory stranding within the adjacent left submandibular space. Findings suspicious for acute sialoadenitis. No obstructive stone or abnormal ductal dilatation. No discrete abscess or drainable fluid collection. Thyroid: Approximate 3 cm right thyroid nodule. This has been previously evaluated by ultrasound and biopsy in 2023. Lymph nodes: No enlarged or pathologic lymph nodes within the neck. Vascular: Normal intravascular enhancement seen within the neck. Scattered vascular calcifications  noted within the carotid siphons. Limited intracranial: Unremarkable. Visualized orbits: Unremarkable. Mastoids and visualized paranasal sinuses: Mild mucoperiosteal thickening present about the left maxillary sinus. Visualized paranasal sinuses are otherwise clear. Visualized mastoids and middle ear cavities are well pneumatized and free of fluid. Skeleton: No worrisome osseous lesions. Mild for age spondylosis within the cervical spine. 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 Upper chest: No other acute finding. Other: None. IMPRESSION: 1. Findings suggestive of acute left submandibular sialoadenitis. No obstructive stone or abnormal ductal dilatation. No discrete abscess or drainable fluid collection. 2. Few punctate sialoliths within the right submandibular gland without acute sialoadenitis. 3. 3 cm right thyroid nodule, previously evaluated by ultrasound and biopsy in 2023. Please refer to these exams regarding any potential follow-up recommendations regarding this finding. 4. 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 Electronically Signed   By: Rise Mu M.D.   On: 07/20/2023 21:07    Procedures Procedures    Medications Ordered in ED Medications  iohexol (OMNIPAQUE) 300 MG/ML solution 75 mL (75 mLs Intravenous Contrast Given 07/20/23 1936)    ED Course/ Medical Decision Making/ A&P Clinical Course as of 07/20/23 2216  Tue Jul 20, 2023  2213 No leukocytosis or significant laboratory abnormalities on lab workup.  CT soft tissue neck shows sialoadenitis without evidence of acute infection.  May be caused by obstructed stone.  Counseled patient on symptomatic management with compresses and using sialagogues such as sour candy.  She will follow-up with her PCP.  She knows what to come back for if there is any concern for dysphagia or airway compromise [MP]    Clinical Course User  Index [MP] Royanne Foots, DO                                 Medical Decision Making 63 year old female with history as above presenting for 2 days of left anterior cervical edema.  Tender to palpation on my exam.  Afebrile no leukocytosis on laboratory workup.  No swelling of the floor the mouth no pharyngeal erythema.  No concern for airway compromise at this time.  Differential diagnosis includes  cervical lymphadenopathy in the setting of recent viral illness last week, multinodular goiter which she has a history of or deep space infection such as epiglottitis, PTA, Ludwig's.            Final Clinical Impression(s) / ED Diagnoses Final diagnoses:  Sialoadenitis    Rx / DC Orders ED Discharge Orders     None         Royanne Foots, DO 07/20/23 2216

## 2023-07-20 NOTE — ED Notes (Signed)
 Provider at bedside to complete MSE.

## 2023-07-20 NOTE — ED Triage Notes (Signed)
 Patient comes in with left sided neck swelling. States can feel a difference in swallowing. Able to eat and swallow.

## 2023-07-21 ENCOUNTER — Telehealth: Payer: Self-pay

## 2023-07-21 NOTE — Transitions of Care (Post Inpatient/ED Visit) (Signed)
 07/21/2023  Name: Tammy Strickland MRN: [id number] DOB: 06-Dec-1960  Today's TOC FU Call Status: Today's TOC FU Call Status:: Successful TOC FU Call Completed TOC FU Call Complete Date: 07/21/23 Patient's Name and Date of Birth confirmed.  Transition Care Management Follow-up Telephone Call Date of Discharge: 07/20/23 Discharge Facility: [facility]) Type of Discharge: Emergency Department Reason for ED Visit: Other: (Sialoadenitis) How have you been since you were released from the hospital?: Same Any questions or concerns?: Yes Patient Questions/Concerns:: (S) Concernend with hemoglobin level Patient Questions/Concerns Addressed: Notified Provider of Patient Questions/Concerns  Items Reviewed: Did you receive and understand the discharge instructions provided?: Yes Medications obtained,verified, and reconciled?: Yes (Medications Reviewed) Any new allergies since your discharge?: No Dietary orders reviewed?: NA Do you have support at home?: No  Medications Reviewed Today: Medications Reviewed Today     Reviewed by Anthoney Harada, LPN (Licensed Practical Nurse) on 07/21/23 at 1531  Med List Status: <None>   Medication Order Taking? Sig Documenting Provider Last Dose Status Informant  ACCU-CHEK GUIDE TEST test strip [id number] Yes TEST TWICE DAILY Pincus Sanes, MD Taking Active Self  Accu-Chek Softclix Lancets lancets [id number] Yes ONE EACH BY OTHER ROUTE FOUR TIMES DAILY. Pincus Sanes, MD Taking Active Self  albuterol (PROVENTIL) (2.5 MG/3ML) 0.083% nebulizer solution [id number] Yes Take 3 mLs (2.5 mg total) by nebulization every 6 (six) hours as needed for wheezing or shortness of breath. Pincus Sanes, MD Taking Active Self  albuterol (VENTOLIN HFA) 108 (90 Base) MCG/ACT inhaler [id number] Yes Inhale 2 puffs into the lungs every 6 (six) hours as needed for wheezing or shortness of breath. Pincus Sanes, MD Taking Active Self  amitriptyline (ELAVIL) 75 MG tablet [id number]  Yes Take 1 tablet by mouth at bedtime Pincus Sanes, MD Taking Active   aspirin EC 81 MG tablet [id number] Yes Take 1 tablet (81 mg total) by mouth daily. Elgergawy, Leana Roe, MD Taking Active Self  atorvastatin (LIPITOR) 20 MG tablet [id number] Yes Take 1 tablet by mouth every day Pincus Sanes, MD Taking Active Self  cloNIDine (CATAPRES) 0.3 MG tablet [id number] Yes Take 1 tablet by mouth 3 times a day Pincus Sanes, MD Taking Active Self  Continuous Glucose Sensor (DEXCOM G7 SENSOR) MISC [id number] Yes Use 1 sensor for continuous glucose monitoring every 10 days 30 days Ocie Cornfield, MD Taking Active Self  fluticasone (FLONASE) 50 MCG/ACT nasal spray [id number] Yes Shake liquid & use 1 spray into each nostril every day as needed for allergies Pincus Sanes, MD Taking Active Self  furosemide (LASIX) 80 MG tablet [id number] Yes Take 1 tablet by mouth every day Pincus Sanes, MD Taking Active Self  IBU 800 MG tablet [id number] Yes Take 1 tablet 3 times a day by oral route as needed for 10 days. [provider] Taking Active Self  Incontinence Supply Disposable (DEPEND FIT-FLEX-WOMEN-M) MISC [id number] Yes Use as directed Pincus Sanes, MD Taking Active Self  Incontinence Supply Disposable (DEPEND UNDERWEAR LARGE) MISC [id number] Yes UAD for incontinence Burns, Bobette Mo, MD Taking Active Self  insulin glargine, 2 Unit Dial, (TOUJEO MAX SOLOSTAR) 300 UNIT/ML Solostar Pen [id number] Yes Inject 160 Units into the skin every evening. And pen needles 1/day  Patient taking differently: Inject 150 Units into the skin 2 (two) times daily.   Pincus Sanes, MD Taking Active Self  Insulin Pen Needle (PEN NEEDLES) 32G X 5 MM MISC [id number] Yes UAD daily for insulin Cheryll Cockayne  J, MD Taking Active Self  KLOR-CON M10 10 MEQ tablet [id number] Yes Take 2 tablets by mouth every day  Patient taking differently: Take 10 mEq by mouth 2 (two) times daily.   Pincus Sanes, MD Taking Active Self  losartan (COZAAR)  50 MG tablet [id number] Yes Take 1 tablet by mouth twice daily Burns, Bobette Mo, MD Taking Active Self  metoprolol (TOPROL-XL) 200 MG 24 hr tablet [id number] Yes Take 1 tablet by mouth twice daily Pincus Sanes, MD Taking Active Self  NARCAN 4 MG/0.1ML LIQD nasal spray kit [id number] Yes Place 1 spray into the nose once. [provider] Taking Active Self  nitroGLYCERIN (NITROSTAT) 0.4 MG SL tablet [id number] Yes Place 1 tablet (0.4 mg total) under the tongue every 5 (five) minutes as needed for chest pain. Pincus Sanes, MD Taking Active   oxyCODONE-acetaminophen (PERCOCET) 10-325 MG tablet [id number] Yes Take 1 tablet by mouth every 6 (six) hours as needed. Do Not Fill Before 07/23/2023 Jones Bales, NP Taking Active   pantoprazole (PROTONIX) 40 MG tablet [id number] Yes Take 1 tablet by mouth twice daily Burns, Bobette Mo, MD Taking Active   tirzepatide [facility]) 5 MG/0.5ML Pen [id number] Yes Inject 5 mg into the skin once a week.  Patient taking differently: Inject 5 mg into the skin every Sunday.    Taking Active Self  tiZANidine (ZANAFLEX) 4 MG tablet [id number] Yes Take 1 tablet by mouth 3 times a day Angelina Sheriff, DO Taking Active   ZTLIDO 1.8 % [facility] [id number] Yes Apply 3 patches topically daily as needed (shoulder/back pain.). [provider] Taking Active Self           Med Note Excell Seltzer, Rosalee Kaufman Sep 01, 2020 10:38 AM)              Home Care and Equipment/Supplies: Were Home Health Services Ordered?: NA Any new equipment or medical supplies ordered?: NA  Functional Questionnaire: Do you need assistance with bathing/showering or dressing?: No Do you need assistance with meal preparation?: No Do you need assistance with eating?: No Do you have difficulty maintaining continence: No Do you need assistance with getting out of bed/getting out of a chair/moving?: No Do you have difficulty managing or taking your medications?: No  Follow up appointments  reviewed: PCP Follow-up appointment confirmed?: Yes Date of PCP follow-up appointment?: 07/27/23 Follow-up Provider: Dr. Lawerance Bach Specialist [facility] Follow-up appointment confirmed?: NA Do you need transportation to your follow-up appointment?: No Do you understand care options if your condition(s) worsen?: Yes-patient verbalized understanding    SIGNATURE Kandis Fantasia, LPN [facility] Health Advisor [place] [facility] You Are. We Are. [facility] Direct Dial [phone]

## 2023-07-25 ENCOUNTER — Encounter: Payer: Self-pay | Admitting: Internal Medicine

## 2023-07-25 NOTE — Progress Notes (Deleted)
 Virtual Visit via Video Note  I connected with Tammy Strickland on 07/25/23 at  8:50 AM EDT by a video enabled telemedicine application and verified that I am speaking with the correct person using two identifiers.   I discussed the limitations of evaluation and management by telemedicine and the availability of in person appointments. The patient expressed understanding and agreed to proceed.  Present for the visit:  Myself, Dr Oma Bias, Tammy Strickland.  The patient is currently at home and I am in the office.    No referring provider.    History of Present Illness: This is a follow up visit from her ED visit.    She went to the ED 4/8 for facial swelling, neck swelling.  She had discomfort with palpation and full sensation.  Ct scan shows  acute left submandibular sialoadenitis.  There was no evidence of acute infection.  She was advised warm compresses and sialagogues ( sour candy).     ? 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  Tobacco Use   Smoking status: Every Day    Current packs/day: 0.50    Average packs/day: 0.5 packs/day for 35.0 years (17.5 ttl pk-yrs)    Types: Cigarettes   Smokeless tobacco: Never   Tobacco comments:    referred  to smoking  cessation  classes at  [employer] Use   Vaping status: Never Used  Substance and Sexual Activity   Alcohol use: Yes    Comment: occasional   Drug use: No   Sexual activity: Not Currently    Partners: Female    Birth control/protection: Surgical    Comment: hysterectomy  Other Topics Concern   Not on file  Social History Narrative   Moved to [place] from [place], [place] February 2017, to help her  daughter.   Lives with her daughter.   Sons live in [place] and [place].   She reports that there were originally 17 children in her family (she is the youngest), the oldest are deceased, some prior to her birth, and she isn't sure which were female/female or how they died.      Lives alone, son stays across the street-2025   Social Drivers of Health   Financial Resource Strain: Low Risk  (06/16/2023)   Overall Financial Resource Strain (CARDIA)    Difficulty of Paying Living Expenses: Not very hard  Food Insecurity: No Food Insecurity (06/16/2023)   Hunger Vital Sign    Worried About Running Out of Food in the Last Year: Never true    Ran Out of Food in the Last Year: Never true  Transportation Needs: No Transportation Needs (06/15/2022)   PRAPARE - Administrator, Civil Service (Medical): No    Lack of Transportation (Non-Medical): No  Physical Activity: Inactive (06/16/2023)   Exercise Vital Sign    Days of Exercise per Week: 0 days    Minutes of Exercise per Session: 0 min  Stress: No Stress Concern Present (06/16/2023)   [employer] of Occupational Health - Occupational Stress Questionnaire    Feeling of Stress : Not at all  Social Connections: Socially Isolated (06/16/2023)  Social Advertising account executive [NHANES]    Frequency of Communication with Friends and Family: Twice a week    Frequency of Social Gatherings with Friends and Family: Never    Attends Religious Services: 1 to 4 times per year    Active Member of [employer] or Organizations: No    Attends Banker Meetings: Never    Marital Status: Never married     Observations/Objective: Appears well in NAD    CT Soft Tissue Neck W Contrast CLINICAL DATA:  Initial evaluation for acute soft tissue infection suspected.  EXAM: CT NECK WITH CONTRAST  TECHNIQUE: Multidetector CT imaging of the neck was performed using the standard protocol following the bolus administration of  intravenous contrast.  RADIATION DOSE REDUCTION: This exam was performed according to the departmental dose-optimization program which includes automated exposure control, adjustment of the mA and/or kV according to patient size and/or use of iterative reconstruction technique.  CONTRAST:  75mL OMNIPAQUE  IOHEXOL  300 MG/ML  SOLN  COMPARISON:  Prior ultrasound from 06/18/2021  FINDINGS: Pharynx and larynx: Oral cavity within normal limits. Oropharynx and nasopharynx within normal limits. No retropharyngeal collection or swelling. Negative epiglottis. Hypopharynx and supraglottic larynx within normal limits. Negative glottis. Subglottic airway clear.  Salivary glands: Parotid glands within normal limits. Few punctate sialoliths noted within the right submandibular gland without acute sialoadenitis. Contralateral left submandibular gland is mildly enlarged and heterogeneous in appearance. Associated swelling with hazy inflammatory stranding within the adjacent left submandibular space. Findings suspicious for acute sialoadenitis. No obstructive stone or abnormal ductal dilatation. No discrete abscess or drainable fluid collection.  Thyroid : Approximate 3 cm right thyroid  nodule. This has been previously evaluated by ultrasound and biopsy in 2023.  Lymph nodes: No enlarged or pathologic lymph nodes within the neck.  Vascular: Normal intravascular enhancement seen within the neck. Scattered vascular calcifications noted within the carotid siphons.  Limited intracranial: Unremarkable.  Visualized orbits: Unremarkable.  Mastoids and visualized paranasal sinuses: Mild mucoperiosteal thickening present about the left maxillary sinus. Visualized paranasal sinuses are otherwise clear. Visualized mastoids and middle ear cavities are well pneumatized and free of fluid.  Skeleton: No worrisome osseous lesions. Mild for age spondylosis within the cervical spine. Ossification of the  stylohyoid ligaments bilaterally, which can be seen with Eagle syndrome.  Upper chest: No other acute finding.  Other: None.  IMPRESSION: 1. Findings suggestive of acute left submandibular sialoadenitis. No obstructive stone or abnormal ductal dilatation. No discrete abscess or drainable fluid collection. 2. Few punctate sialoliths within the right submandibular gland without acute sialoadenitis. 3. 3 cm right thyroid  nodule, previously evaluated by ultrasound and biopsy in 2023. Please refer to these exams regarding any potential follow-up recommendations regarding this finding. 4. 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  Electronically Signed   By: Virgia Griffins M.D.   On: 07/20/2023 21:07    Assessment and Plan:  See Problem List for Assessment and Plan of chronic medical problems.   Follow Up Instructions:    I discussed the assessment and treatment plan with the patient. The patient was provided an opportunity to ask questions and all were answered. The patient agreed with the plan and demonstrated an understanding of the instructions.   The patient was advised to call back or seek an in-person evaluation if the symptoms worsen or if the condition fails to improve as anticipated.    Tammy Dauphin, MD

## 2023-07-26 DIAGNOSIS — K1120 Sialoadenitis, unspecified: Secondary | ICD-10-CM | POA: Insufficient documentation

## 2023-07-26 NOTE — Assessment & Plan Note (Deleted)
 Nodule seen on recent CT was biopsies in 2023 - Atypia of undetermined significance  A US  after one year for follow up was recommended

## 2023-07-27 ENCOUNTER — Ambulatory Visit: Admitting: Internal Medicine

## 2023-07-27 DIAGNOSIS — E042 Nontoxic multinodular goiter: Secondary | ICD-10-CM

## 2023-07-27 DIAGNOSIS — K1120 Sialoadenitis, unspecified: Secondary | ICD-10-CM

## 2023-08-04 DIAGNOSIS — M25512 Pain in left shoulder: Secondary | ICD-10-CM | POA: Diagnosis not present

## 2023-08-06 DIAGNOSIS — E1165 Type 2 diabetes mellitus with hyperglycemia: Secondary | ICD-10-CM | POA: Diagnosis not present

## 2023-08-17 NOTE — Progress Notes (Signed)
 Subjective:    Patient ID: Tammy Strickland, female    DOB: December 04, 1960, 63 y.o.   MRN: [id number]  HPI: Tammy Strickland is a 63 y.o. female who returns for follow up appointment for chronic pain and medication refill. She states her pain is located in her left shoulder, mid- lower back and bilateral knee pain, Also reports bilateral lower extremities and bilateral feet with tingling and burning. She rates her pain 10. Her current exercise regime is walking with her walker and performing stretching exercises.  Tammy Strickland Morphine  equivalent is 60.00 MME.   Last Oral Swab was Perormed on 06/21/2023, it was consistent.     Pain Inventory Average Pain 10 Pain Right Now 10 My pain is sharp, burning, dull, stabbing, tingling, and aching  In the last 24 hours, has pain interfered with the following? General activity 0 Relation with others 0 Enjoyment of life 0 What TIME of day is your pain at its worst? morning , daytime, evening, and night Sleep (in general) NA  Pain is worse with: walking, bending, sitting, inactivity, standing, and some activites Pain improves with: medication Relief from Meds: 10  Family History  Problem Relation Age of Onset   Hyperlipidemia Mother    Hypertension Mother    Stroke Mother    Thyroid  disease Mother    Heart attack Mother    Hyperlipidemia Father    Hypertension Father    Stroke Father    Heart attack Father    Hypertension Sister    Stroke Sister    Thyroid  disease Sister    Breast cancer Sister    Crohn's disease Sister    Hypertension Sister    Hypertension Brother    Diabetes Brother    Hypertension Brother    Social History   Socioeconomic History   Marital status: Single    Spouse name: n/a   Number of children: 3   Years of education: 12+   Highest education level: 11th grade  Occupational History   Occupation: disabled-falling, doesn't recall name of toxin    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  Tobacco  Use   Smoking status: Every Day    Current packs/day: 0.50    Average packs/day: 0.5 packs/day for 35.0 years (17.5 ttl pk-yrs)    Types: Cigarettes   Smokeless tobacco: Never   Tobacco comments:    referred  to smoking  cessation  classes at  [employer] Use   Vaping status: Never Used  Substance and Sexual Activity   Alcohol use: Yes    Comment: occasional   Drug use: No   Sexual activity: Not Currently    Partners: Female    Birth control/protection: Surgical    Comment: hysterectomy  Other Topics Concern   Not on file  Social History Narrative   Moved to [place] from [place], [place] February 2017, to help her daughter.   Lives with her daughter.   Sons live in [place] and [place].   She reports that there were originally 17 children in her family (she is the youngest), the oldest are deceased, some prior to her birth, and she isn't sure which were female/female or how they died.      Lives alone, son stays across the street-2025   Social Drivers of Health   Financial Resource Strain: Low Risk  (07/26/2023)   Overall Financial Resource Strain (CARDIA)    Difficulty of Paying Living Expenses: Not very hard  Food Insecurity: No Food Insecurity (07/26/2023)  Hunger Vital Sign    Worried About Running Out of Food in the Last Year: Never true    Ran Out of Food in the Last Year: Never true  Transportation Needs: No Transportation Needs (07/26/2023)   PRAPARE - Administrator, Civil Service (Medical): No    Lack of Transportation (Non-Medical): No  Physical Activity: Insufficiently Active (07/26/2023)   Exercise Vital Sign    Days of Exercise per Week: 1 day    Minutes of Exercise per Session: 40 min  Stress: Stress Concern Present (07/26/2023)   [employer] of Occupational Health - Occupational Stress Questionnaire    Feeling of Stress : Very much  Social Connections: Unknown (07/26/2023)   Social Connection and Isolation Panel [NHANES]    Frequency of  Communication with Friends and Family: Three times a week    Frequency of Social Gatherings with Friends and Family: Once a week    Attends Religious Services: More than 4 times per year    Active Member of [employer] or Organizations: No    Attends Banker Meetings: Never    Marital Status: Patient declined  Recent Concern: Social Connections - Socially Isolated (06/16/2023)   Social Connection and Isolation Panel [NHANES]    Frequency of Communication with Friends and Family: Twice a week    Frequency of Social Gatherings with Friends and Family: Never    Attends Religious Services: 1 to 4 times per year    Active Member of [employer] or Organizations: No    Attends Engineer, structural: Never    Marital Status: Never married   Past Surgical History:  Procedure Laterality Date   ABDOMINAL HYSTERECTOMY  1989   ovaries left   APPENDECTOMY  1989   BREAST LUMPECTOMY WITH RADIOACTIVE SEED LOCALIZATION Left 06/14/2019   Procedure: LEFT BREAST LUMPECTOMY WITH RADIOACTIVE SEED LOCALIZATION X 2;  Surgeon: Oza Blumenthal, MD;  Location: [facility];  Service: General;  Laterality: Left;  LMA   CHOLECYSTECTOMY  1989   IR GENERIC HISTORICAL  11/07/2015   IR ANGIO INTRA EXTRACRAN SEL COM CAROTID INNOMINATE BILAT MOD SED 11/07/2015 Luellen Sages, MD [facility]   IR GENERIC HISTORICAL  11/07/2015   IR ANGIO VERTEBRAL SEL SUBCLAVIAN INNOMINATE UNI R MOD SED 11/07/2015 Luellen Sages, MD [facility]   IR GENERIC HISTORICAL  11/07/2015   IR ANGIO VERTEBRAL SEL VERTEBRAL UNI L MOD SED 11/07/2015 Luellen Sages, MD [facility]   IR GENERIC HISTORICAL  11/07/2015   IR ANGIOGRAM EXTREMITY LEFT 11/07/2015 Luellen Sages, MD [facility]   MASS EXCISION Left 03/17/2021   Procedure: EXCISION MASS,left middle finger ulnar border mass excision,left thumb volar retinacular ganglion cyst excision;  Surgeon: Ltanya Rummer, MD;  Location: [facility];  Service: Orthopedics;   Laterality: Left;   TONSILLECTOMY  2000   adenoids removed   TOTAL KNEE ARTHROPLASTY Left 03/12/2017   Procedure: LEFT TOTAL KNEE ARTHROPLASTY;  Surgeon: Arnie Lao, MD;  Location: [facility];  Service: Orthopedics;  Laterality: Left;  Adductor Block   TRIGGER FINGER RELEASE Left 03/17/2021   Procedure: left trigger thumb release;  Surgeon: Ltanya Rummer, MD;  Location: [facility];  Service: Orthopedics;  Laterality: Left;  with local anesthesia   Past Surgical History:  Procedure Laterality Date   ABDOMINAL HYSTERECTOMY  1989   ovaries left   APPENDECTOMY  1989   BREAST LUMPECTOMY WITH RADIOACTIVE SEED LOCALIZATION Left 06/14/2019   Procedure: LEFT BREAST LUMPECTOMY WITH RADIOACTIVE SEED LOCALIZATION  X 2;  Surgeon: Oza Blumenthal, MD;  Location: [facility] OR;  Service: General;  Laterality: Left;  LMA   CHOLECYSTECTOMY  1989   IR GENERIC HISTORICAL  11/07/2015   IR ANGIO INTRA EXTRACRAN SEL COM CAROTID INNOMINATE BILAT MOD SED 11/07/2015 Luellen Sages, MD [facility]   IR GENERIC HISTORICAL  11/07/2015   IR ANGIO VERTEBRAL SEL SUBCLAVIAN INNOMINATE UNI R MOD SED 11/07/2015 Luellen Sages, MD [facility]   IR GENERIC HISTORICAL  11/07/2015   IR ANGIO VERTEBRAL SEL VERTEBRAL UNI L MOD SED 11/07/2015 Luellen Sages, MD [facility]   IR GENERIC HISTORICAL  11/07/2015   IR ANGIOGRAM EXTREMITY LEFT 11/07/2015 Luellen Sages, MD [facility]   MASS EXCISION Left 03/17/2021   Procedure: EXCISION MASS,left middle finger ulnar border mass excision,left thumb volar retinacular ganglion cyst excision;  Surgeon: Ltanya Rummer, MD;  Location: [facility];  Service: Orthopedics;  Laterality: Left;   TONSILLECTOMY  2000   adenoids removed   TOTAL KNEE ARTHROPLASTY Left 03/12/2017   Procedure: LEFT TOTAL KNEE ARTHROPLASTY;  Surgeon: Arnie Lao, MD;  Location: [facility];  Service: Orthopedics;  Laterality: Left;  Adductor Block   TRIGGER FINGER  RELEASE Left 03/17/2021   Procedure: left trigger thumb release;  Surgeon: Ltanya Rummer, MD;  Location: [facility];  Service: Orthopedics;  Laterality: Left;  with local anesthesia   Past Medical History:  Diagnosis Date   Ambulates with cane 03/13/2021   Aortic atherosclerosis (HCC)    Arthritis    Coronary artery disease    Diabetes mellitus without complication (HCC)    type 2    Fibromyalgia    Gallstones    GERD (gastroesophageal reflux disease)    HLD (hyperlipidemia)    Hypertension    Neuromuscular disorder (HCC)    Osteoporosis    Pancreatitis 05/27/2019   none since   Peripheral neuropathy 03/13/2021   all over pt pt   Shingles    TIA (transient ischemic attack)    Uses walker 03/13/2021   Wears glasses    There were no vitals taken for this visit.  Opioid Risk Score:   Fall Risk Score:  `1  Depression screen [facility] 2/9     06/21/2023    8:28 AM 06/16/2023    9:02 AM 11/25/2022    9:28 AM 09/24/2022   10:50 AM 08/26/2022    9:29 AM 07/29/2022   10:54 AM 06/30/2022    8:37 AM  Depression screen PHQ 2/9  Decreased Interest 3 0 0 0 0 2 0  Down, Depressed, Hopeless 3 0 0 0 0 0 0  PHQ - 2 Score 6 0 0 0 0 2 0  Altered sleeping  2  2  3    Tired, decreased energy  0  2  1   Change in appetite  0  0  1   Feeling bad or failure about yourself   0  0  0   Trouble concentrating  0  0  0   Moving slowly or fidgety/restless  0  0  0   Suicidal thoughts  0  0  0   PHQ-9 Score  2  4  7    Difficult doing work/chores  Not difficult at all  Not difficult at all        Review of Systems  Musculoskeletal:  Positive for arthralgias, back pain, gait problem and myalgias.       Generalized  All other systems reviewed and are  negative.      Objective:   Physical Exam Vitals and nursing note reviewed.  Constitutional:      Appearance: She is obese.  Cardiovascular:     Rate and Rhythm: Normal rate and regular rhythm.     Pulses: Normal pulses.     Heart  sounds: Normal heart sounds.  Pulmonary:     Breath sounds: Wheezing present.  Musculoskeletal:     Comments: Normal Muscle Bulk and Muscle Testing Reveals:  Upper Extremities:Right: Full  ROM and Muscle Strength  5/5 Left Upper Extremity: Decreased ROM 30 Degrees and Muscle Strength 5/5 Left AC Joint Tenderness  Lumbar Paraspinal Tenderness: L-4-L-5 Left Greater Trochanter Tenderness  Lower Extremities: Decreased ROM and Muscle Strength 5/5 Bilateral Lower extremities Flexion Produces Pain into her Bilateral Patella's Arises from Table slowly using walker or support Antalgic  Gait     Skin:    General: Skin is warm and dry.  Neurological:     Mental Status: She is oriented to person, place, and time.  Psychiatric:        Mood and Affect: Mood normal.        Behavior: Behavior normal.         Assessment & Plan:  Chronic Left Shoulder Pain: Ortho Following. Continue HEP as Tolerated. Continue to Monitor. 08/18/2023 Chronic Bilateral Low Back Pain without sciatica: Continue HEP as Tolerated. Continue current medication regimen. Continue to monitor. 08/18/2023 Bilateral Knee with Primary Osteoarthritis: Ortho Following. Continue current medication regimen. Continue to Monitor. 08/18/2023 Polyarthralgia: Continue HEP as Tolerated. Continue to monitor. 08/18/2023 Chronic Pain Syndrome: Refilled: Oxycodone  10/325mg  one tablet every 6 hours as needed for pain #120. Second script sent for the following month. We will continue the opioid monitoring program, this consists of regular clinic visits, examinations, urine drug screen, pill counts as well as use of [place]  Controlled Substance Reporting system. A 12 month History has been reviewed on the [place]  Controlled Substance Reporting System on 08/18/2023 Fall on steps: No Falls this month Educated on [employer]. She verbalizes understanding.    F/U in 2 months

## 2023-08-18 ENCOUNTER — Encounter: Payer: Self-pay | Admitting: Registered Nurse

## 2023-08-18 ENCOUNTER — Encounter: Attending: Registered Nurse | Admitting: Registered Nurse

## 2023-08-18 VITALS — BP 120/80 | HR 63 | Ht 66.0 in

## 2023-08-18 DIAGNOSIS — M2550 Pain in unspecified joint: Secondary | ICD-10-CM | POA: Diagnosis not present

## 2023-08-18 DIAGNOSIS — M1711 Unilateral primary osteoarthritis, right knee: Secondary | ICD-10-CM | POA: Diagnosis not present

## 2023-08-18 DIAGNOSIS — G629 Polyneuropathy, unspecified: Secondary | ICD-10-CM

## 2023-08-18 DIAGNOSIS — G8929 Other chronic pain: Secondary | ICD-10-CM | POA: Diagnosis not present

## 2023-08-18 DIAGNOSIS — M545 Low back pain, unspecified: Secondary | ICD-10-CM

## 2023-08-18 DIAGNOSIS — M1712 Unilateral primary osteoarthritis, left knee: Secondary | ICD-10-CM | POA: Diagnosis not present

## 2023-08-18 DIAGNOSIS — M546 Pain in thoracic spine: Secondary | ICD-10-CM | POA: Diagnosis not present

## 2023-08-18 DIAGNOSIS — M25512 Pain in left shoulder: Secondary | ICD-10-CM

## 2023-08-18 MED ORDER — OXYCODONE-ACETAMINOPHEN 10-325 MG PO TABS: 1.0000 | ORAL_TABLET | Freq: Four times a day (QID) | ORAL | 0 refills | Status: DC | PRN

## 2023-08-23 DIAGNOSIS — E1165 Type 2 diabetes mellitus with hyperglycemia: Secondary | ICD-10-CM | POA: Diagnosis not present

## 2023-08-30 ENCOUNTER — Other Ambulatory Visit: Payer: Self-pay | Admitting: Internal Medicine

## 2023-08-30 DIAGNOSIS — E1142 Type 2 diabetes mellitus with diabetic polyneuropathy: Secondary | ICD-10-CM

## 2023-09-05 DIAGNOSIS — E1165 Type 2 diabetes mellitus with hyperglycemia: Secondary | ICD-10-CM | POA: Diagnosis not present

## 2023-09-13 DIAGNOSIS — G8929 Other chronic pain: Secondary | ICD-10-CM | POA: Diagnosis not present

## 2023-09-13 DIAGNOSIS — M25361 Other instability, right knee: Secondary | ICD-10-CM | POA: Diagnosis not present

## 2023-09-16 ENCOUNTER — Other Ambulatory Visit: Payer: Self-pay | Admitting: Internal Medicine

## 2023-09-21 DIAGNOSIS — M1711 Unilateral primary osteoarthritis, right knee: Secondary | ICD-10-CM | POA: Diagnosis not present

## 2023-09-23 ENCOUNTER — Other Ambulatory Visit: Payer: Self-pay | Admitting: Physical Medicine and Rehabilitation

## 2023-10-06 DIAGNOSIS — E1165 Type 2 diabetes mellitus with hyperglycemia: Secondary | ICD-10-CM | POA: Diagnosis not present

## 2023-10-16 ENCOUNTER — Other Ambulatory Visit: Payer: Self-pay | Admitting: Internal Medicine

## 2023-10-18 ENCOUNTER — Encounter: Payer: Self-pay | Admitting: Registered Nurse

## 2023-10-18 ENCOUNTER — Telehealth: Payer: Self-pay | Admitting: Registered Nurse

## 2023-10-18 ENCOUNTER — Encounter: Attending: Registered Nurse | Admitting: Registered Nurse

## 2023-10-18 VITALS — BP 158/83 | HR 64 | Ht 66.0 in | Wt 276.0 lb

## 2023-10-18 DIAGNOSIS — M7061 Trochanteric bursitis, right hip: Secondary | ICD-10-CM | POA: Diagnosis not present

## 2023-10-18 DIAGNOSIS — Z79891 Long term (current) use of opiate analgesic: Secondary | ICD-10-CM | POA: Diagnosis not present

## 2023-10-18 DIAGNOSIS — Z5181 Encounter for therapeutic drug level monitoring: Secondary | ICD-10-CM | POA: Insufficient documentation

## 2023-10-18 DIAGNOSIS — M5416 Radiculopathy, lumbar region: Secondary | ICD-10-CM | POA: Insufficient documentation

## 2023-10-18 DIAGNOSIS — M2550 Pain in unspecified joint: Secondary | ICD-10-CM | POA: Insufficient documentation

## 2023-10-18 DIAGNOSIS — M1712 Unilateral primary osteoarthritis, left knee: Secondary | ICD-10-CM | POA: Insufficient documentation

## 2023-10-18 DIAGNOSIS — M1711 Unilateral primary osteoarthritis, right knee: Secondary | ICD-10-CM | POA: Diagnosis not present

## 2023-10-18 DIAGNOSIS — M7062 Trochanteric bursitis, left hip: Secondary | ICD-10-CM | POA: Insufficient documentation

## 2023-10-18 DIAGNOSIS — G894 Chronic pain syndrome: Secondary | ICD-10-CM | POA: Diagnosis not present

## 2023-10-18 DIAGNOSIS — F4321 Adjustment disorder with depressed mood: Secondary | ICD-10-CM | POA: Diagnosis present

## 2023-10-18 MED ORDER — OXYCODONE-ACETAMINOPHEN 10-325 MG PO TABS: 1.0000 | ORAL_TABLET | Freq: Four times a day (QID) | ORAL | 0 refills | Status: DC | PRN

## 2023-10-18 NOTE — Progress Notes (Signed)
 Subjective:    Patient ID: Tammy Strickland, female    DOB: 13-May-1960, 63 y.o.   MRN: [id number]  HPI: Tammy Strickland is a 63 y.o. female who arrived to office asking to be seen today, she reports her mother passed away few weeks ago, she admits to grieving and denies any suicidal ideation or plan to harm herself. She reports her pastor is offering her spiritual counseling, she also states she has good family support. She will call her insurance to require about Grief counseling, she will call this provider in a few weeks with update. She verbalizes understanding.  Tammy Strickland reports at her mothers funeral two weeks ago she had chest pain and took nitroglycerin , she denies any chest pain at this time. She refuses Ed or Urgent Care evaluation today. She will F/U with her PCP, she verbalizes understanding.  Tammy Strickland  states her pain is located in her lower back radiating into her bilateral lower extremities and bilateral feet with tingling and burning. She also reports bilateral knee pain. She rates her pain 10. Her current exercise regime is walking and performing stretching exercises.   Tammy Strickland  equivalent is 60.00 MME.   Last Oral Swab was Performed on 06/21/2023, it was consistent.    Pain Inventory Average Pain 10 Pain Right Now 10 My pain is sharp, burning, dull, stabbing, tingling, and aching  In the last 24 hours, has pain interfered with the following? General activity 1 Relation with others 4 Enjoyment of life 4 What TIME of day is your pain at its worst? morning , daytime, evening, and night Sleep (in general) Poor  Pain is worse with: walking, bending, sitting, standing, and some activites Pain improves with: heat/ice, therapy/exercise, and medication Relief from Meds: 5  Family History  Problem Relation Age of Onset   Hyperlipidemia Mother    Hypertension Mother    Stroke Mother    Thyroid  disease Mother    Heart attack Mother    Hyperlipidemia Father     Hypertension Father    Stroke Father    Heart attack Father    Hypertension Sister    Stroke Sister    Thyroid  disease Sister    Breast cancer Sister    Crohn's disease Sister    Hypertension Sister    Hypertension Brother    Diabetes Brother    Hypertension Brother    Social History   Socioeconomic History   Marital status: Single    Spouse name: n/a   Number of children: 3   Years of education: 12+   Highest education level: 11th grade  Occupational History   Occupation: disabled-falling, doesn't recall name of toxin    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  Tobacco Use   Smoking status: Every Day    Current packs/day: 0.50    Average packs/day: 0.5 packs/day for 35.0 years (17.5 ttl pk-yrs)    Types: Cigarettes   Smokeless tobacco: Never   Tobacco comments:    referred  to smoking  cessation  classes at  [employer] Use   Vaping status: Never Used  Substance and Sexual Activity   Alcohol use: Yes    Comment: occasional   Drug use: No   Sexual activity: Not Currently    Partners: Female    Birth control/protection: Surgical    Comment: hysterectomy  Other Topics Concern   Not on file  Social History Narrative   Moved to [place] from [place], [place] February 2017, to help her  daughter.   Lives with her daughter.   Sons live in [place] and [place].   She reports that there were originally 17 children in her family (she is the youngest), the oldest are deceased, some prior to her birth, and she isn't sure which were female/female or how they died.      Lives alone, son stays across the street-2025   Social Drivers of Health   Financial Resource Strain: Low Risk  (07/26/2023)   Overall Financial Resource Strain (CARDIA)    Difficulty of Paying Living Expenses: Not very hard  Food Insecurity: No Food Insecurity (07/26/2023)   Hunger Vital Sign    Worried About Running Out of Food in the Last Year: Never true    Ran Out of Food in the Last  Year: Never true  Transportation Needs: No Transportation Needs (07/26/2023)   PRAPARE - Administrator, Civil Service (Medical): No    Lack of Transportation (Non-Medical): No  Physical Activity: Insufficiently Active (07/26/2023)   Exercise Vital Sign    Days of Exercise per Week: 1 day    Minutes of Exercise per Session: 40 min  Stress: Stress Concern Present (07/26/2023)   [employer] of Occupational Health - Occupational Stress Questionnaire    Feeling of Stress : Very much  Social Connections: Unknown (07/26/2023)   Social Connection and Isolation Panel    Frequency of Communication with Friends and Family: Three times a week    Frequency of Social Gatherings with Friends and Family: Once a week    Attends Religious Services: More than 4 times per year    Active Member of [employer] or Organizations: No    Attends Banker Meetings: Never    Marital Status: Patient declined  Recent Concern: Social Connections - Socially Isolated (06/16/2023)   Social Connection and Isolation Panel    Frequency of Communication with Friends and Family: Twice a week    Frequency of Social Gatherings with Friends and Family: Never    Attends Religious Services: 1 to 4 times per year    Active Member of [employer] or Organizations: No    Attends Engineer, structural: Never    Marital Status: Never married   Past Surgical History:  Procedure Laterality Date   ABDOMINAL HYSTERECTOMY  1989   ovaries left   APPENDECTOMY  1989   BREAST LUMPECTOMY WITH RADIOACTIVE SEED LOCALIZATION Left 06/14/2019   Procedure: LEFT BREAST LUMPECTOMY WITH RADIOACTIVE SEED LOCALIZATION X 2;  Surgeon: Vernetta Berg, MD;  Location: [facility];  Service: General;  Laterality: Left;  LMA   CHOLECYSTECTOMY  1989   IR GENERIC HISTORICAL  11/07/2015   IR ANGIO INTRA EXTRACRAN SEL COM CAROTID INNOMINATE BILAT MOD SED 11/07/2015 Thyra Nash, MD [facility]   IR GENERIC HISTORICAL  11/07/2015   IR  ANGIO VERTEBRAL SEL SUBCLAVIAN INNOMINATE UNI R MOD SED 11/07/2015 Thyra Nash, MD [facility]   IR GENERIC HISTORICAL  11/07/2015   IR ANGIO VERTEBRAL SEL VERTEBRAL UNI L MOD SED 11/07/2015 Thyra Nash, MD [facility]   IR GENERIC HISTORICAL  11/07/2015   IR ANGIOGRAM EXTREMITY LEFT 11/07/2015 Thyra Nash, MD [facility]   MASS EXCISION Left 03/17/2021   Procedure: EXCISION MASS,left middle finger ulnar border mass excision,left thumb volar retinacular ganglion cyst excision;  Surgeon: Alyse Agent, MD;  Location: [facility];  Service: Orthopedics;  Laterality: Left;   TONSILLECTOMY  2000   adenoids removed   TOTAL KNEE ARTHROPLASTY Left 03/12/2017  Procedure: LEFT TOTAL KNEE ARTHROPLASTY;  Surgeon: Vernetta Lonni GRADE, MD;  Location: [facility];  Service: Orthopedics;  Laterality: Left;  Adductor Block   TRIGGER FINGER RELEASE Left 03/17/2021   Procedure: left trigger thumb release;  Surgeon: Alyse Agent, MD;  Location[facility];  Service: Orthopedics;  Laterality: Left;  with local anesthesia   Past Surgical History:  Procedure Laterality Date   ABDOMINAL HYSTERECTOMY  1989   ovaries left   APPENDECTOMY  1989   BREAST LUMPECTOMY WITH RADIOACTIVE SEED LOCALIZATION Left 06/14/2019   Procedure: LEFT BREAST LUMPECTOMY WITH RADIOACTIVE SEED LOCALIZATION X 2;  Surgeon: Vernetta Berg, MD;  Location: [facility];  Service: General;  Laterality: Left;  LMA   CHOLECYSTECTOMY  1989   IR GENERIC HISTORICAL  11/07/2015   IR ANGIO INTRA EXTRACRAN SEL COM CAROTID INNOMINATE BILAT MOD SED 11/07/2015 Thyra Nash, MD [facility]   IR GENERIC HISTORICAL  11/07/2015   IR ANGIO VERTEBRAL SEL SUBCLAVIAN INNOMINATE UNI R MOD SED 11/07/2015 Thyra Nash, MD [facility]   IR GENERIC HISTORICAL  11/07/2015   IR ANGIO VERTEBRAL SEL VERTEBRAL UNI L MOD SED 11/07/2015 Thyra Nash, MD [facility]   IR GENERIC HISTORICAL  11/07/2015   IR  ANGIOGRAM EXTREMITY LEFT 11/07/2015 Thyra Nash, MD [facility]   MASS EXCISION Left 03/17/2021   Procedure: EXCISION MASS,left middle finger ulnar border mass excision,left thumb volar retinacular ganglion cyst excision;  Surgeon: Alyse Agent, MD;  Location: [facility];  Service: Orthopedics;  Laterality: Left;   TONSILLECTOMY  2000   adenoids removed   TOTAL KNEE ARTHROPLASTY Left 03/12/2017   Procedure: LEFT TOTAL KNEE ARTHROPLASTY;  Surgeon: Vernetta Lonni GRADE, MD;  Location: [facility];  Service: Orthopedics;  Laterality: Left;  Adductor Block   TRIGGER FINGER RELEASE Left 03/17/2021   Procedure: left trigger thumb release;  Surgeon: Alyse Agent, MD;  Location: [facility];  Service: Orthopedics;  Laterality: Left;  with local anesthesia   Past Medical History:  Diagnosis Date   Ambulates with cane 03/13/2021   Aortic atherosclerosis (HCC)    Arthritis    Coronary artery disease    Diabetes mellitus without complication (HCC)    type 2    Fibromyalgia    Gallstones    GERD (gastroesophageal reflux disease)    HLD (hyperlipidemia)    Hypertension    Neuromuscular disorder (HCC)    Osteoporosis    Pancreatitis 05/27/2019   none since   Peripheral neuropathy 03/13/2021   all over pt pt   Shingles    TIA (transient ischemic attack)    Uses walker 03/13/2021   Wears glasses    There were no vitals taken for this visit.  Opioid Risk Score:   Fall Risk Score:  `1  Depression screen [facility] 2/9     08/18/2023    8:32 AM 06/21/2023    8:28 AM 06/16/2023    9:02 AM 11/25/2022    9:28 AM 09/24/2022   10:50 AM 08/26/2022    9:29 AM 07/29/2022   10:54 AM  Depression screen PHQ 2/9  Decreased Interest 3 3 0 0 0 0 2  Down, Depressed, Hopeless 3 3 0 0 0 0 0  PHQ - 2 Score 6 6 0 0 0 0 2  Altered sleeping   2  2  3   Tired, decreased energy   0  2  1  Change in appetite   0  0  1  Feeling bad or  failure about yourself    0  0  0  Trouble  concentrating   0  0  0  Moving slowly or fidgety/restless   0  0  0  Suicidal thoughts   0  0  0  PHQ-9 Score   2  4  7   Difficult doing work/chores   Not difficult at all  Not difficult at all        Review of Systems  Musculoskeletal:  Positive for back pain.       Bilateral knee pain Bilateral lower leg pain  All other systems reviewed and are negative.      Objective:   Physical Exam Vitals and nursing note reviewed.  Constitutional:      Appearance: Normal appearance.  Cardiovascular:     Rate and Rhythm: Normal rate and regular rhythm.     Pulses: Normal pulses.     Heart sounds: Normal heart sounds.  Pulmonary:     Effort: Pulmonary effort is normal.     Breath sounds: Normal breath sounds.  Musculoskeletal:     Comments: Normal Muscle Bulk and Muscle Testing Reveals:  Upper Extremities: Right: Full ROM and Muscle Strength 5/5 Left Upper Extremity: Decreased ROM 90 Degrees and Muscle Strength 5/5 Left AC Joint Tenderness  Lumbar Hypersensitivity Bilateral Greater Trochanter Tenderness Lower Extremities : Decreased ROM and Muscle Strength 5/5 Bilateral Lower Extremities Flexion Produces Pain into her Bilateral Patella's  Arises from Table slowly using cane for support Antalgic Gait     Skin:    General: Skin is warm and dry.  Neurological:     Mental Status: She is alert and oriented to person, place, and time.  Psychiatric:        Mood and Affect: Mood normal.        Behavior: Behavior normal.          Assessment & Plan:  Chronic Left Shoulder Pain: Ortho Following. No complaints today. Continue HEP as Tolerated. Continue to Monitor. 10/18/2023 Left Lumbar Radiculitis  Continue HEP as Tolerated. Continue current medication regimen. Continue to monitor. 10/18/2023 Bilateral Knee with Primary Osteoarthritis: Ortho Following. Continue current medication regimen. Continue to Monitor. 10/18/2023 Polyarthralgia: Continue HEP as Tolerated. Continue to  monitor. 10/18/2023 Chronic Pain Syndrome: Refilled: Oxycodone  10/325mg  one tablet every 6 hours as needed for pain #120. We will continue the opioid monitoring program, this consists of regular clinic visits, examinations, urine drug screen, pill counts as well as use of [place]  Controlled Substance Reporting system. A 12 month History has been reviewed on the [place]  Controlled Substance Reporting System on 10/18/2023 Fall on steps: No Falls this month Educated on [employer]. She verbalizes understanding.  Bilateral Greater Trochanter Bursitis: Continue HEP as Tolerated. Continue to Monitor,  Grieving: Ms/ Ledger reports her mother passed away a few weeks ago, tearful. Emotional support given. She is receiving spiritual counseling with her pastor, reports she has good family support. She will call her insurance to inquire about Grief counseling, she verbalizes understanding.     F/U in 2 months

## 2023-10-18 NOTE — Telephone Encounter (Deleted)
 Patient showed up on 7/7 by accident for follow up on 7/10. Would like a call from [place] if possible. She would like to be seen today instead of Thursday to discuss pain and low mood.

## 2023-10-18 NOTE — Telephone Encounter (Signed)
 Patient has been rescheduled for today instead of Thurs 7/10 to discuss pain/low mood

## 2023-10-21 ENCOUNTER — Encounter: Admitting: Registered Nurse

## 2023-11-05 DIAGNOSIS — E1165 Type 2 diabetes mellitus with hyperglycemia: Secondary | ICD-10-CM | POA: Diagnosis not present

## 2023-11-22 ENCOUNTER — Other Ambulatory Visit: Payer: Self-pay | Admitting: Internal Medicine

## 2023-11-22 ENCOUNTER — Encounter: Payer: Self-pay | Admitting: Internal Medicine

## 2023-11-22 NOTE — Progress Notes (Signed)
 Alexa okay for but not     Subjective:    Patient ID: Tammy Strickland, female    DOB: 1960/06/30, 63 y.o.   MRN: [id number]     HPI Tammy Strickland is here for follow up of her chronic medical problems.  I saw her last March for anemia which was discovered with preop testing.  She did do repeat blood work.  I have referred her to GI and they did call her several times but she did not answer.  She has not had follow-up since then.   Not taking iron .     Sides of mouth cracking  Tongue feels funny  Trying to quit smoking 1/2 ppd - she took her daughters pills - bupropion  2 days in row and it did seem to help and she wondered if she could take that  Taking vitamin d .   Seeing endocrine-still on Mounjaro  5 mg weekly    Medications and allergies reviewed with patient and updated if appropriate.  Current Outpatient Medications on File Prior to Visit  Medication Sig Dispense Refill   albuterol  (PROVENTIL ) (2.5 MG/3ML) 0.083% nebulizer solution Take 3 mLs (2.5 mg total) by nebulization every 6 (six) hours as needed for wheezing or shortness of breath. 150 mL 1   albuterol  (VENTOLIN  HFA) 108 (90 Base) MCG/ACT inhaler Inhale 2 puffs into the lungs every 6 (six) hours as needed for wheezing or shortness of breath. 8 g 8   amitriptyline  (ELAVIL ) 75 MG tablet Take 1 tablet by mouth at bedtime 30 tablet 5   aspirin  EC 81 MG tablet Take 1 tablet (81 mg total) by mouth daily.     atorvastatin  (LIPITOR) 20 MG tablet Take 1 tablet by mouth every day 30 tablet 11   cloNIDine  (CATAPRES ) 0.3 MG tablet Take 1 tablet by mouth 3 times a day 90 tablet 11   Continuous Glucose Sensor (DEXCOM G7 SENSOR) MISC Use 1 sensor for continuous glucose monitoring every 10 days 30 days 3 each 5   fluticasone  (FLONASE ) 50 MCG/ACT nasal spray Shake liquid & use 1 spray into each nostril every day as needed for allergies 16 g 11   furosemide  (LASIX ) 80 MG tablet Take 1 tablet by mouth every day 30 tablet 11   Incontinence  Supply Disposable (DEPEND FIT-FLEX-WOMEN-M) MISC Use as directed     Incontinence Supply Disposable (DEPEND UNDERWEAR LARGE) MISC UAD for incontinence 90 each 11   insulin  glargine, 2 Unit Dial, (TOUJEO  MAX SOLOSTAR) 300 UNIT/ML Solostar Pen Inject 160 Units into the skin every evening. And pen needles 1/day (Patient taking differently: Inject 150 Units into the skin 2 (two) times daily.) 45 mL 3   Insulin  Pen Needle (EASY TOUCH PEN NEEDLES) 32G X 5 MM MISC USE AS DIRECTED DAILY FOR  INSULIN  100 each 2   KLOR-CON  M10 10 MEQ tablet Take 2 tablets by mouth every day 60 tablet 11   losartan  (COZAAR ) 50 MG tablet Take 1 tablet by mouth twice daily 60 tablet 11   metoprolol  (TOPROL -XL) 200 MG 24 hr tablet Take 1 tablet by mouth twice daily 60 tablet 11   NARCAN 4 MG/0.1ML LIQD nasal spray kit Place 1 spray into the nose once.     nitroGLYCERIN  (NITROSTAT ) 0.4 MG SL tablet PLACE 1 TABLET UNDER THE TONGUE EVERY 5 MINS AS NEEDED FOR CHEST PAIN 25 tablet 2   oxyCODONE -acetaminophen  (PERCOCET) 10-325 MG tablet Take 1 tablet by mouth every 6 (six) hours as needed. Do Not Fill Before 10/21/2023 120 tablet  0   pantoprazole  (PROTONIX ) 40 MG tablet Take 1 tablet by mouth twice daily 60 tablet 11   tirzepatide  (MOUNJARO ) 5 MG/0.5ML Pen Inject 5 mg into the skin once a week. (Patient taking differently: Inject 5 mg into the skin every Sunday.) 2 mL 5   tiZANidine  (ZANAFLEX ) 4 MG tablet Take 1 tablet by mouth 3 times a day 90 tablet 5   ZTLIDO  1.8 % PTCH Apply 3 patches topically daily as needed (shoulder/back pain.).     No current facility-administered medications on file prior to visit.     Review of Systems  Constitutional:  Negative for fever.  Respiratory:  Positive for cough (a little). Negative for shortness of breath and wheezing.   Cardiovascular:  Positive for leg swelling. Negative for chest pain and palpitations.  Gastrointestinal:  Positive for constipation. Negative for abdominal pain, blood in  stool (no melena), diarrhea and nausea.       No gerd  Genitourinary:  Negative for hematuria.  Neurological:  Positive for headaches (infrequent). Negative for light-headedness.       Objective:   Vitals:   11/23/23 0850  BP: (!) 148/92  Pulse: 67  Temp: 98.5 F (36.9 C)  SpO2: 96%   BP Readings from Last 3 Encounters:  11/23/23 (!) 148/92  10/18/23 (!) 158/83  08/18/23 120/80   Wt Readings from Last 3 Encounters:  11/23/23 250 lb (113.4 kg)  10/18/23 276 lb (125.2 kg)  07/20/23 274 lb (124.3 kg)   Body mass index is 40.35 kg/m.    Physical Exam Constitutional:      General: She is not in acute distress.    Appearance: Normal appearance.  HENT:     Head: Normocephalic and atraumatic.     Mouth/Throat:     Mouth: Mucous membranes are dry.     Pharynx: No oropharyngeal exudate or posterior oropharyngeal erythema.     Comments: Corners of mouth are white/dry. Eyes:     Conjunctiva/sclera: Conjunctivae normal.  Cardiovascular:     Rate and Rhythm: Normal rate and regular rhythm.     Heart sounds: Normal heart sounds.  Pulmonary:     Effort: Pulmonary effort is normal. No respiratory distress.     Breath sounds: Normal breath sounds. No wheezing.  Musculoskeletal:     Cervical back: Neck supple.     Right lower leg: Edema (Mild) present.     Left lower leg: Edema (Mild) present.  Lymphadenopathy:     Cervical: No cervical adenopathy.  Skin:    General: Skin is warm and dry.     Findings: No rash.  Neurological:     Mental Status: She is alert. Mental status is at baseline.  Psychiatric:        Mood and Affect: Mood normal.        Behavior: Behavior normal.        Lab Results  Component Value Date   WBC 9.5 07/20/2023   HGB 9.7 (L) 07/20/2023   HCT 33.1 (L) 07/20/2023   PLT 384 07/20/2023   GLUCOSE 201 (H) 07/20/2023   CHOL 159 04/23/2023   TRIG 77.0 04/23/2023   HDL 76.50 04/23/2023   LDLDIRECT 101.0 09/19/2019   LDLCALC 67 04/23/2023   ALT  26 04/23/2023   AST 34 04/23/2023   NA 136 07/20/2023   K 3.4 (L) 07/20/2023   CL 103 07/20/2023   CREATININE 0.76 07/20/2023   BUN 8 07/20/2023   CO2 23 07/20/2023   TSH 5.02 01/07/2021  INR 1.06 11/07/2015   HGBA1C 6.6 (H) 06/04/2023     Assessment & Plan:    See Problem List for Assessment and Plan of chronic medical problems.

## 2023-11-22 NOTE — Patient Instructions (Addendum)
     Blood work was ordered.       Medications changes include :   taper of amitriptyline  as directed. Start bupropion  for the smoking.  Lotrisone  cream for the corners of your mouth      Return in about 3 months (around 02/23/2024) for follow up.

## 2023-11-23 ENCOUNTER — Telehealth: Payer: Self-pay | Admitting: *Deleted

## 2023-11-23 ENCOUNTER — Telehealth: Payer: Self-pay | Admitting: Registered Nurse

## 2023-11-23 ENCOUNTER — Ambulatory Visit: Admitting: Internal Medicine

## 2023-11-23 ENCOUNTER — Encounter: Payer: Self-pay | Admitting: Internal Medicine

## 2023-11-23 VITALS — BP 138/86 | HR 67 | Temp 98.5°F | Ht 66.0 in | Wt 250.0 lb

## 2023-11-23 DIAGNOSIS — F3289 Other specified depressive episodes: Secondary | ICD-10-CM

## 2023-11-23 DIAGNOSIS — J449 Chronic obstructive pulmonary disease, unspecified: Secondary | ICD-10-CM

## 2023-11-23 DIAGNOSIS — Z7985 Long-term (current) use of injectable non-insulin antidiabetic drugs: Secondary | ICD-10-CM

## 2023-11-23 DIAGNOSIS — I10 Essential (primary) hypertension: Secondary | ICD-10-CM

## 2023-11-23 DIAGNOSIS — E1142 Type 2 diabetes mellitus with diabetic polyneuropathy: Secondary | ICD-10-CM | POA: Diagnosis not present

## 2023-11-23 DIAGNOSIS — Z794 Long term (current) use of insulin: Secondary | ICD-10-CM

## 2023-11-23 DIAGNOSIS — I2510 Atherosclerotic heart disease of native coronary artery without angina pectoris: Secondary | ICD-10-CM

## 2023-11-23 DIAGNOSIS — N3946 Mixed incontinence: Secondary | ICD-10-CM

## 2023-11-23 DIAGNOSIS — E782 Mixed hyperlipidemia: Secondary | ICD-10-CM | POA: Diagnosis not present

## 2023-11-23 DIAGNOSIS — G4700 Insomnia, unspecified: Secondary | ICD-10-CM

## 2023-11-23 DIAGNOSIS — E611 Iron deficiency: Secondary | ICD-10-CM

## 2023-11-23 DIAGNOSIS — K140 Glossitis: Secondary | ICD-10-CM

## 2023-11-23 DIAGNOSIS — D649 Anemia, unspecified: Secondary | ICD-10-CM | POA: Diagnosis not present

## 2023-11-23 DIAGNOSIS — K219 Gastro-esophageal reflux disease without esophagitis: Secondary | ICD-10-CM

## 2023-11-23 DIAGNOSIS — I7121 Aneurysm of the ascending aorta, without rupture: Secondary | ICD-10-CM

## 2023-11-23 DIAGNOSIS — I5032 Chronic diastolic (congestive) heart failure: Secondary | ICD-10-CM | POA: Diagnosis not present

## 2023-11-23 DIAGNOSIS — G8929 Other chronic pain: Secondary | ICD-10-CM

## 2023-11-23 DIAGNOSIS — M545 Low back pain, unspecified: Secondary | ICD-10-CM

## 2023-11-23 LAB — LIPID PANEL
Cholesterol: 116 mg/dL (ref 0–200)
HDL: 47.6 mg/dL (ref >39.00)
LDL Cholesterol: 45 mg/dL (ref 0–99)
NonHDL: 68.67
Total CHOL/HDL Ratio: 2
Triglycerides: 116 mg/dL (ref 0.0–149.0)
VLDL: 23.2 mg/dL (ref 0.0–40.0)

## 2023-11-23 LAB — CBC WITH DIFFERENTIAL/PLATELET
Basophils Absolute: 0.1 K/uL (ref 0.0–0.1)
Basophils Relative: 1.1 % (ref 0.0–3.0)
Eosinophils Absolute: 0.2 K/uL (ref 0.0–0.7)
Eosinophils Relative: 2 % (ref 0.0–5.0)
HCT: 33.1 % — ABNORMAL LOW (ref 36.0–46.0)
Hemoglobin: 10.1 g/dL — ABNORMAL LOW (ref 12.0–15.0)
Lymphocytes Relative: 35.6 % (ref 12.0–46.0)
Lymphs Abs: 3.4 K/uL (ref 0.7–4.0)
MCHC: 30.5 g/dL (ref 30.0–36.0)
MCV: 67.7 fl — ABNORMAL LOW (ref 78.0–100.0)
Monocytes Absolute: 0.7 K/uL (ref 0.1–1.0)
Monocytes Relative: 7.5 % (ref 3.0–12.0)
Neutro Abs: 5.2 K/uL (ref 1.4–7.7)
Neutrophils Relative %: 53.8 % (ref 43.0–77.0)
Platelets: 362 K/uL (ref 150.0–400.0)
RBC: 4.9 Mil/uL (ref 3.87–5.11)
RDW: 21.4 % — ABNORMAL HIGH (ref 11.5–15.5)
WBC: 9.6 K/uL (ref 4.0–10.5)

## 2023-11-23 LAB — COMPREHENSIVE METABOLIC PANEL WITH GFR
ALT: 16 U/L (ref 0–35)
AST: 34 U/L (ref 0–37)
Albumin: 3.9 g/dL (ref 3.5–5.2)
Alkaline Phosphatase: 102 U/L (ref 39–117)
BUN: 8 mg/dL (ref 6–23)
CO2: 32 meq/L (ref 19–32)
Calcium: 9 mg/dL (ref 8.4–10.5)
Chloride: 95 meq/L — ABNORMAL LOW (ref 96–112)
Creatinine, Ser: 0.6 mg/dL (ref 0.40–1.20)
GFR: 96.73 mL/min (ref >60.00)
Glucose, Bld: 228 mg/dL — ABNORMAL HIGH (ref 70–99)
Potassium: 4 meq/L (ref 3.5–5.1)
Sodium: 136 meq/L (ref 135–145)
Total Bilirubin: 0.2 mg/dL (ref 0.2–1.2)
Total Protein: 7.9 g/dL (ref 6.0–8.3)

## 2023-11-23 LAB — IBC PANEL
Iron: 16 ug/dL — ABNORMAL LOW (ref 42–145)
Saturation Ratios: 3.8 % — ABNORMAL LOW (ref 20.0–50.0)
TIBC: 417.2 ug/dL (ref 250.0–450.0)
Transferrin: 298 mg/dL (ref 212.0–360.0)

## 2023-11-23 LAB — MICROALBUMIN / CREATININE URINE RATIO
Creatinine,U: 71.9 mg/dL
Microalb Creat Ratio: UNDETERMINED mg/g (ref 0.0–30.0)
Microalb, Ur: <0.7 mg/dL

## 2023-11-23 LAB — FERRITIN: Ferritin: 16.6 ng/mL (ref 10.0–291.0)

## 2023-11-23 LAB — HEMOGLOBIN A1C: Hgb A1c MFr Bld: 8.2 % — ABNORMAL HIGH (ref 4.6–6.5)

## 2023-11-23 LAB — FOLATE: Folate: 9.8 ng/mL (ref >5.9)

## 2023-11-23 LAB — VITAMIN B12: Vitamin B-12: 444 pg/mL (ref 211–911)

## 2023-11-23 MED ORDER — AMLODIPINE BESYLATE 5 MG PO TABS: 5.0000 mg | ORAL_TABLET | Freq: Every day | ORAL | 1 refills | Status: DC

## 2023-11-23 MED ORDER — OXYCODONE-ACETAMINOPHEN 10-325 MG PO TABS: 1.0000 | ORAL_TABLET | Freq: Four times a day (QID) | ORAL | 0 refills | Status: DC | PRN

## 2023-11-23 MED ORDER — BUPROPION HCL ER (XL) 150 MG PO TB24: 150.0000 mg | ORAL_TABLET | Freq: Every morning | ORAL | 5 refills | Status: DC

## 2023-11-23 MED ORDER — CLOTRIMAZOLE-BETAMETHASONE 1-0.05 % EX CREA: 1.0000 | TOPICAL_CREAM | Freq: Every day | CUTANEOUS | 0 refills | Status: AC

## 2023-11-23 MED ORDER — AMITRIPTYLINE HCL 25 MG PO TABS: ORAL_TABLET | ORAL | 0 refills | Status: DC

## 2023-11-23 NOTE — Assessment & Plan Note (Signed)
 Chronic Being monitored by cardiothoracic surgery

## 2023-11-23 NOTE — Assessment & Plan Note (Signed)
 Chronic Regular exercise and healthy diet encouraged Check lipid panel  Continue atorvastatin  20 mg daily

## 2023-11-23 NOTE — Assessment & Plan Note (Signed)
Chronic GERD controlled Continue pantoprazole 40 mg twice daily 

## 2023-11-23 NOTE — Assessment & Plan Note (Addendum)
 Chronic  Controlled Has been on amitriptyline  for years-likely for nerve pain, sleep that she is, but is not working well Taper off amitriptyline  Start bupropion  XL 150 mg daily which will hopefully help with depression and smoking cessation

## 2023-11-23 NOTE — Assessment & Plan Note (Addendum)
 March of this year had significant anemia-surgery was canceled Follow-up blood work ordered and referral to GI ordered-she did not follow-up with GI Not currently taking any iron  CBC, iron  levels, B12, folate

## 2023-11-23 NOTE — Assessment & Plan Note (Addendum)
 Chronic Blood pressure not ideally controlled-borderline even when rechecked CMP Continue losartan  50 mg twice daily, metoprolol  XL 200 mg twice daily, clonidine  0.3 mg 3 times daily Start amlodipine  5 mg daily

## 2023-11-23 NOTE — Assessment & Plan Note (Signed)
 Chronic Likely combination of stress incontinence and urge incontinence Has used incontinence supplies in the past and requested some Depends medium Bladder pads Agree both are medically necessary and will help her get the supplies needed

## 2023-11-23 NOTE — Telephone Encounter (Signed)
 Ms Lata called for a refill on her oxycodone  10-325.

## 2023-11-23 NOTE — Assessment & Plan Note (Signed)
 Chronic Controlled Continue Symbicort  twice daily and albuterol  nebulizer every 4-6 hours as needed

## 2023-11-23 NOTE — Assessment & Plan Note (Signed)
 Chronic No symptoms consistent with ischemia Continue aspirin 81 mg daily, atorvastatin and metoprolol

## 2023-11-23 NOTE — Assessment & Plan Note (Addendum)
 Chronic Earlier this year had significant anemia did not follow-up Check CBC, iron  panel Not currently taking iron  supplementation

## 2023-11-23 NOTE — Assessment & Plan Note (Addendum)
 Chronic Taper off amitriptyline  since it is not working Continue tizanidine  4 mg nightly-for pain management She has poor sleep hygiene and reviewed some of the changes that she needs to make

## 2023-11-23 NOTE — Assessment & Plan Note (Addendum)
 Chronic Following with endocrine Lab Results  Component Value Date   HGBA1C 6.6 (H) 06/04/2023   A1c today, urine microalbumin Continue Mounjaro  to 5 mg weekly-advised her to discuss with endocrine possibly increasing Mounjaro  On toujeo  150 units twice daily Stressed diabetic diet and eating regularly

## 2023-11-23 NOTE — Assessment & Plan Note (Signed)
 Chronic Appears to be euvolemic Continue lasix 80 mg daly

## 2023-11-23 NOTE — Telephone Encounter (Signed)
PMP was Reviewed.  Oxycodone e-scribed to pharmacy.

## 2023-11-23 NOTE — Assessment & Plan Note (Signed)
 Chronic Following with pain management On oxycodone -acetaminophen  10-325 mg 1 tab every 6 hours as needed, tizanidine  4 mg 3 times daily Will be tapering her off amitriptyline 

## 2023-11-24 ENCOUNTER — Ambulatory Visit: Payer: Self-pay | Admitting: Internal Medicine

## 2023-11-24 DIAGNOSIS — E1165 Type 2 diabetes mellitus with hyperglycemia: Secondary | ICD-10-CM

## 2023-11-24 DIAGNOSIS — D509 Iron deficiency anemia, unspecified: Secondary | ICD-10-CM

## 2023-11-24 DIAGNOSIS — Z794 Long term (current) use of insulin: Secondary | ICD-10-CM

## 2023-11-25 ENCOUNTER — Telehealth: Payer: Self-pay

## 2023-11-25 NOTE — Telephone Encounter (Signed)
 Patient stated her insurance will be active 12/13/2023. She has been advised to have the cards up-loaded to [facility], ASAP. Patient has been informed of the Good Rx Card.

## 2023-12-04 ENCOUNTER — Other Ambulatory Visit: Payer: Self-pay | Admitting: Internal Medicine

## 2023-12-06 NOTE — Telephone Encounter (Signed)
Tapered off medication

## 2023-12-10 ENCOUNTER — Telehealth: Payer: Self-pay

## 2023-12-10 NOTE — Telephone Encounter (Signed)
 Patient was identified as falling into the [place] Measure - Diabetes.   Patient was: Appointment scheduled for lab or office visit for A1c. Patient had a recent A1c 08/12, orders have been placed for next check

## 2023-12-20 ENCOUNTER — Encounter: Attending: Registered Nurse | Admitting: Registered Nurse

## 2023-12-20 ENCOUNTER — Encounter: Payer: Self-pay | Admitting: Registered Nurse

## 2023-12-20 VITALS — BP 99/65 | HR 61 | Ht 66.0 in | Wt 274.0 lb

## 2023-12-20 DIAGNOSIS — G894 Chronic pain syndrome: Secondary | ICD-10-CM | POA: Insufficient documentation

## 2023-12-20 DIAGNOSIS — Z5181 Encounter for therapeutic drug level monitoring: Secondary | ICD-10-CM | POA: Diagnosis not present

## 2023-12-20 DIAGNOSIS — M7061 Trochanteric bursitis, right hip: Secondary | ICD-10-CM | POA: Insufficient documentation

## 2023-12-20 DIAGNOSIS — M545 Low back pain, unspecified: Secondary | ICD-10-CM | POA: Diagnosis not present

## 2023-12-20 DIAGNOSIS — M1712 Unilateral primary osteoarthritis, left knee: Secondary | ICD-10-CM | POA: Diagnosis not present

## 2023-12-20 DIAGNOSIS — G629 Polyneuropathy, unspecified: Secondary | ICD-10-CM | POA: Insufficient documentation

## 2023-12-20 DIAGNOSIS — Z79891 Long term (current) use of opiate analgesic: Secondary | ICD-10-CM | POA: Insufficient documentation

## 2023-12-20 DIAGNOSIS — M7062 Trochanteric bursitis, left hip: Secondary | ICD-10-CM | POA: Diagnosis not present

## 2023-12-20 DIAGNOSIS — M1711 Unilateral primary osteoarthritis, right knee: Secondary | ICD-10-CM | POA: Insufficient documentation

## 2023-12-20 DIAGNOSIS — G8929 Other chronic pain: Secondary | ICD-10-CM | POA: Diagnosis not present

## 2023-12-20 MED ORDER — OXYCODONE-ACETAMINOPHEN 10-325 MG PO TABS: 1.0000 | ORAL_TABLET | Freq: Four times a day (QID) | ORAL | 0 refills | Status: DC | PRN

## 2023-12-20 MED ORDER — OXYCODONE-ACETAMINOPHEN 10-325 MG PO TABS
1.0000 | ORAL_TABLET | Freq: Four times a day (QID) | ORAL | 0 refills | Status: DC | PRN
Start: 2023-12-20 — End: 2023-12-20

## 2023-12-20 NOTE — Progress Notes (Signed)
 Subjective:    Patient ID: Tammy Strickland, female    DOB: August 07, 1960, 63 y.o.   MRN: [id number]  HPI: Tammy Strickland is a 63 y.o. female who returns for follow up appointment for chronic pain and medication refill. She states her pain is located in her lower back, Bilateral hips, bilateral knees and reports tingling and burning in her bilateral feet. She rates her pain 10. Her current exercise regime is walking and performing stretching exercises.  Tammy Strickland  equivalent is 60.00 MME.   Oral Swab was Performed today.      Pain Inventory Average Pain 10 Pain Right Now 10 My pain is constant, sharp, burning, stabbing, tingling, and itching  In the last 24 hours, has pain interfered with the following? General activity 10 Relation with others 9 Enjoyment of life 10 What TIME of day is your pain at its worst? daytime, evening, and night Sleep (in general) Poor  Pain is worse with: walking, bending, sitting, inactivity, standing, and some activites Pain improves with: heat/ice, medication, and injections Relief from Meds: 6  Family History  Problem Relation Age of Onset   Hyperlipidemia Mother    Hypertension Mother    Stroke Mother    Thyroid  disease Mother    Heart attack Mother    Hyperlipidemia Father    Hypertension Father    Stroke Father    Heart attack Father    Hypertension Sister    Stroke Sister    Thyroid  disease Sister    Breast cancer Sister    Crohn's disease Sister    Hypertension Sister    Hypertension Brother    Diabetes Brother    Hypertension Brother    Social History   Socioeconomic History   Marital status: Single    Spouse name: n/a   Number of children: 3   Years of education: 12+   Highest education level: 11th grade  Occupational History   Occupation: disabled-falling, doesn't recall name of toxin    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  Tobacco Use   Smoking status: Every Day    Current packs/day: 0.50     Average packs/day: 0.5 packs/day for 35.0 years (17.5 ttl pk-yrs)    Types: Cigarettes   Smokeless tobacco: Never   Tobacco comments:    referred  to smoking  cessation  classes at  [employer] Use   Vaping status: Never Used  Substance and Sexual Activity   Alcohol use: Yes    Comment: occasional   Drug use: No   Sexual activity: Not Currently    Partners: Female    Birth control/protection: Surgical    Comment: hysterectomy  Other Topics Concern   Not on file  Social History Narrative   Moved to [place] from [place], [place] February 2017, to help her daughter.   Lives with her daughter.   Sons live in [place] and [place].   She reports that there were originally 17 children in her family (she is the youngest), the oldest are deceased, some prior to her birth, and she isn't sure which were female/female or how they died.      Lives alone, son stays across the street-2025   Social Drivers of Health   Financial Resource Strain: Low Risk  (07/26/2023)   Overall Financial Resource Strain (CARDIA)    Difficulty of Paying Living Expenses: Not very hard  Food Insecurity: No Food Insecurity (07/26/2023)   Hunger Vital Sign    Worried About Running Out of Food  in the Last Year: Never true    Ran Out of Food in the Last Year: Never true  Transportation Needs: No Transportation Needs (07/26/2023)   PRAPARE - Administrator, Civil Service (Medical): No    Lack of Transportation (Non-Medical): No  Physical Activity: Insufficiently Active (07/26/2023)   Exercise Vital Sign    Days of Exercise per Week: 1 day    Minutes of Exercise per Session: 40 min  Stress: Stress Concern Present (07/26/2023)   [employer] of Occupational Health - Occupational Stress Questionnaire    Feeling of Stress : Very much  Social Connections: Unknown (07/26/2023)   Social Connection and Isolation Panel    Frequency of Communication with Friends and Family: Three times a week    Frequency of  Social Gatherings with Friends and Family: Once a week    Attends Religious Services: More than 4 times per year    Active Member of [employer] or Organizations: No    Attends Banker Meetings: Never    Marital Status: Patient declined  Recent Concern: Social Connections - Socially Isolated (06/16/2023)   Social Connection and Isolation Panel    Frequency of Communication with Friends and Family: Twice a week    Frequency of Social Gatherings with Friends and Family: Never    Attends Religious Services: 1 to 4 times per year    Active Member of [employer] or Organizations: No    Attends Engineer, structural: Never    Marital Status: Never married   Past Surgical History:  Procedure Laterality Date   ABDOMINAL HYSTERECTOMY  1989   ovaries left   APPENDECTOMY  1989   BREAST LUMPECTOMY WITH RADIOACTIVE SEED LOCALIZATION Left 06/14/2019   Procedure: LEFT BREAST LUMPECTOMY WITH RADIOACTIVE SEED LOCALIZATION X 2;  Surgeon: Vernetta Berg, MD;  [facility];  Service: General;  Laterality: Left;  LMA   CHOLECYSTECTOMY  1989   IR GENERIC HISTORICAL  11/07/2015   IR ANGIO INTRA EXTRACRAN SEL COM CAROTID INNOMINATE BILAT MOD SED 11/07/2015 Thyra Nash, MD [facility]   IR GENERIC HISTORICAL  11/07/2015   IR ANGIO VERTEBRAL SEL SUBCLAVIAN INNOMINATE UNI R MOD SED 11/07/2015 Thyra Nash, MD [facility]   IR GENERIC HISTORICAL  11/07/2015   IR ANGIO VERTEBRAL SEL VERTEBRAL UNI L MOD SED 11/07/2015 Thyra Nash, MD [facility]   IR GENERIC HISTORICAL  11/07/2015   IR ANGIOGRAM EXTREMITY LEFT 11/07/2015 Thyra Nash, MD [facility]   MASS EXCISION Left 03/17/2021   Procedure: EXCISION MASS,left middle finger ulnar border mass excision,left thumb volar retinacular ganglion cyst excision;  Surgeon: Alyse Agent, MD;  Location: [facility];  Service: Orthopedics;  Laterality: Left;   TONSILLECTOMY  2000   adenoids removed   TOTAL KNEE  ARTHROPLASTY Left 03/12/2017   Procedure: LEFT TOTAL KNEE ARTHROPLASTY;  Surgeon: Vernetta Lonni GRADE, MD;  Location: [facility];  Service: Orthopedics;  Laterality: Left;  Adductor Block   TRIGGER FINGER RELEASE Left 03/17/2021   Procedure: left trigger thumb release;  Surgeon: Alyse Agent, MD;  Location[facility];  Service: Orthopedics;  Laterality: Left;  with local anesthesia   Past Surgical History:  Procedure Laterality Date   ABDOMINAL HYSTERECTOMY  1989   ovaries left   APPENDECTOMY  1989   BREAST LUMPECTOMY WITH RADIOACTIVE SEED LOCALIZATION Left 06/14/2019   Procedure: LEFT BREAST LUMPECTOMY WITH RADIOACTIVE SEED LOCALIZATION X 2;  Surgeon: Vernetta Berg, MD;  [facility];  Service: General;  Laterality: Left;  LMA   CHOLECYSTECTOMY  1989   IR GENERIC HISTORICAL  11/07/2015   IR ANGIO INTRA EXTRACRAN SEL COM CAROTID INNOMINATE BILAT MOD SED 11/07/2015 Thyra Nash, MD [facility]   IR GENERIC HISTORICAL  11/07/2015   IR ANGIO VERTEBRAL SEL SUBCLAVIAN INNOMINATE UNI R MOD SED 11/07/2015 Thyra Nash, MD [facility]   IR GENERIC HISTORICAL  11/07/2015   IR ANGIO VERTEBRAL SEL VERTEBRAL UNI L MOD SED 11/07/2015 Thyra Nash, MD [facility]   IR GENERIC HISTORICAL  11/07/2015   IR ANGIOGRAM EXTREMITY LEFT 11/07/2015 Thyra Nash, MD [facility]   MASS EXCISION Left 03/17/2021   Procedure: EXCISION MASS,left middle finger ulnar border mass excision,left thumb volar retinacular ganglion cyst excision;  Surgeon: Alyse Agent, MD;  Location: [facility];  Service: Orthopedics;  Laterality: Left;   TONSILLECTOMY  2000   adenoids removed   TOTAL KNEE ARTHROPLASTY Left 03/12/2017   Procedure: LEFT TOTAL KNEE ARTHROPLASTY;  Surgeon: Vernetta Lonni GRADE, MD;  [facility];  Service: Orthopedics;  Laterality: Left;  Adductor Block   TRIGGER FINGER RELEASE Left 03/17/2021   Procedure: left trigger thumb release;  Surgeon:  Alyse Agent, MD;  [facility];  Service: Orthopedics;  Laterality: Left;  with local anesthesia   Past Medical History:  Diagnosis Date   Ambulates with cane 03/13/2021   Aortic atherosclerosis (HCC)    Arthritis    Coronary artery disease    Diabetes mellitus without complication (HCC)    type 2    Fibromyalgia    Gallstones    GERD (gastroesophageal reflux disease)    HLD (hyperlipidemia)    Hypertension    Neuromuscular disorder (HCC)    Osteoporosis    Pancreatitis 05/27/2019   none since   Peripheral neuropathy 03/13/2021   all over pt pt   Shingles    TIA (transient ischemic attack)    Uses walker 03/13/2021   Wears glasses    There were no vitals taken for this visit.  Opioid Risk Score:   Fall Risk Score:  `1  Depression screen [facility] 2/9     10/18/2023    8:54 AM 08/18/2023    8:32 AM 06/21/2023    8:28 AM 06/16/2023    9:02 AM 11/25/2022    9:28 AM 09/24/2022   10:50 AM 08/26/2022    9:29 AM  Depression screen PHQ 2/9  Decreased Interest 2 3 3  0 0 0 0  Down, Depressed, Hopeless 3 3 3  0 0 0 0  PHQ - 2 Score 5 6 6  0 0 0 0  Altered sleeping 3   2  2    Tired, decreased energy 3   0  2   Change in appetite 3   0  0   Feeling bad or failure about yourself  2   0  0   Trouble concentrating 0   0  0   Moving slowly or fidgety/restless 1   0  0   Suicidal thoughts 0   0  0   PHQ-9 Score 17   2  4    Difficult doing work/chores Somewhat difficult   Not difficult at all  Not difficult at all     Review of Systems  Musculoskeletal:  Positive for back pain and gait problem.       Pain in both legs, both feet, both hand, pain both arms  All other systems reviewed and are negative.  Objective:   Physical Exam Constitutional:      Appearance: Normal appearance. She is obese.  Cardiovascular:     Rate and Rhythm: Normal rate and regular rhythm.     Pulses: Normal pulses.     Heart sounds: Normal heart sounds.  Pulmonary:     Effort:  Pulmonary effort is normal.     Breath sounds: Normal breath sounds.  Musculoskeletal:     Comments: Normal Muscle Bulk and Muscle Testing Reveals:  Upper Extremities: Right: Decreased ROM 90 Degrees  and Muscle Strength 5/5 Left Upper Extremity: Decreased ROM 45 Degrees and Muscle Strength 5/5 Lumbar Paraspinal Tenderness: L-4-L-5 Bilateral Greater Trochanter Tenderness Lower Extremities: Decreased ROM and Muscle Strength 5/5 Bilateral Lower Extremities Flexion Produces Pain into her Bilateral Patella Arises from Table slowly using walker for support Antalgic  Gait     Skin:    General: Skin is warm and dry.  Neurological:     Mental Status: She is alert and oriented to person, place, and time.  Psychiatric:        Mood and Affect: Mood normal.        Behavior: Behavior normal.           Assessment & Plan:  Chronic Left Shoulder Pain: Ortho Following. No complaints today. Continue HEP as Tolerated. Continue to Monitor. 12/20/2023 Left Lumbar Radiculitis  Continue HEP as Tolerated. Continue current medication regimen. Continue to monitor. 12/20/2023 Bilateral Knee with Primary Osteoarthritis: Ortho Following. Continue current medication regimen. Continue to Monitor. 12/20/2023 Polyarthralgia: Continue HEP as Tolerated. Continue to monitor. 12/20/2023 Chronic Pain Syndrome: Refilled: Oxycodone  10/325mg  one tablet every 6 hours as needed for pain #120. We will continue the opioid monitoring program, this consists of regular clinic visits, examinations, urine drug screen, pill counts as well as use of [place]  Controlled Substance Reporting system. A 12 month History has been reviewed on the [place]  Controlled Substance Reporting System on 12/20/2023 Fall on steps: No Falls this month Educated on [employer]. She verbalizes understanding. 12/20/2023 Bilateral Greater Trochanter Bursitis: Continue HEP as Tolerated. Continue to Monitor, 12/20/2023   F/U in 2 months

## 2023-12-23 DIAGNOSIS — I10 Essential (primary) hypertension: Secondary | ICD-10-CM | POA: Diagnosis not present

## 2023-12-23 DIAGNOSIS — E1165 Type 2 diabetes mellitus with hyperglycemia: Secondary | ICD-10-CM | POA: Diagnosis not present

## 2023-12-23 DIAGNOSIS — F17200 Nicotine dependence, unspecified, uncomplicated: Secondary | ICD-10-CM | POA: Diagnosis not present

## 2023-12-23 DIAGNOSIS — E1142 Type 2 diabetes mellitus with diabetic polyneuropathy: Secondary | ICD-10-CM | POA: Diagnosis not present

## 2023-12-23 DIAGNOSIS — M159 Polyosteoarthritis, unspecified: Secondary | ICD-10-CM | POA: Diagnosis not present

## 2023-12-23 LAB — DRUG TOX MONITOR 1 W/CONF, ORAL FLD
Amphetamines: NEGATIVE ng/mL (ref <10)
Barbiturates: NEGATIVE ng/mL (ref <10)
Benzodiazepines: NEGATIVE ng/mL (ref <0.50)
Buprenorphine: NEGATIVE ng/mL (ref <0.10)
Cocaine: NEGATIVE ng/mL (ref <5.0)
Codeine: NEGATIVE ng/mL (ref <2.5)
Dihydrocodeine: NEGATIVE ng/mL (ref <2.5)
Fentanyl: NEGATIVE ng/mL (ref <0.10)
Heroin Metabolite: NEGATIVE ng/mL (ref <1.0)
Hydrocodone: NEGATIVE ng/mL (ref <2.5)
Hydromorphone: NEGATIVE ng/mL (ref <2.5)
MARIJUANA: NEGATIVE ng/mL (ref <2.5)
MDMA: NEGATIVE ng/mL (ref <10)
Meprobamate: NEGATIVE ng/mL (ref <2.5)
Methadone: NEGATIVE ng/mL (ref <5.0)
Morphine: NEGATIVE ng/mL (ref <2.5)
Nicotine Metabolite: NEGATIVE ng/mL (ref <5.0)
Norhydrocodone: NEGATIVE ng/mL (ref <2.5)
Noroxycodone: NEGATIVE ng/mL (ref <2.5)
Opiates: POSITIVE ng/mL — AB (ref <2.5)
Oxycodone: 8.5 ng/mL — ABNORMAL HIGH (ref <2.5)
Oxymorphone: NEGATIVE ng/mL (ref <2.5)
Phencyclidine: NEGATIVE ng/mL (ref <10)
Tapentadol: NEGATIVE ng/mL (ref <5.0)
Tramadol: NEGATIVE ng/mL (ref <5.0)
Zolpidem: NEGATIVE ng/mL (ref <5.0)

## 2023-12-23 LAB — DRUG TOX ALC METAB W/CON, ORAL FLD: Alcohol Metabolite: NEGATIVE ng/mL (ref <25)

## 2024-01-06 DIAGNOSIS — E1165 Type 2 diabetes mellitus with hyperglycemia: Secondary | ICD-10-CM | POA: Diagnosis not present

## 2024-01-26 ENCOUNTER — Encounter: Payer: Self-pay | Admitting: Internal Medicine

## 2024-01-26 ENCOUNTER — Ambulatory Visit (HOSPITAL_COMMUNITY): Admission: RE | Admit: 2024-01-26 | Source: Ambulatory Visit

## 2024-01-26 ENCOUNTER — Other Ambulatory Visit (HOSPITAL_COMMUNITY): Payer: Self-pay | Admitting: Physician Assistant

## 2024-01-26 ENCOUNTER — Other Ambulatory Visit: Payer: Self-pay | Admitting: Thoracic Surgery (Cardiothoracic Vascular Surgery)

## 2024-01-26 DIAGNOSIS — M25561 Pain in right knee: Secondary | ICD-10-CM | POA: Diagnosis not present

## 2024-01-26 DIAGNOSIS — M79604 Pain in right leg: Secondary | ICD-10-CM | POA: Diagnosis not present

## 2024-01-26 DIAGNOSIS — M79605 Pain in left leg: Secondary | ICD-10-CM

## 2024-01-26 DIAGNOSIS — I7121 Aneurysm of the ascending aorta, without rupture: Secondary | ICD-10-CM

## 2024-01-27 ENCOUNTER — Ambulatory Visit (HOSPITAL_COMMUNITY)
Admission: RE | Admit: 2024-01-27 | Discharge: 2024-01-27 | Disposition: A | Discharge status: Home / Self Care | Source: Ambulatory Visit | Attending: Physician Assistant | Admitting: Physician Assistant

## 2024-01-27 DIAGNOSIS — M79604 Pain in right leg: Secondary | ICD-10-CM | POA: Diagnosis not present

## 2024-01-29 ENCOUNTER — Other Ambulatory Visit: Payer: Self-pay | Admitting: Internal Medicine

## 2024-01-29 DIAGNOSIS — I10 Essential (primary) hypertension: Secondary | ICD-10-CM

## 2024-02-05 DIAGNOSIS — E1165 Type 2 diabetes mellitus with hyperglycemia: Secondary | ICD-10-CM | POA: Diagnosis not present

## 2024-02-14 ENCOUNTER — Telehealth: Payer: Self-pay

## 2024-02-14 NOTE — Telephone Encounter (Signed)
 Copied from CRM [id number]. Topic: General - Other >> Feb 14, 2024  9:37 AM Roselie BROCKS wrote: Reason for CRM: Patient states she just left [facility], and the specialist is faxing over paperwork, that just needs signed and faxed back over ASAP so [facility] can call and schedule surgery today.

## 2024-02-14 NOTE — Telephone Encounter (Unsigned)
 Copied from CRM [id number]. Topic: General - Other >> Feb 14, 2024 11:51 AM Carlyon D wrote: Reason for CRM: pt is calling in regards to her paperwork that was faxed over today from [facility] pt is asking for this to be faxed back over to them. Pt callback # [phone]

## 2024-02-15 NOTE — Telephone Encounter (Signed)
Form has not been received yet

## 2024-02-17 ENCOUNTER — Encounter: Attending: Registered Nurse | Admitting: Registered Nurse

## 2024-02-17 VITALS — BP 120/84 | HR 60 | Ht 66.0 in

## 2024-02-17 DIAGNOSIS — G629 Polyneuropathy, unspecified: Secondary | ICD-10-CM | POA: Insufficient documentation

## 2024-02-17 DIAGNOSIS — G894 Chronic pain syndrome: Secondary | ICD-10-CM | POA: Insufficient documentation

## 2024-02-17 DIAGNOSIS — G8929 Other chronic pain: Secondary | ICD-10-CM | POA: Insufficient documentation

## 2024-02-17 DIAGNOSIS — M1711 Unilateral primary osteoarthritis, right knee: Secondary | ICD-10-CM | POA: Diagnosis present

## 2024-02-17 DIAGNOSIS — Z5181 Encounter for therapeutic drug level monitoring: Secondary | ICD-10-CM | POA: Insufficient documentation

## 2024-02-17 DIAGNOSIS — Z79891 Long term (current) use of opiate analgesic: Secondary | ICD-10-CM | POA: Diagnosis present

## 2024-02-17 DIAGNOSIS — M7062 Trochanteric bursitis, left hip: Secondary | ICD-10-CM | POA: Diagnosis present

## 2024-02-17 DIAGNOSIS — M545 Low back pain, unspecified: Secondary | ICD-10-CM | POA: Diagnosis present

## 2024-02-17 DIAGNOSIS — M7061 Trochanteric bursitis, right hip: Secondary | ICD-10-CM | POA: Diagnosis present

## 2024-02-17 DIAGNOSIS — M1712 Unilateral primary osteoarthritis, left knee: Secondary | ICD-10-CM | POA: Diagnosis present

## 2024-02-17 DIAGNOSIS — M25512 Pain in left shoulder: Secondary | ICD-10-CM | POA: Insufficient documentation

## 2024-02-17 MED ORDER — OXYCODONE-ACETAMINOPHEN 10-325 MG PO TABS: 1.0000 | ORAL_TABLET | Freq: Four times a day (QID) | ORAL | 0 refills | Status: DC | PRN

## 2024-02-17 NOTE — Progress Notes (Signed)
 Subjective:    Patient ID: Tammy Strickland, female    DOB: 02-10-1961, 63 y.o.   MRN: [id number]  HPI: Tammy Strickland is a 63 y.o. female who returns for follow up appointment for chronic pain and medication refill. She states her pain is located in her left shoulder, lower back, bilateral hips and bilateral knee pain. She also reports tingling and burning in her bilateral hands and bilateral feet. She rates her pain 10. Her current exercise regime is walking and performing stretching exercises.  Tammy Strickland Morphine  equivalent is 60.00 MME.   Last Oral swab was Performed on 12/20/2023, it was consistent.     Pain Inventory Average Pain 10 Pain Right Now 10 My pain is sharp, burning, dull, stabbing, tingling, and aching  In the last 24 hours, has pain interfered with the following? General activity 1 Relation with others 1 Enjoyment of life 2 What TIME of day is your pain at its worst? morning , daytime, evening, and night Sleep (in general) Poor  Pain is worse with: walking, bending, sitting, inactivity, standing, and some activites Pain improves with: rest, heat/ice, and medication Relief from Meds: 6  Family History  Problem Relation Age of Onset   Hyperlipidemia Mother    Hypertension Mother    Stroke Mother    Thyroid  disease Mother    Heart attack Mother    Hyperlipidemia Father    Hypertension Father    Stroke Father    Heart attack Father    Hypertension Sister    Stroke Sister    Thyroid  disease Sister    Breast cancer Sister    Crohn's disease Sister    Hypertension Sister    Hypertension Brother    Diabetes Brother    Hypertension Brother    Social History   Socioeconomic History   Marital status: Single    Spouse name: n/a   Number of children: 3   Years of education: 12+   Highest education level: 11th grade  Occupational History   Occupation: disabled-falling, doesn't recall name of toxin    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  Tobacco Use   Smoking status: Every Day    Current packs/day: 0.50    Average packs/day: 0.5 packs/day for 35.0 years (17.5 ttl pk-yrs)    Types: Cigarettes   Smokeless tobacco: Never   Tobacco comments:    referred  to smoking  cessation  classes at  [employer] Use   Vaping status: Never Used  Substance and Sexual Activity   Alcohol use: Yes    Comment: occasional   Drug use: No   Sexual activity: Not Currently    Partners: Female    Birth control/protection: Surgical    Comment: hysterectomy  Other Topics Concern   Not on file  Social History Narrative   Moved to [place] from [place], [place] February 2017, to help her daughter.   Lives with her daughter.   Sons live in [place] and [place].   She reports that there were originally 17 children in her family (she is the youngest), the oldest are deceased, some prior to her birth, and she isn't sure which were female/female or how they died.      Lives alone, son stays across the street-2025   Social Drivers of Health   Financial Resource Strain: Low Risk  (07/26/2023)   Overall Financial Resource Strain (CARDIA)    Difficulty of Paying Living Expenses: Not very hard  Food Insecurity: No Food Insecurity (07/26/2023)  Hunger Vital Sign    Worried About Running Out of Food in the Last Year: Never true    Ran Out of Food in the Last Year: Never true  Transportation Needs: No Transportation Needs (07/26/2023)   PRAPARE - Administrator, Civil Service (Medical): No    Lack of Transportation (Non-Medical): No  Physical Activity: Insufficiently Active (07/26/2023)   Exercise Vital Sign    Days of Exercise per Week: 1 day    Minutes of Exercise per Session: 40 min  Stress: Stress Concern Present (07/26/2023)   [employer] of Occupational Health - Occupational Stress Questionnaire    Feeling of Stress : Very much  Social Connections: Unknown (07/26/2023)   Social Connection and Isolation Panel     Frequency of Communication with Friends and Family: Three times a week    Frequency of Social Gatherings with Friends and Family: Once a week    Attends Religious Services: More than 4 times per year    Active Member of [employer] or Organizations: No    Attends Banker Meetings: Never    Marital Status: Patient declined  Recent Concern: Social Connections - Socially Isolated (06/16/2023)   Social Connection and Isolation Panel    Frequency of Communication with Friends and Family: Twice a week    Frequency of Social Gatherings with Friends and Family: Never    Attends Religious Services: 1 to 4 times per year    Active Member of [employer] or Organizations: No    Attends Engineer, Structural: Never    Marital Status: Never married   Past Surgical History:  Procedure Laterality Date   ABDOMINAL HYSTERECTOMY  1989   ovaries left   APPENDECTOMY  1989   BREAST LUMPECTOMY WITH RADIOACTIVE SEED LOCALIZATION Left 06/14/2019   Procedure: LEFT BREAST LUMPECTOMY WITH RADIOACTIVE SEED LOCALIZATION X 2;  Surgeon: Vernetta Berg, MD;  [facility]: [facility];  Service: General;  Laterality: Left;  LMA   CHOLECYSTECTOMY  1989   IR GENERIC HISTORICAL  11/07/2015   IR ANGIO INTRA EXTRACRAN SEL COM CAROTID INNOMINATE BILAT MOD SED 11/07/2015 Thyra Nash, MD [facility]   IR GENERIC HISTORICAL  11/07/2015   IR ANGIO VERTEBRAL SEL SUBCLAVIAN INNOMINATE UNI R MOD SED 11/07/2015 Thyra Nash, MD [facility]   IR GENERIC HISTORICAL  11/07/2015   IR ANGIO VERTEBRAL SEL VERTEBRAL UNI L MOD SED 11/07/2015 Thyra Nash, MD [facility]   IR GENERIC HISTORICAL  11/07/2015   IR ANGIOGRAM EXTREMITY LEFT 11/07/2015 Thyra Nash, MD [facility]   MASS EXCISION Left 03/17/2021   Procedure: EXCISION MASS,left middle finger ulnar border mass excision,left thumb volar retinacular ganglion cyst excision;  Surgeon: Alyse Agent, MD;  Location: [facility];  Service:  Orthopedics;  Laterality: Left;   TONSILLECTOMY  2000   adenoids removed   TOTAL KNEE ARTHROPLASTY Left 03/12/2017   Procedure: LEFT TOTAL KNEE ARTHROPLASTY;  Surgeon: Vernetta Lonni GRADE, MD;  Location: [facility];  Service: Orthopedics;  Laterality: Left;  Adductor Block   TRIGGER FINGER RELEASE Left 03/17/2021   Procedure: left trigger thumb release;  Surgeon: Alyse Agent, MD;  Location: [facility];  Service: Orthopedics;  Laterality: Left;  with local anesthesia   Past Surgical History:  Procedure Laterality Date   ABDOMINAL HYSTERECTOMY  1989   ovaries left   APPENDECTOMY  1989   BREAST LUMPECTOMY WITH RADIOACTIVE SEED LOCALIZATION Left 06/14/2019   Procedure: LEFT BREAST LUMPECTOMY WITH RADIOACTIVE SEED LOCALIZATION X 2;  Surgeon: Vernetta Berg, MD;  Location: [facility];  Service: General;  Laterality: Left;  LMA   CHOLECYSTECTOMY  1989   IR GENERIC HISTORICAL  11/07/2015   IR ANGIO INTRA EXTRACRAN SEL COM CAROTID INNOMINATE BILAT MOD SED 11/07/2015 Thyra Nash, MD [facility]   IR GENERIC HISTORICAL  11/07/2015   IR ANGIO VERTEBRAL SEL SUBCLAVIAN INNOMINATE UNI R MOD SED 11/07/2015 Thyra Nash, MD [facility]   IR GENERIC HISTORICAL  11/07/2015   IR ANGIO VERTEBRAL SEL VERTEBRAL UNI L MOD SED 11/07/2015 Thyra Nash, MD [facility]   IR GENERIC HISTORICAL  11/07/2015   IR ANGIOGRAM EXTREMITY LEFT 11/07/2015 Thyra Nash, MD [facility]   MASS EXCISION Left 03/17/2021   Procedure: EXCISION MASS,left middle finger ulnar border mass excision,left thumb volar retinacular ganglion cyst excision;  Surgeon: Alyse Agent, MD;  Location: [facility];  Service: Orthopedics;  Laterality: Left;   TONSILLECTOMY  2000   adenoids removed   TOTAL KNEE ARTHROPLASTY Left 03/12/2017   Procedure: LEFT TOTAL KNEE ARTHROPLASTY;  Surgeon: Vernetta Lonni GRADE, MD;  [facility];  Service: Orthopedics;  Laterality: Left;  Adductor Block    TRIGGER FINGER RELEASE Left 03/17/2021   Procedure: left trigger thumb release;  Surgeon: Alyse Agent, MD;  Location: [facility];  Service: Orthopedics;  Laterality: Left;  with local anesthesia   Past Medical History:  Diagnosis Date   Ambulates with cane 03/13/2021   Aortic atherosclerosis    Arthritis    Coronary artery disease    Diabetes mellitus without complication (HCC)    type 2    Fibromyalgia    Gallstones    GERD (gastroesophageal reflux disease)    HLD (hyperlipidemia)    Hypertension    Neuromuscular disorder (HCC)    Osteoporosis    Pancreatitis 05/27/2019   none since   Peripheral neuropathy 03/13/2021   all over pt pt   Shingles    TIA (transient ischemic attack)    Uses walker 03/13/2021   Wears glasses    BP 120/84   Pulse (!) 58   Ht 5' 6 (1.676 m)   SpO2 93%   BMI 44.22 kg/m   Opioid Risk Score:   Fall Risk Score:  `1  Depression screen [facility] 2/9     12/20/2023    8:39 AM 10/18/2023    8:54 AM 08/18/2023    8:32 AM 06/21/2023    8:28 AM 06/16/2023    9:02 AM 11/25/2022    9:28 AM 09/24/2022   10:50 AM  Depression screen PHQ 2/9  Decreased Interest 1 2 3 3  0 0 0  Down, Depressed, Hopeless 1 3 3 3  0 0 0  PHQ - 2 Score 2 5 6 6  0 0 0  Altered sleeping  3   2  2   Tired, decreased energy  3   0  2  Change in appetite  3   0  0  Feeling bad or failure about yourself   2   0  0  Trouble concentrating  0   0  0  Moving slowly or fidgety/restless  1   0  0  Suicidal thoughts  0   0  0  PHQ-9 Score  17   2  4   Difficult doing work/chores  Somewhat difficult   Not difficult at all  Not difficult at all     Review of Systems  Musculoskeletal:  Positive for back pain, gait problem, myalgias and neck  pain.  Neurological:  Positive for weakness.  All other systems reviewed and are negative.      Objective:   Physical Exam Vitals and nursing note reviewed.  Constitutional:      Appearance: Normal appearance.  Cardiovascular:      Rate and Rhythm: Normal rate and regular rhythm.     Pulses: Normal pulses.     Heart sounds: Normal heart sounds.  Pulmonary:     Effort: Pulmonary effort is normal.     Breath sounds: Normal breath sounds.  Musculoskeletal:     Comments: Normal Muscle Bulk and Muscle Testing Reveals:  Upper Extremities: Right: Full ROM and Muscle Strength 5/5 Left Upper Extremity: Decreased ROM 45 Degrees and Muscle Strength 5/5 Left AC Joint Tenderness  Lumbar Paraspinal Tenderness: L-4-L-5 Lower Extremities: Right: Decreased ROM and Muscle Strength 3/5 Left Lower extremity: Full ROM and Muscle Strength 5/5 Arrived in wheelchair    Skin:    General: Skin is warm and dry.  Neurological:     Mental Status: She is alert and oriented to person, place, and time.  Psychiatric:        Mood and Affect: Mood normal.        Behavior: Behavior normal.          Assessment & Plan:  Chronic Left Shoulder Pain: Ortho Following. Continue HEP as Tolerated. Continue to Monitor. 02/17/2024 Chronic Bilateral Lower Back Pain without sciatica/Left Lumbar Radiculitis  Continue HEP as Tolerated. Continue current medication regimen. Continue to monitor. 02/17/2024 Bilateral Knee with Primary Osteoarthritis: Ortho Following. Continue current medication regimen. Continue to Monitor. 02/17/2024 Polyarthralgia: Continue HEP as Tolerated. Continue to monitor. 02/17/2024 Chronic Pain Syndrome: Refilled: Oxycodone  10/325mg  one tablet every 6 hours as needed for pain #120. We will continue the opioid monitoring program, this consists of regular clinic visits, examinations, urine drug screen, pill counts as well as use of [place]  Controlled Substance Reporting system. A 12 month History has been reviewed on the [place]  Controlled Substance Reporting System on 02/17/2024 Fall on steps: No Falls this month Educated on [employer]. She verbalizes understanding. 02/17/2024 Bilateral Greater Trochanter Bursitis:  Continue HEP as Tolerated. Continue to Monitor, 02/17/2024   F/U in 2 months

## 2024-02-21 ENCOUNTER — Encounter: Payer: Self-pay | Admitting: Internal Medicine

## 2024-02-21 NOTE — Progress Notes (Unsigned)
 Subjective:    Patient ID: Tammy Strickland, female    DOB: June 18, 1960, 63 y.o.   MRN: [id number]      HPI Tammy Strickland is here for pre-operative clearance at the request of Dr Fidel for right knee arthroplasty scheduled for TBD.   Tammy Strickland denies any personal or family history of problems with anesthesia or bleeding/blood clot problems.    Tammy Strickland has no concerns and is taking all prescribed medication as prescribed.   Tammy Strickland is not exercising regularly.  With their daily activities they denies chest pain, palpitations and lightheadedness.  She denies any shortness of breath with exertion, but does have it at rest with her panic attacks.     Medications and allergies reviewed with patient and updated if appropriate.  Current Outpatient Medications on File Prior to Visit  Medication Sig Dispense Refill   albuterol  (PROVENTIL ) (2.5 MG/3ML) 0.083% nebulizer solution Take 3 mLs (2.5 mg total) by nebulization every 6 (six) hours as needed for wheezing or shortness of breath. 150 mL 1   albuterol  (VENTOLIN  HFA) 108 (90 Base) MCG/ACT inhaler Inhale 2 puffs into the lungs every 6 (six) hours as needed for wheezing or shortness of breath. 8 g 8   amLODipine  (NORVASC ) 5 MG tablet Take 1 tablet (5 mg total) by mouth daily. 90 tablet 1   aspirin  EC 81 MG tablet Take 1 tablet (81 mg total) by mouth daily.     atorvastatin  (LIPITOR) 20 MG tablet Take 1 tablet by mouth every day 30 tablet 11   cloNIDine  (CATAPRES ) 0.3 MG tablet Take 1 tablet by mouth 3 times a day 90 tablet 11   Continuous Glucose Sensor (DEXCOM G7 SENSOR) MISC Use 1 sensor for continuous glucose monitoring every 10 days 30 days 3 each 5   fluticasone  (FLONASE ) 50 MCG/ACT nasal spray Shake liquid & use 1 spray into each nostril every day as needed for allergies 16 g 11   furosemide  (LASIX ) 80 MG tablet Take 1 tablet by mouth every day 30 tablet 11   Incontinence Supply Disposable (DEPEND FIT-FLEX-WOMEN-M) MISC Use as directed      Incontinence Supply Disposable (DEPEND UNDERWEAR LARGE) MISC UAD for incontinence 90 each 11   Insulin  Pen Needle (EASY TOUCH PEN NEEDLES) 32G X 5 MM MISC USE AS DIRECTED DAILY FOR  INSULIN  100 each 2   KLOR-CON  M10 10 MEQ tablet Take 2 tablets by mouth every day 60 tablet 11   losartan  (COZAAR ) 50 MG tablet Take 1 tablet by mouth twice daily 60 tablet 11   metoprolol  (TOPROL -XL) 200 MG 24 hr tablet Take 1 tablet by mouth twice daily 60 tablet 11   MOUNJARO  7.5 MG/0.5ML Pen      NARCAN 4 MG/0.1ML LIQD nasal spray kit Place 1 spray into the nose once.     nitroGLYCERIN  (NITROSTAT ) 0.4 MG SL tablet PLACE 1 TABLET UNDER THE TONGUE EVERY 5 MINS AS NEEDED FOR CHEST PAIN 25 tablet 2   oxyCODONE -acetaminophen  (PERCOCET) 10-325 MG tablet Take 1 tablet by mouth every 6 (six) hours as needed. 120 tablet 0   pantoprazole  (PROTONIX ) 40 MG tablet Take 1 tablet by mouth twice daily 60 tablet 11   tirzepatide  (MOUNJARO ) 5 MG/0.5ML Pen Inject 5 mg into the skin once a week. (Patient taking differently: Inject 5 mg into the skin every Sunday.) 2 mL 5   tiZANidine  (ZANAFLEX ) 4 MG tablet Take 1 tablet by mouth 3 times a day 90 tablet 5   ZTLIDO  1.8 % PTCH Apply  3 patches topically daily as needed (shoulder/back pain.).     amitriptyline  (ELAVIL ) 25 MG tablet Take 50 mg  nightly for 2 weeks then 25 mg nightly for 2 weeks then stop 28 tablet 0   No current facility-administered medications on file prior to visit.    Review of Systems  Constitutional:  Negative for fever.  Eyes:  Negative for visual disturbance.  Respiratory:  Positive for cough (from smoking) and shortness of breath (with panic attacks). Negative for wheezing.   Cardiovascular:  Positive for leg swelling. Negative for chest pain and palpitations.  Gastrointestinal:  Positive for constipation (medication induced - iron ). Negative for abdominal pain, blood in stool and diarrhea.       No gerd - controlled  Genitourinary:  Negative for dysuria.   Musculoskeletal:  Positive for arthralgias and back pain.  Skin:  Negative for rash.  Neurological:  Negative for light-headedness and headaches.       Objective:   Vitals:   02/22/24 1111  BP: 126/70  Pulse: 70  Temp: 98.3 F (36.8 C)  SpO2: 93%   There were no vitals filed for this visit. Body mass index is 44.22 kg/m.  BP Readings from Last 3 Encounters:  02/22/24 126/70  02/17/24 120/84  12/20/23 99/65    Wt Readings from Last 3 Encounters:  12/20/23 274 lb (124.3 kg)  11/23/23 250 lb (113.4 kg)  10/18/23 276 lb (125.2 kg)       Physical Exam Constitutional: She appears well-developed and well-nourished. No distress.  HENT:  Head: Normocephalic and atraumatic.  Right Ear: External ear normal.  Left Ear: External ear normal.    Mouth/Throat: Oropharynx is clear and moist.  Eyes: Conjunctivae normal.  Neck: Neck supple. No tracheal deviation present. No thyromegaly present.  No carotid bruit  Cardiovascular: Normal rate, regular rhythm and normal heart sounds.   No murmur heard.  Mild bilateral lower extremity edema. Pulmonary/Chest: Effort normal and breath sounds normal. No respiratory distress. She has no wheezes. She has no rales.  Abdominal: Soft. She exhibits no distension. There is no tenderness.  Lymphadenopathy: She has no cervical adenopathy.  Skin: Skin is warm and dry. She is not diaphoretic.  Psychiatric: She has a normal mood and affect. Her behavior is normal.     Lab Results  Component Value Date   WBC 9.6 11/23/2023   HGB 10.1 (L) 11/23/2023   HCT 33.1 (L) 11/23/2023   PLT 362.0 11/23/2023   GLUCOSE 228 (H) 11/23/2023   CHOL 116 11/23/2023   TRIG 116.0 11/23/2023   HDL 47.60 11/23/2023   LDLDIRECT 101.0 09/19/2019   LDLCALC 45 11/23/2023   ALT 16 11/23/2023   AST 34 11/23/2023   NA 136 11/23/2023   K 4.0 11/23/2023   CL 95 (L) 11/23/2023   CREATININE 0.60 11/23/2023   BUN 8 11/23/2023   CO2 32 11/23/2023   TSH 5.02  01/07/2021   INR 1.06 11/07/2015   HGBA1C 8.2 (H) 11/23/2023   MICROALBUR <0.7 11/23/2023         Assessment & Plan:      See Problem List for Assessment and Plan of chronic medical problems.

## 2024-02-21 NOTE — Assessment & Plan Note (Signed)
 Chronic Associated with peripheral neuropathy Following with endocrine Lab Results  Component Value Date   HGBA1C 8.2 (H) 11/23/2023   A1c today Continue Mounjaro  to 5 mg weekly-advised her to discuss with endocrine possibly increasing Mounjaro  On toujeo  150 units twice daily Stressed diabetic diet and eating regularly

## 2024-02-22 ENCOUNTER — Ambulatory Visit (HOSPITAL_COMMUNITY)
Admission: RE | Admit: 2024-02-22 | Discharge: 2024-02-22 | Disposition: A | Discharge status: Home / Self Care | Source: Ambulatory Visit | Attending: Cardiovascular Disease | Admitting: Cardiovascular Disease

## 2024-02-22 ENCOUNTER — Ambulatory Visit: Admitting: Internal Medicine

## 2024-02-22 VITALS — BP 126/70 | HR 70 | Temp 98.3°F | Ht 66.0 in

## 2024-02-22 DIAGNOSIS — F3289 Other specified depressive episodes: Secondary | ICD-10-CM

## 2024-02-22 DIAGNOSIS — F419 Anxiety disorder, unspecified: Secondary | ICD-10-CM | POA: Diagnosis not present

## 2024-02-22 DIAGNOSIS — E1142 Type 2 diabetes mellitus with diabetic polyneuropathy: Secondary | ICD-10-CM

## 2024-02-22 DIAGNOSIS — E1169 Type 2 diabetes mellitus with other specified complication: Secondary | ICD-10-CM

## 2024-02-22 DIAGNOSIS — I152 Hypertension secondary to endocrine disorders: Secondary | ICD-10-CM

## 2024-02-22 DIAGNOSIS — I7121 Aneurysm of the ascending aorta, without rupture: Secondary | ICD-10-CM | POA: Diagnosis present

## 2024-02-22 DIAGNOSIS — E1159 Type 2 diabetes mellitus with other circulatory complications: Secondary | ICD-10-CM

## 2024-02-22 DIAGNOSIS — Z01818 Encounter for other preprocedural examination: Secondary | ICD-10-CM | POA: Diagnosis not present

## 2024-02-22 DIAGNOSIS — F17210 Nicotine dependence, cigarettes, uncomplicated: Secondary | ICD-10-CM

## 2024-02-22 DIAGNOSIS — G6289 Other specified polyneuropathies: Secondary | ICD-10-CM

## 2024-02-22 DIAGNOSIS — E785 Hyperlipidemia, unspecified: Secondary | ICD-10-CM | POA: Diagnosis not present

## 2024-02-22 LAB — POCT I-STAT CREATININE: Creatinine, Ser: 0.7 mg/dL (ref 0.44–1.00)

## 2024-02-22 LAB — POCT GLYCOSYLATED HEMOGLOBIN (HGB A1C)
HbA1c POC (<> result, manual entry): 6.7 % (ref 4.0–5.6)
HbA1c, POC (controlled diabetic range): 6.7 % (ref 0.0–7.0)
HbA1c, POC (prediabetic range): 6.7 % — AB (ref 5.7–6.4)
Hemoglobin A1C: 6.7 % — AB (ref 4.0–5.6)

## 2024-02-22 MED ORDER — IOHEXOL 350 MG/ML SOLN
100.0000 mL | Freq: Once | INTRAVENOUS | Status: AC | PRN
  Administered 2024-02-22: 100 mL via INTRAVENOUS

## 2024-02-22 MED ORDER — BUPROPION HCL ER (SR) 150 MG PO TB12: 150.0000 mg | ORAL_TABLET | Freq: Two times a day (BID) | ORAL | 5 refills | Status: DC

## 2024-02-22 NOTE — Patient Instructions (Addendum)
       Medications changes include :   None       Return in about 3 months (around 05/24/2024) for follow up.

## 2024-02-22 NOTE — Assessment & Plan Note (Signed)
Chronic Regular exercise and healthy diet encouraged Continue atorvastatin 20 mg daily 

## 2024-02-22 NOTE — Assessment & Plan Note (Signed)
 Chronic Was on amitriptyline  and gabapentin -currently not on either

## 2024-02-22 NOTE — Assessment & Plan Note (Signed)
 Chronic Blood pressure controlled Continue losartan 50 mg twice daily, metoprolol XL 200 mg twice daily, clonidine 0.3 mg 3 times daily

## 2024-02-22 NOTE — Assessment & Plan Note (Signed)
 Chronic Working on smoking cessation She has decreased her smoking some Wellbutrin  150 mg XL daily helped slightly initially, but does not seem to be helping-Will change to Wellbutrin  SR 150 twice daily She has upcoming surgery and she knows she has to quit for surgery and will use the patch following to help prevent starting again

## 2024-02-22 NOTE — Assessment & Plan Note (Signed)
 Chronic generalized anxiety with panic attacks Fairly controlled

## 2024-02-22 NOTE — Assessment & Plan Note (Signed)
 Chronic  Controlled Continue bupropion -changed to 150 mg twice daily which will hopefully help more with smoking cessation

## 2024-02-22 NOTE — Assessment & Plan Note (Signed)
 Here for clearance for right total knee arthroplasty Chronic medical problems stable Hypertension controlled Sugars much improved over the past year A1c today - 6.7% No concerning symptoms suggestive of angina CAD, COPD controlled  Low risk for low risk surgery - cleared for surgery.

## 2024-03-06 ENCOUNTER — Ambulatory Visit

## 2024-03-06 VITALS — BP 130/87 | HR 62 | Resp 18 | Ht 66.0 in | Wt 268.0 lb

## 2024-03-06 DIAGNOSIS — I7121 Aneurysm of the ascending aorta, without rupture: Secondary | ICD-10-CM | POA: Diagnosis not present

## 2024-03-06 NOTE — Patient Instructions (Signed)

## 2024-03-06 NOTE — Progress Notes (Signed)
 [address] [place] [address]             [phone]            Tammy Strickland [id number] May 26, 1960   History of Present Illness:  Tammy Strickland is a 63 year old female with medical history of chronic diastolic congestive heart failure, hypertension, CAD, TIA, COPD, OSA, GERD, type 2 diabetes, peripheral neuropathy, chronic pain syndrome, fibromyalgia, osteoarthritis, current toboacco use, and hyperlipidemia who presents for continued follow-up of ascending thoracic aortic aneurysm.  Aneurysm has stayed stable in size and on recent CTA of chest measured 4.0 cm.  Echocardiogram from 2017 showed trileaflet aortic valve.  She presents to the clinic today and reports that she is doing well.  Her blood pressure is well-controlled with current medication therapy.  She does check her blood pressure at home and readings are 120s- 130s/80s.  She is smoking half a pack of cigarettes daily.  She is attempting to quit currently.  She denies chest pain, shortness of breath and lower leg swelling.     Current Outpatient Medications on File Prior to Visit  Medication Sig Dispense Refill   albuterol  (PROVENTIL ) (2.5 MG/3ML) 0.083% nebulizer solution Take 3 mLs (2.5 mg total) by nebulization every 6 (six) hours as needed for wheezing or shortness of breath. 150 mL 1   albuterol  (VENTOLIN  HFA) 108 (90 Base) MCG/ACT inhaler Inhale 2 puffs into the lungs every 6 (six) hours as needed for wheezing or shortness of breath. 8 g 8   amLODipine  (NORVASC ) 5 MG tablet Take 1 tablet (5 mg total) by mouth daily. 90 tablet 1   aspirin  EC 81 MG tablet Take 1 tablet (81 mg total) by mouth daily.     atorvastatin  (LIPITOR) 20 MG tablet Take 1 tablet by mouth every day 30 tablet 11   buPROPion  (WELLBUTRIN  SR) 150 MG 12 hr tablet Take 1 tablet (150 mg total) by mouth 2 (two) times daily. 60 tablet 5   cloNIDine  (CATAPRES ) 0.3 MG tablet Take 1 tablet by mouth 3 times a day 90 tablet  11   Continuous Glucose Sensor (DEXCOM G7 SENSOR) MISC Use 1 sensor for continuous glucose monitoring every 10 days 30 days 3 each 5   fluticasone  (FLONASE ) 50 MCG/ACT nasal spray Shake liquid & use 1 spray into each nostril every day as needed for allergies 16 g 11   furosemide  (LASIX ) 80 MG tablet Take 1 tablet by mouth every day 30 tablet 11   Incontinence Supply Disposable (DEPEND FIT-FLEX-WOMEN-M) MISC Use as directed     Incontinence Supply Disposable (DEPEND UNDERWEAR LARGE) MISC UAD for incontinence 90 each 11   insulin  regular human CONCENTRATED (HUMULIN R  U-500 KWIKPEN) 500 UNIT/ML KwikPen Inject 150 Units into the skin 2 (two) times daily before a meal.     KLOR-CON  M10 10 MEQ tablet Take 2 tablets by mouth every day 60 tablet 11   losartan  (COZAAR ) 50 MG tablet Take 1 tablet by mouth twice daily 60 tablet 11   metoprolol  (TOPROL -XL) 200 MG 24 hr tablet Take 1 tablet by mouth twice daily 60 tablet 11   MOUNJARO  7.5 MG/0.5ML Pen      NARCAN 4 MG/0.1ML LIQD nasal spray kit Place 1 spray into the nose once.     nitroGLYCERIN  (NITROSTAT ) 0.4 MG SL tablet PLACE 1 TABLET UNDER THE TONGUE EVERY 5 MINS AS NEEDED FOR CHEST PAIN 25 tablet 2  oxyCODONE -acetaminophen  (PERCOCET) 10-325 MG tablet Take 1 tablet by mouth every 6 (six) hours as needed. 120 tablet 0   pantoprazole  (PROTONIX ) 40 MG tablet Take 1 tablet by mouth twice daily 60 tablet 11   tiZANidine  (ZANAFLEX ) 4 MG tablet Take 1 tablet by mouth 3 times a day 90 tablet 5   ZTLIDO  1.8 % PTCH Apply 3 patches topically daily as needed (shoulder/back pain.).     No current facility-administered medications on file prior to visit.     ROS: Review of Systems  Constitutional:  Negative for malaise/fatigue.  Respiratory:  Negative for cough and shortness of breath.   Cardiovascular: Negative.  Negative for chest pain, palpitations and leg swelling.  Musculoskeletal:  Positive for joint pain.     BP 130/87 (BP Location: Right Arm)    Pulse 62   Resp 18   Ht 5' 6 (1.676 m)   Wt 268 lb (121.6 kg)   SpO2 94% Comment: RA  BMI 43.26 kg/m   Physical Exam Constitutional:      Appearance: Normal appearance.  HENT:     Head: Normocephalic and atraumatic.  Cardiovascular:     Rate and Rhythm: Normal rate and regular rhythm.     Heart sounds: Normal heart sounds, S1 normal and S2 normal.  Pulmonary:     Effort: Pulmonary effort is normal.     Breath sounds: Normal breath sounds.  Musculoskeletal:     Cervical back: Normal range of motion.  Skin:    General: Skin is warm and dry.  Neurological:     General: No focal deficit present.     Mental Status: She is alert.      Imaging: CLINICAL DATA:  Aortic aneurysm suspected.   EXAM: CT ANGIOGRAPHY CHEST WITH CONTRAST   TECHNIQUE: Multidetector CT imaging of the chest was performed using the standard protocol during bolus administration of intravenous contrast. Multiplanar CT image reconstructions and MIPs were obtained to evaluate the vascular anatomy.   RADIATION DOSE REDUCTION: This exam was performed according to the departmental dose-optimization program which includes automated exposure control, adjustment of the mA and/or kV according to patient size and/or use of iterative reconstruction technique.   CONTRAST:  OMNIPAQUE  IOHEXOL  350 MG/ML SOLN   COMPARISON:  CT dated 03/03/2023.   FINDINGS: Cardiovascular: Borderline cardiomegaly. No pericardial effusion. Stable mild dilatation of the ascending aorta measuring up to 4 cm in caliber. Newly aortic dissection. The origins of the great vessels of the aortic arch are patent. No pulmonary artery embolus identified.   Mediastinum/Nodes: Right hilar adenopathy measures 4 cm in short axis. The esophagus is grossly unremarkable. No mediastinal fluid collection. Right thyroid  nodule as seen previously. This has been evaluated on previous imaging. (ref: J Am Coll Radiol. 2015 Feb;12(2): 143-50).No  mediastinal fluid collection.   Lungs/Pleura: No focal consolidation, pleural effusion, pneumothorax. The central airways are patent.   Upper Abdomen: Cholecystectomy.   Musculoskeletal: Degenerative changes of the spine. No acute osseous pathology.   Review of the MIP images confirms the above findings.   IMPRESSION: 1. No acute intrathoracic pathology. No pulmonary artery embolus. 2. Stable mild dilatation of the ascending aorta measuring up to 4 cm in caliber. Recommend annual imaging followup by CTA or MRA. This recommendation follows 2010 ACCF/AHA/AATS/ACR/ASA/SCA/SCAI/SIR/STS/SVM Guidelines for the Diagnosis and Management of Patients with Thoracic Aortic Disease. Circulation. 2010; 121: [id number]. Aortic aneurysm NOS (ICD10-I71.9) 3. Right hilar adenopathy.     Electronically Signed   By: Vanetta Shelia HERO.D.  On: 02/22/2024 13:24     A/P: Aneurysm of ascending aorta without rupture -4.0 cm ascending thoracic aortic aneurysm on CTA of chest.  -We discussed the natural history and and risk factors for growth of ascending aortic aneurysms. Discussed recommendations to minimize the risk of further expansion or dissection including careful blood pressure control, avoidance of contact sports and heavy lifting, attention to lipid management.  We covered the importance of smoking cessation.  The patient does not yet meet surgical criteria of >5.5cm. The patient is aware of signs and symptoms of aortic dissection and when to present to the emergency department   -Follow up in one year with CTA of chest for continued surveillance    Risk Modification:  Statin:  atorvastatin    Smoking cessation instruction/counseling given:  counseled patient on the dangers of tobacco use, advised patient to stop smoking, and reviewed strategies to maximize success. Attempting to quit currently with Wellbutrin .    Patient was counseled on importance of Blood Pressure Control  They are  instructed to contact their Primary Care Physician if they start to have blood pressure readings over 130s/90s. Do not ever stop blood pressure medications on your own, unless instructed by healthcare professional.  Please avoid use of Fluoroquinolones as this can potentially increase your risk of Aortic Rupture and/or Dissection  Patient educated on signs and symptoms of Aortic Dissection, handout also provided in AVS  Manuelita CHRISTELLA Rough, PA-C 03/06/24

## 2024-03-19 ENCOUNTER — Other Ambulatory Visit: Payer: Self-pay | Admitting: Internal Medicine

## 2024-03-21 ENCOUNTER — Other Ambulatory Visit: Payer: Self-pay | Admitting: Internal Medicine

## 2024-03-31 ENCOUNTER — Other Ambulatory Visit: Payer: Self-pay | Admitting: Internal Medicine

## 2024-04-05 ENCOUNTER — Other Ambulatory Visit: Payer: Self-pay | Admitting: Physical Medicine and Rehabilitation

## 2024-04-18 ENCOUNTER — Telehealth: Payer: Self-pay | Admitting: *Deleted

## 2024-04-18 ENCOUNTER — Emergency Department (HOSPITAL_COMMUNITY)

## 2024-04-18 ENCOUNTER — Encounter (HOSPITAL_COMMUNITY): Payer: Self-pay

## 2024-04-18 ENCOUNTER — Encounter: Attending: Registered Nurse | Admitting: Registered Nurse

## 2024-04-18 ENCOUNTER — Other Ambulatory Visit: Payer: Self-pay

## 2024-04-18 ENCOUNTER — Emergency Department (HOSPITAL_COMMUNITY)
Admission: EM | Admit: 2024-04-18 | Discharge: 2024-04-18 | Disposition: A | Discharge status: Home / Self Care | Attending: Emergency Medicine | Admitting: Emergency Medicine

## 2024-04-18 DIAGNOSIS — M47816 Spondylosis without myelopathy or radiculopathy, lumbar region: Secondary | ICD-10-CM | POA: Diagnosis not present

## 2024-04-18 DIAGNOSIS — M797 Fibromyalgia: Secondary | ICD-10-CM | POA: Diagnosis not present

## 2024-04-18 DIAGNOSIS — E871 Hypo-osmolality and hyponatremia: Secondary | ICD-10-CM | POA: Diagnosis not present

## 2024-04-18 DIAGNOSIS — G8929 Other chronic pain: Secondary | ICD-10-CM | POA: Insufficient documentation

## 2024-04-18 DIAGNOSIS — M160 Bilateral primary osteoarthritis of hip: Secondary | ICD-10-CM | POA: Diagnosis not present

## 2024-04-18 DIAGNOSIS — M545 Low back pain, unspecified: Secondary | ICD-10-CM | POA: Insufficient documentation

## 2024-04-18 DIAGNOSIS — G894 Chronic pain syndrome: Secondary | ICD-10-CM | POA: Insufficient documentation

## 2024-04-18 DIAGNOSIS — R8290 Unspecified abnormal findings in urine: Secondary | ICD-10-CM | POA: Diagnosis not present

## 2024-04-18 DIAGNOSIS — Z7982 Long term (current) use of aspirin: Secondary | ICD-10-CM | POA: Insufficient documentation

## 2024-04-18 DIAGNOSIS — Z79891 Long term (current) use of opiate analgesic: Secondary | ICD-10-CM | POA: Insufficient documentation

## 2024-04-18 DIAGNOSIS — Z5181 Encounter for therapeutic drug level monitoring: Secondary | ICD-10-CM | POA: Insufficient documentation

## 2024-04-18 DIAGNOSIS — M7061 Trochanteric bursitis, right hip: Secondary | ICD-10-CM | POA: Insufficient documentation

## 2024-04-18 DIAGNOSIS — Z794 Long term (current) use of insulin: Secondary | ICD-10-CM | POA: Insufficient documentation

## 2024-04-18 DIAGNOSIS — G629 Polyneuropathy, unspecified: Secondary | ICD-10-CM | POA: Insufficient documentation

## 2024-04-18 DIAGNOSIS — M1711 Unilateral primary osteoarthritis, right knee: Secondary | ICD-10-CM | POA: Insufficient documentation

## 2024-04-18 DIAGNOSIS — D649 Anemia, unspecified: Secondary | ICD-10-CM | POA: Insufficient documentation

## 2024-04-18 DIAGNOSIS — M1712 Unilateral primary osteoarthritis, left knee: Secondary | ICD-10-CM | POA: Insufficient documentation

## 2024-04-18 DIAGNOSIS — M7062 Trochanteric bursitis, left hip: Secondary | ICD-10-CM | POA: Insufficient documentation

## 2024-04-18 DIAGNOSIS — M2550 Pain in unspecified joint: Secondary | ICD-10-CM | POA: Insufficient documentation

## 2024-04-18 DIAGNOSIS — M47814 Spondylosis without myelopathy or radiculopathy, thoracic region: Secondary | ICD-10-CM | POA: Diagnosis not present

## 2024-04-18 LAB — URINALYSIS, W/ REFLEX TO CULTURE (INFECTION SUSPECTED)
Bilirubin Urine: NEGATIVE
Glucose, UA: NEGATIVE mg/dL
Ketones, ur: NEGATIVE mg/dL
Leukocytes,Ua: NEGATIVE
Nitrite: NEGATIVE
Protein, ur: 100 mg/dL — AB
Specific Gravity, Urine: 1.026 (ref 1.005–1.030)
pH: 5 (ref 5.0–8.0)

## 2024-04-18 LAB — CBC WITH DIFFERENTIAL/PLATELET
Abs Immature Granulocytes: 0.02 K/uL (ref 0.00–0.07)
Basophils Absolute: 0 K/uL (ref 0.0–0.1)
Basophils Relative: 1 %
Eosinophils Absolute: 0.1 K/uL (ref 0.0–0.5)
Eosinophils Relative: 2 %
HCT: 36.3 % (ref 36.0–46.0)
Hemoglobin: 11.5 g/dL — ABNORMAL LOW (ref 12.0–15.0)
Immature Granulocytes: 0 %
Lymphocytes Relative: 39 %
Lymphs Abs: 2.5 K/uL (ref 0.7–4.0)
MCH: 25.4 pg — ABNORMAL LOW (ref 26.0–34.0)
MCHC: 31.7 g/dL (ref 30.0–36.0)
MCV: 80.3 fL (ref 80.0–100.0)
Monocytes Absolute: 0.5 K/uL (ref 0.1–1.0)
Monocytes Relative: 8 %
Neutro Abs: 3.2 K/uL (ref 1.7–7.7)
Neutrophils Relative %: 50 %
Platelets: 275 K/uL (ref 150–400)
RBC: 4.52 MIL/uL (ref 3.87–5.11)
RDW: 17.5 % — ABNORMAL HIGH (ref 11.5–15.5)
WBC: 6.4 K/uL (ref 4.0–10.5)
nRBC: 0 % (ref 0.0–0.2)

## 2024-04-18 LAB — COMPREHENSIVE METABOLIC PANEL WITH GFR
ALT: 29 U/L (ref 0–44)
AST: 84 U/L — ABNORMAL HIGH (ref 15–41)
Albumin: 3.8 g/dL (ref 3.5–5.0)
Alkaline Phosphatase: 138 U/L — ABNORMAL HIGH (ref 38–126)
Anion gap: 14 (ref 5–15)
BUN: 11 mg/dL (ref 8–23)
CO2: 23 mmol/L (ref 22–32)
Calcium: 8.7 mg/dL — ABNORMAL LOW (ref 8.9–10.3)
Chloride: 98 mmol/L (ref 98–111)
Creatinine, Ser: 1.13 mg/dL — ABNORMAL HIGH (ref 0.44–1.00)
GFR, Estimated: 55 mL/min — ABNORMAL LOW
Glucose, Bld: 214 mg/dL — ABNORMAL HIGH (ref 70–99)
Potassium: 4 mmol/L (ref 3.5–5.1)
Sodium: 134 mmol/L — ABNORMAL LOW (ref 135–145)
Total Bilirubin: 0.3 mg/dL (ref 0.0–1.2)
Total Protein: 7.7 g/dL (ref 6.5–8.1)

## 2024-04-18 MED ORDER — HYDROMORPHONE HCL 1 MG/ML IJ SOLN: 1.0000 mg | Freq: Once | INTRAMUSCULAR | Status: DC

## 2024-04-18 MED ORDER — LIDOCAINE 5 % EX PTCH
1.0000 | MEDICATED_PATCH | CUTANEOUS | Status: DC
  Administered 2024-04-18: 1 via TRANSDERMAL
  Filled 2024-04-18: qty 1

## 2024-04-18 MED ORDER — BACLOFEN 10 MG PO TABS
10.0000 mg | ORAL_TABLET | ORAL | Status: AC
  Administered 2024-04-18: 10 mg via ORAL
  Filled 2024-04-18: qty 1

## 2024-04-18 MED ORDER — OXYCODONE-ACETAMINOPHEN 5-325 MG PO TABS
1.0000 | ORAL_TABLET | Freq: Once | ORAL | Status: AC
  Administered 2024-04-18: 1 via ORAL
  Filled 2024-04-18: qty 1

## 2024-04-18 MED ORDER — HYDROMORPHONE HCL 1 MG/ML IJ SOLN
0.5000 mg | Freq: Once | INTRAMUSCULAR | Status: AC
  Administered 2024-04-18: 0.5 mg via INTRAMUSCULAR
  Filled 2024-04-18: qty 1

## 2024-04-18 NOTE — ED Notes (Signed)
 Pt attempt to ambulate with walker, able to stand with use of FWW but unable to ambulate at this time.

## 2024-04-18 NOTE — ED Notes (Signed)
 Pt brought to bathroom via WC, pt 2 assist to transfer d/t pain. Voided approx 100 ml

## 2024-04-18 NOTE — ED Notes (Signed)
 Pt transferred from [facility] to stretcher w/ assist of 4

## 2024-04-18 NOTE — ED Provider Notes (Signed)
 "  EMERGENCY DEPARTMENT AT [facility] Provider Note   CSN: [id number] Arrival date & time: 04/18/24  9191     Patient presents with: Back Pain   Tammy Strickland is a 64 y.o. female with history of chronic pain syndrome, fibromyalgia, peripheral neuropathy, degenerative disc disease, generative joint disease presents to the emerged from today for evaluation of worsening lower back pain.  Patient reports that she woke up this morning and had worsening acute on chronic back pain.  This is in the same location that her chronic pain usually is in.  Denies any radiation in upper back or into her lower legs.  She denies any fever, IV drug use, saddle anesthesia, or any urinary fecal incontinence.  She denies any difficulty with urinating or having a bowel movement as well.  She denies any abdominal pain, chest pain, or any worsening shortness of breath.  She has not trialed any of her medications for this symptom.  Denies any falls or traumas.  She denies any new numbness or tingling into her lower extremities.  Denies any dysuria or hematuria.   Back Pain Associated symptoms: no chest pain, no dysuria, no fever, no numbness and no weakness        Prior to Admission medications  Medication Sig Start Date End Date Taking? Authorizing Provider  albuterol  (PROVENTIL ) (2.5 MG/3ML) 0.083% nebulizer solution Take 3 mLs (2.5 mg total) by nebulization every 6 (six) hours as needed for wheezing or shortness of breath. 04/23/23   Geofm Glade PARAS, MD  albuterol  (VENTOLIN  HFA) 108 (90 Base) MCG/ACT inhaler Inhale 2 puffs into the lungs every 6 (six) hours as needed for wheezing or shortness of breath. 10/01/20   Burns, Glade PARAS, MD  amLODipine  (NORVASC ) 5 MG tablet TAKE 1 TABLET BY MOUTH EVERY DAY 03/20/24   Geofm Glade PARAS, MD  aspirin  EC 81 MG tablet Take 1 tablet (81 mg total) by mouth daily. 11/01/15   Elgergawy, Brayton RAMAN, MD  atorvastatin  (LIPITOR) 20 MG tablet Take 1 tablet by mouth every day  03/22/24   Geofm Glade PARAS, MD  buPROPion  (WELLBUTRIN  SR) 150 MG 12 hr tablet Take 1 tablet (150 mg total) by mouth 2 (two) times daily. 02/22/24   Geofm Glade PARAS, MD  cloNIDine  (CATAPRES ) 0.3 MG tablet Take 1 tablet by mouth 3 times a day 01/31/24   Geofm Glade PARAS, MD  Continuous Glucose Sensor (DEXCOM G7 SENSOR) MISC Use 1 sensor for continuous glucose monitoring every 10 days 30 days 09/28/22   Braulio Hough, MD  fluticasone  (FLONASE ) 50 MCG/ACT nasal spray Shake liquid & use 1 spray into each nostril every day as needed for allergies 12/15/22   Geofm Glade PARAS, MD  furosemide  (LASIX ) 80 MG tablet Take 1 tablet by mouth every day 10/18/23   Geofm Glade PARAS, MD  Incontinence Supply Disposable (DEPEND FIT-FLEX-WOMEN-M) MISC Use as directed 04/23/23   Geofm Glade PARAS, MD  Incontinence Supply Disposable (DEPEND UNDERWEAR LARGE) MISC UAD for incontinence 04/27/23   Geofm Glade PARAS, MD  insulin  regular human CONCENTRATED (HUMULIN R  U-500 KWIKPEN) 500 UNIT/ML KwikPen Inject 150 Units into the skin 2 (two) times daily before a meal. 12/23/23   [provider]  KLOR-CON  M10 10 MEQ tablet Take 2 tablets by mouth every day 10/18/23   Geofm Glade PARAS, MD  losartan  (COZAAR ) 50 MG tablet Take 1 tablet by mouth twice daily 10/18/23   Geofm Glade PARAS, MD  metoprolol  (TOPROL -XL) 200 MG 24 hr tablet  Take 1 tablet by mouth twice daily 10/18/23   Geofm Glade PARAS, MD  MOUNJARO  7.5 MG/0.5ML Pen  02/21/24   [provider]  NARCAN 4 MG/0.1ML LIQD nasal spray kit Place 1 spray into the nose once. 11/17/19   [provider]  nitroGLYCERIN  (NITROSTAT ) 0.4 MG SL tablet PLACE 1 TABLET UNDER THE TONGUE EVERY 5 MINS AS NEEDED FOR CHEST PAIN 09/16/23   Geofm Glade PARAS, MD  oxyCODONE -acetaminophen  (PERCOCET) 10-325 MG tablet Take 1 tablet by mouth every 6 (six) hours as needed. 02/17/24   Debby Fidela CROME, NP  pantoprazole  (PROTONIX ) 40 MG tablet Take 1 tablet by mouth twice daily 03/31/24   Geofm Glade PARAS, MD  tiZANidine   (ZANAFLEX ) 4 MG tablet Take 1 tablet by mouth 3 times a day 04/05/24   Emeline Search C, DO  ZTLIDO  1.8 % PTCH Apply 3 patches topically daily as needed (shoulder/back pain.). 06/05/20   [provider]    Allergies: Bacitracin-polymyxin b , Metformin , Metformin  and related, Other, Ozempic  (0.25 or 0.5 mg-dose) [semaglutide (0.25 or 0.5mg -dos)], Quinolones, and Bacitracin    Review of Systems  Constitutional:  Negative for chills and fever.  Respiratory:  Negative for shortness of breath.   Cardiovascular:  Negative for chest pain.  Gastrointestinal:  Negative for abdominal distention, nausea and vomiting.       Denies any fecal incontinence  Genitourinary:  Negative for dysuria and hematuria.       Denies any urinary incontinence or urinary retention.  Musculoskeletal:  Positive for back pain.       Denies any saddle anesthesia  Neurological:  Negative for weakness and numbness.    Updated Vital Signs BP 121/80 (BP Location: Right Arm)   Pulse 63   Temp 97.7 F (36.5 C) (Oral)   Resp 18   SpO2 94%   Physical Exam Vitals and nursing note reviewed.  Constitutional:      General: She is not in acute distress.    Appearance: She is not toxic-appearing.     Comments: Sleeping but awakens to touch.  Eyes:     General: No scleral icterus. Cardiovascular:     Rate and Rhythm: Normal rate.     Pulses:          Radial pulses are 2+ on the right side and 2+ on the left side.       Dorsalis pedis pulses are 2+ on the right side and 2+ on the left side.       Posterior tibial pulses are 2+ on the right side and 2+ on the left side.  Pulmonary:     Effort: Pulmonary effort is normal. No respiratory distress.  Abdominal:     Palpations: Abdomen is soft.     Tenderness: There is no abdominal tenderness. There is no guarding or rebound.  Musculoskeletal:       Back:     Right lower leg: No edema.     Left lower leg: No edema.     Comments: Diffuse lower back tenderness to  palpation.  No specific midline tenderness.  Does have some weakness to the lower extremities however is symmetric.  Sensation reportedly intact and symmetric per patient.  Palpable DP and PT pulses.  Legs appear symmetric in size, coloration, and feel symmetric and temperature.  Does have pain with movement of her legs as well as tenderness upon palpation to the marked area above.  Skin:    General: Skin is warm and dry.     (  all labs ordered are listed, but only abnormal results are displayed) Labs Reviewed  CBC WITH DIFFERENTIAL/PLATELET - Abnormal; Notable for the following components:      Result Value   Hemoglobin 11.5 (*)    MCH 25.4 (*)    RDW 17.5 (*)    All other components within normal limits  COMPREHENSIVE METABOLIC PANEL WITH GFR - Abnormal; Notable for the following components:   Sodium 134 (*)    Glucose, Bld 214 (*)    Creatinine, Ser 1.13 (*)    Calcium  8.7 (*)    AST 84 (*)    Alkaline Phosphatase 138 (*)    GFR, Estimated 55 (*)    All other components within normal limits  URINALYSIS, W/ REFLEX TO CULTURE (INFECTION SUSPECTED)    EKG: None  Radiology: DG Pelvis 1-2 Views Result Date: 04/18/2024 CLINICAL DATA:  Low back pain beginning today. EXAM: PELVIS - 1-2 VIEW COMPARISON:  None Available. FINDINGS: Tubular density over the left lower quadrant likely artifact. This is not seen on today's lumbar spine images. Minimal symmetric degenerative change of the hips. No acute fracture or dislocation. Mild degenerate change of the spine. Remaining soft tissues are unremarkable. IMPRESSION: 1. No acute findings. 2. Minimal symmetric degenerative change of the hips. Electronically Signed   By: Toribio Agreste M.D.   On: 04/18/2024 09:54   DG Lumbar Spine Complete Result Date: 04/18/2024 CLINICAL DATA:  Low back pain suddenly today. EXAM: LUMBAR SPINE - COMPLETE 4+ VIEW COMPARISON:  08/30/2018 FINDINGS: Vertebral body alignment and heights are normal. There is mild  spondylosis of the lumbar spine to include facet arthropathy. Moderate spondylosis of the visualized lower thoracic spine. Disc space heights are maintained. No compression fracture or spondylolisthesis/spondylolysis. IMPRESSION: 1. No acute findings. 2. Mild spondylosis of the lumbar spine. Electronically Signed   By: Toribio Agreste M.D.   On: 04/18/2024 09:52   Procedures   Medications Ordered in the ED  lidocaine  (LIDODERM ) 5 % 1 patch (1 patch Transdermal Patch Applied 04/18/24 1106)  oxyCODONE -acetaminophen  (PERCOCET/ROXICET) 5-325 MG per tablet 1 tablet (1 tablet Oral Given 04/18/24 0859)  oxyCODONE -acetaminophen  (PERCOCET/ROXICET) 5-325 MG per tablet 1 tablet (1 tablet Oral Given 04/18/24 1320)  baclofen  (LIORESAL ) tablet 10 mg (10 mg Oral Given 04/18/24 1320)   Medical Decision Making Risk Prescription drug management.   64 y.o. female presents to the ER for evaluation of back pain. Differential diagnosis includes but is not limited to Fracture (acute/chronic), muscle strain, cauda equina, spinal stenosis, DDD, metastatic cancer, vertebral osteomyelitis, kidney stone, pyelonephritis, AAA, pancreatitis, bowel obstruction, meningitis. Vital signs unremarkable. Physical exam as noted above.   Workup initiated in triage.   I independently reviewed and interpreted the patient's labs. Urinalysis pending. CBC without leukocytosis.  Hemoglobin shows mild anemia 11.5.  CMP shows sodium 134, glucose of 214.  Creatinine at 1.13, does appear slightly elevated from patient's baseline around 0.8.  Calcium  8.7.  AST at 84 with an alk phos of 138.  No other electrolyte or LFT abnormality.  DG pelvis  1. No acute findings. 2. Minimal symmetric degenerative change of the hips.   DG lumbar  1. No acute findings. 2. Mild spondylosis of the lumbar spine.   On previous chart evaluation, patient has a history of chronic lower back pain, polyarthralgia, fibromyalgia and is seen by pain management.  She takes  Percocet 10 at home with last fill being 03-22-2024.  Reports that she did not take her medication this morning.  She was  given oxycodone  5 in triage, will order her another oxycodone  5 as well as lidocaine  patch and baclofen .  Patient does not have any red flag symptoms.  Denies any fecal or urinary incontinence or difficulty, saddle anesthesia, IV drug use, or any fevers.  Denies any recent falls or trauma.  Reports it is the same location as her chronic pain just feels worse.  Denies any new activities.  Nonradiating.  Denies any chest pain or abdominal pain.  She is neuro vastly intact distally with palpable pulses.  The patient does have a history of aneurysm of the thoracic aorta however patient is not having any radiating of pain nor she have any chest pain, shortness of breath or any abdominal pain and she has palpable distal pulses.  She appears in no acute distress and is texting on her phone lying on the stretcher.  Her pain is reproducible, I doubt any dissection or worsening aneurysm.  Will need trial for ambulation.  If patient is able to ambulate, will need follow-up with her pain management clinic for further prescription.  3:18 PM Care of Idona Stach  transferred to PA Harris at the end of my shift as the patient will require reassessment once labs/imaging have resulted. Patient presentation, ED course, and plan of care discussed with review of all pertinent labs and imaging. Please see his/her note for further details regarding further ED course and disposition. Plan at time of handoff is follow up with urinalysis and ambulation trial. This may be altered or completely changed at the discretion of the oncoming team pending results of further workup.  I discussed this case with my attending physician who cosigned this note including patient's presenting symptoms, physical exam, and planned diagnostics and interventions. Attending physician stated agreement with plan or made changes to plan  which were implemented.   Portions of this report may have been transcribed using voice recognition software. Every effort was made to ensure accuracy; however, inadvertent computerized transcription errors may be present.    Final diagnoses:  None    ED Discharge Orders     None          Bernis Ernst, [place] 04/18/24 1544  "

## 2024-04-18 NOTE — ED Provider Triage Note (Signed)
 Emergency Medicine Provider Triage Evaluation Note  Tammy Strickland , a 64 y.o. female  was evaluated in triage.  Pt complains of severe low back pain.  Review of Systems  Positive: Chronic bilateral knee pains but woke up with severe low back pain this morning that is 30 out of 10 Negative: Dysuria, hematuria, vaginal symptoms, abdominal pain, chest pain, upper back pain, or new leg pains  Physical Exam  BP 113/85 (BP Location: Right Wrist)   Pulse 65   Temp 98 F (36.7 C) (Oral)   Resp (!) 21   SpO2 94%  Gen:   Awake, no distress   Resp:  Normal effort  MSK:   Moves extremities without difficulty  Other:  Tenderness across low back with muscle spasms.  Medical Decision Making  Medically screening exam initiated at 8:52 AM.  Appropriate orders placed.  Tammy Strickland was informed that the remainder of the evaluation will be completed by another provider, this initial triage assessment does not replace that evaluation, and the importance of remaining in the ED until their evaluation is complete.  Tammy Strickland is a 64 y.o. female with a past medical history significant for COPD, CHF, hypertension, diabetes, CAD, previous TIA, stroke, osteoporosis, and GERD who presents with severe back pain.  According to patient, she woke up this morning with severe, 30 out of 10 pain in her low back.  It does not radiate or down her legs.  She reports no numbness or weakness in her legs.  She reports some chronic knee pains that are unchanged.  She denies any UTI symptoms with no dysuria, hematuria, or frequency.  She denies trauma.  She denies rashes to suggest shingles.  She reports no pain in the abdomen or chest.  It is all in her back.  This is new.  She denied any injury to it to her knowledge.  On exam, patient is tearful with pain.  Abdomen was nontender.  Chest nontender and lungs were clear.  Patient has intact sensation and strength in lower extremities.  Patient has diffuse tenderness  across her low back with what feels to be clear muscle spasms paraspinally bilaterally.  Also low back posterior pelvis area also tender.  Patient will get some screening x-rays.  No history of previous surgery and no reported initial trauma so we will hold on CT initially.  Will get some screening labs as well and check urinalysis as she did have some tenderness in her CVA areas.  Will give her a pain pill.  Anticipate assessment when she gets seen by provider for further management.    Trigg Delarocha, Lonni PARAS, MD 04/18/24 [phone]

## 2024-04-18 NOTE — ED Notes (Signed)
 Pt. Attempted to give urine specimen, sat on toilet for 20 minutes, unable to urinate.  Pt. Stated, the last time I pee was yesterday.   Reported to [facility] .

## 2024-04-18 NOTE — Telephone Encounter (Signed)
 Tammy Strickland was in the ED today and missed her appt. She is asking Fidela to refill her medication please.

## 2024-04-18 NOTE — ED Provider Notes (Signed)
 Patient taken in signout from Tammy Strickland. 64 year old female with history of chronic back pain fibromyalgia and peripheral neuropathy along with degenerative disc disease who presents with acute on chronic back pain no red flag symptoms.  Patient currently states that her pain is too severe to walk.  Current plan at this time is to try 2 mg of IM Dilaudid  and reassess.  She denies any new injuries or other triggers for this new pain. Physical Exam  BP 135/88   Pulse 60   Temp 98.4 F (36.9 C)   Resp 14   SpO2 94%   Physical Exam  Procedures  Procedures  ED Course / MDM    Medical Decision Making Amount and/or Complexity of Data Reviewed Labs: ordered.  Risk Prescription drug management.   Patient's pain is improved after getting IM Dilaudid .  Labs reviewed no acute findings.  She has chronic pain meds and can follow-up with her pain specialist for increased pain control needs.  She presented was appropriate for discharge at this time.       Tammy Chroman, PA-C 04/18/24 1645    Tammy Sid SAILOR, MD 04/18/24 1943

## 2024-04-18 NOTE — ED Notes (Signed)
 Pt provided with discharge and follow up instructions, pt verbalized understanding.  VSS, pt transported out of ED w/ all paperwork and belongings in NAD w/ family.

## 2024-04-18 NOTE — ED Notes (Signed)
 Pt able to ambulate with FWW from [facility] at end of stretcher to head of stretcher

## 2024-04-18 NOTE — ED Notes (Signed)
 Pt bladder scanned X 4 per MD verbal order, 0 ml all 4 times. MD made aware. Pt states she has not urinated since last night

## 2024-04-18 NOTE — ED Notes (Addendum)
 Pt transferred to [facility] w/ standby assist to go to bathroom

## 2024-04-18 NOTE — Discharge Instructions (Signed)
 You were seen in the emergency department for acute on chronic back pain. You should follow-up with your pain management specialist for increased pain meds at this time. SEEK IMMEDIATE MEDICAL ATTENTION IF: New numbness, tingling, weakness, or problem with the use of your arms or legs.  Severe back pain not relieved with medications.  Change in bowel or bladder control.  Increasing pain in any areas of the body (such as chest or abdominal pain).  Shortness of breath, dizziness or fainting.  Nausea (feeling sick to your stomach), vomiting, fever, or sweats.

## 2024-04-18 NOTE — ED Triage Notes (Signed)
 Pt BIB EMS from home with c/o lower back pain started at 0600 this morning, feels like something's tight. Hx of CVA. Also c/o neck pain  142 palp 96% room air HR 68 CBG 238

## 2024-04-19 ENCOUNTER — Encounter: Payer: Self-pay | Admitting: Registered Nurse

## 2024-04-19 ENCOUNTER — Ambulatory Visit: Payer: Self-pay

## 2024-04-19 ENCOUNTER — Encounter: Attending: Registered Nurse | Admitting: Registered Nurse

## 2024-04-19 VITALS — BP 138/86 | HR 67 | Ht 66.0 in | Wt 268.0 lb

## 2024-04-19 DIAGNOSIS — Z5181 Encounter for therapeutic drug level monitoring: Secondary | ICD-10-CM | POA: Diagnosis not present

## 2024-04-19 DIAGNOSIS — M545 Low back pain, unspecified: Secondary | ICD-10-CM | POA: Diagnosis not present

## 2024-04-19 DIAGNOSIS — M1711 Unilateral primary osteoarthritis, right knee: Secondary | ICD-10-CM

## 2024-04-19 DIAGNOSIS — G894 Chronic pain syndrome: Secondary | ICD-10-CM

## 2024-04-19 DIAGNOSIS — M1712 Unilateral primary osteoarthritis, left knee: Secondary | ICD-10-CM

## 2024-04-19 DIAGNOSIS — G629 Polyneuropathy, unspecified: Secondary | ICD-10-CM

## 2024-04-19 DIAGNOSIS — M2550 Pain in unspecified joint: Secondary | ICD-10-CM | POA: Diagnosis not present

## 2024-04-19 DIAGNOSIS — Z79891 Long term (current) use of opiate analgesic: Secondary | ICD-10-CM

## 2024-04-19 DIAGNOSIS — M7061 Trochanteric bursitis, right hip: Secondary | ICD-10-CM

## 2024-04-19 DIAGNOSIS — M7062 Trochanteric bursitis, left hip: Secondary | ICD-10-CM | POA: Diagnosis not present

## 2024-04-19 DIAGNOSIS — G8929 Other chronic pain: Secondary | ICD-10-CM | POA: Diagnosis present

## 2024-04-19 LAB — URINE CULTURE: Culture: NO GROWTH

## 2024-04-19 MED ORDER — OXYCODONE-ACETAMINOPHEN 10-325 MG PO TABS: 1.0000 | ORAL_TABLET | Freq: Four times a day (QID) | ORAL | 0 refills | Status: AC | PRN

## 2024-04-19 MED ORDER — OXYCODONE-ACETAMINOPHEN 10-325 MG PO TABS: 1.0000 | ORAL_TABLET | Freq: Four times a day (QID) | ORAL | 0 refills | Status: DC | PRN

## 2024-04-19 NOTE — Telephone Encounter (Signed)
 Left message for patient.  She needs to be seen.

## 2024-04-19 NOTE — Telephone Encounter (Signed)
 Spoke with patient and appointment made with [facility] for Friday.

## 2024-04-19 NOTE — Telephone Encounter (Signed)
 FYI Only or Action Required?: Action required by provider: patient requesting antibiotics for boil on left hip and burns on right foot .  Patient was last seen in primary care on 02/22/2024 by Geofm Glade PARAS, MD.  Called Nurse Triage reporting Burn.  Symptoms began 1 week for the burn, 1  day for boil left hip.  Interventions attempted: Nothing.  Symptoms are: stable.  Triage Disposition: See Physician Within 24 Hours  Patient/caregiver understands and will follow disposition?: No, wishes to speak with PCP                     Reason for Disposition  [1] Boil AND [2] diabetes mellitus or weak immune system (e.g., HIV positive, cancer chemo, splenectomy, organ transplant, chronic steroids)  Answer Assessment - Initial Assessment Questions Patient reports dropped a whole pot of hot tea on right  foot last week. Thought would be gone now. There is blistering filled with fluid , 2 two with blister and the side of feet outside blisters  too.  Pain 7/10 Has surgery  1/21 knee replacement . Diabetic.  Boil on left hip, enacted not draining and wants bactrim  or another antibiotic that starts with C. 7/10  No fever. Has chronic back pain , went to ER yesterday.  Wants antibiotics to pharmacy for this to pharmacy on file.    1. APPEARANCE of BOIL: What does the boil look like?      Smaller than ping pong ball no redness not draining  2. LOCATION: Where is the boil located?      Left  hip 3. NUMBER: How many boils are there?      1 on left hip   5. ONSET: When did the boil start?     Noticed this last night in shower  6. PAIN: Is there any pain? If Yes, ask: How bad is the pain?   (Scale 1-10; or mild, moderate, severe)     7/10  7. FEVER: Do you have a fever? If Yes, ask: What is it, how was it measured, and when did it start?      Denies 8. SOURCE: Have you been around anyone with boils or other Staph infections? Have you ever had boils before?      Reports having these before gets antibiotics  9. OTHER SYMPTOMS: Do you have any other symptoms? (e.g., shaking chills, weakness, rash elsewhere on body)  Also mentions burns with blisters on right foot ,related  to dropping hot tea on foot last week, blisters intact was seen in ER yesterday chronic lower back pain reporting the ER did look at burn and said to not pop blisters and ,monitor      Patient denies the following  chest pain , shortness of breath , vomting, fever rash/redness  Protocols used: Boil (Skin Abscess)-A-AH Copied from CRM #[id number]. Topic: Clinical - Red Word Triage >> Apr 19, 2024 11:34 AM Tinnie BROCKS wrote: Red Word that prompted transfer to Nurse Triage: Spilled hot tea on foot, blisters are popping up

## 2024-04-19 NOTE — Progress Notes (Signed)
 "  Subjective:    Patient ID: Tammy Strickland, female    DOB: 31-Jan-1961, 64 y.o.   MRN: [id number]  HPI: Tammy Strickland is a 64 y.o. female who returns for follow up appointment for chronic pain and medication refill. She states her pain is located in her lower back, bilateral hips and bilateral knee pain R>L. Also reports right foot pain and bilateral hand and feet pain with tingling and burning. She rates her pain 10. Her current exercise regime is walking short distances with walker and performing stretching exercises.  Tammy Strickland reports on 04/09/2024, she was boiling some tea, went to pick up the pot, she states they were new pots and she disn't realize the handle had a release button, the handle came off and teal landed on her right foot pain. She was evaluated in ED yesterday, for back pain and she reports they ebvaluated her right foot pain.  Right foot ist and 2nd digit with swelling, she reports she wil.lcall her podiatrist to schedule an appointment.   She was also instructed to call her orthopedist regarding the above, since she is scheduled for TKR on 05/03/2024, she verbalizes understanding.   She was evaluated at [facility] ED on 04/18/2024, for lower back pain, note was reviewed.   Tammy Strickland  equivalent is 60.00 MME.   Oral Swab was Performed Today     Pain Inventory Average Pain 9 Pain Right Now 10 My pain is sharp, burning, stabbing, tingling, and aching  In the last 24 hours, has pain interfered with the following? General activity 8 Relation with others 8 Enjoyment of life 10 What TIME of day is your pain at its worst? morning , daytime, evening, and night Sleep (in general) Poor  Pain is worse with: walking, bending, sitting, inactivity, standing, and some activites Pain improves with: rest, therapy/exercise, and medication Relief from Meds: 6  Family History  Problem Relation Age of Onset   Hyperlipidemia Mother    Hypertension Mother    Stroke  Mother    Thyroid  disease Mother    Heart attack Mother    Hyperlipidemia Father    Hypertension Father    Stroke Father    Heart attack Father    Hypertension Sister    Stroke Sister    Thyroid  disease Sister    Breast cancer Sister    Crohn's disease Sister    Hypertension Sister    Hypertension Brother    Diabetes Brother    Hypertension Brother    Social History   Socioeconomic History   Marital status: Single    Spouse name: n/a   Number of children: 3   Years of education: 12+   Highest education level: 11th grade  Occupational History   Occupation: disabled-falling, doesn't recall name of toxin    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  Tobacco Use   Smoking status: Every Day    Current packs/day: 0.50    Average packs/day: 0.5 packs/day for 35.0 years (17.5 ttl pk-yrs)    Types: Cigarettes   Smokeless tobacco: Never   Tobacco comments:    referred  to smoking  cessation  classes at  [place] Use   Vaping status: Never Used  Substance and Sexual Activity   Alcohol use: Yes    Comment: occasional   Drug use: No   Sexual activity: Not Currently    Partners: Female    Birth control/protection: Surgical    Comment: hysterectomy  Other Topics Concern  Not on file  Social History Narrative   Moved to [place] from [place], [place] February 2017, to help her daughter.   Lives with her daughter.   Sons live in [place] and [place].   She reports that there were originally 17 children in her family (she is the youngest), the oldest are deceased, some prior to her birth, and she isn't sure which were female/female or how they died.      Lives alone, son stays across the street-2025   Social Drivers of Health   Tobacco Use: High Risk (04/18/2024)   Patient History    Smoking Tobacco Use: Every Day    Smokeless Tobacco Use: Never    Passive Exposure: Not on file  Financial Resource Strain: Low Risk (07/26/2023)   Overall Financial Resource  Strain (CARDIA)    Difficulty of Paying Living Expenses: Not very hard  Food Insecurity: No Food Insecurity (07/26/2023)   Hunger Vital Sign    Worried About Running Out of Food in the Last Year: Never true    Ran Out of Food in the Last Year: Never true  Transportation Needs: No Transportation Needs (07/26/2023)   PRAPARE - Administrator, Civil Service (Medical): No    Lack of Transportation (Non-Medical): No  Physical Activity: Insufficiently Active (07/26/2023)   Exercise Vital Sign    Days of Exercise per Week: 1 day    Minutes of Exercise per Session: 40 min  Stress: Stress Concern Present (07/26/2023)   [employer] of Occupational Health - Occupational Stress Questionnaire    Feeling of Stress : Very much  Social Connections: Unknown (07/26/2023)   Social Connection and Isolation Panel    Frequency of Communication with Friends and Family: Three times a week    Frequency of Social Gatherings with Friends and Family: Once a week    Attends Religious Services: More than 4 times per year    Active Member of [employer] or Organizations: No    Attends Banker Meetings: Never    Marital Status: Patient declined  Recent Concern: Social Connections - Socially Isolated (06/16/2023)   Social Connection and Isolation Panel    Frequency of Communication with Friends and Family: Twice a week    Frequency of Social Gatherings with Friends and Family: Never    Attends Religious Services: 1 to 4 times per year    Active Member of Clubs or Organizations: No    Attends Banker Meetings: Never    Marital Status: Never married  Depression (PHQ2-9): Medium Risk (02/22/2024)   Depression (PHQ2-9)    PHQ-2 Score: 9  Alcohol Screen: Low Risk (07/26/2023)   Alcohol Screen    Last Alcohol Screening Score (AUDIT): 1  Housing: Low Risk (07/26/2023)   Housing Stability Vital Sign    Unable to Pay for Housing in the Last Year: No    Number of Times Moved in the Last  Year: 0    Homeless in the Last Year: No  Utilities: Not At Risk (06/16/2023)   AHC Utilities    Threatened with loss of utilities: No  Health Literacy: Adequate Health Literacy (06/16/2023)   B1300 Health Literacy    Frequency of need for help with medical instructions: Never   Past Surgical History:  Procedure Laterality Date   ABDOMINAL HYSTERECTOMY  1989   ovaries left   APPENDECTOMY  1989   BREAST LUMPECTOMY WITH RADIOACTIVE SEED LOCALIZATION Left 06/14/2019   Procedure: LEFT BREAST LUMPECTOMY WITH RADIOACTIVE SEED LOCALIZATION  X 2;  Surgeon: Vernetta Berg, MD;  Location: [facility] OR;  Service: General;  Laterality: Left;  LMA   CHOLECYSTECTOMY  1989   IR GENERIC HISTORICAL  11/07/2015   IR ANGIO INTRA EXTRACRAN SEL COM CAROTID INNOMINATE BILAT MOD SED 11/07/2015 Thyra Nash, MD [facility]INTER[facility]   IR GENERIC HISTORICAL  11/07/2015   IR ANGIO VERTEBRAL SEL SUBCLAVIAN INNOMINATE UNI R MOD SED 11/07/2015 Thyra Nash, MD [facility]   IR GENERIC HISTORICAL  11/07/2015   IR ANGIO VERTEBRAL SEL VERTEBRAL UNI L MOD SED 11/07/2015 Thyra Nash, MD [facility]   IR GENERIC HISTORICAL  11/07/2015   IR ANGIOGRAM EXTREMITY LEFT 11/07/2015 Thyra Nash, MD [facility]   MASS EXCISION Left 03/17/2021   Procedure: EXCISION MASS,left middle finger ulnar border mass excision,left thumb volar retinacular ganglion cyst excision;  Surgeon: Alyse Agent, MD;  Location: [facility];  Service: Orthopedics;  Laterality: Left;   TONSILLECTOMY  2000   adenoids removed   TOTAL KNEE ARTHROPLASTY Left 03/12/2017   Procedure: LEFT TOTAL KNEE ARTHROPLASTY;  Surgeon: Vernetta Lonni GRADE, MD;  Location: [facility] ORS;  Service: Orthopedics;  Laterality: Left;  Adductor Block   TRIGGER FINGER RELEASE Left 03/17/2021   Procedure: left trigger thumb release;  Surgeon: Alyse Agent, MD;  Location: [facility];  Service: Orthopedics;  Laterality: Left;  with local anesthesia    Past Surgical History:  Procedure Laterality Date   ABDOMINAL HYSTERECTOMY  1989   ovaries left   APPENDECTOMY  1989   BREAST LUMPECTOMY WITH RADIOACTIVE SEED LOCALIZATION Left 06/14/2019   Procedure: LEFT BREAST LUMPECTOMY WITH RADIOACTIVE SEED LOCALIZATION X 2;  Surgeon: Vernetta Berg, MD;  Location: [facility] OR;  Service: General;  Laterality: Left;  LMA   CHOLECYSTECTOMY  1989   IR GENERIC HISTORICAL  11/07/2015   IR ANGIO INTRA EXTRACRAN SEL COM CAROTID INNOMINATE BILAT MOD SED 11/07/2015 Thyra Nash, MD [facility]   IR GENERIC HISTORICAL  11/07/2015   IR ANGIO VERTEBRAL SEL SUBCLAVIAN INNOMINATE UNI R MOD SED 11/07/2015 Thyra Nash, MD [facility]   IR GENERIC HISTORICAL  11/07/2015   IR ANGIO VERTEBRAL SEL VERTEBRAL UNI L MOD SED 11/07/2015 Thyra Nash, MD [facility]   IR GENERIC HISTORICAL  11/07/2015   IR ANGIOGRAM EXTREMITY LEFT 11/07/2015 Thyra Nash, MD [facility]   MASS EXCISION Left 03/17/2021   Procedure: EXCISION MASS,left middle finger ulnar border mass excision,left thumb volar retinacular ganglion cyst excision;  Surgeon: Alyse Agent, MD;  Location: [facility];  Service: Orthopedics;  Laterality: Left;   TONSILLECTOMY  2000   adenoids removed   TOTAL KNEE ARTHROPLASTY Left 03/12/2017   Procedure: LEFT TOTAL KNEE ARTHROPLASTY;  Surgeon: Vernetta Lonni GRADE, MD;  Location: [facility];  Service: Orthopedics;  Laterality: Left;  Adductor Block   TRIGGER FINGER RELEASE Left 03/17/2021   Procedure: left trigger thumb release;  Surgeon: Alyse Agent, MD;  Location[facility];  Service: Orthopedics;  Laterality: Left;  with local anesthesia   Past Medical History:  Diagnosis Date   Ambulates with cane 03/13/2021   Aortic atherosclerosis    Arthritis    Coronary artery disease    Diabetes mellitus without complication (HCC)    type 2    Fibromyalgia    Gallstones    GERD (gastroesophageal reflux disease)     HLD (hyperlipidemia)    Hypertension    Neuromuscular disorder (HCC)    Osteoporosis    Pancreatitis 05/27/2019   none since  Peripheral neuropathy 03/13/2021   all over pt pt   Shingles    TIA (transient ischemic attack)    Uses walker 03/13/2021   Wears glasses    BP 138/86   Pulse 67   Ht 5' 6 (1.676 m)   Wt 268 lb (121.6 kg)   SpO2 94%   BMI 43.26 kg/m   Opioid Risk Score:   Fall Risk Score:  `1  Depression screen [facility] 2/9     02/22/2024   11:21 AM 12/20/2023    8:39 AM 10/18/2023    8:54 AM 08/18/2023    8:32 AM 06/21/2023    8:28 AM 06/16/2023    9:02 AM 11/25/2022    9:28 AM  Depression screen PHQ 2/9  Decreased Interest 2 1 2 3 3  0 0  Down, Depressed, Hopeless 0 1 3 3 3  0 0  PHQ - 2 Score 2 2 5 6 6  0 0  Altered sleeping 3  3   2    Tired, decreased energy 2  3   0   Change in appetite 2  3   0   Feeling bad or failure about yourself  0  2   0   Trouble concentrating 0  0   0   Moving slowly or fidgety/restless 0  1   0   Suicidal thoughts 0  0   0   PHQ-9 Score 9  17    2     Difficult doing work/chores Somewhat difficult  Somewhat difficult   Not difficult at all      Data saved with a previous flowsheet row definition     Review of Systems  Musculoskeletal:  Positive for back pain.       B/L shoulder and leg pain  All other systems reviewed and are negative.      Objective:   Physical Exam Vitals and nursing note reviewed.  Constitutional:      Appearance: Normal appearance.  Cardiovascular:     Rate and Rhythm: Normal rate and regular rhythm.     Pulses: Normal pulses.     Heart sounds: Normal heart sounds.  Pulmonary:     Effort: Pulmonary effort is normal.     Breath sounds: Normal breath sounds.  Musculoskeletal:     Comments: Normal Muscle Bulk and Muscle Testing Reveals:  Upper Extremities: Decreased ROM 45 Degrees  and Muscle Strength  5/5 Lumbar Hypersensitivity Bilateral Greater Trochanter Tenderness Lower Extremities: Right:  Decreased ROM and Muscle Strength 5/5 Right Lower Extremity Flexion Produces Pain into her Right Lower Extremity  Left Lower Extremity: Full ROM and Muscle Strength 5/5 Arises from Table slowly using walker for support Antalgic  Gait     Skin:    General: Skin is warm and dry.     Comments: Right 1st and 2nd digit with swelling noted, no blisters noted   Neurological:     Mental Status: She is alert and oriented to person, place, and time.  Psychiatric:        Mood and Affect: Mood normal.        Behavior: Behavior normal.          Assessment & Plan:  Chronic Left Shoulder Pain: No complaints today. Ortho Following. Continue HEP as Tolerated. Continue to Monitor. 101/10/2024 Chronic Bilateral Lower Back Pain without sciatica/Left Lumbar Radiculitis  Continue HEP as Tolerated. Continue current medication regimen. Continue to monitor. 04/19/2024 Bilateral Knee with Primary Osteoarthritis: Ortho Following.  She is scheduled  for  TKR  on Continue current medication regimen. Continue to Monitor. 04/19/2024 Polyarthralgia: Continue HEP as Tolerated. Continue to monitor. 04/19/2024 Chronic Pain Syndrome: Refilled: Oxycodone  10/325mg  one tablet every 6 hours as needed for pain #120. Second script sent for the following month We will continue the opioid monitoring program, this consists of regular clinic visits, examinations, urine drug screen, pill counts as well as use of [place]  Controlled Substance Reporting system. A 12 month History has been reviewed on the [place]  Controlled Substance Reporting System on 04/19/2024 Fall on steps: No Falls this month Educated on [employer]. She verbalizes understanding. 04/19/2024 Bilateral Greater Trochanter Bursitis: Continue HEP as Tolerated. Continue to Monitor, 04/19/2024 8. Polyneuropathy: Continue current medication regimen. Continue to monitor. 04/19/2024  F/U in 2 months     "

## 2024-04-20 ENCOUNTER — Ambulatory Visit: Payer: Self-pay | Admitting: Student

## 2024-04-20 ENCOUNTER — Telehealth: Payer: Self-pay

## 2024-04-20 NOTE — Telephone Encounter (Signed)
 PA CREATED IN COVER MY MED WITH MEDICARE

## 2024-04-20 NOTE — Progress Notes (Signed)
 Anesthesia Review:  PCP: Glade Hope LOV 02/22/24  Cardiologist :  PPM/ ICD: Device Orders: Rep Notified:  Chest x-ray : EKG : Echo : 2017  Stress test: Cardiac Cath :   Activity level:  Sleep Study/ CPAP : Fasting Blood Sugar :      / Checks Blood Sugar -- times a day:     DM- type  Hgba1c-   Blood Thinner/ Instructions /Last Dose: ASA / Instructions/ Last Dose :    04/18/24-cbc/diff and cmp and u/a and culture done  04/18/24- In ED with low back pain  04/19/2024- OV DR Debby

## 2024-04-20 NOTE — Telephone Encounter (Signed)
 PA for Oxycodone  10-325 mg per pharmacy. No PA is needed she can pick up the Rx on tomorrow.  Patient has Medicare Part D.

## 2024-04-20 NOTE — Patient Instructions (Signed)
 SURGICAL WAITING ROOM VISITATION  Patients having surgery or a procedure may have no more than 2 support people in the waiting area - these visitors may rotate.    Children ages 35 and under will not be able to visit patients in [facility] under most circumstances.   Visitors with respiratory illnesses are discouraged from visiting and should remain at home.  If the patient needs to stay at the hospital during part of their recovery, the visitor guidelines for inpatient rooms apply. Pre-op nurse will coordinate an appropriate time for 1 support person to accompany patient in pre-op.  This support person may not rotate.    Please refer to the [facility] website for the visitor guidelines for Inpatients (after your surgery is over and you are in a regular room).       Your procedure is scheduled on:    Report to [facility] Main Entrance    Report to admitting at AM   Call this number if you have problems the morning of surgery [phone]   Do not eat food :After Midnight.   After Midnight you may have the following liquids until ______ AM/ PM DAY OF SURGERY  Water  Non-Citrus Juices (without pulp, NO RED-Apple, White grape, White cranberry) Black Coffee (NO MILK/CREAM OR CREAMERS, sugar ok)  Clear Tea (NO MILK/CREAM OR CREAMERS, sugar ok) regular and decaf                             Plain Jell-O (NO RED)                                           Fruit ices (not with fruit pulp, NO RED)                                     Popsicles (NO RED)                                                               Sports drinks like Gatorade (NO RED)              Drink 2 Ensure/G2 drinks AT 10:00 PM the night before surgery.        The day of surgery:  Drink ONE (1) Pre-Surgery Clear Ensure or G2 at AM the morning of surgery. Drink in one sitting. Do not sip.  This drink was given to you during your hospital  pre-op appointment visit. Nothing else to drink after  completing the  Pre-Surgery Clear Ensure or G2.          If you have questions, please contact your surgeons office.   FOLLOW BOWEL PREP AND ANY ADDITIONAL PRE OP INSTRUCTIONS YOU RECEIVED FROM YOUR SURGEON'S OFFICE!!!     Oral Hygiene is also important to reduce your risk of infection.                                    Remember - BRUSH YOUR  TEETH THE MORNING OF SURGERY WITH YOUR REGULAR TOOTHPASTE  DENTURES WILL BE REMOVED PRIOR TO SURGERY PLEASE DO NOT APPLY Poly grip OR ADHESIVES!!!   Do NOT smoke after Midnight   Stop all vitamins and herbal supplements 7 days before surgery.   Take these medicines the morning of surgery with A SIP OF WATER :   DO NOT TAKE ANY ORAL DIABETIC MEDICATIONS DAY OF YOUR SURGERY  Bring CPAP mask and tubing day of surgery.                              You may not have any metal on your body including hair pins, jewelry, and body piercing             Do not wear make-up, lotions, powders, perfumes/cologne, or deodorant  Do not wear nail polish including gel and S&S, artificial/acrylic nails, or any other type of covering on natural nails including finger and toenails. If you have artificial nails, gel coating, etc. that needs to be removed by a nail salon please have this removed prior to surgery or surgery may need to be canceled/ delayed if the surgeon/ anesthesia feels like they are unable to be safely monitored.   Do not shave  48 hours prior to surgery.               Men may shave face and neck.   Do not bring valuables to the hospital. [facility] IS NOT             RESPONSIBLE   FOR VALUABLES.   Contacts, glasses, dentures or bridgework may not be worn into surgery.   Bring small overnight bag day of surgery.   DO NOT BRING YOUR HOME MEDICATIONS TO THE HOSPITAL. PHARMACY WILL DISPENSE MEDICATIONS LISTED ON YOUR MEDICATION LIST TO YOU DURING YOUR ADMISSION IN THE HOSPITAL!    Patients discharged on the day of surgery will not be  allowed to drive home.  Someone NEEDS to stay with you for the first 24 hours after anesthesia.   Special Instructions: Bring a copy of your healthcare power of attorney and living will documents the day of surgery if you haven't scanned them before.              Please read over the following fact sheets you were given: IF YOU HAVE QUESTIONS ABOUT YOUR PRE-OP INSTRUCTIONS PLEASE CALL [phone].   If you received a COVID test during your pre-op visit  it is requested that you wear a mask when out in public, stay away from anyone that may not be feeling well and notify your surgeon if you develop symptoms. If you test positive for Covid or have been in contact with anyone that has tested positive in the last 10 days please notify you surgeon.      Pre-operative 4 CHG Bath Instructions   You can play a key role in reducing the risk of infection after surgery. Your skin needs to be as free of germs as possible. You can reduce the number of germs on your skin by washing with CHG (chlorhexidine  gluconate) soap before surgery. CHG is an antiseptic soap that kills germs and continues to kill germs even after washing.   DO NOT use if you have an allergy to chlorhexidine /CHG or antibacterial soaps. If your skin becomes reddened or irritated, stop using the CHG and notify one of our RNs at [phone].   Please shower with  the CHG soap starting 4 days before surgery using the following schedule:     Please keep in mind the following:  DO NOT shave, including legs and underarms, starting the day of your first shower.   You may shave your face at any point before/day of surgery.  Place clean sheets on your bed the day you start using CHG soap. Use a clean washcloth (not used since being washed) for each shower. DO NOT sleep with pets once you start using the CHG.   CHG Shower Instructions:  If you choose to wash your hair and private area, wash first with your normal shampoo/soap.  After you use  shampoo/soap, rinse your hair and body thoroughly to remove shampoo/soap residue.  Turn the water  OFF and apply about 3 tablespoons (45 ml) of CHG soap to a CLEAN washcloth.  Apply CHG soap ONLY FROM YOUR NECK DOWN TO YOUR TOES (washing for 3-5 minutes)  DO NOT use CHG soap on face, private areas, open wounds, or sores.  Pay special attention to the area where your surgery is being performed.  If you are having back surgery, having someone wash your back for you may be helpful. Wait 2 minutes after CHG soap is applied, then you may rinse off the CHG soap.  Pat dry with a clean towel  Put on clean clothes/pajamas   If you choose to wear lotion, please use ONLY the CHG-compatible lotions on the back of this paper.     Additional instructions for the day of surgery: DO NOT APPLY any lotions, deodorants, cologne, or perfumes.   Put on clean/comfortable clothes.  Brush your teeth.  Ask your nurse before applying any prescription medications to the skin.      CHG Compatible Lotions   Aveeno Moisturizing lotion  Cetaphil Moisturizing Cream  Cetaphil Moisturizing Lotion  Clairol Herbal Essence Moisturizing Lotion, Dry Skin  Clairol Herbal Essence Moisturizing Lotion, Extra Dry Skin  Clairol Herbal Essence Moisturizing Lotion, Normal Skin  Curel Age Defying Therapeutic Moisturizing Lotion with Alpha Hydroxy  Curel Extreme Care Body Lotion  Curel Soothing Hands Moisturizing Hand Lotion  Curel Therapeutic Moisturizing Cream, Fragrance-Free  Curel Therapeutic Moisturizing Lotion, Fragrance-Free  Curel Therapeutic Moisturizing Lotion, Original Formula  Eucerin Daily Replenishing Lotion  Eucerin Dry Skin Therapy Plus Alpha Hydroxy Crme  Eucerin Dry Skin Therapy Plus Alpha Hydroxy Lotion  Eucerin Original Crme  Eucerin Original Lotion  Eucerin Plus Crme Eucerin Plus Lotion  Eucerin TriLipid Replenishing Lotion  Keri Anti-Bacterial Hand Lotion  Keri Deep Conditioning Original Lotion Dry  Skin Formula Softly Scented  Keri Deep Conditioning Original Lotion, Fragrance Free Sensitive Skin Formula  Keri Lotion Fast Absorbing Fragrance Free Sensitive Skin Formula  Keri Lotion Fast Absorbing Softly Scented Dry Skin Formula  Keri Original Lotion  Keri Skin Renewal Lotion Keri Silky Smooth Lotion  Keri Silky Smooth Sensitive Skin Lotion  Nivea Body Creamy Conditioning Oil  Nivea Body Extra Enriched Teacher, Adult Education Moisturizing Lotion Nivea Crme  Nivea Skin Firming Lotion  NutraDerm 30 Skin Lotion  NutraDerm Skin Lotion  NutraDerm Therapeutic Skin Cream  NutraDerm Therapeutic Skin Lotion  ProShield Protective Hand Cream  Provon moisturizing lotion

## 2024-04-21 ENCOUNTER — Encounter: Payer: Self-pay | Admitting: Family Medicine

## 2024-04-21 ENCOUNTER — Ambulatory Visit: Admitting: Family Medicine

## 2024-04-21 VITALS — BP 142/90 | HR 60 | Temp 97.6°F | Ht 66.0 in | Wt 219.0 lb

## 2024-04-21 DIAGNOSIS — L0231 Cutaneous abscess of buttock: Secondary | ICD-10-CM

## 2024-04-21 DIAGNOSIS — K5909 Other constipation: Secondary | ICD-10-CM

## 2024-04-21 DIAGNOSIS — L03115 Cellulitis of right lower limb: Secondary | ICD-10-CM | POA: Diagnosis not present

## 2024-04-21 DIAGNOSIS — R600 Localized edema: Secondary | ICD-10-CM | POA: Diagnosis not present

## 2024-04-21 DIAGNOSIS — T25221A Burn of second degree of right foot, initial encounter: Secondary | ICD-10-CM

## 2024-04-21 MED ORDER — POLYETHYLENE GLYCOL 3350 17 GM/SCOOP PO POWD: 17.0000 g | Freq: Every day | ORAL | 0 refills | Status: AC

## 2024-04-21 MED ORDER — FLUCONAZOLE 150 MG PO TABS: ORAL_TABLET | ORAL | 0 refills | Status: AC

## 2024-04-21 MED ORDER — SULFAMETHOXAZOLE-TRIMETHOPRIM 800-160 MG PO TABS: 1.0000 | ORAL_TABLET | Freq: Two times a day (BID) | ORAL | 1 refills | Status: AC

## 2024-04-21 MED ORDER — SULFAMETHOXAZOLE-TRIMETHOPRIM 800-160 MG PO TABS: 1.0000 | ORAL_TABLET | Freq: Two times a day (BID) | ORAL | 0 refills | Status: DC

## 2024-04-21 NOTE — Patient Instructions (Signed)
 I have sent in an antibiotic for you to take 1 tablet by mouth twice a day for 10 days. Please eat with this medication, it can upset your stomach if you do not. Take all of the medication even if you are feeling better.  I have sent in miralax  as a prescription for you to use as needed for constipation.   Follow-up with me for new or worsening symptoms.

## 2024-04-21 NOTE — Progress Notes (Signed)
 "  Acute Office Visit  Subjective:     Patient ID: Tammy Strickland, female    DOB: [birth year], 64 y.o.   MRN: [id number]  Chief Complaint  Patient presents with   Blister    Boil on right hip and blister on right foot due to burn    HPI  Discussed the use of AI scribe software for clinical note transcription with the patient, who gave verbal consent to proceed.  History of Present Illness Tammy Strickland is a 64 year old female who presents with foot blisters and hip pain.  Foot blisters and pain - Blisters developed on the dorsal aspect of the toes two days ago from a thermal burn - One blister on the third toe has ruptured - Erythema present around the toenails - History of toenail removal - Unable to wear shoes due to pain from blisters  Right hip mass and pain - Palpable mass described as a 'ball' on the right hip - Concern for possible drainage from the area  Peripheral edema - Swelling present in the back and legs - Takes Lasix  for fluid retention  Chronic low back pain and neuromuscular symptoms - Chronic low back pain with burning sensation across the lower back extending from the top of the hip - Long-standing spinal stiffness - History of one episode of muscle weakness requiring assistance from family to move  Gastrointestinal symptoms - Acid gastritis with frequent gas, no associated pain - Takes medication for reflux, but Nexium is ineffective - Constipation present - Requesting a strong laxative similar to colonoscopy preparation     ROS Per HPI      Objective:    BP (!) 142/90   Pulse 60   Temp 97.6 F (36.4 C) (Oral)   Ht 5' 6 (1.676 m)   Wt 219 lb (99.3 kg)   SpO2 99%   BMI 35.35 kg/m    Physical Exam Vitals and nursing note reviewed.  Constitutional:      General: She is not in acute distress.    Appearance: She is obese.  HENT:     Head: Normocephalic and atraumatic.     Right Ear: External ear normal.     Left Ear: External  ear normal.     Nose: Nose normal.     Mouth/Throat:     Mouth: Mucous membranes are moist.     Pharynx: Oropharynx is clear.  Eyes:     Extraocular Movements: Extraocular movements intact.     Pupils: Pupils are equal, round, and reactive to light.  Cardiovascular:     Rate and Rhythm: Normal rate and regular rhythm.     Pulses: Normal pulses.     Heart sounds: Normal heart sounds.  Pulmonary:     Effort: Pulmonary effort is normal. No respiratory distress.     Breath sounds: Normal breath sounds. No wheezing, rhonchi or rales.  Musculoskeletal:     Cervical back: Normal range of motion.     Right lower leg: Edema (2+ pitting) present.     Left lower leg: Edema (2+ pitting) present.     Comments: Using 2 wheel walker  Lymphadenopathy:     Cervical: No cervical adenopathy.  Skin:    Findings: Lesion present.     Comments: See photos of right foot.  Lesion to L buttock, about 1 cm diameter, erythematous, tender, warm to touch with central pustule. No discharge or bleeding from the area.   Neurological:     General: No focal  deficit present.     Mental Status: She is alert and oriented to person, place, and time.  Psychiatric:        Mood and Affect: Mood normal.        Thought Content: Thought content normal.     No results found for any visits on 04/21/24.      Assessment & Plan:   Assessment and Plan Assessment & Plan Cellulitis of right lower extremity, Thermal burn of right foot, initial encounter, partial thickness Cellulitis with redness and swelling, prefers antibiotic management. - Concern for burn progressing to cellulitis - Complicated by type 2 diabetes - Prescribed Bactrim  with refill.  Chronic constipation - increased gas - likely related to Mounjaro  - miralax  to pharmacy  Edema of lower extremities Edema with swelling, no kidney issues, on Lasix . - Continue Lasix .     No orders of the defined types were placed in this encounter.    Meds  ordered this encounter  Medications   DISCONTD: sulfamethoxazole -trimethoprim  (BACTRIM  DS) 800-160 MG tablet    Sig: Take 1 tablet by mouth 2 (two) times daily for 10 days.    Dispense:  20 tablet    Refill:  0   sulfamethoxazole -trimethoprim  (BACTRIM  DS) 800-160 MG tablet    Sig: Take 1 tablet by mouth 2 (two) times daily for 10 days.    Dispense:  20 tablet    Refill:  1   polyethylene glycol powder (GLYCOLAX /MIRALAX ) 17 GM/SCOOP powder    Sig: Take 17 g by mouth daily. Dissolve 1 capful (17g) in 4-8 ounces of liquid and take by mouth daily.    Dispense:  238 g    Refill:  0   fluconazole  (DIFLUCAN ) 150 MG tablet    Sig: Take one tablet at the onset of symptoms, if still having symptoms in 3 days, take the second tablet.    Dispense:  2 tablet    Refill:  0    Return if symptoms worsen or fail to improve.  Corean LITTIE Ku, FNP  "

## 2024-04-21 NOTE — Progress Notes (Signed)
 Sent message, via epic in basket, requesting orders in epic from Careers adviser.

## 2024-04-23 LAB — DRUG TOX MONITOR 1 W/CONF, ORAL FLD
Amphetamines: NEGATIVE ng/mL
Barbiturates: NEGATIVE ng/mL
Benzodiazepines: NEGATIVE ng/mL
Buprenorphine: NEGATIVE ng/mL
Cocaine: NEGATIVE ng/mL
Codeine: NEGATIVE ng/mL
Cotinine: 8.3 ng/mL — ABNORMAL HIGH
Dihydrocodeine: NEGATIVE ng/mL
Fentanyl: NEGATIVE ng/mL
Heroin Metabolite: NEGATIVE ng/mL
Hydrocodone: NEGATIVE ng/mL
Hydromorphone: NEGATIVE ng/mL
MARIJUANA: NEGATIVE ng/mL
MDMA: NEGATIVE ng/mL
Meprobamate: NEGATIVE ng/mL
Methadone: NEGATIVE ng/mL
Morphine: NEGATIVE ng/mL
Nicotine Metabolite: POSITIVE ng/mL — AB
Norhydrocodone: NEGATIVE ng/mL
Noroxycodone: 16.3 ng/mL — ABNORMAL HIGH
Opiates: POSITIVE ng/mL — AB
Oxycodone: 157.6 ng/mL — ABNORMAL HIGH
Oxymorphone: NEGATIVE ng/mL
Phencyclidine: NEGATIVE ng/mL
Tapentadol: NEGATIVE ng/mL
Tramadol: NEGATIVE ng/mL
Zolpidem: NEGATIVE ng/mL

## 2024-04-23 LAB — DRUG TOX ALC METAB W/CON, ORAL FLD: Alcohol Metabolite: NEGATIVE ng/mL

## 2024-04-24 ENCOUNTER — Ambulatory Visit: Payer: Self-pay | Admitting: Student

## 2024-04-24 DIAGNOSIS — E1142 Type 2 diabetes mellitus with diabetic polyneuropathy: Secondary | ICD-10-CM

## 2024-04-25 ENCOUNTER — Encounter (HOSPITAL_COMMUNITY): Payer: Self-pay

## 2024-04-25 ENCOUNTER — Encounter (HOSPITAL_COMMUNITY)
Admission: RE | Admit: 2024-04-25 | Discharge: 2024-04-25 | Disposition: A | Source: Ambulatory Visit | Attending: Orthopedic Surgery

## 2024-04-25 ENCOUNTER — Other Ambulatory Visit: Payer: Self-pay

## 2024-04-25 VITALS — BP 123/74 | HR 57 | Temp 98.9°F | Resp 16 | Ht 66.0 in

## 2024-04-25 DIAGNOSIS — Z794 Long term (current) use of insulin: Secondary | ICD-10-CM | POA: Insufficient documentation

## 2024-04-25 DIAGNOSIS — E1142 Type 2 diabetes mellitus with diabetic polyneuropathy: Secondary | ICD-10-CM | POA: Insufficient documentation

## 2024-04-25 DIAGNOSIS — Z01818 Encounter for other preprocedural examination: Secondary | ICD-10-CM | POA: Insufficient documentation

## 2024-04-25 HISTORY — DX: Dyspnea, unspecified: R06.00

## 2024-04-25 LAB — SURGICAL PCR SCREEN
MRSA, PCR: NEGATIVE
Staphylococcus aureus: NEGATIVE

## 2024-04-25 LAB — GLUCOSE, CAPILLARY: Glucose-Capillary: 139 mg/dL — ABNORMAL HIGH (ref 70–99)

## 2024-04-25 LAB — HEMOGLOBIN A1C
Hgb A1c MFr Bld: 7.2 % — ABNORMAL HIGH (ref 4.8–5.6)
Mean Plasma Glucose: 159.94 mg/dL

## 2024-05-03 ENCOUNTER — Encounter (HOSPITAL_COMMUNITY): Admission: RE | Payer: Self-pay | Source: Ambulatory Visit

## 2024-05-03 ENCOUNTER — Ambulatory Visit (HOSPITAL_COMMUNITY): Admission: RE | Admit: 2024-05-03 | Source: Ambulatory Visit | Admitting: Orthopedic Surgery

## 2024-05-03 SURGERY — ARTHROPLASTY, KNEE, TOTAL, USING IMAGELESS COMPUTER-ASSISTED NAVIGATION
Anesthesia: Spinal | Site: Knee | Laterality: Right

## 2024-05-24 ENCOUNTER — Ambulatory Visit: Admitting: Internal Medicine

## 2024-06-15 ENCOUNTER — Encounter: Admitting: Registered Nurse

## 2024-06-16 ENCOUNTER — Ambulatory Visit
